# Patient Record
Sex: Female | Born: 1980 | Race: White | Hispanic: No | Marital: Married | Smoking: Never smoker
Health system: Southern US, Community
[De-identification: ages and names within clinical notes are randomized; demographics above are authoritative.]

## PROBLEM LIST (undated history)

## (undated) DIAGNOSIS — K7689 Other specified diseases of liver: Secondary | ICD-10-CM

## (undated) DIAGNOSIS — K5989 Other specified functional intestinal disorders: Secondary | ICD-10-CM

## (undated) DIAGNOSIS — K219 Gastro-esophageal reflux disease without esophagitis: Secondary | ICD-10-CM

## (undated) DIAGNOSIS — Z87440 Personal history of urinary (tract) infections: Secondary | ICD-10-CM

## (undated) DIAGNOSIS — F419 Anxiety disorder, unspecified: Secondary | ICD-10-CM

## (undated) HISTORY — DX: Personal history of urinary (tract) infections: Z87.440

## (undated) HISTORY — DX: Anxiety disorder, unspecified: F41.9

## (undated) HISTORY — DX: Gastro-esophageal reflux disease without esophagitis: K21.9

## (undated) HISTORY — PX: OTHER SURGICAL HISTORY: SHX169

---

## 2008-01-01 ENCOUNTER — Other Ambulatory Visit: Admission: RE | Admit: 2008-01-01 | Discharge: 2008-01-01 | Payer: Self-pay | Admitting: Obstetrics and Gynecology

## 2008-06-06 ENCOUNTER — Ambulatory Visit: Payer: Self-pay | Admitting: Family Medicine

## 2008-06-07 ENCOUNTER — Encounter: Payer: Self-pay | Admitting: Family Medicine

## 2008-06-10 ENCOUNTER — Encounter (INDEPENDENT_AMBULATORY_CARE_PROVIDER_SITE_OTHER): Payer: Self-pay | Admitting: *Deleted

## 2009-04-27 ENCOUNTER — Inpatient Hospital Stay (HOSPITAL_COMMUNITY): Admission: AD | Admit: 2009-04-27 | Discharge: 2009-04-30 | Payer: Self-pay | Admitting: Obstetrics and Gynecology

## 2009-04-27 ENCOUNTER — Encounter (INDEPENDENT_AMBULATORY_CARE_PROVIDER_SITE_OTHER): Payer: Self-pay | Admitting: Obstetrics and Gynecology

## 2009-08-25 ENCOUNTER — Other Ambulatory Visit: Admission: RE | Admit: 2009-08-25 | Discharge: 2009-08-25 | Payer: Self-pay | Admitting: Obstetrics and Gynecology

## 2010-11-20 LAB — CBC
HCT: 29.9 % — ABNORMAL LOW (ref 36.0–46.0)
HCT: 37.4 % (ref 36.0–46.0)
Hemoglobin: 10.2 g/dL — ABNORMAL LOW (ref 12.0–15.0)
MCHC: 33.9 g/dL (ref 30.0–36.0)
MCV: 89.9 fL (ref 78.0–100.0)
RBC: 4.17 MIL/uL (ref 3.87–5.11)
RDW: 13.5 % (ref 11.5–15.5)
WBC: 10.9 10*3/uL — ABNORMAL HIGH (ref 4.0–10.5)

## 2011-06-16 ENCOUNTER — Other Ambulatory Visit: Payer: Self-pay | Admitting: Obstetrics and Gynecology

## 2011-08-17 NOTE — L&D Delivery Note (Signed)
Patient was C/C/+2 and pushed for 70 minutes with epidural.    Pt was exhausted from prolonged labor and VE applied after consent at +3 for 4 ctxes (including outlet) with 3 popoffs. NSVD  female infant, Apgars 8,9, weight 8#2.   The patient had one midline episiotomy and one L sucal  lacerations repaired with 2-0 vicryl R.. Fundus was firm. EBL was expected. Placenta was delivered intact. Vagina was clear.  Baby was vigorous to bedside.  Jehad [name] A

## 2011-09-16 LAB — OB RESULTS CONSOLE GC/CHLAMYDIA: Chlamydia: NEGATIVE

## 2011-10-14 LAB — OB RESULTS CONSOLE RUBELLA ANTIBODY, IGM: Rubella: IMMUNE

## 2011-10-14 LAB — OB RESULTS CONSOLE HIV ANTIBODY (ROUTINE TESTING)
HIV: NONREACTIVE
HIV: NONREACTIVE

## 2011-10-14 LAB — OB RESULTS CONSOLE HEPATITIS B SURFACE ANTIGEN: Hepatitis B Surface Ag: NEGATIVE

## 2011-10-14 LAB — OB RESULTS CONSOLE ABO/RH: RH Type: POSITIVE

## 2011-12-09 ENCOUNTER — Other Ambulatory Visit (HOSPITAL_COMMUNITY): Payer: Self-pay | Admitting: Obstetrics and Gynecology

## 2011-12-09 DIAGNOSIS — Z3689 Encounter for other specified antenatal screening: Secondary | ICD-10-CM

## 2011-12-10 ENCOUNTER — Ambulatory Visit (HOSPITAL_COMMUNITY)
Admission: RE | Admit: 2011-12-10 | Discharge: 2011-12-10 | Disposition: A | Discharge status: Home / Self Care | Payer: 59 | Source: Ambulatory Visit | Attending: Obstetrics and Gynecology | Admitting: Obstetrics and Gynecology

## 2011-12-10 DIAGNOSIS — O358XX0 Maternal care for other (suspected) fetal abnormality and damage, not applicable or unspecified: Secondary | ICD-10-CM | POA: Insufficient documentation

## 2011-12-10 DIAGNOSIS — Z363 Encounter for antenatal screening for malformations: Secondary | ICD-10-CM | POA: Insufficient documentation

## 2011-12-10 DIAGNOSIS — Z3689 Encounter for other specified antenatal screening: Secondary | ICD-10-CM

## 2011-12-10 DIAGNOSIS — Z1389 Encounter for screening for other disorder: Secondary | ICD-10-CM | POA: Insufficient documentation

## 2012-04-15 ENCOUNTER — Encounter (HOSPITAL_COMMUNITY): Payer: Self-pay

## 2012-04-15 ENCOUNTER — Inpatient Hospital Stay (HOSPITAL_COMMUNITY): Payer: 59 | Admitting: Anesthesiology

## 2012-04-15 ENCOUNTER — Encounter (HOSPITAL_COMMUNITY): Payer: Self-pay | Admitting: Anesthesiology

## 2012-04-15 ENCOUNTER — Inpatient Hospital Stay (HOSPITAL_COMMUNITY)
Admission: AD | Admit: 2012-04-15 | Discharge: 2012-04-18 | DRG: 774 | Disposition: A | Discharge status: Home / Self Care | Payer: 59 | Source: Ambulatory Visit | Attending: Obstetrics and Gynecology | Admitting: Obstetrics and Gynecology

## 2012-04-15 DIAGNOSIS — D649 Anemia, unspecified: Secondary | ICD-10-CM | POA: Diagnosis not present

## 2012-04-15 DIAGNOSIS — Z3490 Encounter for supervision of normal pregnancy, unspecified, unspecified trimester: Secondary | ICD-10-CM

## 2012-04-15 DIAGNOSIS — O9903 Anemia complicating the puerperium: Secondary | ICD-10-CM | POA: Diagnosis not present

## 2012-04-15 DIAGNOSIS — O34219 Maternal care for unspecified type scar from previous cesarean delivery: Secondary | ICD-10-CM | POA: Diagnosis present

## 2012-04-15 LAB — CBC
MCH: 28.9 pg (ref 26.0–34.0)
MCHC: 34 g/dL (ref 30.0–36.0)
MCV: 85 fL (ref 78.0–100.0)
Platelets: 219 10*3/uL (ref 150–400)
RBC: 4.26 MIL/uL (ref 3.87–5.11)
RDW: 13.4 % (ref 11.5–15.5)

## 2012-04-15 LAB — TYPE AND SCREEN
ABO/RH(D): O POS
Antibody Screen: NEGATIVE

## 2012-04-15 LAB — POCT FERN TEST

## 2012-04-15 MED ORDER — OXYTOCIN 40 UNITS IN LACTATED RINGERS INFUSION - SIMPLE MED
62.5000 mL/h | Freq: Once | INTRAVENOUS | Status: DC
  Filled 2012-04-15: qty 1000

## 2012-04-15 MED ORDER — BUTORPHANOL TARTRATE 1 MG/ML IJ SOLN: 1.0000 mg | INTRAMUSCULAR | Status: DC | PRN

## 2012-04-15 MED ORDER — LACTATED RINGERS IV SOLN
INTRAVENOUS | Status: DC
  Administered 2012-04-15 (×3): via INTRAVENOUS

## 2012-04-15 MED ORDER — PHENYLEPHRINE 40 MCG/ML (10ML) SYRINGE FOR IV PUSH (FOR BLOOD PRESSURE SUPPORT)
80.0000 ug | PREFILLED_SYRINGE | INTRAVENOUS | Status: DC | PRN
  Filled 2012-04-15: qty 5

## 2012-04-15 MED ORDER — SODIUM BICARBONATE 8.4 % IV SOLN
INTRAVENOUS | Status: DC | PRN
  Administered 2012-04-15: 4 mL via EPIDURAL

## 2012-04-15 MED ORDER — EPHEDRINE 5 MG/ML INJ: 10.0000 mg | INTRAVENOUS | Status: DC | PRN

## 2012-04-15 MED ORDER — FENTANYL 2.5 MCG/ML BUPIVACAINE 1/10 % EPIDURAL INFUSION (WH - ANES)
14.0000 mL/h | INTRAMUSCULAR | Status: DC
  Administered 2012-04-15 – 2012-04-16 (×3): 14 mL/h via EPIDURAL
  Filled 2012-04-15 (×4): qty 60

## 2012-04-15 MED ORDER — FENTANYL 2.5 MCG/ML BUPIVACAINE 1/10 % EPIDURAL INFUSION (WH - ANES)
INTRAMUSCULAR | Status: DC | PRN
  Administered 2012-04-15: 14 mL/h via EPIDURAL

## 2012-04-15 MED ORDER — CITRIC ACID-SODIUM CITRATE 334-500 MG/5ML PO SOLN
30.0000 mL | ORAL | Status: DC | PRN
  Administered 2012-04-15: 30 mL via ORAL
  Filled 2012-04-15: qty 15

## 2012-04-15 MED ORDER — OXYTOCIN 40 UNITS IN LACTATED RINGERS INFUSION - SIMPLE MED
1.0000 m[IU]/min | INTRAVENOUS | Status: DC
  Administered 2012-04-15: 1 m[IU]/min via INTRAVENOUS

## 2012-04-15 MED ORDER — PHENYLEPHRINE 40 MCG/ML (10ML) SYRINGE FOR IV PUSH (FOR BLOOD PRESSURE SUPPORT): 80.0000 ug | PREFILLED_SYRINGE | INTRAVENOUS | Status: DC | PRN

## 2012-04-15 MED ORDER — OXYCODONE-ACETAMINOPHEN 5-325 MG PO TABS: 1.0000 | ORAL_TABLET | ORAL | Status: DC | PRN

## 2012-04-15 MED ORDER — ACETAMINOPHEN 325 MG PO TABS: 650.0000 mg | ORAL_TABLET | ORAL | Status: DC | PRN

## 2012-04-15 MED ORDER — TERBUTALINE SULFATE 1 MG/ML IJ SOLN: 0.2500 mg | Freq: Once | INTRAMUSCULAR | Status: AC | PRN

## 2012-04-15 MED ORDER — EPHEDRINE 5 MG/ML INJ
10.0000 mg | INTRAVENOUS | Status: DC | PRN
  Filled 2012-04-15: qty 4

## 2012-04-15 MED ORDER — FLEET ENEMA 7-19 GM/118ML RE ENEM: 1.0000 | ENEMA | RECTAL | Status: DC | PRN

## 2012-04-15 MED ORDER — OXYTOCIN BOLUS FROM INFUSION
250.0000 mL | Freq: Once | INTRAVENOUS | Status: DC
  Filled 2012-04-15: qty 500

## 2012-04-15 MED ORDER — IBUPROFEN 600 MG PO TABS: 600.0000 mg | ORAL_TABLET | Freq: Four times a day (QID) | ORAL | Status: DC | PRN

## 2012-04-15 MED ORDER — LIDOCAINE HCL (PF) 1 % IJ SOLN
30.0000 mL | INTRAMUSCULAR | Status: DC | PRN
  Filled 2012-04-15: qty 30

## 2012-04-15 MED ORDER — ONDANSETRON HCL 4 MG/2ML IJ SOLN: 4.0000 mg | Freq: Four times a day (QID) | INTRAMUSCULAR | Status: DC | PRN

## 2012-04-15 MED ORDER — DIPHENHYDRAMINE HCL 50 MG/ML IJ SOLN: 12.5000 mg | INTRAMUSCULAR | Status: DC | PRN

## 2012-04-15 MED ORDER — LACTATED RINGERS IV SOLN
500.0000 mL | INTRAVENOUS | Status: DC | PRN
  Administered 2012-04-16: 500 mL via INTRAVENOUS

## 2012-04-15 MED ORDER — LACTATED RINGERS IV SOLN
500.0000 mL | Freq: Once | INTRAVENOUS | Status: AC
  Administered 2012-04-15: 1000 mL via INTRAVENOUS

## 2012-04-15 NOTE — Progress Notes (Signed)
FHTs 120s, gstv, NST R.  SVE 7/C/-2.

## 2012-04-15 NOTE — Anesthesia Preprocedure Evaluation (Signed)
Anesthesia Evaluation  Patient identified by MRN, date of birth, ID band Patient awake    Reviewed: Allergy & Precautions, H&P , Patient's Chart, lab work & pertinent test results  Airway Mallampati: II TM Distance: >3 FB Neck ROM: full    Dental  (+) Teeth Intact   Pulmonary  breath sounds clear to auscultation        Cardiovascular Rhythm:regular Rate:Normal     Neuro/Psych    GI/Hepatic   Endo/Other    Renal/GU      Musculoskeletal   Abdominal   Peds  Hematology   Anesthesia Other Findings  TLAC     Reproductive/Obstetrics (+) Pregnancy                           Anesthesia Physical Anesthesia Plan  ASA: II  Anesthesia Plan: Epidural   Post-op Pain Management:    Induction:   Airway Management Planned:   Additional Equipment:   Intra-op Plan:   Post-operative Plan:   Informed Consent: I have reviewed the patients History and Physical, chart, labs and discussed the procedure including the risks, benefits and alternatives for the proposed anesthesia with the patient or authorized representative who has indicated his/her understanding and acceptance.   Dental Advisory Given  Plan Discussed with:   Anesthesia Plan Comments: (Labs checked- platelets confirmed with RN in room. Fetal heart tracing, per RN, reported to be stable enough for sitting procedure. Discussed epidural, and patient consents to the procedure:  included risk of possible headache,backache, failed block, allergic reaction, and nerve injury. This patient was asked if she had any questions or concerns before the procedure started. )        Anesthesia Quick Evaluation

## 2012-04-15 NOTE — Progress Notes (Signed)
RN in room during variable. Pt changing positions from right lateral to Semi-Fowlers. Fetal heart rate back to baseline after RN adjusted Cardio.

## 2012-04-15 NOTE — H&P (Signed)
31 y.o. [redacted]w[redacted]d  G2P1 comes in c/o SROM at 0100.  Otherwise has good fetal movement and no bleeding.  Desires TOL.  History reviewed. No pertinent past medical history.  Past Surgical History  Procedure Date  . Cesarean section     OB History    Grav Para Term Preterm Abortions TAB SAB Ect Mult Living   2 1        1      # Outc Date GA Lbr Len/2nd Wgt Sex Del Anes PTL Lv   1 CUR            2 PAR      LVCS         History   Social History  . Marital Status: Married    Spouse Name: N/A    Number of Children: N/A  . Years of Education: N/A   Occupational History  . Not on file.   Social History Main Topics  . Smoking status: Never Smoker   . Smokeless tobacco: Not on file  . Alcohol Use: No  . Drug Use: No  . Sexually Active: Yes   Other Topics Concern  . Not on file   Social History Narrative  . No narrative on file   Erythromycin; Tetracycline; Other; and Sulfonamide derivatives   Prenatal Course:  Uncomplicated.  Filed Vitals:   04/15/12 0738  BP: 109/68  Pulse: 84  Temp: 98.1 F (36.7 C)  Resp: 18     Lungs/Cor:  NAD Abdomen:  soft, gravid Ex:  no cords, erythema SVE:  1.5/thick/high FHTs:  120, good STV, NST R Toco:  q7-8   A/P   TOL at term with SROM; add pitocin for augmentation.  All risks d/w pt and she desires to proceed.  GBS neg.  Trudy Kory A

## 2012-04-15 NOTE — MAU Note (Signed)
Positive fern slide.  

## 2012-04-15 NOTE — MAU Note (Signed)
Patient states is having uncontrollable urination

## 2012-04-15 NOTE — Anesthesia Procedure Notes (Signed)
Epidural Patient location during procedure: OB  Preanesthetic Checklist Completed: patient identified, site marked, surgical consent, pre-op evaluation, timeout performed, IV checked, risks and benefits discussed and monitors and equipment checked  Epidural Patient position: sitting Prep: site prepped and draped and DuraPrep Patient monitoring: continuous pulse ox and blood pressure Approach: midline Injection technique: LOR air  Needle:  Needle type: Tuohy  Needle gauge: 17 G Needle length: 9 cm and 9 Needle insertion depth: 6 cm Catheter type: closed end flexible Catheter size: 19 Gauge Catheter at skin depth: 11 cm Test dose: negative  Assessment Events: blood not aspirated, injection not painful, no injection resistance, negative IV test and no paresthesia  Additional Notes Dosing of Epidural:  1st dose, through needle ............................................Marland Kitchen epi 1:200K + Xylocaine 40 mg  2nd dose, through catheter, after waiting 3 minutes...Marland KitchenMarland Kitchenepi 1:200K + Xylocaine 40 mg  3rd dose, through catheter after waiting 3 minutes .............................Marcaine   4mg    ( mg Marcaine are expressed as equivilent  cc's medication removed from the 0.1%Bupiv / fentanyl syringe from L&D pump)  ( 2% Xylo charted as a single dose in Epic Meds for ease of charting; actual dosing was fractionated as above, for saftey's sake)  As each dose occurred, patient was free of IV sx; and patient exhibited no evidence of SA injection.  Patient is more comfortable after epidural dosed. Please see RN's note for documentation of vital signs,and FHR which are stable.  Patient reminded not to try to ambulate with numb legs, and that an RN must be present the 1st time she attempts to get up.

## 2012-04-15 NOTE — Progress Notes (Signed)
3/C/-2; FHTs reassuring.

## 2012-04-15 NOTE — Plan of Care (Signed)
Problem: Consults Goal: Birthing Suites Patient Information Press F2 to bring up selections list Outcome: Completed/Met Date Met:  04/15/12  Pt 37-[redacted] weeks EGA  Comments:  39.0 wks

## 2012-04-16 ENCOUNTER — Encounter (HOSPITAL_COMMUNITY): Payer: Self-pay | Admitting: *Deleted

## 2012-04-16 MED ORDER — ACETAMINOPHEN 500 MG PO TABS
1000.0000 mg | ORAL_TABLET | Freq: Once | ORAL | Status: AC
  Administered 2012-04-16: 1000 mg via ORAL

## 2012-04-16 MED ORDER — SODIUM CHLORIDE 0.9 % IV SOLN
2.0000 g | Freq: Once | INTRAVENOUS | Status: AC
  Administered 2012-04-16: 2 g via INTRAVENOUS
  Filled 2012-04-16: qty 2000

## 2012-04-16 MED ORDER — METHYLERGONOVINE MALEATE 0.2 MG/ML IJ SOLN: 0.2000 mg | INTRAMUSCULAR | Status: DC | PRN

## 2012-04-16 MED ORDER — SODIUM CHLORIDE 0.9 % IV SOLN: 250.0000 mL | INTRAVENOUS | Status: DC | PRN

## 2012-04-16 MED ORDER — OXYCODONE-ACETAMINOPHEN 5-325 MG PO TABS
1.0000 | ORAL_TABLET | ORAL | Status: DC | PRN
  Administered 2012-04-17 (×3): 1 via ORAL
  Filled 2012-04-16 (×3): qty 1

## 2012-04-16 MED ORDER — WITCH HAZEL-GLYCERIN EX PADS: 1.0000 "application " | MEDICATED_PAD | CUTANEOUS | Status: DC | PRN

## 2012-04-16 MED ORDER — ZOLPIDEM TARTRATE 5 MG PO TABS: 5.0000 mg | ORAL_TABLET | Freq: Every evening | ORAL | Status: DC | PRN

## 2012-04-16 MED ORDER — SODIUM CHLORIDE 0.9 % IJ SOLN
3.0000 mL | Freq: Two times a day (BID) | INTRAMUSCULAR | Status: DC
Start: 2012-04-16 — End: 2012-04-18

## 2012-04-16 MED ORDER — SENNOSIDES-DOCUSATE SODIUM 8.6-50 MG PO TABS
2.0000 | ORAL_TABLET | Freq: Every day | ORAL | Status: DC
  Administered 2012-04-16 – 2012-04-17 (×2): 2 via ORAL

## 2012-04-16 MED ORDER — SIMETHICONE 80 MG PO CHEW: 80.0000 mg | CHEWABLE_TABLET | ORAL | Status: DC | PRN

## 2012-04-16 MED ORDER — MEASLES, MUMPS & RUBELLA VAC ~~LOC~~ INJ
0.5000 mL | INJECTION | Freq: Once | SUBCUTANEOUS | Status: DC
  Filled 2012-04-16: qty 0.5

## 2012-04-16 MED ORDER — FERROUS SULFATE 325 (65 FE) MG PO TABS
325.0000 mg | ORAL_TABLET | Freq: Two times a day (BID) | ORAL | Status: DC
  Administered 2012-04-16 – 2012-04-18 (×5): 325 mg via ORAL
  Filled 2012-04-16 (×5): qty 1

## 2012-04-16 MED ORDER — IBUPROFEN 800 MG PO TABS
800.0000 mg | ORAL_TABLET | Freq: Three times a day (TID) | ORAL | Status: DC
  Administered 2012-04-16 – 2012-04-18 (×7): 800 mg via ORAL
  Filled 2012-04-16 (×9): qty 1

## 2012-04-16 MED ORDER — ONDANSETRON HCL 4 MG/2ML IJ SOLN: 4.0000 mg | INTRAMUSCULAR | Status: DC | PRN

## 2012-04-16 MED ORDER — DIBUCAINE 1 % RE OINT: 1.0000 "application " | TOPICAL_OINTMENT | RECTAL | Status: DC | PRN

## 2012-04-16 MED ORDER — DIPHENHYDRAMINE HCL 25 MG PO CAPS: 25.0000 mg | ORAL_CAPSULE | Freq: Four times a day (QID) | ORAL | Status: DC | PRN

## 2012-04-16 MED ORDER — PRENATAL MULTIVITAMIN CH
1.0000 | ORAL_TABLET | Freq: Every day | ORAL | Status: DC
  Administered 2012-04-16 – 2012-04-18 (×3): 1 via ORAL
  Filled 2012-04-16 (×3): qty 1

## 2012-04-16 MED ORDER — LANOLIN HYDROUS EX OINT: TOPICAL_OINTMENT | CUTANEOUS | Status: DC | PRN

## 2012-04-16 MED ORDER — METHYLERGONOVINE MALEATE 0.2 MG PO TABS: 0.2000 mg | ORAL_TABLET | ORAL | Status: DC | PRN

## 2012-04-16 MED ORDER — MAGNESIUM HYDROXIDE 400 MG/5ML PO SUSP: 30.0000 mL | ORAL | Status: DC | PRN

## 2012-04-16 MED ORDER — SODIUM CHLORIDE 0.9 % IJ SOLN: 3.0000 mL | INTRAMUSCULAR | Status: DC | PRN

## 2012-04-16 MED ORDER — BENZOCAINE-MENTHOL 20-0.5 % EX AERO
1.0000 "application " | INHALATION_SPRAY | CUTANEOUS | Status: DC | PRN
  Administered 2012-04-16: 1 via TOPICAL
  Filled 2012-04-16 (×2): qty 56

## 2012-04-16 MED ORDER — ONDANSETRON HCL 4 MG PO TABS: 4.0000 mg | ORAL_TABLET | ORAL | Status: DC | PRN

## 2012-04-16 MED ORDER — TETANUS-DIPHTH-ACELL PERTUSSIS 5-2.5-18.5 LF-MCG/0.5 IM SUSP: 0.5000 mL | Freq: Once | INTRAMUSCULAR | Status: DC

## 2012-04-16 NOTE — Progress Notes (Signed)
Reported to Dr. Henderson Cloud UC pattern, FHR and station unchanged. MD aware. Orders to decrease Pitocin to half 7mu and then start increasing again per orders.

## 2012-04-16 NOTE — Progress Notes (Signed)
Orders to increase Pitocin at this time to 11mu

## 2012-04-16 NOTE — Anesthesia Postprocedure Evaluation (Signed)
Anesthesia Post Note  Patient: Stacy [name]  Procedure(s) Performed: * No procedures listed *  Anesthesia type: Epidural  Patient location: Mother/Baby  Post pain: Pain level controlled  Post assessment: Post-op Vital signs reviewed  Last Vitals:  Filed Vitals:   04/16/12 0745  BP: 98/65  Pulse: 109  Temp: 36.8 C  Resp: 20    Post vital signs: Reviewed  Level of consciousness: awake  Complications: No apparent anesthesia complications

## 2012-04-16 NOTE — Progress Notes (Signed)
Pt had fever of 102 in labor.  Received Amp and tylenol.  Will observe for fever pp.

## 2012-04-16 NOTE — Progress Notes (Signed)
Called Dr. Henderson Cloud with update. MD notified of FHR baseline change to 185's with moderate variability and accelerations. No decels. Temp 100.0 orally, 100.2 ax.  SVE with no change in station. Interventions used by RN.  MD aware. Orders received for Tylenol 1gm now and Ampicillin 2gm IV. Position pt to high fowlers and recheck in 30 min. Orders read back and verified.

## 2012-04-16 NOTE — Progress Notes (Signed)
Assisted patient to the restroom tolerated well, upon standing to return to bed, patient stated she did not feel good, had patient sit again on toilet and called for assistance, patient safely returned to bed.

## 2012-04-16 NOTE — Progress Notes (Signed)
Called Dr. Henderson Cloud- She is in the building- coming to evaluate pt.

## 2012-04-16 NOTE — Progress Notes (Signed)
Out of the room- Rn pushing with pt

## 2012-04-17 LAB — CBC
HCT: 24.7 % — ABNORMAL LOW (ref 36.0–46.0)
Hemoglobin: 8.3 g/dL — ABNORMAL LOW (ref 12.0–15.0)
MCH: 28.7 pg (ref 26.0–34.0)
MCHC: 33.6 g/dL (ref 30.0–36.0)
RDW: 13.8 % (ref 11.5–15.5)

## 2012-04-17 NOTE — Progress Notes (Signed)
Patient is eating, ambulating, voiding.  Pain control is good.  Pt reports bleeding still somewhat heavy but improved, changed a pad every 1.5 hrs.  She denies dizziness today, one episode with first time out of bed yesterday.    Filed Vitals:   04/16/12 0912 04/16/12 1113 04/16/12 2000 04/17/12 0538  BP: 95/62 94/63 104/70 91/59  Pulse: 83 90 92 92  Temp:  98.2 F (36.8 C) 98 F (36.7 C) 98.1 F (36.7 C)  TempSrc:  Oral  Oral  Resp: 20 18 18 18   Height:      Weight:      SpO2:        Fundus firm No CT  Lab Results  Component Value Date   WBC 15.3* 04/17/2012   HGB 8.3* 04/17/2012   HCT 24.7* 04/17/2012   MCV 85.5 04/17/2012   PLT 201 04/17/2012    --/--/O POS, O POS (08/31 0310)/RI  A/P Post partum day 1. Anemia - FeSO4 bid, monitor bleeding  Routine care.  Expect d/c 9/3.    Philip Aspen

## 2012-04-18 NOTE — Discharge Instructions (Signed)
Vaginal Delivery Care After  Change your pad on each trip to the bathroom.   Wipe gently with toilet paper during your hospital stay. Always wipe from front to back. A spray bottle with warm tap water could also be used or a towelette if available.   Place your soiled pad and toilet paper in a bathroom wastebasket with a plastic bag liner.   During your hospital stay, save any clots. If you pass a clot while on the toilet, do not flush it. Also, if your vaginal flow seems excessive to you, notify nursing personnel.   The first time you get out of bed after delivery, wait for assistance from a nurse. Do not get up alone at any time if you feel weak or dizzy.   Bend and extend your ankles forcefully so that you feel the calves of your legs get hard. Do this 6 times every hour when you are in bed and awake.   Do not sit with one foot under you, dangle your legs over the edge of the bed, or maintain a position that hinders the circulation in your legs.   Many women experience after pains for 2 to 3 days after delivery. These after pains are mild uterine contractions. Ask the nurse for a pain medication if you need something for this. Sometimes breastfeeding stimulates after pains; if you find this to be true, ask for the medication  -  hour before the next feeding.   For you and your infant's protection, do not go beyond the door(s) of the obstetric unit. Do not carry your baby in your arms in the hallway. When taking your baby to and from your room, put your baby in the bassinet and push the bassinet.   Mothers may have their babies in their room as much as they desire.  Document Released: 07/30/2000 Document Revised: 07/22/2011 Document Reviewed: 06/30/2007 Exit[employer] Patient Information 2012 [employer]. 

## 2012-04-18 NOTE — Discharge Summary (Signed)
Obstetric Discharge Summary Reason for Admission: rupture of membranes Prenatal Procedures: ultrasound Intrapartum Procedures: vacuum, episiotomy midline and repair of left sulcus tear Postpartum Procedures: none Complications-Operative and Postpartum: vaginal laceration(left sulcus tear) Hemoglobin  Date Value Range Status  04/17/2012 8.3* 12.0 - 15.0 g/dL Final     HCT  Date Value Range Status  04/17/2012 24.7* 36.0 - 46.0 % Final    Physical Exam:  General: alert Lochia: appropriate Uterine Fundus: firm  Discharge Diagnoses: Term Pregnancy-delivered  Discharge Information: Date: 04/18/2012 Activity: pelvic rest Diet: routine Medications: PNV and Ibuprofen Condition: stable Instructions: refer to practice specific booklet Discharge to: home Follow-up Information    Follow up with HORVATH,MICHELLE A, MD. Schedule an appointment as soon as possible for a visit in 4 weeks.   Contact information:   [address] [place] [address] [phone]          Newborn Data: Live born female  Birth Weight: 8 lb 2.9 oz (3710 g) APGAR: 8, 9  Home with mother.  Loyce Klasen D 04/18/2012, 9:16 AM

## 2013-06-26 ENCOUNTER — Other Ambulatory Visit: Payer: Self-pay

## 2013-07-27 ENCOUNTER — Inpatient Hospital Stay (HOSPITAL_BASED_OUTPATIENT_CLINIC_OR_DEPARTMENT_OTHER)
Admission: EM | Admit: 2013-07-27 | Discharge: 2013-07-28 | DRG: 392 | Disposition: A | Discharge status: Home / Self Care | Payer: BC Managed Care – PPO | Attending: Internal Medicine | Admitting: Internal Medicine

## 2013-07-27 ENCOUNTER — Inpatient Hospital Stay (HOSPITAL_COMMUNITY)
Admission: RE | Admit: 2013-07-27 | Payer: Self-pay | Source: Other Acute Inpatient Hospital | Admitting: Internal Medicine

## 2013-07-27 ENCOUNTER — Emergency Department (HOSPITAL_BASED_OUTPATIENT_CLINIC_OR_DEPARTMENT_OTHER): Payer: BC Managed Care – PPO

## 2013-07-27 ENCOUNTER — Encounter (HOSPITAL_BASED_OUTPATIENT_CLINIC_OR_DEPARTMENT_OTHER): Payer: Self-pay | Admitting: Emergency Medicine

## 2013-07-27 DIAGNOSIS — K5900 Constipation, unspecified: Secondary | ICD-10-CM | POA: Diagnosis present

## 2013-07-27 DIAGNOSIS — I951 Orthostatic hypotension: Secondary | ICD-10-CM | POA: Diagnosis present

## 2013-07-27 DIAGNOSIS — Z23 Encounter for immunization: Secondary | ICD-10-CM

## 2013-07-27 DIAGNOSIS — Z833 Family history of diabetes mellitus: Secondary | ICD-10-CM

## 2013-07-27 DIAGNOSIS — D134 Benign neoplasm of liver: Secondary | ICD-10-CM

## 2013-07-27 DIAGNOSIS — Z8249 Family history of ischemic heart disease and other diseases of the circulatory system: Secondary | ICD-10-CM

## 2013-07-27 DIAGNOSIS — R112 Nausea with vomiting, unspecified: Secondary | ICD-10-CM

## 2013-07-27 DIAGNOSIS — E860 Dehydration: Secondary | ICD-10-CM

## 2013-07-27 DIAGNOSIS — R739 Hyperglycemia, unspecified: Secondary | ICD-10-CM | POA: Diagnosis present

## 2013-07-27 DIAGNOSIS — R7309 Other abnormal glucose: Secondary | ICD-10-CM | POA: Diagnosis present

## 2013-07-27 DIAGNOSIS — E861 Hypovolemia: Secondary | ICD-10-CM | POA: Diagnosis present

## 2013-07-27 DIAGNOSIS — I959 Hypotension, unspecified: Secondary | ICD-10-CM

## 2013-07-27 DIAGNOSIS — E876 Hypokalemia: Secondary | ICD-10-CM | POA: Diagnosis present

## 2013-07-27 DIAGNOSIS — R1115 Cyclical vomiting syndrome unrelated to migraine: Principal | ICD-10-CM | POA: Diagnosis present

## 2013-07-27 DIAGNOSIS — J029 Acute pharyngitis, unspecified: Secondary | ICD-10-CM

## 2013-07-27 LAB — COMPREHENSIVE METABOLIC PANEL
Alkaline Phosphatase: 85 U/L (ref 39–117)
BUN: 16 mg/dL (ref 6–23)
Creatinine, Ser: 0.6 mg/dL (ref 0.50–1.10)
GFR calc Af Amer: >90 mL/min (ref >90)
Glucose, Bld: 144 mg/dL — ABNORMAL HIGH (ref 70–99)
Potassium: 4.1 mEq/L (ref 3.5–5.1)
Total Bilirubin: 1.3 mg/dL — ABNORMAL HIGH (ref 0.3–1.2)
Total Protein: 8.4 g/dL — ABNORMAL HIGH (ref 6.0–8.3)

## 2013-07-27 LAB — URINALYSIS, ROUTINE W REFLEX MICROSCOPIC
Glucose, UA: NEGATIVE mg/dL
Hgb urine dipstick: NEGATIVE
Ketones, ur: >80 mg/dL — AB
Protein, ur: 100 mg/dL — AB
Urobilinogen, UA: 1 mg/dL (ref 0.0–1.0)

## 2013-07-27 LAB — CBC WITH DIFFERENTIAL/PLATELET
Eosinophils Absolute: 0 10*3/uL (ref 0.0–0.7)
HCT: 42.3 % (ref 36.0–46.0)
Hemoglobin: 14.4 g/dL (ref 12.0–15.0)
Lymphs Abs: 0.7 10*3/uL (ref 0.7–4.0)
MCH: 28.7 pg (ref 26.0–34.0)
MCV: 84.3 fL (ref 78.0–100.0)
Monocytes Absolute: 0.6 10*3/uL (ref 0.1–1.0)
Monocytes Relative: 5 % (ref 3–12)
Neutrophils Relative %: 89 % — ABNORMAL HIGH (ref 43–77)
RBC: 5.02 MIL/uL (ref 3.87–5.11)

## 2013-07-27 LAB — URINE MICROSCOPIC-ADD ON

## 2013-07-27 LAB — PREGNANCY, URINE: Preg Test, Ur: NEGATIVE

## 2013-07-27 LAB — LIPASE, BLOOD: Lipase: 28 U/L (ref 11–59)

## 2013-07-27 MED ORDER — IOHEXOL 300 MG/ML  SOLN
100.0000 mL | Freq: Once | INTRAMUSCULAR | Status: AC | PRN
  Administered 2013-07-27: 100 mL via INTRAVENOUS

## 2013-07-27 MED ORDER — SODIUM CHLORIDE 0.9 % IJ SOLN
3.0000 mL | Freq: Two times a day (BID) | INTRAMUSCULAR | Status: DC
  Administered 2013-07-27: 22:00:00 3 mL via INTRAVENOUS

## 2013-07-27 MED ORDER — INSULIN ASPART 100 UNIT/ML ~~LOC~~ SOLN: 0.0000 [IU] | SUBCUTANEOUS | Status: DC

## 2013-07-27 MED ORDER — ZOLPIDEM TARTRATE 5 MG PO TABS: 5.0000 mg | ORAL_TABLET | Freq: Every evening | ORAL | Status: DC | PRN

## 2013-07-27 MED ORDER — ONDANSETRON HCL 4 MG/2ML IJ SOLN: 4.0000 mg | Freq: Four times a day (QID) | INTRAMUSCULAR | Status: DC | PRN

## 2013-07-27 MED ORDER — ALUM & MAG HYDROXIDE-SIMETH 200-200-20 MG/5ML PO SUSP: 30.0000 mL | Freq: Four times a day (QID) | ORAL | Status: DC | PRN

## 2013-07-27 MED ORDER — SODIUM CHLORIDE 0.9 % IV SOLN
INTRAVENOUS | Status: DC
  Administered 2013-07-27: 20:00:00 via INTRAVENOUS

## 2013-07-27 MED ORDER — ACETAMINOPHEN 650 MG RE SUPP: 650.0000 mg | Freq: Four times a day (QID) | RECTAL | Status: DC | PRN

## 2013-07-27 MED ORDER — DEXTROSE 5 % IV BOLUS
1000.0000 mL | Freq: Once | INTRAVENOUS | Status: AC
  Administered 2013-07-27: 1000 mL via INTRAVENOUS

## 2013-07-27 MED ORDER — ENOXAPARIN SODIUM 40 MG/0.4ML ~~LOC~~ SOLN
40.0000 mg | SUBCUTANEOUS | Status: DC
  Administered 2013-07-27: 22:00:00 40 mg via SUBCUTANEOUS
  Filled 2013-07-27 (×2): qty 0.4

## 2013-07-27 MED ORDER — OMEPRAZOLE 20 MG PO CPDR: 20.0000 mg | DELAYED_RELEASE_CAPSULE | Freq: Every day | ORAL | Status: DC

## 2013-07-27 MED ORDER — SODIUM CHLORIDE 0.9 % IV SOLN
INTRAVENOUS | Status: DC
  Administered 2013-07-27 – 2013-07-28 (×2): via INTRAVENOUS

## 2013-07-27 MED ORDER — ONDANSETRON HCL 4 MG/2ML IJ SOLN: 4.0000 mg | Freq: Three times a day (TID) | INTRAMUSCULAR | Status: DC | PRN

## 2013-07-27 MED ORDER — SODIUM CHLORIDE 0.9 % IV BOLUS (SEPSIS)
1000.0000 mL | Freq: Once | INTRAVENOUS | Status: AC
  Administered 2013-07-27: 1000 mL via INTRAVENOUS

## 2013-07-27 MED ORDER — OXYCODONE HCL 5 MG PO TABS: 5.0000 mg | ORAL_TABLET | ORAL | Status: DC | PRN

## 2013-07-27 MED ORDER — ONDANSETRON HCL 4 MG/2ML IJ SOLN
4.0000 mg | Freq: Once | INTRAMUSCULAR | Status: AC
  Administered 2013-07-27: 4 mg via INTRAVENOUS
  Filled 2013-07-27: qty 2

## 2013-07-27 MED ORDER — FENTANYL CITRATE 0.05 MG/ML IJ SOLN: 25.0000 ug | INTRAMUSCULAR | Status: DC | PRN

## 2013-07-27 MED ORDER — ACETAMINOPHEN 325 MG PO TABS: 650.0000 mg | ORAL_TABLET | Freq: Four times a day (QID) | ORAL | Status: DC | PRN

## 2013-07-27 MED ORDER — ONDANSETRON HCL 4 MG PO TABS: 4.0000 mg | ORAL_TABLET | Freq: Four times a day (QID) | ORAL | Status: DC | PRN

## 2013-07-27 MED ORDER — ONDANSETRON HCL 4 MG PO TABS: 4.0000 mg | ORAL_TABLET | Freq: Three times a day (TID) | ORAL | Status: DC | PRN

## 2013-07-27 MED ORDER — IOHEXOL 300 MG/ML  SOLN
50.0000 mL | Freq: Once | INTRAMUSCULAR | Status: AC | PRN
  Administered 2013-07-27: 50 mL via ORAL

## 2013-07-27 MED ORDER — NORGESTIM-ETH ESTRAD TRIPHASIC 0.18/0.215/0.25 MG-35 MCG PO TABS
1.0000 | ORAL_TABLET | Freq: Every day | ORAL | Status: DC
  Administered 2013-07-28: 1 via ORAL

## 2013-07-27 NOTE — ED Provider Notes (Signed)
CSN: [id number]     Arrival date & time 07/27/13  4098 History   First MD Initiated Contact with Patient 07/27/13 [phone]     Chief Complaint  Patient presents with  . abdominal pain and vomiting xray showed " excessive stool"    (Consider location/radiation/quality/duration/timing/severity/associated sxs/prior Treatment) HPI Comments: Stacy [name] is a 32 year old previously healthy woman presenting with a 2 week history of abdominal pain.   She says the pain began right after Thanksgiving and describes it as 9/10, sharp and intermittent.  The pain is epigastric and associated symptoms include N/V and constipation.  She has been vomiting every few days and her BMs occur every other day.  She initially went to urgent care and xray revealed stool in the colon.  She reports being prescribed a clear liquid diet and anti-emetic.  The symptoms persisted so she returned to urgent care 3 days ago.  At that time she was given Align.  Her symptoms have worsened since midnight last night.  She reports vomiting every 30-45 minutes since last night.  The po Zofran did not help so she stopped taking it.  She also began to experience chills last night but was afebrile on home thermometer.  She denies headache, weakness or sick contacts.  She ate lunch at a chain restaurant yesterday prior to her symptoms intensifying.    She has eaten at this restaurant before without problem.   History reviewed. No pertinent past medical history. Past Surgical History  Procedure Laterality Date  . Cesarean section     History reviewed. No pertinent family history. History  Substance Use Topics  . Smoking status: Never Smoker   . Smokeless tobacco: Not on file  . Alcohol Use: No   OB History   Grav Para Term Preterm Abortions TAB SAB Ect Mult Living   2 2 1       2      Review of Systems  Constitutional: Positive for chills. Negative for fever.  Respiratory: Negative for shortness of breath.   Cardiovascular: Negative  for chest pain.  Gastrointestinal: Positive for nausea, vomiting, abdominal pain and constipation. Negative for diarrhea and blood in stool.  Genitourinary: Negative for dysuria.  Neurological: Negative for weakness.    Allergies  Erythromycin; Tetracycline; Other; and Sulfonamide derivatives  Home Medications   Current Outpatient Rx  Name  Route  Sig  Dispense  Refill  . omeprazole (PRILOSEC) 20 MG capsule   Oral   Take 1 capsule (20 mg total) by mouth daily.   30 capsule   1   . ondansetron (ZOFRAN) 4 MG tablet   Oral   Take 1 tablet (4 mg total) by mouth every 8 (eight) hours as needed for nausea or vomiting.   21 tablet   0   . OVER THE COUNTER MEDICATION   Oral   Take 1 tablet by mouth daily. Patient takes Vitamin B for nausea         . Prenatal Vit-Fe Fumarate-FA (PRENATAL MULTIVITAMIN) TABS   Oral   Take 1 tablet by mouth daily.          BP 93/58  Pulse 123  Temp(Src) 97.7 F (36.5 C) (Oral)  Resp 16  Ht 5\' 3"  (1.6 m)  Wt 112 lb (50.803 kg)  BMI 19.84 kg/m2  SpO2 95%  LMP 07/08/2013 Physical Exam  Constitutional: She is oriented to person, place, and time. She appears well-developed and well-nourished. No distress.  HENT:  Head: Normocephalic and atraumatic.  Mouth/Throat: Oropharynx is clear and moist. No oropharyngeal exudate.  Eyes: EOM are normal. Pupils are equal, round, and reactive to light.  Neck: Neck supple.  Cardiovascular: Regular rhythm and normal heart sounds.   tachycardic  Pulmonary/Chest: Effort normal and breath sounds normal. No respiratory distress. She has no wheezes. She has no rales.  Abdominal: Soft. Bowel sounds are normal. She exhibits no distension. There is no rebound and no guarding.  Mildly TTP in epigastric area   Musculoskeletal: Normal range of motion. She exhibits no edema and no tenderness.  Neurological: She is alert and oriented to person, place, and time. No cranial nerve deficit.  Skin: Skin is warm. She is  not diaphoretic.  Psychiatric: She has a normal mood and affect. Her behavior is normal.    ED Course  Procedures (including critical care time) Labs Review Labs Reviewed  URINALYSIS, ROUTINE W REFLEX MICROSCOPIC - Abnormal; Notable for the following:    Color, Urine ORANGE (*)    APPearance CLOUDY (*)    Specific Gravity, Urine >1.046 (*)    Bilirubin Urine MODERATE (*)    Ketones, ur >80 (*)    Protein, ur 100 (*)    Leukocytes, UA SMALL (*)    All other components within normal limits  URINE MICROSCOPIC-ADD ON - Abnormal; Notable for the following:    Squamous Epithelial / LPF FEW (*)    Bacteria, UA MANY (*)    Casts GRANULAR CAST (*)    All other components within normal limits  CBC WITH DIFFERENTIAL - Abnormal; Notable for the following:    WBC 12.0 (*)    Neutrophils Relative % 89 (*)    Neutro Abs 10.7 (*)    Lymphocytes Relative 6 (*)    All other components within normal limits  COMPREHENSIVE METABOLIC PANEL - Abnormal; Notable for the following:    Glucose, Bld 144 (*)    Total Protein 8.4 (*)    Total Bilirubin 1.3 (*)    All other components within normal limits  URINE CULTURE  PREGNANCY, URINE  LIPASE, BLOOD   Imaging Review US Abdomen Complete  07/27/2013   CLINICAL DATA:  Mid abdominal pain with nausea and vomiting.  EXAM: ULTRASOUND ABDOMEN COMPLETE  COMPARISON:  None.  FINDINGS: Gallbladder:  No gallstones or wall thickening visualized. No sonographic Murphy sign noted.  Common bile duct:  There is slight prominence of the intrahepatic bile ducts. Common bile duct is 4.5 mm in diameter.  Liver:  There is a 5.7 x 6.2 x 5.2 cm isoechoic mass at the inferior tip of the left lobe of the liver. This could represent a hepatic adenoma or focal nodular hyperplasia. There is increased echogenicity of the vessel walls as well as of the bile duct walls. This is nonspecific but can be seen with viral illnesses such as hepatitis.  IVC:  The visualized portion is normal.   Pancreas:  Normal.  Spleen:  Normal.  5.5 cm in length.  Right Kidney:  Length: 10.9 cm. Echogenicity within normal limits. No mass or hydronephrosis visualized.  Left Kidney:  Length: 10.0 cm. Echogenicity within normal limits. No mass or hydronephrosis visualized.  Abdominal aorta:  Normal.  1.8 cm maximum diameter.  Other findings:  None.  IMPRESSION: 1. 6.2 cm mass at the inferior aspect of the left lobe of the liver. In a female of this age the most likely etiology is hepatic adenoma or focal nodular hyperplasia. 2. Echogenic vessel walls and bile duct walls in the  liver, nonspecific but this can be seen with viral illnesses such as hepatitis. There is slight intrahepatic ductal prominence. Is the patient's bilirubin elevated?   Electronically Signed   By: Geanie Cooley M.D.   On: 07/27/2013 09:57   Ct Abdomen Pelvis W Contrast  07/27/2013   CLINICAL DATA:  Three day history of vomiting.  EXAM: CT ABDOMEN AND PELVIS WITH CONTRAST  TECHNIQUE: Multidetector CT imaging of the abdomen and pelvis was performed using the standard protocol following bolus administration of intravenous contrast.  CONTRAST:  100 cc OMNIPAQUE IOHEXOL 300 MG/ML SOLN, the patient could not tolerate oral contrast.  COMPARISON:  Abdominal ultrasound of today's date.  FINDINGS: The stomach is partially distended with oral contrast. Contrast extends into the upper small bowel. The distal small bowel loops appear mildly distended with non contrasted fluid. The colon contains stool and fluid down to the level of the rectum. The cecum is moderately distended with fluid and measures as much as 8 cm in greatest dimension There is no evidence of mural thickening. No free extraluminal gas or fluid collections are demonstrated.  There is a large homogeneously enhancing hyperdense mass in the left hepatic lobe inferiorly which was described on the earlier ultrasound. It measures 5.8 cm AP x 7.2 cm transversely x 6.1 cm in superior to inferior  dimension. No other hepatic masses are demonstrated. There is no intrahepatic ductal dilation. The gallbladder is adequately distended with no evidence of stones. The spleen is not enlarged. There are no adrenal masses. The kidneys enhance well. The caliber of the abdominal aorta is normal. No periaortic or pericaval or intraperitoneal lymph nodes are demonstrated.  Within the pelvis the partially distended urinary bladder is grossly normal. The uterus and adnexal structures exhibit no acute abnormalities. There may be cysts associated with the right ovary. There is no umbilical nor inguinal hernia.  The lung bases exhibit no acute abnormality. The lumbar vertebral bodies are preserved in height. The bony pelvis exhibits no acute abnormality.  IMPRESSION: 1. There is mild distention of much of the colon with fluid and some stool and gas. The pattern suggests a diarrheal type process. Portions of the distal small bowel appear mildly dilated with fluid as well. There is no evidence of obstruction currently. There is no evidence of acute appendicitis. 2. There is a known enhancing mass in the left hepatic lobe which likely reflects an hepatic adenoma. No other hepatic masses are demonstrated. No acute abnormality of the gallbladder or pancreas or spleen is demonstrated. 3. There is no intra-abdominal or pelvic lymphadenopathy. There is no evidence of an abscess nor free fluid nor free air.   Electronically Signed   By: David  [place]   On: 07/27/2013 12:36    EKG Interpretation   None       MDM  32 year old previously healthy woman presenting with a 2 week history of epigastric pain and N/V.  Symptoms worse since last night and she cannot keep solids or liquids down.  Differential includes pancreatitis, biliary colic, cholecystitis, PUD, SBO.  Will provide fluids and symptom control in the ED.  Will obtain upreg, UA, CMP, CBC, abdominal US.  Abdominal US reveals mass on liver consistent with hepatic  adenoma vs. FNH; no gallstones on [place].  Will obtain CT abd/pelvis.  CT shows hepatic mass in the left hepatic lobe, likely hepatic adenoma.  No evidence of appendicitis or obstruction.  Gallbladder, spleen and pancreas without acute abnormality.  Mild distention of colon with  fluid and some stool and gas (suggestive of diarrheal process).  Patient's N/V has an unclear etiology.  GI was contacted regarding the hepatic adenoma.  They suggest no change to patient's OCP regimen at this time and she should follow-up with hepatologist.  Once she is stable for discharge will send out with Zofran and omeprazole.  The patient would like to try clear diet and advance as tolerated at home.  She agrees to return to the ED if her vomiting recurs.  She agrees to follow-up with [facility] regarding her N/V.  She agrees to follow-up with hepatologist regarding her hepatic mass.  She continues to be hypotensive (90s/60s) and tachycardic (120s) despite 1L NSS and 1L D5 bolus in the ED.  Starting 3rd L (NSS) and patient has agreed to admission.  Consult to internal medicine for admission to [facility].  Evelena Peat, DO 07/27/13 1708  Evelena Peat, DO 07/27/13 1610

## 2013-07-27 NOTE — ED Notes (Signed)
Called 5 west at [facility] to give report unit clerk states the charge nurse will be taking this patient and he will call me back for report.

## 2013-07-27 NOTE — ED Notes (Signed)
Physician notified that the patient continues to be tachycardic and mildly hypotensive.  Dr Anitra Lauth advised resident to order D5NS bolus and replace IV.  Primary nurse Efraim Kaufmann, RN)  informed also.

## 2013-07-27 NOTE — Progress Notes (Signed)
PT arrived via [facility] from [facility]. Pt in no apparent distress. Pt with bp- 91/50, r-18-ox-97 on RA,  And t-99.6 orally. RN paged admissions and is awaiting further orders. RN will continue to monitor pt.

## 2013-07-27 NOTE — ED Notes (Signed)
Pt in ultrasound will start iv and give meds upon her return to room

## 2013-07-27 NOTE — ED Notes (Signed)
Pt complaining of nausea vomited after second cup of contrast

## 2013-07-27 NOTE — ED Notes (Signed)
Ambulatory to rest room with husbands help . Pt still tachycardic and feels weak.

## 2013-07-27 NOTE — H&P (Signed)
[facility] History and Physical  Stacy [name] ZOX:[id number] DOB: 23-Feb-1981 DOA: 07/27/2013  Referring physician:  EDP PCP: Loreen Freud, DO  Specialists:   Chief Complaint:   Increased Nausea and Vomiting  HPI: Stacy [name] is a 32 y.o. female previously healthy until Thanksgiving Day when she began to have nausea and vomiting spells.   She reports that the episodes would resolved but would return so she went to the [facility] in the interim twice for her symptoms.  She reports that last night her symptoms returned and would not stop and lasted  Into today and she was taken to the [facility] ED for evalauation.   She reports that she was not able to hold down any foods or liquids during this time.  She reports that she had lunch yesterday at [facility] and had not problems, and that evening she ate some Activia Peach Yogurt and shortly thereafter began to have severe intractable nausea and vomiting and later lower ABD  Pain and Sore Throat from vomiting and wretching.  She denies any changes in her bowels and reports that her last BM was yesterday, and she regularly goes twice a week.  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   She also reports having an unintentional weight loss over the past year.     In the ED at [facility], she was evaluated and had a CT scan preformed which was negative for Colitis and obstriction, and revealed a Left hepatic Inferior lobe mass that measures 5.8 x 7.2 x 6.1 cm and is enhancing on imaging.   It is noted that this process was seen on ultrasound previously  In 11/2011, and appears to be a hepatic adenoma.      Review of Systems: The patient denies anorexia, fever, chills, headaches, vision loss, diplopia, dizziness, decreased hearing, rhinitis, hoarseness, chest pain, syncope, dyspnea on exertion, peripheral edema, balance deficits, cough,  hemoptysis, diarrhea, constipation, hematemesis, melena, hematochezia, severe indigestion/heartburn, dysuria, hematuria, incontinence, suspicious skin lesions, transient blindness, difficulty walking, depression, abnormal bleeding, enlarged lymph nodes, angioedema, and breast masses.    Past Medical History  Diagnosis Date  . No diagnosis     Past Surgical History  Procedure Laterality Date  . Cesarean section       Prior to Admission medications   Medication Sig Start Date End Date Taking? Authorizing Provider  ondansetron (ZOFRAN) 4 MG tablet Take 4 mg by mouth every 8 (eight) hours as needed for nausea or vomiting.   Yes Historical Provider, MD  TRI-PREVIFEM 0.18/0.215/0.25 MG-35 MCG tablet Take 1 tablet by mouth daily. 06/26/13  Yes Historical Provider, MD  omeprazole (PRILOSEC) 20 MG capsule Take 1 capsule (20 mg total) by mouth daily. 07/27/13   Evelena Peat, DO  ondansetron (ZOFRAN) 4 MG tablet Take 1 tablet (4 mg total) by mouth every 8 (eight) hours as needed for nausea or vomiting. 07/27/13   Evelena Peat, DO     Allergies  Allergen Reactions  . Erythromycin Other (See Comments)    unknown  . Tetracycline Other (See Comments)    Unknown   . Other Rash    raspberry   . Sulfonamide Derivatives Rash     Social History:  reports that she has never smoked. She does not have any smokeless tobacco history on file. She reports that she does not drink alcohol or use illicit drugs.  Family History  Problem Relation Age of Onset  . CAD Father   . Hypertension Father   . Diabetes Father      Physical Exam:  GEN:  Pleasant Well nourished and Well developed 32 y.o. Caucasian female  examined  and in no acute distress; cooperative with exam Filed Vitals:   07/27/13 1707 07/27/13 1808 07/27/13 1930 07/27/13 2050  BP: 94/54 107/63 92/50 91/50   Pulse: 120 119 120 113  Temp:    99.6 F (37.6 C)  TempSrc:    Oral  Resp: 18 16 18 18   Height:    5\' 3"  (1.6 m)   Weight:    51.7 kg (113 lb 15.7 oz)  SpO2: 99% 100% 96% 97%   Blood pressure 91/50, pulse 113, temperature 99.6 F (37.6 C), temperature source Oral, resp. rate 18, height 5\' 3"  (1.6 m), weight 51.7 kg (113 lb 15.7 oz), last menstrual period 07/08/2013, SpO2 97.00%. PSYCH: She is alert and oriented x4; does not appear anxious does not appear depressed; affect is normal HEENT: Normocephalic and Atraumatic, Mucous membranes pink; PERRLA; EOM intact; Fundi:  Benign;  No scleral icterus, Nares: Patent, Oropharynx: Clear, Fair Dentition, Neck:  FROM, no cervical lymphadenopathy nor thyromegaly or carotid bruit; no JVD; Breasts:: Not examined CHEST WALL: No tenderness CHEST: Normal respiration, clear to auscultation bilaterally HEART: Regular rate and rhythm; no murmurs rubs or gallops BACK: No kyphosis or scoliosis; no CVA tenderness ABDOMEN: Positive Bowel Sounds, soft non-tender; no masses, no organomegaly. Rectal Exam: Not done EXTREMITIES: No cyanosis, clubbing or edema; no ulcerations. Genitalia: not examined PULSES: 2+ and symmetric SKIN: Normal hydration no rash or ulceration CNS: Cranial nerves 2-12 grossly intact no focal neurologic deficit    Labs on Admission:  Basic Metabolic Panel:  Recent Labs Lab 07/27/13 0945  NA 137  K 4.1  CL 96  CO2 27  GLUCOSE 144*  BUN 16  CREATININE 0.60  CALCIUM 10.0   Liver Function Tests:  Recent Labs Lab 07/27/13 0945  AST 26  ALT 34  ALKPHOS 85  BILITOT 1.3*  PROT 8.4*  ALBUMIN 4.1    Recent Labs Lab 07/27/13 0945  LIPASE 28   No results found for this basename: AMMONIA,  in the last 168 hours CBC:  Recent Labs Lab 07/27/13 0945  WBC 12.0*  NEUTROABS 10.7*  HGB 14.4  HCT 42.3  MCV 84.3  PLT 363   Cardiac Enzymes: No results found for this basename: CKTOTAL, CKMB, CKMBINDEX, TROPONINI,  in the last 168 hours  BNP (last 3 results) No results found for this basename: PROBNP,  in the last 8760 hours CBG: No  results found for this basename: GLUCAP,  in the last 168 hours  Radiological Exams on Admission: US Abdomen Complete  07/27/2013   CLINICAL DATA:  Mid abdominal pain with nausea and vomiting.  EXAM: ULTRASOUND ABDOMEN COMPLETE  COMPARISON:  None.  FINDINGS: Gallbladder:  No gallstones or wall thickening visualized. No sonographic Murphy sign noted.  Common bile duct:  There is slight prominence of the intrahepatic bile ducts. Common bile duct is 4.5 mm in diameter.  Liver:  There is a 5.7 x 6.2 x 5.2 cm isoechoic mass at the inferior tip of the left lobe of the liver. This could represent a hepatic adenoma or focal nodular hyperplasia. There is increased echogenicity of the vessel walls as well as of the bile duct walls. This is nonspecific but can be seen with viral illnesses such as hepatitis.  IVC:  The visualized portion is normal.  Pancreas:  Normal.  Spleen:  Normal.  5.5 cm in length.  Right Kidney:  Length: 10.9 cm. Echogenicity within normal limits. No mass or hydronephrosis visualized.  Left Kidney:  Length: 10.0 cm. Echogenicity within normal limits. No mass or hydronephrosis visualized.  Abdominal aorta:  Normal.  1.8 cm maximum diameter.  Other findings:  None.  IMPRESSION: 1. 6.2 cm mass at the inferior aspect of the left lobe of the liver. In a female of this age the most likely etiology is hepatic adenoma or focal nodular hyperplasia. 2. Echogenic vessel walls and bile duct walls in the liver, nonspecific but this can be seen with viral illnesses such as hepatitis. There is slight intrahepatic ductal prominence. Is the patient's bilirubin elevated?   Electronically Signed   By: Geanie Cooley M.D.   On: 07/27/2013 09:57   Ct Abdomen Pelvis W Contrast  07/27/2013   CLINICAL DATA:  Three day history of vomiting.  EXAM: CT ABDOMEN AND PELVIS WITH CONTRAST  TECHNIQUE: Multidetector CT imaging of the abdomen and pelvis was performed using the standard protocol following bolus administration of  intravenous contrast.  CONTRAST:  100 cc OMNIPAQUE IOHEXOL 300 MG/ML SOLN, the patient could not tolerate oral contrast.  COMPARISON:  Abdominal ultrasound of today's date.  FINDINGS: The stomach is partially distended with oral contrast. Contrast extends into the upper small bowel. The distal small bowel loops appear mildly distended with non contrasted fluid. The colon contains stool and fluid down to the level of the rectum. The cecum is moderately distended with fluid and measures as much as 8 cm in greatest dimension There is no evidence of mural thickening. No free extraluminal gas or fluid collections are demonstrated.  There is a large homogeneously enhancing hyperdense mass in the left hepatic lobe inferiorly which was described on the earlier ultrasound. It measures 5.8 cm AP x 7.2 cm transversely x 6.1 cm in superior to inferior dimension. No other hepatic masses are demonstrated. There is no intrahepatic ductal dilation. The gallbladder is adequately distended with no evidence of stones. The spleen is not enlarged. There are no adrenal masses. The kidneys enhance well. The caliber of the abdominal aorta is normal. No periaortic or pericaval or intraperitoneal lymph nodes are demonstrated.  Within the pelvis the partially distended urinary bladder is grossly normal. The uterus and adnexal structures exhibit no acute abnormalities. There may be cysts associated with the right ovary. There is no umbilical nor inguinal hernia.  The lung bases exhibit no acute abnormality. The lumbar vertebral bodies are preserved in height. The bony pelvis exhibits no acute abnormality.  IMPRESSION: 1. There is mild distention of much of the colon with fluid and some stool and gas. The pattern suggests a diarrheal type process. Portions of the distal small bowel appear mildly dilated with fluid as well. There is no evidence of obstruction currently. There is no evidence of acute appendicitis. 2. There is a known enhancing  mass in the left hepatic lobe which likely reflects an hepatic adenoma. No other hepatic masses are demonstrated. No acute abnormality of the gallbladder or pancreas or spleen is demonstrated. 3. There is no intra-abdominal or pelvic lymphadenopathy. There is no evidence of an abscess nor free fluid nor free air.   Electronically Signed   By: David  [place]   On: 07/27/2013 12:36      Assessment/Plan Principal Problem:   Intractable nausea and vomiting Active Problems:   Dehydration   Hypotension,  unspecified   Hyperglycemia   Hepatic adenoma   Lower ABD Pain    1.   Intractable N+V- Episodic in nature: sounds as if it may be due to a Food Allergy,  Check IgE level and Recommend Outpatient Allergy testing and to keep a food diary.      Supportive Rx, Anti-Emetics PRN, and clear liquid Diet and Advance as Tolerated.     2.  ABD Pain-  Due to #1   Pain Control PRNn with IV Fentanyl due to hypotension.     3.  Dehydration- caused by #1,  Rehydrate and monitor BPs and BUN/Cr and electrolytes.    4.  Hypotension-  Same Rx as #3.    5.  Hyperglycemia-  May be Pre-diabetic,  Check HbA1C and monitor Glucose levels and SSI coverage PRN.     6.  Hepatic Adenoma- Seen on [place] Studies since 11/2011.  Check Hepatitis Studies, and consider outpatient versus Inpatient further evaluation for Tissue Diagnosis if needed.    7.  DVT prophylaxis with Lovenox.      Code Status:   FULL CODE Family Communication:   Family at bedside Disposition Plan:    Observation Status     Time spent:  60 minutes  Ron Parker [facility] Pager [phone]  If 7PM-7AM, please contact night-coverage [email or web address] Password [facility] 07/27/2013, 9:38 PM

## 2013-07-27 NOTE — ED Notes (Signed)
Issues with abdominal pain and having vomiting every couple of days. She has been seen x 2 by [facility] emergent care states had blood work checked for h pylori that was negative and abdominal x ray that revealed "excess stool" reports that she had a small bowel movement last night and the day before the pain is mid abdomen the vomiting does occur after eating per pt and spouse

## 2013-07-27 NOTE — ED Notes (Signed)
Crushed ice given to patient per dr Radford Pax

## 2013-07-27 NOTE — Progress Notes (Signed)
PENDING ACCEPTANCE TRANFER NOTE:  Call received from:   [place] resident at a [facility]  REASON FOR REQUESTING TRANSFER:    Nausea/vomiting abdominal pain  HPI:   32 year old female presented with nausea/vomiting and abdominal pain, she was seen recently 2 times and [facility] for same complaints. She reports that her x-ray revealed "excess stool". She is complaining about constipation. CT scan did not show colitis or obstruction. ED physician requested admission because of nausea/vomiting patient not tolerating by mouth intake.   PLAN:  According to telephone report, this patient was accepted for transfer to [facility],   Under [facility] team:  10, have requested an order be written to call Flow Manager at [phone] upon patient arrival to the floor for final physician assignment who will do the admission and give admitting orders.  SIGNED: Clint Lipps, MD [facility]  07/27/2013, 5:35 PM

## 2013-07-28 DIAGNOSIS — D134 Benign neoplasm of liver: Secondary | ICD-10-CM

## 2013-07-28 DIAGNOSIS — R112 Nausea with vomiting, unspecified: Secondary | ICD-10-CM

## 2013-07-28 DIAGNOSIS — E860 Dehydration: Secondary | ICD-10-CM

## 2013-07-28 LAB — URINE CULTURE: Colony Count: 15000

## 2013-07-28 LAB — COMPREHENSIVE METABOLIC PANEL
AST: 13 U/L (ref 0–37)
Alkaline Phosphatase: 58 U/L (ref 39–117)
BUN: 5 mg/dL — ABNORMAL LOW (ref 6–23)
CO2: 24 mEq/L (ref 19–32)
Chloride: 103 mEq/L (ref 96–112)
Creatinine, Ser: 0.47 mg/dL — ABNORMAL LOW (ref 0.50–1.10)
GFR calc Af Amer: >90 mL/min (ref >90)
GFR calc non Af Amer: >90 mL/min (ref >90)
Glucose, Bld: 92 mg/dL (ref 70–99)
Potassium: 3.9 mEq/L (ref 3.5–5.1)
Sodium: 135 mEq/L (ref 135–145)
Total Bilirubin: 0.6 mg/dL (ref 0.3–1.2)

## 2013-07-28 LAB — BASIC METABOLIC PANEL
CO2: 23 mEq/L (ref 19–32)
Chloride: 107 mEq/L (ref 96–112)
GFR calc Af Amer: >90 mL/min (ref >90)
GFR calc non Af Amer: >90 mL/min (ref >90)
Glucose, Bld: 94 mg/dL (ref 70–99)
Potassium: 3.2 mEq/L — ABNORMAL LOW (ref 3.5–5.1)
Sodium: 137 mEq/L (ref 135–145)

## 2013-07-28 LAB — HEPATITIS PANEL, ACUTE
HCV Ab: NEGATIVE
Hep B C IgM: NONREACTIVE
Hepatitis B Surface Ag: NEGATIVE

## 2013-07-28 LAB — CBC
Hemoglobin: 11 g/dL — ABNORMAL LOW (ref 12.0–15.0)
MCV: 85.9 fL (ref 78.0–100.0)
RBC: 3.82 MIL/uL — ABNORMAL LOW (ref 3.87–5.11)
RDW: 12.7 % (ref 11.5–15.5)
WBC: 9 10*3/uL (ref 4.0–10.5)

## 2013-07-28 LAB — HEMOGLOBIN A1C
Hgb A1c MFr Bld: 5.1 % (ref <5.7)
Mean Plasma Glucose: 100 mg/dL (ref <117)

## 2013-07-28 LAB — GLUCOSE, CAPILLARY: Glucose-Capillary: 88 mg/dL (ref 70–99)

## 2013-07-28 LAB — IGE: IgE (Immunoglobulin E), Serum: 81.1 IU/mL (ref 0.0–180.0)

## 2013-07-28 MED ORDER — POTASSIUM CHLORIDE CRYS ER 20 MEQ PO TBCR
40.0000 meq | EXTENDED_RELEASE_TABLET | Freq: Once | ORAL | Status: AC
  Administered 2013-07-28: 40 meq via ORAL
  Filled 2013-07-28: qty 2

## 2013-07-28 MED ORDER — INSULIN ASPART 100 UNIT/ML ~~LOC~~ SOLN: 0.0000 [IU] | SUBCUTANEOUS | Status: DC

## 2013-07-28 NOTE — Progress Notes (Signed)
Utilization Review completed.  

## 2013-07-28 NOTE — Progress Notes (Signed)
[facility] PROGRESS NOTE  Stacy NOVIELLO ZOX:[id number] DOB: Oct 15, 1980 DOA: 07/27/2013 PCP: Loreen Freud, DO  Assessment/Plan: 1. Nausea and vomiting: improved this morning has been able to tolerate clear liquid diet for breakfast. Will advance diet.  Etiology of 2-3 weeks of nausea is unclear. 2. hepatic adenoma on CT: I suspect that this is a hemangioma. Will obtain a dedicated MRI of the liver to evaluate. 3. Hypokalemia: due to vomiting. Will replete. 4. Dehydration: on IVF overnight. Now that she is tolerating PO will stop IVF.  Code Status: full Family Communication: spoke with patient and husband at bedside Disposition Plan: remain in hospital until tolerating   Consultants: none  Procedures:  Imaging as below  Antibiotics:  none  HPI/Subjective: 32 yof with no significant pmh presented to the ED on 12/12 with nausea and vomiting for 2-3 weeks. CT shows some distention, no edema or obstruction.  There is a left hepatic lower lobe mass present which has been stable since 11/2011.  Today she is feeling better. Has tolerated liquid breakfast.  No further nausea.  Objective: Filed Vitals:   07/28/13 1200  BP: 106/73  Pulse: 105  Temp: 98.6 F (37 C)  Resp: 18    Intake/Output Summary (Last 24 hours) at 07/28/13 1325 Last data filed at 07/28/13 0900  Gross per 24 hour  Intake 3184.58 ml  Output      0 ml  Net 3184.58 ml   Filed Weights   07/27/13 0818 07/27/13 2050  Weight: 50.803 kg (112 lb) 51.7 kg (113 lb 15.7 oz)    Exam:   General:  NAD  Cardiovascular: rrr no mrg  Respiratory: CTAB  Abdomen: BS+, soft, non tender, distended  Musculoskeletal: no edema  Data Reviewed: Basic Metabolic Panel:  Recent Labs Lab 07/27/13 0945 07/28/13 0430  NA 137 137  K 4.1 3.2*  CL 96 107  CO2 27 23  GLUCOSE 144* 94  BUN 16 7  CREATININE 0.60 0.55  CALCIUM 10.0 7.5*   Liver Function Tests:  Recent Labs Lab 07/27/13 0945  AST 26  ALT  34  ALKPHOS 85  BILITOT 1.3*  PROT 8.4*  ALBUMIN 4.1    Recent Labs Lab 07/27/13 0945  LIPASE 28   No results found for this basename: AMMONIA,  in the last 168 hours CBC:  Recent Labs Lab 07/27/13 0945 07/28/13 0430  WBC 12.0* 9.0  NEUTROABS 10.7*  --   HGB 14.4 11.0*  HCT 42.3 32.8*  MCV 84.3 85.9  PLT 363 282   Cardiac Enzymes: No results found for this basename: CKTOTAL, CKMB, CKMBINDEX, TROPONINI,  in the last 168 hours BNP (last 3 results) No results found for this basename: PROBNP,  in the last 8760 hours CBG:  Recent Labs Lab 07/27/13 2223 07/28/13 0253 07/28/13 0638  GLUCAP 79 88 82    No results found for this or any previous visit (from the past 240 hour(s)).   Studies: US Abdomen Complete  07/27/2013   CLINICAL DATA:  Mid abdominal pain with nausea and vomiting.  EXAM: ULTRASOUND ABDOMEN COMPLETE  COMPARISON:  None.  FINDINGS: Gallbladder:  No gallstones or wall thickening visualized. No sonographic Murphy sign noted.  Common bile duct:  There is slight prominence of the intrahepatic bile ducts. Common bile duct is 4.5 mm in diameter.  Liver:  There is a 5.7 x 6.2 x 5.2 cm isoechoic mass at the inferior tip of the left lobe of the liver. This could represent a hepatic adenoma  or focal nodular hyperplasia. There is increased echogenicity of the vessel walls as well as of the bile duct walls. This is nonspecific but can be seen with viral illnesses such as hepatitis.  IVC:  The visualized portion is normal.  Pancreas:  Normal.  Spleen:  Normal.  5.5 cm in length.  Right Kidney:  Length: 10.9 cm. Echogenicity within normal limits. No mass or hydronephrosis visualized.  Left Kidney:  Length: 10.0 cm. Echogenicity within normal limits. No mass or hydronephrosis visualized.  Abdominal aorta:  Normal.  1.8 cm maximum diameter.  Other findings:  None.  IMPRESSION: 1. 6.2 cm mass at the inferior aspect of the left lobe of the liver. In a female of this age the most  likely etiology is hepatic adenoma or focal nodular hyperplasia. 2. Echogenic vessel walls and bile duct walls in the liver, nonspecific but this can be seen with viral illnesses such as hepatitis. There is slight intrahepatic ductal prominence. Is the patient's bilirubin elevated?   Electronically Signed   By: Geanie Cooley M.D.   On: 07/27/2013 09:57   Ct Abdomen Pelvis W Contrast  07/27/2013   CLINICAL DATA:  Three day history of vomiting.  EXAM: CT ABDOMEN AND PELVIS WITH CONTRAST  TECHNIQUE: Multidetector CT imaging of the abdomen and pelvis was performed using the standard protocol following bolus administration of intravenous contrast.  CONTRAST:  100 cc OMNIPAQUE IOHEXOL 300 MG/ML SOLN, the patient could not tolerate oral contrast.  COMPARISON:  Abdominal ultrasound of today's date.  FINDINGS: The stomach is partially distended with oral contrast. Contrast extends into the upper small bowel. The distal small bowel loops appear mildly distended with non contrasted fluid. The colon contains stool and fluid down to the level of the rectum. The cecum is moderately distended with fluid and measures as much as 8 cm in greatest dimension There is no evidence of mural thickening. No free extraluminal gas or fluid collections are demonstrated.  There is a large homogeneously enhancing hyperdense mass in the left hepatic lobe inferiorly which was described on the earlier ultrasound. It measures 5.8 cm AP x 7.2 cm transversely x 6.1 cm in superior to inferior dimension. No other hepatic masses are demonstrated. There is no intrahepatic ductal dilation. The gallbladder is adequately distended with no evidence of stones. The spleen is not enlarged. There are no adrenal masses. The kidneys enhance well. The caliber of the abdominal aorta is normal. No periaortic or pericaval or intraperitoneal lymph nodes are demonstrated.  Within the pelvis the partially distended urinary bladder is grossly normal. The uterus and  adnexal structures exhibit no acute abnormalities. There may be cysts associated with the right ovary. There is no umbilical nor inguinal hernia.  The lung bases exhibit no acute abnormality. The lumbar vertebral bodies are preserved in height. The bony pelvis exhibits no acute abnormality.  IMPRESSION: 1. There is mild distention of much of the colon with fluid and some stool and gas. The pattern suggests a diarrheal type process. Portions of the distal small bowel appear mildly dilated with fluid as well. There is no evidence of obstruction currently. There is no evidence of acute appendicitis. 2. There is a known enhancing mass in the left hepatic lobe which likely reflects an hepatic adenoma. No other hepatic masses are demonstrated. No acute abnormality of the gallbladder or pancreas or spleen is demonstrated. 3. There is no intra-abdominal or pelvic lymphadenopathy. There is no evidence of an abscess nor free fluid nor free air.  Electronically Signed   By: David  [place]   On: 07/27/2013 12:36    Scheduled Meds: . enoxaparin (LOVENOX) injection  40 mg Subcutaneous Q24H  . insulin aspart  0-9 Units Subcutaneous Q4H  . Norgestimate-Ethinyl Estradiol Triphasic  1 tablet Oral Daily  . sodium chloride  3 mL Intravenous Q12H   Continuous Infusions: . sodium chloride Stopped (07/27/13 2224)  . sodium chloride 125 mL/hr at 07/28/13 8119    Principal Problem:   Intractable nausea and vomiting Active Problems:   Dehydration   Hypotension, unspecified   Hyperglycemia   Hepatic adenoma    Time spent: 35 min   [facility]  [facility] Pager [phone] If 7PM-7AM, please contact night-coverage at [email or web address], password [facility] 07/28/2013, 1:25 PM  LOS: 1 day

## 2013-07-28 NOTE — Discharge Summary (Signed)
Physician Discharge Summary  Stacy [name] ZOX:[id number] DOB: [birth year] DOA: 07/27/2013  PCP: Loreen Freud, DO  Admit date: 07/27/2013 Discharge date: 07/28/2013  Time spent:  35 minutes  Recommendations for Outpatient Follow-up:  1. Follow up with PCP as soon as possible for further evaluation of liver mass. 2. Advance diet slowly  Discharge Diagnoses:  Principal Problem:   Intractable nausea and vomiting Active Problems:   Dehydration   Hypotension, unspecified   Hyperglycemia   Hepatic adenoma   Discharge Condition: good  Diet recommendation: bland  Filed Weights   07/27/13 0818 07/27/13 2050  Weight: 50.803 kg (112 lb) 51.7 kg (113 lb 15.7 oz)    History of present illness:  Stacy [name] is a 32 y.o. female previously healthy until Thanksgiving Day when she began to have nausea and vomiting spells. She reports that the episodes would resolved but would return so she went to the [facility] in the interim twice for her symptoms. She reports that last night her symptoms returned and would not stop and lasted Into today and she was taken to the [facility] ED for evalauation. She reports that she was not able to hold down any foods or liquids during this time. She reports that she had lunch yesterday at [facility] and had not problems, and that evening she ate some Activia Peach Yogurt and shortly thereafter began to have severe intractable nausea and vomiting and later lower ABD Pain and Sore Throat from vomiting and wretching. She denies any changes in her bowels and reports that her last BM was yesterday, and she regularly goes twice a week. 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 She also reports having an unintentional weight loss over the past year.  In the ED at [facility], she was evaluated and had a CT scan preformed which was negative for Colitis and  obstriction, and revealed a Left hepatic Inferior lobe mass that measures 5.8 x 7.2 x 6.1 cm and is enhancing on imaging. It is noted that this process was seen on ultrasound previously In 11/2011, and appears to be a hepatic adenoma.   Hospital Course:  1. intractable nausea and vomiting: after receiving zofran and rehydration nausea and vomiting stopped. She tolerated two meals without complication.  She is discharged with a prescription for zofran. 2. Hepatic mass: there was a focal nodule in the left lobe of the liver seen on [place] and CT which is concerning for hepatic adenoma.  A dedicated liver MRI was ordered, but the patient requested to proceed with this work up as an outpatient and declined to have the study.  She understands that this could be a malignancy and it is very important that she follow up on this result within the next few weeks. 3. Hypokalemia: due to vomiting. repleted and normal at time of discharged 4. Dispo: on the day of discharge the patient insisted upon leaving.  As she was tolerating food and labs were normal there was no urgent need to keep her inpatient. The liver studies could be done outpatient.  She understands that she must follow up in this important issue. Her husband also understands this.  The have chosen to be discharged and precede with the work up outpatient.  Procedures:  none  Consultations:  none  Discharge Exam: Filed Vitals:   07/28/13 1819  BP: 97/66  Pulse: 109  Temp: 98.6 F (37 C)  Resp: 18    General: NAD  Cardiovascular: rrr no mrg  Respiratory: CTAB  Abdomen: BS+, soft, non tender, distended  Musculoskeletal: no edema   Discharge Instructions  Discharge Orders   Future Orders Complete By Expires   Call MD for:  persistant nausea and vomiting  As directed    Call MD for:  temperature >100.4  As directed    Diet - low sodium heart healthy  As directed    Discharge instructions  As directed    Comments:     Please find a  primary care provider and make an appointment this month. Go slow with diet- stick to bland foods.   Increase activity slowly  As directed        Medication List         omeprazole 20 MG capsule  Commonly known as:  PRILOSEC  Take 1 capsule (20 mg total) by mouth daily.     ondansetron 4 MG tablet  Commonly known as:  ZOFRAN  Take 1 tablet (4 mg total) by mouth every 8 (eight) hours as needed for nausea or vomiting.     ondansetron 4 MG tablet  Commonly known as:  ZOFRAN  Take 4 mg by mouth every 8 (eight) hours as needed for nausea or vomiting.     TRI-PREVIFEM 0.18/0.215/0.25 MG-35 MCG tablet  Generic drug:  Norgestimate-Ethinyl Estradiol Triphasic  Take 1 tablet by mouth daily.       Allergies  Allergen Reactions  . Erythromycin Other (See Comments)    unknown  . Tetracycline Other (See Comments)    Unknown   . Other Rash    raspberry   . Sulfonamide Derivatives Rash       Follow-up Information   Follow up with [facility]. (If symptoms worsen)    Specialty:  Emergency Medicine   Contact information:   [address] [id number] [place] [address] [phone]       The results of significant diagnostics from this hospitalization (including imaging, microbiology, ancillary and laboratory) are listed below for reference.    Significant Diagnostic Studies: US Abdomen Complete  07/27/2013   CLINICAL DATA:  Mid abdominal pain with nausea and vomiting.  EXAM: ULTRASOUND ABDOMEN COMPLETE  COMPARISON:  None.  FINDINGS: Gallbladder:  No gallstones or wall thickening visualized. No sonographic Murphy sign noted.  Common bile duct:  There is slight prominence of the intrahepatic bile ducts. Common bile duct is 4.5 mm in diameter.  Liver:  There is a 5.7 x 6.2 x 5.2 cm isoechoic mass at the inferior tip of the left lobe of the liver. This could represent a hepatic adenoma or focal nodular hyperplasia. There is increased  echogenicity of the vessel walls as well as of the bile duct walls. This is nonspecific but can be seen with viral illnesses such as hepatitis.  IVC:  The visualized portion is normal.  Pancreas:  Normal.  Spleen:  Normal.  5.5 cm in length.  Right Kidney:  Length: 10.9 cm. Echogenicity within normal limits. No mass or hydronephrosis visualized.  Left Kidney:  Length: 10.0 cm. Echogenicity within normal limits. No mass or hydronephrosis visualized.  Abdominal aorta:  Normal.  1.8 cm maximum diameter.  Other findings:  None.  IMPRESSION: 1. 6.2 cm mass at the inferior aspect of the left lobe of the liver. In a female of this age the most likely etiology is hepatic adenoma or focal nodular hyperplasia. 2. Echogenic vessel walls and  bile duct walls in the liver, nonspecific but this can be seen with viral illnesses such as hepatitis. There is slight intrahepatic ductal prominence. Is the patient's bilirubin elevated?   Electronically Signed   By: Geanie Cooley M.D.   On: 07/27/2013 09:57   Ct Abdomen Pelvis W Contrast  07/27/2013   CLINICAL DATA:  Three day history of vomiting.  EXAM: CT ABDOMEN AND PELVIS WITH CONTRAST  TECHNIQUE: Multidetector CT imaging of the abdomen and pelvis was performed using the standard protocol following bolus administration of intravenous contrast.  CONTRAST:  100 cc OMNIPAQUE IOHEXOL 300 MG/ML SOLN, the patient could not tolerate oral contrast.  COMPARISON:  Abdominal ultrasound of today's date.  FINDINGS: The stomach is partially distended with oral contrast. Contrast extends into the upper small bowel. The distal small bowel loops appear mildly distended with non contrasted fluid. The colon contains stool and fluid down to the level of the rectum. The cecum is moderately distended with fluid and measures as much as 8 cm in greatest dimension There is no evidence of mural thickening. No free extraluminal gas or fluid collections are demonstrated.  There is a large homogeneously  enhancing hyperdense mass in the left hepatic lobe inferiorly which was described on the earlier ultrasound. It measures 5.8 cm AP x 7.2 cm transversely x 6.1 cm in superior to inferior dimension. No other hepatic masses are demonstrated. There is no intrahepatic ductal dilation. The gallbladder is adequately distended with no evidence of stones. The spleen is not enlarged. There are no adrenal masses. The kidneys enhance well. The caliber of the abdominal aorta is normal. No periaortic or pericaval or intraperitoneal lymph nodes are demonstrated.  Within the pelvis the partially distended urinary bladder is grossly normal. The uterus and adnexal structures exhibit no acute abnormalities. There may be cysts associated with the right ovary. There is no umbilical nor inguinal hernia.  The lung bases exhibit no acute abnormality. The lumbar vertebral bodies are preserved in height. The bony pelvis exhibits no acute abnormality.  IMPRESSION: 1. There is mild distention of much of the colon with fluid and some stool and gas. The pattern suggests a diarrheal type process. Portions of the distal small bowel appear mildly dilated with fluid as well. There is no evidence of obstruction currently. There is no evidence of acute appendicitis. 2. There is a known enhancing mass in the left hepatic lobe which likely reflects an hepatic adenoma. No other hepatic masses are demonstrated. No acute abnormality of the gallbladder or pancreas or spleen is demonstrated. 3. There is no intra-abdominal or pelvic lymphadenopathy. There is no evidence of an abscess nor free fluid nor free air.   Electronically Signed   By: David  [place]   On: 07/27/2013 12:36    Microbiology: Recent Results (from the past 240 hour(s))  URINE CULTURE     Status: None   Collection Time    07/27/13  8:28 AM      Result Value Range Status   Specimen Description URINE, CLEAN CATCH   Final   Special Requests NONE   Final   Culture  Setup Time     Final    Value: 07/27/2013 17:55     Performed at [facility] Count     Final   Value: 15,000 COLONIES/ML     Performed at [facility]   Culture     Final   Value: Multiple bacterial morphotypes present, none predominant. Suggest appropriate recollection  if clinically indicated.     Performed at [facility]   Report Status 07/28/2013 FINAL   Final     Labs: Basic Metabolic Panel:  Recent Labs Lab 07/27/13 0945 07/28/13 0430 07/28/13 1755  NA 137 137 135  K 4.1 3.2* 3.9  CL 96 107 103  CO2 27 23 24   GLUCOSE 144* 94 92  BUN 16 7 5*  CREATININE 0.60 0.55 0.47*  CALCIUM 10.0 7.5* 7.9*   Liver Function Tests:  Recent Labs Lab 07/27/13 0945 07/28/13 1755  AST 26 13  ALT 34 16  ALKPHOS 85 58  BILITOT 1.3* 0.6  PROT 8.4* 5.7*  ALBUMIN 4.1 2.5*    Recent Labs Lab 07/27/13 0945  LIPASE 28   No results found for this basename: AMMONIA,  in the last 168 hours CBC:  Recent Labs Lab 07/27/13 0945 07/28/13 0430  WBC 12.0* 9.0  NEUTROABS 10.7*  --   HGB 14.4 11.0*  HCT 42.3 32.8*  MCV 84.3 85.9  PLT 363 282   Cardiac Enzymes: No results found for this basename: CKTOTAL, CKMB, CKMBINDEX, TROPONINI,  in the last 168 hours BNP: BNP (last 3 results) No results found for this basename: PROBNP,  in the last 8760 hours CBG:  Recent Labs Lab 07/27/13 2223 07/28/13 0253 07/28/13 0638  GLUCAP 79 88 82       Signed:  Irie Dowson  [facility] 07/28/2013, 7:16 PM

## 2013-07-28 NOTE — Progress Notes (Signed)
Stacy [name] discharged Home per MD order.  Discharge instructions reviewed and discussed with the patient, all questions and concerns answered. Copy of instructions given to patient.    Medication List         omeprazole 20 MG capsule  Commonly known as:  PRILOSEC  Take 1 capsule (20 mg total) by mouth daily.     ondansetron 4 MG tablet  Commonly known as:  ZOFRAN  Take 1 tablet (4 mg total) by mouth every 8 (eight) hours as needed for nausea or vomiting.     ondansetron 4 MG tablet  Commonly known as:  ZOFRAN  Take 4 mg by mouth every 8 (eight) hours as needed for nausea or vomiting.     TRI-PREVIFEM 0.18/0.215/0.25 MG-35 MCG tablet  Generic drug:  Norgestimate-Ethinyl Estradiol Triphasic  Take 1 tablet by mouth daily.        Patients skin is clean, dry and intact, no evidence of skin break down. IV site discontinued and catheter remains intact. Site without signs and symptoms of complications. Dressing and pressure applied.  Patient escorted to car by NT in a wheelchair,  no distress noted upon discharge.  Julien Nordmann [facility] 07/28/2013 8:28 PM

## 2013-07-30 LAB — GLUCOSE, CAPILLARY: Glucose-Capillary: 86 mg/dL (ref 70–99)

## 2013-08-02 NOTE — ED Provider Notes (Signed)
I saw and evaluated the patient, reviewed the resident's note and I agree with the findings and plan.   .Face to face Exam:  General:  Awake HEENT:  Atraumatic Resp:  Normal effort Abd:  Nondistended Neuro:No focal weakness    Nelia Shi, MD 08/02/13 1042

## 2013-08-07 ENCOUNTER — Other Ambulatory Visit: Payer: Self-pay | Admitting: Family Medicine

## 2013-08-07 DIAGNOSIS — R160 Hepatomegaly, not elsewhere classified: Secondary | ICD-10-CM

## 2013-08-18 ENCOUNTER — Ambulatory Visit
Admission: RE | Admit: 2013-08-18 | Discharge: 2013-08-18 | Disposition: A | Discharge status: Home / Self Care | Payer: BC Managed Care – PPO | Source: Ambulatory Visit | Attending: Family Medicine | Admitting: Family Medicine

## 2013-08-18 DIAGNOSIS — R160 Hepatomegaly, not elsewhere classified: Secondary | ICD-10-CM

## 2013-08-18 MED ORDER — GADOXETATE DISODIUM 0.25 MMOL/ML IV SOLN
5.0000 mL | Freq: Once | INTRAVENOUS | Status: AC | PRN
  Administered 2013-08-18: 5 mL via INTRAVENOUS

## 2013-09-13 ENCOUNTER — Encounter (HOSPITAL_COMMUNITY): Payer: Self-pay | Admitting: Emergency Medicine

## 2013-09-13 ENCOUNTER — Emergency Department (HOSPITAL_COMMUNITY)
Admission: EM | Admit: 2013-09-13 | Discharge: 2013-09-13 | Disposition: A | Discharge status: Home / Self Care | Payer: BC Managed Care – PPO | Attending: Emergency Medicine | Admitting: Emergency Medicine

## 2013-09-13 DIAGNOSIS — Z882 Allergy status to sulfonamides status: Secondary | ICD-10-CM | POA: Insufficient documentation

## 2013-09-13 DIAGNOSIS — Z79899 Other long term (current) drug therapy: Secondary | ICD-10-CM | POA: Insufficient documentation

## 2013-09-13 DIAGNOSIS — K7689 Other specified diseases of liver: Secondary | ICD-10-CM | POA: Insufficient documentation

## 2013-09-13 DIAGNOSIS — R69 Illness, unspecified: Secondary | ICD-10-CM | POA: Insufficient documentation

## 2013-09-13 DIAGNOSIS — D72829 Elevated white blood cell count, unspecified: Secondary | ICD-10-CM | POA: Insufficient documentation

## 2013-09-13 DIAGNOSIS — R109 Unspecified abdominal pain: Secondary | ICD-10-CM | POA: Insufficient documentation

## 2013-09-13 DIAGNOSIS — R110 Nausea: Secondary | ICD-10-CM

## 2013-09-13 DIAGNOSIS — Z881 Allergy status to other antibiotic agents status: Secondary | ICD-10-CM | POA: Insufficient documentation

## 2013-09-13 HISTORY — DX: Other specified diseases of liver: K76.89

## 2013-09-13 LAB — CBC WITH DIFFERENTIAL/PLATELET
BASOS PCT: 0 % (ref 0–1)
Basophils Absolute: 0 10*3/uL (ref 0.0–0.1)
EOS ABS: 0.1 10*3/uL (ref 0.0–0.7)
EOS PCT: 0 % (ref 0–5)
HCT: 41 % (ref 36.0–46.0)
Hemoglobin: 14.5 g/dL (ref 12.0–15.0)
LYMPHS ABS: 2.1 10*3/uL (ref 0.7–4.0)
Lymphocytes Relative: 13 % (ref 12–46)
MCH: 30.1 pg (ref 26.0–34.0)
MCHC: 35.4 g/dL (ref 30.0–36.0)
MCV: 85.1 fL (ref 78.0–100.0)
Monocytes Absolute: 1.4 10*3/uL — ABNORMAL HIGH (ref 0.1–1.0)
Monocytes Relative: 9 % (ref 3–12)
NEUTROS PCT: 78 % — AB (ref 43–77)
Neutro Abs: 12.6 10*3/uL — ABNORMAL HIGH (ref 1.7–7.7)
PLATELETS: 313 10*3/uL (ref 150–400)
RBC: 4.82 MIL/uL (ref 3.87–5.11)
RDW: 12.5 % (ref 11.5–15.5)
WBC: 16.3 10*3/uL — ABNORMAL HIGH (ref 4.0–10.5)

## 2013-09-13 LAB — COMPREHENSIVE METABOLIC PANEL
ALBUMIN: 4 g/dL (ref 3.5–5.2)
ALT: 12 U/L (ref 0–35)
AST: 13 U/L (ref 0–37)
Alkaline Phosphatase: 74 U/L (ref 39–117)
BUN: 12 mg/dL (ref 6–23)
CALCIUM: 9.9 mg/dL (ref 8.4–10.5)
CO2: 23 mEq/L (ref 19–32)
Chloride: 100 mEq/L (ref 96–112)
Creatinine, Ser: 0.57 mg/dL (ref 0.50–1.10)
GFR calc non Af Amer: >90 mL/min (ref >90)
GLUCOSE: 112 mg/dL — AB (ref 70–99)
POTASSIUM: 3.9 meq/L (ref 3.7–5.3)
Sodium: 138 mEq/L (ref 137–147)
TOTAL PROTEIN: 7.5 g/dL (ref 6.0–8.3)
Total Bilirubin: 1.2 mg/dL (ref 0.3–1.2)

## 2013-09-13 LAB — URINALYSIS, ROUTINE W REFLEX MICROSCOPIC
Glucose, UA: NEGATIVE mg/dL
Ketones, ur: >80 mg/dL — AB
Leukocytes, UA: NEGATIVE
NITRITE: NEGATIVE
PH: 6 (ref 5.0–8.0)
Protein, ur: NEGATIVE mg/dL
SPECIFIC GRAVITY, URINE: 1.023 (ref 1.005–1.030)
Urobilinogen, UA: 0.2 mg/dL (ref 0.0–1.0)

## 2013-09-13 LAB — URINE MICROSCOPIC-ADD ON

## 2013-09-13 LAB — PREGNANCY, URINE: Preg Test, Ur: NEGATIVE

## 2013-09-13 LAB — LIPASE, BLOOD: Lipase: 51 U/L (ref 11–59)

## 2013-09-13 LAB — POCT PREGNANCY, URINE: Preg Test, Ur: NEGATIVE

## 2013-09-13 MED ORDER — SODIUM CHLORIDE 0.9 % IV BOLUS (SEPSIS)
1000.0000 mL | Freq: Once | INTRAVENOUS | Status: AC
  Administered 2013-09-13: 1000 mL via INTRAVENOUS

## 2013-09-13 MED ORDER — KETOROLAC TROMETHAMINE 30 MG/ML IJ SOLN
30.0000 mg | Freq: Once | INTRAMUSCULAR | Status: AC
  Administered 2013-09-13: 30 mg via INTRAVENOUS
  Filled 2013-09-13: qty 1

## 2013-09-13 MED ORDER — ONDANSETRON 4 MG PO TBDP: 4.0000 mg | ORAL_TABLET | Freq: Three times a day (TID) | ORAL | Status: DC | PRN

## 2013-09-13 MED ORDER — DICYCLOMINE HCL 20 MG PO TABS: 20.0000 mg | ORAL_TABLET | Freq: Two times a day (BID) | ORAL | Status: DC

## 2013-09-13 MED ORDER — PROMETHAZINE HCL 25 MG PO TABS: 25.0000 mg | ORAL_TABLET | Freq: Four times a day (QID) | ORAL | Status: DC | PRN

## 2013-09-13 MED ORDER — ONDANSETRON HCL 4 MG/2ML IJ SOLN
4.0000 mg | Freq: Once | INTRAMUSCULAR | Status: AC
  Administered 2013-09-13: 4 mg via INTRAVENOUS
  Filled 2013-09-13: qty 2

## 2013-09-13 NOTE — Discharge Instructions (Signed)
Please call your doctor for a followup appointment within 24-48 hours. When you talk to your doctor please let them know that you were seen in the emergency department and have them acquire all of your records so that they can discuss the findings with you and formulate a treatment plan to fully care for your new and ongoing problems. ° °

## 2013-09-13 NOTE — ED Notes (Signed)
Per EMS - pt c/o n/v that began approx 2245-2300 yesterday evening, pt admits lower abd pain - pt took 4mg  ODT zofran prior to EMS arrival. Per fire dept pt w/ SBP 140 and upon sitting BP 100/60 - EMS administered approx 260ml NS.

## 2013-09-13 NOTE — ED Provider Notes (Signed)
CSN: [id number]     Arrival date & time 09/13/13  0129 History   First MD Initiated Contact with Patient 09/13/13 0231     Chief Complaint  Patient presents with  . Nausea  . Emesis   (Consider location/radiation/quality/duration/timing/severity/associated sxs/prior Treatment) HPI Comments: 33 year old female with no significant past medical history until approximately one month ago when she presented to the hospital with a complaint of abdominal pain nausea and vomiting. Her workup at that time included a CT scan and an ultrasound which revealed an isolated liver lesion which has further been characterized by MRI and with whom she is following up with the [place] [facility] [place] at [facility] gastroenterology department. The patient states that over the last month she has had intermittent abdominal cramping associated with nausea and vomiting. This is not happening everyday but does come for 2 or 3 days and then goes away for 2 or 3 days. She has no diarrhea, persistent nausea which her from eating or drinking and no fevers or chills. At this time she denies abdominal pain and has mild nausea. Nothing seems to make this better or worse, she denies marijuana use, has had one prior abdominal surgery with a cesarean section  Patient is a 33 y.o. female presenting with vomiting. The history is provided by the patient, the spouse and medical records.  Emesis   Past Medical History  Diagnosis Date  . No diagnosis   . Benign liver cyst    Past Surgical History  Procedure Laterality Date  . Cesarean section     Family History  Problem Relation Age of Onset  . CAD Father   . Hypertension Father   . Diabetes Father    History  Substance Use Topics  . Smoking status: Never Smoker   . Smokeless tobacco: Not on file  . Alcohol Use: No   OB History   Grav Para Term Preterm Abortions TAB SAB Ect Mult Living   2 2 1       2      Review of Systems  Gastrointestinal: Positive for  vomiting.  All other systems reviewed and are negative.    Allergies  Erythromycin; Tetracycline; Other; and Sulfonamide derivatives  Home Medications   Current Outpatient Rx  Name  Route  Sig  Dispense  Refill  . omeprazole (PRILOSEC) 20 MG capsule   Oral   Take 1 capsule (20 mg total) by mouth daily.   30 capsule   1   . ondansetron (ZOFRAN) 4 MG tablet   Oral   Take 1 tablet (4 mg total) by mouth every 8 (eight) hours as needed for nausea or vomiting.   21 tablet   0   . dicyclomine (BENTYL) 20 MG tablet   Oral   Take 1 tablet (20 mg total) by mouth 2 (two) times daily.   20 tablet   0   . ondansetron (ZOFRAN ODT) 4 MG disintegrating tablet   Oral   Take 1 tablet (4 mg total) by mouth every 8 (eight) hours as needed for nausea.   10 tablet   0   . promethazine (PHENERGAN) 25 MG tablet   Oral   Take 1 tablet (25 mg total) by mouth every 6 (six) hours as needed for nausea or vomiting.   12 tablet   0    BP 106/63  Pulse 86  Temp(Src) 98.9 F (37.2 C) (Oral)  Resp 16  Ht 5\' 3"  (1.6 m)  Wt 106 lb (48.081  kg)  BMI 18.78 kg/m2  SpO2 100%  LMP 09/01/2013 Physical Exam  Nursing note and vitals reviewed. Constitutional: She appears well-developed and well-nourished. No distress.  HENT:  Head: Normocephalic and atraumatic.  Mouth/Throat: Oropharynx is clear and moist. No oropharyngeal exudate.  Eyes: Conjunctivae and EOM are normal. Pupils are equal, round, and reactive to light. Right eye exhibits no discharge. Left eye exhibits no discharge. No scleral icterus.  Neck: Normal range of motion. Neck supple. No JVD present. No thyromegaly present.  Cardiovascular: Normal rate, regular rhythm, normal heart sounds and intact distal pulses.  Exam reveals no gallop and no friction rub.   No murmur heard. Pulmonary/Chest: Effort normal and breath sounds normal. No respiratory distress. She has no wheezes. She has no rales.  Abdominal: Soft. Bowel sounds are normal.  She exhibits no distension and no mass. There is no tenderness.  Musculoskeletal: Normal range of motion. She exhibits no edema and no tenderness.  Lymphadenopathy:    She has no cervical adenopathy.  Neurological: She is alert. Coordination normal.  Skin: Skin is warm and dry. No rash noted. No erythema.  Psychiatric: She has a normal mood and affect. Her behavior is normal.    ED Course  Procedures (including critical care time) Labs Review Labs Reviewed  CBC WITH DIFFERENTIAL - Abnormal; Notable for the following:    WBC 16.3 (*)    Neutrophils Relative % 78 (*)    Neutro Abs 12.6 (*)    Monocytes Absolute 1.4 (*)    All other components within normal limits  COMPREHENSIVE METABOLIC PANEL - Abnormal; Notable for the following:    Glucose, Bld 112 (*)    All other components within normal limits  URINALYSIS, ROUTINE W REFLEX MICROSCOPIC - Abnormal; Notable for the following:    Color, Urine AMBER (*)    APPearance HAZY (*)    Hgb urine dipstick SMALL (*)    Bilirubin Urine SMALL (*)    Ketones, ur >80 (*)    All other components within normal limits  URINE MICROSCOPIC-ADD ON - Abnormal; Notable for the following:    Squamous Epithelial / LPF FEW (*)    Bacteria, UA FEW (*)    All other components within normal limits  LIPASE, BLOOD  PREGNANCY, URINE   Imaging Review No results found.  EKG Interpretation   None       MDM   1. Nausea   2. Abdominal cramping   3. Leukocytosis    On my exam the patient does not have any abdominal tenderness or guarding. She declines pain medication at this time. She has a urinalysis consistent with dehydration with 80+ ketones, specific gravity is high, no signs of infection in the urine. She also has a leukocytosis which is unexplained at this time however she has had similar findings in the past on her blood work. Her liver function and renal function appears normal, her abdominal exam is completely nontender. Pregnancy  negative  Pt informed of leukocytosis - she appears stable - non tender abd   Meds given in ED:  Medications  sodium chloride 0.9 % bolus 1,000 mL (0 mLs Intravenous Stopped 09/13/13 0533)  sodium chloride 0.9 % bolus 1,000 mL (0 mLs Intravenous Stopped 09/13/13 0533)  ondansetron (ZOFRAN) injection 4 mg (4 mg Intravenous Given 09/13/13 0400)  ketorolac (TORADOL) 30 MG/ML injection 30 mg (30 mg Intravenous Given 09/13/13 0413)    New Prescriptions   DICYCLOMINE (BENTYL) 20 MG TABLET    Take 1  tablet (20 mg total) by mouth 2 (two) times daily.   ONDANSETRON (ZOFRAN ODT) 4 MG DISINTEGRATING TABLET    Take 1 tablet (4 mg total) by mouth every 8 (eight) hours as needed for nausea.   PROMETHAZINE (PHENERGAN) 25 MG TABLET    Take 1 tablet (25 mg total) by mouth every 6 (six) hours as needed for nausea or vomiting.      Johnna Acosta, MD 09/13/13 [phone]

## 2013-10-18 ENCOUNTER — Inpatient Hospital Stay (HOSPITAL_COMMUNITY): Admission: RE | Admit: 2013-10-18 | Payer: BC Managed Care – PPO | Source: Ambulatory Visit

## 2013-10-26 ENCOUNTER — Ambulatory Visit (HOSPITAL_COMMUNITY)
Admission: RE | Admit: 2013-10-26 | Payer: BC Managed Care – PPO | Source: Ambulatory Visit | Admitting: Obstetrics and Gynecology

## 2013-10-26 ENCOUNTER — Encounter (HOSPITAL_COMMUNITY): Admission: RE | Payer: Self-pay | Source: Ambulatory Visit

## 2013-10-26 SURGERY — ESSURE TUBAL STERILIZATION
Anesthesia: Choice | Laterality: Bilateral

## 2013-12-05 ENCOUNTER — Other Ambulatory Visit (HOSPITAL_COMMUNITY): Payer: Self-pay | Admitting: Obstetrics and Gynecology

## 2013-12-05 DIAGNOSIS — Z308 Encounter for other contraceptive management: Secondary | ICD-10-CM

## 2014-02-07 ENCOUNTER — Other Ambulatory Visit: Payer: Self-pay | Admitting: Gastroenterology

## 2014-02-07 ENCOUNTER — Other Ambulatory Visit (HOSPITAL_COMMUNITY): Payer: Self-pay | Admitting: Gastroenterology

## 2014-02-07 ENCOUNTER — Ambulatory Visit (HOSPITAL_COMMUNITY): Payer: BC Managed Care – PPO

## 2014-02-07 DIAGNOSIS — K7689 Other specified diseases of liver: Secondary | ICD-10-CM

## 2014-02-08 ENCOUNTER — Ambulatory Visit (HOSPITAL_COMMUNITY)
Admission: RE | Admit: 2014-02-08 | Discharge: 2014-02-08 | Disposition: A | Discharge status: Home / Self Care | Payer: BC Managed Care – PPO | Source: Ambulatory Visit | Attending: Gastroenterology | Admitting: Gastroenterology

## 2014-02-08 DIAGNOSIS — R634 Abnormal weight loss: Secondary | ICD-10-CM | POA: Insufficient documentation

## 2014-02-08 DIAGNOSIS — K7689 Other specified diseases of liver: Secondary | ICD-10-CM

## 2014-02-08 DIAGNOSIS — R109 Unspecified abdominal pain: Secondary | ICD-10-CM | POA: Insufficient documentation

## 2014-02-08 DIAGNOSIS — N854 Malposition of uterus: Secondary | ICD-10-CM | POA: Insufficient documentation

## 2014-02-08 MED ORDER — IOHEXOL 300 MG/ML  SOLN
80.0000 mL | Freq: Once | INTRAMUSCULAR | Status: AC | PRN
  Administered 2014-02-08: 80 mL via INTRAVENOUS

## 2014-02-14 ENCOUNTER — Other Ambulatory Visit: Payer: Self-pay | Admitting: Gastroenterology

## 2014-02-14 LAB — HM COLONOSCOPY

## 2014-03-01 ENCOUNTER — Other Ambulatory Visit: Payer: Self-pay | Admitting: Gastroenterology

## 2014-03-01 ENCOUNTER — Ambulatory Visit (HOSPITAL_COMMUNITY)
Admission: RE | Admit: 2014-03-01 | Discharge: 2014-03-01 | Disposition: A | Discharge status: Home / Self Care | Payer: BC Managed Care – PPO | Source: Ambulatory Visit | Attending: Obstetrics and Gynecology | Admitting: Obstetrics and Gynecology

## 2014-03-01 DIAGNOSIS — R634 Abnormal weight loss: Secondary | ICD-10-CM

## 2014-03-01 DIAGNOSIS — R1084 Generalized abdominal pain: Secondary | ICD-10-CM

## 2014-03-01 DIAGNOSIS — Z308 Encounter for other contraceptive management: Secondary | ICD-10-CM

## 2014-03-01 DIAGNOSIS — Z3049 Encounter for surveillance of other contraceptives: Secondary | ICD-10-CM | POA: Insufficient documentation

## 2014-03-01 MED ORDER — IOHEXOL 300 MG/ML  SOLN
20.0000 mL | Freq: Once | INTRAMUSCULAR | Status: AC | PRN
  Administered 2014-03-01: 8 mL

## 2014-04-02 ENCOUNTER — Encounter: Payer: Self-pay | Admitting: *Deleted

## 2014-04-02 ENCOUNTER — Encounter: Payer: BC Managed Care – PPO | Attending: Family Medicine | Admitting: *Deleted

## 2014-04-02 VITALS — Ht 63.0 in | Wt 103.0 lb

## 2014-04-02 DIAGNOSIS — R634 Abnormal weight loss: Secondary | ICD-10-CM | POA: Insufficient documentation

## 2014-04-02 DIAGNOSIS — Z713 Dietary counseling and surveillance: Secondary | ICD-10-CM | POA: Insufficient documentation

## 2014-04-02 NOTE — Progress Notes (Signed)
Medical Nutrition Therapy:  Appt start time: 1600 end time:  1700.  Assessment:  Primary concerns today: Stacy [name] is here with her husband for nutrition counseling pertaining to abnormal weight loss.  After her most recent pregnancy 3 years ago, she kept losing weight unintentionally.  Normal weight is 120 lb, lost down to 96 pounds.  She currently weighs 101, (2 weeks ago she was 103 and she unintentionally lost 2 pounds).  She wants to gain weight.  She has issues with reflux and she was vomiting.  She is now on medication, but if she forgets to take it, she gets sick.  If she eats anything acidic she will throw up: Tomatoes, lemon, denies issues with greasy foods.  If she overeats she is uncomfortable.  She is using My Fitness Pal to track her calories and she eats 2000 calories.  Fiorella lives at home with her husband and 2 kids.  She does the cooking and grocery shopping.  She uses a variety of cooking methods, but she doest' fry that often.  She bakes, grills, broils, and cooks on the stove top.  Goes out to lunch more often, but eats dinner at home.  Husband is Arabic and follows a more Mediterranean style meal plan, whereas Arlett is from [place]  Preferred Learning Style:   No preference indicated   Learning Readiness:   Change in progress   MEDICATIONS: see list   DIETARY INTAKE:  Usual eating pattern includes 3 meals and 0-3 snacks per day.  Everyday foods include loves fruit and vegetables, but is afraid to eat healthy.  Avoided foods include tomatoes, lemon.    24-hr recall:  B ( AM): in the summer: bacon, pita bread or sliced bread with eggs; cereal (fruity pebbles)  Snk ( AM): maybe Ensure or peanut butter crackers or chips. Sometimes fruit  L ( PM): japanese: meat and rice Snk ( PM): not usually- maybe hummus D ( PM): rice and meat with vegetable with plain yogurt and salad Snk ( PM): maybe [employer] or chips or Ensure Beverages: 2 Ensure, water, 1 soda/day  Usual  physical activity: not outside of ADLs- runs around after 2 kids  Estimated energy needs: 2600 calories 325 g carbohydrates 130 g protein 87 g fat    Nutritional Diagnosis:  [facility]-3.2 Unintentional weight loss As related to inadequate calorie intake.  As evidenced by 20 pound weight loss.    Intervention:  Nutrition counseling provided.  Recommended 2500-3000 calories/day to promote weight gain of 1/2 pound+/week  Goals: Aim for 3 meals and 3 snacks Each meal to contain starch, protein, dairy, fruit or vegetable Try chocolate whole milk 3 times a day as your dairy Try non-water beverages like milk or juice Use condiments as calorie boosters  Protein: size of palm of hand Starch: size of fist Dairy: 8 oz glass milk, 1 oz cheese, 4 oz yogurt Fruits and veggies are free!  B:  Pita, bacon, yogurt with fruit; eggs with bread, milk, and banana; cereal with fruit in it or glass of juice (drink milk from cereal), slice of bacon; packet of oatmeal, glass of chocolate milk, peanut butter with apple S: apples and peanut butter or crackers and peanut butter; Ensure, greek yogurt L: burrito with guac; grilled chicken taco salad; japanese; salad with crackers or croutons; chicken tenders on bread with cheese with salad S: apples and peanut butter or crackers and peanut butter; Ensure, greek yogurt D: spinach and rice with meat; potatoes with beef and vegetables  S: cheezits, cheese, and tea  Teaching Method Utilized:  Auditory Hands on   Barriers to learning/adherence to lifestyle change: premature satiety  Demonstrated degree of understanding via:  Teach Back   Monitoring/Evaluation:  Dietary intake, exercise, and body weight in 1 month(s).

## 2014-04-02 NOTE — Patient Instructions (Addendum)
Aim for 3 meals and 3 snacks Each meal to contain starch, protein, dairy, fruit or vegetable Try chocolate whole milk 3 times a day as your dairy Try non-water beverages like milk or juice Use condiments as calorie boosters  Protein: size of palm of hand Starch: size of fist Dairy: 8 oz glass milk, 1 oz cheese, 4 oz yogurt Fruits and veggies are free!  B:  Pita, bacon, yogurt with fruit; eggs with bread, milk, and banana; cereal with fruit in it or glass of juice (drink milk from cereal), slice of bacon; packet of oatmeal, glass of chocolate milk, peanut butter with apple S: apples and peanut butter or crackers and peanut butter; Ensure, greek yogurt L: burrito with guac; grilled chicken taco salad; japanese; salad with crackers or croutons; chicken tenders on bread with cheese with salad S: apples and peanut butter or crackers and peanut butter; Ensure, greek yogurt D: spinach and rice with meat; potatoes with beef and vegetables S: cheezits, cheese, and tea

## 2014-04-05 ENCOUNTER — Ambulatory Visit
Admission: RE | Admit: 2014-04-05 | Discharge: 2014-04-05 | Disposition: A | Discharge status: Home / Self Care | Payer: BC Managed Care – PPO | Source: Ambulatory Visit | Attending: Gastroenterology | Admitting: Gastroenterology

## 2014-04-05 DIAGNOSIS — R634 Abnormal weight loss: Secondary | ICD-10-CM

## 2014-04-05 DIAGNOSIS — R1084 Generalized abdominal pain: Secondary | ICD-10-CM

## 2014-05-07 ENCOUNTER — Ambulatory Visit: Payer: BC Managed Care – PPO | Admitting: *Deleted

## 2014-06-17 ENCOUNTER — Encounter: Payer: Self-pay | Admitting: *Deleted

## 2014-06-24 ENCOUNTER — Encounter: Payer: BC Managed Care – PPO | Attending: Family Medicine | Admitting: *Deleted

## 2014-06-24 VITALS — Wt 105.0 lb

## 2014-06-24 DIAGNOSIS — Z713 Dietary counseling and surveillance: Secondary | ICD-10-CM | POA: Insufficient documentation

## 2014-06-24 DIAGNOSIS — R634 Abnormal weight loss: Secondary | ICD-10-CM | POA: Diagnosis present

## 2014-06-24 NOTE — Progress Notes (Signed)
Medical Nutrition Therapy:  Appt start time: 1500 end time:  3734.  Assessment:  Primary concerns today: Lucerito is here for follow up nutrition counseling pertaining to abnormal weight loss and difficulty gaining weight back. She has attempted to consume 2500 calories/day, but got sick.  She's been doing 2000-2200 without issue.  If she eats too much, she will throw up.  She is using My Fitness Pal to track her calories and she finds this helpful to make sure she gets enough each day.  She states she is trying to do her best   Hpi: Norene is here with her husband for nutrition counseling pertaining to abnormal weight loss.  After her most recent pregnancy 3 years ago, she kept losing weight unintentionally.  Normal weight is 120 lb, lost down to 96 pounds.  She currently weighs 101, (2 weeks ago she was 103 and she unintentionally lost 2 pounds).  She wants to gain weight.  She has issues with reflux and she was vomiting.  She is now on medication, but if she forgets to take it, she gets sick.  If she eats anything acidic she will throw up: Tomatoes, lemon, denies issues with greasy foods.  If she overeats she is uncomfortable.  She is using My Fitness Pal to track her calories and she eats 2000 calories.  Athalene lives at home with her husband and 2 kids.  She does the cooking and grocery shopping.  She uses a variety of cooking methods, but she doest' fry that often.  She bakes, grills, broils, and cooks on the stove top.  Goes out to lunch more often, but eats dinner at home.  Husband is Arabic and follows a more Mediterranean style meal plan, whereas Janelle is from [place]  Preferred Learning Style:   No preference indicated   Learning Readiness:   Change in progress   MEDICATIONS: see list   DIETARY INTAKE: Usual eating pattern includes 3 meals and 2-3 snacks per day.  Everyday foods include loves fruit and vegetables, but is afraid to eat healthy.  Avoided foods include tomatoes, lemon.     24-hr recall:  B: 2 package of instant oatmeal  L: grilled chicken taco salad with chips and salsa and cheese dip D: pasta with chicken and alfredo sauce S: Ensure, yogurt, cheese, cheezits or chips (2 snacks, sometimes 3) Beverages: water, tea, juice, coffee sometimes.   Usual physical activity: not outside of ADLs- runs around after 2 kids  Estimated energy needs: 2600 calories 325 g carbohydrates 130 g protein 87 g fat    Nutritional Diagnosis:  [place]-3.2 Unintentional weight loss As related to inadequate calorie intake.  As evidenced by 20 pound weight loss.    Intervention:  Nutrition counseling provided.  Recommended 2200/day and advancing 200-300 calories each month for ultimate goal of 2500-3000 to reach weight goal of 120  Goals: Aim for 3 meals and 3 snacks Each meal to contain starch, protein, dairy, fruit or vegetable Try chocolate whole milk 3 times a day as your dairy Try non-water beverages like milk or juice Use condiments as calorie boosters  Protein: size of palm of hand Starch: size of fist Dairy: 8 oz glass milk, 1 oz cheese, 4 oz yogurt Fruits and veggies are free!  B:  Pita, bacon, yogurt with fruit; eggs with bread, milk, and banana; cereal with fruit in it or glass of juice (drink milk from cereal), slice of bacon; packet of oatmeal, glass of chocolate milk, peanut butter with apple  S: apples and peanut butter or crackers and peanut butter; Ensure, greek yogurt L: burrito with guac; grilled chicken taco salad; japanese; salad with crackers or croutons; chicken tenders on bread with cheese with salad S: apples and peanut butter or crackers and peanut butter; Ensure, greek yogurt D: spinach and rice with meat; potatoes with beef and vegetables S: cheezits, cheese, and tea  Teaching Method Utilized:  Auditory    Barriers to learning/adherence to lifestyle change: premature satiety/GI distress  Demonstrated degree of understanding via:  Teach Back    Monitoring/Evaluation:  Dietary intake, exercise, and body weight in 1 month(s).

## 2014-08-06 ENCOUNTER — Ambulatory Visit: Payer: BC Managed Care – PPO | Admitting: *Deleted

## 2014-08-27 ENCOUNTER — Encounter: Payer: BLUE CROSS/BLUE SHIELD | Attending: Family Medicine | Admitting: *Deleted

## 2014-08-27 VITALS — Wt 101.0 lb

## 2014-08-27 DIAGNOSIS — Z713 Dietary counseling and surveillance: Secondary | ICD-10-CM | POA: Diagnosis not present

## 2014-08-27 DIAGNOSIS — R634 Abnormal weight loss: Secondary | ICD-10-CM | POA: Insufficient documentation

## 2014-08-27 DIAGNOSIS — R636 Underweight: Secondary | ICD-10-CM

## 2014-08-27 NOTE — Progress Notes (Signed)
  Medical Nutrition Therapy:  Appt start time: 1500 end time:  6834.  Assessment:  Primary concerns today: Stacy [name] is here for follow up nutrition counseling pertaining to abnormal weight loss and difficulty gaining weight back. She saw her PCP in December and that went well and she didn't need to come back until April.  However, according to my scale, she's lost 4 pounds.  She hasn't really implemented nutrition recommendations and is eating about the same  She has attempted to consume 2300 calories/day.  She's been doing 2000-2200 without issue. Her GI symptoms have improved a lot. She is using My Fitness Pal to track her calories and she finds this helpful to make sure she gets enough each day.  She states she is trying to do her best  DIETARY INTAKE: Usual eating pattern includes 3 meals and 0-1snacks per day. Everyday foods include all kinds.  Avoided foods include tomatoes, lemon.    Usual physical activity: not outside of ADLs- runs around after 2 kids Dietary recall: Ensure B: Sausage and biscuit and eggs L: chicken tenders, shrimp spring roll, chicken wings S: peanut butter cracker D: cheese fries, bloomin onion, talapia, mashed potatoes Beverages: water, sprite   Preferred Learning Style:   No preference indicated   Learning Readiness:   Change in progress   MEDICATIONS: see list    Estimated energy needs: 2600 calories 325 g carbohydrates 130 g protein 87 g fat    Nutritional Diagnosis:  [place]-3.2 Unintentional weight loss As related to inadequate calorie intake.  As evidenced by 20 pound weight loss.    Intervention:  Nutrition counseling provided.  Recommended 2800/day and advancing 200-300 calories each month for ultimate goal of 2500-3000 to reach weight goal of 120.  She will not get 2800 kcal/day I'm sure, but a dramatic recommendation might stir her to get more than she has been getting the past several months.  Suggested increasing calories through beverages  as to not promote overfullness.  Recommended juice, lemonade, milk with all her meals to give her an additional ~500kcal.day  Previous Goals: Aim for 3 meals and 3 snacks Each meal to contain starch, protein, dairy, fruit or vegetable Try chocolate whole milk 3 times a day as your dairy Try non-water beverages like milk or juice Use condiments as calorie boosters  Protein: size of palm of hand Starch: size of fist Dairy: 8 oz glass milk, 1 oz cheese, 4 oz yogurt Fruits and veggies are free!  B:  Pita, bacon, yogurt with fruit; eggs with bread, milk, and banana; cereal with fruit in it or glass of juice (drink milk from cereal), slice of bacon; packet of oatmeal, glass of chocolate milk, peanut butter with apple S: apples and peanut butter or crackers and peanut butter; Ensure, greek yogurt L: burrito with guac; grilled chicken taco salad; japanese; salad with crackers or croutons; chicken tenders on bread with cheese with salad S: apples and peanut butter or crackers and peanut butter; Ensure, greek yogurt D: spinach and rice with meat; potatoes with beef and vegetables S: cheezits, cheese, and tea  Teaching Method Utilized:  Auditory    Barriers to learning/adherence to lifestyle change: premature satiety/GI distress  Demonstrated degree of understanding via:  Teach Back   Monitoring/Evaluation:  Dietary intake, exercise, and body weight in 1 month(s).

## 2014-10-01 ENCOUNTER — Encounter: Payer: BLUE CROSS/BLUE SHIELD | Attending: Family Medicine | Admitting: *Deleted

## 2014-10-01 VITALS — Wt 102.8 lb

## 2014-10-01 DIAGNOSIS — Z713 Dietary counseling and surveillance: Secondary | ICD-10-CM | POA: Insufficient documentation

## 2014-10-01 DIAGNOSIS — R634 Abnormal weight loss: Secondary | ICD-10-CM | POA: Insufficient documentation

## 2014-10-01 NOTE — Patient Instructions (Signed)
Aim for 3 meals and 3 snacks each day Drink 2 Ensure each day Try to get milk, juice, lemonade each day Add those calorie boosters like oil in your cooking Guacamole/avocado, peanut butter/nuts are good snacks Add cheese to foods too

## 2014-10-01 NOTE — Progress Notes (Signed)
  Medical Nutrition Therapy:  Appt start time: 1500 end time:  7096.  Assessment:  Primary concerns today: Stacy [name] is here for follow up nutrition counseling pertaining to abnormal weight loss and difficulty gaining weight back.  Things have been really busy with planning  A wedding and her kids having the stomach flu.  She is eating much more than she has in the past, but she is really frustrated that she's not gaining weight.  She is meeting my recommendations now in terms of calories (~2800-3000), but she hasn't gained weight.     DIETARY INTAKE: Usual eating pattern includes 3 meals and 2-3 snacks per day. Everyday foods include all kinds.  Avoided foods include tomatoes, lemon.    Usual physical activity: not outside of ADLs- runs around after 2 kids Dietary recall: B: banana, humus with pita.  Ensure L: steak burrito with cheese, veggies, rice, guac, chips and salsa S: milkshake   Preferred Learning Style:   No preference indicated   Learning Readiness:   Change in progress   MEDICATIONS: see list    Estimated energy needs: 2600 calories 325 g carbohydrates 130 g protein 87 g fat    Nutritional Diagnosis:  [place]-3.2 Unintentional weight loss As related to inadequate calorie intake.  As evidenced by 20 pound weight loss.    Intervention:  Nutrition counseling provided. Stacy [name] may have a hyperactive metabolism.  She also might have some underlying medical concern like thyroid disease or Celiac Disease or diabetes that is causing this weight struggle.  She says she's been tested for everything and her labs came back normal.  Her mom was 95 pounds for much of her life and started to gain weight in her late 3s early 78s.  Dad is also very slim as are Stacy [name]'s kids.  She might be genetically slim.  As long as she is healthy and functioning well, her weight doesn't matter to me.  I think she will always need a hypercaloric diet because if she eats any less than about 2800 calories  she looses weight.     Previous Goals: Aim for 3 meals and 3 snacks Each meal to contain starch, protein, dairy, fruit or vegetable Drink 2 Ensure each day Try non-water beverages like milk or juice Use condiments as calorie boosters   Sample meal plan B:  Pita, bacon, yogurt with fruit; eggs with bread, milk, and banana; cereal with fruit in it or glass of juice (drink milk from cereal), slice of bacon; packet of oatmeal, glass of chocolate milk, peanut butter with apple S: apples and peanut butter or crackers and peanut butter; Ensure, greek yogurt L: burrito with guac; grilled chicken taco salad; japanese; salad with crackers or croutons; chicken tenders on bread with cheese with salad S: apples and peanut butter or crackers and peanut butter; Ensure, greek yogurt D: spinach and rice with meat; potatoes with beef and vegetables S: cheezits, cheese, and tea  Teaching Method Utilized:  Auditory    Barriers to learning/adherence to lifestyle change: none  Demonstrated degree of understanding via:  Teach Back   Monitoring/Evaluation:  Dietary intake, exercise, and body weight prn after doctor's appointment in April.

## 2015-06-04 ENCOUNTER — Other Ambulatory Visit: Payer: Self-pay | Admitting: Gastroenterology

## 2015-06-04 DIAGNOSIS — K769 Liver disease, unspecified: Secondary | ICD-10-CM

## 2015-06-26 ENCOUNTER — Ambulatory Visit
Admission: RE | Admit: 2015-06-26 | Discharge: 2015-06-26 | Disposition: A | Discharge status: Home / Self Care | Payer: 59 | Source: Ambulatory Visit | Attending: Gastroenterology | Admitting: Gastroenterology

## 2015-06-26 ENCOUNTER — Other Ambulatory Visit: Payer: Self-pay | Admitting: Gastroenterology

## 2015-06-26 DIAGNOSIS — K7689 Other specified diseases of liver: Secondary | ICD-10-CM

## 2015-06-26 MED ORDER — IOPAMIDOL (ISOVUE-300) INJECTION 61%
97.0000 mL | Freq: Once | INTRAVENOUS | Status: AC | PRN
  Administered 2015-06-26: 97 mL via INTRAVENOUS

## 2015-10-06 ENCOUNTER — Ambulatory Visit: Payer: 59 | Admitting: Family Medicine

## 2015-10-14 ENCOUNTER — Ambulatory Visit (INDEPENDENT_AMBULATORY_CARE_PROVIDER_SITE_OTHER): Payer: 59 | Admitting: Family Medicine

## 2015-10-14 ENCOUNTER — Encounter: Payer: Self-pay | Admitting: Family Medicine

## 2015-10-14 VITALS — Ht 63.0 in | Wt 88.3 lb

## 2015-10-14 DIAGNOSIS — R634 Abnormal weight loss: Secondary | ICD-10-CM | POA: Diagnosis not present

## 2015-10-14 NOTE — Patient Instructions (Addendum)
-   Bring your medications list with dosages to follow-up appt.   - At your next opportunity, ask your PCP or gyn to test your vitamin D.   Goals: 1. Eat at least 3 REAL meals and 1-2 snacks per day.  Aim for no more than 5 hours between eating.  Eat breakfast within one hour of getting up.   A REAL meal includes at least protein, starch, and veg's and/or fruit.    OR:  Would you serve this to a guest in your home, and call it a meal?  Breakfast:  Look for cereals with at least 5 g of fiber per serving AND make sure you have at least 1 full cup of milk or yogurt.    What will make or break success in this goal is advance planning:  Planning for TOMORROW needs to take place TODAY:    For example, think about tomorrow's lunch:  Where will I be around lunch time?; When can I break for lunch?; What will I have? 2. Use liberally:  Olive oil, nuts, seeds, nut butters, dried fruit, fruit juices, cheese sticks, any other high-calorie foods you have been advised to use before.    Make sure you keep these foods on hand, especially snack foods that are portable.    Make your snack along with the boys' lunches each weekday.   3. Continue food record daily, including What you eat; What amount; What time.    Review every couple of days:  Given the same circumstances, what might I do differently?

## 2015-10-14 NOTE — Progress Notes (Signed)
Medical Nutrition Therapy:  Appt start time: [id number] end time:  1630. PCP   [name], MD Counselor  Lilyan Punt, [facility], [place]  Assessment:   Stacy [name] was referred by counselor Lilyan Punt, whom she has recently started seeing again for help in dealing with stress and anxiety.  Stacy [name] has experienced a significant weight loss since 2013, only a small portion of which she has managed to regain.  She did see RD Ozzie Hoyle in 2015 (see chart), and gained up to 105, but could not get above that weight.  Although her counselor is concerned about disordered eating, this was not raised at previous MNT appts, at which Stacy [name] insisted she was trying to regain the weight she'd lost.   Stacy [name]'s weight loss started with an intentional effort to lose; went from 145 lb to 112 lb in 18 mo; after reaching 112 lb, wt loss was unintentional, and she is now only 88 lb, and wants to regain.   She has been working on anxiety and responses to stress with Lilyan Punt, which she said has been very helpful.  Anxiety may play a role in her not eating enough, i.e., seems to often stress about what needs to be done, and many things take priority over feeding herself adequately.  It is difficult to say definitively if Stacy [name]'s weight loss includes an eating disorder component, but this may become clearer as she works on the recommendations provided today.    Barriers to learning/adherence to lifestyle change:  Anxiety; time constraints.  Primary concerns today: unintentional weight loss and evaluation for disordered eating.   Learning Readiness:  Ready  Usual eating pattern includes 3 meals and 1-2 snacks per day. Frequent foods and beverages include 12 oz soda, 6 oz Kool-Aid, [place] sausage burrito/cereal w/ 2% milk, hummus, pita, plain yogurt, pinenuts, bacon, veg's 3-4 X wk, fruit 1-2 X wk.  Avoided foods include raspberry (rash); okra (disliked), and tomato sauce, spicy foods, salsa, and lemon (reflux).   Usual  physical activity includes "running after kids."  Dr. Zadie Rhine has advised against exercise until she can gain some weight.  Usual weekday schedule includes:  Up at 6 AM; at 7:40 AM leaves to drive 6-YO to school in [place]; then to [place] to drop off 3-YO at daycare by 9 AM; then goes home, to husband's office, or runs errands before picking up 3-YO at noon; then lunch with husband and son or go home; pick up 6-YO by 3:30 PM; drive to activities 2-3 X wk; make dinner; eat by ~6 PM.  Stacy [name] gets to be usually between 8:30 and 10:30 PM.   24-hr recall: (Up at 6:15 AM; water ~7 AM) B (7:30 AM)-   1 c Fruity Pebbles, 1/2 c 2% milk Snk (9:30)-  1 fast food sausage&chs bisc, 1/2 reg ff's, 4 oz Sprite Snk (10:15)-   12 oz St[employer]bucks sweet tea, Kind Bar L (12:30 PM)-  3 Chick-fil[employer]A chx strip, 8 oz Sprite Snk ( PM)-  --- D (6 PM)-  1 chsbgr, salad, 1 T bacon bits, 1/2 T chs, 1 T olives, 1 1/2 T ranch drsng, water Snk ( PM)-  --- Typical day? No.  Doesn't usually have extra breakfast, and usually has a more normal lunch, likely takeout or restaurant food.    Progress Towards Goal(s):  In progress.   Nutritional Diagnosis:  [place]-3.1 Underweight As related to poor appetite.  As evidenced by BMI <16.    Intervention:  Nutrition education.  Handouts given during visit  include:  AVS  Demonstrated degree of understanding via:  Teach Back   Monitoring/Evaluation:  Dietary intake, exercise, and body weight in 6 week(s).  No appt available sooner.

## 2015-11-25 ENCOUNTER — Ambulatory Visit: Payer: 59 | Admitting: Family Medicine

## 2015-12-09 ENCOUNTER — Ambulatory Visit (INDEPENDENT_AMBULATORY_CARE_PROVIDER_SITE_OTHER): Payer: 59 | Admitting: Family Medicine

## 2015-12-09 ENCOUNTER — Encounter: Payer: Self-pay | Admitting: Family Medicine

## 2015-12-09 VITALS — Ht 63.0 in | Wt 88.2 lb

## 2015-12-09 DIAGNOSIS — R634 Abnormal weight loss: Secondary | ICD-10-CM | POA: Diagnosis not present

## 2015-12-09 NOTE — Patient Instructions (Signed)
Recommendations - Make a conscious effort to add some high-quality fat to each meal.    - Avocado, olive oil, nuts & seeds, fatty fish (not fried).    - For example, add 2-3 tbsp nuts/seeds to cereal.    - When you have hummus, go for at least 3 tbsp.   - Continue to make sure you have snacks on hand throughout the day, i.e., protein bars, mixed nuts/trail mix, fruit, yogurt.   - IMPORTANT:  Continue getting (and planning ahead for) three REAL meals a day, and at least 2 snacks.    - REVIEW food records: Did I eat enough yesterday?  Given the same circumstances, what could I have done differently to get more calories?

## 2015-12-09 NOTE — Progress Notes (Signed)
Medical Nutrition Therapy:  Appt start time: 1000 end time:  1100. PCP   [name], MD Counselor  Lilyan Punt, [facility], [place]  Assessment:   Stacy [name] is feeling much better; no nausea, sleeping through the night, and she is dealing with her stresses well.  Anxiety has diminished considerably, as she is better able to use the tools she has learned from therapist Lilyan Punt.  She said she is eating breakfast at home consistently (not grabbing something on way out the door), as well as other meals, although she still occasionally stops for a Chick-Fil-A breakfast.  She is also usually bringing snacks with her as needed during the day.  She was surprised to see her weight had not gone up, but she has not added a lot of fats to her diet.  We talked today of her need to compensate for the energy deficit she has experienced during previous months of restriction.    Physical activity is still limited to "running after the kids."   Consistent times of hunger are in the afternoons.  This is usually when she is driving her kids around.  Kcal intake is still inadequate to meet both daily energy needs and energy demands of weight gain.    24-hr recall suggests intake of 1500 kcal:  (Up at 6 AM) B (7:15 AM)-  2 large pc [place] sausage, 1/2 large pitabread, 1 T hummus, 4 str'berr's, 1 c cranberry jc  450 Snk (9 AM)-  Water, 1/3 c blueberries            20 L (12:30 PM)-  Zoe's Kitch:  1 steak rollup, baked chips, water       450 Snk ( PM)-  --- D (5:30 PM)-  Mexican:  1/2 shrimp quesadilla w/ onions, let, tom, guac, rice, 4 chips, 1 tsp beef-chs-bean dip 400 Snk (9:30)-  10 [employer], water          180 Typical day? Yes.    Progress Towards Goal(s):  In progress.   Nutritional Diagnosis:  Slight progress on [place]-3.1 Underweight As related to poor appetite.  As evidenced by increased food frequency despite BMI <16.    Intervention:  Nutrition education.  Handouts given during visit  include:  AVS  Demonstrated degree of understanding via:  Teach Back   Monitoring/Evaluation:  Dietary intake, exercise, and body weight weight check in 5 weeks.  No appt available sooner.  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

## 2016-01-15 ENCOUNTER — Encounter (HOSPITAL_BASED_OUTPATIENT_CLINIC_OR_DEPARTMENT_OTHER): Payer: Self-pay | Admitting: *Deleted

## 2016-01-15 ENCOUNTER — Emergency Department (HOSPITAL_BASED_OUTPATIENT_CLINIC_OR_DEPARTMENT_OTHER)
Admission: EM | Admit: 2016-01-15 | Discharge: 2016-01-15 | Disposition: A | Discharge status: Home / Self Care | Payer: 59 | Attending: Emergency Medicine | Admitting: Emergency Medicine

## 2016-01-15 ENCOUNTER — Emergency Department (HOSPITAL_BASED_OUTPATIENT_CLINIC_OR_DEPARTMENT_OTHER): Payer: 59

## 2016-01-15 DIAGNOSIS — R1031 Right lower quadrant pain: Secondary | ICD-10-CM | POA: Diagnosis present

## 2016-01-15 DIAGNOSIS — R109 Unspecified abdominal pain: Secondary | ICD-10-CM

## 2016-01-15 DIAGNOSIS — K5900 Constipation, unspecified: Secondary | ICD-10-CM | POA: Diagnosis not present

## 2016-01-15 LAB — URINE MICROSCOPIC-ADD ON

## 2016-01-15 LAB — CBC WITH DIFFERENTIAL/PLATELET
Basophils Absolute: 0 K/uL (ref 0.0–0.1)
Basophils Relative: 0 %
Eosinophils Absolute: 0.1 K/uL (ref 0.0–0.7)
Eosinophils Relative: 1 %
HCT: 37.2 % (ref 36.0–46.0)
Hemoglobin: 12.6 g/dL (ref 12.0–15.0)
Lymphocytes Relative: 24 %
Lymphs Abs: 2.5 K/uL (ref 0.7–4.0)
MCH: 29.6 pg (ref 26.0–34.0)
MCHC: 33.9 g/dL (ref 30.0–36.0)
MCV: 87.3 fL (ref 78.0–100.0)
Monocytes Absolute: 1 K/uL (ref 0.1–1.0)
Monocytes Relative: 9 %
Neutro Abs: 6.9 K/uL (ref 1.7–7.7)
Neutrophils Relative %: 66 %
Platelets: 325 K/uL (ref 150–400)
RBC: 4.26 MIL/uL (ref 3.87–5.11)
RDW: 12.3 % (ref 11.5–15.5)
WBC: 10.5 K/uL (ref 4.0–10.5)

## 2016-01-15 LAB — URINALYSIS, ROUTINE W REFLEX MICROSCOPIC
Glucose, UA: NEGATIVE mg/dL
Ketones, ur: 40 mg/dL — AB
Leukocytes, UA: NEGATIVE
Nitrite: NEGATIVE
Protein, ur: NEGATIVE mg/dL
Specific Gravity, Urine: 1.041 — ABNORMAL HIGH (ref 1.005–1.030)
pH: 5.5 (ref 5.0–8.0)

## 2016-01-15 LAB — BASIC METABOLIC PANEL
Anion gap: 8 (ref 5–15)
BUN: 16 mg/dL (ref 6–20)
CALCIUM: 8.9 mg/dL (ref 8.9–10.3)
CO2: 27 mmol/L (ref 22–32)
Chloride: 99 mmol/L — ABNORMAL LOW (ref 101–111)
Creatinine, Ser: 0.63 mg/dL (ref 0.44–1.00)
GFR calc Af Amer: >60 mL/min (ref >60)
GLUCOSE: 102 mg/dL — AB (ref 65–99)
Potassium: 3.7 mmol/L (ref 3.5–5.1)
SODIUM: 134 mmol/L — AB (ref 135–145)

## 2016-01-15 LAB — HEPATIC FUNCTION PANEL
ALT: 13 U/L — ABNORMAL LOW (ref 14–54)
AST: 14 U/L — ABNORMAL LOW (ref 15–41)
Albumin: 3.6 g/dL (ref 3.5–5.0)
Alkaline Phosphatase: 72 U/L (ref 38–126)
Bilirubin, Direct: <0.1 mg/dL — ABNORMAL LOW (ref 0.1–0.5)
Total Bilirubin: 1.1 mg/dL (ref 0.3–1.2)
Total Protein: 6.9 g/dL (ref 6.5–8.1)

## 2016-01-15 LAB — PREGNANCY, URINE: Preg Test, Ur: NEGATIVE

## 2016-01-15 LAB — LIPASE, BLOOD: Lipase: 18 U/L (ref 11–51)

## 2016-01-15 MED ORDER — LACTULOSE 10 GM/15ML PO SOLN
20.0000 g | Freq: Once | ORAL | Status: AC
  Administered 2016-01-15: 20 g via ORAL

## 2016-01-15 MED ORDER — ONDANSETRON HCL 4 MG/2ML IJ SOLN
INTRAMUSCULAR | Status: AC
  Filled 2016-01-15: qty 2

## 2016-01-15 MED ORDER — ONDANSETRON HCL 4 MG/2ML IJ SOLN
4.0000 mg | Freq: Once | INTRAMUSCULAR | Status: AC
  Administered 2016-01-15: 4 mg via INTRAVENOUS

## 2016-01-15 MED ORDER — HYDROCODONE-ACETAMINOPHEN 5-325 MG PO TABS: 1.0000 | ORAL_TABLET | Freq: Once | ORAL | Status: DC

## 2016-01-15 MED ORDER — LACTULOSE 10 GM/15ML PO SOLN
ORAL | Status: AC
  Filled 2016-01-15: qty 30

## 2016-01-15 MED ORDER — POLYETHYLENE GLYCOL 3350 17 G PO PACK
17.0000 g | PACK | Freq: Every day | ORAL | Status: DC
  Filled 2016-01-15: qty 1

## 2016-01-15 MED ORDER — IOPAMIDOL (ISOVUE-300) INJECTION 61%
86.0000 mL | Freq: Once | INTRAVENOUS | Status: AC | PRN
  Administered 2016-01-15: 86 mL via INTRAVENOUS

## 2016-01-15 MED ORDER — ALPRAZOLAM 0.5 MG PO TABS
0.2500 mg | ORAL_TABLET | Freq: Once | ORAL | Status: AC
  Administered 2016-01-15: 0.25 mg via ORAL
  Filled 2016-01-15: qty 1

## 2016-01-15 MED ORDER — SODIUM CHLORIDE 0.9 % IV BOLUS (SEPSIS)
1000.0000 mL | Freq: Once | INTRAVENOUS | Status: AC
Start: 2016-01-15 — End: 2016-01-15
  Administered 2016-01-15: 1000 mL via INTRAVENOUS

## 2016-01-15 NOTE — ED Notes (Signed)
Husband to nsg desk stating pt vomiting after drinking contrast. PA made aware, orders received and initiated.

## 2016-01-15 NOTE — ED Notes (Signed)
Right lower quadrant pain. She was sent from her MD's office to r/o appendicitis.

## 2016-01-15 NOTE — Discharge Instructions (Signed)
Call [facility] in the morning to schedule your follow-up appointment. Apply heat for pain relief. Take lactulose as distracted by your primary care provider.  Please seek immediate care if you develop any of the following symptoms: The pain does not go away.  You have a fever.  You keep throwing up (vomiting).  You pass bloody or black tarry stools.  There is bright red blood in the stool.  The constipation stays for more than 4 days.  There is rectal pain.  You do not seem to be getting better.  You have any questions or concerns.

## 2016-01-15 NOTE — ED Provider Notes (Signed)
CSN: [id number]     Arrival date & time 01/15/16  1611 History   First MD Initiated Contact with Patient 01/15/16 1629     Chief Complaint  Patient presents with  . Abdominal Pain    (Consider location/radiation/quality/duration/timing/severity/associated sxs/prior Treatment) Patient is a 35 y.o. female presenting with abdominal pain. The history is provided by the patient and medical records. No language interpreter was used.  Abdominal Pain Associated symptoms: constipation   Associated symptoms: no chills, no cough, no diarrhea, no dysuria, no fever, no nausea, no shortness of breath and no vomiting     ELEYNA LISI is a 35 y.o. female  with a PMH of eating disorder currently in remission, benign liver cyst who was sent to the Emergency Department by her primary care provider for right lower quadrant abdominal pain. Patient states she had a generalized, vague abdominal pain the last 3 days which she assumed was 2/2 constipation - no good BM in 6 days. Today pain worsened and localized more to the right so she saw her primary doctor who recommend a CT to rule out appendicitis. Denies fever. No nausea, vomiting, dysuria, chest pain, shortness of breath. No medications taken for symptoms prior to arrival. No alleviating or aggravating factors were negative.  Past Medical History  Diagnosis Date  . No diagnosis   . Benign liver cyst   . GERD (gastroesophageal reflux disease)    Past Surgical History  Procedure Laterality Date  . Cesarean section     Family History  Problem Relation Age of Onset  . CAD Father   . Hypertension Father   . Diabetes Father    Social History  Substance Use Topics  . Smoking status: Never Smoker   . Smokeless tobacco: None  . Alcohol Use: No   OB History    Gravida Para Term Preterm AB TAB SAB Ectopic Multiple Living   2 2 1       2      Review of Systems  Constitutional: Negative for fever and chills.  HENT: Negative for congestion.   Eyes:  Negative for visual disturbance.  Respiratory: Negative for cough and shortness of breath.   Cardiovascular: Negative.   Gastrointestinal: Positive for abdominal pain and constipation. Negative for nausea, vomiting and diarrhea.  Genitourinary: Negative for dysuria.  Musculoskeletal: Negative for back pain and neck pain.  Skin: Negative for rash.  Neurological: Negative for headaches.      Allergies  Erythromycin; Tetracycline; Other; and Sulfonamide derivatives  Home Medications   Prior to Admission medications   Medication Sig Start Date End Date Taking? Authorizing Provider  ALPRAZolam (XANAX PO) Take 0.25 mg by mouth. Reported on 12/09/2015    Historical Provider, MD  calcium carbonate 200 MG capsule Take 250 mg by mouth 2 (two) times daily with a meal.    Historical Provider, MD  cholecalciferol (VITAMIN D) 1000 units tablet Take 2,000 Units by mouth daily.     Historical Provider, MD  omeprazole (PRILOSEC) 20 MG capsule Take 1 capsule (20 mg total) by mouth daily. Patient not taking: Reported on 12/09/2015 07/27/13   Francesca Oman, DO  ondansetron (ZOFRAN ODT) 4 MG disintegrating tablet Take 1 tablet (4 mg total) by mouth every 8 (eight) hours as needed for nausea. Patient not taking: Reported on 12/09/2015 09/13/13   Noemi Chapel, MD   BP 103/71 mmHg  Pulse 105  Temp(Src) 98.1 F (36.7 C) (Oral)  Resp 20  Ht 5\' 3"  (1.6 m)  Wt  39.179 kg  BMI 15.30 kg/m2  SpO2 98%  LMP 01/02/2016 Physical Exam  Constitutional: She is oriented to person, place, and time. She appears well-developed and well-nourished.  Alert and in no acute distress  HENT:  Head: Normocephalic and atraumatic.  Cardiovascular: Normal rate, regular rhythm, normal heart sounds and intact distal pulses.  Exam reveals no gallop and no friction rub.   No murmur heard. Pulmonary/Chest: Effort normal and breath sounds normal. No respiratory distress. She has no wheezes. She has no rales. She exhibits no tenderness.   Abdominal: Soft. Bowel sounds are normal. She exhibits distension. There is tenderness (RLQ).  Negative psoas, negative obturator.   Musculoskeletal: She exhibits no edema.  Neurological: She is alert and oriented to person, place, and time.  Skin: Skin is warm and dry.  Nursing note and vitals reviewed.   ED Course  Procedures (including critical care time) Labs Review Labs Reviewed  BASIC METABOLIC PANEL - Abnormal; Notable for the following:    Sodium 134 (*)    Chloride 99 (*)    Glucose, Bld 102 (*)    All other components within normal limits  URINALYSIS, ROUTINE W REFLEX MICROSCOPIC (NOT AT [facility]) - Abnormal; Notable for the following:    Color, Urine AMBER (*)    APPearance CLOUDY (*)    Specific Gravity, Urine 1.041 (*)    Hgb urine dipstick SMALL (*)    Bilirubin Urine SMALL (*)    Ketones, ur 40 (*)    All other components within normal limits  URINE MICROSCOPIC-ADD ON - Abnormal; Notable for the following:    Squamous Epithelial / LPF 6-30 (*)    Bacteria, UA MANY (*)    All other components within normal limits  HEPATIC FUNCTION PANEL - Abnormal; Notable for the following:    AST 14 (*)    ALT 13 (*)    Bilirubin, Direct <0.1 (*)    All other components within normal limits  CBC WITH DIFFERENTIAL/PLATELET  LIPASE, BLOOD  PREGNANCY, URINE    Imaging Review Ct Abdomen Pelvis W Contrast  01/15/2016  CLINICAL DATA:  Acute onset of right lower quadrant and mid abdominal pain and constipation. Initial encounter. EXAM: CT ABDOMEN AND PELVIS WITH CONTRAST TECHNIQUE: Multidetector CT imaging of the abdomen and pelvis was performed using the standard protocol following bolus administration of intravenous contrast. CONTRAST:  22mL ISOVUE-300 IOPAMIDOL (ISOVUE-300) INJECTION 61% COMPARISON:  CT of the abdomen performed 06/26/2015 FINDINGS: Minimal left basilar atelectasis is noted. There is dilatation of the common bile duct to 9 mm, with prominence of the intrahepatic  biliary ducts, increased from the prior study. This raises concern for distal obstruction. The gallbladder is unremarkable in appearance. A 5.3 cm lesion at the left hepatic lobe is grossly stable, with central scar, compatible with focal nodular hyperplasia. A 2.3 x 1.8 cm lesion at the caudate lobe is also relatively stable, reflecting a hemangioma. The spleen is unremarkable in appearance. The pancreas is within normal limits. The adrenal glands are unremarkable. The kidneys are unremarkable in appearance. There is no evidence of hydronephrosis. No renal or ureteral stones are seen. No perinephric stranding is appreciated. No significant free fluid is seen. There is fecalization of the distal ileum, likely reflecting small bowel dysmotility. Vague surrounding soft tissue inflammation is noted, possibly reflecting a mild infectious or inflammatory process. The small bowel is otherwise grossly unremarkable. The stomach is within normal limits. No acute vascular abnormalities are seen. The appendix is normal in caliber and  contains air, without evidence of appendicitis. The colon is grossly unremarkable in appearance. The bladder is mildly distended and grossly unremarkable. The uterus is grossly unremarkable in appearance. Bilateral Essure coils are noted. The ovaries are relatively symmetric. No suspicious adnexal masses are seen. No inguinal lymphadenopathy is seen. No acute osseous abnormalities are identified. IMPRESSION: 1. Fecalization of the distal ileum reflects small bowel dysmotility. Vague surrounding soft tissue inflammation may reflect a mild infectious or inflammatory process. 2. Dilatation of the common bile duct to 9 mm, with prominence of the intrahepatic biliary ducts, increased from the prior study. This raises concern for distal obstruction. Gallbladder unremarkable in appearance. MRCP or ERCP could be considered for further evaluation. 3. Focal nodular hyperplasia and apparent hemangioma at the  liver are stable in appearance. Electronically Signed   By: Garald Balding M.D.   On: 01/15/2016 18:52   I have personally reviewed and evaluated these images and lab results as part of my medical decision-making.   EKG Interpretation None      MDM   Final diagnoses:  Right sided abdominal pain   MINETTE VASSEUR was sent to ED from [place] PCP for RLQ abdominal pain to rule out appendicitis. On exam, patient is afebrile. Distended abdomen and TTP of RLQ.   Labs: CBC and lipase wdl, BMP reassuring. Upreg negative. UA with signs of dehydration - 1L fluids given.   Imaging: CT abd/pelvis with no appy, but does show dilation of the common bile duct and fecalization of distal ileum -  Dr. Cristina Gong, [facility], was consulted in regards to patient/imaging who recommends outpatient follow up and symptomatic therapy. One small dose of alprazolam here to aid in pain the recommend heat for visceral pain relief and treat constipation.   Patient already has rx from PCP for lactulose that was given today - will recommend this for constipation treatment.   Patient discussed with Dr. Stark Jock who agrees with treatment plan.   [facility], PA-C 01/15/16 2131  Veryl Speak, MD 01/15/16 2325

## 2016-01-15 NOTE — ED Notes (Signed)
Pt transported to CT at this time via stretcher

## 2016-01-15 NOTE — ED Notes (Signed)
Pt reports last normal BM x 1 week ago. Pt states she had a small amount of hard stool yesterday. Pt has notable abd distention which pt states is slightly better today. Pt reports taking a laxative yesterday without any results.

## 2016-01-20 ENCOUNTER — Encounter: Payer: Self-pay | Admitting: Family Medicine

## 2016-01-20 ENCOUNTER — Ambulatory Visit (INDEPENDENT_AMBULATORY_CARE_PROVIDER_SITE_OTHER): Payer: 59 | Admitting: Family Medicine

## 2016-01-20 VITALS — Ht 63.0 in | Wt 87.4 lb

## 2016-01-20 DIAGNOSIS — R634 Abnormal weight loss: Secondary | ICD-10-CM

## 2016-01-20 NOTE — Patient Instructions (Addendum)
Recommendations: - Be sure to get at least 56 oz of fluids a day, getting at least 4 cups (32 oz) by noon!  - After you have done this for the next 4 weeks, feel free to cut back to a minimum of 48 oz per day.    - Start your day with at least 8 oz of water.    - Get in the habit of always having a water bottle with you.   - Non-water drinks:  Gatorade, cranberry juice (mixed with seltzer?), or any other juice - Include fruits and veg's daily:  - Veg's and/or fruit with each meal.  - Aim for getting some form of beans at least 3 X wk.   - Continue to: - Add plenty of high-quality fats, including olive oil, hummus, avocado, nuts, seeds, nut butters.    - Also:  Cheese, fatty fish, other dairy products, eggs.   - Plan ahead for meals and snacks, including what foods to keep on hand.   - Keep food records, and review previous days to ask yourself if you are meeting your goals.    - Your weight today is probably not as low as you'd feared, but as you know, there is room for gaining!  A good initial goal weight is 100 lb, and then let's evaluate from there.

## 2016-01-20 NOTE — Progress Notes (Signed)
Medical Nutrition Therapy:  Appt start time: 1000 end time:  1100. PCP   [name], MD Counselor  Lilyan Punt, [facility], [place]  Assessment:   Stacy [name] was in the ER June 1, with acute abd pain.  Diagnosis was constipation and dehydration. Stacy [name] had been to [employer] prior to this episode of constipation, when she had been drinking less water and eating fewer vegetables (fiber).  She thinks she had probably gained at least a pound of so prior to this, but yesterday was the first day she ate normally since then.    Stacy [name] estimates that usual fluid intake is ~32 oz/day.  She has been keeping a daily food record, and said that reviewing most days has helped remind her of getting snacks as appropriate.    24-hr recall suggests intake of ~1600 kcal:  (Up at 6:30 AM) B (7:30 AM)-  2 T hummus, 6 small turk sausage, 1 t olive oil, 1/2 large pita, water 360  Snk (9 AM)-  1 strawberry streudel        200 L (12 PM)-  6 oz salmon, 3/4 c grn beans, 3/4 c stuffing, water     Snk (4 PM)-  Nutty Buddy icecrm, water       200 D (6 PM)-  1 c grn beans, 1 c rice, 1/2 c stew meat, tossed salad, olive oil, water 550+ Snk (7 PM)-  1 small pc cake; 1/2 c grapes, water, 4 oz strawberry Go-gurt  270+ Typical day? Yes.  typical of intake prior to ER visit on June 1.    Progress Towards Goal(s):  In progress.   Nutritional Diagnosis:  No progress on [place]-3.1 Underweight As related to poor appetite.  As evidenced by varying degrees of success in increasing intake.    Intervention:  Nutrition education.  Handouts given during visit include:  AVS  Demonstrated degree of understanding via:  Teach Back   Monitoring/Evaluation:  Dietary intake, exercise, and body weight in 5 week(s).

## 2016-02-24 ENCOUNTER — Encounter: Payer: Self-pay | Admitting: Family Medicine

## 2016-02-24 ENCOUNTER — Ambulatory Visit (INDEPENDENT_AMBULATORY_CARE_PROVIDER_SITE_OTHER): Payer: 59 | Admitting: Family Medicine

## 2016-02-24 VITALS — Ht 63.0 in | Wt 89.2 lb

## 2016-02-24 DIAGNOSIS — R634 Abnormal weight loss: Secondary | ICD-10-CM

## 2016-02-24 NOTE — Patient Instructions (Signed)
-   Continue to schedule eating times so you have time to get hungry between meals, starting with a good breakfast.    - Add to your current breakfast as you are able, e.g., hummus and/or yogurt.   - For at least one lunch or dinner each day, include a beverage that provides calories, soda, juice, chocolate milk (try Lactaid milk or Fairlife). - Continue to review your food record at least every couple of days, and check to see if you are meeting your food goals of 3 meals, 2 snacks, and adequate food and fluids:  How can you improve on this intake for the next day?

## 2016-02-24 NOTE — Progress Notes (Signed)
Medical Nutrition Therapy:  Appt start time: 1000 end time:  1100. PCP   [name], MD Counselor  Lilyan Punt, [facility], [place]  Assessment:  Primary concerns today: Unintentional Weight Loss (R63.4).  Stacy [name] is feeling great.  She is no longer seeing therapist Lilyan Punt, feeling she's made great progress in managing anxiety.  Currently eating 3 meals/day and 2 snacks/day.  She is regularly including nuts, Kind Bars, and olive oil.  She estimates she is getting 36 oz water before noon, and more in the afternoon.  Sometimes has difficulty eating enough if not enough hours between eating.    Shyvonne identifies her biggest challenge as getting all her F & V in each day along with adequate overall food and water.    Still not doing any regular physical activity other than ADLs.    24-hr recall:  (Up at 8:30 AM; water at 8:45) B (9 AM)-  1 c coffee, 2 T creamer, 2 tsp sugar, 4 slc turk bacon, 1/2 large pita bread, 1 T olive oil Snk (10 AM)-  1 frt bar L (130 PM)-  [place] food: 1 c chx w/ broc&carrots, 1 c fried rice, water Snk (3 PM)-  1/2 c ice crm D (5:30 PM)-  1 c rice w/ ground beef & yogurt, chx leg w/ skin, salad, 1 tsp olive oil, water Snk ( PM)-  1 c watermelon Typical day? Yes.    Progress Towards Goal(s):  In progress.   Nutritional Diagnosis:  No progress on [place]-3.1 Underweight As related to poor appetite.  As evidenced by varying degrees of success in increasing intake.    Intervention:  Nutrition education.  Handouts given during visit include:  AVS  Demonstrated degree of understanding via:  Teach Back   Monitoring/Evaluation:  Dietary intake, exercise, and body weight in 5 week(s).

## 2016-04-01 ENCOUNTER — Ambulatory Visit (INDEPENDENT_AMBULATORY_CARE_PROVIDER_SITE_OTHER): Payer: 59 | Admitting: Family Medicine

## 2016-04-01 ENCOUNTER — Encounter: Payer: Self-pay | Admitting: Family Medicine

## 2016-04-01 DIAGNOSIS — R634 Abnormal weight loss: Secondary | ICD-10-CM | POA: Diagnosis not present

## 2016-04-01 NOTE — Progress Notes (Signed)
Medical Nutrition Therapy:  Appt start time: 1330 end time:  1430. PCP   [name], MD Counselor  Lilyan Punt, [facility], [place]  Assessment:  Primary concerns today: Unintentional Weight Loss (R63.4).  Stacy [name] continues to do well.  She feels happy, and feels her emotional well-being has helped her stay physically well.  She is no longer seeing Lilyan Punt, who told her that she can call for an appt any time, but does not need to continue treatment at this time.    24-hr recall:  (Up at 7:15 AM) B (7:30 AM)-  1 c coffee, 2 t sug, 1 T milk, 5 slc turk bacon, 1/2 c blueberries, olive oil on 1/2 large pita, water Snk (9 AM)-  6 pb crackers, 20 oz Gatorade L (12:30 PM)-  1 c pasta alfredo, 1/2 chx, tossed salad, 2 T drsng, 1 c choc 2% milk Snk (2 PM)-  1 apple, 3 T pnut butter, water D (7 PM)-  [place] Sushi: 2 lobster rolls, ~1 c fish salad, & 5-6 shrimp, 1/2 c rice, 1 glass wine, water Snk ( PM)-  --- Typical day? Yes.   Except that she usually has an evening snack; often popcorn or fruit.   Progress Towards Goal(s):  In progress.   Nutritional Diagnosis:  No progress on [place]-3.1 Underweight As related to poor appetite.  As evidenced by varying degrees of success in increasing intake.    Intervention:  Nutrition education.  Handouts given during visit include:  AVS  Demonstrated degree of understanding via:  Teach Back   Monitoring/Evaluation:  Weight check in 7 week(s).

## 2016-04-01 NOTE — Patient Instructions (Addendum)
Keep up the great work!  Let's plan a weight check on October 3 at 10 AM. If weight continues to go up - however slowly - we'll just plan on monthly weight checks.  If weight is not up, or is down, we'll schedule a full appt.    As your schedule gets busy with the start of school, remember that planning ahead is crucial.    Red flags:  - Feeling stressed.    - Not sleeping well.    - Feeling too busy for eating  - Thoughts that it might be good to talk with Pamala Hurry again  - Feeling exhausted/too tired  - Irritability (Consider what's going on with your sleep and with eating!)

## 2016-05-18 ENCOUNTER — Encounter: Payer: Self-pay | Admitting: Family Medicine

## 2016-05-18 NOTE — Progress Notes (Signed)
Stacy [name] was here for a weight check today.  She was accompanied by her husband Stacy [name] today.  Stacy [name] was very surprised that her weight was down 4 lb since mid-Aug b/c she feels she has been consistently eating a lot.    I suggested that she start recording intake on Stacy [name], and she agreed to do so.  I will send her an invitation to link to my account.    24-hr recall:  (Up at 6 AM) B (7 AM)-  1-2 oz Stacy [name] sausage, 2 oz provolone, tom, blk olives, water Snk (9:30)-  6 pb crackers, water L (1 PM)-  4 chx nuggets, 1 c rice, <1/2 c ground beef, 6 oz plain yogurt, water Snk (2:30)-  1 c ice crm D (5:30 PM)-  3 slc bread, 3 tbsp grnd beef, 9 tbsp tahini, few pine nuts, water Snk (7 PM)-  1 handful carrots Typical day? No. Breakfast usually includes bread or Crm of Wht.

## 2016-05-18 NOTE — Patient Instructions (Signed)
-   Accept the email invitation to [email or web address] and start logging food intake there.    - Keep on hand some mixed nuts and seeds (along with dried fruit), and use liberally for snacks (or mixed in yogurt or cereal).    - Weight check Nov 7th at 11 AM.

## 2016-06-07 ENCOUNTER — Encounter: Payer: Self-pay | Admitting: Family Medicine

## 2016-06-07 NOTE — Progress Notes (Signed)
Stacy [name] has been working hard on keeping her food intake up.  Daily intake recorded in [email or web address] suggests that she is doing a pretty good job in getting adequate kcal, which is also reflected in today's weight of 91.6 lb, up >3 lb since 3 wks ago.  I reminded her today to aim for consistency, i.e., there has been a fairly wide range of meal sizes sometimes.    She will come back for a wt check in 4 wks.

## 2016-06-08 LAB — BASIC METABOLIC PANEL
BUN: 11 (ref 4–21)
CREATININE: 0.4 — AB (ref 0.5–1.1)
GLUCOSE: 104
POTASSIUM: 3.6 (ref 3.4–5.3)
Sodium: 140 (ref 137–147)

## 2016-06-08 LAB — LIPID PANEL
Cholesterol: 130 (ref 0–200)
HDL: 38 (ref 35–70)
LDL Cholesterol: 75
Triglycerides: 86 (ref 40–160)

## 2016-06-08 LAB — HEPATIC FUNCTION PANEL
ALK PHOS: 58 (ref 25–125)
ALT: 9 (ref 7–35)
AST: 7 — AB (ref 13–35)
Bilirubin, Total: 0.6

## 2016-07-05 ENCOUNTER — Encounter: Payer: Self-pay | Admitting: Family Medicine

## 2016-07-05 NOTE — Progress Notes (Signed)
Stacy [name] was in for a weight check again today, which revealed her weight is exactly the same as a month ago: 91.6 lb (BMI 16.23).  She has continued to record intake on [email or web address] which suggests an estimated intake of 1800-2300 kcal/day.    I told [name] she needs at least 2400 kcal/day to gain weight, but I was not encouraging her to count kcal.  Below are the recommendations I gave her today: Dietary Recommendations, 07-05-16 1. Minimal portion sizes: Rice:  At least 1 full cup Hummus:   cup Cereal: 2 cups Nuts: 2-4 tablespoons One-dish meals: 2 cups 2. Pay attn. to which foods are best tolerated or poorly tolerated; get more of the former, fewer of the latter.  For example, non-carbonated beverages will be better.   3. Space your meals and snacks out as evenly as you can.   4. Over the holidays, stick to the normal eating and sleeping routine as much as possible.   5. Even when you feel full, make the choice to have one more serving!

## 2016-07-28 ENCOUNTER — Other Ambulatory Visit: Payer: Self-pay | Admitting: Gastroenterology

## 2016-07-28 DIAGNOSIS — K769 Liver disease, unspecified: Secondary | ICD-10-CM

## 2016-08-19 ENCOUNTER — Ambulatory Visit
Admission: RE | Admit: 2016-08-19 | Discharge: 2016-08-19 | Disposition: A | Discharge status: Home / Self Care | Payer: 59 | Source: Ambulatory Visit | Attending: Gastroenterology | Admitting: Gastroenterology

## 2016-08-19 DIAGNOSIS — K769 Liver disease, unspecified: Secondary | ICD-10-CM

## 2016-08-19 DIAGNOSIS — K7689 Other specified diseases of liver: Secondary | ICD-10-CM | POA: Diagnosis not present

## 2016-08-20 ENCOUNTER — Other Ambulatory Visit: Payer: Self-pay | Admitting: Gastroenterology

## 2016-08-20 DIAGNOSIS — K769 Liver disease, unspecified: Secondary | ICD-10-CM

## 2016-08-31 ENCOUNTER — Ambulatory Visit: Payer: 59 | Admitting: Family Medicine

## 2016-09-03 ENCOUNTER — Ambulatory Visit
Admission: RE | Admit: 2016-09-03 | Discharge: 2016-09-03 | Disposition: A | Discharge status: Home / Self Care | Payer: 59 | Source: Ambulatory Visit | Attending: Gastroenterology | Admitting: Gastroenterology

## 2016-09-03 DIAGNOSIS — K769 Liver disease, unspecified: Secondary | ICD-10-CM

## 2016-09-03 MED ORDER — GADOXETATE DISODIUM 0.25 MMOL/ML IV SOLN
5.0000 mL | Freq: Once | INTRAVENOUS | Status: AC | PRN
  Administered 2016-09-03: 5 mL via INTRAVENOUS

## 2016-09-07 ENCOUNTER — Ambulatory Visit (INDEPENDENT_AMBULATORY_CARE_PROVIDER_SITE_OTHER): Payer: 59 | Admitting: Family Medicine

## 2016-09-07 ENCOUNTER — Encounter: Payer: Self-pay | Admitting: Family Medicine

## 2016-09-07 DIAGNOSIS — R634 Abnormal weight loss: Secondary | ICD-10-CM | POA: Diagnosis not present

## 2016-09-07 NOTE — Patient Instructions (Addendum)
-   Supplements:    - Increase calcium to 500 mg per day.    - When you see Dr. Radene Ou next, ask to get a vitamin D test.  GET THE ACTUAL NUMBER, NOT JUST "NORMAL RANGE."    - Continue to space out meals and snacks as you have been doing (great job!), and:  - When you feel satisfied at the end of a meal, have one more portion of something.    - A good (minimum) goal weight for you is 105 lb.   - Continue to record intake on [email or web address]  - Check today's weight on your home scale, and record this.    - Check weight 1-2 X month, and send message thru [email or web address]  (Next message by Feb 13)  - Goal is 2-lb increase per month (or at least per 6 weeks).    Weights, 2015-2018:  March 2015  105 Aug 2015 103 Nov 2015  105 Feb 2016 102 Feb 2017   88 Jun 2017   86 Jul 2017   89 Aug 2017   92 Oct 2017   88 Oct 2017   91 Nov 2017   91 Jan 2018   93.4  - Child nutrition: See [email or web address] for some excellent resources.  I especially recommend her book, "How to get your child to eat, but not too much." - Sinus problems: Consider using a Neti pot.

## 2016-09-07 NOTE — Progress Notes (Signed)
Medical Nutrition Therapy:  Appt start time: 1000 end time:  1100. PCP   [name], MD Counselor  Lilyan Punt, [facility], [place] (not currently seeing)  Assessment:  Primary concerns today: Unintentional Weight Loss (R63.4).  Luci continues to do well.  She has usually been eating beyond fullness, and food records as show on [email or web address] indicate that she is making good food choices, and is eating 3 full meals and 3 snacks on most days.  She feels comfortable with her food choices as well as how her life is going recently.    Paisyn identified her biggest food challenges as being making sure she gets enough at each meal, e.g., getting more than ~2 cups of food per meal.  She wants to continue recording intake on [email or web address] b/c she said it helps her in terms of accountability.    We discussed an ultimate weight goal today of at least 105 lb.  Classie is open to the goal weight of 110-115 lb as well.    24-hr recall:  (Up at 7 AM) B (7:45 AM)-  1-oz [place] sausage burrito, 1/2 c mozzarella, olives, pepper, water Snk (10 AM)-  2 cc cookies L (1:15 PM)-  8" [place] sub w/ chs, oil, Klondike bar, 12 oz Sprite, water Snk (2:30)-  1/2 c potato chips, water D (5:20 PM)-  2 c peas with rice & stew meat, tom sauce Snk (7 PM)-  6 crackers, water Typical day? Yes.    Progress Towards Goal(s):  In progress.   Nutritional Diagnosis:  Good progress noted on [place]-3.1 Underweight As related to poor appetite.  As evidenced by increased food intake and weight.    Intervention:  Nutrition education.  Handouts given during visit include:  AVS  Demonstrated degree of understanding via:  Teach Back   Monitoring/Evaluation:  Weight check prn.  Jameisha will check weight at home 1-2 X month, and let me know numbers at least monthly.

## 2016-09-21 DIAGNOSIS — Z01419 Encounter for gynecological examination (general) (routine) without abnormal findings: Secondary | ICD-10-CM | POA: Diagnosis not present

## 2016-10-05 DIAGNOSIS — N83209 Unspecified ovarian cyst, unspecified side: Secondary | ICD-10-CM | POA: Insufficient documentation

## 2016-10-05 DIAGNOSIS — N920 Excessive and frequent menstruation with regular cycle: Secondary | ICD-10-CM | POA: Diagnosis not present

## 2016-10-24 ENCOUNTER — Emergency Department (HOSPITAL_COMMUNITY)
Admission: EM | Admit: 2016-10-24 | Discharge: 2016-10-24 | Disposition: A | Discharge status: Home / Self Care | Payer: 59 | Attending: Emergency Medicine | Admitting: Emergency Medicine

## 2016-10-24 DIAGNOSIS — R197 Diarrhea, unspecified: Secondary | ICD-10-CM | POA: Diagnosis not present

## 2016-10-24 DIAGNOSIS — R112 Nausea with vomiting, unspecified: Secondary | ICD-10-CM | POA: Diagnosis not present

## 2016-10-24 LAB — CBC WITH DIFFERENTIAL/PLATELET
BASOS PCT: 0 %
Basophils Absolute: 0 10*3/uL (ref 0.0–0.1)
EOS ABS: 0 10*3/uL (ref 0.0–0.7)
Eosinophils Relative: 0 %
HEMATOCRIT: 40.7 % (ref 36.0–46.0)
HEMOGLOBIN: 13.6 g/dL (ref 12.0–15.0)
LYMPHS ABS: 1.4 10*3/uL (ref 0.7–4.0)
Lymphocytes Relative: 11 %
MCH: 28.5 pg (ref 26.0–34.0)
MCHC: 33.4 g/dL (ref 30.0–36.0)
MCV: 85.3 fL (ref 78.0–100.0)
MONOS PCT: 10 %
Monocytes Absolute: 1.3 10*3/uL — ABNORMAL HIGH (ref 0.1–1.0)
NEUTROS ABS: 10.1 10*3/uL — AB (ref 1.7–7.7)
NEUTROS PCT: 79 %
Platelets: 406 10*3/uL — ABNORMAL HIGH (ref 150–400)
RBC: 4.77 MIL/uL (ref 3.87–5.11)
RDW: 12.7 % (ref 11.5–15.5)
WBC: 12.8 10*3/uL — ABNORMAL HIGH (ref 4.0–10.5)

## 2016-10-24 LAB — COMPREHENSIVE METABOLIC PANEL
ALT: 10 U/L — ABNORMAL LOW (ref 14–54)
ANION GAP: 14 (ref 5–15)
AST: 14 U/L — ABNORMAL LOW (ref 15–41)
Albumin: 4.5 g/dL (ref 3.5–5.0)
Alkaline Phosphatase: 69 U/L (ref 38–126)
BUN: 28 mg/dL — ABNORMAL HIGH (ref 6–20)
CALCIUM: 9.8 mg/dL (ref 8.9–10.3)
CHLORIDE: 93 mmol/L — AB (ref 101–111)
CO2: 27 mmol/L (ref 22–32)
CREATININE: 1.49 mg/dL — AB (ref 0.44–1.00)
GFR, EST AFRICAN AMERICAN: 51 mL/min — AB (ref >60)
GFR, EST NON AFRICAN AMERICAN: 44 mL/min — AB (ref >60)
Glucose, Bld: 125 mg/dL — ABNORMAL HIGH (ref 65–99)
Potassium: 3.9 mmol/L (ref 3.5–5.1)
SODIUM: 134 mmol/L — AB (ref 135–145)
Total Bilirubin: 1.4 mg/dL — ABNORMAL HIGH (ref 0.3–1.2)
Total Protein: 8.2 g/dL — ABNORMAL HIGH (ref 6.5–8.1)

## 2016-10-24 LAB — URINALYSIS, MICROSCOPIC (REFLEX)

## 2016-10-24 LAB — URINALYSIS, ROUTINE W REFLEX MICROSCOPIC
Glucose, UA: NEGATIVE mg/dL
Ketones, ur: 15 mg/dL — AB
NITRITE: NEGATIVE
PROTEIN: 100 mg/dL — AB
SPECIFIC GRAVITY, URINE: 1.025 (ref 1.005–1.030)
pH: 6 (ref 5.0–8.0)

## 2016-10-24 LAB — MAGNESIUM: MAGNESIUM: 2.7 mg/dL — AB (ref 1.7–2.4)

## 2016-10-24 LAB — POC URINE PREG, ED: PREG TEST UR: NEGATIVE

## 2016-10-24 LAB — LIPASE, BLOOD: LIPASE: 19 U/L (ref 11–51)

## 2016-10-24 MED ORDER — DEXTROSE-NACL 5-0.45 % IV SOLN
INTRAVENOUS | Status: DC
  Administered 2016-10-24: 12:00:00 via INTRAVENOUS

## 2016-10-24 MED ORDER — ONDANSETRON HCL 4 MG/2ML IJ SOLN
4.0000 mg | Freq: Once | INTRAMUSCULAR | Status: AC
  Administered 2016-10-24: 4 mg via INTRAVENOUS
  Filled 2016-10-24: qty 2

## 2016-10-24 MED ORDER — SODIUM CHLORIDE 0.9 % IV BOLUS (SEPSIS)
1000.0000 mL | Freq: Once | INTRAVENOUS | Status: AC
  Administered 2016-10-24: 1000 mL via INTRAVENOUS

## 2016-10-24 NOTE — ED Provider Notes (Signed)
[facility] DEPT Provider Note   CSN: [id number] Arrival date & time: 10/24/16  0109     History   Chief Complaint No chief complaint on file.   HPI Stacy [name] is a 36 y.o. female.  The history is provided by the patient and medical records. No language interpreter was used.   Stacy [name] is a 36 y.o. female  who presents to the Emergency Department complaining of nausea associated with fatigue and multiple episodes of emesis since last night. She tried her home Zofran and Motrin with little improvement. Patient states that every time she has something to eat or drink, symptoms worsen. No abdominal pain, diarrhea, constipation, fevers, chest pain, shortness of breath, dysuria, urinary urgency/frequency, vaginal discharge or other complaints. No known sick contacts. No recent travel or new foods.  Past Medical History:  Diagnosis Date  . Benign liver cyst   . GERD (gastroesophageal reflux disease)   . No diagnosis     Patient Active Problem List   Diagnosis Date Noted  . Dehydration 07/27/2013  . Intractable nausea and vomiting 07/27/2013  . Hypotension, unspecified 07/27/2013  . Hyperglycemia 07/27/2013  . Hepatic adenoma 07/27/2013  . ACUTE PHARYNGITIS 06/06/2008    Past Surgical History:  Procedure Laterality Date  . CESAREAN SECTION      OB History    Gravida Para Term Preterm AB Living   2 2 1     2    SAB TAB Ectopic Multiple Live Births           1       Home Medications    Prior to Admission medications   Medication Sig Start Date End Date Taking? Authorizing Provider  ALPRAZolam (XANAX PO) Take 0.25 mg by mouth. Reported on 12/09/2015    Historical Provider, MD  calcium carbonate 200 MG capsule Take 250 mg by mouth 2 (two) times daily with a meal.    Historical Provider, MD  cholecalciferol (VITAMIN D) 1000 units tablet Take 2,000 Units by mouth daily.     Historical Provider, MD  omeprazole (PRILOSEC) 20 MG capsule Take 1 capsule (20 mg  total) by mouth daily. 07/27/13   Francesca Oman, DO  ondansetron (ZOFRAN ODT) 4 MG disintegrating tablet Take 1 tablet (4 mg total) by mouth every 8 (eight) hours as needed for nausea. Patient not taking: Reported on 09/07/2016 09/13/13   Noemi Chapel, MD    Family History Family History  Problem Relation Age of Onset  . CAD Father   . Hypertension Father   . Diabetes Father     Social History Social History  Substance Use Topics  . Smoking status: Never Smoker  . Smokeless tobacco: Never Used  . Alcohol use No     Allergies   Erythromycin; Tetracycline; Other; and Sulfonamide derivatives   Review of Systems Review of Systems  Constitutional: Negative for chills and fever.  HENT: Negative for congestion.   Eyes: Negative for visual disturbance.  Respiratory: Negative for cough and shortness of breath.   Cardiovascular: Negative for chest pain.  Gastrointestinal: Positive for nausea and vomiting. Negative for abdominal pain, blood in stool, constipation and diarrhea.  Genitourinary: Negative for dysuria, frequency, urgency and vaginal discharge.  Musculoskeletal: Negative for back pain and neck pain.  Skin: Negative for rash.  Neurological: Negative for headaches.     Physical Exam Updated Vital Signs BP 90/60   Pulse 92   Temp 98 F (36.7 C) (Oral)   SpO2 99%  Physical Exam  Constitutional: She is oriented to person, place, and time. She appears well-developed and well-nourished. No distress.  HENT:  Head: Normocephalic and atraumatic.  Cardiovascular: Normal rate, regular rhythm and normal heart sounds.   No murmur heard. Pulmonary/Chest: Effort normal and breath sounds normal. No respiratory distress.  Abdominal: Soft. Bowel sounds are normal. She exhibits no distension. There is no tenderness.  No abdominal or CVA tenderness.   Musculoskeletal: Normal range of motion.  Neurological: She is alert and oriented to person, place, and time.  Skin: Skin is warm  and dry.  Nursing note and vitals reviewed.    ED Treatments / Results  Labs (all labs ordered are listed, but only abnormal results are displayed) Labs Reviewed  COMPREHENSIVE METABOLIC PANEL - Abnormal; Notable for the following:       Result Value   Sodium 134 (*)    Chloride 93 (*)    Glucose, Bld 125 (*)    BUN 28 (*)    Creatinine, Ser 1.49 (*)    Total Protein 8.2 (*)    AST 14 (*)    ALT 10 (*)    Total Bilirubin 1.4 (*)    GFR calc non Af Amer 44 (*)    GFR calc Af Amer 51 (*)    All other components within normal limits  CBC WITH DIFFERENTIAL/PLATELET - Abnormal; Notable for the following:    WBC 12.8 (*)    Platelets 406 (*)    Neutro Abs 10.1 (*)    Monocytes Absolute 1.3 (*)    All other components within normal limits  URINALYSIS, ROUTINE W REFLEX MICROSCOPIC - Abnormal; Notable for the following:    APPearance CLOUDY (*)    Hgb urine dipstick MODERATE (*)    Bilirubin Urine SMALL (*)    Ketones, ur 15 (*)    Protein, ur 100 (*)    Leukocytes, UA SMALL (*)    All other components within normal limits  MAGNESIUM - Abnormal; Notable for the following:    Magnesium 2.7 (*)    All other components within normal limits  URINALYSIS, MICROSCOPIC (REFLEX) - Abnormal; Notable for the following:    Bacteria, UA MANY (*)    Squamous Epithelial / LPF 6-30 (*)    All other components within normal limits  LIPASE, BLOOD  POC URINE PREG, ED    EKG  EKG Interpretation None       Radiology No results found.  Procedures Procedures (including critical care time)  Medications Ordered in ED Medications  dextrose 5 %-0.45 % sodium chloride infusion ( Intravenous New Bag/Given 10/24/16 1210)  sodium chloride 0.9 % bolus 1,000 mL (0 mLs Intravenous Stopped 10/24/16 1210)  ondansetron (ZOFRAN) injection 4 mg (4 mg Intravenous Given 10/24/16 1040)     Initial Impression / Assessment and Plan / ED Course  I have reviewed the triage vital signs and the nursing  notes.  Pertinent labs & imaging results that were available during my care of the patient were reviewed by me and considered in my medical decision making (see chart for details).    Stacy [name] is a 36 y.o. female who presents to ED for nausea and vomiting since last night. On exam, patient is afebrile with benign abdominal exam. Hypotensive upon arrival, 94/71. Per chart review, patient with hx of hypotension and BP typically runs in this range.   Vitals Conversion 01/15/2016 01/15/2016 01/15/2016  BP 103/71 101/71 107/78   Vitals Conversion 09/13/2013 09/13/2013 07/28/2013  07/28/2013 07/28/2013  BP 98/61 96/62 97/66  100/68 106/73   Vitals Conversion 07/28/2013 07/28/2013  BP 95/65 97/63    Will obtain abdominal labs + mag, give fluid bolus and nausea meds.   Labs reviewed with signs of dehydration including acute kidney injury with creatinine of 1.49, urine with small bili, 15 ketones, 100 protein. Urine does not appear infected. Patient reevaluated and feels much improved after Zofran.  Case discussed with attending, Dr. Ralene Bathe, will continue to hydrate, treat symptoms and ensure patient to tolerate PO. Will continue to observe in ED.   Patient tolerating PO while in ED with no episodes of emesis. She did have large amount of non-bloody diarrhea. Patient re-evaluated and states she feels better after BM's. Dizziness resolved. Benign abdominal exam. No complaints at this time. Feels comfortable with discharge to home. She has enough zofran at home. Understands to call GI in the morning to schedule a follow up appointment. Reasons to return to ER stressed. All questions answered.    Final Clinical Impressions(s) / ED Diagnoses   Final diagnoses:  None    New Prescriptions New Prescriptions   No medications on file     [facility], PA-C 10/24/16 1551    Quintella Reichert, MD 10/30/16 [phone]

## 2016-10-24 NOTE — Discharge Instructions (Signed)
Please call your GI physician tomorrow morning to schedule a follow up appointment.  Zofran as needed for nausea.  Return to ER if you are unable to keep fluids down, blood in the stool, fevers, new or worsening symptoms develop or you have any additional concerns.

## 2016-10-24 NOTE — ED Notes (Signed)
Pt attempted to ambulate to the bathroom and got very dizzy, assisted back to bed and then onto bedside commode, edp notified

## 2016-10-24 NOTE — ED Notes (Signed)
Pt stable, ambulatory, states understanding of discharge instructions 

## 2016-10-24 NOTE — ED Notes (Signed)
Helped with bedside and collected urine

## 2017-02-11 ENCOUNTER — Encounter: Payer: Self-pay | Admitting: Family Medicine

## 2017-02-11 ENCOUNTER — Ambulatory Visit (INDEPENDENT_AMBULATORY_CARE_PROVIDER_SITE_OTHER): Payer: 59 | Admitting: Family Medicine

## 2017-02-11 DIAGNOSIS — R634 Abnormal weight loss: Secondary | ICD-10-CM

## 2017-02-11 NOTE — Patient Instructions (Addendum)
-   Follow food plan as provided today:  13  STARCH     3  VEGETABLES 10  PROTEIN         2  FRUIT   1  MILK/YOGURT 12  FAT   Try using a nutritional drink again.  Experiment with different brands, and even with different types within a brand, such as high-protein or high-calorie.   - Be sure to eat meals early enough so you can get a good amount of food at each meal following.   - Pay attention to your appetite at a subsequent meal:  You want to avoid not only foods that cause GI distress, but also those that keep you full for a long time.  For example, while fat is good in terms of adding calories, it can also make it harder to eat at the next meal b/c it is so slow to digest.   - Email Jeannie if you have Qs re. the Exchange system.    (For your brother:  [email or web address]; [email or web address].)

## 2017-02-11 NOTE — Progress Notes (Signed)
Medical Nutrition Therapy:  Appt start time: 1000 end time:  1100. PCP   [name], MD  Assessment:  Primary concerns today: Unintentional Weight Loss (R63.4).  Stacy [name] has continued to record intake on [email or web address]  She has been weighing herself about once a month at home, but has not sent this information to me.  Weight fluctuates a pound or so, but is down overall about 3 lb since January despite her efforts to eat more.  She agreed that a specific food plan will help her to meet her weight gain goal.    Stacy [name] has used Ensure in the past, but discontinued b/c it caused too much bloating.  Stacy [name] diet is relatively high in fat.  We discussed today the possibility that some high-fat foods she has been eating may actually be contributing to reflux and GI discomfort as well as keeping her full longer.  I asked Stacy [name] to pay attention to this, and to use the food record provided today to document symptoms as well as fullness.    Analysis of 1 week of Stacy [name]'s intake, 6/17-23: Kcal range: 1775 - 2460;  NO Milk, few Fruit, few Veg Starch: 9-15 Protein: 8-12 Fat:      10-13             Grams Kcal    % of kcal CHO 143 572 32% Pro 102 408 23% Fat   92 828 45%   Recommended food plan:  Exchange CHO PRO FAT KCAL Starch 13 195 39 --- 1040 Protein 10 --- 63 27   550 Milk   1   12   8  ---     90 Fruit   2   30 --- ---   120 Veg   3   15   6  ---     75 Fat 12 --- --- 60   540 Totals  252 116 87 2415 Kcal             1008 464 783     (2255) %'s  45% 21% 35%  24-hr recall:  (Up at 7 AM; water with med's.) B (8 AM)-  2 large [place] sausage, 1/2 cup grapes, 1/2 pita bread, 5 Tol[place] oil, water Snk (9 AM)-  Payday bar, 1 c vanilla coffee (110 kcal)  L (11:30 PM)-  2 c rice, veg's, & chicken, water  Snk (3:30)-  1/2 c Cheezits, water Snk (4 PM)-  Pb&J sandwich, water D (5:30 PM)-  1 slc pizza, 1 c str'berries & grapes, 1/2 c sugar snap peas, water (birthday party) Snk ( PM)-  Got sick after  pizza; did vomit, but took zofran after vomiting.  Typical day? No.  More typical is lunch from [place] or leftovers from previous dinner.  Could not eat after spicy pizza.    Progress Towards Goal(s):  In progress.   Nutritional Diagnosis:  Good progress noted on [place]-3.1 Underweight As related to poor appetite.  As evidenced by increased food intake and weight.    Intervention:  Nutrition education.  Handouts given during visit include:  AVS  Food plan  Food record for AN  Demonstrated degree of understanding via:  Teach Back   Monitoring/Evaluation:  Dietary intake and body weight in 6 week(s).

## 2017-03-22 ENCOUNTER — Ambulatory Visit: Payer: 59 | Admitting: Family Medicine

## 2017-03-29 ENCOUNTER — Ambulatory Visit (INDEPENDENT_AMBULATORY_CARE_PROVIDER_SITE_OTHER): Payer: 59 | Admitting: Family Medicine

## 2017-03-29 ENCOUNTER — Encounter: Payer: Self-pay | Admitting: Family Medicine

## 2017-03-29 DIAGNOSIS — R634 Abnormal weight loss: Secondary | ICD-10-CM

## 2017-03-29 NOTE — Progress Notes (Signed)
Medical Nutrition Therapy:  Appt start time: 1000 end time:  1100. PCP   [name], MD  Assessment:  Primary concerns today: Unintentional Weight Loss (R63.4).  Stacy [name] has continued to record intake on [email or web address]  She clearly does not have a good understanding of how to categorize foods using the exchange system, which we reviewed at length today.  She used forms provided at last appt to record intake and to analyze exchanges only for a couple of days, but I emphasized today that using this form will be the best way for her to learn the system, and to stay on track with the intake she needs.    The main barrier to weight gain for Stacy [name] is her poor appetite.    Estimate of yesterday's intake being 500  kcal over target suggests either patient is overestimating portion sizes OR activity energy expenditure and RMR is much higher than estimated.  (Previous analysis of 1 week of records indicated intake range of 1775-2460 kcal.)    Recommended food plan:  Exchange CHO PRO FAT KCAL Starch 13 195 39 --- 1040 Protein 10 --- 63 27   550 Milk   1   12   8  ---     90 Fruit   2   30 --- ---   120 Veg   3   15   6  ---     75 Fat 12 --- --- 60   540 Totals  252 116 87 2415 Kcal             1008 464 783     (2255) %'s  45% 21% 35%  24-hr recall suggests intake of 2700 kcal:  B (9 AM)-  [place] sausage burrito with cheese & tomato & black olives, 2 cup coffee, 2 T creamer, 2 T sugar  Snk (10 AM)-  2 Little Debbie strawberry yogurt mini muffins  L (12:30 PM)-  [place] 1/2 whisky ranch chicken wrap, 1 c fries, 1 c chx tortilla soup, non-alc str'berry daiquiri  Snk (3:15)-  1/2 c mixed nuts D (6 PM)-  2 1/2 c angel hair pasta, 3 oz ground beef, sauce, water Snk (7:30)-  1 large ice crm (sugar) cone  Typical day? Yes.    Analysis of yesterday's exchanges:   Actual  Target   Over/Under Starch  19  13  + 6 + 480 Protein  12  10  + 2 + 110 Milk    0.5    1  -  1 -    45 Fruit    1    2  -  1 -     60 Veg    1    3  -  3 -    75 Fat  14  12  + 2 +   90        500  kcal over target  Progress Towards Goal(s):  In progress.   Nutritional Diagnosis:  No progress noted on [place]-3.1 Underweight As related to poor appetite.  As evidenced by weight loss of ~1 pound.    Intervention:  Nutrition education.  Handouts given during visit include:  AVS  Food plan  Food record for AN  Demonstrated degree of understanding via:  Teach Back   Monitoring/Evaluation:  Dietary intake and body weight in 9 weeks (no appts available sooner).

## 2017-03-29 NOTE — Patient Instructions (Addendum)
-   Record food intake and categorize into exchanges on form provided.   - [employer] message weekly reporting how you did meeting target goals for exchanges.    - Also send inquiries about how to categorize if you're not sure:  Send food and amount eaten, along with any other info that might be useful, e.g., brand or restaurant food is from.  (If you have the label, you can just send serving size, # of grams of carb, fat, and protein.)  - It is fine for you to exceed your target exchange numbers in any category, but the goal is to get AT LEAST up to that target number for each.    - Reminder: To gain weight, you will need to eat beyond your comfort level.  Eat to satisfaction, then have a bit more. Also make sure you have snacks on hand that are easily accessible.    - Eating what you need will get easier for you as your body adapts to consuming more food.

## 2017-05-10 ENCOUNTER — Telehealth: Payer: Self-pay | Admitting: Family Medicine

## 2017-05-10 NOTE — Telephone Encounter (Signed)
°  Relation to JQ:DUKR Call back number:[phone]   Reason for call:  Stacy [name] [phone] patient of yours referring he's wife to establish care, as per patient PCP confirmed, please advise

## 2017-05-10 NOTE — Telephone Encounter (Signed)
Patient scheduled with PCP for 06/27/2017

## 2017-05-10 NOTE — Telephone Encounter (Signed)
Paz approved, okay to schedule at her convenience.

## 2017-05-18 DIAGNOSIS — H00024 Hordeolum internum left upper eyelid: Secondary | ICD-10-CM | POA: Diagnosis not present

## 2017-06-07 ENCOUNTER — Ambulatory Visit (INDEPENDENT_AMBULATORY_CARE_PROVIDER_SITE_OTHER): Payer: 59 | Admitting: Family Medicine

## 2017-06-07 ENCOUNTER — Encounter: Payer: Self-pay | Admitting: Family Medicine

## 2017-06-07 DIAGNOSIS — R634 Abnormal weight loss: Secondary | ICD-10-CM | POA: Diagnosis not present

## 2017-06-07 DIAGNOSIS — Z23 Encounter for immunization: Secondary | ICD-10-CM | POA: Diagnosis not present

## 2017-06-07 NOTE — Progress Notes (Signed)
Medical Nutrition Therapy:  Appt start time: 1000 end time:  1100. PCP   [name], MD  Assessment:  Primary concerns today: Unintentional Weight Loss (R63.4).  Morgaine has continued to record intake on [email or web address] although she has not sent weekly totals or averages of exchanges consumed.  She has also not been using the forms provided on which she can categorize exchanges, thereby gaining a better understanding of how to do this accurately.  Consequently, Charrise's intake does not always match food plan recommended, as she is often overestimating her intake of exchanges.  I emphasized that tallying daily totals of exchanges is the only way she will know if she is on target.  In light of the patient's resistance to this, I instead recommended we simplify the food plan to focus only on those meaningful sources of kcal, protein, starch, and fat exchanges; and to break her food plan into exchanges assigned to meals (see Pt Instrxns).    Selicia has had no nausea or GI distress in recent months; she is more aware and cautious about what foods tend to bother her.    Recommended food plan:  Exchange CHO PRO FAT KCAL Starch 13 195 39 --- 1040 Protein 10 --- 63 27   550 Milk   1   12   8  ---     90 Fruit   2   30 --- ---   120 Veg   3   15   6  ---     75 Fat 12 --- --- 60   540 Totals  252 116 87 2415 Kcal             1008 464 783     (2255) %'s  45% 21% 35%  24-hr recall suggests intake was at least 600 kcal short of recommended energy level:  (Up at 6 AM) B (7:30 AM)-  1 sausage link, 5 T hummus, 1 small cucumber, 1/2 pita, water Snk (11 AM)-  3 cc cookies, hot tea, 1 T sugar L (2 PM)-  1 chsburger on bun, 1/2 c chips, water Snk (3 PM)-  1 slc choc cream cake, water D ( PM)-  2 c rice w/ yogurt sauce, 2 c chx noodle soup, water Snk (9 PM)-  1 c chs puffs, water Typical day? Yes.    Analysis of yesterday's exchanges:   Actual   Target    Over/Under Starch  [id number] 13  --- Protein  [id number]  10  -3 -165 Milk  1      1  --- Fruit        2  -2 -120 Veg  1      3  -2 -  50 Fat  11111   12  -7 -315         650 kcal short  Progress Towards Goal(s):  In progress.   Nutritional Diagnosis:  No progress noted on [place]-3.1 Underweight As related to poor appetite.  As evidenced by no meaningful weight gain.    Intervention:  Nutrition education.  Handouts given during visit include:  AVS with revised food plan, focusing mainly on Protein, Starch, and Fat exchanges.    Demonstrated degree of understanding via:  Teach Back   Monitoring/Evaluation:  Dietary intake and body weight in November.

## 2017-06-07 NOTE — Patient Instructions (Addendum)
13  STARCH                         3  VEGETABLES       10  PROTEIN          2  FRUIT          1  MILK/YOGURT      12  FAT          Food plan by meals: Focus primarily on STARCH, FAT, & PROTEIN.    Starch  Pro Fat B 2  I 2 Snk 2   2 L 2  4 2  Snk 2   2 D 3  5 2  Snk  2   2  Track your intake of each of the above exchanges, making up later in the day for any exchanges you don't get early on.    Continue using RR.com to track intake and to report progress, as well as to ask questions.

## 2017-06-27 ENCOUNTER — Telehealth: Payer: Self-pay

## 2017-06-27 ENCOUNTER — Encounter: Payer: Self-pay | Admitting: Internal Medicine

## 2017-06-27 ENCOUNTER — Ambulatory Visit: Payer: 59 | Admitting: Internal Medicine

## 2017-06-27 ENCOUNTER — Other Ambulatory Visit (INDEPENDENT_AMBULATORY_CARE_PROVIDER_SITE_OTHER): Payer: 59

## 2017-06-27 VITALS — BP 98/62 | HR 106 | Temp 98.0°F | Resp 14 | Ht 63.0 in | Wt 92.1 lb

## 2017-06-27 DIAGNOSIS — D649 Anemia, unspecified: Secondary | ICD-10-CM

## 2017-06-27 DIAGNOSIS — Z114 Encounter for screening for human immunodeficiency virus [HIV]: Secondary | ICD-10-CM | POA: Diagnosis not present

## 2017-06-27 DIAGNOSIS — R636 Underweight: Secondary | ICD-10-CM | POA: Diagnosis not present

## 2017-06-27 DIAGNOSIS — R110 Nausea: Secondary | ICD-10-CM

## 2017-06-27 LAB — COMPREHENSIVE METABOLIC PANEL
ALK PHOS: 54 U/L (ref 39–117)
ALT: 8 U/L (ref 0–35)
AST: 10 U/L (ref 0–37)
Albumin: 3.6 g/dL (ref 3.5–5.2)
BILIRUBIN TOTAL: 0.5 mg/dL (ref 0.2–1.2)
BUN: 16 mg/dL (ref 6–23)
CALCIUM: 9 mg/dL (ref 8.4–10.5)
CO2: 28 mEq/L (ref 19–32)
Chloride: 105 mEq/L (ref 96–112)
Creatinine, Ser: 0.49 mg/dL (ref 0.40–1.20)
GFR: 150.79 mL/min (ref >60.00)
GLUCOSE: 95 mg/dL (ref 70–99)
POTASSIUM: 4.6 meq/L (ref 3.5–5.1)
Sodium: 139 mEq/L (ref 135–145)
TOTAL PROTEIN: 6.2 g/dL (ref 6.0–8.3)

## 2017-06-27 LAB — CBC WITH DIFFERENTIAL/PLATELET
BASOS ABS: 0 10*3/uL (ref 0.0–0.1)
Basophils Relative: 0.4 % (ref 0.0–3.0)
EOS PCT: 0.4 % (ref 0.0–5.0)
Eosinophils Absolute: 0 10*3/uL (ref 0.0–0.7)
HEMATOCRIT: 32.4 % — AB (ref 36.0–46.0)
HEMOGLOBIN: 10.5 g/dL — AB (ref 12.0–15.0)
LYMPHS ABS: 2.1 10*3/uL (ref 0.7–4.0)
LYMPHS PCT: 28 % (ref 12.0–46.0)
MCHC: 32.6 g/dL (ref 30.0–36.0)
MCV: 84.3 fl (ref 78.0–100.0)
MONOS PCT: 8.2 % (ref 3.0–12.0)
Monocytes Absolute: 0.6 10*3/uL (ref 0.1–1.0)
NEUTROS PCT: 63 % (ref 43.0–77.0)
Neutro Abs: 4.7 10*3/uL (ref 1.4–7.7)
Platelets: 402 10*3/uL — ABNORMAL HIGH (ref 150.0–400.0)
RBC: 3.84 Mil/uL — AB (ref 3.87–5.11)
RDW: 14.4 % (ref 11.5–15.5)
WBC: 7.5 10*3/uL (ref 4.0–10.5)

## 2017-06-27 LAB — T3, FREE: T3 FREE: 3.2 pg/mL (ref 2.3–4.2)

## 2017-06-27 LAB — FOLATE: FOLATE: 15.5 ng/mL (ref >5.9)

## 2017-06-27 LAB — TSH: TSH: 0.64 u[IU]/mL (ref 0.35–4.50)

## 2017-06-27 LAB — T4, FREE: FREE T4: 0.86 ng/dL (ref 0.60–1.60)

## 2017-06-27 LAB — VITAMIN B12: Vitamin B-12: 184 pg/mL — ABNORMAL LOW (ref 211–911)

## 2017-06-27 NOTE — Patient Instructions (Signed)
GO TO THE LAB : Get the blood work     GO TO THE FRONT DESK Schedule your next appointment for a follow-up in 2 months  Will get the records from your previous doctors

## 2017-06-27 NOTE — Progress Notes (Signed)
Pre visit review using our clinic review tool, if applicable. No additional management support is needed unless otherwise documented below in the visit note. 

## 2017-06-27 NOTE — Telephone Encounter (Signed)
ROI completed and faxed to Dr. Zadie Rhine at [facility]- [phone]. Form sent for scanning. Awaiting records.

## 2017-06-27 NOTE — Progress Notes (Signed)
Subjective:    Patient ID: Stacy [name], female    DOB: [birth year], 36 y.o.   MRN: [id number]  DOS:  06/27/2017 Type of visit - description : new pt  Interval history: New patient to me, previous PCP Dr. Zadie Rhine at [facility]. Her main concern is her weight. When she finished high school she weighed 120 pounds.  After her second pregnancy in 2013 her weight was 140 pounds. Then, she decided to eat healthy, exercise some more in order to lose weight. Also she was anxious, saw a counselor temporarily. . Since 2013 she started to lose weight steadily, the smallest she has been is 85 pounds. When she is able to gain weight, and  reaches 93 pounds then she started to lose weight again despite trying to eat abundantly. She has seen her PCP and GI doctors, EGD has been done, all labs have been according to the patient essentially normal. Denies any history of a eating disorder such as self induce vomiting.  Also, she still have some nausea, sometimes postprandial but sometimes without eating any food.  No vomiting.  On PPIs and Zofran.  Currently, anxiety is not an issue, last visit with her therapist was about a year ago.  Review of Systems Denies fever chills, no night sweats. She was born in [place], no foreign treatments. No diarrhea, blood in the stools or abdominal pain No depression No headaches After her second baby, she was able to briefly breast-feed. She has a E sure, currently appears are slightly irregular but not absent.   Past Medical History:  Diagnosis Date  . Anxiety   . Benign liver cyst   . GERD (gastroesophageal reflux disease)   . History of UTI     Past Surgical History:  Procedure Laterality Date  . CESAREAN SECTION     twice 2010, 2013  . Esure  ~2015    Social History   Socioeconomic History  . Marital status: Married    Spouse name: Not on file  . Number of children: Not on file  . Years of education: Not on file  . Highest education  level: Not on file  Social Needs  . Financial resource strain: Not on file  . Food insecurity - worry: Not on file  . Food insecurity - inability: Not on file  . Transportation needs - medical: Not on file  . Transportation needs - non-medical: Not on file  Occupational History  . 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 Smoking status: Never Smoker  . Smokeless tobacco: Never Used  Substance and Sexual Activity  . Alcohol use: No    Comment: socially   . Drug use: No  . Sexual activity: Yes    Birth control/protection: None  Other Topics Concern  . Not on file  Social History Narrative   Household: pt, husband , 2 children   2 boys: 2010, 2013   P2G2   Original  from [place], no foreign trips     Family History  Problem Relation Age of Onset  . CAD Father   . Hypertension Father   . Diabetes Father   . Diabetes Brother   . Colon cancer Neg Hx   . Breast cancer Neg Hx      Allergies as of 06/27/2017      Reactions   Tetracyclines & Related Swelling   Swelling in spine; required spinal tap.   Erythromycin Other (See Comments)   unknown  Raspberry Rash   Sulfonamide Derivatives Rash      Medication List        Accurate as of 06/27/17 11:59 PM. Always use your most recent med list.          calcium carbonate 200 MG capsule Take 250 mg by mouth 2 (two) times daily with a meal.   cholecalciferol 1000 units tablet Commonly known as:  VITAMIN D Take 2,000 Units by mouth daily.   omeprazole 20 MG capsule Commonly known as:  PRILOSEC Take 1 capsule (20 mg total) by mouth daily.   ondansetron 4 MG disintegrating tablet Commonly known as:  ZOFRAN ODT Take 1 tablet (4 mg total) by mouth every 8 (eight) hours as needed for nausea.          Objective:   Physical Exam BP 98/62 (BP Location: Left Arm, Patient Position: Sitting, Cuff Size: Small)   Pulse (!) 106   Temp 98 F (36.7 C) (Oral)   Resp 14   Ht 5\' 3"  (1.6 m)   Wt  92 lb 2 oz (41.8 kg)   LMP 05/29/2017 (Exact Date)   SpO2 99%   BMI 16.32 kg/m  General:   Well developed, underweight appearing NAD.  Neck: No  thyromegaly  HEENT:  Normocephalic . Face symmetric, atraumatic Lungs:  CTA B Normal respiratory effort, no intercostal retractions, no accessory muscle use. Heart: RRR,  no murmur.  No pretibial edema bilaterally  Abdomen:  Not distended, soft, non-tender. No rebound or rigidity.   Skin: Exposed areas without rash. Not pale. Not jaundice Neurologic:  alert & oriented X3.  Speech normal, gait appropriate for age and unassisted Strength symmetric and appropriate for age.  Psych: Cognition and judgment appear intact.  Cooperative with normal attention span and concentration.  Behavior appropriate. No anxious or depressed appearing.     Assessment & Plan:   Assessment GERD Benign liver cyst H/o anxiety Gyn: Dr Rogue Bussing @ [facility]: Underweight: Etiology unclear, reportedly all routine labs have been normal.  Her baseline weight was around 120, unable to go beyond 93 pounds now.  All this happened after she delivered her second baby (I wonder about a pituitary d/o). Emotionally seems to be doing okay.  Available labs @ the epic system reviewed. She is already consulting with our nutritionist, next appointment tomorrow Will get the following: CMP, CBC, TSH, T3 T4 prolactin.  Also prealbumin, vitamin B, D, folic acid, HIV Get records from previous PCP and GI (Dr Michail Sermon), consider further eval. Nausea: Get GI records. RTC 2 months

## 2017-06-27 NOTE — Telephone Encounter (Signed)
ROI completed for Dr. Michail Sermon at [facility] and faxed to [phone]. ROI sent for scanning. Awaiting records.

## 2017-06-28 ENCOUNTER — Encounter: Payer: Self-pay | Admitting: Family Medicine

## 2017-06-28 ENCOUNTER — Ambulatory Visit (INDEPENDENT_AMBULATORY_CARE_PROVIDER_SITE_OTHER): Payer: 59 | Admitting: Family Medicine

## 2017-06-28 DIAGNOSIS — Z09 Encounter for follow-up examination after completed treatment for conditions other than malignant neoplasm: Secondary | ICD-10-CM | POA: Insufficient documentation

## 2017-06-28 DIAGNOSIS — R634 Abnormal weight loss: Secondary | ICD-10-CM | POA: Diagnosis not present

## 2017-06-28 LAB — IRON: IRON: 48 ug/dL (ref 42–145)

## 2017-06-28 LAB — FERRITIN: Ferritin: 5.9 ng/mL — ABNORMAL LOW (ref 10.0–291.0)

## 2017-06-28 NOTE — Assessment & Plan Note (Signed)
Underweight: Etiology unclear, reportedly all routine labs have been normal.  Her baseline weight was around 120, unable to go beyond 93 pounds now.  All this happened after she delivered her second baby (I wonder about a pituitary d/o). Emotionally seems to be doing okay.  Available labs @ the epic system reviewed. She is already consulting with our nutritionist, next appointment tomorrow Will get the following: CMP, CBC, TSH, T3 T4 prolactin.  Also prealbumin, vitamin B, D, folic acid, HIV Get records from previous PCP and GI (Dr Michail Sermon), consider further eval. Nausea: Get GI records. RTC 2 months

## 2017-06-28 NOTE — Patient Instructions (Addendum)
-   Your iron levels are low (hemoglobin, hematocrit, and ferritin).  Dr. Larose Kells may want to investigate further, but for now, I recommend you start supplementing with iron: 325 mg per day.    - For best absorption, take it with at least 200 mg of vitamin C, and take on an empty stomach (1 hour before or 2 hours after a meal). - Vitamin B12 also looks low.  Dr. Larose Kells may want to talk with you about this; you can supplement this as well.    - You are under-eating some of the food categories.  Pay close attention to the daily totals, and do what you can to make up the next day for any short-falls.    - Continue to include your exchanges on the RR.com app when you track intake.    - Target afternoon snack to add foods if you have come up short the day before.

## 2017-06-28 NOTE — Progress Notes (Signed)
Medical Nutrition Therapy:  Appt start time: 1000 end time:  1100. PCP   Kathlene November, MD  Assessment:  Primary concerns today: Unintentional Weight Loss (R63.4).  Stacy [name] has recently switched her PCP to the physician her husband sees, Dr. Kathlene November, whom she saw about her difficulty gaining weight.  Labwork revealed a low Hgb (10.5), Hct (32.4), ferritin (5.9), and B12  (184).    Her weight was 1.6 lb higher since 3 weeks ago, even though she was sick a couple of weeks ago (flu).  Stacy [name] has continued tracking intake and estimating exchanges using the HIPAA-compliant app [email or web address].  We again reviewed how she is counting exchanges (see below).  Although she said she is tallying daily amounts, it is not clear how she makes up for "missed" exchanges, as has been recommended.  I analyzed records from last Saturday also, which suggested Stacy [name] was nearly 500 kcal short of goal.    Recommended food plan:  Exchange CHO PRO FAT KCAL Starch 13 195 39 --- 1040 Protein 10 --- 63 27   550 Milk   1   12   8  ---     90 Fruit   2   30 --- ---   120 Veg   3   15   6  ---     75 Fat 12 --- --- 60   540 Totals  252 116 87 2415 Kcal             [phone]     (2255) %'s  45% 21% 35%  24-hr Recall (with corrections from Stacy [name]'s estimated exchanges): B (7:30 AM)-  2 large sausage- 2 protein, 1 fat Kaiser bread- 2 starch  Cheese- , 1 protein, 1 fat 1 slice tomato-  Snk (6:22)- 1/2 cup mixed nuts- , 2 protein,  8 fat   Lunch (11:30)- [employer]fil[employer] 2 chicken tenders- 2 protein, 2 fat  1 small chicken soup w/ crackers- 2 starch, 1 protein  1/2 mac & cheese- 1 starch, 1 fat 12 oz root beer- 2 starch Snk (4 PM)-  Chocolate candy- 1 fat  D (6 PM)-  2 1/2 cup chili w/ cheese-      c meat = 4 protein, 2 fat; 1 c beans = 2 protein, 1 starch; 1 c sauce&veg = 2 veg  Analysis of yesterday's exchanges:                         Actual                          Target              Over/Under Starch              [id number]                      13                     -5      - 400 Protein            [id number]              12                    +2      +110 Milk                 ---  1                     -1  -  90 Fruit                 ---                                    2                     -2       -120 Veg                  11                                    3                     -1       -  25 Fat                   [id number]        12                    +3      +135                 - 390      Progress Towards Goal(s):  In progress.   Nutritional Diagnosis:  Slight progress noted on Stacy [name]-3.1 Underweight As related to poor appetite.  As evidenced by weight gain of 1.6 lb in three weeks.    Intervention:  Nutrition education.  Handouts given during visit include:  AVS    Demonstrated degree of understanding via:  Teach Back   Monitoring/Evaluation:  Dietary intake and body weight in in 4 week(s).

## 2017-06-29 ENCOUNTER — Telehealth: Payer: Self-pay | Admitting: Internal Medicine

## 2017-06-29 NOTE — Telephone Encounter (Signed)
Advised patient: Labs show B12 and iron deficiency; she has mild anemia. Will wait to see the records from GI and previous PCP to see if further evaluation is needed. For now recommend the following B12 shots weekly x4 then monthly Start FeSO4 325 mg twice a day, 60, 6 refills. Follow-up in 2 months as recommended

## 2017-06-30 LAB — PROLACTIN: Prolactin: 14 ng/mL

## 2017-06-30 LAB — PREALBUMIN: PREALBUMIN: 22 mg/dL (ref 17–34)

## 2017-06-30 LAB — HIV ANTIBODY (ROUTINE TESTING W REFLEX): HIV 1&2 Ab, 4th Generation: NONREACTIVE

## 2017-06-30 LAB — VITAMIN D 1,25 DIHYDROXY
VITAMIN D 1, 25 (OH) TOTAL: 28 pg/mL (ref 18–72)
VITAMIN D3 1, 25 (OH): 28 pg/mL
Vitamin D2 1, 25 (OH)2: <8 pg/mL

## 2017-06-30 MED ORDER — FERROUS SULFATE 325 (65 FE) MG PO TABS: 325.0000 mg | ORAL_TABLET | Freq: Two times a day (BID) | ORAL | 6 refills | Status: DC

## 2017-06-30 NOTE — Telephone Encounter (Signed)
Spoke w/ Pt, she received voice message regarding lab results. She did she here nutritionist on 06/28/2017 (notes in chart)- they recommended she start OTC vitamin B12 supplements which she picked up yesterday. Wants to know if she could just take OTC supplements instead of shots. Please advise.

## 2017-06-30 NOTE — Telephone Encounter (Signed)
LMOM (mobile) informing Pt of lab results and recommendations. Instructed her to call office to schedule nurse visit for B12 injections once weekly for 4 weeks then once monthly. Also instructed her to start ferrous sulfate 1 tablet twice daily ([facility]).

## 2017-06-30 NOTE — Telephone Encounter (Signed)
Records received. Placed in MD red folder for review.  

## 2017-06-30 NOTE — Telephone Encounter (Signed)
Pt called in to get lab results.   Please advise.    CB: [phone]

## 2017-07-01 NOTE — Telephone Encounter (Signed)
PCP records reviewed -2015: Normal colonoscopy, EGD with reactive gastropathy. -April 2016: Weight 100 pounds. A nutritionist prescribed 2000 cal daily. -October 2016: CT abdomen -January 2017 PCP note:Weight 91 pounds.  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  Therapist rec to see a psychiatrist. -October 2017: Weight 90 pounds -Previously with no anemia GI records pending

## 2017-07-01 NOTE — Telephone Encounter (Signed)
Please advise patient that I do recommend injections.

## 2017-07-04 NOTE — Telephone Encounter (Signed)
Records received- forwarded to PCP.  

## 2017-07-04 NOTE — Telephone Encounter (Signed)
Spoke w/ Pt, informed of recommendations. Nurse visit scheduled for 07/14/2017 for first B12 injection.

## 2017-07-05 ENCOUNTER — Encounter: Payer: Self-pay | Admitting: Internal Medicine

## 2017-07-05 ENCOUNTER — Ambulatory Visit: Payer: 59 | Admitting: Family Medicine

## 2017-07-05 NOTE — Telephone Encounter (Signed)
GI records: EGD 02/14/2014: Duodenum biopsy negative.  No Giardia, no sprue changes.  Stomach: Congested and erythematous on the prepyloric region, BX: no H. pylori, + reactive gastropathy. Colonoscopy 02/14/2014: one polyp,Bx, Granulation tissue polyp 03/2014: Barium enema normal  Will discuss results on RTC, so far we found that she has low iron and B12.  Consider GI reevaluation, malabsorption?

## 2017-07-06 ENCOUNTER — Telehealth: Payer: Self-pay | Admitting: *Deleted

## 2017-07-06 NOTE — Telephone Encounter (Signed)
Received Medical records; forwarded to provider's MA, Kaylyn for review/SLS 11/21

## 2017-07-11 ENCOUNTER — Telehealth: Payer: Self-pay

## 2017-07-11 MED ORDER — NOVAFERRUM 50 50 MG PO CAPS: 1.0000 | ORAL_CAPSULE | Freq: Two times a day (BID) | ORAL | 1 refills | Status: DC

## 2017-07-11 NOTE — Telephone Encounter (Signed)
We can try different iron formulation. Change to NovaFerrum 50, it is over-the-counter medication, but is okay to send a prescription 1 tablet twice a day 60 and 1 refill.

## 2017-07-11 NOTE — Telephone Encounter (Signed)
Spoke w/ Pt, informed of recommendations. Novaferrum 50mg  sent to [facility].

## 2017-07-11 NOTE — Telephone Encounter (Signed)
Copied from [facility] [id number]. Topic: General - Other >> Jul 11, 2017  7:22 AM Lennox Solders wrote: Reason for CRM: pt is taking rx iron pills and the medication is making her bloated. Pt unable to eat this morning. Pt would like dr Larose Kells nurse to return her call

## 2017-07-11 NOTE — Telephone Encounter (Signed)
Ferrous sulfate causing bloating, please advise.

## 2017-07-12 NOTE — Telephone Encounter (Addendum)
Received call from Pt, novaferrum has been d/c. Please advise.

## 2017-07-13 MED ORDER — POLYSACCHARIDE IRON COMPLEX 150 MG PO CAPS: 150.0000 mg | ORAL_CAPSULE | Freq: Two times a day (BID) | ORAL | 1 refills | Status: DC

## 2017-07-13 NOTE — Telephone Encounter (Signed)
Iron polysaccharides (Niferex) 150mg  bid [facility] in [place].

## 2017-07-13 NOTE — Telephone Encounter (Signed)
The type of iron I recommend  to take is  polysaccharide-iron complex, many brands and names available, see below. rec toi talk w/ her pharmacist, many of the products are OTCs.: EZFE 200 [OTC];  Ferrex 150 [OTC];  Ferric x-150 [OTC];  iFerex 150 [OTC] [DSC];  Myferon 150 [OTC];  NovaFerrum 125 [OTC];  NovaFerrum 50 [OTC];  NovaFerrum Pediatric Drops [OTC];  Nu-Iron [OTC];  PIC 200 [OTC] [DSC];  Poly-Iron 150 [OTC]

## 2017-07-13 NOTE — Addendum Note (Signed)
Addended byDamita Dunnings D on: 07/13/2017 02:15 PM   Modules accepted: Orders

## 2017-07-14 ENCOUNTER — Ambulatory Visit (INDEPENDENT_AMBULATORY_CARE_PROVIDER_SITE_OTHER): Payer: 59

## 2017-07-14 DIAGNOSIS — E538 Deficiency of other specified B group vitamins: Secondary | ICD-10-CM

## 2017-07-14 MED ORDER — CYANOCOBALAMIN 1000 MCG/ML IJ SOLN
1000.0000 ug | Freq: Once | INTRAMUSCULAR | Status: AC
  Administered 2017-07-14: 1000 ug via INTRAMUSCULAR

## 2017-07-14 NOTE — Progress Notes (Addendum)
Pre visit review using our clinic tool,if applicable. No additional management support is needed unless otherwise documented below in the visit note.   Patient in for B12 injection per order from Dr. French Ana. Patient has B12 defiency.  Given 1000 mg IM left deltoid. Patient tolerated well.Stacy [name] Advised patient, she needs B12 shots weekly for the next 3 weeks, then monthly. Kathlene November, MD

## 2017-07-18 NOTE — Telephone Encounter (Signed)
Records from previous PCP reviewed.  First visit was 08/06/2013, weight at the time was 110 pounds. Liver mass seen on CTs was felt to be benign focal nodule hyperplasia; was follow-up by GI. She was diagnosed with anorexia, they were considering at some point the hospital admission, prealbumin at some point was 16 (low).

## 2017-07-21 ENCOUNTER — Ambulatory Visit (INDEPENDENT_AMBULATORY_CARE_PROVIDER_SITE_OTHER): Payer: 59

## 2017-07-21 DIAGNOSIS — E538 Deficiency of other specified B group vitamins: Secondary | ICD-10-CM | POA: Diagnosis not present

## 2017-07-21 MED ORDER — CYANOCOBALAMIN 1000 MCG/ML IJ SOLN
1000.0000 ug | Freq: Once | INTRAMUSCULAR | Status: AC
  Administered 2017-07-21: 1000 ug via INTRAMUSCULAR

## 2017-07-21 NOTE — Progress Notes (Signed)
Pre visit review using our clinic tool,if applicable. No additional management support is needed unless otherwise documented below in the visit note.   Patient in for B12 injection per order from Dr. French Ana due to patient having B12 deficiency.  Given 1000 mcg IM left deltoid. Patient tolerated well. No complaints voiced this visit.  Return appointment given for next 2 visits.

## 2017-07-26 ENCOUNTER — Ambulatory Visit: Payer: 59 | Admitting: Family Medicine

## 2017-07-28 ENCOUNTER — Ambulatory Visit (INDEPENDENT_AMBULATORY_CARE_PROVIDER_SITE_OTHER): Payer: 59

## 2017-07-28 DIAGNOSIS — E538 Deficiency of other specified B group vitamins: Secondary | ICD-10-CM

## 2017-07-28 MED ORDER — CYANOCOBALAMIN 1000 MCG/ML IJ SOLN
1000.0000 ug | Freq: Once | INTRAMUSCULAR | Status: AC
  Administered 2017-07-28: 1000 ug via INTRAMUSCULAR

## 2017-07-28 NOTE — Progress Notes (Addendum)
Pre visit review using our clinic tool,if applicable. No additional management support is needed unless otherwise documented below in the visit note.   Patient in for B12 injection per order from Dr. Larose Kells due to patient having B12 deficiency.  Given 1000 mg IM L deltoid. Patient tolerated well.  Patient has next injection scheduled.  Kathlene November, MD

## 2017-08-04 ENCOUNTER — Ambulatory Visit (INDEPENDENT_AMBULATORY_CARE_PROVIDER_SITE_OTHER): Payer: 59

## 2017-08-04 DIAGNOSIS — E538 Deficiency of other specified B group vitamins: Secondary | ICD-10-CM | POA: Diagnosis not present

## 2017-08-04 MED ORDER — CYANOCOBALAMIN 1000 MCG/ML IJ SOLN
1000.0000 ug | Freq: Once | INTRAMUSCULAR | Status: AC
  Administered 2017-08-04: 1000 ug via INTRAMUSCULAR

## 2017-08-04 NOTE — Progress Notes (Addendum)
Pre visit review using our clinic tool,if applicable. No additional management support is needed unless otherwise documented below in the visit note.   Patient in for B12 injection per order from Dr. French Ana due to patient having B12 deficiency. Patient in for 4th of 4 weekly injections.  Given 1000 mcg IM Right deltoid. Patient tolerated well. No complaints voiced.  Return appointment scheduled for 1 month.  Kathlene November, MD

## 2017-09-01 ENCOUNTER — Other Ambulatory Visit: Payer: Self-pay | Admitting: Internal Medicine

## 2017-09-02 ENCOUNTER — Ambulatory Visit (INDEPENDENT_AMBULATORY_CARE_PROVIDER_SITE_OTHER): Payer: 59

## 2017-09-02 DIAGNOSIS — E538 Deficiency of other specified B group vitamins: Secondary | ICD-10-CM

## 2017-09-02 MED ORDER — CYANOCOBALAMIN 1000 MCG/ML IJ SOLN
1000.0000 ug | Freq: Once | INTRAMUSCULAR | Status: AC
  Administered 2017-09-02: 1000 ug via INTRAMUSCULAR

## 2017-09-02 NOTE — Progress Notes (Signed)
Pre visit review using our clinic tool,if applicable. No additional management support is needed unless otherwise documented below in the visit note.   Patient in for B12 injection per order from Dr. Kathlene November, Patiet has B12 deficiency.   Patient states the injections have given her more energy.   1000 mcg given IM left deltoid. Patient tolerated well.  Return appointment given for 1 month.

## 2017-09-06 ENCOUNTER — Ambulatory Visit (INDEPENDENT_AMBULATORY_CARE_PROVIDER_SITE_OTHER): Payer: 59 | Admitting: Family Medicine

## 2017-09-06 ENCOUNTER — Encounter: Payer: Self-pay | Admitting: Family Medicine

## 2017-09-06 DIAGNOSIS — R634 Abnormal weight loss: Secondary | ICD-10-CM

## 2017-09-06 NOTE — Patient Instructions (Signed)
Honor your hunger:  ANY time you feel hungry, get a meal or snack.  Take advantage of hunger.    Aim for getting:   - At least 3 carb portions at breakfast; at least 4 portions at lunch & dinner; and 1-2 carbs for every snack.    - At least 1 meaningful protein food per meal = >3 oz meat equivalent at breakfast, and >3 oz meat equivalent for lunch and >6 oz for dinner.   - One milk source (for calcium) each day.   - Sweets and add-ons (condiments) as desired.    Food  Bkfst Lunch Dinner Carb foods  3  4  4  Pro foods  3  3  6  Milk food 1 anytime during the day.     Track intake (actuall and all foods OR just carbs, protein foods, and milk foods) on http://www.richardson.info/.    Remember that you are operating at an energy deficit, so to gain weight right now will require eating MORE calories than you are probably comfortable with.  This will get easier as you are able to re-nourish yourself (and restore weight).

## 2017-09-06 NOTE — Progress Notes (Signed)
Medical Nutrition Therapy:  Appt start time: 1000 end time:  1100. PCP   Kathlene November, MD  Assessment:  Primary concerns today: Unintentional Weight Loss (R63.4).  Stacy [name] was surprised at her 1-lb weight loss b/c she thought she'd been eating well.  Also feels a lot better since getting B12 injections the past several weeks.  Will be getting blood tests this week, hoping to see what hgb, hct, and ferritin levels are.  Hbg was <4 and ferritin <6 in November.   I suspect that Stacy [name] may have lost some weight when she was struggling with abdominal discomfort while taking  ferrous sulphate bid.  This has been alleviated now that she is taking a polysaccharide Fe complex.  She does not weigh herself, so doesn't know recent weight history.    Recommended food plan:  Exchange CHO PRO FAT KCAL Starch 13 195 39 --- 1040 Protein 10 --- 63 27   550 Milk   1   12   8  ---     90 Fruit   2   30 --- ---   120 Veg   3   15   6  ---     75 Fat 12 --- --- 60   540 Totals  252 116 87 2415 Kcal             1008 464 783     (2255) %'s  45% 21% 35%  24-hr recall suggests intake of 2100-2200 kcal:  (Up at 7:30 AM; took Fe supplement right away) B (8:30 AM)-  5 tbsp hummus, 1/2 c blueberries, 2 links [place] sausage, 1/2 pita, water    Snk (10 AM)-  1 cannoli, 1 c tea L (12:30)-  6 oz salmon, 1 med baked potato, 1 tbsp butter, 1 tbsp ranch, 1 oz chs, 1/2 c mixed veg's, water Snk (3 PM)-  2 cc cookies, 1 c veggie-chips, 12 oz Coke D (7 PM)-  3 oz ham, 3 oz lamb, 6 grape leaves, 1 Hawaiian roll, water Snk ( PM)-  --- Typical day? Yes.    Analysis of yesterday's exchanges:                         Actual                          Target              Over/Under Starch           IIIII IIIII III                         13                     Protein          IIIII IIIII III                 12                    +1 +55 Milk                 ---                                   1                    -  1 - 90 Fruit               I                                       2                    - 1 - 60 Veg               II                                       3               - 1        - 25 Fat                IIIII IIIII I              12                - 1      - 45                -220     Progress Towards Goal(s):  In progress.   Nutritional Diagnosis:  No progress noted on Stacy [name] Underweight As related to poor appetite.  As evidenced by weight loss of 1 lb in past 8 weeks.    Intervention:  Nutrition education.  Handouts given during visit include:  AVS    Demonstrated degree of understanding via:  Teach Back   Monitoring/Evaluation:  Dietary intake and body weight in in 4 week(s).

## 2017-09-08 ENCOUNTER — Ambulatory Visit: Payer: 59 | Admitting: Internal Medicine

## 2017-09-08 ENCOUNTER — Encounter: Payer: Self-pay | Admitting: Internal Medicine

## 2017-09-08 VITALS — BP 98/70 | HR 102 | Temp 97.9°F | Resp 14 | Ht 63.0 in | Wt 89.4 lb

## 2017-09-08 DIAGNOSIS — R634 Abnormal weight loss: Secondary | ICD-10-CM

## 2017-09-08 DIAGNOSIS — D649 Anemia, unspecified: Secondary | ICD-10-CM | POA: Diagnosis not present

## 2017-09-08 DIAGNOSIS — Z09 Encounter for follow-up examination after completed treatment for conditions other than malignant neoplasm: Secondary | ICD-10-CM | POA: Diagnosis not present

## 2017-09-08 LAB — CBC WITH DIFFERENTIAL/PLATELET
Basophils Absolute: 0 10*3/uL (ref 0.0–0.1)
Basophils Relative: 0.3 % (ref 0.0–3.0)
EOS PCT: 0.6 % (ref 0.0–5.0)
Eosinophils Absolute: 0 10*3/uL (ref 0.0–0.7)
HCT: 34.4 % — ABNORMAL LOW (ref 36.0–46.0)
Hemoglobin: 11.6 g/dL — ABNORMAL LOW (ref 12.0–15.0)
LYMPHS ABS: 2 10*3/uL (ref 0.7–4.0)
Lymphocytes Relative: 33.7 % (ref 12.0–46.0)
MCHC: 33.7 g/dL (ref 30.0–36.0)
MCV: 86.9 fl (ref 78.0–100.0)
MONO ABS: 0.5 10*3/uL (ref 0.1–1.0)
Monocytes Relative: 8.1 % (ref 3.0–12.0)
NEUTROS ABS: 3.5 10*3/uL (ref 1.4–7.7)
Neutrophils Relative %: 57.3 % (ref 43.0–77.0)
PLATELETS: 359 10*3/uL (ref 150.0–400.0)
RBC: 3.96 Mil/uL (ref 3.87–5.11)
RDW: 14.7 % (ref 11.5–15.5)
WBC: 6 10*3/uL (ref 4.0–10.5)

## 2017-09-08 LAB — IRON: IRON: 56 ug/dL (ref 42–145)

## 2017-09-08 LAB — FERRITIN: Ferritin: 25.9 ng/mL (ref 10.0–291.0)

## 2017-09-08 NOTE — Patient Instructions (Signed)
GO TO THE LAB : Get the blood work     GO TO THE FRONT DESK Schedule your next appointment for a  Check up in 3 months  

## 2017-09-08 NOTE — Progress Notes (Signed)
Pre visit review using our clinic review tool, if applicable. No additional management support is needed unless otherwise documented below in the visit note. 

## 2017-09-08 NOTE — Progress Notes (Addendum)
Subjective:    Patient ID: Stacy [name], female    DOB: 11/19/1980, 37 y.o.   MRN: [id number]  DOS:  09/08/2017 Type of visit - description : Follow-up Interval history: Since the last office visit she is doing okay. Diagnosed with iron deficiency and B12 deficiency, on supplements. Feels like she has more energy. As far as her weight, she has only lost 1 pound. She sees a nutritionist Reports she is eating " all she can".  Wt Readings from Last 3 Encounters:  09/08/17 89 lb 6 oz (40.5 kg)  09/06/17 90 lb 9.6 oz (41.1 kg)  06/28/17 91 lb 9.6 oz (41.5 kg)     Review of Systems Denies depression, anxiety. Denies suicidal ideas Denies self inducing vomiting or laxative use. No abdominal pain, occasionally has nausea vomiting but does not frequent or severe. Periods  are normal except in December when  she had her periods 3 times.   Past Medical History:  Diagnosis Date  . Anxiety   . Benign liver cyst   . GERD (gastroesophageal reflux disease)   . History of UTI     Past Surgical History:  Procedure Laterality Date  . CESAREAN SECTION     twice 2010, 2013  . Esure  ~2015    Social History   Socioeconomic History  . Marital status: Married    Spouse name: Not on file  . Number of children: Not on file  . Years of education: Not on file  . Highest education level: Not on file  Social Needs  . Financial resource strain: Not on file  . Food insecurity - worry: Not on file  . Food insecurity - inability: Not on file  . Transportation needs - medical: Not on file  . Transportation needs - non-medical: Not on file  Occupational History  . 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 Smoking status: Never Smoker  . Smokeless tobacco: Never Used  Substance and Sexual Activity  . Alcohol use: No    Comment: socially   . Drug use: No  . Sexual activity: Yes    Birth control/protection: None  Other Topics Concern  . Not on file  Social  History Narrative   Household: pt, husband , 2 children   2 boys: 2010, 2013   P2G2   Original  from [place], no foreign trips      Allergies as of 09/08/2017      Reactions   Tetracyclines & Related Swelling   Swelling in spine; required spinal tap.   Erythromycin Other (See Comments)   unknown   Raspberry Rash   Sulfonamide Derivatives Rash      Medication List        Accurate as of 09/08/17 11:03 AM. Always use your most recent med list.          calcium carbonate 200 MG capsule Take 250 mg by mouth 2 (two) times daily with a meal.   cholecalciferol 1000 units tablet Commonly known as:  VITAMIN D Take 2,000 Units by mouth daily.   FERREX 150 150 MG capsule Generic drug:  iron polysaccharides TAKE 1 CAPSULE BY MOUTH TWICE A DAY   omeprazole 20 MG capsule Commonly known as:  PRILOSEC Take 1 capsule (20 mg total) by mouth daily.   ondansetron 4 MG disintegrating tablet Commonly known as:  ZOFRAN ODT Take 1 tablet (4 mg total) by mouth every 8 (eight) hours as needed for nausea.  Objective:   Physical Exam BP 98/70 (BP Location: Left Arm, Patient Position: Sitting, Cuff Size: Small)   Pulse (!) 102   Temp 97.9 F (36.6 C) (Oral)   Resp 14   Ht 5\' 3"  (1.6 m)   Wt 89 lb 6 oz (40.5 kg)   LMP 08/22/2017 (Exact Date)   SpO2 97%   BMI 15.83 kg/m  General:   Well developed, underweight appearing. NAD.  HEENT:  Normocephalic . Face symmetric, atraumatic Lungs:  CTA B Normal respiratory effort, no intercostal retractions, no accessory muscle use. Heart: RRR,  no murmur.  No pretibial edema bilaterally  Skin: Not pale. Not jaundice Neurologic:  alert & oriented X3.  Speech normal, gait appropriate for age and unassisted Psych--  Cognition and judgment appear intact.  Cooperative with normal attention span and concentration.  Behavior appropriate. No anxious or depressed appearing.      Assessment & Plan:    Assessment Underweight  Per  pt:Wt when she finished high school : 120 pounds.  After her second pregnancy in 2013 her weight was 140 pounds. Since 2013 she started to lose weight steadily, the smallest she has been is 85 pounds. 08-2015: 91 pounds.  05-2016 90 pounds GERD Benign liver cyst H/o anxiety Gyn: Dr Rogue Bussing @ [facility]  GI:  -EGD 02/14/2014: Duodenum biopsy negative.  No Giardia, no sprue changes.  Stomach: Congested and erythematous on the prepyloric region, BX: no H. pylori, + reactive gastropathy. -Colonoscopy 02/14/2014: one polyp,Bx, Granulation tissue polyp -03/2014: Barium enema normal -Abnormal liver imaging: CT 01/2016, ultrasound 08-2016 >> finally got a MRI 08-2016 see report: 1. No biliary ductal dilatation. Common bile duct diameter 5 mm. No cholelithiasis. No choledocholithiasis. No biliary strictures.  2. 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 3. Caudate lobe hemangioma is mildly increased in size since 08/18/2013 MRI. 4. Atypical liver lesion in the posterior right liver lobe is stable in size since 08/18/2013 MRI, most consistent with a benign lesion such as a sclerosed hemangioma. 5. Otherwise unremarkable MRI abdomen. S/p ESURE 2015  Plan: Underweight: Stable, lost 1 pound only.  No anxiety or depression at this point. TSH was normal, prolactin normal.  Will call her nutritionist to discuss. Addendum(09/14/17) I spoke with Stacy [name] RD, she has worked with Stacy [name] for a while. Like me, she suspects a eating disorder but has not discussed that openly w/ pt yet; she is suspect her calorie reporting is not necessarily accurate and that anxiety likely plays a role on her weight loss.  We agreed to stay in contact, consider refer her to a eating disorder specialist.  Anemia, iron deficiency:  had colonoscopy and EGD  2015. Currently with no GI sxs.  Records from 2017: No anemia. We started iron supplements which she is tolerating very  well.  I also recommend to take a multivitamin.  Recheck a CBC, iron, ferritin. B12 deficiency: On supplements RTC 3 months

## 2017-09-09 NOTE — Assessment & Plan Note (Addendum)
  Underweight: Stable, lost 1 pound only.  No anxiety or depression at this point. TSH was normal, prolactin normal.  Will call her nutritionist to discuss. Addendum(09/14/17) I spoke with Stacy [name] RD, she has worked with Stacy [name] for a while. Like me, she suspects a eating disorder but has not discussed that openly w/ pt yet; she is suspect her calorie reporting is not necessarily accurate and that anxiety likely plays a role on her weight loss.  We agreed to stay in contact, consider refer her to a eating disorder specialist.  Anemia, iron deficiency:  had colonoscopy and EGD  2015. Currently with no GI sxs.  Records from 2017: No anemia. We started iron supplements which she is tolerating very well.  I also recommend to take a multivitamin.  Recheck a CBC, iron, ferritin. B12 deficiency: On supplements RTC 3 months

## 2017-09-30 ENCOUNTER — Ambulatory Visit (INDEPENDENT_AMBULATORY_CARE_PROVIDER_SITE_OTHER): Payer: 59

## 2017-09-30 DIAGNOSIS — E538 Deficiency of other specified B group vitamins: Secondary | ICD-10-CM | POA: Diagnosis not present

## 2017-09-30 MED ORDER — CYANOCOBALAMIN 1000 MCG/ML IJ SOLN
1000.0000 ug | Freq: Once | INTRAMUSCULAR | Status: AC
  Administered 2017-09-30: 1000 ug via INTRAMUSCULAR

## 2017-09-30 NOTE — Progress Notes (Signed)
Pt came in today for B12 injection. Pt received injection in Left Arm at 1020. Pt scheduled follow up for 10/28/17. Injection given without complication and even stated that she's been feeling better since receiving injections.

## 2017-10-04 ENCOUNTER — Ambulatory Visit: Payer: 59 | Admitting: Family Medicine

## 2017-10-07 DIAGNOSIS — Z124 Encounter for screening for malignant neoplasm of cervix: Secondary | ICD-10-CM | POA: Diagnosis not present

## 2017-10-07 DIAGNOSIS — Z01419 Encounter for gynecological examination (general) (routine) without abnormal findings: Secondary | ICD-10-CM | POA: Diagnosis not present

## 2017-10-07 LAB — HM PAP SMEAR

## 2017-10-11 ENCOUNTER — Encounter: Payer: Self-pay | Admitting: Internal Medicine

## 2017-10-11 ENCOUNTER — Ambulatory Visit (INDEPENDENT_AMBULATORY_CARE_PROVIDER_SITE_OTHER): Payer: 59 | Admitting: Family Medicine

## 2017-10-11 ENCOUNTER — Encounter: Payer: Self-pay | Admitting: Family Medicine

## 2017-10-11 DIAGNOSIS — R634 Abnormal weight loss: Secondary | ICD-10-CM

## 2017-10-11 NOTE — Patient Instructions (Addendum)
-   Ginger (fresh or ground) may help iron absorption.  Try to incorporate daily to your diet.  (Add to coffee, cereal, cooked foods, yogurt). You could add this to a chocolate (or other flavored) milk drink that you take with you each day in place of water.  Aim for 16-20 oz.  (If you use soy milk, be sure to shake vigorously before each use.)  This may help meet your calcium needs, and is a little boost to energy intake (and if you consistently add ginger, this may also help your iron absorption.)  - I will arrange for the RR.com app to include your target exchanges for protein foods, starch foods, and at least one daily milk.  These new targets will be PER MEAL rather than daily.    Your Exchange Goals:  Food                Bkfst    Lunch  Dinner Snacks Carb foods       3          4          4   3  Pro foods*       2          3          6   1  Milk food          1 anytime during the day.    *Any 8 oz more than 1 cup you get from a milk drink can count as a protein.

## 2017-10-11 NOTE — Progress Notes (Signed)
Medical Nutrition Therapy:  Appt start time: 1000 end time:  1100. PCP   Kathlene November, MD  Assessment:  Primary concerns today: Unintentional Weight Loss (R63.4).  Stacy [name] will be getting an [place] Monday b/c she has been menstrual cycles about every other per month.  This is concerning b/c of her already marginal Hgb and Hct (11.6 and 34.4 1/24).    She has been recording intake on http://www.richardson.info/, although quantities are sometimes missing, and she has not always estimated exchanges, nor does Stacy [name] demonstrate a good knowledge of how to determine exchange equivalents despite having reviewed this a number of times in MNT appts.    I suggested we increase carb and protein exchanges slightly at today's visit, and also to (again) focus only on those two food types, in addn to getting at least one significant Ca source daily.  Toward this end, and hoping it makes it easier for Stacy [name] to track, I will work on setting up Exchange targets in the RecoveryRecord.com app for her.    24-hr recall suggests intake of ~2100 kcal (although I suspect some portion sizes are overestimated):  (Up at  AM) B (8 AM)-  2 cornbread       2 st  Snk (10 AM)-  4 oz Activia yogurt      1/2 milk L (11:30 PM)-  1 c chx teryaki (3 oz chx), 2 1/2 c rice, 1 spring roll   3 pro, 3 st Snk (5 PM)-  1 c Chex Mix       1 st D (8 PM)-  2 c Japanese (3 oz) filet & (3 oz) shrimp, 3 c rice   6 pro, 3 st Snk ( PM)-  --- Typical day? No.  Didn't feel well in the morning.    Recommended food plan:  Exchange CHO PRO FAT KCAL Starch 13 195 39 --- 1040 Protein 10 --- 63 27   550 Milk   1   12   8  ---     90 Fruit   2   30 --- ---   120 Veg   3   15   6  ---     75 Fat 12 --- --- 60   540 Totals  252 116 87 2415 Kcal             [phone]     (2255) %'s  45% 21% 35%  24-hr recall suggests intake of 2100-2200 kcal:  (Up at 7:30 AM; took Fe supplement right away) B (8:30 AM)-  5 tbsp hummus, 1/2 c blueberries, 2 links [place] sausage, 1/2 pita,  water    Snk (10 AM)-  1 cannoli, 1 c tea L (12:30)-  6 oz salmon, 1 med baked potato, 1 tbsp butter, 1 tbsp ranch, 1 oz chs, 1/2 c mixed veg's, water Snk (3 PM)-  2 cc cookies, 1 c veggie-chips, 12 oz Coke D (7 PM)-  3 oz ham, 3 oz lamb, 6 grape leaves, 1 Hawaiian roll, water Snk ( PM)-  --- Typical day? Yes.                Progress Towards Goal(s):  In progress.   Nutritional Diagnosis:  No meaningful progress noted on [place]-3.1 Underweight As related to poor appetite.  As evidenced by stable weight since appt in January.    Intervention:  Nutrition education.  Handouts given during visit include:  AVS    Demonstrated degree of understanding via:  Teach Back  Monitoring/Evaluation:  Dietary intake and body weight in in 5 week(s).

## 2017-10-17 DIAGNOSIS — N926 Irregular menstruation, unspecified: Secondary | ICD-10-CM | POA: Diagnosis not present

## 2017-10-17 DIAGNOSIS — Z3202 Encounter for pregnancy test, result negative: Secondary | ICD-10-CM | POA: Diagnosis not present

## 2017-10-26 ENCOUNTER — Other Ambulatory Visit: Payer: Self-pay | Admitting: Internal Medicine

## 2017-10-28 ENCOUNTER — Ambulatory Visit (INDEPENDENT_AMBULATORY_CARE_PROVIDER_SITE_OTHER): Payer: 59

## 2017-10-28 DIAGNOSIS — E538 Deficiency of other specified B group vitamins: Secondary | ICD-10-CM | POA: Diagnosis not present

## 2017-10-28 MED ORDER — CYANOCOBALAMIN 1000 MCG/ML IJ SOLN: 1000.0000 ug | Freq: Once | INTRAMUSCULAR | Status: DC

## 2017-10-28 NOTE — Progress Notes (Addendum)
Pt here today for B12 injection #1. 14mL injected into L deltoid. Pt tolerated injection well. Next injection 1 month  Nurse visit scheduled for 11/29/2017.   Patient states that since she has been getting the injections she can tell a difference in her energy level. She states the injections are doing well for her.   Stacy November, MD

## 2017-11-01 DIAGNOSIS — J3489 Other specified disorders of nose and nasal sinuses: Secondary | ICD-10-CM | POA: Diagnosis not present

## 2017-11-01 DIAGNOSIS — J329 Chronic sinusitis, unspecified: Secondary | ICD-10-CM | POA: Diagnosis not present

## 2017-11-01 DIAGNOSIS — R05 Cough: Secondary | ICD-10-CM | POA: Diagnosis not present

## 2017-11-14 ENCOUNTER — Other Ambulatory Visit: Payer: Self-pay | Admitting: Internal Medicine

## 2017-11-15 ENCOUNTER — Ambulatory Visit (INDEPENDENT_AMBULATORY_CARE_PROVIDER_SITE_OTHER): Payer: 59 | Admitting: Family Medicine

## 2017-11-15 ENCOUNTER — Encounter: Payer: Self-pay | Admitting: Family Medicine

## 2017-11-15 DIAGNOSIS — R634 Abnormal weight loss: Secondary | ICD-10-CM

## 2017-11-15 NOTE — Progress Notes (Signed)
Medical Nutrition Therapy:  Appt start time: 1000 end time:  1100. PCP   Kathlene November, MD  Assessment:  Primary concerns today: Unintentional Weight Loss (R63.4).  Stacy [name] has been recording daily intake, including exchanges of protein and starch foods, in http://www.richardson.info/.  She has sometimes been drinking flavored milk drinks, which is at least helpful in meeting her Ca needs.  She has tried 20 oz at a time, which she's usually found to be too much to tolerate well, but she especially likes strawberry-flavored milk.    We reviewed how to count carb exchanges again today.  It is difficult to know how often Jennefer is accurately estimating her serving sizes/exchanges.  One example of overestimating intake was her categorization of a serving of onion rings as 4 carb exchanges.  A more accurate assessment would be 2.  I encouraged her to underestimate if not sure, or to contact me with questions.    Recommended food plan:  Exchange CHO PRO FAT KCAL Starch 14 210 42 --- 1120 Protein 12 --- 77 33   660 Milk   1   12   8  ---     90 Fruit   2   30 --- ---   120 Veg   3   15   6  ---     75 Fat 12 --- --- 60   540 Totals  267    133 93 2915 Kcal             1068  532      837       (2437) %'s  44%     22%    34%  24-hr recall not done b/c Trevia had thrown up Monday ~1 AM, and didn't feel well again till late-afternoon.          Progress Towards Goal(s):  In progress.   Nutritional Diagnosis:  Possible progress - ? - on [place]-3.1 Underweight As related to limited appetite.  As evidenced by weight gain of 0.8 lb, at least indicative of not losing ground in the past month.    Intervention:  Nutrition education.  Handouts given during visit include:  AVS    Demonstrated degree of understanding via:  Teach Back   Monitoring/Evaluation:  Dietary intake and body weight in in 5 week(s).

## 2017-11-15 NOTE — Patient Instructions (Addendum)
-   When you use up the calcium supplement you currently have, switch to calcium citrate.  This type of calcium does not need to be taken with food for best absorption.  (Take separately from your iron supplement.)  - Since 20 oz of milk has felt overwhelming for you, let's try just 12 oz of milk.  If you feel the dairy milk is not working well for you, try soy milk.    Your Exchange Goals remain the same:   FoodBkfstLunchDinner    Snacks Carb foods344              3 Pro foods* 2 36              1 Milk food1 anytime during the day.   - Aiming for your exchange goals:   1. Be deliberate.  This means you may need to eat more than you are in the mood for at times.    2. As you have been doing, assess your totals at the end of the day.  Make plans for the next day if you have come up short:  How will you be sure you'll meet your exchanges tomorrow?   - Something that may help with this is to design some "go-to" lunches and breakfasts that meet those exchanges.    3. Estimating your exchanges:  If you're not sure how to accurately categorize, under- vs. over-estimate OR email Jeannie with exact food and serving size.

## 2017-11-29 ENCOUNTER — Ambulatory Visit (INDEPENDENT_AMBULATORY_CARE_PROVIDER_SITE_OTHER): Payer: 59

## 2017-11-29 VITALS — Wt 90.5 lb

## 2017-11-29 DIAGNOSIS — E538 Deficiency of other specified B group vitamins: Secondary | ICD-10-CM | POA: Diagnosis not present

## 2017-11-29 DIAGNOSIS — R634 Abnormal weight loss: Secondary | ICD-10-CM

## 2017-11-29 MED ORDER — CYANOCOBALAMIN 1000 MCG/ML IJ SOLN
1000.0000 ug | Freq: Once | INTRAMUSCULAR | Status: AC
  Administered 2017-11-29: 1000 ug via INTRAMUSCULAR

## 2017-11-29 NOTE — Progress Notes (Addendum)
Pre visit review using our clinic review tool, if applicable. No additional management support is needed unless otherwise documented below in the visit note.  Pt here today for B12 injection. 43mL injected into R deltoid. Pt tolerated injection well. Pt weighed today- 90.8lb w/ clothes and shoes.  Next B12 in 4 weeks. Nurse visit scheduled 12/29/2017.   Kathlene November, MD

## 2017-12-13 ENCOUNTER — Ambulatory Visit: Payer: 59 | Admitting: Internal Medicine

## 2017-12-20 ENCOUNTER — Ambulatory Visit: Payer: 59 | Admitting: Family Medicine

## 2017-12-29 ENCOUNTER — Ambulatory Visit (INDEPENDENT_AMBULATORY_CARE_PROVIDER_SITE_OTHER): Payer: 59

## 2017-12-29 DIAGNOSIS — E538 Deficiency of other specified B group vitamins: Secondary | ICD-10-CM

## 2017-12-29 MED ORDER — CYANOCOBALAMIN 1000 MCG/ML IJ SOLN
1000.0000 ug | Freq: Once | INTRAMUSCULAR | Status: AC
Start: 2017-12-29 — End: 2017-12-29
  Administered 2017-12-29: 1000 ug via INTRAMUSCULAR

## 2017-12-29 NOTE — Progress Notes (Addendum)
Pre visit review using our clinic review tool, if applicable. No additional management support is needed unless otherwise documented below in the visit note.  Pt here today for B12 injection. 75mL injected into L deltoid. Pt tolerated injection well.   Next B12 in 4 weeks. Nurse visit scheduled 01/26/2018.  Agree with administration of B12 for diagnosis  b12 deficiency.  Mackie Pai, PA-C

## 2018-01-17 ENCOUNTER — Ambulatory Visit (INDEPENDENT_AMBULATORY_CARE_PROVIDER_SITE_OTHER): Payer: 59 | Admitting: Family Medicine

## 2018-01-17 ENCOUNTER — Encounter: Payer: Self-pay | Admitting: Family Medicine

## 2018-01-17 DIAGNOSIS — R634 Abnormal weight loss: Secondary | ICD-10-CM | POA: Diagnosis not present

## 2018-01-17 NOTE — Patient Instructions (Addendum)
-   The next time you feel queasy (not waiting till you actually feel nauseous), take the Zofran.    - When you know your cycle is coming, avoid highly spiced foods as well as high-fat foods.    - With your weight being so low, you can't afford to have these times of severely reduced food intake.   - Recommendation:  Try Fairlife milk for your strawberry milk.  Start with no more than one cup at a time, and experiment to see if you can tolerate 12 oz.  This would boost your intake of both protein and calcium.   - You have been doing pretty well on protein foods and your one dairy/day, but coming up short on carb foods for many meals.   - Suggestion:  Look for carbs that are not also loaded with fat.  You may do better with bread vs. croissant, for example b/c of problems with reflux and nausea.  Sausage or many processed meats as well as fried foods or heavy sauces like alfredo are probably harder for you to digest.    - Aim for bigger portions of carb foods, for example a full cup of potatoes rather than 1/2 cup.   Your Exchange Goals remain the same:   FoodBkfstLunchDinnerSnacks Carb foods3 4 43 Pro foods* 2 3 61 Milk food1 anytime during the day.    *Any 8 oz more than 1 cup you get from a milk drink can count as a protein.    Record your intake and daily totals of carb, protein, and milk on [email or web address].info/.

## 2018-01-17 NOTE — Progress Notes (Signed)
Medical Nutrition Therapy:  Appt start time: 1000 end time:  1100. PCP   Kathlene November, MD  Assessment:  Primary concerns today: Unintentional Weight Loss (R63.4).  Stacy [name] has had some times in the past couple months when she hasn't been able to eat.  Usually when she has her menstrual cycle, she is queasy for a couple days before and during.  Last month she had pretty severe N&V for about three days.  She took Zofran only once during those three days.  She doesn't like to take it if not absolutely needed b/c it makes her sleepy.    Laysha has stopped drinking the strawberry-flavored milk she had been having pretty regularly.  She said it has caused her some GI discomfort.    When asked what her carb and protein exchange goals are for each meal, Shantanique was unable to list the numbers, suggesting that although she is tracking in [email or web address].richardson.info/, she is not actually aiming for the amount of carbs and protein foods recommended.  Food records indicate she is coming up short on carbs at many meals.  I still suspect she overestimates portion sizes as well.  Teliah insists that she is eating as much as she can tolerate on most days.    Recommended food plan:  Exchange CHO PRO FAT KCAL Starch 14 210 42 --- 1120 Protein 12 --- 77 33   660 Milk   1   12   8  ---     90 Fruit   2   30 --- ---   120 Veg   3   15   6  ---     75 Fat 12 --- --- 60   540 Totals  267    133 93 2915 Kcal             1068  532      837       (2437) %'s  44%     22%    34%  24-hr recall:  (Up at  AM) B (9 AM)-  1 large croissant, 3 [place] sausage, 1 slc provolone Snk (10 AM)-  8 oz Activia yogurt L (1 PM)-  4 chx tenders, 1/2 c grn bns, 1/2 c mashed pot's, gravy, 1/2 c stewed apples, 1/2 large corn bread, water Snk (4:30)-  2 cc cookies D (7:30 PM)-  6 oz chx, 1/2 c mac&chs, 1/2 c bkd beans, 1 corn on cob, water Snk (8 PM)-  1/2 c chips Typical day? Yes.  (for a good day.)  Progress Towards Goal(s):  In progress.   Nutritional  Diagnosis:  No discernible progress on [place]-3.1 Underweight As related to limited appetite.  As evidenced by weight gain of 0.8 lb, at least indicative of not losing ground in the past month.    Intervention:  Nutrition education.  Handouts given during visit include:  AVS    Demonstrated degree of understanding via:  Teach Back   Monitoring/Evaluation:  Dietary intake and body weight in in 4 week(s).

## 2018-01-20 ENCOUNTER — Ambulatory Visit: Payer: 59 | Admitting: Internal Medicine

## 2018-01-20 ENCOUNTER — Encounter: Payer: Self-pay | Admitting: Internal Medicine

## 2018-01-20 VITALS — BP 106/70 | HR 87 | Temp 97.7°F | Resp 16 | Ht 63.0 in | Wt 91.0 lb

## 2018-01-20 DIAGNOSIS — E559 Vitamin D deficiency, unspecified: Secondary | ICD-10-CM

## 2018-01-20 DIAGNOSIS — D509 Iron deficiency anemia, unspecified: Secondary | ICD-10-CM | POA: Diagnosis not present

## 2018-01-20 DIAGNOSIS — R636 Underweight: Secondary | ICD-10-CM | POA: Diagnosis not present

## 2018-01-20 DIAGNOSIS — E539 Vitamin B deficiency, unspecified: Secondary | ICD-10-CM

## 2018-01-20 NOTE — Progress Notes (Signed)
Subjective:    Patient ID: Stacy [name], female    DOB: 05-24-1981, 37 y.o.   MRN: [id number]  DOS:  01/20/2018 Type of visit - description : f/u Interval history: Since the last office visit, she is doing well. Still has occasional nausea, usually related to her menstrual cycles. Emotionally feels great. She sees her nutritionist regularly, working on healthy but an abundant diet.  Wt Readings from Last 3 Encounters:  01/20/18 91 lb (41.3 kg)  01/17/18 91 lb 9.6 oz (41.5 kg)  11/29/17 90 lb 8 oz (41.1 kg)     Review of Systems Denies vomiting, diarrhea, depression.  Past Medical History:  Diagnosis Date  . Anxiety   . Benign liver cyst   . GERD (gastroesophageal reflux disease)   . History of UTI     Past Surgical History:  Procedure Laterality Date  . CESAREAN SECTION     twice 2010, 2013  . Esure  ~2015    Social History   Socioeconomic History  . Marital status: Married    Spouse name: Not on file  . Number of children: Not on file  . Years of education: Not on file  . Highest education level: Not on file  Occupational History  . Occupation: stay home ; associate degree paralega   Social Needs  . Financial resource strain: Not on file  . Food insecurity:    Worry: Not on file    Inability: Not on file  . Transportation needs:    Medical: Not on file    Non-medical: Not on file  Tobacco Use  . Smoking status: Never Smoker  . Smokeless tobacco: Never Used  Substance and Sexual Activity  . Alcohol use: No    Comment: socially   . Drug use: No  . Sexual activity: Yes    Birth control/protection: None  Lifestyle  . Physical activity:    Days per week: Not on file    Minutes per session: Not on file  . Stress: Not on file  Relationships  . Social connections:    Talks on phone: Not on file    Gets together: Not on file    Attends religious service: Not on file    Active member of club or organization: Not on file    Attends meetings of clubs  or organizations: Not on file    Relationship status: Not on file  . Intimate partner violence:    Fear of current or ex partner: Not on file    Emotionally abused: Not on file    Physically abused: Not on file    Forced sexual activity: Not on file  Other Topics Concern  . Not on file  Social History Narrative   Household: pt, husband , 2 children   2 boys: 2010, 2013   P2G2   Original  from [place], no foreign trips      Allergies as of 01/20/2018      Reactions   Tetracyclines & Related Swelling   Swelling in spine; required spinal tap.   Erythromycin Other (See Comments)   unknown   Raspberry Rash   Sulfonamide Derivatives Rash      Medication List        Accurate as of 01/20/18 11:59 PM. Always use your most recent med list.          calcium carbonate 200 MG capsule Take 250 mg by mouth 2 (two) times daily with a meal.   cholecalciferol 1000 units tablet  Commonly known as:  VITAMIN D Take 2,000 Units by mouth daily.   iron polysaccharides 150 MG capsule Commonly known as:  FERREX 150 Take 1 capsule (150 mg total) by mouth 2 (two) times daily.   omeprazole 20 MG capsule Commonly known as:  PRILOSEC Take 1 capsule (20 mg total) by mouth 2 (two) times daily before a meal.   ondansetron 4 MG disintegrating tablet Commonly known as:  ZOFRAN ODT Take 1 tablet (4 mg total) by mouth every 8 (eight) hours as needed for nausea.          Objective:   Physical Exam BP 106/70 (BP Location: Right Arm, Patient Position: Sitting, Cuff Size: Small)   Pulse 87   Temp 97.7 F (36.5 C) (Oral)   Resp 16   Ht 5\' 3"  (1.6 m)   Wt 91 lb (41.3 kg)   LMP 01/01/2018   SpO2 99%   BMI 16.12 kg/m  General:   Well developed, underweight appearing but in no distress, she seems to be in very good spirits today.Marland Kitchen  HEENT:  Normocephalic . Face symmetric, atraumatic Lungs:  CTA B Normal respiratory effort, no intercostal retractions, no accessory muscle use. Heart: RRR,  no  murmur.  No pretibial edema bilaterally  MSK: Hands without lesions or evidence of self-induced vomiting Skin: Not pale. Not jaundice Neurologic:  alert & oriented X3.  Speech normal, gait appropriate for age and unassisted Psych--  Cognition and judgment appear intact.  Cooperative with normal attention span and concentration.  Behavior appropriate. No anxious or depressed appearing.      Assessment & Plan:    Assessment Underweight  Per pt:Wt when she finished high school : 120 pounds.  After her second pregnancy in 2013 her weight was 140 pounds. Since 2013 she started to lose weight steadily, the smallest she has been is 85 pounds. 08-2015: 91 pounds.  05-2016 90 pounds GERD Benign liver cyst H/o anxiety Gyn: Dr Rogue Bussing @ [facility]  GI:  -EGD 02/14/2014: Duodenum biopsy negative.  No Giardia, no sprue changes.  Stomach: Congested and erythematous on the prepyloric region, BX: no H. pylori, + reactive gastropathy. -Colonoscopy 02/14/2014: one polyp,Bx, Granulation tissue polyp -03/2014: Barium enema normal -Abnormal liver imaging: CT 01/2016, ultrasound 08-2016 >> finally got a MRI 08-2016 see report: 1. No biliary ductal dilatation. Common bile duct diameter 5 mm. No cholelithiasis. No choledocholithiasis. No biliary strictures.  2. 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 3. Caudate lobe hemangioma is mildly increased in size since 08/18/2013 MRI. 4. Atypical liver lesion in the posterior right liver lobe is stable in size since 08/18/2013 MRI, most consistent with a benign lesion such as a sclerosed hemangioma. 5. Otherwise unremarkable MRI abdomen. S/p ESURE 2015  Plan: Underweight: See OV 08/2017 regards my conversation w/ Jenne Campus RD;  Pt is trying to eat healthy but abundantly.  Her weight is holding.  No apparent anxiety or depression. Anemia, iron deficiency: Last labs were reassuring.  Continue with iron  supplements B12, vitamin D deficiency: Good compliance with supplementation according to the patient.  Recheck labs on RTC Preventive care: See Dr. Rogue Bussing for female care, Pap smear 10/07/2017 RTC 4 months

## 2018-01-20 NOTE — Patient Instructions (Signed)
  GO TO THE FRONT DESK Schedule your next appointment for a  Check up in 4 months   

## 2018-01-22 DIAGNOSIS — E539 Vitamin B deficiency, unspecified: Secondary | ICD-10-CM | POA: Insufficient documentation

## 2018-01-22 DIAGNOSIS — D509 Iron deficiency anemia, unspecified: Secondary | ICD-10-CM | POA: Insufficient documentation

## 2018-01-22 DIAGNOSIS — E559 Vitamin D deficiency, unspecified: Secondary | ICD-10-CM | POA: Insufficient documentation

## 2018-01-22 NOTE — Assessment & Plan Note (Signed)
Underweight: See OV 08/2017 regards my conversation w/ Jenne Campus RD;  Pt is trying to eat healthy but abundantly.  Her weight is holding.  No apparent anxiety or depression. Anemia, iron deficiency: Last labs were reassuring.  Continue with iron supplements B12, vitamin D deficiency: Good compliance with supplementation according to the patient.  Recheck labs on RTC Preventive care: See Dr. Rogue Bussing for female care, Pap smear 10/07/2017 RTC 4 months

## 2018-01-26 ENCOUNTER — Ambulatory Visit (INDEPENDENT_AMBULATORY_CARE_PROVIDER_SITE_OTHER): Payer: 59

## 2018-01-26 DIAGNOSIS — E539 Vitamin B deficiency, unspecified: Secondary | ICD-10-CM | POA: Diagnosis not present

## 2018-01-26 MED ORDER — CYANOCOBALAMIN 1000 MCG/ML IJ SOLN
1000.0000 ug | Freq: Once | INTRAMUSCULAR | Status: AC
  Administered 2018-01-26: 1000 ug via INTRAMUSCULAR

## 2018-01-26 NOTE — Progress Notes (Signed)
Pre visit review using our clinic tool,if applicable. No additional management support is needed unless otherwise documented below in the visit note.   Pt here for monthly B12 injection per order from Dr. Kathlene November.  B12 1026mcg given IM left deltoid and pt tolerated injection well.  No complaints voiced this visit.   Next B12 injection scheduled for February 28 2018 patient aware.

## 2018-02-07 ENCOUNTER — Telehealth: Payer: Self-pay | Admitting: Internal Medicine

## 2018-02-07 MED ORDER — ONDANSETRON 4 MG PO TBDP: 4.0000 mg | ORAL_TABLET | Freq: Three times a day (TID) | ORAL | 0 refills | Status: DC | PRN

## 2018-02-07 NOTE — Telephone Encounter (Signed)
Please advise 

## 2018-02-07 NOTE — Telephone Encounter (Signed)
Ok #10 tablets, no RF

## 2018-02-07 NOTE — Telephone Encounter (Signed)
Copied from [place] [id number]. Topic: General - Other >> Feb 07, 2018  7:55 AM Lennox Solders wrote: Reason for CRM:pt is calling and would like refill on ondansetron for nausea. This med was prescribed by her old  md. [facility]

## 2018-02-07 NOTE — Telephone Encounter (Signed)
Rx sent 

## 2018-02-14 ENCOUNTER — Ambulatory Visit: Payer: 59 | Admitting: Family Medicine

## 2018-02-18 ENCOUNTER — Other Ambulatory Visit: Payer: Self-pay | Admitting: Internal Medicine

## 2018-02-28 ENCOUNTER — Ambulatory Visit (INDEPENDENT_AMBULATORY_CARE_PROVIDER_SITE_OTHER): Payer: 59

## 2018-02-28 DIAGNOSIS — E539 Vitamin B deficiency, unspecified: Secondary | ICD-10-CM | POA: Diagnosis not present

## 2018-02-28 MED ORDER — CYANOCOBALAMIN 1000 MCG/ML IJ SOLN
1000.0000 ug | Freq: Once | INTRAMUSCULAR | Status: AC
  Administered 2018-02-28: 1000 ug via INTRAMUSCULAR

## 2018-02-28 NOTE — Progress Notes (Addendum)
Pre visit review using our clinic tool,if applicable. No additional management support is needed unless otherwise documented below in the visit note.  Pt here for monthly B12 injection per order from Dr. Kathlene November.  B12 1075mcg given IM Left deltoid, and pt tolerated injection well.  Next B12 injection scheduled for  04/11/18 patient aware.  Kathlene November, MD

## 2018-03-02 ENCOUNTER — Ambulatory Visit (INDEPENDENT_AMBULATORY_CARE_PROVIDER_SITE_OTHER): Payer: 59 | Admitting: Family Medicine

## 2018-03-02 ENCOUNTER — Encounter: Payer: Self-pay | Admitting: Family Medicine

## 2018-03-02 DIAGNOSIS — R634 Abnormal weight loss: Secondary | ICD-10-CM | POA: Diagnosis not present

## 2018-03-02 NOTE — Progress Notes (Signed)
Medical Nutrition Therapy:  Appt start time: 1000 end time:  1100. PCP   Kathlene November, MD  Assessment:  Primary concerns today: Unintentional Weight Loss (R63.4).  Stacy [name] has been more successful in heading off nausea by using Zofran at the first sign, avoiding the problem of not being able to eat.  This has happened only 1-2 X month, so has not been a contributor to reduced appetite.  She was surprised to see a weight loss (3 lb) b/c she thought she'd been eating well.  She did say she's been very busy this summer, and is possibly more physically active while her children are out of school.  She has not been able to keep up with food records, so it's difficult to accurately assess her usual intake.   Stacy [name] has been having strawberry milk ~2 X wk, using the Fairlife milk, which she has tolerated well.  She also has been drinking 1 c juice daily and soda 1-2 X wk.  Today she was drinking coconut water (only 60 kcal/8 oz), which she drinks 1-2 X wk.   Again, Stacy [name] could not identify what constitutes a carb food or a protein, nor could she state the target number of carb's or proteins per meal, which suggests she is not taking dietary recommendations seriously enough to effectively apply them.  For example, she assumed yesterday's breakfast was 4 carb's when actually 2 Hawaiian rolls = 2 carb's.  She became tearful as she insisted she has been trying to increase her intake and doesn't understand how she has lost weight.  I endorsed her assertion that this is very hard, and emphasized the importance of her focusing on meeting food needs as previously recommended, committing to memory the number of carb's and proteins per meal as well as how to determine what one carb or protein portion is.      24-hr recall:  (Up at 8 AM) B (8:15 AM)-  4 turk sausage links, 2 slc bread, 1 c cranberry juice Snk (10:30)-  1/2 large blueber muffin, 1 c strawberry (Fairlife) milk L (1 PM)-  1 8-in Ital sub, 1 c fries, 2 tbsp  ranch drsng, water Snk (4 PM)-  1 spinach-onion pie, water D (7 PM)-  3 oz salmon, 3 oz chx, 1 1/2 c rice w/ molokhia (spinach-like), 2 glasses wine Snk (8 PM)-  1 slc cake Typical day? Yes.    Recommended food plan:  Exchange CHO PRO FAT KCAL Starch 14 210 42 --- 1120 Protein 12 --- 77 33   660 Milk   1   12   8  ---     90 Fruit   2   30 --- ---   120 Veg   3   15   6  ---     75 Fat 12 --- --- 60   540 Totals  267    133 93 2915 Kcal             1068  532      837       (2437) %'s  44%     22%    34%  Progress Towards Goal(s):  In progress.   Nutritional Diagnosis:  Regression on [place]-3.1 Underweight As related to limited appetite.  As evidenced by weight loss of 3 lb since early June.    Intervention:  Nutrition education.  Handouts given during visit include:  AVS    Demonstrated degree of understanding via:  Teach Back   Monitoring/Evaluation:  Dietary intake and body weight in in 4 week(s).

## 2018-03-02 NOTE — Patient Instructions (Addendum)
-   Carb foods: Try to get mostly those that are NOT high-fat (e.g., fried).  When you get reflux or nausea, that makes it harder to eat.    - Get back to recording in [email or web address]:  Putting in ALL that you eat is not necessary in light of your very busy schedule.  Instead, enter both carb and protein foods and amounts, along with your assessment of how many carbs and protein they count as.    - EVERY CARB PORTION = 15 grams of carb.   - EVERY PROTEIN PORTION  = 7 grams of protein.    Your Exchange Goalsremain the same:  FoodBkfstLunchDinnerSnacks Carb foods3 4 43 Pro foods* 2 3 61 Milk food1 anytime during the day.   One last entry:  If you see you have some carb and protein exchanges missing at the end of the day, type those numbers into a message.    (The more detail you provide in your foods (brands), the better I can tell how accurate your exchange categorizing is.)  You will need to eat past discomfort sometimes (maybe many times).  Eating greater quantities will get easier, the more consistently you push your limits.  When you finish eating, ask yourself, "Can I take a couple more bites?"    Benefits of weight gain: - Look and feel healthy, strong, empowered, confident - Feel better about myself

## 2018-04-03 ENCOUNTER — Telehealth: Payer: Self-pay

## 2018-04-03 NOTE — Telephone Encounter (Signed)
Follow up call made to patient. She called [facility] over the weekend regarding severe diarrhea with vomiting and no fever. States she is going out of town tomorrow and that she feels much better now.

## 2018-04-11 ENCOUNTER — Ambulatory Visit: Payer: 59

## 2018-04-11 DIAGNOSIS — R112 Nausea with vomiting, unspecified: Secondary | ICD-10-CM | POA: Diagnosis not present

## 2018-04-11 DIAGNOSIS — R64 Cachexia: Secondary | ICD-10-CM | POA: Diagnosis not present

## 2018-04-11 DIAGNOSIS — R634 Abnormal weight loss: Secondary | ICD-10-CM | POA: Diagnosis not present

## 2018-04-11 DIAGNOSIS — E538 Deficiency of other specified B group vitamins: Secondary | ICD-10-CM | POA: Diagnosis not present

## 2018-04-11 DIAGNOSIS — R8290 Unspecified abnormal findings in urine: Secondary | ICD-10-CM | POA: Diagnosis not present

## 2018-04-11 DIAGNOSIS — R102 Pelvic and perineal pain: Secondary | ICD-10-CM | POA: Diagnosis not present

## 2018-04-13 ENCOUNTER — Ambulatory Visit: Payer: 59 | Admitting: Family Medicine

## 2018-04-14 DIAGNOSIS — R102 Pelvic and perineal pain: Secondary | ICD-10-CM | POA: Diagnosis not present

## 2018-04-14 DIAGNOSIS — R112 Nausea with vomiting, unspecified: Secondary | ICD-10-CM | POA: Diagnosis not present

## 2018-04-20 DIAGNOSIS — Z681 Body mass index (BMI) 19 or less, adult: Secondary | ICD-10-CM | POA: Diagnosis not present

## 2018-04-20 DIAGNOSIS — R1909 Other intra-abdominal and pelvic swelling, mass and lump: Secondary | ICD-10-CM | POA: Diagnosis not present

## 2018-04-25 DIAGNOSIS — R112 Nausea with vomiting, unspecified: Secondary | ICD-10-CM | POA: Diagnosis not present

## 2018-04-26 DIAGNOSIS — R1084 Generalized abdominal pain: Secondary | ICD-10-CM | POA: Diagnosis not present

## 2018-04-26 DIAGNOSIS — R197 Diarrhea, unspecified: Secondary | ICD-10-CM | POA: Diagnosis not present

## 2018-04-26 DIAGNOSIS — R112 Nausea with vomiting, unspecified: Secondary | ICD-10-CM | POA: Diagnosis not present

## 2018-04-27 DIAGNOSIS — D260 Other benign neoplasm of cervix uteri: Secondary | ICD-10-CM | POA: Diagnosis not present

## 2018-04-27 DIAGNOSIS — N879 Dysplasia of cervix uteri, unspecified: Secondary | ICD-10-CM | POA: Diagnosis not present

## 2018-04-27 DIAGNOSIS — N72 Inflammatory disease of cervix uteri: Secondary | ICD-10-CM | POA: Diagnosis not present

## 2018-04-27 DIAGNOSIS — Z01818 Encounter for other preprocedural examination: Secondary | ICD-10-CM | POA: Diagnosis not present

## 2018-04-28 DIAGNOSIS — K7689 Other specified diseases of liver: Secondary | ICD-10-CM | POA: Diagnosis not present

## 2018-04-28 DIAGNOSIS — R112 Nausea with vomiting, unspecified: Secondary | ICD-10-CM | POA: Diagnosis not present

## 2018-04-28 DIAGNOSIS — R1084 Generalized abdominal pain: Secondary | ICD-10-CM | POA: Diagnosis not present

## 2018-04-28 DIAGNOSIS — R197 Diarrhea, unspecified: Secondary | ICD-10-CM | POA: Diagnosis not present

## 2018-05-01 DIAGNOSIS — R948 Abnormal results of function studies of other organs and systems: Secondary | ICD-10-CM | POA: Diagnosis not present

## 2018-05-01 DIAGNOSIS — R0609 Other forms of dyspnea: Secondary | ICD-10-CM | POA: Diagnosis not present

## 2018-05-01 DIAGNOSIS — E559 Vitamin D deficiency, unspecified: Secondary | ICD-10-CM | POA: Diagnosis not present

## 2018-05-03 DIAGNOSIS — R197 Diarrhea, unspecified: Secondary | ICD-10-CM | POA: Diagnosis not present

## 2018-05-03 DIAGNOSIS — R112 Nausea with vomiting, unspecified: Secondary | ICD-10-CM | POA: Diagnosis not present

## 2018-05-03 DIAGNOSIS — R194 Change in bowel habit: Secondary | ICD-10-CM | POA: Diagnosis not present

## 2018-05-03 DIAGNOSIS — R509 Fever, unspecified: Secondary | ICD-10-CM | POA: Diagnosis not present

## 2018-05-03 DIAGNOSIS — D123 Benign neoplasm of transverse colon: Secondary | ICD-10-CM | POA: Diagnosis not present

## 2018-05-03 DIAGNOSIS — K2900 Acute gastritis without bleeding: Secondary | ICD-10-CM | POA: Diagnosis not present

## 2018-05-03 DIAGNOSIS — K635 Polyp of colon: Secondary | ICD-10-CM | POA: Diagnosis not present

## 2018-05-03 DIAGNOSIS — R8290 Unspecified abnormal findings in urine: Secondary | ICD-10-CM | POA: Diagnosis not present

## 2018-05-03 DIAGNOSIS — K2970 Gastritis, unspecified, without bleeding: Secondary | ICD-10-CM | POA: Diagnosis not present

## 2018-05-10 DIAGNOSIS — E538 Deficiency of other specified B group vitamins: Secondary | ICD-10-CM | POA: Diagnosis not present

## 2018-05-10 DIAGNOSIS — R10816 Epigastric abdominal tenderness: Secondary | ICD-10-CM | POA: Diagnosis not present

## 2018-05-16 DIAGNOSIS — Z4659 Encounter for fitting and adjustment of other gastrointestinal appliance and device: Secondary | ICD-10-CM | POA: Diagnosis not present

## 2018-05-16 DIAGNOSIS — R935 Abnormal findings on diagnostic imaging of other abdominal regions, including retroperitoneum: Secondary | ICD-10-CM | POA: Diagnosis not present

## 2018-05-16 DIAGNOSIS — R634 Abnormal weight loss: Secondary | ICD-10-CM | POA: Diagnosis not present

## 2018-05-16 DIAGNOSIS — K5669 Other partial intestinal obstruction: Secondary | ICD-10-CM | POA: Diagnosis not present

## 2018-05-16 DIAGNOSIS — K56609 Unspecified intestinal obstruction, unspecified as to partial versus complete obstruction: Secondary | ICD-10-CM | POA: Diagnosis not present

## 2018-05-16 DIAGNOSIS — R112 Nausea with vomiting, unspecified: Secondary | ICD-10-CM | POA: Diagnosis not present

## 2018-05-16 DIAGNOSIS — E43 Unspecified severe protein-calorie malnutrition: Secondary | ICD-10-CM | POA: Diagnosis not present

## 2018-05-16 DIAGNOSIS — D509 Iron deficiency anemia, unspecified: Secondary | ICD-10-CM | POA: Diagnosis not present

## 2018-05-16 DIAGNOSIS — K50012 Crohn's disease of small intestine with intestinal obstruction: Secondary | ICD-10-CM | POA: Diagnosis not present

## 2018-05-16 DIAGNOSIS — Z681 Body mass index (BMI) 19 or less, adult: Secondary | ICD-10-CM | POA: Diagnosis not present

## 2018-05-16 DIAGNOSIS — K56600 Partial intestinal obstruction, unspecified as to cause: Secondary | ICD-10-CM | POA: Insufficient documentation

## 2018-05-16 DIAGNOSIS — R0989 Other specified symptoms and signs involving the circulatory and respiratory systems: Secondary | ICD-10-CM | POA: Diagnosis not present

## 2018-05-16 DIAGNOSIS — R109 Unspecified abdominal pain: Secondary | ICD-10-CM | POA: Diagnosis not present

## 2018-05-16 DIAGNOSIS — R1011 Right upper quadrant pain: Secondary | ICD-10-CM | POA: Diagnosis not present

## 2018-05-16 DIAGNOSIS — R1084 Generalized abdominal pain: Secondary | ICD-10-CM | POA: Diagnosis not present

## 2018-05-23 ENCOUNTER — Ambulatory Visit: Payer: 59 | Admitting: Internal Medicine

## 2018-05-24 DIAGNOSIS — K5901 Slow transit constipation: Secondary | ICD-10-CM | POA: Diagnosis not present

## 2018-05-24 DIAGNOSIS — K219 Gastro-esophageal reflux disease without esophagitis: Secondary | ICD-10-CM | POA: Diagnosis not present

## 2018-05-24 DIAGNOSIS — K598 Other specified functional intestinal disorders: Secondary | ICD-10-CM | POA: Diagnosis not present

## 2018-05-31 DIAGNOSIS — K598 Other specified functional intestinal disorders: Secondary | ICD-10-CM | POA: Diagnosis not present

## 2018-06-08 DIAGNOSIS — K529 Noninfective gastroenteritis and colitis, unspecified: Secondary | ICD-10-CM | POA: Diagnosis not present

## 2018-06-10 ENCOUNTER — Other Ambulatory Visit: Payer: Self-pay | Admitting: Internal Medicine

## 2018-06-12 DIAGNOSIS — M810 Age-related osteoporosis without current pathological fracture: Secondary | ICD-10-CM | POA: Diagnosis not present

## 2018-06-12 DIAGNOSIS — E538 Deficiency of other specified B group vitamins: Secondary | ICD-10-CM | POA: Diagnosis not present

## 2018-06-12 DIAGNOSIS — R112 Nausea with vomiting, unspecified: Secondary | ICD-10-CM | POA: Diagnosis not present

## 2018-06-12 DIAGNOSIS — N911 Secondary amenorrhea: Secondary | ICD-10-CM | POA: Diagnosis not present

## 2018-06-12 DIAGNOSIS — R300 Dysuria: Secondary | ICD-10-CM | POA: Diagnosis not present

## 2018-06-13 DIAGNOSIS — Z1889 Other specified retained foreign body fragments: Secondary | ICD-10-CM | POA: Diagnosis not present

## 2018-06-13 DIAGNOSIS — K598 Other specified functional intestinal disorders: Secondary | ICD-10-CM | POA: Diagnosis not present

## 2018-06-13 DIAGNOSIS — K5901 Slow transit constipation: Secondary | ICD-10-CM | POA: Diagnosis not present

## 2018-06-13 DIAGNOSIS — K219 Gastro-esophageal reflux disease without esophagitis: Secondary | ICD-10-CM | POA: Diagnosis not present

## 2018-06-15 DIAGNOSIS — K598 Other specified functional intestinal disorders: Secondary | ICD-10-CM | POA: Diagnosis not present

## 2018-06-22 DIAGNOSIS — Z681 Body mass index (BMI) 19 or less, adult: Secondary | ICD-10-CM | POA: Diagnosis not present

## 2018-06-22 DIAGNOSIS — N911 Secondary amenorrhea: Secondary | ICD-10-CM | POA: Diagnosis not present

## 2018-06-25 DIAGNOSIS — I959 Hypotension, unspecified: Secondary | ICD-10-CM | POA: Diagnosis not present

## 2018-06-25 DIAGNOSIS — R55 Syncope and collapse: Secondary | ICD-10-CM | POA: Diagnosis not present

## 2018-06-25 DIAGNOSIS — R1084 Generalized abdominal pain: Secondary | ICD-10-CM | POA: Diagnosis not present

## 2018-06-25 DIAGNOSIS — E860 Dehydration: Secondary | ICD-10-CM | POA: Diagnosis not present

## 2018-06-25 DIAGNOSIS — R4020 Unspecified coma: Secondary | ICD-10-CM | POA: Diagnosis not present

## 2018-06-25 DIAGNOSIS — R1110 Vomiting, unspecified: Secondary | ICD-10-CM | POA: Diagnosis not present

## 2018-06-25 DIAGNOSIS — K5900 Constipation, unspecified: Secondary | ICD-10-CM | POA: Diagnosis not present

## 2018-06-25 DIAGNOSIS — K3189 Other diseases of stomach and duodenum: Secondary | ICD-10-CM | POA: Diagnosis not present

## 2018-07-10 DIAGNOSIS — E538 Deficiency of other specified B group vitamins: Secondary | ICD-10-CM | POA: Diagnosis not present

## 2018-07-28 DIAGNOSIS — K598 Other specified functional intestinal disorders: Secondary | ICD-10-CM | POA: Diagnosis not present

## 2018-07-28 DIAGNOSIS — R635 Abnormal weight gain: Secondary | ICD-10-CM | POA: Diagnosis not present

## 2018-07-28 DIAGNOSIS — R194 Change in bowel habit: Secondary | ICD-10-CM | POA: Diagnosis not present

## 2018-07-28 LAB — HEPATIC FUNCTION PANEL
ALT: 5 — AB (ref 7–35)
AST: 7 — AB (ref 13–35)
Alkaline Phosphatase: 69 (ref 25–125)
BILIRUBIN, TOTAL: 0.5

## 2018-07-28 LAB — BASIC METABOLIC PANEL
BUN: 18 (ref 4–21)
Creatinine: 0.5 (ref 0.5–1.1)
Glucose: 84
Potassium: 4.1 (ref 3.4–5.3)
Sodium: 138 (ref 137–147)

## 2018-07-28 LAB — CBC AND DIFFERENTIAL
HEMATOCRIT: 39 (ref 36–46)
Hemoglobin: 13.1 (ref 12.0–16.0)
Neutrophils Absolute: 4
PLATELETS: 317 (ref 150–399)
WBC: 7.6

## 2018-07-28 LAB — TSH: TSH: 0.83 (ref 0.41–5.90)

## 2018-08-07 ENCOUNTER — Other Ambulatory Visit: Payer: Self-pay

## 2018-08-07 ENCOUNTER — Ambulatory Visit (INDEPENDENT_AMBULATORY_CARE_PROVIDER_SITE_OTHER): Payer: 59 | Admitting: Physician Assistant

## 2018-08-07 ENCOUNTER — Encounter: Payer: Self-pay | Admitting: Physician Assistant

## 2018-08-07 VITALS — BP 110/72 | HR 98 | Temp 97.8°F | Resp 18 | Ht 63.0 in | Wt 92.0 lb

## 2018-08-07 DIAGNOSIS — J0100 Acute maxillary sinusitis, unspecified: Secondary | ICD-10-CM | POA: Diagnosis not present

## 2018-08-07 LAB — POCT INFLUENZA A/B
INFLUENZA B, POC: NEGATIVE
Influenza A, POC: NEGATIVE

## 2018-08-07 MED ORDER — AMOXICILLIN 875 MG PO TABS: 875.0000 mg | ORAL_TABLET | Freq: Two times a day (BID) | ORAL | 0 refills | Status: DC

## 2018-08-07 MED ORDER — ONDANSETRON HCL 4 MG PO TABS: 4.0000 mg | ORAL_TABLET | Freq: Three times a day (TID) | ORAL | 0 refills | Status: DC | PRN

## 2018-08-07 NOTE — Patient Instructions (Signed)
We are sorry that you are not feeling well.  Here is how we plan to help!  Based on what you have shared with me it looks like you have sinusitis.  Sinusitis is inflammation and infection in the sinus cavities of the head.  Based on your presentation I believe you most likely have Acute Viral Sinusitis.This is an infection most likely caused by a virus. There is not specific treatment for viral sinusitis other than to help you with the symptoms until the infection runs its course.  You may use an oral decongestant such as Mucinex D or if you have glaucoma or high blood pressure use plain Mucinex. Saline nasal spray help and can safely be used as often as needed for congestion.  I have prescribed zofran to use as directed for nausea from all of the drainage! Some authorities believe that zinc sprays or the use of Echinacea may shorten the course of your symptoms. Giving your history of bacterial sinus infections, I have sent in an antibiotic for you to start only if symptoms are not leveling off/easing up in the next 48-72 hours.   Sinus infections are not as easily transmitted as other respiratory infection, however we still recommend that you avoid close contact with loved ones, especially the very young and elderly.  Remember to wash your hands thoroughly throughout the day as this is the number one way to prevent the spread of infection!  Home Care:  Only take medications as instructed by your medical team.  Do not take these medications with alcohol.  A steam or ultrasonic humidifier can help congestion.  You can place a towel over your head and breathe in the steam from hot water coming from a faucet.  Avoid close contacts especially the very young and the elderly.  Cover your mouth when you cough or sneeze.  Always remember to wash your hands.  Get Help Right Away If:  You develop worsening fever or sinus pain.  You develop a severe head ache or visual changes.  Your symptoms  persist after you have completed your treatment plan.  Make sure you  Understand these instructions.  Will watch your condition.  Will get help right away if you are not doing well or get worse.  Your e-visit answers were reviewed by a board certified advanced clinical practitioner to complete your personal care plan.  Depending on the condition, your plan could have included both over the counter or prescription medications.  If there is a problem please reply  once you have received a response from your provider.  Your safety is important to [place].  If you have drug allergies check your prescription carefully.    You can use MyChart to ask questions about today's visit, request a non-urgent call back, or ask for a work or school excuse for 24 hours related to this e-Visit. If it has been greater than 24 hours you will need to follow up with your provider, or enter a new e-Visit to address those concerns.  You will get an e-mail in the next two days asking about your experience.  I hope that your e-visit has been valuable and will speed your recovery. Thank you for using e-visits.

## 2018-08-07 NOTE — Progress Notes (Signed)
Patient presents to clinic today c/o 2 days of nasal congestion with significant PND causing an episode of dry heaves and non-bloody emesis. Denies change to bowel or bladder habits. Notes sinus pressure and headache. Denies sore throat, chest congestion or chest pain. Noting mild, dry cough. Children have been sick recently. Notes substantial history of sinus infection that always ends up worsening and needing antibiotic. Denies fever but notes chills.  Past Medical History:  Diagnosis Date  . Anxiety   . Benign liver cyst   . GERD (gastroesophageal reflux disease)   . History of UTI     Current Outpatient Medications on File Prior to Visit  Medication Sig Dispense Refill  . calcium carbonate 200 MG capsule Take 250 mg by mouth 2 (two) times daily with a meal.    . cholecalciferol (VITAMIN D) 1000 units tablet Take 2,000 Units by mouth daily.     . iron polysaccharides (FERREX 150) 150 MG capsule Take 1 capsule (150 mg total) by mouth 2 (two) times daily. 60 capsule 5  . omeprazole (PRILOSEC) 20 MG capsule Take 1 capsule (20 mg total) by mouth 2 (two) times daily before a meal. 180 capsule 3  . ondansetron (ZOFRAN ODT) 4 MG disintegrating tablet Take 1 tablet (4 mg total) by mouth every 8 (eight) hours as needed for nausea or vomiting. 10 tablet 0   Current Facility-Administered Medications on File Prior to Visit  Medication Dose Route Frequency Provider Last Rate Last Dose  . cyanocobalamin ((VITAMIN B-12)) injection 1,000 mcg  1,000 mcg Intramuscular Once Colon Branch, MD        Allergies  Allergen Reactions  . Tetracyclines & Related Swelling    Swelling in spine; required spinal tap.   . Erythromycin Other (See Comments)    unknown  . Raspberry Rash  . Sulfonamide Derivatives Rash    Family History  Problem Relation Age of Onset  . CAD Father   . Hypertension Father   . Diabetes Father   . Diabetes Brother   . Colon cancer Neg Hx   . Breast cancer Neg Hx     Social  History   Socioeconomic History  . Marital status: Married    Spouse name: Not on file  . Number of children: Not on file  . Years of education: Not on file  . Highest education level: Not on file  Occupational History  . Occupation: stay home ; associate degree paralega   Social Needs  . Financial resource strain: Not on file  . Food insecurity:    Worry: Not on file    Inability: Not on file  . Transportation needs:    Medical: Not on file    Non-medical: Not on file  Tobacco Use  . Smoking status: Never Smoker  . Smokeless tobacco: Never Used  Substance and Sexual Activity  . Alcohol use: No    Comment: socially   . Drug use: No  . Sexual activity: Yes    Birth control/protection: None  Lifestyle  . Physical activity:    Days per week: Not on file    Minutes per session: Not on file  . Stress: Not on file  Relationships  . Social connections:    Talks on phone: Not on file    Gets together: Not on file    Attends religious service: Not on file    Active member of club or organization: Not on file    Attends meetings of clubs or organizations:  Not on file    Relationship status: Not on file  Other Topics Concern  . Not on file  Social History Narrative   Household: pt, husband , 2 children   2 boys: 2010, 2013   P2G2   Original  from [place], no foreign trips   Review of Systems - See HPI.  All other ROS are negative.  BP 110/72   Pulse 98   Temp 97.8 F (36.6 C) (Oral)   Resp 18   Ht 5\' 3"  (1.6 m)   Wt 92 lb (41.7 kg)   SpO2 99%   BMI 16.30 kg/m   Physical Exam Constitutional:      Appearance: Normal appearance.  HENT:     Head: Normocephalic and atraumatic.     Right Ear: Tympanic membrane normal.     Left Ear: Tympanic membrane normal.     Nose: Congestion present.     Mouth/Throat:     Mouth: Mucous membranes are moist.  Eyes:     Conjunctiva/sclera: Conjunctivae normal.  Neck:     Musculoskeletal: Neck supple.  Cardiovascular:      Rate and Rhythm: Normal rate and regular rhythm.     Pulses: Normal pulses.     Heart sounds: Normal heart sounds.  Pulmonary:     Effort: Pulmonary effort is normal.     Breath sounds: Normal breath sounds.  Neurological:     Mental Status: She is alert.  Psychiatric:        Mood and Affect: Mood normal.    Assessment/Plan: 1. Acute non-recurrent maxillary sinusitis Flu swab obtained due to abrupt onset of symptoms. Negative. Afebrile. Vitals stable. Start Zofran for nausea. Supportive measures and OTC medications reviewed with patient and husband. Patient adamant that she always ends up with bacterial sinusitis requiring abx. Rx written for amox to take if symptoms not easing up over the next 48-72 hours. .   - ondansetron (ZOFRAN) 4 MG tablet; Take 1 tablet (4 mg total) by mouth every 8 (eight) hours as needed for nausea or vomiting.  Dispense: 20 tablet; Refill: 0 - amoxicillin (AMOXIL) 875 MG tablet; Take 1 tablet (875 mg total) by mouth 2 (two) times daily.  Dispense: 20 tablet; Refill: 0 - POCT Influenza A/B   Leeanne Rio, PA-C

## 2018-09-05 ENCOUNTER — Ambulatory Visit (INDEPENDENT_AMBULATORY_CARE_PROVIDER_SITE_OTHER): Payer: 59 | Admitting: Internal Medicine

## 2018-09-05 ENCOUNTER — Encounter: Payer: Self-pay | Admitting: Internal Medicine

## 2018-09-05 VITALS — BP 116/76 | HR 82 | Temp 98.0°F | Resp 16 | Ht 63.0 in | Wt 93.0 lb

## 2018-09-05 DIAGNOSIS — K598 Other specified functional intestinal disorders: Secondary | ICD-10-CM | POA: Diagnosis not present

## 2018-09-05 DIAGNOSIS — E539 Vitamin B deficiency, unspecified: Secondary | ICD-10-CM

## 2018-09-05 DIAGNOSIS — R636 Underweight: Secondary | ICD-10-CM

## 2018-09-05 DIAGNOSIS — K5989 Other specified functional intestinal disorders: Secondary | ICD-10-CM | POA: Insufficient documentation

## 2018-09-05 MED ORDER — CYANOCOBALAMIN 1000 MCG/ML IJ SOLN
1000.0000 ug | Freq: Once | INTRAMUSCULAR | Status: AC
  Administered 2018-09-05: 1000 ug via INTRAMUSCULAR

## 2018-09-05 NOTE — Assessment & Plan Note (Signed)
Chronic intestinal pseudo- obstruction: Patient got very sick last year, was admitted to hospital in [place], weight was as low as 77 pounds.  After extensive evaluation was diagnosed with chronic intestinal  pseudo obstruction which account for her nausea and weight loss. She was treated also for a intestinal bacterial overgrowth. Current treatment is MiraLAX, Zofran which she uses very rarely. She is eating a great variety of foods, gaining weight per her own scales. I review labs from [facility], they will be scanned.  Last CMP and CBC December 2018 were fine. Underweight: See above, improving Vitamin B12, vitamin D deficiency: Continue B12 shots monthly until next visit, consider switch to oral supplements then. RTC 6 months

## 2018-09-05 NOTE — Progress Notes (Signed)
Subjective:    Patient ID: Stacy [name], female    DOB: 04/16/1981, 38 y.o.   MRN: [id number]  DOS:  09/05/2018 Type of visit - description: rov, here with her husband Last year she got very sick, was admitted to a hospital in [place], she had an extensive evaluation, her weight went down to 77 pounds. At the end she was diagnosed with chronic intestinal pseudo-obstruction. Since then she is doing much better.  Wt Readings from Last 3 Encounters:  09/05/18 93 lb (42.2 kg)  08/07/18 92 lb (41.7 kg)  03/02/18 88 lb 6.4 oz (40.1 kg)     Review of Systems Emotionally doing great Nausea has significantly decreased.  No vomiting. She is gaining weight consistently for the last few months  Past Medical History:  Diagnosis Date  . Anxiety   . Benign liver cyst   . GERD (gastroesophageal reflux disease)   . History of UTI     Past Surgical History:  Procedure Laterality Date  . CESAREAN SECTION     twice 2010, 2013  . Esure  ~2015    Social History   Socioeconomic History  . Marital status: Married    Spouse name: Not on file  . Number of children: Not on file  . Years of education: Not on file  . Highest education level: Not on file  Occupational History  . Occupation: stay home ; associate degree paralega   Social Needs  . Financial resource strain: Not on file  . Food insecurity:    Worry: Not on file    Inability: Not on file  . Transportation needs:    Medical: Not on file    Non-medical: Not on file  Tobacco Use  . Smoking status: Never Smoker  . Smokeless tobacco: Never Used  Substance and Sexual Activity  . Alcohol use: No    Comment: socially   . Drug use: No  . Sexual activity: Yes    Birth control/protection: None  Lifestyle  . Physical activity:    Days per week: Not on file    Minutes per session: Not on file  . Stress: Not on file  Relationships  . Social connections:    Talks on phone: Not on file    Gets together: Not on file   Attends religious service: Not on file    Active member of club or organization: Not on file    Attends meetings of clubs or organizations: Not on file    Relationship status: Not on file  . Intimate partner violence:    Fear of current or ex partner: Not on file    Emotionally abused: Not on file    Physically abused: Not on file    Forced sexual activity: Not on file  Other Topics Concern  . Not on file  Social History Narrative   Household: pt, husband , 2 children   2 boys: 2010, 2013   P2G2   Original  from [place], no foreign trips      Allergies as of 09/05/2018      Reactions   Tetracyclines & Related Swelling   Swelling in spine; required spinal tap.   Erythromycin Other (See Comments)   unknown   Raspberry Rash   Sulfonamide Derivatives Rash      Medication List       Accurate as of September 05, 2018  9:37 AM. Always use your most recent med list.        amoxicillin 875  MG tablet Commonly known as:  AMOXIL Take 1 tablet (875 mg total) by mouth 2 (two) times daily.   calcium carbonate 200 MG capsule Take 250 mg by mouth 2 (two) times daily with a meal.   cholecalciferol 1000 units tablet Commonly known as:  VITAMIN D Take 2,000 Units by mouth daily.   iron polysaccharides 150 MG capsule Commonly known as:  FERREX 150 Take 1 capsule (150 mg total) by mouth 2 (two) times daily.   omeprazole 20 MG capsule Commonly known as:  PRILOSEC Take 1 capsule (20 mg total) by mouth 2 (two) times daily before a meal.   ondansetron 4 MG disintegrating tablet Commonly known as:  ZOFRAN ODT Take 1 tablet (4 mg total) by mouth every 8 (eight) hours as needed for nausea or vomiting.   ondansetron 4 MG tablet Commonly known as:  ZOFRAN Take 1 tablet (4 mg total) by mouth every 8 (eight) hours as needed for nausea or vomiting.   polyethylene glycol packet Commonly known as:  MIRALAX / GLYCOLAX Take 17 g by mouth 2 (two) times daily.           Objective:    Physical Exam BP 116/76 (BP Location: Left Arm, Patient Position: Sitting, Cuff Size: Small)   Pulse 82   Temp 98 F (36.7 C) (Oral)   Resp 16   Ht 5\' 3"  (1.6 m)   Wt 93 lb (42.2 kg)   LMP 01/14/2018 (Approximate)   SpO2 97%   BMI 16.47 kg/m  General:   Well developed, NAD, BMI noted.  HEENT:  Normocephalic . Face symmetric, atraumatic Lungs:  CTA B Normal respiratory effort, no intercostal retractions, no accessory muscle use. Heart: RRR,  no murmur.  no pretibial edema bilaterally  Abdomen:  Not distended, soft, non-tender. No rebound or rigidity.   Skin: Not pale. Not jaundice Neurologic:  alert & oriented X3.  Speech normal, gait appropriate for age and unassisted Psych--  Cognition and judgment appear intact.  Cooperative with normal attention span and concentration.  Behavior appropriate. No anxious or depressed appearing.     Assessment      Assessment Underweight  Per pt:Wt when she finished high school : 120 pounds.  After her second pregnancy in 2013 her weight was 140 pounds. Since 2013 she started to lose weight steadily, the smallest she has been is 85 pounds. 08-2015: 91 pounds.  05-2016 90 pounds Chronic intestinal pseudo-obstruction DX 05-2018 in [place].  Felt to be the cause of weight loss. GERD Benign liver cyst  H/o anxiety Gyn: Dr Rogue Bussing @ [facility]  GI:  -EGD 02/14/2014: Duodenum biopsy negative.  No Giardia, no sprue changes.  Stomach: Congested and erythematous on the prepyloric region, BX: no H. pylori, + reactive gastropathy. -Colonoscopy 02/14/2014: one polyp,Bx, Granulation tissue polyp -03/2014: Barium enema normal -Abnormal liver imaging: CT 01/2016, ultrasound 08-2016 >> finally got a MRI 08-2016 see report: 1. No biliary ductal dilatation. Common bile duct diameter 5 mm. No cholelithiasis. No choledocholithiasis. No biliary strictures.  2. Focal nodular hyperplasia (FNH) in the inferior left liver lobe is decreased in size since  08/18/2013 MRI. 3. Caudate lobe hemangioma is mildly increased in size since 08/18/2013 MRI. 4. Atypical liver lesion in the posterior right liver lobe is stable in size since 08/18/2013 MRI, most consistent with a benign lesion such as a sclerosed hemangioma. 5. Otherwise unremarkable MRI abdomen. S/p ESURE 2015  Plan: Chronic intestinal pseudo- obstruction: Patient got very sick last year, was admitted  to hospital in [place], weight was as low as 77 pounds.  After extensive evaluation was diagnosed with chronic intestinal  pseudo obstruction which account for her nausea and weight loss. She was treated also for a intestinal bacterial overgrowth. Current treatment is MiraLAX, Zofran which she uses very rarely. She is eating a great variety of foods, gaining weight per her own scales. I review labs from [place], they will be scanned.  Last CMP and CBC December 2018 were fine. Underweight: See above, improving Vitamin B12, vitamin D deficiency: Continue B12 shots monthly until next visit, consider switch to oral supplements then. RTC 6 months

## 2018-09-05 NOTE — Patient Instructions (Signed)
  GO TO THE FRONT DESK Schedule your next appointment      

## 2018-09-05 NOTE — Progress Notes (Signed)
Pre visit review using our clinic review tool, if applicable. No additional management support is needed unless otherwise documented below in the visit note. 

## 2018-09-08 DIAGNOSIS — Z0100 Encounter for examination of eyes and vision without abnormal findings: Secondary | ICD-10-CM | POA: Diagnosis not present

## 2018-09-15 DIAGNOSIS — K5900 Constipation, unspecified: Secondary | ICD-10-CM | POA: Diagnosis not present

## 2018-09-26 ENCOUNTER — Other Ambulatory Visit: Payer: Self-pay

## 2018-09-26 DIAGNOSIS — R350 Frequency of micturition: Secondary | ICD-10-CM | POA: Diagnosis not present

## 2018-09-26 MED ORDER — OMEPRAZOLE 20 MG PO CPDR: 20.0000 mg | DELAYED_RELEASE_CAPSULE | Freq: Two times a day (BID) | ORAL | 3 refills | Status: DC

## 2018-09-27 ENCOUNTER — Other Ambulatory Visit: Payer: Self-pay

## 2018-09-27 MED ORDER — VITAMIN D (CHOLECALCIFEROL) 25 MCG (1000 UT) PO TABS: 2000.0000 [IU] | ORAL_TABLET | Freq: Every day | ORAL | 3 refills | Status: DC

## 2018-10-06 ENCOUNTER — Ambulatory Visit: Payer: 59

## 2018-10-10 ENCOUNTER — Ambulatory Visit (INDEPENDENT_AMBULATORY_CARE_PROVIDER_SITE_OTHER): Payer: 59 | Admitting: *Deleted

## 2018-10-10 DIAGNOSIS — E539 Vitamin B deficiency, unspecified: Secondary | ICD-10-CM

## 2018-10-10 MED ORDER — CYANOCOBALAMIN 1000 MCG/ML IJ SOLN
1000.0000 ug | Freq: Once | INTRAMUSCULAR | Status: AC
  Administered 2018-10-10: 1000 ug via INTRAMUSCULAR

## 2018-10-10 NOTE — Progress Notes (Signed)
Pt here for monthly B12 injection per order from Dr. Kathlene November.  Last injection was 09/05/18.  B12 1067mcg given IM Left deltoid, and pt tolerated injection well.   Next B12 injection scheduled for 11/03/18 patient aware.

## 2018-10-20 DIAGNOSIS — Z01419 Encounter for gynecological examination (general) (routine) without abnormal findings: Secondary | ICD-10-CM | POA: Diagnosis not present

## 2018-11-03 ENCOUNTER — Ambulatory Visit: Payer: 59

## 2018-11-18 ENCOUNTER — Other Ambulatory Visit: Payer: Self-pay | Admitting: Internal Medicine

## 2018-12-08 ENCOUNTER — Ambulatory Visit: Payer: 59

## 2018-12-18 ENCOUNTER — Telehealth: Payer: Self-pay | Admitting: Internal Medicine

## 2018-12-18 MED ORDER — OMEPRAZOLE 40 MG PO CPDR: 40.0000 mg | DELAYED_RELEASE_CAPSULE | Freq: Two times a day (BID) | ORAL | 1 refills | Status: DC

## 2018-12-18 NOTE — Telephone Encounter (Signed)
Rx sent 

## 2018-12-18 NOTE — Telephone Encounter (Signed)
Spoke with the patient, symptoms are well controlled with current medication thus okay to change to omeprazole 40 mg 1 tablet twice daily, send a 63-month supply.

## 2018-12-18 NOTE — Telephone Encounter (Signed)
Please advise 

## 2018-12-18 NOTE — Telephone Encounter (Signed)
Copied from [place] [phone]. Topic: Quick Communication - Rx Refill/Question >> Dec 18, 2018  9:16 AM Burchel, Abbi R wrote: Medication: omeprazole (PRILOSEC) 20 MG capsule  Pt states she is currently taking 2 capsules in the AM and 2 in the PM, she would like to know if she can get these in 40mg  capsules.Please advise.   Preferred Pharmacy: [id number] - [place], [place] - [address]  [phone] (Phone) [phone] (Fax)   Pt: [phone]

## 2019-01-12 ENCOUNTER — Other Ambulatory Visit: Payer: Self-pay

## 2019-01-12 ENCOUNTER — Ambulatory Visit (INDEPENDENT_AMBULATORY_CARE_PROVIDER_SITE_OTHER): Payer: 59

## 2019-01-12 DIAGNOSIS — E538 Deficiency of other specified B group vitamins: Secondary | ICD-10-CM

## 2019-01-12 MED ORDER — CYANOCOBALAMIN 1000 MCG/ML IJ SOLN
1000.0000 ug | Freq: Once | INTRAMUSCULAR | Status: AC
  Administered 2019-01-12: 09:00:00 1000 ug via INTRAMUSCULAR

## 2019-01-12 NOTE — Progress Notes (Addendum)
Pt here for monthly B12 injection per  Dr Larose Kells.   B12 1084mcg given IM in left deltoid, and pt tolerated injection well.  Next B12 injection scheduled for 02/09/2019.  Kathlene November, MD

## 2019-01-15 LAB — BASIC METABOLIC PANEL
BUN: 15 (ref 4–21)
Creatinine: 0.5 (ref 0.5–1.1)
Glucose: 91
Potassium: 4.1 (ref 3.4–5.3)
Sodium: 137 (ref 137–147)

## 2019-01-15 LAB — VITAMIN B12: Vitamin B-12: 505

## 2019-01-26 ENCOUNTER — Encounter (HOSPITAL_COMMUNITY): Payer: Self-pay | Admitting: Emergency Medicine

## 2019-01-26 ENCOUNTER — Emergency Department (HOSPITAL_COMMUNITY)
Admission: EM | Admit: 2019-01-26 | Discharge: 2019-01-27 | Disposition: A | Discharge status: Home / Self Care | Payer: 59 | Attending: Emergency Medicine | Admitting: Emergency Medicine

## 2019-01-26 ENCOUNTER — Encounter: Payer: Self-pay | Admitting: Internal Medicine

## 2019-01-26 DIAGNOSIS — R112 Nausea with vomiting, unspecified: Secondary | ICD-10-CM | POA: Diagnosis not present

## 2019-01-26 DIAGNOSIS — R1084 Generalized abdominal pain: Secondary | ICD-10-CM | POA: Insufficient documentation

## 2019-01-26 DIAGNOSIS — Z79899 Other long term (current) drug therapy: Secondary | ICD-10-CM | POA: Diagnosis not present

## 2019-01-26 HISTORY — DX: Other specified functional intestinal disorders: K59.89

## 2019-01-26 LAB — CBC
HCT: 36 % (ref 36.0–46.0)
Hemoglobin: 12 g/dL (ref 12.0–15.0)
MCH: 29.8 pg (ref 26.0–34.0)
MCHC: 33.3 g/dL (ref 30.0–36.0)
MCV: 89.3 fL (ref 80.0–100.0)
Platelets: 290 10*3/uL (ref 150–400)
RBC: 4.03 MIL/uL (ref 3.87–5.11)
RDW: 12.2 % (ref 11.5–15.5)
WBC: 10.7 10*3/uL — ABNORMAL HIGH (ref 4.0–10.5)
nRBC: 0 % (ref 0.0–0.2)

## 2019-01-26 LAB — I-STAT BETA HCG BLOOD, ED (MC, WL, AP ONLY): I-stat hCG, quantitative: <5 m[IU]/mL (ref <5)

## 2019-01-26 MED ORDER — SODIUM CHLORIDE 0.9% FLUSH: 3.0000 mL | Freq: Once | INTRAVENOUS | Status: DC

## 2019-01-26 MED ORDER — ONDANSETRON HCL 4 MG/2ML IJ SOLN
4.0000 mg | Freq: Once | INTRAMUSCULAR | Status: AC
  Administered 2019-01-27: 4 mg via INTRAVENOUS
  Filled 2019-01-26: qty 2

## 2019-01-26 MED ORDER — KETOROLAC TROMETHAMINE 30 MG/ML IJ SOLN
30.0000 mg | Freq: Once | INTRAMUSCULAR | Status: AC
  Administered 2019-01-27: 30 mg via INTRAVENOUS
  Filled 2019-01-26: qty 1

## 2019-01-26 NOTE — ED Notes (Signed)
Dr Stark Jock @ bedside

## 2019-01-26 NOTE — ED Provider Notes (Signed)
[place] EMERGENCY DEPARTMENT Provider Note   CSN: [id number] Arrival date & time: 01/26/19  2301     History   Chief Complaint Chief Complaint  Patient presents with  . Abdominal Pain    HPI Stacy [name] is a 38 y.o. female.     Patient is a 38 year old female with past medical history of what she describes as chronic intestinal pseudoobstruction.  She sees Dr. Michail Sermon in the GI clinic and has another specialist she is waiting to see at [facility].  Patient has episodic abdominal pain and vomiting spells.  She began earlier today with abdominal cramping, nausea, then vomiting.  She describes worsening cramping throughout her abdomen and has been unable to keep anything down.  She does report having bowel movements, most recently within the hour.  She denies any urinary complaints.  She denies any fevers, chills, or ill contacts.  The history is provided by the patient.  Abdominal Pain Pain location:  Generalized Pain quality: cramping   Pain radiates to:  Does not radiate Pain severity:  Severe Onset quality:  Sudden Timing:  Constant Progression:  Worsening Chronicity:  Recurrent Relieved by:  Nothing Worsened by:  Movement and palpation Associated symptoms: no constipation, no fever, no hematemesis and no hematochezia     Past Medical History:  Diagnosis Date  . Anxiety   . Benign liver cyst   . Chronic intestinal pseudo-obstruction   . GERD (gastroesophageal reflux disease)   . History of UTI     Patient Active Problem List   Diagnosis Date Noted  . 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 Iron deficiency anemia 01/22/2018  . Vitamin B deficiency 01/22/2018  . Vitamin D deficiency 01/22/2018  . PCP NOTES >>>>>>>>>>>>>>>>>>>>>>>>>>>>> 06/28/2017  . Underweight 06/27/2017  . Cyst of ovary 10/05/2016  . Hypotension, unspecified 07/27/2013  . Hyperglycemia 07/27/2013  . Hepatic adenoma 07/27/2013    Past Surgical History:   Procedure Laterality Date  . CESAREAN SECTION     twice 2010, 2013  . Esure  ~2015     OB History    Gravida  2   Para  2   Term  1   Preterm      AB      Living  2     SAB      TAB      Ectopic      Multiple      Live Births  1            Home Medications    Prior to Admission medications   Medication Sig Start Date End Date Taking? Authorizing Provider  calcium carbonate 200 MG capsule Take 250 mg by mouth 2 (two) times daily with a meal.    [provider]  iron polysaccharides (FERREX 150) 150 MG capsule Take 1 capsule (150 mg total) by mouth 2 (two) times daily. 11/20/18   Colon Branch, MD  omeprazole (PRILOSEC) 40 MG capsule Take 1 capsule (40 mg total) by mouth 2 (two) times daily before a meal. 12/18/18   Colon Branch, MD  ondansetron (ZOFRAN ODT) 4 MG disintegrating tablet Take 1 tablet (4 mg total) by mouth every 8 (eight) hours as needed for nausea or vomiting. Patient not taking: Reported on 09/05/2018 02/07/18   Colon Branch, MD  polyethylene glycol [facility] Floria Raveling) packet Take 17 g by mouth 2 (two) times daily.    [provider]  Vitamin D, Cholecalciferol, 25 MCG (1000  UT) TABS Take 2,000 Units by mouth daily. 09/27/18   Colon Branch, MD    Family History Family History  Problem Relation Age of Onset  . CAD Father   . Hypertension Father   . Diabetes Father   . Diabetes Brother   . Colon cancer Neg Hx   . Breast cancer Neg Hx     Social History Social History   Tobacco Use  . Smoking status: Never Smoker  . Smokeless tobacco: Never Used  Substance Use Topics  . Alcohol use: No    Comment: socially   . Drug use: No     Allergies   Tetracyclines & related, Erythromycin, Raspberry, and Sulfonamide derivatives   Review of Systems Review of Systems  Constitutional: Negative for fever.  Gastrointestinal: Positive for abdominal pain. Negative for constipation, hematemesis and hematochezia.  All other systems  reviewed and are negative.    Physical Exam Updated Vital Signs BP 101/71   Pulse (!) 102   Temp 98.1 F (36.7 C) (Oral)   Resp 16   Ht 5\' 3"  (1.6 m)   Wt 43.1 kg   SpO2 99%   BMI 16.83 kg/m   Physical Exam Vitals signs and nursing note reviewed.  Constitutional:      General: She is not in acute distress.    Appearance: She is well-developed. She is not diaphoretic.  HENT:     Head: Normocephalic and atraumatic.  Neck:     Musculoskeletal: Normal range of motion and neck supple.  Cardiovascular:     Rate and Rhythm: Normal rate and regular rhythm.     Heart sounds: No murmur. No friction rub. No gallop.   Pulmonary:     Effort: Pulmonary effort is normal. No respiratory distress.     Breath sounds: Normal breath sounds. No wheezing.  Abdominal:     General: Bowel sounds are normal. There is no distension.     Palpations: Abdomen is soft.     Tenderness: There is generalized abdominal tenderness. There is no right CVA tenderness, left CVA tenderness, guarding or rebound.  Musculoskeletal: Normal range of motion.  Skin:    General: Skin is warm and dry.  Neurological:     Mental Status: She is alert and oriented to person, place, and time.      ED Treatments / Results  Labs (all labs ordered are listed, but only abnormal results are displayed) Labs Reviewed  CBC - Abnormal; Notable for the following components:      Result Value   WBC 10.7 (*)    All other components within normal limits  LIPASE, BLOOD  COMPREHENSIVE METABOLIC PANEL  URINALYSIS, ROUTINE W REFLEX MICROSCOPIC  I-STAT BETA HCG BLOOD, ED ([facility], [facility], [facility] ONLY)    EKG    Radiology No results found.  Procedures Procedures (including critical care time)  Medications Ordered in ED Medications  sodium chloride flush (NS) 0.9 % injection 3 mL (has no administration in time range)  ondansetron (ZOFRAN) injection 4 mg (has no administration in time range)  ketorolac (TORADOL) 30 MG/ML injection  30 mg (has no administration in time range)     Initial Impression / Assessment and Plan / ED Course  I have reviewed the triage vital signs and the nursing notes.  Pertinent labs & imaging results that were available during my care of the patient were reviewed by me and considered in my medical decision making (see chart for details).  Patient feeling markedly improved after receiving  IV fluids, Zofran, and Toradol.  Her laboratory studies are all reassuring.  Patient with history of similar episodes and has a follow-up appointment with GI at [place] this coming Tuesday.  I feel as though patient is appropriate for discharge to keep that upcoming appointment.  To return as needed for any problems.  Final Clinical Impressions(s) / ED Diagnoses   Final diagnoses:  None    ED Discharge Orders    None       Veryl Speak, MD 01/27/19 0206

## 2019-01-26 NOTE — ED Notes (Signed)
Pt actively vomiting in waiting room. Taken back to triage

## 2019-01-26 NOTE — ED Triage Notes (Signed)
BIB EMS from home, reports abd pain since this AM with N/V. This is ongoing issue, has been dx with chronic intestinal pseudo-obstruction. Pt hypotensive en route despite 638ml NS. Pt also hypotensive upon arrival. Pt appears to be in discomfort.

## 2019-01-27 LAB — URINALYSIS, ROUTINE W REFLEX MICROSCOPIC
Bilirubin Urine: NEGATIVE
Glucose, UA: NEGATIVE mg/dL
Ketones, ur: 20 mg/dL — AB
Nitrite: NEGATIVE
Protein, ur: 30 mg/dL — AB
Specific Gravity, Urine: 1.029 (ref 1.005–1.030)
pH: 6 (ref 5.0–8.0)

## 2019-01-27 LAB — COMPREHENSIVE METABOLIC PANEL
ALT: 11 U/L (ref 0–44)
AST: 14 U/L — ABNORMAL LOW (ref 15–41)
Albumin: 3.7 g/dL (ref 3.5–5.0)
Alkaline Phosphatase: 50 U/L (ref 38–126)
Anion gap: 9 (ref 5–15)
BUN: 19 mg/dL (ref 6–20)
CO2: 23 mmol/L (ref 22–32)
Calcium: 9.3 mg/dL (ref 8.9–10.3)
Chloride: 104 mmol/L (ref 98–111)
Creatinine, Ser: 0.59 mg/dL (ref 0.44–1.00)
GFR calc Af Amer: >60 mL/min (ref >60)
GFR calc non Af Amer: >60 mL/min (ref >60)
Glucose, Bld: 113 mg/dL — ABNORMAL HIGH (ref 70–99)
Potassium: 3.8 mmol/L (ref 3.5–5.1)
Sodium: 136 mmol/L (ref 135–145)
Total Bilirubin: 1.3 mg/dL — ABNORMAL HIGH (ref 0.3–1.2)
Total Protein: 6.4 g/dL — ABNORMAL LOW (ref 6.5–8.1)

## 2019-01-27 LAB — LIPASE, BLOOD: Lipase: 28 U/L (ref 11–51)

## 2019-01-27 NOTE — ED Notes (Signed)
Pt reports no nausea and pain after med admin.

## 2019-01-27 NOTE — Discharge Instructions (Addendum)
Continue Zofran as needed for nausea.  Follow-up with your gastroenterologist at [facility] on Tuesday as scheduled, and return to the ER if you develop worsening pain, high fever, bloody stools, or other new and concerning symptoms in the meantime.

## 2019-01-27 NOTE — ED Notes (Signed)
Pt ambulated to bathroom with steady gait. 

## 2019-02-09 ENCOUNTER — Other Ambulatory Visit: Payer: Self-pay

## 2019-02-09 ENCOUNTER — Ambulatory Visit (INDEPENDENT_AMBULATORY_CARE_PROVIDER_SITE_OTHER): Payer: 59

## 2019-02-09 DIAGNOSIS — E538 Deficiency of other specified B group vitamins: Secondary | ICD-10-CM | POA: Diagnosis not present

## 2019-02-09 MED ORDER — CYANOCOBALAMIN 1000 MCG/ML IJ SOLN
1000.0000 ug | Freq: Once | INTRAMUSCULAR | Status: AC
  Administered 2019-02-09: 1000 ug via INTRAMUSCULAR

## 2019-02-09 NOTE — Progress Notes (Addendum)
Pt here for monthly B12 injection per Dr. Larose Kells  B12 1053mcg given IM in left deltoid, and pt tolerated injection well.  Next B12 injection scheduled for 03/16/2019 at appointment with Dr. Larose Kells.  Kathlene November, MD

## 2019-02-20 ENCOUNTER — Telehealth: Payer: Self-pay | Admitting: Internal Medicine

## 2019-02-20 MED ORDER — ONDANSETRON 4 MG PO TBDP: 4.0000 mg | ORAL_TABLET | Freq: Three times a day (TID) | ORAL | 0 refills | Status: DC | PRN

## 2019-02-20 NOTE — Telephone Encounter (Signed)
Takes zofran sporadically, RF sent

## 2019-02-20 NOTE — Telephone Encounter (Signed)
Refill request for Zofran, okay to refill?

## 2019-03-15 ENCOUNTER — Other Ambulatory Visit: Payer: Self-pay

## 2019-03-16 ENCOUNTER — Ambulatory Visit: Payer: 59 | Admitting: Internal Medicine

## 2019-03-16 ENCOUNTER — Encounter: Payer: Self-pay | Admitting: Internal Medicine

## 2019-03-16 VITALS — BP 87/63 | HR 92 | Temp 98.0°F | Resp 16 | Ht 63.0 in | Wt 93.5 lb

## 2019-03-16 DIAGNOSIS — E539 Vitamin B deficiency, unspecified: Secondary | ICD-10-CM | POA: Diagnosis not present

## 2019-03-16 DIAGNOSIS — R636 Underweight: Secondary | ICD-10-CM

## 2019-03-16 DIAGNOSIS — E559 Vitamin D deficiency, unspecified: Secondary | ICD-10-CM

## 2019-03-16 DIAGNOSIS — K598 Other specified functional intestinal disorders: Secondary | ICD-10-CM

## 2019-03-16 DIAGNOSIS — K5989 Other specified functional intestinal disorders: Secondary | ICD-10-CM

## 2019-03-16 MED ORDER — CYANOCOBALAMIN 1000 MCG/ML IJ SOLN
1000.0000 ug | Freq: Once | INTRAMUSCULAR | Status: AC
  Administered 2019-03-16: 10:00:00 1000 ug via INTRAMUSCULAR

## 2019-03-16 NOTE — Progress Notes (Signed)
Pre visit review using our clinic review tool, if applicable. No additional management support is needed unless otherwise documented below in the visit note. 

## 2019-03-16 NOTE — Patient Instructions (Signed)
   GO TO THE FRONT DESK Come back for your B12 shots monthly  Schedule your next appointment   for a physical exam in 8 months

## 2019-03-16 NOTE — Progress Notes (Signed)
Subjective:    Patient ID: Stacy [name], female    DOB: Jul 22, 1981, 38 y.o.   MRN: [id number]  DOS:  03/16/2019 Type of visit - description: Routine follow-up Today he reports that she is feeling very well. Was seen at the emergency room in June with dehydration After that saw her GI doctor at [facility] MRI was done Since then he is doing great   MRI abd @ [facility] 03/02/2019 1. Benign hemangioma in the caudate lobe.  2. Less well-defined posterior right hepatic lobe lesion with subtle peripheral enhancement does not have the appearance of a typical hemangioma, but could be seen in setting of sclerosing hemangioma. Comparison with the prior outside imaging would be helpful.  3. Delayed enhancement of a 2.5 x 1.5 cm lesion in the lateral left hepatic lobe lateral with nonspecific MRI features, as detailed above. This could represent an atypical hemangioma or a left hepatic lobe adenoma. Correlation with available prior imaging would be of benefit for additional characterization and to assess stability.  4. Prominence of the common duct up to 8 mm in diameter without evidence of obstructing lesion.     Wt Readings from Last 3 Encounters:  03/16/19 93 lb 8 oz (42.4 kg)  01/26/19 95 lb (43.1 kg)  09/05/18 93 lb (42.2 kg)  Review of Systems Feeling well, no other concerns Past Medical History:  Diagnosis Date  . Anxiety   . Benign liver cyst   . Chronic intestinal pseudo-obstruction   . GERD (gastroesophageal reflux disease)   . History of UTI     Past Surgical History:  Procedure Laterality Date  . CESAREAN SECTION     twice 2010, 2013  . Esure  ~2015    Social History   Socioeconomic History  . Marital status: Married    Spouse name: Not on file  . Number of children: Not on file  . Years of education: Not on file  . Highest education level: Not on file  Occupational History  . Occupation: stay home ; associate degree paralega   Social Needs  . Financial  resource strain: Not on file  . Food insecurity    Worry: Not on file    Inability: Not on file  . Transportation needs    Medical: Not on file    Non-medical: Not on file  Tobacco Use  . Smoking status: Never Smoker  . Smokeless tobacco: Never Used  Substance and Sexual Activity  . Alcohol use: No    Comment: socially   . Drug use: No  . Sexual activity: Yes    Birth control/protection: None  Lifestyle  . Physical activity    Days per week: Not on file    Minutes per session: Not on file  . Stress: Not on file  Relationships  . Social Herbalist on phone: Not on file    Gets together: Not on file    Attends religious service: Not on file    Active member of club or organization: Not on file    Attends meetings of clubs or organizations: Not on file    Relationship status: Not on file  . Intimate partner violence    Fear of current or ex partner: Not on file    Emotionally abused: Not on file    Physically abused: Not on file    Forced sexual activity: Not on file  Other Topics Concern  . Not on file  Social History Narrative   Household:  pt, husband , 2 children   2 boys: 2010, 2013   P2G2   Original  from [place], no foreign trips      Allergies as of 03/16/2019      Reactions   Tetracyclines & Related Swelling   Swelling in spine; required spinal tap.   Erythromycin Other (See Comments)   unknown   Raspberry Rash   Sulfonamide Derivatives Rash      Medication List       Accurate as of March 16, 2019  9:19 AM. If you have any questions, ask your nurse or doctor.        calcium carbonate 200 MG capsule Take 250 mg by mouth 2 (two) times daily with a meal.   iron polysaccharides 150 MG capsule Commonly known as: Ferrex 150 Take 1 capsule (150 mg total) by mouth 2 (two) times daily.   Motegrity 2 MG Tabs Generic drug: Prucalopride Succinate Take 2 mg by mouth daily.   omeprazole 40 MG capsule Commonly known as: PRILOSEC Take 1 capsule  (40 mg total) by mouth 2 (two) times daily before a meal.   ondansetron 4 MG disintegrating tablet Commonly known as: Zofran ODT Take 1 tablet (4 mg total) by mouth every 8 (eight) hours as needed for nausea or vomiting.   polyethylene glycol 17 g packet Commonly known as: MIRALAX / GLYCOLAX Take 17 g by mouth 2 (two) times daily.   Vitamin D (Cholecalciferol) 25 MCG (1000 UT) Tabs Take 2,000 Units by mouth daily.           Objective:   Physical Exam BP (!) 87/63 (BP Location: Left Arm, Patient Position: Sitting, Cuff Size: Small)   Pulse 92   Temp 98 F (36.7 C) (Oral)   Resp 16   Ht 5\' 3"  (1.6 m)   Wt 93 lb 8 oz (42.4 kg)   SpO2 100%   BMI 16.56 kg/m  General:   Well developed, NAD, BMI noted.  HEENT:  Normocephalic . Face symmetric, atraumatic Lungs:  CTA B Normal respiratory effort, no intercostal retractions, no accessory muscle use. Heart: RRR,  no murmur.  no pretibial edema bilaterally  Abdomen:  Not distended, soft, non-tender. No rebound or rigidity.   Skin: Not pale. Not jaundice Neurologic:  alert & oriented X3.  Speech normal, gait appropriate for age and unassisted Psych--  Cognition and judgment appear intact.  Cooperative with normal attention span and concentration.  Behavior appropriate. No anxious or depressed appearing.     Assessment     Assessment Underweight  Per pt:Wt when she finished high school : 120 pounds.  After her second pregnancy in 2013 her weight was 140 pounds. Since 2013 she started to lose weight steadily, the smallest she has been is 85 pounds. 08-2015: 91 pounds.  05-2016 90 pounds Chronic intestinal pseudo-obstruction DX 05-2018 in [place].  Felt to be the cause of weight loss. GERD Benign liver cyst  H/o anxiety Gyn: Dr Rogue Bussing @ [facility]  GI:  -EGD 02/14/2014: Duodenum biopsy negative.  No Giardia, no sprue changes.  Stomach: Congested and erythematous on the prepyloric region, BX: no H. pylori, + reactive  gastropathy. -Colonoscopy 02/14/2014: one polyp,Bx, Granulation tissue polyp -03/2014: Barium enema normal -Abnormal liver imaging: CT 01/2016, ultrasound 08-2016 >>   MRI 08-2016 see report; MRI 7/17 /2020 -Follow-up at [facility] as of 02-2019   S/p ESURE 2015  PLAN Underweight: Stable Chronic interstitial pseudo-obstruction: Current GI service following her is [facility], reports today that she  is doing great, eating well, hardly ever needs Zofran.  Apparently the combination of MiraLAX and Motegrity are working well for her. Abnormal MRI abdomen: Follow-up at [facility] B12 deficiency, vitamin D deficiency: On supplements, monthly shots of B12 to continue Preventive care: Strongly encourage flu shot this season RTC 8 months CPX

## 2019-03-18 NOTE — Assessment & Plan Note (Signed)
  Underweight: Stable Chronic interstitial pseudo-obstruction: Current GI service following her is [facility], reports today that she is doing great, eating well, hardly ever needs Zofran.  Apparently the combination of MiraLAX and Motegrity are working well for her. Abnormal MRI abdomen: Follow-up at [facility] B12 deficiency, vitamin D deficiency: On supplements, monthly shots of B12 to continue Preventive care: Strongly encourage flu shot this season RTC 8 months CPX

## 2019-03-25 ENCOUNTER — Other Ambulatory Visit: Payer: Self-pay | Admitting: Internal Medicine

## 2019-04-07 ENCOUNTER — Observation Stay (HOSPITAL_COMMUNITY)
Admission: EM | Admit: 2019-04-07 | Discharge: 2019-04-08 | Disposition: A | Discharge status: Home / Self Care | Payer: 59 | Attending: Family Medicine | Admitting: Family Medicine

## 2019-04-07 ENCOUNTER — Other Ambulatory Visit: Payer: Self-pay

## 2019-04-07 DIAGNOSIS — N179 Acute kidney failure, unspecified: Secondary | ICD-10-CM | POA: Diagnosis not present

## 2019-04-07 DIAGNOSIS — K598 Other specified functional intestinal disorders: Secondary | ICD-10-CM | POA: Insufficient documentation

## 2019-04-07 DIAGNOSIS — D500 Iron deficiency anemia secondary to blood loss (chronic): Secondary | ICD-10-CM | POA: Insufficient documentation

## 2019-04-07 DIAGNOSIS — K219 Gastro-esophageal reflux disease without esophagitis: Secondary | ICD-10-CM | POA: Diagnosis not present

## 2019-04-07 DIAGNOSIS — I959 Hypotension, unspecified: Secondary | ICD-10-CM | POA: Diagnosis present

## 2019-04-07 DIAGNOSIS — E861 Hypovolemia: Secondary | ICD-10-CM | POA: Diagnosis present

## 2019-04-07 DIAGNOSIS — K5989 Other specified functional intestinal disorders: Secondary | ICD-10-CM | POA: Diagnosis present

## 2019-04-07 DIAGNOSIS — I951 Orthostatic hypotension: Secondary | ICD-10-CM | POA: Diagnosis not present

## 2019-04-07 DIAGNOSIS — R55 Syncope and collapse: Secondary | ICD-10-CM | POA: Diagnosis not present

## 2019-04-07 DIAGNOSIS — D649 Anemia, unspecified: Secondary | ICD-10-CM

## 2019-04-07 DIAGNOSIS — Z20828 Contact with and (suspected) exposure to other viral communicable diseases: Secondary | ICD-10-CM | POA: Insufficient documentation

## 2019-04-07 DIAGNOSIS — N390 Urinary tract infection, site not specified: Secondary | ICD-10-CM | POA: Diagnosis not present

## 2019-04-07 LAB — URINALYSIS, ROUTINE W REFLEX MICROSCOPIC
Bilirubin Urine: NEGATIVE
Glucose, UA: NEGATIVE mg/dL
Ketones, ur: NEGATIVE mg/dL
Nitrite: POSITIVE — AB
Protein, ur: 30 mg/dL — AB
Specific Gravity, Urine: 1.02 (ref 1.005–1.030)
pH: 5 (ref 5.0–8.0)

## 2019-04-07 LAB — COMPREHENSIVE METABOLIC PANEL
ALT: 9 U/L (ref 0–44)
AST: 15 U/L (ref 15–41)
Albumin: 3.6 g/dL (ref 3.5–5.0)
Alkaline Phosphatase: 44 U/L (ref 38–126)
Anion gap: 10 (ref 5–15)
BUN: 24 mg/dL — ABNORMAL HIGH (ref 6–20)
CO2: 21 mmol/L — ABNORMAL LOW (ref 22–32)
Calcium: 8.6 mg/dL — ABNORMAL LOW (ref 8.9–10.3)
Chloride: 106 mmol/L (ref 98–111)
Creatinine, Ser: 1.13 mg/dL — ABNORMAL HIGH (ref 0.44–1.00)
GFR calc Af Amer: >60 mL/min (ref >60)
GFR calc non Af Amer: >60 mL/min (ref >60)
Glucose, Bld: 95 mg/dL (ref 70–99)
Potassium: 5 mmol/L (ref 3.5–5.1)
Sodium: 137 mmol/L (ref 135–145)
Total Bilirubin: 1.7 mg/dL — ABNORMAL HIGH (ref 0.3–1.2)
Total Protein: 6.1 g/dL — ABNORMAL LOW (ref 6.5–8.1)

## 2019-04-07 LAB — CBC WITH DIFFERENTIAL/PLATELET
Abs Immature Granulocytes: 0.04 10*3/uL (ref 0.00–0.07)
Basophils Absolute: 0 10*3/uL (ref 0.0–0.1)
Basophils Relative: 0 %
Eosinophils Absolute: 0 10*3/uL (ref 0.0–0.5)
Eosinophils Relative: 0 %
HCT: 34.4 % — ABNORMAL LOW (ref 36.0–46.0)
Hemoglobin: 11.5 g/dL — ABNORMAL LOW (ref 12.0–15.0)
Immature Granulocytes: 0 %
Lymphocytes Relative: 5 %
Lymphs Abs: 0.5 10*3/uL — ABNORMAL LOW (ref 0.7–4.0)
MCH: 30.2 pg (ref 26.0–34.0)
MCHC: 33.4 g/dL (ref 30.0–36.0)
MCV: 90.3 fL (ref 80.0–100.0)
Monocytes Absolute: 0.8 10*3/uL (ref 0.1–1.0)
Monocytes Relative: 7 %
Neutro Abs: 9.6 10*3/uL — ABNORMAL HIGH (ref 1.7–7.7)
Neutrophils Relative %: 88 %
Platelets: 257 10*3/uL (ref 150–400)
RBC: 3.81 MIL/uL — ABNORMAL LOW (ref 3.87–5.11)
RDW: 11.8 % (ref 11.5–15.5)
WBC: 10.9 10*3/uL — ABNORMAL HIGH (ref 4.0–10.5)
nRBC: 0 % (ref 0.0–0.2)

## 2019-04-07 LAB — LIPASE, BLOOD: Lipase: 26 U/L (ref 11–51)

## 2019-04-07 LAB — I-STAT BETA HCG BLOOD, ED (MC, WL, AP ONLY): I-stat hCG, quantitative: <5 m[IU]/mL (ref <5)

## 2019-04-07 MED ORDER — SODIUM CHLORIDE 0.9 % IV SOLN
INTRAVENOUS | Status: DC
  Administered 2019-04-07: 17:00:00 via INTRAVENOUS

## 2019-04-07 MED ORDER — SODIUM CHLORIDE 0.9 % IV SOLN
1.0000 g | INTRAVENOUS | Status: DC
  Filled 2019-04-07: qty 10

## 2019-04-07 MED ORDER — POLYSACCHARIDE IRON COMPLEX 150 MG PO CAPS
150.0000 mg | ORAL_CAPSULE | Freq: Two times a day (BID) | ORAL | Status: DC
  Administered 2019-04-07 – 2019-04-08 (×2): 150 mg via ORAL
  Filled 2019-04-07 (×2): qty 1

## 2019-04-07 MED ORDER — ENOXAPARIN SODIUM 30 MG/0.3ML ~~LOC~~ SOLN
30.0000 mg | SUBCUTANEOUS | Status: DC
  Administered 2019-04-07: 19:00:00 30 mg via SUBCUTANEOUS
  Filled 2019-04-07: qty 0.3

## 2019-04-07 MED ORDER — SODIUM CHLORIDE 0.9% FLUSH: 3.0000 mL | Freq: Two times a day (BID) | INTRAVENOUS | Status: DC

## 2019-04-07 MED ORDER — SODIUM CHLORIDE 0.9 % IV SOLN
1.0000 g | Freq: Once | INTRAVENOUS | Status: AC
  Administered 2019-04-07: 16:00:00 1 g via INTRAVENOUS
  Filled 2019-04-07: qty 10

## 2019-04-07 MED ORDER — TRAZODONE HCL 50 MG PO TABS
50.0000 mg | ORAL_TABLET | Freq: Once | ORAL | Status: AC
  Administered 2019-04-07: 23:00:00 50 mg via ORAL
  Filled 2019-04-07: qty 1

## 2019-04-07 MED ORDER — SODIUM CHLORIDE 0.9 % IV BOLUS
1000.0000 mL | Freq: Once | INTRAVENOUS | Status: AC
  Administered 2019-04-07: 12:00:00 1000 mL via INTRAVENOUS

## 2019-04-07 MED ORDER — LOPERAMIDE HCL 2 MG PO CAPS
4.0000 mg | ORAL_CAPSULE | Freq: Once | ORAL | Status: AC
  Administered 2019-04-07: 4 mg via ORAL
  Filled 2019-04-07: qty 2

## 2019-04-07 MED ORDER — SODIUM CHLORIDE 0.9 % IV BOLUS
1000.0000 mL | Freq: Once | INTRAVENOUS | Status: AC
  Administered 2019-04-07: 13:00:00 1000 mL via INTRAVENOUS

## 2019-04-07 MED ORDER — PANTOPRAZOLE SODIUM 40 MG PO TBEC
40.0000 mg | DELAYED_RELEASE_TABLET | Freq: Two times a day (BID) | ORAL | Status: DC
  Administered 2019-04-07 – 2019-04-08 (×2): 40 mg via ORAL
  Filled 2019-04-07 (×2): qty 1

## 2019-04-07 MED ORDER — POLYETHYLENE GLYCOL 3350 17 G PO PACK: 17.0000 g | PACK | Freq: Two times a day (BID) | ORAL | Status: DC

## 2019-04-07 MED ORDER — FENTANYL CITRATE (PF) 100 MCG/2ML IJ SOLN
50.0000 ug | Freq: Once | INTRAMUSCULAR | Status: AC
  Administered 2019-04-07: 13:00:00 50 ug via INTRAVENOUS
  Filled 2019-04-07: qty 2

## 2019-04-07 MED ORDER — FAMOTIDINE IN NACL 20-0.9 MG/50ML-% IV SOLN
20.0000 mg | Freq: Once | INTRAVENOUS | Status: AC
  Administered 2019-04-07: 13:00:00 20 mg via INTRAVENOUS
  Filled 2019-04-07: qty 50

## 2019-04-07 MED ORDER — ACETAMINOPHEN 325 MG PO TABS
650.0000 mg | ORAL_TABLET | Freq: Four times a day (QID) | ORAL | Status: DC | PRN
  Administered 2019-04-07: 19:00:00 650 mg via ORAL
  Filled 2019-04-07: qty 2

## 2019-04-07 MED ORDER — POLYETHYLENE GLYCOL 3350 17 G PO PACK
17.0000 g | PACK | Freq: Every day | ORAL | Status: DC
  Administered 2019-04-08: 17 g via ORAL
  Filled 2019-04-07: qty 1

## 2019-04-07 MED ORDER — SODIUM CHLORIDE 0.9 % IV BOLUS
1000.0000 mL | Freq: Once | INTRAVENOUS | Status: AC
  Administered 2019-04-07: 1000 mL via INTRAVENOUS

## 2019-04-07 MED ORDER — PRUCALOPRIDE SUCCINATE 2 MG PO TABS: 2.0000 mg | ORAL_TABLET | Freq: Every day | ORAL | Status: DC

## 2019-04-07 NOTE — ED Triage Notes (Signed)
Pt presents with EMS from home for syncope this AM. Began vomiting last night and has not been able to intake fluids since. H/o pseudo-obstruction, frequent episodes of n/v and poor appetite.  Self administered 4mg  oral zofran (vomitted this up immediately), EMS administered 4mg  zofran at arrival and 1000cc NS. Normal SBP for this pt 90 per pt.

## 2019-04-07 NOTE — H&P (Addendum)
History and Physical    Stacy [name] [id number] DOB: [birth year] DOA: 04/07/2019  Referring MD/NP/PA: Eliezer Mccoy, PA-C PCP: Colon Branch, MD  Patient coming from: Home Via EMS  Chief Complaint: Syncope  I have personally briefly reviewed patient's old medical records in [place]   HPI: Stacy [name] is a 38 y.o. female with medical history significant of chronic intestinal pseudoobstruction, chronic nausea/vomiting, anxiety, and GERD; who presents after having a syncopal episode at home.  Patient reports that she had been started on Motegrity about a month ago in addition to MiraLAX twice daily by her GI doctor at [facility].  However, since being on both patient reports having multiple bowel movements per day.  She had developed epigastric abdominal pain and then started having nausea and vomiting.  She tried taking Pepcid and Zofran at home without relief of symptoms.  Patient reports that this is happened several times in the past during IV fluids.  Denies having any fever, dysuria, blood in stool, or chest pain.  However, this morning when she had gone to use the restroom the next thing that she recalls was waking up on the bed with her husband standing over her.  Initially he was going to drive her to the emergency department, but she felt like she was going to pass out again and called EMS.  Patient reports that her baseline systolic blood pressure is around 90.  She also has a follow-up appointment with her gastroenterologist by telemedicine next week to discuss her medications. En route with EMS patient was given 1000 mL of normal saline IV fluids and 4 mg of Zofran IV.    ED Course: On admission into the emergency department patient was noted to be afebrile, pulse 93-1 04, blood pressure as low 80/46 improved with IV fluids to 109/68, and all other vital signs maintained.  Labs revealed WBC 10.9, hemoglobin 11.5, BUN 24, creatinine 1.13 (baseline 0.5).  Urinalysis positive for  moderate leukocytes, positive nitrites, 21- 50 squamous epithelial cells, 21- 50  WBCs.,  And many bacteria.  She was ordered 2 L of normal saline IV fluids, Pepcid, fentanyl 50 mcg, and Rocephin IV.  TRH called to admit  Review of Systems  Constitutional: Positive for malaise/fatigue. Negative for chills and fever.  HENT: Negative for nosebleeds and sinus pain.   Respiratory: Negative for shortness of breath.   Cardiovascular: Negative for chest pain and leg swelling.  Gastrointestinal: Positive for diarrhea, nausea and vomiting.  Genitourinary: Positive for frequency. Negative for dysuria.  Musculoskeletal: Negative for back pain and myalgias.  Skin: Negative for itching.  Neurological: Positive for loss of consciousness and weakness.  Psychiatric/Behavioral: Negative for memory loss and substance abuse.    Past Medical History:  Diagnosis Date  . Anxiety   . Benign liver cyst   . Chronic intestinal pseudo-obstruction   . GERD (gastroesophageal reflux disease)   . History of UTI     Past Surgical History:  Procedure Laterality Date  . CESAREAN SECTION     twice 2010, 2013  . Esure  ~2015     reports that she has never smoked. She has never used smokeless tobacco. She reports that she does not drink alcohol or use drugs.  Allergies  Allergen Reactions  . Tetracyclines & Related Swelling    Swelling in spine; required spinal tap.   . Erythromycin Other (See Comments)    unknown  . Raspberry Rash  . Sulfonamide Derivatives Rash    Family  History  Problem Relation Age of Onset  . CAD Father   . Hypertension Father   . Diabetes Father   . Diabetes Brother   . Colon cancer Neg Hx   . Breast cancer Neg Hx     Prior to Admission medications   Medication Sig Start Date End Date Taking? Authorizing Provider  calcium carbonate 200 MG capsule Take 250 mg by mouth 2 (two) times daily with a meal.   Yes [provider]  iron polysaccharides (FERREX 150) 150 MG  capsule Take 1 capsule (150 mg total) by mouth 2 (two) times daily. 03/26/19  Yes Paz, Alda Berthold, MD  omeprazole (PRILOSEC) 40 MG capsule Take 1 capsule (40 mg total) by mouth 2 (two) times daily before a meal. 03/26/19  Yes Paz, Alda Berthold, MD  ondansetron (ZOFRAN ODT) 4 MG disintegrating tablet Take 1 tablet (4 mg total) by mouth every 8 (eight) hours as needed for nausea or vomiting. 02/20/19  Yes Paz, Alda Berthold, MD  polyethylene glycol [facility] / Floria Raveling) packet Take 17 g by mouth 2 (two) times daily.   Yes [provider]  Prucalopride Succinate (MOTEGRITY) 2 MG TABS Take 2 mg by mouth daily. 02/20/19  Yes [provider]  Vitamin D, Cholecalciferol, 25 MCG (1000 UT) TABS Take 2,000 Units by mouth daily. 09/27/18  Yes Colon Branch, MD    Physical Exam:  Constitutional: Underweight female who appears to be in NAD, calm, comfortable Vitals:   04/07/19 1300 04/07/19 1315 04/07/19 1345 04/07/19 1400  BP: 93/63 96/63 96/62  (!) 90/59  Pulse: 97 (!) 102 93 93  Resp: 13 13 12 15   Temp:      TempSrc:      SpO2: 98% 97% 99% 98%  Weight:      Height:       Eyes: PERRL, lids and conjunctivae normal ENMT: Mucous membranes are dry posterior pharynx clear of any exudate or lesions.  Neck: normal, supple, no masses, no thyromegaly Respiratory: clear to auscultation bilaterally, no wheezing, no crackles. Normal respiratory effort. No accessory muscle use.  Cardiovascular: Regular rate and rhythm, no murmurs / rubs / gallops. No extremity edema. 2+ pedal pulses. No carotid bruits.  Abdomen: Mild midline tenderness, no masses palpated. No hepatosplenomegaly. Bowel sounds positive.  Musculoskeletal: no clubbing / cyanosis. No joint deformity upper and lower extremities. Good ROM, no contractures. Normal muscle tone.  Skin: no rashes, lesions, ulcers. No induration Neurologic: CN 2-12 grossly intact. Sensation intact, DTR normal. Strength 5/5 in all 4.  Psychiatric: Normal judgment and insight. Alert  and oriented x 3. Normal mood.     Labs on Admission: I have personally reviewed following labs and imaging studies  CBC: Recent Labs  Lab 04/07/19 1210  WBC 10.9*  NEUTROABS 9.6*  HGB 11.5*  HCT 34.4*  MCV 90.3  PLT 99991111   Basic Metabolic Panel: Recent Labs  Lab 04/07/19 1210  NA 137  K 5.0  CL 106  CO2 21*  GLUCOSE 95  BUN 24*  CREATININE 1.13*  CALCIUM 8.6*   GFR: Estimated Creatinine Clearance: 45.5 mL/min (A) (by C-G formula based on SCr of 1.13 mg/dL (H)). Liver Function Tests: Recent Labs  Lab 04/07/19 1210  AST 15  ALT 9  ALKPHOS 44  BILITOT 1.7*  PROT 6.1*  ALBUMIN 3.6   Recent Labs  Lab 04/07/19 1210  LIPASE 26   No results for input(s): AMMONIA in the last 168 hours. Coagulation Profile: No results for input(s):  INR, PROTIME in the last 168 hours. Cardiac Enzymes: No results for input(s): CKTOTAL, CKMB, CKMBINDEX, TROPONINI in the last 168 hours. BNP (last 3 results) No results for input(s): PROBNP in the last 8760 hours. HbA1C: No results for input(s): HGBA1C in the last 72 hours. CBG: No results for input(s): GLUCAP in the last 168 hours. Lipid Profile: No results for input(s): CHOL, HDL, LDLCALC, TRIG, CHOLHDL, LDLDIRECT in the last 72 hours. Thyroid Function Tests: No results for input(s): TSH, T4TOTAL, FREET4, T3FREE, THYROIDAB in the last 72 hours. Anemia Panel: No results for input(s): VITAMINB12, FOLATE, FERRITIN, TIBC, IRON, RETICCTPCT in the last 72 hours. Urine analysis:    Component Value Date/Time   COLORURINE AMBER (A) 04/07/2019 1411   APPEARANCEUR CLOUDY (A) 04/07/2019 1411   LABSPEC 1.020 04/07/2019 1411   PHURINE 5.0 04/07/2019 1411   GLUCOSEU NEGATIVE 04/07/2019 1411   HGBUR MODERATE (A) 04/07/2019 1411   BILIRUBINUR NEGATIVE 04/07/2019 1411   KETONESUR NEGATIVE 04/07/2019 1411   PROTEINUR 30 (A) 04/07/2019 1411   UROBILINOGEN 0.2 09/13/2013 0322   NITRITE POSITIVE (A) 04/07/2019 1411   LEUKOCYTESUR MODERATE  (A) 04/07/2019 1411   Sepsis Labs: No results found for this or any previous visit (from the past 240 hour(s)).   Radiological Exams on Admission: No results found.  EKG: Independently reviewed.  Sinus tachycardia 100 bpm  Assessment/Plan Syncope: Acute.  Patient noted to have syncopal episode while at home.  Patient appears to be dehydrated orthostatic hypotension as a cause of syncope. -Admit to medical telemetry -Check orthostatic vital signs -Follow-up telemetry overnight  Acute kidney injury secondary to dehydration: Acute.  Patient baseline creatinine previously noted around 0.5,  But patient presents with creatinine elevated up to 1.13 with BUN 24 suggesting prerenal cause of symptoms.  Patient received a total of 3 L during time with EMS and in the emergency department. -Normal saline IV fluids at 75 mL/h -Recheck kidney function in a.m.  Possible urinary tract infection: Patient denies having any dysuria symptoms, but reports urinary frequency.  Urinalysis possibly contaminated.  Patient was started on Rocephin IV. -Follow-up urine culture -Continue Rocephin for now  Leukocytosis: Acute.  WBC elevated at 10.9.  Could be reactive to syncopal episode versus underlying infection. -Recheck CBC in a.m.  Transient hypotension: Resolved.  Blood pressures initially as low as 80/46, but improved with IV fluids. -Continue to monitor  Nausea and vomiting: Acute on chronic. -Antiemetics as needed -Diet as tolerated  Hyperbilirubinemia: Acute.  Total bilirubin mildly elevated at 1.7. -Check fractionated bilirubin in a.m.  Normocytic anemia: Hemoglobin mildly low at 11.5 from baseline. -Continue ferrous sulfate -Recheck CBC in a.m.  Chronic intestinal pseudoobstruction: Patient has been started on on [place] by her GI specialist at [facility] in addition to MiraLAX twice daily. -Continue Motegrity and trial of decreased dose of MiraLAX of once daily -Continue outpatient follow-up  with gastroenterology at [place] substitution of Protonix  DVT prophylaxis: lovenox Code Status: Full Family Communication: No family present at bedside Disposition Plan: Possible discharge home in a.m. Consults called: None Admission status:  observation  Norval Morton MD [facility] Pager [phone]   If 7PM-7AM, please contact night-coverage [email or web address] Password [facility]  04/07/2019, 3:25 PM

## 2019-04-07 NOTE — ED Provider Notes (Signed)
[place] EMERGENCY DEPARTMENT Provider Note   CSN: [id number] Arrival date & time: 04/07/19  1204     History   Chief Complaint Chief Complaint  Patient presents with  . Loss of Consciousness    HPI Stacy [name] is a 38 y.o. female with history of GERD, chronic intestinal pseudoobstruction who presents with nausea, vomiting, upper abdominal pain, and syncope secondary to dehydration and hypotension.  Patient states this happens a lot.  She has been dealing with this diagnosis for the past year and is seeing [facility].  Patient has been taking her home medications, however she is not able to keep them down.  She has not been able to keep any food or fluids down.  She states the symptoms are typical for her, and she requires IV fluids a lot.  Patient denies any fever, chest pain, shortness of breath, urinary symptoms, back pain, diarrhea, bloody stools.     HPI  Past Medical History:  Diagnosis Date  . Anxiety   . Benign liver cyst   . Chronic intestinal pseudo-obstruction   . GERD (gastroesophageal reflux disease)   . History of UTI     Patient Active Problem List   Diagnosis Date Noted  . 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 Iron deficiency anemia 01/22/2018  . Vitamin B deficiency 01/22/2018  . Vitamin D deficiency 01/22/2018  . PCP NOTES >>>>>>>>>>>>>>>>>>>>>>>>>>>>> 06/28/2017  . Underweight 06/27/2017  . Cyst of ovary 10/05/2016  . Hypotension, unspecified 07/27/2013  . Hyperglycemia 07/27/2013  . Hepatic adenoma 07/27/2013    Past Surgical History:  Procedure Laterality Date  . CESAREAN SECTION     twice 2010, 2013  . Esure  ~2015     OB History    Gravida  2   Para  2   Term  1   Preterm      AB      Living  2     SAB      TAB      Ectopic      Multiple      Live Births  1            Home Medications    Prior to Admission medications   Medication Sig Start Date End Date Taking? Authorizing  Provider  calcium carbonate 200 MG capsule Take 250 mg by mouth 2 (two) times daily with a meal.   Yes [provider]  iron polysaccharides (FERREX 150) 150 MG capsule Take 1 capsule (150 mg total) by mouth 2 (two) times daily. 03/26/19  Yes Paz, Alda Berthold, MD  omeprazole (PRILOSEC) 40 MG capsule Take 1 capsule (40 mg total) by mouth 2 (two) times daily before a meal. 03/26/19  Yes Paz, Alda Berthold, MD  ondansetron (ZOFRAN ODT) 4 MG disintegrating tablet Take 1 tablet (4 mg total) by mouth every 8 (eight) hours as needed for nausea or vomiting. 02/20/19  Yes Paz, Alda Berthold, MD  polyethylene glycol [facility] / Floria Raveling) packet Take 17 g by mouth 2 (two) times daily.   Yes [provider]  Prucalopride Succinate (MOTEGRITY) 2 MG TABS Take 2 mg by mouth daily. 02/20/19  Yes [provider]  Vitamin D, Cholecalciferol, 25 MCG (1000 UT) TABS Take 2,000 Units by mouth daily. 09/27/18  Yes Colon Branch, MD    Family History Family History  Problem Relation Age of Onset  . CAD Father   . Hypertension Father   . Diabetes Father   .  Diabetes Brother   . Colon cancer Neg Hx   . Breast cancer Neg Hx     Social History Social History   Tobacco Use  . Smoking status: Never Smoker  . Smokeless tobacco: Never Used  Substance Use Topics  . Alcohol use: No    Comment: socially   . Drug use: No     Allergies   Tetracyclines & related, Erythromycin, Raspberry, and Sulfonamide derivatives   Review of Systems Review of Systems  Constitutional: Negative for chills and fever.  HENT: Negative for facial swelling and sore throat.   Respiratory: Negative for shortness of breath.   Cardiovascular: Negative for chest pain.  Gastrointestinal: Positive for abdominal pain, nausea and vomiting. Negative for diarrhea.  Genitourinary: Negative for dysuria.  Musculoskeletal: Negative for back pain.  Skin: Negative for rash and wound.  Neurological: Positive for syncope and light-headedness.  Negative for headaches.  Psychiatric/Behavioral: The patient is not nervous/anxious.      Physical Exam Updated Vital Signs BP 109/68   Pulse (!) 101   Temp 98.6 F (37 C) (Oral)   Resp 19   Ht 5\' 3"  (1.6 m)   Wt 43.1 kg   LMP 03/24/2019 (Exact Date)   SpO2 99%   BMI 16.83 kg/m   Physical Exam Vitals signs and nursing note reviewed.  Constitutional:      General: She is not in acute distress.    Appearance: She is well-developed. She is not diaphoretic.     Comments: Thin  HENT:     Head: Normocephalic and atraumatic.     Mouth/Throat:     Pharynx: No oropharyngeal exudate.  Eyes:     General: No scleral icterus.       Right eye: No discharge.        Left eye: No discharge.     Conjunctiva/sclera: Conjunctivae normal.     Pupils: Pupils are equal, round, and reactive to light.  Neck:     Musculoskeletal: Normal range of motion and neck supple.     Thyroid: No thyromegaly.  Cardiovascular:     Rate and Rhythm: Regular rhythm. Tachycardia present.     Heart sounds: Normal heart sounds. No murmur. No friction rub. No gallop.   Pulmonary:     Effort: Pulmonary effort is normal. No respiratory distress.     Breath sounds: Normal breath sounds. No stridor. No wheezing or rales.  Abdominal:     General: Bowel sounds are normal. There is no distension.     Palpations: Abdomen is soft.     Tenderness: There is no abdominal tenderness. There is no guarding or rebound.  Lymphadenopathy:     Cervical: No cervical adenopathy.  Skin:    General: Skin is warm and dry.     Coloration: Skin is not pale.     Findings: No rash.  Neurological:     Mental Status: She is alert.     Coordination: Coordination normal.      ED Treatments / Results  Labs (all labs ordered are listed, but only abnormal results are displayed) Labs Reviewed  COMPREHENSIVE METABOLIC PANEL - Abnormal; Notable for the following components:      Result Value   CO2 21 (*)    BUN 24 (*)     Creatinine, Ser 1.13 (*)    Calcium 8.6 (*)    Total Protein 6.1 (*)    Total Bilirubin 1.7 (*)    All other components within normal limits  CBC WITH  DIFFERENTIAL/PLATELET - Abnormal; Notable for the following components:   WBC 10.9 (*)    RBC 3.81 (*)    Hemoglobin 11.5 (*)    HCT 34.4 (*)    Neutro Abs 9.6 (*)    Lymphs Abs 0.5 (*)    All other components within normal limits  URINALYSIS, ROUTINE W REFLEX MICROSCOPIC - Abnormal; Notable for the following components:   Color, Urine AMBER (*)    APPearance CLOUDY (*)    Hgb urine dipstick MODERATE (*)    Protein, ur 30 (*)    Nitrite POSITIVE (*)    Leukocytes,Ua MODERATE (*)    Bacteria, UA MANY (*)    All other components within normal limits  URINE CULTURE  LIPASE, BLOOD  I-STAT BETA HCG BLOOD, ED ([facility], [facility], [facility] ONLY)    EKG EKG Interpretation  Date/Time:  Saturday April 07 2019 12:25:29 EDT Ventricular Rate:  100 PR Interval:    QRS Duration: 63 QT Interval:  313 QTC Calculation: 404 R Axis:   47 Text Interpretation:  Sinus tachycardia Low voltage, precordial leads Nonspecific T wave abnormality No previous tracing Confirmed by Lajean Saver [phone]) on 04/07/2019 12:27:45 PM   Radiology No results found.  Procedures Procedures (including critical care time)  Medications Ordered in ED Medications  cefTRIAXone (ROCEPHIN) 1 g in sodium chloride 0.9 % 100 mL IVPB (has no administration in time range)  sodium chloride 0.9 % bolus 1,000 mL (0 mLs Intravenous Stopped 04/07/19 1411)  sodium chloride 0.9 % bolus 1,000 mL (0 mLs Intravenous Stopped 04/07/19 1410)  famotidine (PEPCID) IVPB 20 mg premix (0 mg Intravenous Stopped 04/07/19 1404)  fentaNYL (SUBLIMAZE) injection 50 mcg (50 mcg Intravenous Given 04/07/19 1256)     Initial Impression / Assessment and Plan / ED Course  I have reviewed the triage vital signs and the nursing notes.  Pertinent labs & imaging results that were available during my care of the patient  were reviewed by me and considered in my medical decision making (see chart for details).        Patient presenting with AKI and UTI.  Patient has had nausea and vomiting and abdominal pain that is consistent with her typical of chronic abdominal pseudo-obstruction.  However she has a significant elevation in creatinine from her baseline of 0.59 to 1.13.  She had a syncopal episode at home.  She is tolerating ice chips now, however considering patient's difficulty tolerating PO at home, I feel patient would benefit from an observation admission for IV antibiotics and stabilization of emesis and AKI.  UA shows moderate hematuria, positive nitrites, moderate leukocytes, 21-50 WBCs, many bacteria.  Urine culture sent. I discussed patient case with Dr. Tamala Julian with [facility] who accepts patient for admission.  I appreciate his assistance with the patient.  Patient also evaluated by my attending, Dr. Ashok Cordia, who guided the patient's management and agrees with plan.  Final Clinical Impressions(s) / ED Diagnoses   Final diagnoses:  AKI (acute kidney injury) ([place])  Syncope, unspecified syncope type  Lower urinary tract infectious disease    ED Discharge Orders    None       Frederica Kuster, PA-C 04/07/19 1540    Lajean Saver, MD 04/07/19 1554

## 2019-04-07 NOTE — ED Notes (Signed)
Regular dinner tray ordered 

## 2019-04-07 NOTE — ED Notes (Signed)
ED TO INPATIENT HANDOFF REPORT  ED Nurse Name and Phone #: [phone]  S Name/Age/Gender Stacy [name] 38 y.o. female Room/Bed: 012C/012C  Code Status   Code Status: Full Code  Home/SNF/Other Home Patient oriented to: self, place, time and situation Is this baseline? Yes   Triage Complete: Triage complete  Chief Complaint Syncope  Triage Note Pt presents with EMS from home for syncope this AM. Began vomiting last night and has not been able to intake fluids since. H/o pseudo-obstruction, frequent episodes of n/v and poor appetite.  Self administered 4mg  oral zofran (vomitted this up immediately), EMS administered 4mg  zofran at arrival and 1000cc NS. Normal SBP for this pt 90 per pt.   Allergies Allergies  Allergen Reactions  . Tetracyclines & Related Swelling    Swelling in spine; required spinal tap.   . Erythromycin Other (See Comments)    unknown  . Raspberry Rash  . Sulfonamide Derivatives Rash    Level of Care/Admitting Diagnosis ED Disposition    ED Disposition Condition [facility]: [facility] [place] [100100]  Level of Care: Telemetry Medical [104]  I expect the patient will be discharged within 24 hours: No (not a candidate for 5C-Observation unit)  Covid Evaluation: Asymptomatic Screening Protocol (No Symptoms)  Diagnosis: Syncope [id number]  Admitting Physician: Norval Morton [id number]  Attending Physician: Norval Morton [id number]  PT Class (Do Not Modify): Observation [104]  PT Acc Code (Do Not Modify): Observation [10022]       B Medical/Surgery History Past Medical History:  Diagnosis Date  . Anxiety   . Benign liver cyst   . Chronic intestinal pseudo-obstruction   . GERD (gastroesophageal reflux disease)   . History of UTI    Past Surgical History:  Procedure Laterality Date  . CESAREAN SECTION     twice 2010, 2013  . Esure  ~2015     A IV Location/Drains/Wounds Patient Lines/Drains/Airways  Status   Active Line/Drains/Airways    Name:   Placement date:   Placement time:   Site:   Days:   Peripheral IV 04/07/19 Left Antecubital   04/07/19    1208    Antecubital   less than 1          Intake/Output Last 24 hours  Intake/Output Summary (Last 24 hours) at 04/07/2019 1702 Last data filed at 04/07/2019 1643 Gross per 24 hour  Intake 3100 ml  Output 100 ml  Net 3000 ml    Labs/Imaging Results for orders placed or performed during the hospital encounter of 04/07/19 (from the past 48 hour(s))  Comprehensive metabolic panel     Status: Abnormal   Collection Time: 04/07/19 12:10 PM  Result Value Ref Range   Sodium 137 135 - 145 mmol/L   Potassium 5.0 3.5 - 5.1 mmol/L   Chloride 106 98 - 111 mmol/L   CO2 21 (L) 22 - 32 mmol/L   Glucose, Bld 95 70 - 99 mg/dL   BUN 24 (H) 6 - 20 mg/dL   Creatinine, Ser 1.13 (H) 0.44 - 1.00 mg/dL   Calcium 8.6 (L) 8.9 - 10.3 mg/dL   Total Protein 6.1 (L) 6.5 - 8.1 g/dL   Albumin 3.6 3.5 - 5.0 g/dL   AST 15 15 - 41 U/L   ALT 9 0 - 44 U/L   Alkaline Phosphatase 44 38 - 126 U/L   Total Bilirubin 1.7 (H) 0.3 - 1.2 mg/dL   GFR calc non Af Amer >  60 >60 mL/min   GFR calc Af Amer >60 >60 mL/min   Anion gap 10 5 - 15    Comment: Performed at [facility] [address], [place], [place] [address]  Lipase, blood     Status: None   Collection Time: 04/07/19 12:10 PM  Result Value Ref Range   Lipase 26 11 - 51 U/L    Comment: Performed at [facility] [address], [place], [place] [address]  CBC with Differential     Status: Abnormal   Collection Time: 04/07/19 12:10 PM  Result Value Ref Range   WBC 10.9 (H) 4.0 - 10.5 K/uL   RBC 3.81 (L) 3.87 - 5.11 MIL/uL   Hemoglobin 11.5 (L) 12.0 - 15.0 g/dL   HCT 34.4 (L) 36.0 - 46.0 %   MCV 90.3 80.0 - 100.0 fL   MCH 30.2 26.0 - 34.0 pg   MCHC 33.4 30.0 - 36.0 g/dL   RDW 11.8 11.5 - 15.5 %   Platelets 257 150 - 400 K/uL   nRBC 0.0 0.0 - 0.2 %   Neutrophils Relative % 88 %   Neutro  Abs 9.6 (H) 1.7 - 7.7 K/uL   Lymphocytes Relative 5 %   Lymphs Abs 0.5 (L) 0.7 - 4.0 K/uL   Monocytes Relative 7 %   Monocytes Absolute 0.8 0.1 - 1.0 K/uL   Eosinophils Relative 0 %   Eosinophils Absolute 0.0 0.0 - 0.5 K/uL   Basophils Relative 0 %   Basophils Absolute 0.0 0.0 - 0.1 K/uL   WBC Morphology See Note     Comment: Increased Bands. >20% Bands   Immature Granulocytes 0 %   Abs Immature Granulocytes 0.04 0.00 - 0.07 K/uL    Comment: Performed at [facility], [place] [address], [place], [place] [address]  I-Stat beta hCG blood, ED     Status: None   Collection Time: 04/07/19 12:20 PM  Result Value Ref Range   I-stat hCG, quantitative <5.0 <5 mIU/mL   Comment 3            Comment:   GEST. AGE      CONC.  (mIU/mL)   <=1 WEEK        5 - 50     2 WEEKS       50 - 500     3 WEEKS       100 - 10,000     4 WEEKS     1,000 - 30,000        FEMALE AND NON-PREGNANT FEMALE:     LESS THAN 5 mIU/mL   Urinalysis, Routine w reflex microscopic     Status: Abnormal   Collection Time: 04/07/19  2:11 PM  Result Value Ref Range   Color, Urine AMBER (A) YELLOW    Comment: BIOCHEMICALS MAY BE AFFECTED BY COLOR   APPearance CLOUDY (A) CLEAR   Specific Gravity, Urine 1.020 1.005 - 1.030   pH 5.0 5.0 - 8.0   Glucose, UA NEGATIVE NEGATIVE mg/dL   Hgb urine dipstick MODERATE (A) NEGATIVE   Bilirubin Urine NEGATIVE NEGATIVE   Ketones, ur NEGATIVE NEGATIVE mg/dL   Protein, ur 30 (A) NEGATIVE mg/dL   Nitrite POSITIVE (A) NEGATIVE   Leukocytes,Ua MODERATE (A) NEGATIVE   RBC / HPF 6-10 0 - 5 RBC/hpf   WBC, UA 21-50 0 - 5 WBC/hpf   Bacteria, UA MANY (A) NONE SEEN   Squamous Epithelial / LPF 21-50 0 -  5   Mucus PRESENT    Hyaline Casts, UA PRESENT     Comment: Performed at [facility], [place] [address] [place], [place] [address]   No results found.  Pending Labs Unresulted Labs (From admission, onward)    Start     Ordered   04/08/19 0500  CBC  Tomorrow morning,   R     04/07/19  1551   04/08/19 [id number]  Basic metabolic panel  Tomorrow morning,   R     04/07/19 1551   04/08/19 0500  Magnesium  Tomorrow morning,   R     04/07/19 1551   04/07/19 1626  Novel Coronavirus, NAA (hospital order; send-out to ref lab)  (Symptomatic/High Risk/Tier 1)  Once,   STAT    Question Answer Comment  Is this test for diagnosis or screening Diagnosis of ill patient   Symptomatic for COVID-19 as defined by CDC Yes   Date of Symptom Onset 04/08/2019   Hospitalized for COVID-19 Yes   Admitted to ICU for COVID-19 No   Previously tested for COVID-19 No   Resident in a congregate (group) care setting Unknown   Employed in healthcare setting Unknown   Pregnant Unknown      04/07/19 1625   04/07/19 1447  Urine culture  ONCE - STAT,   STAT     04/07/19 1446          Vitals/Pain Today's Vitals   04/07/19 1430 04/07/19 1617 04/07/19 1630 04/07/19 1636  BP: 109/68 97/66 105/69   Pulse: (!) 101 (!) 104 (!) 110   Resp: 19 15 20    Temp:      TempSrc:      SpO2: 99% 99% 98%   Weight:      Height:      PainSc:    0-No pain    Isolation Precautions No active isolations  Medications Medications  pantoprazole (PROTONIX) EC tablet 40 mg (has no administration in time range)  iron polysaccharides (NIFEREX) capsule 150 mg (has no administration in time range)  enoxaparin (LOVENOX) injection 30 mg (has no administration in time range)  sodium chloride flush (NS) 0.9 % injection 3 mL (has no administration in time range)  0.9 %  sodium chloride infusion ( Intravenous New Bag/Given 04/07/19 1644)  Prucalopride Succinate TABS 2 mg (has no administration in time range)  polyethylene glycol (MIRALAX / GLYCOLAX) packet 17 g (has no administration in time range)  cefTRIAXone (ROCEPHIN) 1 g in sodium chloride 0.9 % 100 mL IVPB (has no administration in time range)  sodium chloride 0.9 % bolus 1,000 mL (0 mLs Intravenous Stopped 04/07/19 1411)  sodium chloride 0.9 % bolus 1,000 mL (0 mLs  Intravenous Stopped 04/07/19 1410)  famotidine (PEPCID) IVPB 20 mg premix (0 mg Intravenous Stopped 04/07/19 1404)  fentaNYL (SUBLIMAZE) injection 50 mcg (50 mcg Intravenous Given 04/07/19 1256)  cefTRIAXone (ROCEPHIN) 1 g in sodium chloride 0.9 % 100 mL IVPB (0 g Intravenous Stopped 04/07/19 1643)    Mobility walks High fall risk   Focused Assessments    R Recommendations: See Admitting Provider Note  Report given to:   Additional Notes:

## 2019-04-08 ENCOUNTER — Other Ambulatory Visit: Payer: Self-pay

## 2019-04-08 ENCOUNTER — Encounter (HOSPITAL_COMMUNITY): Payer: Self-pay

## 2019-04-08 DIAGNOSIS — R55 Syncope and collapse: Secondary | ICD-10-CM | POA: Diagnosis not present

## 2019-04-08 DIAGNOSIS — I951 Orthostatic hypotension: Secondary | ICD-10-CM | POA: Diagnosis present

## 2019-04-08 LAB — CBC
HCT: 26 % — ABNORMAL LOW (ref 36.0–46.0)
Hemoglobin: 8.6 g/dL — ABNORMAL LOW (ref 12.0–15.0)
MCH: 30.3 pg (ref 26.0–34.0)
MCHC: 33.1 g/dL (ref 30.0–36.0)
MCV: 91.5 fL (ref 80.0–100.0)
Platelets: 204 10*3/uL (ref 150–400)
RBC: 2.84 MIL/uL — ABNORMAL LOW (ref 3.87–5.11)
RDW: 12.2 % (ref 11.5–15.5)
WBC: 6.7 10*3/uL (ref 4.0–10.5)
nRBC: 0 % (ref 0.0–0.2)

## 2019-04-08 LAB — BASIC METABOLIC PANEL
Anion gap: 6 (ref 5–15)
BUN: 20 mg/dL (ref 6–20)
CO2: 19 mmol/L — ABNORMAL LOW (ref 22–32)
Calcium: 7.6 mg/dL — ABNORMAL LOW (ref 8.9–10.3)
Chloride: 111 mmol/L (ref 98–111)
Creatinine, Ser: 0.68 mg/dL (ref 0.44–1.00)
GFR calc Af Amer: >60 mL/min (ref >60)
GFR calc non Af Amer: >60 mL/min (ref >60)
Glucose, Bld: 86 mg/dL (ref 70–99)
Potassium: 3.7 mmol/L (ref 3.5–5.1)
Sodium: 136 mmol/L (ref 135–145)

## 2019-04-08 LAB — BILIRUBIN, FRACTIONATED(TOT/DIR/INDIR)
Bilirubin, Direct: 0.2 mg/dL (ref 0.0–0.2)
Indirect Bilirubin: 0.5 mg/dL (ref 0.3–0.9)
Total Bilirubin: 0.7 mg/dL (ref 0.3–1.2)

## 2019-04-08 LAB — NOVEL CORONAVIRUS, NAA (HOSP ORDER, SEND-OUT TO REF LAB; TAT 18-24 HRS): SARS-CoV-2, NAA: NOT DETECTED

## 2019-04-08 LAB — MAGNESIUM: Magnesium: 1.7 mg/dL (ref 1.7–2.4)

## 2019-04-08 NOTE — Plan of Care (Signed)
Hypotension r/t severe diarrhea and dehydration Pt. Given 1L bolus and encouraged to increase oral intake. Pt is drinking more but unable to eat as it exacerbates diarrhea

## 2019-04-08 NOTE — Discharge Summary (Signed)
Physician Discharge Summary  Stacy [name] [id number] DOB: [birth year] DOA: 04/07/2019  PCP: Colon Branch, MD  Admit date: 04/07/2019 Discharge date: 04/08/2019  Admitted From: Home Disposition: Home  Recommendations for Outpatient Follow-up:  1. Follow up with PCP in 1-2 weeks 2. Please obtain BMP/CBC in one week 3. Please follow up on the following pending results:  Home Health: None Equipment/Devices: None  Discharge Condition: Stable CODE STATUS: Full code Diet recommendation: Previous home diet  Subjective: Patient seen and examined.  She has no complaints.  She states that she feels much better.  Diarrhea improved.  No more dizziness.  HPI: Stacy [name] is a 38 y.o. female with medical history significant of chronic intestinal pseudoobstruction, chronic nausea/vomiting, anxiety, and GERD; who presents after having a syncopal episode at home.  Patient reports that she had been started on Motegrity about a month ago in addition to MiraLAX twice daily by her GI doctor at [facility].  However, since being on both patient reports having multiple bowel movements per day.  She had developed epigastric abdominal pain and then started having nausea and vomiting.  She tried taking Pepcid and Zofran at home without relief of symptoms.  Patient reports that this is happened several times in the past during IV fluids.  Denies having any fever, dysuria, blood in stool, or chest pain.  However, this morning when she had gone to use the restroom the next thing that she recalls was waking up on the bed with her husband standing over her.  Initially he was going to drive her to the emergency department, but she felt like she was going to pass out again and called EMS.  Patient reports that her baseline systolic blood pressure is around 90.  She also has a follow-up appointment with her gastroenterologist by telemedicine next week to discuss her medications. En route with EMS patient was given 1000 mL of  normal saline IV fluids and 4 mg of Zofran IV.    ED Course: On admission into the emergency department patient was noted to be afebrile, pulse 93-1 04, blood pressure as low 80/46 improved with IV fluids to 109/68, and all other vital signs maintained.  Labs revealed WBC 10.9, hemoglobin 11.5, BUN 24, creatinine 1.13 (baseline 0.5).  Urinalysis positive for moderate leukocytes, positive nitrites, 21- 50 squamous epithelial cells, 21- 50  WBCs.,  And many bacteria.  She was ordered 2 L of normal saline IV fluids, Pepcid, fentanyl 50 mcg, and Rocephin IV.  TRH called to   Brief/Interim Summary: Patient was admitted for orthostatic hypotension which was likely the cause of syncope as well as AKI which was prerenal secondary to dehydration.  She was provided with IV fluids.  Patient felt better.  AKI resolved.  Did not have any dizziness.  No more hypotension.  Blood pressure at baseline.  Patient wants to go home and she is stable so she will be discharged.  Discharge Diagnoses:  Principal Problem:   Syncope Active Problems:   Orthostatic hypotension   Chronic intestinal pseudo-obstruction   GERD (gastroesophageal reflux disease)   Acute lower UTI   Normocytic anemia   AKI (acute kidney injury) ([place])   Syncope due to orthostatic hypotension    Discharge Instructions  Discharge Instructions    Discharge patient   Complete by: As directed    Discharge disposition: 01-Home or Self Care   Discharge patient date: 04/08/2019     Allergies as of 04/08/2019      Reactions  Tetracyclines & Related Swelling   Swelling in spine; required spinal tap.   Erythromycin Other (See Comments)   unknown   Raspberry Rash   Sulfonamide Derivatives Rash      Medication List    TAKE these medications   calcium carbonate 200 MG capsule Take 250 mg by mouth 2 (two) times daily with a meal.   iron polysaccharides 150 MG capsule Commonly known as: Ferrex 150 Take 1 capsule (150 mg total) by mouth 2  (two) times daily.   Motegrity 2 MG Tabs Generic drug: Prucalopride Succinate Take 2 mg by mouth daily.   omeprazole 40 MG capsule Commonly known as: PRILOSEC Take 1 capsule (40 mg total) by mouth 2 (two) times daily before a meal.   ondansetron 4 MG disintegrating tablet Commonly known as: Zofran ODT Take 1 tablet (4 mg total) by mouth every 8 (eight) hours as needed for nausea or vomiting.   polyethylene glycol 17 g packet Commonly known as: MIRALAX / GLYCOLAX Take 17 g by mouth 2 (two) times daily.   Vitamin D (Cholecalciferol) 25 MCG (1000 UT) Tabs Take 2,000 Units by mouth daily.      Follow-up [place], MD Follow up in 1 week(s).   Specialty: Internal Medicine Contact information: [address] [place] [address] [phone]          Allergies  Allergen Reactions  . Tetracyclines & Related Swelling    Swelling in spine; required spinal tap.   . Erythromycin Other (See Comments)    unknown  . Raspberry Rash  . Sulfonamide Derivatives Rash    Consultations: None   Procedures/Studies:  No results found.   Discharge Exam: Vitals:   04/07/19 2358 04/08/19 0437  BP: (!) 84/53 92/61  Pulse: 90 90  Resp: 18 16  Temp: 98.9 F (37.2 C) 97.8 F (36.6 C)  SpO2: 99% 98%   Vitals:   04/07/19 2000 04/07/19 2153 04/07/19 2358 04/08/19 0437  BP:  (!) 79/58 (!) 84/53 92/61  Pulse:  (!) 107 90 90  Resp:   18 16  Temp: 98.8 F (37.1 C)  98.9 F (37.2 C) 97.8 F (36.6 C)  TempSrc: Oral  Oral Oral  SpO2: 100% 98% 99% 98%  Weight:    42.9 kg  Height:        General: Pt is alert, awake, not in acute distress Cardiovascular: RRR, S1/S2 +, no rubs, no gallops Respiratory: CTA bilaterally, no wheezing, no rhonchi Abdominal: Soft, NT, ND, bowel sounds + Extremities: no edema, no cyanosis    The results of significant diagnostics from this hospitalization (including imaging, microbiology, ancillary and laboratory)  are listed below for reference.     Microbiology: No results found for this or any previous visit (from the past 240 hour(s)).   Labs: BNP (last 3 results) No results for input(s): BNP in the last 8760 hours. Basic Metabolic Panel: Recent Labs  Lab 04/07/19 1210 04/08/19 0433  NA 137 136  K 5.0 3.7  CL 106 111  CO2 21* 19*  GLUCOSE 95 86  BUN 24* 20  CREATININE 1.13* 0.68  CALCIUM 8.6* 7.6*  MG  --  1.7   Liver Function Tests: Recent Labs  Lab 04/07/19 1210 04/08/19 0433  AST 15  --   ALT 9  --   ALKPHOS 44  --   BILITOT 1.7* 0.7  PROT 6.1*  --   ALBUMIN 3.6  --  Recent Labs  Lab 04/07/19 1210  LIPASE 26   No results for input(s): AMMONIA in the last 168 hours. CBC: Recent Labs  Lab 04/07/19 1210 04/08/19 0433  WBC 10.9* 6.7  NEUTROABS 9.6*  --   HGB 11.5* 8.6*  HCT 34.4* 26.0*  MCV 90.3 91.5  PLT 257 204   Cardiac Enzymes: No results for input(s): CKTOTAL, CKMB, CKMBINDEX, TROPONINI in the last 168 hours. BNP: Invalid input(s): POCBNP CBG: No results for input(s): GLUCAP in the last 168 hours. D-Dimer No results for input(s): DDIMER in the last 72 hours. Hgb A1c No results for input(s): HGBA1C in the last 72 hours. Lipid Profile No results for input(s): CHOL, HDL, LDLCALC, TRIG, CHOLHDL, LDLDIRECT in the last 72 hours. Thyroid function studies No results for input(s): TSH, T4TOTAL, T3FREE, THYROIDAB in the last 72 hours.  Invalid input(s): FREET3 Anemia work up No results for input(s): VITAMINB12, FOLATE, FERRITIN, TIBC, IRON, RETICCTPCT in the last 72 hours. Urinalysis    Component Value Date/Time   COLORURINE AMBER (A) 04/07/2019 1411   APPEARANCEUR CLOUDY (A) 04/07/2019 1411   LABSPEC 1.020 04/07/2019 1411   PHURINE 5.0 04/07/2019 1411   GLUCOSEU NEGATIVE 04/07/2019 1411   HGBUR MODERATE (A) 04/07/2019 1411   BILIRUBINUR NEGATIVE 04/07/2019 1411   KETONESUR NEGATIVE 04/07/2019 1411   PROTEINUR 30 (A) 04/07/2019 1411    UROBILINOGEN 0.2 09/13/2013 0322   NITRITE POSITIVE (A) 04/07/2019 1411   LEUKOCYTESUR MODERATE (A) 04/07/2019 1411   Sepsis Labs Invalid input(s): PROCALCITONIN,  WBC,  LACTICIDVEN Microbiology No results found for this or any previous visit (from the past 240 hour(s)).   Time coordinating discharge: < 30 minutes  SIGNED:   Darliss Cheney, MD  [facility] 04/08/2019, 10:07 AM Pager [phone]  If 7PM-7AM, please contact night-coverage [email or web address] Password [email or web address]

## 2019-04-08 NOTE — Discharge Instructions (Signed)
Syncope °Syncope is when you pass out (faint) for a short time. It is caused by a sudden decrease in blood flow to the brain. Signs that you may be about to pass out include: °· Feeling dizzy or light-headed. °· Feeling sick to your stomach (nauseous). °· Seeing all white or all black. °· Having cold, clammy skin. °If you pass out, get help right away. Call your local emergency services (911 in the [place] Do not drive yourself to the hospital. °Follow these instructions at home: °Watch for any changes in your symptoms. Take these actions to stay safe and help with your symptoms: °Lifestyle °· Do not drive, use machinery, or play sports until your doctor says it is okay. °· Do not drink alcohol. °· Do not use any products that contain nicotine or tobacco, such as cigarettes and e-cigarettes. If you need help quitting, ask your doctor. °· Drink enough fluid to keep your pee (urine) pale yellow. °General instructions °· Take over-the-counter and prescription medicines only as told by your doctor. °· If you are taking blood pressure or heart medicine, sit up and stand up slowly. Spend a few minutes getting ready to sit and then stand. This can help you feel less dizzy. °· Have someone stay with you until you feel stable. °· If you start to feel like you might pass out, lie down right away and raise (elevate) your feet above the level of your heart. Breathe deeply and steadily. Wait until all of the symptoms are gone. °· Keep all follow-up visits as told by your doctor. This is important. °Get help right away if: °· You have a very bad headache. °· You pass out once or more than once. °· You have pain in your chest, belly, or back. °· You have a very fast or uneven heartbeat (palpitations). °· It hurts to breathe. °· You are bleeding from your mouth or your bottom (rectum). °· You have black or tarry poop (stool). °· You have jerky movements that you cannot control (seizure). °· You are confused. °· You have trouble  walking. °· You are very weak. °· You have vision problems. °These symptoms may be an emergency. Do not wait to see if the symptoms will go away. Get medical help right away. Call your local emergency services (911 in the [place] Do not drive yourself to the hospital. °Summary °· Syncope is when you pass out (faint) for a short time. It is caused by a sudden decrease in blood flow to the brain. °· Signs that you may be about to faint include feeling dizzy, light-headed, or sick to your stomach, seeing all white or all black, or having cold, clammy skin. °· If you start to feel like you might pass out, lie down right away and raise (elevate) your feet above the level of your heart. Breathe deeply and steadily. Wait until all of the symptoms are gone. °This information is not intended to replace advice given to you by your health care provider. Make sure you discuss any questions you have with your health care provider. °Document Released: 01/19/2008 Document Revised: 09/14/2017 Document Reviewed: 09/14/2017 °Elsevier Patient Education © 2020 [employer] ° °

## 2019-04-08 NOTE — Progress Notes (Signed)
Reviewed all d/c instructions with patient and copy given, verbalized understanding.  Patient awaiting family for d/c via [facility].

## 2019-04-09 ENCOUNTER — Ambulatory Visit: Payer: 59 | Admitting: Internal Medicine

## 2019-04-09 ENCOUNTER — Other Ambulatory Visit: Payer: Self-pay

## 2019-04-09 ENCOUNTER — Encounter: Payer: Self-pay | Admitting: Internal Medicine

## 2019-04-09 VITALS — BP 103/66 | HR 104 | Temp 98.3°F | Resp 18 | Ht 63.0 in | Wt 96.2 lb

## 2019-04-09 DIAGNOSIS — E539 Vitamin B deficiency, unspecified: Secondary | ICD-10-CM | POA: Diagnosis not present

## 2019-04-09 DIAGNOSIS — K598 Other specified functional intestinal disorders: Secondary | ICD-10-CM

## 2019-04-09 DIAGNOSIS — E860 Dehydration: Secondary | ICD-10-CM

## 2019-04-09 DIAGNOSIS — D509 Iron deficiency anemia, unspecified: Secondary | ICD-10-CM | POA: Diagnosis not present

## 2019-04-09 DIAGNOSIS — K5989 Other specified functional intestinal disorders: Secondary | ICD-10-CM

## 2019-04-09 MED ORDER — CYANOCOBALAMIN 1000 MCG/ML IJ SOLN
1000.0000 ug | Freq: Once | INTRAMUSCULAR | Status: AC
  Administered 2019-04-09: 15:00:00 1000 ug via INTRAMUSCULAR

## 2019-04-09 NOTE — Patient Instructions (Addendum)
GO TO THE LAB : Get the blood work     See you in April

## 2019-04-09 NOTE — Progress Notes (Signed)
Pre visit review using our clinic review tool, if applicable. No additional management support is needed unless otherwise documented below in the visit note. 

## 2019-04-09 NOTE — Progress Notes (Signed)
Subjective:    Patient ID: Stacy [name], female    DOB: 11-Mar-1981, 38 y.o.   MRN: [id number]  DOS:  04/09/2019 Type of visit - description: Hospital follow-up Admitted to hospital and discharge 04/08/2019.  She was admitted due to syncopal episode in the context of frequent bowel movements since she started Motegrity and MiraLAX. She also had abdominal pain, nausea and vomiting. At the ER, she was afebrile, BP was in the low side but improved with IV fluids.  Also she was orthostatic. Hemoglobin 11.5, creatinine 1.1 (baseline 0.5) Hemoglobin initially 11.5, subsequently 8.6 after IV fluids Urinalysis consistent with a UTI, she received Rocephin IV.  She improved and eventually was discharged home   Review of Systems She went home yesterday, since then she is feeling better, energy is coming back. Today she was able to eat cereal  Anda sandwich, she is drinking plenty of fluids.  Since she left the hospital and was given Imodium, she has no BMs so far  No  blood in the stools.  Her periods are heavy often times, frequency varies from once a month to twice a month.  A urine culture came back positive. Prior to admission, she had no LUTS & remains asx.  Past Medical History:  Diagnosis Date  . Anxiety   . Benign liver cyst   . Chronic intestinal pseudo-obstruction   . GERD (gastroesophageal reflux disease)   . History of UTI     Past Surgical History:  Procedure Laterality Date  . CESAREAN SECTION     twice 2010, 2013  . Esure  ~2015    Social History   Socioeconomic History  . Marital status: Married    Spouse name: Not on file  . Number of children: 2  . Years of education: Not on file  . Highest education level: Not on file  Occupational History  . Occupation: stay home ; associate degree paralega   Social Needs  . Financial resource strain: Not hard at all  . Food insecurity    Worry: Patient refused    Inability: Patient refused  . Transportation  needs    Medical: Patient refused    Non-medical: Patient refused  Tobacco Use  . Smoking status: Never Smoker  . Smokeless tobacco: Never Used  Substance and Sexual Activity  . Alcohol use: No    Comment: socially   . Drug use: No  . Sexual activity: Yes    Birth control/protection: None  Lifestyle  . Physical activity    Days per week: Patient refused    Minutes per session: Patient refused  . Stress: Only a little  Relationships  . Social Herbalist on phone: Not on file    Gets together: Not on file    Attends religious service: Not on file    Active member of club or organization: Not on file    Attends meetings of clubs or organizations: Not on file    Relationship status: Not on file  . Intimate partner violence    Fear of current or ex partner: Patient refused    Emotionally abused: Patient refused    Physically abused: Patient refused    Forced sexual activity: Patient refused  Other Topics Concern  . Not on file  Social History Narrative   Household: pt, husband , 2 children   2 boys: 2010, 2013   P2G2   Original  from [place], no foreign trips      Allergies as  of 04/09/2019      Reactions   Tetracyclines & Related Swelling   Swelling in spine; required spinal tap.   Erythromycin Other (See Comments)   unknown   Raspberry Rash   Sulfonamide Derivatives Rash      Medication List       Accurate as of April 09, 2019  2:11 PM. If you have any questions, ask your nurse or doctor.        calcium carbonate 200 MG capsule Take 250 mg by mouth 2 (two) times daily with a meal.   iron polysaccharides 150 MG capsule Commonly known as: Ferrex 150 Take 1 capsule (150 mg total) by mouth 2 (two) times daily.   Motegrity 2 MG Tabs Generic drug: Prucalopride Succinate Take 2 mg by mouth daily.   omeprazole 40 MG capsule Commonly known as: PRILOSEC Take 1 capsule (40 mg total) by mouth 2 (two) times daily before a meal.   ondansetron 4 MG  disintegrating tablet Commonly known as: Zofran ODT Take 1 tablet (4 mg total) by mouth every 8 (eight) hours as needed for nausea or vomiting.   polyethylene glycol 17 g packet Commonly known as: MIRALAX / GLYCOLAX Take 17 g by mouth 2 (two) times daily.   Vitamin D (Cholecalciferol) 25 MCG (1000 UT) Tabs Take 2,000 Units by mouth daily.           Objective:   Physical Exam BP 103/66 (BP Location: Left Arm, Patient Position: Sitting, Cuff Size: Small)   Pulse (!) 104   Temp 98.3 F (36.8 C) (Temporal)   Resp 18   Ht 5\' 3"  (1.6 m)   Wt 96 lb 4 oz (43.7 kg)   LMP 03/24/2019 (Exact Date)   SpO2 100%   BMI 17.05 kg/m  General:   Well developed, NAD, BMI noted.  HEENT:  Normocephalic . Face symmetric, atraumatic Lungs:  CTA B Normal respiratory effort, no intercostal retractions, no accessory muscle use. Heart: RRR,  no murmur.  no pretibial edema bilaterally  Abdomen:  Not distended, soft, non-tender. No rebound or rigidity.   Skin: Not pale. Not jaundice Neurologic:  alert & oriented X3.  Speech normal, gait appropriate for age and unassisted Psych--  Cognition and judgment appear intact.  Cooperative with normal attention span and concentration.  Behavior appropriate. No anxious or depressed appearing.     Assessment    ASSESSMENT  Underweight  Per pt:Wt when she finished high school : 120 pounds.  After her second pregnancy in 2013 her weight was 140 pounds. Since 2013 she started to lose weight steadily, the smallest she has been is 85 pounds. 08-2015: 91 pounds.  05-2016 90 pounds  GERD Benign liver cyst  H/o anxiety Gyn: Dr Rogue Bussing @ [facility]  GI:  -EGD 02/14/2014: Duodenum biopsy negative.  No Giardia, no sprue changes.  Stomach: Congested and erythematous on the prepyloric region, BX: no H. pylori, + reactive gastropathy. -Colonoscopy 02/14/2014: one polyp,Bx, Granulation tissue polyp -03/2014: Barium enema normal -Abnormal liver imaging: CT  01/2016, ultrasound 08-2016 >>   MRI 08-2016 see report; MRI 7/17 /2020 --Chronic intestinal pseudo-obstruction DX 05-2018 in [place].  Felt to be the cause of weight loss. -Follow-up at [facility] as of 02-2019 S/p ESURE 2015  PLAN Dehydration: In the context of frequent BMs felt to be due to taking Motegrity and MiraLAX together, resolved. Chronic interstitial pseudo-obstruction: She already talk with GI this morning, and they agreed to stop MiraLAX and continue Motegrity.  Check a BMP.  Anemia: Upon arrival to the hospital, she was dehydrated,Hemoglobin was 11.5, after IV fluids it dropped to 8.6.  There might be some dilusional component of her anemia but also she has heavy periods sometimes twice a month.  She is currently taking iron. Had a colonoscopy last year. Plan: Check a CBC, iron, ferritin UTI: Urine culture taken at the hospital showing E. coli, she has no symptoms.  She received IV Rocephin at the hospital.  No further treatment. Addendum: After the visit she sent a message stating that she has symptoms consequently she was prescribed Macrobid. B12 deficiency: Shot today RTC as a schedule in few months.

## 2019-04-10 ENCOUNTER — Other Ambulatory Visit: Payer: Self-pay | Admitting: Internal Medicine

## 2019-04-10 LAB — CBC WITH DIFFERENTIAL/PLATELET
Basophils Absolute: 0 10*3/uL (ref 0.0–0.1)
Basophils Relative: 0.7 % (ref 0.0–3.0)
Eosinophils Absolute: 0 10*3/uL (ref 0.0–0.7)
Eosinophils Relative: 0.3 % (ref 0.0–5.0)
HCT: 29.9 % — ABNORMAL LOW (ref 36.0–46.0)
Hemoglobin: 10.3 g/dL — ABNORMAL LOW (ref 12.0–15.0)
Lymphocytes Relative: 24.3 % (ref 12.0–46.0)
Lymphs Abs: 1.2 10*3/uL (ref 0.7–4.0)
MCHC: 34.4 g/dL (ref 30.0–36.0)
MCV: 88.6 fl (ref 78.0–100.0)
Monocytes Absolute: 0.4 10*3/uL (ref 0.1–1.0)
Monocytes Relative: 8.1 % (ref 3.0–12.0)
Neutro Abs: 3.3 10*3/uL (ref 1.4–7.7)
Neutrophils Relative %: 66.6 % (ref 43.0–77.0)
Platelets: 270 10*3/uL (ref 150.0–400.0)
RBC: 3.38 Mil/uL — ABNORMAL LOW (ref 3.87–5.11)
RDW: 12.4 % (ref 11.5–15.5)
WBC: 4.9 10*3/uL (ref 4.0–10.5)

## 2019-04-10 LAB — BASIC METABOLIC PANEL
BUN: 9 mg/dL (ref 6–23)
CO2: 26 mEq/L (ref 19–32)
Calcium: 8.4 mg/dL (ref 8.4–10.5)
Chloride: 108 mEq/L (ref 96–112)
Creatinine, Ser: 0.51 mg/dL (ref 0.40–1.20)
GFR: 134.19 mL/min (ref >60.00)
Glucose, Bld: 120 mg/dL — ABNORMAL HIGH (ref 70–99)
Potassium: 3.7 mEq/L (ref 3.5–5.1)
Sodium: 141 mEq/L (ref 135–145)

## 2019-04-10 LAB — URINE CULTURE: Culture: >=100000 — AB

## 2019-04-10 LAB — IRON: Iron: 46 ug/dL (ref 42–145)

## 2019-04-10 LAB — FERRITIN: Ferritin: 103.9 ng/mL (ref 10.0–291.0)

## 2019-04-10 MED ORDER — NITROFURANTOIN MONOHYD MACRO 100 MG PO CAPS: 100.0000 mg | ORAL_CAPSULE | Freq: Two times a day (BID) | ORAL | 0 refills | Status: DC

## 2019-04-11 NOTE — Assessment & Plan Note (Signed)
Dehydration: In the context of frequent BMs felt to be due to taking Motegrity and MiraLAX together, resolved. Chronic interstitial pseudo-obstruction: She already talk with GI this morning, and they agreed to stop MiraLAX and continue Motegrity.  Check a BMP. Anemia: Upon arrival to the hospital, she was dehydrated,Hemoglobin was 11.5, after IV fluids it dropped to 8.6.  There might be some dilusional component of her anemia but also she has heavy periods sometimes twice a month.  She is currently taking iron. Had a colonoscopy last year. Plan: Check a CBC, iron, ferritin UTI: Urine culture taken at the hospital showing E. coli, she has no symptoms.  She received IV Rocephin at the hospital.  No further treatment. Addendum: After the visit she sent a message stating that she has symptoms consequently she was prescribed Macrobid. B12 deficiency: Shot today RTC as a schedule in few months.

## 2019-04-12 ENCOUNTER — Encounter: Payer: Self-pay | Admitting: Internal Medicine

## 2019-04-12 ENCOUNTER — Telehealth: Payer: Self-pay | Admitting: Internal Medicine

## 2019-04-12 NOTE — Telephone Encounter (Signed)
Please advise 

## 2019-04-12 NOTE — Telephone Encounter (Signed)
Results faxed.

## 2019-04-12 NOTE — Telephone Encounter (Signed)
Please fax results

## 2019-04-12 NOTE — Telephone Encounter (Signed)
Stacy [name] is calling from [facility] to request the patients lab results from Monday 04/09/2019 [phone]

## 2019-04-13 ENCOUNTER — Ambulatory Visit: Payer: 59

## 2019-04-22 ENCOUNTER — Other Ambulatory Visit: Payer: Self-pay

## 2019-04-22 ENCOUNTER — Emergency Department (HOSPITAL_BASED_OUTPATIENT_CLINIC_OR_DEPARTMENT_OTHER): Payer: 59

## 2019-04-22 ENCOUNTER — Encounter (HOSPITAL_BASED_OUTPATIENT_CLINIC_OR_DEPARTMENT_OTHER): Payer: Self-pay | Admitting: *Deleted

## 2019-04-22 ENCOUNTER — Emergency Department (HOSPITAL_BASED_OUTPATIENT_CLINIC_OR_DEPARTMENT_OTHER)
Admission: EM | Admit: 2019-04-22 | Discharge: 2019-04-22 | Disposition: A | Discharge status: Home / Self Care | Payer: 59 | Source: Home / Self Care | Attending: Emergency Medicine | Admitting: Emergency Medicine

## 2019-04-22 DIAGNOSIS — K5900 Constipation, unspecified: Secondary | ICD-10-CM

## 2019-04-22 DIAGNOSIS — K598 Other specified functional intestinal disorders: Secondary | ICD-10-CM | POA: Diagnosis not present

## 2019-04-22 DIAGNOSIS — R109 Unspecified abdominal pain: Secondary | ICD-10-CM

## 2019-04-22 LAB — CBC
HCT: 33.7 % — ABNORMAL LOW (ref 36.0–46.0)
Hemoglobin: 11 g/dL — ABNORMAL LOW (ref 12.0–15.0)
MCH: 29.6 pg (ref 26.0–34.0)
MCHC: 32.6 g/dL (ref 30.0–36.0)
MCV: 90.6 fL (ref 80.0–100.0)
Platelets: 311 10*3/uL (ref 150–400)
RBC: 3.72 MIL/uL — ABNORMAL LOW (ref 3.87–5.11)
RDW: 12.1 % (ref 11.5–15.5)
WBC: 6.3 10*3/uL (ref 4.0–10.5)
nRBC: 0 % (ref 0.0–0.2)

## 2019-04-22 LAB — LIPASE, BLOOD: Lipase: 31 U/L (ref 11–51)

## 2019-04-22 LAB — COMPREHENSIVE METABOLIC PANEL
ALT: 7 U/L (ref 0–44)
AST: 9 U/L — ABNORMAL LOW (ref 15–41)
Albumin: 3.7 g/dL (ref 3.5–5.0)
Alkaline Phosphatase: 45 U/L (ref 38–126)
Anion gap: 8 (ref 5–15)
BUN: 17 mg/dL (ref 6–20)
CO2: 26 mmol/L (ref 22–32)
Calcium: 8.9 mg/dL (ref 8.9–10.3)
Chloride: 102 mmol/L (ref 98–111)
Creatinine, Ser: 0.56 mg/dL (ref 0.44–1.00)
GFR calc Af Amer: >60 mL/min (ref >60)
GFR calc non Af Amer: >60 mL/min (ref >60)
Glucose, Bld: 122 mg/dL — ABNORMAL HIGH (ref 70–99)
Potassium: 4.2 mmol/L (ref 3.5–5.1)
Sodium: 136 mmol/L (ref 135–145)
Total Bilirubin: 0.6 mg/dL (ref 0.3–1.2)
Total Protein: 6.3 g/dL — ABNORMAL LOW (ref 6.5–8.1)

## 2019-04-22 MED ORDER — FENTANYL CITRATE (PF) 100 MCG/2ML IJ SOLN
25.0000 ug | Freq: Once | INTRAMUSCULAR | Status: AC
  Administered 2019-04-22: 25 ug via INTRAVENOUS
  Filled 2019-04-22: qty 2

## 2019-04-22 MED ORDER — SODIUM CHLORIDE 0.9 % IV BOLUS
1000.0000 mL | Freq: Once | INTRAVENOUS | Status: AC
  Administered 2019-04-22: 1000 mL via INTRAVENOUS

## 2019-04-22 MED ORDER — DICYCLOMINE HCL 20 MG PO TABS: 20.0000 mg | ORAL_TABLET | Freq: Two times a day (BID) | ORAL | 0 refills | Status: DC

## 2019-04-22 NOTE — ED Triage Notes (Addendum)
Hx of psuedo obstruction. States she has changed her GI medications this week and is now having decreased BM and crampy abd pain. She is followed by [facility]

## 2019-04-22 NOTE — Discharge Instructions (Addendum)
You were evaluated in the Emergency Department and after careful evaluation, we did not find any emergent condition requiring admission or further testing in the hospital.  Your exam/testing today was overall reassuring.  There is evidence of some constipation on your x-ray today.  Please discuss your MiraLAX dosing with your GI doctor.  For discomfort at home, you can try the Bentyl medication provided.  Please return to the Emergency Department if you experience any worsening of your condition.  We encourage you to follow up with a primary care provider.  Thank you for allowing [place] to be a part of your care.

## 2019-04-22 NOTE — ED Notes (Signed)
Pt sts [id number] systolic BP is normal for her. She is asymptomatic at this time, sitting up in bed, in NAD.

## 2019-04-22 NOTE — ED Provider Notes (Signed)
[facility] Emergency Department Provider Note MRN:  [id number]  Arrival date & time: 04/22/19     Chief Complaint   Abdominal Pain   History of Present Illness   Stacy [name] is a 38 y.o. year-old female with a history of chronic intestinal pseudoobstruction presenting to the ED with chief complaint of abdominal pain.  Worsening abdominal pain for the past 3 to 4 days.  History of chronic abdominal pain.  Had recent admission for pain, nausea vomiting causing acute kidney injury.  Having infrequent bowel movements.  Denies fever, no chest pain or shortness of breath, pain in the abdomen is located in the lower quadrants bilaterally.  Denies vaginal bleeding or discharge.  Review of Systems  A complete 10 system review of systems was obtained and all systems are negative except as noted in the HPI and PMH.   Patient's Health History    Past Medical History:  Diagnosis Date  . Anxiety   . Benign liver cyst   . Chronic intestinal pseudo-obstruction   . GERD (gastroesophageal reflux disease)   . History of UTI     Past Surgical History:  Procedure Laterality Date  . CESAREAN SECTION     twice 2010, 2013  . Esure  ~2015    Family History  Problem Relation Age of Onset  . CAD Father   . Hypertension Father   . Diabetes Father   . Diabetes Brother   . Colon cancer Neg Hx   . Breast cancer Neg Hx     Social History   Socioeconomic History  . Marital status: Married    Spouse name: Not on file  . Number of children: 2  . Years of education: Not on file  . Highest education level: Not on file  Occupational History  . Occupation: stay home ; associate degree paralega   Social Needs  . Financial resource strain: Not hard at all  . Food insecurity    Worry: Patient refused    Inability: Patient refused  . Transportation needs    Medical: Patient refused    Non-medical: Patient refused  Tobacco Use  . Smoking status: Never Smoker  .  Smokeless tobacco: Never Used  Substance and Sexual Activity  . Alcohol use: No    Comment: socially   . Drug use: No  . Sexual activity: Yes    Birth control/protection: None  Lifestyle  . Physical activity    Days per week: Patient refused    Minutes per session: Patient refused  . Stress: Only a little  Relationships  . Social Herbalist on phone: Not on file    Gets together: Not on file    Attends religious service: Not on file    Active member of club or organization: Not on file    Attends meetings of clubs or organizations: Not on file    Relationship status: Not on file  . Intimate partner violence    Fear of current or ex partner: Patient refused    Emotionally abused: Patient refused    Physically abused: Patient refused    Forced sexual activity: Patient refused  Other Topics Concern  . Not on file  Social History Narrative   Household: pt, husband , 2 children   2 boys: 2010, 2013   P2G2   Original  from [place], no foreign trips     Physical Exam  Vital Signs and Nursing Notes reviewed Vitals:   04/22/19 1820  04/22/19 1900  BP: 103/69 100/75  Pulse: 91 85  Resp: 16 15  Temp:    SpO2: 98% 98%    CONSTITUTIONAL: Well-appearing, NAD NEURO:  Alert and oriented x 3, no focal deficits EYES:  eyes equal and reactive ENT/NECK:  no LAD, no JVD CARDIO: Tachycardic rate, well-perfused, normal S1 and S2 PULM:  CTAB no wheezing or rhonchi GI/GU:  normal bowel sounds, non-distended, non-tender MSK/SPINE:  No gross deformities, no edema SKIN:  no rash, atraumatic PSYCH:  Appropriate speech and behavior  Diagnostic and Interventional Summary    EKG Interpretation  Date/Time:  Sunday April 22 2019 18:17:46 EDT Ventricular Rate:  89 PR Interval:    QRS Duration: 73 QT Interval:  325 QTC Calculation: 396 R Axis:   33 Text Interpretation:  Sinus rhythm Probable anteroseptal infarct, old Confirmed by ,  ([id number]) on 04/22/2019 7:10:41 PM       Labs Reviewed  CBC - Abnormal; Notable for the following components:      Result Value   RBC 3.72 (*)    Hemoglobin 11.0 (*)    HCT 33.7 (*)    All other components within normal limits  COMPREHENSIVE METABOLIC PANEL - Abnormal; Notable for the following components:   Glucose, Bld 122 (*)    Total Protein 6.3 (*)    AST 9 (*)    All other components within normal limits  LIPASE, BLOOD    DG ABD ACUTE 2+V W 1V CHEST  Final Result      Medications  sodium chloride 0.9 % bolus 1,000 mL (1,000 mLs Intravenous New Bag/Given 04/22/19 1809)  fentaNYL (SUBLIMAZE) injection 25 mcg (25 mcg Intravenous Given 04/22/19 1809)     Procedures Critical Care  ED Course and Medical Decision Making  I have reviewed the triage vital signs and the nursing notes.  Pertinent labs & imaging results that were available during my care of the patient were reviewed by me and considered in my medical decision making (see below for details).  Acute on chronic abdominal pain and constipation in this 38 year old female followed by GI.  She initially called GI but they would be unable to see her until Tuesday due to the holiday weekend.  She arrived tachycardic and with a systolic blood pressure in the 80s, however well-appearing and well perfused.  Vital signs have improved to normal, she is only 43 kg and her baseline blood pressures are typically lower.  Her laboratory assessment is reassuring, no recurrence of AKI.  Will attempt further symptomatic control at home.  Patient is feeling much better on repeat assessment, vital signs are normal.  Labs are reassuring.  Evidence of worsened constipation on plain film.  Repeat abdominal exam reassuring as well.  She is appropriate for discharge with GI follow-up, MiraLAX at home, Bentyl for discomfort.   M. , MD [facility] [email or web address]  Final Clinical Impressions(s) / ED Diagnoses      ICD-10-CM   1. Constipation, unspecified constipation type  K59.00   2. Abdominal pain  R10.9 DG ABD ACUTE 2+V W 1V CHEST    DG ABD ACUTE 2+V W 1V CHEST    ED Discharge Orders         Ordered    dicyclomine (BENTYL) 20 MG tablet  2 times daily     09 /06/20 1930          Discharge Instructions Discussed with and Provided to Patient:   Discharge Instructions  You were evaluated in the Emergency Department and after careful evaluation, we did not find any emergent condition requiring admission or further testing in the hospital.  Your exam/testing today was overall reassuring.  There is evidence of some constipation on your x-ray today.  Please discuss your MiraLAX dosing with your GI doctor.  For discomfort at home, you can try the Bentyl medication provided.  Please return to the Emergency Department if you experience any worsening of your condition.  We encourage you to follow up with a primary care provider.  Thank you for allowing [place] to be a part of your care.        Maudie Flakes, MD 04/22/19 [phone]

## 2019-04-24 ENCOUNTER — Inpatient Hospital Stay (HOSPITAL_BASED_OUTPATIENT_CLINIC_OR_DEPARTMENT_OTHER)
Admission: EM | Admit: 2019-04-24 | Discharge: 2019-04-26 | DRG: 391 | Disposition: A | Discharge status: Home / Self Care | Payer: 59 | Attending: Internal Medicine | Admitting: Internal Medicine

## 2019-04-24 ENCOUNTER — Encounter (HOSPITAL_BASED_OUTPATIENT_CLINIC_OR_DEPARTMENT_OTHER): Payer: Self-pay

## 2019-04-24 ENCOUNTER — Encounter (HOSPITAL_COMMUNITY): Payer: Self-pay | Admitting: Emergency Medicine

## 2019-04-24 ENCOUNTER — Emergency Department (HOSPITAL_COMMUNITY)
Admission: EM | Admit: 2019-04-24 | Discharge: 2019-04-24 | Discharge status: Against Medical Advice | Payer: 59 | Source: Home / Self Care

## 2019-04-24 ENCOUNTER — Other Ambulatory Visit: Payer: Self-pay

## 2019-04-24 DIAGNOSIS — R112 Nausea with vomiting, unspecified: Secondary | ICD-10-CM | POA: Diagnosis present

## 2019-04-24 DIAGNOSIS — R1030 Lower abdominal pain, unspecified: Secondary | ICD-10-CM

## 2019-04-24 DIAGNOSIS — Z20828 Contact with and (suspected) exposure to other viral communicable diseases: Secondary | ICD-10-CM | POA: Diagnosis present

## 2019-04-24 DIAGNOSIS — K5989 Other specified functional intestinal disorders: Secondary | ICD-10-CM | POA: Diagnosis present

## 2019-04-24 DIAGNOSIS — D649 Anemia, unspecified: Secondary | ICD-10-CM | POA: Diagnosis present

## 2019-04-24 DIAGNOSIS — F419 Anxiety disorder, unspecified: Secondary | ICD-10-CM | POA: Diagnosis present

## 2019-04-24 DIAGNOSIS — K7689 Other specified diseases of liver: Secondary | ICD-10-CM | POA: Diagnosis present

## 2019-04-24 DIAGNOSIS — Z91018 Allergy to other foods: Secondary | ICD-10-CM

## 2019-04-24 DIAGNOSIS — Z8249 Family history of ischemic heart disease and other diseases of the circulatory system: Secondary | ICD-10-CM

## 2019-04-24 DIAGNOSIS — Z5321 Procedure and treatment not carried out due to patient leaving prior to being seen by health care provider: Secondary | ICD-10-CM | POA: Insufficient documentation

## 2019-04-24 DIAGNOSIS — E878 Other disorders of electrolyte and fluid balance, not elsewhere classified: Secondary | ICD-10-CM | POA: Diagnosis present

## 2019-04-24 DIAGNOSIS — Z882 Allergy status to sulfonamides status: Secondary | ICD-10-CM

## 2019-04-24 DIAGNOSIS — E871 Hypo-osmolality and hyponatremia: Secondary | ICD-10-CM | POA: Diagnosis present

## 2019-04-24 DIAGNOSIS — Z681 Body mass index (BMI) 19 or less, adult: Secondary | ICD-10-CM

## 2019-04-24 DIAGNOSIS — K598 Other specified functional intestinal disorders: Principal | ICD-10-CM | POA: Diagnosis present

## 2019-04-24 DIAGNOSIS — Z79899 Other long term (current) drug therapy: Secondary | ICD-10-CM

## 2019-04-24 DIAGNOSIS — R1084 Generalized abdominal pain: Secondary | ICD-10-CM | POA: Diagnosis present

## 2019-04-24 DIAGNOSIS — E861 Hypovolemia: Secondary | ICD-10-CM | POA: Diagnosis present

## 2019-04-24 DIAGNOSIS — Z881 Allergy status to other antibiotic agents status: Secondary | ICD-10-CM

## 2019-04-24 DIAGNOSIS — E876 Hypokalemia: Secondary | ICD-10-CM | POA: Diagnosis present

## 2019-04-24 DIAGNOSIS — R109 Unspecified abdominal pain: Secondary | ICD-10-CM | POA: Diagnosis present

## 2019-04-24 DIAGNOSIS — E43 Unspecified severe protein-calorie malnutrition: Secondary | ICD-10-CM | POA: Diagnosis present

## 2019-04-24 DIAGNOSIS — Z87440 Personal history of urinary (tract) infections: Secondary | ICD-10-CM

## 2019-04-24 DIAGNOSIS — Z833 Family history of diabetes mellitus: Secondary | ICD-10-CM

## 2019-04-24 DIAGNOSIS — K5901 Slow transit constipation: Secondary | ICD-10-CM | POA: Diagnosis present

## 2019-04-24 DIAGNOSIS — K219 Gastro-esophageal reflux disease without esophagitis: Secondary | ICD-10-CM | POA: Diagnosis present

## 2019-04-24 LAB — CBC
HCT: 36.1 % (ref 36.0–46.0)
Hemoglobin: 12.3 g/dL (ref 12.0–15.0)
MCH: 30.1 pg (ref 26.0–34.0)
MCHC: 34.1 g/dL (ref 30.0–36.0)
MCV: 88.3 fL (ref 80.0–100.0)
Platelets: 332 10*3/uL (ref 150–400)
RBC: 4.09 MIL/uL (ref 3.87–5.11)
RDW: 11.9 % (ref 11.5–15.5)
WBC: 5.6 10*3/uL (ref 4.0–10.5)
nRBC: 0 % (ref 0.0–0.2)

## 2019-04-24 LAB — COMPREHENSIVE METABOLIC PANEL
ALT: 9 U/L (ref 0–44)
AST: 12 U/L — ABNORMAL LOW (ref 15–41)
Albumin: 4.1 g/dL (ref 3.5–5.0)
Alkaline Phosphatase: 43 U/L (ref 38–126)
Anion gap: 14 (ref 5–15)
BUN: 12 mg/dL (ref 6–20)
CO2: 22 mmol/L (ref 22–32)
Calcium: 9.1 mg/dL (ref 8.9–10.3)
Chloride: 96 mmol/L — ABNORMAL LOW (ref 98–111)
Creatinine, Ser: 0.54 mg/dL (ref 0.44–1.00)
GFR calc Af Amer: >60 mL/min (ref >60)
GFR calc non Af Amer: >60 mL/min (ref >60)
Glucose, Bld: 95 mg/dL (ref 70–99)
Potassium: 3.4 mmol/L — ABNORMAL LOW (ref 3.5–5.1)
Sodium: 132 mmol/L — ABNORMAL LOW (ref 135–145)
Total Bilirubin: 1 mg/dL (ref 0.3–1.2)
Total Protein: 7 g/dL (ref 6.5–8.1)

## 2019-04-24 LAB — LIPASE, BLOOD: Lipase: 33 U/L (ref 11–51)

## 2019-04-24 LAB — I-STAT BETA HCG BLOOD, ED (MC, WL, AP ONLY): I-stat hCG, quantitative: <5 m[IU]/mL (ref <5)

## 2019-04-24 MED ORDER — SODIUM CHLORIDE 0.9% FLUSH: 3.0000 mL | Freq: Once | INTRAVENOUS | Status: DC

## 2019-04-24 NOTE — ED Notes (Signed)
Pt's husband came to get pt, stated they are going to [place]

## 2019-04-24 NOTE — ED Triage Notes (Signed)
Pt c/o abdominal pain that started today. States hx of psuedo-obstruction. Pt was [facility] and left due to wait time. Did have blood work drawn.

## 2019-04-24 NOTE — ED Triage Notes (Signed)
Pt c/o "severe abdominal pain" x 2 weeks. States her doctors at [facility] prescribed "2 large bottles of gatorade and miralax" for constipation. Pt reports she became bloated and vomited.

## 2019-04-25 ENCOUNTER — Encounter (HOSPITAL_BASED_OUTPATIENT_CLINIC_OR_DEPARTMENT_OTHER): Payer: Self-pay | Admitting: Family Medicine

## 2019-04-25 ENCOUNTER — Emergency Department (HOSPITAL_BASED_OUTPATIENT_CLINIC_OR_DEPARTMENT_OTHER): Payer: 59

## 2019-04-25 DIAGNOSIS — R109 Unspecified abdominal pain: Secondary | ICD-10-CM | POA: Diagnosis not present

## 2019-04-25 DIAGNOSIS — Z8249 Family history of ischemic heart disease and other diseases of the circulatory system: Secondary | ICD-10-CM | POA: Diagnosis not present

## 2019-04-25 DIAGNOSIS — K5989 Other specified functional intestinal disorders: Secondary | ICD-10-CM | POA: Diagnosis present

## 2019-04-25 DIAGNOSIS — E876 Hypokalemia: Secondary | ICD-10-CM | POA: Diagnosis present

## 2019-04-25 DIAGNOSIS — K7689 Other specified diseases of liver: Secondary | ICD-10-CM | POA: Diagnosis present

## 2019-04-25 DIAGNOSIS — E871 Hypo-osmolality and hyponatremia: Secondary | ICD-10-CM | POA: Diagnosis present

## 2019-04-25 DIAGNOSIS — K598 Other specified functional intestinal disorders: Principal | ICD-10-CM

## 2019-04-25 DIAGNOSIS — Z881 Allergy status to other antibiotic agents status: Secondary | ICD-10-CM | POA: Diagnosis not present

## 2019-04-25 DIAGNOSIS — R1084 Generalized abdominal pain: Secondary | ICD-10-CM | POA: Diagnosis present

## 2019-04-25 DIAGNOSIS — R112 Nausea with vomiting, unspecified: Secondary | ICD-10-CM | POA: Diagnosis present

## 2019-04-25 DIAGNOSIS — Z87440 Personal history of urinary (tract) infections: Secondary | ICD-10-CM | POA: Diagnosis not present

## 2019-04-25 DIAGNOSIS — R1110 Vomiting, unspecified: Secondary | ICD-10-CM

## 2019-04-25 DIAGNOSIS — E861 Hypovolemia: Secondary | ICD-10-CM | POA: Diagnosis present

## 2019-04-25 DIAGNOSIS — K219 Gastro-esophageal reflux disease without esophagitis: Secondary | ICD-10-CM | POA: Diagnosis present

## 2019-04-25 DIAGNOSIS — Z79899 Other long term (current) drug therapy: Secondary | ICD-10-CM | POA: Diagnosis not present

## 2019-04-25 DIAGNOSIS — Z833 Family history of diabetes mellitus: Secondary | ICD-10-CM | POA: Diagnosis not present

## 2019-04-25 DIAGNOSIS — F419 Anxiety disorder, unspecified: Secondary | ICD-10-CM | POA: Diagnosis not present

## 2019-04-25 DIAGNOSIS — Z681 Body mass index (BMI) 19 or less, adult: Secondary | ICD-10-CM | POA: Diagnosis not present

## 2019-04-25 DIAGNOSIS — E43 Unspecified severe protein-calorie malnutrition: Secondary | ICD-10-CM | POA: Diagnosis present

## 2019-04-25 DIAGNOSIS — Z91018 Allergy to other foods: Secondary | ICD-10-CM | POA: Diagnosis not present

## 2019-04-25 DIAGNOSIS — Z882 Allergy status to sulfonamides status: Secondary | ICD-10-CM | POA: Diagnosis not present

## 2019-04-25 DIAGNOSIS — D649 Anemia, unspecified: Secondary | ICD-10-CM | POA: Diagnosis present

## 2019-04-25 DIAGNOSIS — K5901 Slow transit constipation: Secondary | ICD-10-CM | POA: Diagnosis present

## 2019-04-25 DIAGNOSIS — Z20828 Contact with and (suspected) exposure to other viral communicable diseases: Secondary | ICD-10-CM | POA: Diagnosis present

## 2019-04-25 DIAGNOSIS — E878 Other disorders of electrolyte and fluid balance, not elsewhere classified: Secondary | ICD-10-CM | POA: Diagnosis present

## 2019-04-25 LAB — URINALYSIS, ROUTINE W REFLEX MICROSCOPIC
Bilirubin Urine: NEGATIVE
Glucose, UA: NEGATIVE mg/dL
Ketones, ur: 15 mg/dL — AB
Nitrite: NEGATIVE
Protein, ur: 30 mg/dL — AB
Specific Gravity, Urine: 1.02 (ref 1.005–1.030)
pH: 7.5 (ref 5.0–8.0)

## 2019-04-25 LAB — CBC
HCT: 31.7 % — ABNORMAL LOW (ref 36.0–46.0)
Hemoglobin: 10.5 g/dL — ABNORMAL LOW (ref 12.0–15.0)
MCH: 30.2 pg (ref 26.0–34.0)
MCHC: 33.1 g/dL (ref 30.0–36.0)
MCV: 91.1 fL (ref 80.0–100.0)
Platelets: 249 10*3/uL (ref 150–400)
RBC: 3.48 MIL/uL — ABNORMAL LOW (ref 3.87–5.11)
RDW: 12.1 % (ref 11.5–15.5)
WBC: 6.3 10*3/uL (ref 4.0–10.5)
nRBC: 0 % (ref 0.0–0.2)

## 2019-04-25 LAB — COMPREHENSIVE METABOLIC PANEL
ALT: 7 U/L (ref 0–44)
AST: 8 U/L — ABNORMAL LOW (ref 15–41)
Albumin: 3.6 g/dL (ref 3.5–5.0)
Alkaline Phosphatase: 44 U/L (ref 38–126)
Anion gap: 6 (ref 5–15)
BUN: 19 mg/dL (ref 6–20)
CO2: 25 mmol/L (ref 22–32)
Calcium: 8.3 mg/dL — ABNORMAL LOW (ref 8.9–10.3)
Chloride: 105 mmol/L (ref 98–111)
Creatinine, Ser: 0.73 mg/dL (ref 0.44–1.00)
GFR calc Af Amer: >60 mL/min (ref >60)
GFR calc non Af Amer: >60 mL/min (ref >60)
Glucose, Bld: 117 mg/dL — ABNORMAL HIGH (ref 70–99)
Potassium: 3.6 mmol/L (ref 3.5–5.1)
Sodium: 136 mmol/L (ref 135–145)
Total Bilirubin: 1.3 mg/dL — ABNORMAL HIGH (ref 0.3–1.2)
Total Protein: 6.3 g/dL — ABNORMAL LOW (ref 6.5–8.1)

## 2019-04-25 LAB — URINALYSIS, MICROSCOPIC (REFLEX)

## 2019-04-25 LAB — GLUCOSE, CAPILLARY: Glucose-Capillary: 96 mg/dL (ref 70–99)

## 2019-04-25 LAB — SARS CORONAVIRUS 2 (TAT 6-24 HRS): SARS Coronavirus 2: NEGATIVE

## 2019-04-25 LAB — MAGNESIUM: Magnesium: 2 mg/dL (ref 1.7–2.4)

## 2019-04-25 LAB — HIV ANTIBODY (ROUTINE TESTING W REFLEX): HIV Screen 4th Generation wRfx: NONREACTIVE

## 2019-04-25 MED ORDER — SORBITOL 70 % SOLN
960.0000 mL | TOPICAL_OIL | Freq: Once | ORAL | Status: AC
  Administered 2019-04-25: 960 mL via RECTAL
  Filled 2019-04-25: qty 473

## 2019-04-25 MED ORDER — ONDANSETRON HCL 4 MG PO TABS: 4.0000 mg | ORAL_TABLET | Freq: Four times a day (QID) | ORAL | Status: DC | PRN

## 2019-04-25 MED ORDER — FENTANYL CITRATE (PF) 100 MCG/2ML IJ SOLN
50.0000 ug | Freq: Once | INTRAMUSCULAR | Status: AC
  Administered 2019-04-25: 50 ug via INTRAVENOUS
  Filled 2019-04-25: qty 2

## 2019-04-25 MED ORDER — PROMETHAZINE HCL 25 MG/ML IJ SOLN
12.5000 mg | Freq: Four times a day (QID) | INTRAMUSCULAR | Status: DC | PRN
  Administered 2019-04-25: 22:00:00 12.5 mg via INTRAVENOUS
  Filled 2019-04-25: qty 1

## 2019-04-25 MED ORDER — ENOXAPARIN SODIUM 40 MG/0.4ML ~~LOC~~ SOLN: 40.0000 mg | SUBCUTANEOUS | Status: DC

## 2019-04-25 MED ORDER — ENOXAPARIN SODIUM 40 MG/0.4ML ~~LOC~~ SOLN
40.0000 mg | SUBCUTANEOUS | Status: DC
  Administered 2019-04-25: 10:00:00 40 mg via SUBCUTANEOUS

## 2019-04-25 MED ORDER — HALOPERIDOL LACTATE 5 MG/ML IJ SOLN
2.0000 mg | Freq: Once | INTRAMUSCULAR | Status: AC
  Administered 2019-04-25: 01:00:00 2 mg via INTRAVENOUS
  Filled 2019-04-25: qty 1

## 2019-04-25 MED ORDER — KCL IN DEXTROSE-NACL 40-5-0.9 MEQ/L-%-% IV SOLN
INTRAVENOUS | Status: DC
  Administered 2019-04-25: 11:00:00 via INTRAVENOUS
  Filled 2019-04-25 (×5): qty 1000

## 2019-04-25 MED ORDER — SODIUM CHLORIDE 0.9 % IV BOLUS
1000.0000 mL | Freq: Once | INTRAVENOUS | Status: AC
  Administered 2019-04-25: 1000 mL via INTRAVENOUS

## 2019-04-25 MED ORDER — FENTANYL CITRATE (PF) 100 MCG/2ML IJ SOLN
INTRAMUSCULAR | Status: AC
  Administered 2019-04-25: 03:00:00 50 ug via INTRAVENOUS
  Filled 2019-04-25: qty 2

## 2019-04-25 MED ORDER — FENTANYL CITRATE (PF) 100 MCG/2ML IJ SOLN
50.0000 ug | Freq: Once | INTRAMUSCULAR | Status: AC
  Administered 2019-04-25: 03:00:00 50 ug via INTRAVENOUS

## 2019-04-25 MED ORDER — ACETAMINOPHEN 650 MG RE SUPP: 650.0000 mg | Freq: Four times a day (QID) | RECTAL | Status: DC | PRN

## 2019-04-25 MED ORDER — PANTOPRAZOLE SODIUM 40 MG IV SOLR
40.0000 mg | INTRAVENOUS | Status: DC
  Administered 2019-04-25 – 2019-04-26 (×2): 40 mg via INTRAVENOUS
  Filled 2019-04-25 (×2): qty 40

## 2019-04-25 MED ORDER — ONDANSETRON HCL 4 MG/2ML IJ SOLN
4.0000 mg | Freq: Four times a day (QID) | INTRAMUSCULAR | Status: DC | PRN
  Administered 2019-04-25: 05:00:00 4 mg via INTRAVENOUS
  Filled 2019-04-25: qty 2

## 2019-04-25 MED ORDER — PANTOPRAZOLE SODIUM 40 MG IV SOLR: 40.0000 mg | INTRAVENOUS | Status: DC

## 2019-04-25 MED ORDER — ACETAMINOPHEN 325 MG PO TABS: 650.0000 mg | ORAL_TABLET | Freq: Four times a day (QID) | ORAL | Status: DC | PRN

## 2019-04-25 MED ORDER — ENOXAPARIN SODIUM 300 MG/3ML IJ SOLN
20.0000 mg | INTRAMUSCULAR | Status: DC
  Administered 2019-04-26: 10:00:00 20 mg via SUBCUTANEOUS
  Filled 2019-04-25: qty 0.2

## 2019-04-25 MED ORDER — IOHEXOL 300 MG/ML  SOLN
100.0000 mL | Freq: Once | INTRAMUSCULAR | Status: AC
  Administered 2019-04-25: 50 mL via INTRAVENOUS

## 2019-04-25 MED ORDER — FENTANYL CITRATE (PF) 100 MCG/2ML IJ SOLN
25.0000 ug | INTRAMUSCULAR | Status: DC | PRN
  Administered 2019-04-25 – 2019-04-26 (×7): 50 ug via INTRAVENOUS
  Filled 2019-04-25 (×7): qty 2

## 2019-04-25 MED ORDER — POTASSIUM CHLORIDE IN NACL 20-0.9 MEQ/L-% IV SOLN
INTRAVENOUS | Status: DC
  Administered 2019-04-25: 05:00:00 via INTRAVENOUS
  Filled 2019-04-25: qty 1000

## 2019-04-25 MED ORDER — ONDANSETRON HCL 4 MG/2ML IJ SOLN
4.0000 mg | Freq: Once | INTRAMUSCULAR | Status: AC
  Administered 2019-04-25: 4 mg via INTRAVENOUS
  Filled 2019-04-25: qty 2

## 2019-04-25 NOTE — ED Notes (Signed)
Pt vomited 200 mL into emesis bag after drinking contrast dye. Spoke with CT, agreed to continue on with scan and stop oral dye.

## 2019-04-25 NOTE — ED Provider Notes (Signed)
[facility] EMERGENCY DEPARTMENT Provider Note   CSN: [id number] Arrival date & time: 04/24/19  2330     History   Chief Complaint Chief Complaint  Patient presents with   Abdominal Pain    HPI REAVER RAYMER is a 38 y.o. female.     HPI  This is a 38 year old female with a history of chronic intestinal pseudoobstruction, liver cyst, reflux, anxiety who presents with abdominal pain.  Reports 4-day history of ongoing lower abdominal pain.  She states that it is sharp and nonradiating.  She rates her pain at "20 out of 10."  She has taken Bentyl and ibuprofen with minimal relief.  She reports constipation without a good bowel movement in over 2 weeks.  They discussed MiraLAX bowel prep with her GI doctor at [facility].  However, she has not been able to tolerate this and has vomited it up.  She denies any fevers.  She denies any urinary symptoms or back pain.  I have reviewed the patient's chart.  She was seen 2 days ago with largely reassuring work-up.  She did have a plain film at that time.  I have also reviewed her work-up at [facility].  Last imaging study was a CT scan of the abdomen in early July which showed evidence of slow transit.  Past Medical History:  Diagnosis Date   Anxiety    Benign liver cyst    Chronic intestinal pseudo-obstruction    GERD (gastroesophageal reflux disease)    History of UTI     Patient Active Problem List   Diagnosis Date Noted   Syncope due to orthostatic hypotension 04/08/2019   Syncope 04/07/2019   GERD (gastroesophageal reflux disease) 04/07/2019   Acute lower UTI 04/07/2019   Normocytic anemia 04/07/2019   AKI (acute kidney injury) ([place]) 04/07/2019   Chronic intestinal pseudo-obstruction 09/05/2018   Iron deficiency anemia 01/22/2018   Vitamin B deficiency 01/22/2018   Vitamin D deficiency 01/22/2018   PCP NOTES >>>>>>>>>>>>>>>>>>>>>>>>>>>>> 06/28/2017   Underweight 06/27/2017   Cyst of ovary  10/05/2016   Orthostatic hypotension 07/27/2013   Hyperglycemia 07/27/2013   Hepatic adenoma 07/27/2013    Past Surgical History:  Procedure Laterality Date   CESAREAN SECTION     twice 2010, 2013   Esure  ~2015     OB History    Gravida  2   Para  2   Term  1   Preterm      AB      Living  2     SAB      TAB      Ectopic      Multiple      Live Births  1            Home Medications    Prior to Admission medications   Medication Sig Start Date End Date Taking? Authorizing Provider  calcium carbonate 200 MG capsule Take 250 mg by mouth 2 (two) times daily with a meal.    [provider]  dicyclomine (BENTYL) 20 MG tablet Take 1 tablet (20 mg total) by mouth 2 (two) times daily. 04/22/19   Maudie Flakes, MD  iron polysaccharides (FERREX 150) 150 MG capsule Take 1 capsule (150 mg total) by mouth 2 (two) times daily. 03/26/19   Colon Branch, MD  nitrofurantoin, macrocrystal-monohydrate, (MACROBID) 100 MG capsule Take 1 capsule (100 mg total) by mouth 2 (two) times daily. 04/10/19   Colon Branch, MD  omeprazole [facility])  40 MG capsule Take 1 capsule (40 mg total) by mouth 2 (two) times daily before a meal. 03/26/19   Colon Branch, MD  ondansetron (ZOFRAN ODT) 4 MG disintegrating tablet Take 1 tablet (4 mg total) by mouth every 8 (eight) hours as needed for nausea or vomiting. 02/20/19   Colon Branch, MD  Prucalopride Succinate (MOTEGRITY) 2 MG TABS Take 2 mg by mouth daily. 02/20/19   [provider]  Vitamin D, Cholecalciferol, 25 MCG (1000 UT) TABS Take 2,000 Units by mouth daily. 09/27/18   Colon Branch, MD    Family History Family History  Problem Relation Age of Onset   CAD Father    Hypertension Father    Diabetes Father    Diabetes Brother    Colon cancer Neg Hx    Breast cancer Neg Hx     Social History Social History   Tobacco Use   Smoking status: Never Smoker   Smokeless tobacco: Never Used  Substance Use Topics    Alcohol use: No    Comment: socially    Drug use: No     Allergies   Tetracyclines & related, Erythromycin, Raspberry, and Sulfonamide derivatives   Review of Systems Review of Systems  Constitutional: Negative for fever.  Respiratory: Negative for shortness of breath.   Cardiovascular: Negative for chest pain.  Gastrointestinal: Positive for abdominal pain, constipation, nausea and vomiting.  Genitourinary: Negative for dysuria.  Musculoskeletal: Negative for back pain.  Neurological: Negative for dizziness.  All other systems reviewed and are negative.    Physical Exam Updated Vital Signs BP 110/70    Pulse (!) 101    Temp 97.9 F (36.6 C) (Oral)    Resp 16    Ht 1.6 m (5\' 3" )    Wt 43.5 kg    SpO2 100%    BMI 17.01 kg/m   Physical Exam Vitals signs and nursing note reviewed.  Constitutional:      Appearance: She is well-developed.     Comments: Thin, frail, chronically ill-appearing  HENT:     Head: Normocephalic and atraumatic.     Mouth/Throat:     Comments: Mucous membranes dry Neck:     Musculoskeletal: Neck supple.  Cardiovascular:     Rate and Rhythm: Regular rhythm. Tachycardia present.     Heart sounds: Normal heart sounds.  Pulmonary:     Effort: Pulmonary effort is normal. No respiratory distress.     Breath sounds: No wheezing.  Abdominal:     General: Bowel sounds are normal.     Palpations: Abdomen is soft.     Tenderness: There is generalized abdominal tenderness and tenderness in the suprapubic area. There is no guarding or rebound.  Skin:    General: Skin is warm and dry.  Neurological:     Mental Status: She is alert and oriented to person, place, and time.  Psychiatric:        Mood and Affect: Mood is anxious.      ED Treatments / Results  Labs (all labs ordered are listed, but only abnormal results are displayed) Labs Reviewed  URINALYSIS, ROUTINE W REFLEX MICROSCOPIC - Abnormal; Notable for the following components:      Result  Value   APPearance CLOUDY (*)    Hgb urine dipstick SMALL (*)    Ketones, ur 15 (*)    Protein, ur 30 (*)    Leukocytes,Ua MODERATE (*)    All other components within normal limits  URINALYSIS,  MICROSCOPIC (REFLEX) - Abnormal; Notable for the following components:   Bacteria, UA MANY (*)    All other components within normal limits  SARS CORONAVIRUS 2 (TAT 6-24 HRS)    EKG None  Radiology Ct Abdomen Pelvis W Contrast  Result Date: 04/25/2019 CLINICAL DATA:  Severe abdominal pain EXAM: CT ABDOMEN AND PELVIS WITH CONTRAST TECHNIQUE: Multidetector CT imaging of the abdomen and pelvis was performed using the standard protocol following bolus administration of intravenous contrast. CONTRAST:  67mL OMNIPAQUE IOHEXOL 300 MG/ML  SOLN COMPARISON:  01/15/2016 FINDINGS: Lower chest: Lung bases are clear. No effusions. Heart is normal size. Hepatobiliary: Irregular low-density lesion in the posterior right hepatic lobe is stable since prior study. 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 There is biliary ductal dilatation. Common bile duct measures 8 mm. Intrahepatic ductal dilatation also noted. Gallbladder is grossly unremarkable. Pancreas: No focal abnormality or ductal dilatation. Spleen: No focal abnormality.  Normal size. Adrenals/Urinary Tract: No adrenal abnormality. No focal renal abnormality. No stones or hydronephrosis. Urinary bladder is unremarkable. Stomach/Bowel: The small bowel is diffusely distended with fluid. Large bowel is distended with large amount of stool and fluid. No visible obstructing process other than large amounts of stool in the rectosigmoid colon. Stomach is decompressed, grossly unremarkable. Vascular/Lymphatic: No evidence of aneurysm or adenopathy. Reproductive: Uterus and adnexa unremarkable. No mass. Fallopian tube coils  noted, stable. Other: Small amount of free fluid in the pelvis.  No free air. Musculoskeletal: No acute bony abnormality. IMPRESSION: Nonspecific bowel gas pattern. Diffusely distended fluid-filled small bowel loops. Colon is also significantly distended with fluid and large amount of stool, particularly in the rectosigmoid colon. Findings are non-specific. No obstructive process noted other than large amount of stool in the rectosigmoid colon. Intrahepatic and extrahepatic biliary ductal dilatation is similar to prior CT. This was not noted on follow-up MRI imaging. Recommend correlation with LFTs. Small amount of free fluid in the pelvis. Electronically Signed   By: Rolm Baptise M.D.   On: 04/25/2019 01:30    Procedures Procedures (including critical care time)  Medications Ordered in ED Medications  sodium chloride 0.9 % bolus 1,000 mL (1,000 mLs Intravenous New Bag/Given 04/25/19 0022)  fentaNYL (SUBLIMAZE) injection 50 mcg (50 mcg Intravenous Given 04/25/19 0020)  ondansetron (ZOFRAN) injection 4 mg (4 mg Intravenous Given 04/25/19 0020)  iohexol (OMNIPAQUE) 300 MG/ML solution 100 mL (50 mLs Intravenous Contrast Given 04/25/19 0103)  haloperidol lactate (HALDOL) injection 2 mg (2 mg Intravenous Given 04/25/19 0129)     Initial Impression / Assessment and Plan / ED Course  I have reviewed the triage vital signs and the nursing notes.  Pertinent labs & imaging results that were available during my care of the patient were reviewed by me and considered in my medical decision making (see chart for details).        Patient with history of intestinal pseudoobstruction presents with nausea, vomiting, abdominal pain, constipation.  She has not been able to tolerate a bowel prep at home.  Has not had a good bowel movement in over 2 weeks.  She is slightly tachycardic.  Chronically ill-appearing and very thin.  She has some tenderness on exam.  No signs of peritonitis.  I have reviewed her chart.  I  reviewed her lab work.  Mild hyponatremia and hypochloremia likely related to vomiting.  Patient was given pain and nausea medicine.  She had an x-ray 2 days ago that just showed constipation.  Will obtain a CT scan.  CT scan shows significant rectosigmoid stool burden and fluid-filled small bowel.  I have reviewed the images myself.  Suspect that this is consistent with her prior pseudoobstruction.  She continues to have ongoing pain and vomiting in the emergency room.  She would likely benefit from NG tube and a bowel prep as well as fluids.  We discussed admission to the hospital with possible GI consultation for this.  Patient is agreeable to plan.  She has previously seen Dr. Michail Sermon.  Final Clinical Impressions(s) / ED Diagnoses   Final diagnoses:  Intestinal pseudoobstruction  Lower abdominal pain  Slow transit constipation    ED Discharge Orders    None       Merryl Hacker, MD 04/25/19 [phone]

## 2019-04-25 NOTE — Progress Notes (Signed)
Initial Nutrition Assessment  DOCUMENTATION CODES:   Underweight, Severe malnutrition in context of chronic illness  INTERVENTION:  - diet advancement as medically feasible. - will monitor for possible need for nutrition support dependent on medical course.   Monitor magnesium, potassium, and phosphorus daily for at least 3 days once patient started on oral diet or nutrition support, MD to replete as needed, as pt is at risk for refeeding syndrome given severe malnutrition, very limited intakes for >1 week.    NUTRITION DIAGNOSIS:   Severe Malnutrition related to chronic illness as evidenced by severe fat depletion, severe muscle depletion.  GOAL:   Patient will meet greater than or equal to 90% of their needs  MONITOR:   Diet advancement, Labs, Weight trends, I & O's  REASON FOR ASSESSMENT:   Malnutrition Screening Tool  ASSESSMENT:   38 y.o. female with medical history significant for GERD, anxiety, and chronic intestinal pseudo-obstruction. She presented to the ED on 9/8 for evaluation of progressive abdominal pain, distention, and nausea without vomiting. She reported having difficulty moving bowels x2 weeks; prescribed bowel prep outpatient but it caused N/V. Mild hypokalemia and mild hyponatremia in the ED. CT abdomen/pelvis reveals diffusely distended and fluid-filled small bowel loops, significantly distended colon with fluid and large amount of stool, and no other obstructive process identified. She was treated with fentanyl, zofran, haldol, and IV fluids in the ED, continued to have ongoing pain and nausea, and hospitalists were consulted for admission.  Patient has been NPO since admission. NGT to LIS with ~150 ml output at this time. Attempted visit x2 but only able to talk with patient and husband, who is at bedside, briefly, as patient needed to use BSC multiple times.   She confirms that PTA it had been ~2 weeks since she had had a BM. She reports that since  presenting to the ED yesterday she has had multiple BMs with sense of urgency when needing to go. She states that abdominal pain and distention began 1-1.5 weeks ago and that she has had progressive difficulty with taking in foods and drinks d/t this. Unable to determine the last time and thing that patient consumed PTA and unable to determine what she was taking in over the past 1-2 weeks.   Per chart review, patient was following with outpatient RD from 04/02/14-03/02/18. No notes since that time, but unsure if patient was following with any RD outside of [facility] as ED RN note indicates that patient is followed by [facility].  Per chart review, current weight is 96 lb and it appears that weight has been stable x15 months.   Per notes: - abdominal pain and N/V--chronic intestinal pseudo-obstruction; large amount of stool seen in rectosigmoid colon on CT abdomen/pelvis - plan for conservative management with bowel rest, NGT decompression, IV fluids - hyponatremia and hypokalemia--resolved with IV fluids   Labs reviewed; Ca: 8.3 mg/dl. Medications reviewed; SMOG enema x1 today, 4 mg IV zofran x1 today. IVF; D5-NS-40 mEq IV KCl @ 125 ml/hr (510 kcal).     NUTRITION - FOCUSED PHYSICAL EXAM:    Most Recent Value  Orbital Region  Severe depletion  Upper Arm Region  Severe depletion  Thoracic and Lumbar Region  Severe depletion  Buccal Region  Moderate depletion  Temple Region  Moderate depletion  Clavicle Bone Region  Severe depletion  Clavicle and Acromion Bone Region  Severe depletion  Scapular Bone Region  Unable to assess  Dorsal Hand  Moderate depletion  Patellar Region  Severe depletion  Anterior Thigh Region  Moderate depletion  Posterior Calf Region  Moderate depletion  Edema (RD Assessment)  None  Hair  Reviewed  Eyes  Reviewed  Mouth  Reviewed  Skin  Reviewed  Nails  Reviewed       Diet Order:   Diet Order            Diet NPO time specified  Diet effective now               EDUCATION NEEDS:   Not appropriate for education at this time  Skin:  Skin Assessment: Reviewed RN Assessment  Last BM:  9/9  Height:   Ht Readings from Last 1 Encounters:  04/24/19 5\' 3"  (1.6 m)    Weight:   Wt Readings from Last 1 Encounters:  04/24/19 43.5 kg    Ideal Body Weight:  52.3 kg  BMI:  Body mass index is 17.01 kg/m.  Estimated Nutritional Needs:   Kcal:  1655-1830 kcal  Protein:  80-95 grams  Fluid:  >/= 2 L/day     Jarome Matin, MS, RD, LDN, [facility] Inpatient Clinical Dietitian Pager # [phone] After hours/weekend pager # [phone]

## 2019-04-25 NOTE — H&P (Signed)
History and Physical    RAAVI STARLIPER [id number] DOB: [birth year] DOA: 04/24/2019  PCP: Colon Branch, MD   Patient coming from: Home   Chief Complaint: Abdominal pain, abdominal distension, N/V   HPI: Stacy [name] is a 38 y.o. female with medical history significant for GERD, anxiety, and chronic intestinal pseudoobstruction, now presenting to the emergency department for evaluation of progressive abdominal pain, distention, and nausea with vomiting.  She reports having difficulty moving her bowels for the past 2 weeks, was prescribed a bowel prep, but has been unable to tolerate this due to nausea and vomiting.  She denies cough, shortness of breath, fevers, chills, chest pain, or leg swelling.  [facility] ED Course: Upon arrival to the ED, patient is found to be afebrile, slightly tachycardic, and with stable blood pressure.  Chemistry panel is notable for mild hypokalemia and hyponatremia.  CBC is unremarkable.  Urinalysis with 30 protein, 15 ketones, and small hemoglobin.  CT of the abdomen and pelvis reveals diffusely distended and fluid-filled small bowel loops, significantly distended colon with fluid and large amount of stool, and no other obstructive process identified.  Patient was treated with fentanyl, Zofran, Haldol, and IV fluids in the ED, continued to have ongoing pain and nausea, and hospitalists were consulted for admission.  Review of Systems:  All other systems reviewed and apart from HPI, are negative.  Past Medical History:  Diagnosis Date  . Anxiety   . Benign liver cyst   . Chronic intestinal pseudo-obstruction   . GERD (gastroesophageal reflux disease)   . History of UTI     Past Surgical History:  Procedure Laterality Date  . CESAREAN SECTION     twice 2010, 2013  . Esure  ~2015     reports that she has never smoked. She has never used smokeless tobacco. She reports that she does not drink alcohol or use drugs.  Allergies  Allergen  Reactions  . Tetracyclines & Related Swelling    Swelling in spine; required spinal tap.   . Erythromycin Other (See Comments)    unknown  . Raspberry Rash  . Sulfonamide Derivatives Rash    Family History  Problem Relation Age of Onset  . CAD Father   . Hypertension Father   . Diabetes Father   . Diabetes Brother   . Colon cancer Neg Hx   . Breast cancer Neg Hx      Prior to Admission medications   Medication Sig Start Date End Date Taking? Authorizing Provider  calcium carbonate 200 MG capsule Take 250 mg by mouth 2 (two) times daily with a meal.    [provider]  dicyclomine (BENTYL) 20 MG tablet Take 1 tablet (20 mg total) by mouth 2 (two) times daily. 04/22/19   Maudie Flakes, MD  iron polysaccharides (FERREX 150) 150 MG capsule Take 1 capsule (150 mg total) by mouth 2 (two) times daily. 03/26/19   Colon Branch, MD  nitrofurantoin, macrocrystal-monohydrate, (MACROBID) 100 MG capsule Take 1 capsule (100 mg total) by mouth 2 (two) times daily. 04/10/19   Colon Branch, MD  omeprazole (PRILOSEC) 40 MG capsule Take 1 capsule (40 mg total) by mouth 2 (two) times daily before a meal. 03/26/19   Colon Branch, MD  ondansetron (ZOFRAN ODT) 4 MG disintegrating tablet Take 1 tablet (4 mg total) by mouth every 8 (eight) hours as needed for nausea or vomiting. 02/20/19   Colon Branch, MD  Prucalopride Succinate (  MOTEGRITY) 2 MG TABS Take 2 mg by mouth daily. 02/20/19   [provider]  Vitamin D, Cholecalciferol, 25 MCG (1000 UT) TABS Take 2,000 Units by mouth daily. 09/27/18   Colon Branch, MD    Physical Exam: Vitals:   04/24/19 2344 04/24/19 2346 04/25/19 0129 04/25/19 0237  BP: 101/65  110/70 118/84  Pulse: (!) 113  (!) 101 (!) 108  Resp: 20  16 18   Temp: 97.9 F (36.6 C)   98.4 F (36.9 C)  TempSrc: Oral   Oral  SpO2: 100%  100% 100%  Weight:  43.5 kg    Height:  5\' 3"  (1.6 m)      Constitutional: NAD, calm  Eyes: PERTLA, lids and conjunctivae normal ENMT: Mucous  membranes are moist. Posterior pharynx clear of any exudate or lesions.   Neck: normal, supple, no masses, no thyromegaly Respiratory: clear to auscultation bilaterally, no wheezing, no crackles. No accessory muscle use.  Cardiovascular: S1 & S2 heard, regular rate and rhythm. No extremity edema.   Abdomen: mild distension. Generalized tenderness, no rebound pain or guarding. Active but rare bowel sound.  Musculoskeletal: no clubbing / cyanosis. No joint deformity upper and lower extremities.    Skin: no significant rashes, lesions, ulcers. Warm, dry, well-perfused. Neurologic: No facial asymmetry. Sensation intact. Moving all extremities.  Psychiatric: Alert and oriented x 3. Calm, cooperative.    Labs on Admission: I have personally reviewed following labs and imaging studies  CBC: Recent Labs  Lab 04/22/19 1756 04/24/19 2240  WBC 6.3 5.6  HGB 11.0* 12.3  HCT 33.7* 36.1  MCV 90.6 88.3  PLT 311 [id number]   Basic Metabolic Panel: Recent Labs  Lab 04/22/19 1756 04/24/19 2240  NA 136 132*  K 4.2 3.4*  CL 102 96*  CO2 26 22  GLUCOSE 122* 95  BUN 17 12  CREATININE 0.56 0.54  CALCIUM 8.9 9.1   GFR: Estimated Creatinine Clearance: 64.8 mL/min (by C-G formula based on SCr of 0.54 mg/dL). Liver Function Tests: Recent Labs  Lab 04/22/19 1756 04/24/19 2240  AST 9* 12*  ALT 7 9  ALKPHOS 45 43  BILITOT 0.6 1.0  PROT 6.3* 7.0  ALBUMIN 3.7 4.1   Recent Labs  Lab 04/22/19 1756 04/24/19 2240  LIPASE 31 33   No results for input(s): AMMONIA in the last 168 hours. Coagulation Profile: No results for input(s): INR, PROTIME in the last 168 hours. Cardiac Enzymes: No results for input(s): CKTOTAL, CKMB, CKMBINDEX, TROPONINI in the last 168 hours. BNP (last 3 results) No results for input(s): PROBNP in the last 8760 hours. HbA1C: No results for input(s): HGBA1C in the last 72 hours. CBG: No results for input(s): GLUCAP in the last 168 hours. Lipid Profile: No results for  input(s): CHOL, HDL, LDLCALC, TRIG, CHOLHDL, LDLDIRECT in the last 72 hours. Thyroid Function Tests: No results for input(s): TSH, T4TOTAL, FREET4, T3FREE, THYROIDAB in the last 72 hours. Anemia Panel: No results for input(s): VITAMINB12, FOLATE, FERRITIN, TIBC, IRON, RETICCTPCT in the last 72 hours. Urine analysis:    Component Value Date/Time   COLORURINE YELLOW 04/24/2019 2349   APPEARANCEUR CLOUDY (A) 04/24/2019 2349   LABSPEC 1.020 04/24/2019 2349   PHURINE 7.5 04/24/2019 2349   GLUCOSEU NEGATIVE 04/24/2019 2349   HGBUR SMALL (A) 04/24/2019 2349   BILIRUBINUR NEGATIVE 04/24/2019 2349   KETONESUR 15 (A) 04/24/2019 2349   PROTEINUR 30 (A) 04/24/2019 2349   UROBILINOGEN 0.2 09/13/2013 0322   NITRITE NEGATIVE 04/24/2019 2349  LEUKOCYTESUR MODERATE (A) 04/24/2019 2349   Sepsis Labs: @LABRCNTIP (procalcitonin:4,lacticidven:4) )No results found for this or any previous visit (from the past 240 hour(s)).   Radiological Exams on Admission: Ct Abdomen Pelvis W Contrast  Result Date: 04/25/2019 CLINICAL DATA:  Severe abdominal pain EXAM: CT ABDOMEN AND PELVIS WITH CONTRAST TECHNIQUE: Multidetector CT imaging of the abdomen and pelvis was performed using the standard protocol following bolus administration of intravenous contrast. CONTRAST:  66mL OMNIPAQUE IOHEXOL 300 MG/ML  SOLN COMPARISON:  01/15/2016 FINDINGS: Lower chest: Lung bases are clear. No effusions. Heart is normal size. Hepatobiliary: Irregular low-density lesion in the posterior right hepatic lobe is stable since prior study. 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 There is biliary ductal dilatation. Common bile duct measures 8 mm. Intrahepatic ductal dilatation also noted. Gallbladder is grossly unremarkable. Pancreas: No focal abnormality or ductal dilatation. Spleen: No focal  abnormality.  Normal size. Adrenals/Urinary Tract: No adrenal abnormality. No focal renal abnormality. No stones or hydronephrosis. Urinary bladder is unremarkable. Stomach/Bowel: The small bowel is diffusely distended with fluid. Large bowel is distended with large amount of stool and fluid. No visible obstructing process other than large amounts of stool in the rectosigmoid colon. Stomach is decompressed, grossly unremarkable. Vascular/Lymphatic: No evidence of aneurysm or adenopathy. Reproductive: Uterus and adnexa unremarkable. No mass. Fallopian tube coils noted, stable. Other: Small amount of free fluid in the pelvis.  No free air. Musculoskeletal: No acute bony abnormality. IMPRESSION: Nonspecific bowel gas pattern. Diffusely distended fluid-filled small bowel loops. Colon is also significantly distended with fluid and large amount of stool, particularly in the rectosigmoid colon. Findings are non-specific. No obstructive process noted other than large amount of stool in the rectosigmoid colon. Intrahepatic and extrahepatic biliary ductal dilatation is similar to prior CT. This was not noted on follow-up MRI imaging. Recommend correlation with LFTs. Small amount of free fluid in the pelvis. Electronically Signed   By: Rolm Baptise M.D.   On: 04/25/2019 01:30    EKG: Not performed.   Assessment/Plan   1. Abdominal pain with nausea & vomiting; chronic intestinal pseudo-obstruction  - Patient reports hx of chronic intestinal pseudo-obstruction and presents with progressive abdominal pain, distension, and N/V  - She was prescribed a bowel prep for this but was unable to complete at home d/t N/V  - CT abd/pelvis features diffusely distended small bowel and colon with fluid-filled loops of small bowel and large amount of stool in rectosigmoid colon without any other obstructive process  - She was treated in ED with antiemetics, analgesia, and IVF  - Plan to continue conservative management with bowel  rest, NGT decompression, IVF, monitor electrolytes, serial exams    2. Hyponatremia  - Serum sodium is 132 in setting of hypovolemia  - She was given 1 liter NS in ED and is continued on NS infusion  - Repeat chem panel in am   3. Hypokalemia  - Serum potassium slightly low in setting of N/V  - KCl added to IVF, repeat chem panel in am    PPE: Mask, face shield  DVT prophylaxis: Lovenox  Code Status: Full  Family Communication: Discussed with patient  Consults called: None  Admission status: Observation     Vianne Bulls, MD [facility] Pager [phone]  If 7PM-7AM, please contact night-coverage [email or web address] Password [email or web address]  04/25/2019, 3:42 AM

## 2019-04-25 NOTE — ED Notes (Signed)
Patient transported to CT 

## 2019-04-25 NOTE — Progress Notes (Signed)
I have seen and assessed patient and agree with Dr. Criss Rosales assessment and plan.  Patient is a 38 year old female medical history of gastroesophageal reflux disease, anxiety, chronic intestinal pseudoobstruction presented to the ED for progressive abdominal pain, distention, nausea and vomiting.  Patient with difficulty having bowel movements over the past 2 weeks.  Patient prescribed bowel prep but unable to tolerate due to nausea and vomiting.  Patient seen in the ED CT abdomen and pelvis done showed diffusely distended and fluid-filled small bowel loops, significantly distended colon with fluid and large amount of stool, no other obstructive process identified.  Patient with NG tube in place.  Patient on IV Protonix.  Will order a SMOG enema.  Clamp NG tube and allow sips of clears from floor stock.  If patient develops nausea and emesis and clamp NG tube in place back to low intermittent wall suction.  Will likely need bowel regimen on discharge.  Supportive care.    No charge.

## 2019-04-26 ENCOUNTER — Ambulatory Visit: Payer: 59

## 2019-04-26 LAB — COMPREHENSIVE METABOLIC PANEL
ALT: 7 U/L (ref 0–44)
AST: 8 U/L — ABNORMAL LOW (ref 15–41)
Albumin: 3.2 g/dL — ABNORMAL LOW (ref 3.5–5.0)
Alkaline Phosphatase: 45 U/L (ref 38–126)
Anion gap: 7 (ref 5–15)
BUN: 5 mg/dL — ABNORMAL LOW (ref 6–20)
CO2: 23 mmol/L (ref 22–32)
Calcium: 8.1 mg/dL — ABNORMAL LOW (ref 8.9–10.3)
Chloride: 107 mmol/L (ref 98–111)
Creatinine, Ser: 0.47 mg/dL (ref 0.44–1.00)
GFR calc Af Amer: >60 mL/min (ref >60)
GFR calc non Af Amer: >60 mL/min (ref >60)
Glucose, Bld: 102 mg/dL — ABNORMAL HIGH (ref 70–99)
Potassium: 4.1 mmol/L (ref 3.5–5.1)
Sodium: 137 mmol/L (ref 135–145)
Total Bilirubin: 0.7 mg/dL (ref 0.3–1.2)
Total Protein: 5.7 g/dL — ABNORMAL LOW (ref 6.5–8.1)

## 2019-04-26 LAB — CBC
HCT: 31 % — ABNORMAL LOW (ref 36.0–46.0)
Hemoglobin: 9.9 g/dL — ABNORMAL LOW (ref 12.0–15.0)
MCH: 29.6 pg (ref 26.0–34.0)
MCHC: 31.9 g/dL (ref 30.0–36.0)
MCV: 92.5 fL (ref 80.0–100.0)
Platelets: 238 10*3/uL (ref 150–400)
RBC: 3.35 MIL/uL — ABNORMAL LOW (ref 3.87–5.11)
RDW: 12.3 % (ref 11.5–15.5)
WBC: 4.5 10*3/uL (ref 4.0–10.5)
nRBC: 0 % (ref 0.0–0.2)

## 2019-04-26 LAB — GLUCOSE, CAPILLARY: Glucose-Capillary: 96 mg/dL (ref 70–99)

## 2019-04-26 NOTE — Progress Notes (Signed)
Patient ID: Stacy [name], female   DOB: 1981/07/22, 38 y.o.   MRN: [id number]                                                                PROGRESS NOTE                                                                                                                                                                                                             Patient Demographics:    Stacy [name], is a 38 y.o. female, DOB - 03/12/1981, [id number]  Admit date - 04/24/2019   Admitting Physician Vianne Bulls, MD  Outpatient Primary MD for the patient is Colon Branch, MD  LOS - 1  Outpatient Specialists:     Chief Complaint  Patient presents with  . Abdominal Pain       Brief Narrative 38 y.o. female with medical history significant for GERD, anxiety, and chronic intestinal pseudoobstruction, now presenting to the emergency department for evaluation of progressive abdominal pain, distention, and nausea with vomiting.  She reports having difficulty moving her bowels for the past 2 weeks, was prescribed a bowel prep, but has been unable to tolerate this due to nausea and vomiting.  She denies cough, shortness of breath, fevers, chills, chest pain, or leg swelling.  [facility] ED Course: Upon arrival to the ED, patient is found to be afebrile, slightly tachycardic, and with stable blood pressure.  Chemistry panel is notable for mild hypokalemia and hyponatremia.  CBC is unremarkable.  Urinalysis with 30 protein, 15 ketones, and small hemoglobin.  CT of the abdomen and pelvis reveals diffusely distended and fluid-filled small bowel loops, significantly distended colon with fluid and large amount of stool, and no other obstructive process identified.  Patient was treated with fentanyl, Zofran, Haldol, and IV fluids in the ED, continued to have ongoing pain and nausea, and hospitalists were consulted for admission.   Subjective:    Stacy [name] today has been afebrile, feeling  well.  Able to tolerate liquids.  Pt would like to advance diet.  Pt state she had a normal bm this am.  Pt denies n/v, abd pain, diarrhea, brbpr.    No headache, No chest pain, , No new weakness tingling or numbness, No Cough -  SOB.    Assessment  & Plan :    Principal Problem:   Acute intestinal pseudo-obstruction Active Problems:   Abdominal pain with vomiting   Hyponatremia   Hypokalemia   Protein-calorie malnutrition, severe   1. Abdominal pain with nausea & vomiting; chronic intestinal pseudo-obstruction  Cont - Patient reports hx of chronic intestinal pseudo-obstruction and presents with progressive abdominal pain, distension, and N/V  - She was prescribed a bowel prep for this but was unable to complete at home d/t N/V  - CT abd/pelvis features diffusely distended small bowel and colon with fluid-filled loops of small bowel and large amount of stool in rectosigmoid colon without any other obstructive process  - She was treated in ED with antiemetics, analgesia, and IVF  Cont Fentanyl  Cont Zofran, Phenergan Cont Protonix  2. Hyponatremia  - Serum sodium is 132 in setting of hypovolemia  - She was given 1 liter NS in ED and is continued on NS infusion  improved  3. Hypokalemia  - Serum potassium slightly low in setting of N/V  - KCl added to IVF, repeat chem panel in am  improved  Anemia Stable   Code Status :  FULL CODE  Family Communication  :  No family at bedside  Disposition Plan  :    Home tomorrow  Barriers For Discharge :   Consults  :   none  Procedures  :  CT scan abd/ pelvis 9/9  DVT Prophylaxis  :  Lovenox - SCDs   Lab Results  Component Value Date   PLT 249 04/25/2019    Antibiotics  :   none  Anti-infectives (From admission, onward)   None        Objective:   Vitals:   04/25/19 0237 04/25/19 0643 04/25/19 1402 04/25/19 2045  BP: 118/84 113/83 108/72 115/80  Pulse: (!) 108 (!) 103 95 (!) 106  Resp: 18 20 16 16   Temp: 98.4  F (36.9 C) 98.3 F (36.8 C) 97.9 F (36.6 C) 98.5 F (36.9 C)  TempSrc: Oral Oral Oral Oral  SpO2: 100% 97% 96% 98%  Weight:      Height:        Wt Readings from Last 3 Encounters:  04/24/19 43.5 kg  04/22/19 43.5 kg  04/09/19 43.7 kg     Intake/Output Summary (Last 24 hours) at 04/26/2019 0518 Last data filed at 04/25/2019 1413 Gross per 24 hour  Intake 396.49 ml  Output -  Net 396.49 ml     Physical Exam  Awake Alert, Oriented X 3, No new F.N deficits, Normal affect [place].AT,PERRAL Supple Neck,No JVD, No cervical lymphadenopathy appriciated.  Symmetrical Chest wall movement, Good air movement bilaterally, CTAB RRR,No Gallops,Rubs or new Murmurs, No Parasternal Heave +ve B.Sounds, Abd Soft, No tenderness, No organomegaly appriciated, No rebound - guarding or rigidity. No Cyanosis, Clubbing or edema, No new Rash or bruise   NGT in place   Data Review:    CBC Recent Labs  Lab 04/22/19 1756 04/24/19 2240 04/25/19 0540  WBC 6.3 5.6 6.3  HGB 11.0* 12.3 10.5*  HCT 33.7* 36.1 31.7*  PLT 311 332 249  MCV 90.6 88.3 91.1  MCH 29.6 30.1 30.2  MCHC 32.6 34.1 33.1  RDW 12.1 11.9 12.1    Chemistries  Recent Labs  Lab 04/22/19 1756 04/24/19 2240 04/25/19 0540  NA 136 132* 136  K 4.2 3.4* 3.6  CL 102 96* 105  CO2 26 22 25   GLUCOSE 122* 95 117*  BUN 17 12 19   CREATININE 0.56 0.54 0.73  CALCIUM 8.9 9.1 8.3*  MG  --   --  2.0  AST 9* 12* 8*  ALT 7 9 7   ALKPHOS 45 43 44  BILITOT 0.6 1.0 1.3*   ------------------------------------------------------------------------------------------------------------------ No results for input(s): CHOL, HDL, LDLCALC, TRIG, CHOLHDL, LDLDIRECT in the last 72 hours.  Lab Results  Component Value Date   HGBA1C 5.1 07/28/2013   ------------------------------------------------------------------------------------------------------------------ No results for input(s): TSH, T4TOTAL, T3FREE, THYROIDAB in the last 72 hours.   Invalid input(s): FREET3 ------------------------------------------------------------------------------------------------------------------ No results for input(s): VITAMINB12, FOLATE, FERRITIN, TIBC, IRON, RETICCTPCT in the last 72 hours.  Coagulation profile No results for input(s): INR, PROTIME in the last 168 hours.  No results for input(s): DDIMER in the last 72 hours.  Cardiac Enzymes No results for input(s): CKMB, TROPONINI, MYOGLOBIN in the last 168 hours.  Invalid input(s): CK ------------------------------------------------------------------------------------------------------------------ No results found for: BNP  Inpatient Medications  Scheduled Meds: . enoxaparin (LOVENOX) injection  20 mg Subcutaneous Q24H  . pantoprazole (PROTONIX) IV  40 mg Intravenous Q24H   Continuous Infusions: . dextrose 5 % and 0.9 % NaCl with KCl 40 mEq/L 125 mL/hr at 04/25/19 1413   PRN Meds:.acetaminophen **OR** acetaminophen, fentaNYL (SUBLIMAZE) injection, ondansetron **OR** ondansetron (ZOFRAN) IV, promethazine  Micro Results Recent Results (from the past 240 hour(s))  SARS CORONAVIRUS 2 (TAT 6-24 HRS) Nasopharyngeal Nasopharyngeal Swab     Status: None   Collection Time: 04/25/19  1:45 AM   Specimen: Nasopharyngeal Swab  Result Value Ref Range Status   SARS Coronavirus 2 NEGATIVE NEGATIVE Final    Comment: (NOTE) SARS-CoV-2 target nucleic acids are NOT DETECTED. The SARS-CoV-2 RNA is generally detectable in upper and lower respiratory specimens during the acute phase of infection. Negative results do not preclude SARS-CoV-2 infection, do not rule out co-infections with other pathogens, and should not be used as the sole basis for treatment or other patient management decisions. Negative results must be combined with clinical observations, patient history, and epidemiological information. The expected result is Negative. Fact Sheet for Patients:  SugarRoll.be Fact Sheet for Healthcare Providers: https://www.woods-mathews.com/ This test is not yet approved or cleared by the [place] FDA and  has been authorized for detection and/or diagnosis of SARS-CoV-2 by FDA under an Emergency Use Authorization (EUA). This EUA will remain  in effect (meaning this test can be used) for the duration of the COVID-19 declaration under Section 56 4(b)(1) of the Act, 21 U.S.C. section 360bbb-3(b)(1), unless the authorization is terminated or revoked sooner. Performed at [facility], [place] [address], [place], [place] [address]     Radiology Reports Ct Abdomen Pelvis W Contrast  Result Date: 04/25/2019 CLINICAL DATA:  Severe abdominal pain EXAM: CT ABDOMEN AND PELVIS WITH CONTRAST TECHNIQUE: Multidetector CT imaging of the abdomen and pelvis was performed using the standard protocol following bolus administration of intravenous contrast. CONTRAST:  20mL OMNIPAQUE IOHEXOL 300 MG/ML  SOLN COMPARISON:  01/15/2016 FINDINGS: Lower chest: Lung bases are clear. No effusions. Heart is normal size. Hepatobiliary: Irregular low-density lesion in the posterior right hepatic lobe is stable since prior study. 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 There is biliary ductal dilatation. Common bile duct measures 8 mm. Intrahepatic ductal dilatation also noted. Gallbladder is grossly unremarkable. Pancreas: No focal abnormality or ductal dilatation. Spleen: No  focal abnormality.  Normal size. Adrenals/Urinary Tract: No adrenal abnormality. No focal renal abnormality. No stones or hydronephrosis. Urinary bladder is unremarkable. Stomach/Bowel: The small bowel is diffusely distended with fluid. Large bowel is distended with large amount of stool and fluid. No visible obstructing  process other than large amounts of stool in the rectosigmoid colon. Stomach is decompressed, grossly unremarkable. Vascular/Lymphatic: No evidence of aneurysm or adenopathy. Reproductive: Uterus and adnexa unremarkable. No mass. Fallopian tube coils noted, stable. Other: Small amount of free fluid in the pelvis.  No free air. Musculoskeletal: No acute bony abnormality. IMPRESSION: Nonspecific bowel gas pattern. Diffusely distended fluid-filled small bowel loops. Colon is also significantly distended with fluid and large amount of stool, particularly in the rectosigmoid colon. Findings are non-specific. No obstructive process noted other than large amount of stool in the rectosigmoid colon. Intrahepatic and extrahepatic biliary ductal dilatation is similar to prior CT. This was not noted on follow-up MRI imaging. Recommend correlation with LFTs. Small amount of free fluid in the pelvis. Electronically Signed   By: Rolm Baptise M.D.   On: 04/25/2019 01:30   Dg Abd Acute 2+v W 1v Chest  Result Date: 04/22/2019 CLINICAL DATA:  Decreased bowel movements and abdominal cramping over past couple days, history of chronic intestinal pseudo-obstruction, GERD EXAM: DG ABDOMEN ACUTE W/ 1V CHEST COMPARISON:  02/06/2014 FINDINGS: Normal heart size, mediastinal contours, and pulmonary vascularity. Lungs clear. No pleural effusion or pneumothorax. Increased stool throughout colon. No bowel dilatation, bowel wall thickening or free air. Essure tubal occlusion devices noted in pelvis. No urinary tract calcification. Question mild osseous demineralization. IMPRESSION: Increased stool throughout colon. No evidence of bowel obstruction or perforation. Electronically Signed   By: Lavonia Dana M.D.   On: 04/22/2019 18:57    Time Spent in minutes  30   Jani Gravel M.D on 04/26/2019 at 5:18 AM  Between 7am to 7pm - Pager - [phone]  After 7pm go to [email or web address] - password [email or web address]  [facility] -  Office  [phone]

## 2019-04-26 NOTE — Discharge Summary (Signed)
Patient ID: Stacy [name] MRN: [id number] DOB/AGE: [birth year] 38 y.o.  Admit date: 04/24/2019 Discharge date: 04/26/2019  Primary Care Physician:  Colon Branch, MD  Discharge Diagnoses:   Present on Admission: . Abdominal pain with vomiting . Acute intestinal pseudo-obstruction . Hyponatremia . Hypokalemia . 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 and presents with progressive abdominal pain, distension, and N/V -She was prescribed a bowel prep for this but was unable to complete at home d/t N/V -CT abd/pelvis features diffusely distended small bowel and colon with fluid-filled loops of small bowel and large amount of stool in rectosigmoid colon without any other obstructive process -She was treated in ED with antiemetics, analgesia, and IVF Pt is back to her baseline and tolerating regular diet, will be discharged home  Please follow up with your pcp  2.Hyponatremia Serum sodium is 132 in setting of hypovolemia She was given 1 liter NS in ED and is continued on NS infusion Improved pcp to please check cmp in 2 weeks  3.Hypokalemia -Serum potassium slightly low in setting of N/V -KCl added to IVF, repeat chem panel in am  Improved pcp to please check cmp in 2 weeks  Anemia Stable pcp to please check cbc in 2 weeks   Discharge Medications: Allergies as of 04/26/2019      Reactions   Tetracyclines & Related Swelling   Swelling in spine; required spinal tap.   Erythromycin Other (See Comments)   unknown   Raspberry Rash   Sulfonamide Derivatives Rash      Medication List    TAKE these medications   calcium carbonate 200 MG capsule Take 250 mg by mouth 2 (two) times daily with a meal.   dicyclomine 20 MG tablet Commonly known as: BENTYL Take 1 tablet (20 mg total) by mouth 2  (two) times daily.   iron polysaccharides 150 MG capsule Commonly known as: Ferrex 150 Take 1 capsule (150 mg total) by mouth 2 (two) times daily.   Motegrity 2 MG Tabs Generic drug: Prucalopride Succinate Take 2 mg by mouth daily.   omeprazole 40 MG capsule Commonly known as: PRILOSEC Take 1 capsule (40 mg total) by mouth 2 (two) times daily before a meal.   ondansetron 4 MG disintegrating tablet Commonly known as: Zofran ODT Take 1 tablet (4 mg total) by mouth every 8 (eight) hours as needed for nausea or vomiting.   polyethylene glycol 17 g packet Commonly known as: MIRALAX / GLYCOLAX Take 17 g by mouth daily as needed for mild constipation.   Vitamin D (Cholecalciferol) 25 MCG (1000 UT) Tabs Take 2,000 Units by mouth daily.        Brief H and P: 38 y.o.femalewith medical history significant forGERD, anxiety, and chronic intestinal pseudoobstruction, now presenting to the emergency department for evaluation of progressive abdominal pain, distention, and nausea with vomiting. She reports having difficulty moving her bowels for the past 2 weeks, was prescribed a bowel prep, but has been unable to tolerate this due to nausea and vomiting. She denies cough, shortness of breath, fevers, chills, chest pain, or leg swelling.  [facility]Upon arrival to the ED, patient is found to be afebrile, slightly tachycardic, and with stable blood pressure. Chemistry panel is notable for mild hypokalemia and hyponatremia. CBC is unremarkable. Urinalysis with 30 protein, 15 ketones, and small hemoglobin. CT of the abdomen and  pelvis reveals diffusely distended and fluid-filled small bowel loops, significantly distended colon with fluid and large amount of stool, and no other obstructive process identified. Patient was treated with fentanyl, Zofran, Haldol, and IV fluids in the ED, continued to have ongoing pain and nausea, and hospitalists were consulted for  admission.  Hospital Course:  Pt was admitted for abdominal pain and n/v.  Pt states that she has improved dramatically from NGT placement and this was removed this morning at patient request.  Pt received fentanyl and zofran/ phenergan in the ED, as welll as protonix 40mg  iv qday.   Pt has been tolerating clear liquids over nite and full diet this morning.   Pt's hyponatremia and hypokalemia resolved with repletion and are wnl this morning.  Anemia is stable.  Pt desires to be discharged home today.     Principal Problem:   Acute intestinal pseudo-obstruction Active Problems:   Abdominal pain with vomiting   Hyponatremia   Hypokalemia   Day of Discharge BP 107/75 (BP Location: Left Arm)   Pulse 99   Temp 98.4 F (36.9 C) (Oral)   Resp 18   Ht 5\' 3"  (1.6 m)   Wt 43.5 kg   SpO2 97%   BMI 17.01 kg/m   Physical Exam: General: Alert and awake oriented x3 not in any acute distress. HEENT: anicteric sclera, pupils reactive to light and accommodation CVS: S1-S2 clear no murmur rubs or gallops Chest: clear to auscultation bilaterally, no wheezing rales or rhonchi Abdomen: soft nontender, nondistended, normal bowel sounds, no organomegaly Extremities: no cyanosis, clubbing or edema noted bilaterally Neuro: Cranial nerves II-XII intact, no focal neurological deficits   The results of significant diagnostics from this hospitalization (including imaging, microbiology, ancillary and laboratory) are listed below for reference.    LAB RESULTS: Basic Metabolic Panel: Recent Labs  Lab 04/25/19 0540 04/26/19 0514  NA 136 137  K 3.6 4.1  CL 105 107  CO2 25 23  GLUCOSE 117* 102*  BUN 19 5*  CREATININE 0.73 0.47  CALCIUM 8.3* 8.1*  MG 2.0  --    Liver Function Tests: Recent Labs  Lab 04/25/19 0540 04/26/19 0514  AST 8* 8*  ALT 7 7  ALKPHOS 44 45  BILITOT 1.3* 0.7  PROT 6.3* 5.7*  ALBUMIN 3.6 3.2*   Recent Labs  Lab 04/22/19 1756 04/24/19 2240  LIPASE 31 33   No  results for input(s): AMMONIA in the last 168 hours. CBC: Recent Labs  Lab 04/25/19 0540 04/26/19 0514  WBC 6.3 4.5  HGB 10.5* 9.9*  HCT 31.7* 31.0*  MCV 91.1 92.5  PLT 249 238   Cardiac Enzymes: No results for input(s): CKTOTAL, CKMB, CKMBINDEX, TROPONINI in the last 168 hours. BNP: Invalid input(s): POCBNP CBG: Recent Labs  Lab 04/25/19 0731 04/26/19 0735  GLUCAP 96 96    Significant Diagnostic Studies:  Ct Abdomen Pelvis W Contrast  Result Date: 04/25/2019 CLINICAL DATA:  Severe abdominal pain EXAM: CT ABDOMEN AND PELVIS WITH CONTRAST TECHNIQUE: Multidetector CT imaging of the abdomen and pelvis was performed using the standard protocol following bolus administration of intravenous contrast. CONTRAST:  62mL OMNIPAQUE IOHEXOL 300 MG/ML  SOLN COMPARISON:  01/15/2016 FINDINGS: Lower chest: Lung bases are clear. No effusions. Heart is normal size. Hepatobiliary: Irregular low-density lesion in the posterior right hepatic lobe is stable since prior study. 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 There is a caudate lesion again noted measuring up to 2.3 cm, stable shown on  prior MRI to be most compatible with hemangioma. There is biliary ductal dilatation. Common bile duct measures 8 mm. Intrahepatic ductal dilatation also noted. Gallbladder is grossly unremarkable. Pancreas: No focal abnormality or ductal dilatation. Spleen: No focal abnormality.  Normal size. Adrenals/Urinary Tract: No adrenal abnormality. No focal renal abnormality. No stones or hydronephrosis. Urinary bladder is unremarkable. Stomach/Bowel: The small bowel is diffusely distended with fluid. Large bowel is distended with large amount of stool and fluid. No visible obstructing process other than large amounts of stool in the rectosigmoid colon. Stomach is decompressed, grossly unremarkable. Vascular/Lymphatic: No evidence of aneurysm or adenopathy. Reproductive: Uterus and adnexa unremarkable. No  mass. Fallopian tube coils noted, stable. Other: Small amount of free fluid in the pelvis.  No free air. Musculoskeletal: No acute bony abnormality. IMPRESSION: Nonspecific bowel gas pattern. Diffusely distended fluid-filled small bowel loops. Colon is also significantly distended with fluid and large amount of stool, particularly in the rectosigmoid colon. Findings are non-specific. No obstructive process noted other than large amount of stool in the rectosigmoid colon. Intrahepatic and extrahepatic biliary ductal dilatation is similar to prior CT. This was not noted on follow-up MRI imaging. Recommend correlation with LFTs. Small amount of free fluid in the pelvis. Electronically Signed   By: Rolm Baptise M.D.   On: 04/25/2019 01:30     Disposition and Follow-up: please follow up with PCP in 2 weeks.    DISPOSITION:   home  DIET: regular  ACTIVITY: as tolerated  TESTS THAT NEED FOLLOW-UP Pt needs cbc, cmp in 2 weeks to ensure that sodium , potassium and hgb are wnl.   [place], MD Follow up in 2 week(s).   Specialty: Internal Medicine Contact information: [address] [place] [address] [phone]           Time spent on Discharge: 45 minutes  Signed:   Jani Gravel M.D. [facility] 04/26/2019, 2:24 PM Pager: [phone]    If 7PM-7AM, please contact night-coverage [email or web address] Password [email or web address]

## 2019-04-27 ENCOUNTER — Telehealth: Payer: Self-pay | Admitting: *Deleted

## 2019-04-27 NOTE — Telephone Encounter (Signed)
Transition Care Management Follow-up Telephone Call   Date discharged?  04/26/19   How have you been since you were released from the hospital? "Doing much better"   Do you understand why you were in the hospital? yes   Do you understand the discharge instructions? yes   Where were you discharged to? Home.   Items Reviewed:  Medications reviewed: pt states no changes  Allergies reviewed: pt states no changes  Dietary changes reviewed: no  Referrals reviewed: no   Functional Questionnaire:   Activities of Daily Living (ADLs):   She states they are independent in the following: ambulation, bathing and hygiene, feeding, continence, grooming, toileting and dressing States they require assistance with the following: na   Any transportation issues/concerns?: no   Any patient concerns? no   Confirmed importance and date/time of follow-up visits scheduled yes  Provider Appointment booked with PCP 05/04/19  Confirmed with patient if condition begins to worsen call PCP or go to the ER.  Patient was given the office number and encouraged to call back with question or concerns.  : yes

## 2019-05-04 ENCOUNTER — Ambulatory Visit: Payer: 59

## 2019-05-04 ENCOUNTER — Ambulatory Visit: Payer: 59 | Admitting: Internal Medicine

## 2019-05-04 ENCOUNTER — Encounter: Payer: Self-pay | Admitting: Internal Medicine

## 2019-05-04 ENCOUNTER — Other Ambulatory Visit: Payer: Self-pay

## 2019-05-04 VITALS — BP 86/70 | HR 98 | Temp 97.3°F | Resp 16 | Ht 63.0 in | Wt 90.5 lb

## 2019-05-04 DIAGNOSIS — E539 Vitamin B deficiency, unspecified: Secondary | ICD-10-CM | POA: Diagnosis not present

## 2019-05-04 DIAGNOSIS — K5900 Constipation, unspecified: Secondary | ICD-10-CM

## 2019-05-04 DIAGNOSIS — K598 Other specified functional intestinal disorders: Secondary | ICD-10-CM | POA: Diagnosis not present

## 2019-05-04 DIAGNOSIS — Z23 Encounter for immunization: Secondary | ICD-10-CM

## 2019-05-04 DIAGNOSIS — K5989 Other specified functional intestinal disorders: Secondary | ICD-10-CM

## 2019-05-04 MED ORDER — CYANOCOBALAMIN 1000 MCG/ML IJ SOLN
1000.0000 ug | Freq: Once | INTRAMUSCULAR | Status: AC
  Administered 2019-05-04: 16:00:00 1000 ug via INTRAMUSCULAR

## 2019-05-04 NOTE — Progress Notes (Signed)
Pre visit review using our clinic review tool, if applicable. No additional management support is needed unless otherwise documented below in the visit note. 

## 2019-05-04 NOTE — Progress Notes (Signed)
Subjective:    Patient ID: Stacy [name], female    DOB: [birth year], 38 y.o.   MRN: [id number]  DOS:  05/04/2019 Type of visit - description: TCM 14 Patient was admitted to the hospital and discharge 04/26/2019.  She presented with abdominal pain, distention, nausea and vomiting. Had constipation. She was intolerant to treatment with bowel prep prior to be admitted.  CT abdomen and pelvis:  Nonspecific bowel gas pattern. Diffusely distended fluid-filled small bowel loops. Colon is also significantly distended with fluid and large amount of stool, particularly in the rectosigmoid colon. Findings are non-specific. No obstructive process noted other than large amount of stool in the rectosigmoid colon.  Intrahepatic and extrahepatic biliary ductal dilatation is similar to prior CT. This was not noted on follow-up MRI imaging. Recommend correlation with LFTs.  Small amount of free fluid in the pelvis.  Reportedly got better after NG tube and enema.  Hyponatremia, needs a CMP Hypokalemia needs follow-up Anemia felt to be stable  BP Readings from Last 3 Encounters:  05/04/19 (!) 86/70  04/26/19 107/75  04/24/19 100/69    Wt Readings from Last 3 Encounters:  05/04/19 90 lb 8 oz (41.1 kg)  04/24/19 96 lb (43.5 kg)  04/22/19 96 lb (43.5 kg)    Review of Systems Since she left the hospital she is doing better. No fever chills No nausea, vomiting, blood in the stools. She is tolerating fluids & solid foods well, eating 3 times a day and having snacks. She felt MiraLAX once a day was not enough and  increased to twice a day and is doing better.  Past Medical History:  Diagnosis Date  . Anxiety   . Benign liver cyst   . Chronic intestinal pseudo-obstruction   . GERD (gastroesophageal reflux disease)   . History of UTI     Past Surgical History:  Procedure Laterality Date  . CESAREAN SECTION     twice 2010, 2013  . Esure  ~2015    Social History   Socioeconomic  History  . Marital status: Married    Spouse name: Not on file  . Number of children: 2  . Years of education: Not on file  . Highest education level: Not on file  Occupational History  . Occupation: stay home ; associate degree paralega   Social Needs  . Financial resource strain: Not hard at all  . Food insecurity    Worry: Patient refused    Inability: Patient refused  . Transportation needs    Medical: Patient refused    Non-medical: Patient refused  Tobacco Use  . Smoking status: Never Smoker  . Smokeless tobacco: Never Used  Substance and Sexual Activity  . Alcohol use: No    Comment: socially   . Drug use: No  . Sexual activity: Yes    Birth control/protection: None  Lifestyle  . Physical activity    Days per week: Patient refused    Minutes per session: Patient refused  . Stress: Only a little  Relationships  . Social Herbalist on phone: Not on file    Gets together: Not on file    Attends religious service: Not on file    Active member of club or organization: Not on file    Attends meetings of clubs or organizations: Not on file    Relationship status: Not on file  . Intimate partner violence    Fear of current or ex partner: Patient refused  Emotionally abused: Patient refused    Physically abused: Patient refused    Forced sexual activity: Patient refused  Other Topics Concern  . Not on file  Social History Narrative   Household: pt, husband , 2 children   2 boys: 2010, 2013   P2G2   Original  from [place], no foreign trips      Allergies as of 05/04/2019      Reactions   Tetracyclines & Related Swelling   Swelling in spine; required spinal tap.   Erythromycin Other (See Comments)   unknown   Raspberry Rash   Sulfonamide Derivatives Rash      Medication List       Accurate as of May 04, 2019  3:47 PM. If you have any questions, ask your nurse or doctor.        calcium carbonate 200 MG capsule Take 250 mg by mouth 2  (two) times daily with a meal.   dicyclomine 20 MG tablet Commonly known as: BENTYL Take 1 tablet (20 mg total) by mouth 2 (two) times daily.   iron polysaccharides 150 MG capsule Commonly known as: Ferrex 150 Take 1 capsule (150 mg total) by mouth 2 (two) times daily.   Motegrity 2 MG Tabs Generic drug: Prucalopride Succinate Take 2 mg by mouth daily.   omeprazole 40 MG capsule Commonly known as: PRILOSEC Take 1 capsule (40 mg total) by mouth 2 (two) times daily before a meal.   ondansetron 4 MG disintegrating tablet Commonly known as: Zofran ODT Take 1 tablet (4 mg total) by mouth every 8 (eight) hours as needed for nausea or vomiting.   polyethylene glycol 17 g packet Commonly known as: MIRALAX / GLYCOLAX Take 17 g by mouth daily as needed for mild constipation.   Vitamin D (Cholecalciferol) 25 MCG (1000 UT) Tabs Take 2,000 Units by mouth daily.           Objective:   Physical Exam BP (!) 86/70 (BP Location: Left Arm, Patient Position: Sitting, Cuff Size: Small)   Pulse 98   Temp (!) 97.3 F (36.3 C) (Temporal)   Resp 16   Ht 5\' 3"  (1.6 m)   Wt 90 lb 8 oz (41.1 kg)   SpO2 99%   BMI 16.03 kg/m  General:   Well developed, NAD, BMI noted.  HEENT:  Normocephalic . Face symmetric, atraumatic Lungs:  CTA B Normal respiratory effort, no intercostal retractions, no accessory muscle use. Heart: RRR,  no murmur.  no pretibial edema bilaterally  Abdomen:  Not distended, soft, non-tender. No rebound or rigidity.   Skin: Not pale. Not jaundice Neurologic:  alert & oriented X3.  Speech normal, gait appropriate for age and unassisted Psych--  Cognition and judgment appear intact.  Cooperative with normal attention span and concentration.  Behavior appropriate. No anxious or depressed appearing.     Assessment     ASSESSMENT  Underweight  Per pt:Wt when she finished high school : 120 pounds.  After her second pregnancy in 2013 her weight was 140 pounds.  Since 2013 she started to lose weight steadily, the smallest she has been is 85 pounds. 08-2015: 91 pounds.  05-2016 90 pounds GERD Benign liver cyst  H/o anxiety Gyn: Dr Rogue Bussing @ [facility]  GI:  -EGD 02/14/2014: Duodenum biopsy negative.  No Giardia, no sprue changes.  Stomach: Congested and erythematous on the prepyloric region, BX: no H. pylori, + reactive gastropathy. -Colonoscopy 02/14/2014: one polyp,Bx, Granulation tissue polyp -03/2014: Barium enema normal -Abnormal  liver imaging: CT 01/2016, ultrasound 08-2016 >>   MRI 08-2016 see report; MRI 7/17 /2020 --Chronic intestinal pseudo-obstruction DX 05-2018 in [place].  Felt to be the cause of weight loss. -Follow-up at [facility] as of 02-2019 S/p ESURE 2015  PLAN Acute constipation: Much improved, having regular bowel movements.  She needs a CMP and CBC, today's Friday, our lab is closed, we agreed she will have blood work on Tuesday next week at [facility].  If no blood work is done, she will let me know. Chronic intestinal pseudo- obstruction: Status post recent admission due to acute constipation, now better, to see her GI doctors at [facility] next week.  Currently on Motegrity and MiraLAX twice a day. B12 deficiency: Shot today BP low, feels well today, encouraged fluids Preventive care: Flu shot today RTC April for a physical exam

## 2019-05-04 NOTE — Patient Instructions (Addendum)
   GO TO THE FRONT DESK  Schedule your monthly B12 shots with [place]  You need blood work next week (CMP and CBC) if that is not done at [facility] next week please let me know

## 2019-05-05 NOTE — Assessment & Plan Note (Signed)
Acute constipation: Much improved, having regular bowel movements.  She needs a CMP and CBC, today's Friday, our lab is closed, we agreed she will have blood work on Tuesday next week at [facility].  If no blood work is done, she will let me know. Chronic intestinal pseudo- obstruction: Status post recent admission due to acute constipation, now better, to see her GI doctors at [facility] next week.  Currently on Motegrity and MiraLAX twice a day. B12 deficiency: Shot today BP low, feels well today, encouraged fluids Preventive care: Flu shot today RTC April for a physical exam

## 2019-05-15 ENCOUNTER — Ambulatory Visit: Payer: 59 | Admitting: Internal Medicine

## 2019-06-02 ENCOUNTER — Encounter: Payer: Self-pay | Admitting: Internal Medicine

## 2019-06-04 NOTE — Telephone Encounter (Signed)
Called patient.. Declined appt because she  just wanted medicine

## 2019-06-05 ENCOUNTER — Ambulatory Visit: Payer: 59

## 2019-06-12 ENCOUNTER — Ambulatory Visit (INDEPENDENT_AMBULATORY_CARE_PROVIDER_SITE_OTHER): Payer: 59 | Admitting: *Deleted

## 2019-06-12 ENCOUNTER — Other Ambulatory Visit: Payer: Self-pay

## 2019-06-12 DIAGNOSIS — E539 Vitamin B deficiency, unspecified: Secondary | ICD-10-CM | POA: Diagnosis not present

## 2019-06-12 MED ORDER — CYANOCOBALAMIN 1000 MCG/ML IJ SOLN
1000.0000 ug | Freq: Once | INTRAMUSCULAR | Status: AC
  Administered 2019-06-12: 1000 ug via INTRAMUSCULAR

## 2019-06-12 MED ORDER — CYANOCOBALAMIN 1000 MCG/ML IJ SOLN: 1000.0000 ug | Freq: Once | INTRAMUSCULAR | 0 refills | Status: DC

## 2019-06-12 NOTE — Progress Notes (Signed)
Patient here for monthly b12 injection per Dr. Larose Kells.  Injection given in left deltoid and patient tolerated well.

## 2019-06-13 ENCOUNTER — Telehealth: Payer: Self-pay | Admitting: Family Medicine

## 2019-06-13 NOTE — Telephone Encounter (Signed)
Ok to schedule pt?

## 2019-06-13 NOTE — Telephone Encounter (Signed)
Ok to establish 

## 2019-06-13 NOTE — Telephone Encounter (Signed)
Okay per [place].

## 2019-06-13 NOTE — Telephone Encounter (Signed)
Pt called in asking if you would take her on as a new pt. Your see her brother in law and sister in law Stacy [name]). Pt's pcp is Dr. Larose Kells. Please advise

## 2019-06-13 NOTE — Telephone Encounter (Signed)
FYi

## 2019-06-13 NOTE — Telephone Encounter (Signed)
Ok for TOC. 

## 2019-06-13 NOTE — Telephone Encounter (Signed)
Pt has been scheduled.  °

## 2019-06-13 NOTE — Telephone Encounter (Signed)
Ok to transfer. 

## 2019-06-22 ENCOUNTER — Other Ambulatory Visit: Payer: Self-pay | Admitting: Internal Medicine

## 2019-07-17 ENCOUNTER — Ambulatory Visit (INDEPENDENT_AMBULATORY_CARE_PROVIDER_SITE_OTHER): Payer: 59 | Admitting: Family Medicine

## 2019-07-17 ENCOUNTER — Ambulatory Visit: Payer: 59

## 2019-07-17 ENCOUNTER — Other Ambulatory Visit: Payer: Self-pay

## 2019-07-17 ENCOUNTER — Encounter: Payer: Self-pay | Admitting: Family Medicine

## 2019-07-17 VITALS — BP 99/78 | HR 111 | Temp 97.9°F | Resp 16 | Ht 63.0 in | Wt 94.1 lb

## 2019-07-17 DIAGNOSIS — R636 Underweight: Secondary | ICD-10-CM

## 2019-07-17 DIAGNOSIS — K5989 Other specified functional intestinal disorders: Secondary | ICD-10-CM | POA: Diagnosis not present

## 2019-07-17 DIAGNOSIS — E539 Vitamin B deficiency, unspecified: Secondary | ICD-10-CM | POA: Diagnosis not present

## 2019-07-17 DIAGNOSIS — D509 Iron deficiency anemia, unspecified: Secondary | ICD-10-CM | POA: Diagnosis not present

## 2019-07-17 MED ORDER — CYANOCOBALAMIN 1000 MCG/ML IJ SOLN
1000.0000 ug | Freq: Once | INTRAMUSCULAR | Status: AC
  Administered 2019-07-17: 1000 ug via INTRAMUSCULAR

## 2019-07-17 MED ORDER — TRIAMCINOLONE ACETONIDE 0.1 % EX OINT: 1.0000 "application " | TOPICAL_OINTMENT | Freq: Every day | CUTANEOUS | 1 refills | Status: DC

## 2019-07-17 NOTE — Patient Instructions (Signed)
Schedule your complete physical in 6 months Use the Triamcinolone nightly for the dry skin and apply lotion throughout the day Call with any questions or concerns Stay Safe!  Stay Healthy! Welcome!  We're glad to have you!

## 2019-07-17 NOTE — Progress Notes (Signed)
   Subjective:    Patient ID: Stacy [name], female    DOB: 1980/11/29, 38 y.o.   MRN: [id number]  HPI New to establish.  Previous MD- Paz  B12 deficiency- last level was done in June.  Last shot done 10/27.  Anemia- currently on iron supplementation twice daily and B12 shots monthly.  Pt reports energy level is good, denies fatigue.  No changes to skin/hair/nails.  Intestinal pseudo-obstruction- pt has very narrow digestive tract which causes nausea, vomiting, dehydration.  Follows at [facility].  sxs have been well controlled for last 3 months.  Dry skin- hands bilaterally, no improvement w/ Aveeno.  Review of Systems For ROS see HPI     Objective:   Physical Exam Vitals signs reviewed.  Constitutional:      General: She is not in acute distress.    Appearance: She is well-developed.     Comments: Very thin  HENT:     Head: Normocephalic and atraumatic.  Eyes:     Conjunctiva/sclera: Conjunctivae normal.     Pupils: Pupils are equal, round, and reactive to light.  Neck:     Musculoskeletal: Normal range of motion and neck supple.     Thyroid: No thyromegaly.  Cardiovascular:     Rate and Rhythm: Normal rate and regular rhythm.     Heart sounds: Normal heart sounds. No murmur.  Pulmonary:     Effort: Pulmonary effort is normal. No respiratory distress.     Breath sounds: Normal breath sounds.  Abdominal:     General: There is no distension.     Palpations: Abdomen is soft.     Tenderness: There is no abdominal tenderness.  Lymphadenopathy:     Cervical: No cervical adenopathy.  Skin:    General: Skin is warm and dry.     Comments: Peeling of hands bilaterally due to dry skin  Neurological:     Mental Status: She is alert and oriented to person, place, and time.  Psychiatric:        Behavior: Behavior normal.           Assessment & Plan:  Dry skin- hands bilaterally.  No relief w/ lotion.  Will start topical Triamcinolone.  Pt expressed understanding and is in  agreement w/ plan.

## 2019-07-18 ENCOUNTER — Encounter: Payer: Self-pay | Admitting: Family Medicine

## 2019-07-18 NOTE — Assessment & Plan Note (Signed)
Pt has hx of this.  Denies current fatigue.  Reviewed recent labs from [facility].  No changes at this time

## 2019-07-18 NOTE — Assessment & Plan Note (Signed)
Pt has hx of this and is due for injxn at this time.  Given today.

## 2019-07-18 NOTE — Assessment & Plan Note (Signed)
New to provider, ongoing for pt.  Following at [facility].  Has been doing well for the last 3 months but prior to that had 2 hospitalizations due to intractable N/V.

## 2019-07-18 NOTE — Assessment & Plan Note (Signed)
Ongoing issue for pt due to chronic intestinal pseudo-obstruction.  Due to the small caliber of her GI tract, she has difficulty eating and often struggles w/ nausea and vomiting.  Encouraged nutritional supplements in the form of protein shakes.  Will follow.

## 2019-08-20 ENCOUNTER — Other Ambulatory Visit: Payer: Self-pay

## 2019-08-20 ENCOUNTER — Encounter: Payer: Self-pay | Admitting: Family Medicine

## 2019-08-20 ENCOUNTER — Ambulatory Visit (INDEPENDENT_AMBULATORY_CARE_PROVIDER_SITE_OTHER): Payer: 59

## 2019-08-20 DIAGNOSIS — E538 Deficiency of other specified B group vitamins: Secondary | ICD-10-CM | POA: Diagnosis not present

## 2019-08-20 DIAGNOSIS — E539 Vitamin B deficiency, unspecified: Secondary | ICD-10-CM

## 2019-08-20 MED ORDER — CYANOCOBALAMIN 1000 MCG/ML IJ SOLN
1000.0000 ug | Freq: Once | INTRAMUSCULAR | Status: AC
  Administered 2019-08-20: 1000 ug via INTRAMUSCULAR

## 2019-08-20 NOTE — Progress Notes (Addendum)
Stacy [name] 39 y.o. female presents to office today for monthly B12 per [name], MD. Administered CYANOCOBALAMIN 1,000 mcg IM left arm. Patient tolerated well. Is aware to return next month for her next injection. Will recheck B12 levels at her CPE in March.   The above order is mine.  [name], MD

## 2019-09-15 ENCOUNTER — Other Ambulatory Visit: Payer: Self-pay | Admitting: Internal Medicine

## 2019-09-20 ENCOUNTER — Other Ambulatory Visit: Payer: Self-pay

## 2019-09-20 ENCOUNTER — Ambulatory Visit (INDEPENDENT_AMBULATORY_CARE_PROVIDER_SITE_OTHER): Payer: 59

## 2019-09-20 DIAGNOSIS — E538 Deficiency of other specified B group vitamins: Secondary | ICD-10-CM | POA: Diagnosis not present

## 2019-09-20 MED ORDER — CYANOCOBALAMIN 1000 MCG/ML IJ SOLN
1000.0000 ug | Freq: Once | INTRAMUSCULAR | Status: AC
  Administered 2019-09-20: 1000 ug via INTRAMUSCULAR

## 2019-09-20 NOTE — Addendum Note (Signed)
Addended by: [name] on: 09/20/2019 10:48 AM   Modules accepted: Level of Service

## 2019-09-20 NOTE — Progress Notes (Addendum)
Stacy [name], 39 year old female, presents to the office today for monthly B12 injection per PCP. Patient received Cyanocobalamin 1000 mcg/mL in the left deltoid. Patient tolerated well and was informed to make an appointment for her next B12 injection.  Above order is mine.  [name], MD

## 2019-10-26 ENCOUNTER — Ambulatory Visit (INDEPENDENT_AMBULATORY_CARE_PROVIDER_SITE_OTHER): Payer: 59 | Admitting: Family Medicine

## 2019-10-26 ENCOUNTER — Other Ambulatory Visit: Payer: Self-pay

## 2019-10-26 ENCOUNTER — Encounter: Payer: Self-pay | Admitting: Family Medicine

## 2019-10-26 DIAGNOSIS — R109 Unspecified abdominal pain: Secondary | ICD-10-CM

## 2019-10-26 DIAGNOSIS — K5989 Other specified functional intestinal disorders: Secondary | ICD-10-CM

## 2019-10-26 DIAGNOSIS — R1110 Vomiting, unspecified: Secondary | ICD-10-CM

## 2019-10-26 MED ORDER — ONDANSETRON 4 MG PO TBDP: 4.0000 mg | ORAL_TABLET | Freq: Three times a day (TID) | ORAL | 0 refills | Status: DC | PRN

## 2019-10-26 NOTE — Progress Notes (Signed)
Virtual Visit via Video   I connected with patient on 10/26/19 at 10:00 AM EST by a video enabled telemedicine application and verified that I am speaking with the correct person using two identifiers.  Location patient: Home Location provider: [facility], Office Persons participating in the virtual visit: Patient, Provider, [place] (Jess B)  I discussed the limitations of evaluation and management by telemedicine and the availability of in person appointments. The patient expressed understanding and agreed to proceed.  Subjective:   HPI:   Vomiting- pt reports she 'ate something that didn't agree w/ me' a few days ago and 'I threw up'.  Threw up yesterday AM prior to shot.  Pt reports she had no sxs other than vomiting prior to vaccine.  Got COVID vaccine yesterday.  Since then, has had abdominal pain, vomiting x3.  Pt reports vomiting 'has released the pressure in my stomach' and she is feeling somewhat better.  Pt is trying to hydrate but vomited 'it all up'.  Has had Pedialyte, Gatorade.  Pt had husband report this feels similar to previous pseudo-obstructions.  Had BM last night.  On Motegrity and Miralax.  ROS:   See pertinent positives and negatives per HPI.  Patient Active Problem List   Diagnosis Date Noted  . Abdominal pain with vomiting 04/25/2019  . Syncope due to orthostatic hypotension 04/08/2019  . GERD (gastroesophageal reflux disease) 04/07/2019  . Normocytic anemia 04/07/2019  . 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 Iron deficiency anemia 01/22/2018  . Vitamin B deficiency 01/22/2018  . Vitamin D deficiency 01/22/2018  . PCP NOTES >>>>>>>>>>>>>>>>>>>>>>>>>>>>> 06/28/2017  . Underweight 06/27/2017  . Cyst of ovary 10/05/2016  . Orthostatic hypotension 07/27/2013  . Hepatic adenoma 07/27/2013    Social History   Tobacco Use  . Smoking status: Never Smoker  . Smokeless tobacco: Never Used  Substance Use Topics  . Alcohol use: No   Comment: socially     Current Outpatient Medications:  .  calcium carbonate 200 MG capsule, Take 250 mg by mouth 2 (two) times daily with a meal., Disp: , Rfl:  .  cholecalciferol (VITAMIN D) 25 MCG (1000 UT) tablet, Take 2 tablets (2,000 Units total) by mouth daily., Disp: 180 tablet, Rfl: 3 .  iron polysaccharides (FERREX 150) 150 MG capsule, Take 1 capsule (150 mg total) by mouth 2 (two) times daily., Disp: 180 capsule, Rfl: 3 .  omeprazole (PRILOSEC) 20 MG capsule, TAKE 1 CAPSULE (20 MG TOTAL) BY MOUTH 2 (TWO) TIMES DAILY BEFORE A MEAL., Disp: 180 capsule, Rfl: 2 .  omeprazole (PRILOSEC) 40 MG capsule, Take 1 capsule (40 mg total) by mouth 2 (two) times daily before a meal., Disp: 180 capsule, Rfl: 3 .  ondansetron (ZOFRAN ODT) 4 MG disintegrating tablet, Take 1 tablet (4 mg total) by mouth every 8 (eight) hours as needed for nausea or vomiting., Disp: 20 tablet, Rfl: 0 .  polyethylene glycol (MIRALAX / GLYCOLAX) 17 g packet, Take 17 g by mouth 2 (two) times daily., Disp: , Rfl:  .  Prucalopride Succinate (MOTEGRITY) 2 MG TABS, Take 2 mg by mouth daily., Disp: , Rfl:  .  triamcinolone ointment (KENALOG) 0.1 %, Apply 1 application topically at bedtime., Disp: 90 g, Rfl: 1  Allergies  Allergen Reactions  . Tetracyclines & Related Swelling    Swelling in spine; required spinal tap.   . Erythromycin Other (See Comments)    unknown  . Raspberry Rash  . Sulfonamide Derivatives Rash    Objective:  There were no vitals taken for this visit.  AAOx3, NAD, lying in bed NCAT, EOMI No obvious CN deficits Coloring WNL Pt is able to speak clearly, coherently without shortness of breath or increased work of breathing.  Thought process is linear.  Mood is appropriate.   Assessment and Plan:   Abdominal pain and vomiting- pt reports this is similar to her previous pseudo-obstruction episodes.  She has vomited a total of 4 times in 24 hrs but 3 just this AM.  She reports she is drinking  Pedialyte and Gatorade and trying to stay on top of her fluids.  Will send in Zofran ODT to improve nausea and hopefully slow vomiting.  Reviewed signs of dehydration and when she would need to go to ER.  Pt has been through this many times and reports understanding.  Pt to keep me updated   [name], MD 10/26/2019

## 2019-10-26 NOTE — Progress Notes (Signed)
I have discussed the procedure for the virtual visit with the patient who has given consent to proceed with assessment and treatment.   Pt was vomiting before COVID shot, very sensitive stomach, Abd pain, and has been sick since.   Davis Gourd, CMA

## 2019-11-08 ENCOUNTER — Encounter: Payer: Self-pay | Admitting: Family Medicine

## 2019-11-08 ENCOUNTER — Other Ambulatory Visit: Payer: Self-pay

## 2019-11-08 ENCOUNTER — Ambulatory Visit (INDEPENDENT_AMBULATORY_CARE_PROVIDER_SITE_OTHER): Payer: 59 | Admitting: Family Medicine

## 2019-11-08 ENCOUNTER — Other Ambulatory Visit: Payer: Self-pay | Admitting: Family Medicine

## 2019-11-08 VITALS — BP 122/80 | HR 79 | Temp 97.8°F | Resp 16 | Ht 63.0 in | Wt 94.4 lb

## 2019-11-08 DIAGNOSIS — K2900 Acute gastritis without bleeding: Secondary | ICD-10-CM | POA: Diagnosis not present

## 2019-11-08 DIAGNOSIS — K219 Gastro-esophageal reflux disease without esophagitis: Secondary | ICD-10-CM | POA: Diagnosis not present

## 2019-11-08 DIAGNOSIS — E539 Vitamin B deficiency, unspecified: Secondary | ICD-10-CM | POA: Diagnosis not present

## 2019-11-08 MED ORDER — SUCRALFATE 1 G PO TABS: 1.0000 g | ORAL_TABLET | Freq: Three times a day (TID) | ORAL | 1 refills | Status: DC

## 2019-11-08 MED ORDER — CYANOCOBALAMIN 1000 MCG/ML IJ SOLN
1000.0000 ug | Freq: Once | INTRAMUSCULAR | Status: AC
  Administered 2019-11-08: 1000 ug via INTRAMUSCULAR

## 2019-11-08 NOTE — Assessment & Plan Note (Signed)
Deteriorated.  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  Instructed her to stop NSAIDs.  Increase Omeprazole to 40mg  daily and start Carafate to help w/ symptomatic relief.  After discussing this and leaving the room to type instructions and send prescriptions, pt started vomiting in room.

## 2019-11-08 NOTE — Patient Instructions (Addendum)
Please reschedule your physical at your convenience START the Carafate prior to meals and before bed INCREASE the Omeprazole to 40mg  daily CONTINUE Zofran as needed for nausea Make sure you are drinking plenty of fluids- if you are not able to drink, you need to go to the ER for fluids You need to make your GI doctor aware of what's going on Call with any questions or concerns Hang in there!

## 2019-11-08 NOTE — Assessment & Plan Note (Signed)
Injection given today.  Pt tolerated w/o difficulty.

## 2019-11-08 NOTE — Telephone Encounter (Signed)
Ok for refill? 

## 2019-11-08 NOTE — Progress Notes (Signed)
   Subjective:    Patient ID: Stacy [name], female    DOB: [birth year], 39 y.o.   MRN: [id number]  HPI Epigastric pain- pt reports 'inflammation right here' pointing to epigastric area.  sxs started ~1 week ago.  Increased bloating.  Last night stomach was taut.  Took Zofran last night w/ some relief.  + chills, no documented fever.  No hematemesis or melana.  Has been taking Motrin 3x/day for last 2 weeks.  'it hurts.  It feels like it's on fire'.  Has not called GI.  Pt reports she is able to eat and drink.  Currently on Omeprazole 20mg .  Having regular BMs w/ Miralax and Motegrity.    B12 deficiency- due for injxn today   Review of Systems For ROS see HPI   This visit occurred during the SARS-CoV-2 public health emergency.  Safety protocols were in place, including screening questions prior to the visit, additional usage of staff PPE, and extensive cleaning of exam room while observing appropriate contact time as indicated for disinfecting solutions.       Objective:   Physical Exam Vitals reviewed.  Constitutional:      Comments: thin  HENT:     Head: Normocephalic and atraumatic.  Abdominal:     General: Bowel sounds are normal. There is distension (mild distension).     Palpations: Abdomen is soft.     Tenderness: There is abdominal tenderness in the epigastric area. There is no guarding or rebound.  Skin:    General: Skin is warm and dry.     Capillary Refill: Capillary refill takes less than 2 seconds.  Neurological:     General: No focal deficit present.     Mental Status: She is alert.  Psychiatric:        Mood and Affect: Mood is anxious.           Assessment & Plan:  Gastritis- new.  Likely NSAID induced as she reports taking multiple times each day for the last 2 weeks.  Told her she needed to stop.  Increase PPI to 40mg  daily.  Add Carafate.  Reviewed signs of dehydration that should prompt ER visit and instructed pt to call GI and make them aware.  Pt  expressed understanding and is in agreement w/ plan.

## 2019-11-12 ENCOUNTER — Telehealth: Payer: Self-pay | Admitting: Physician Assistant

## 2019-11-12 ENCOUNTER — Telehealth (INDEPENDENT_AMBULATORY_CARE_PROVIDER_SITE_OTHER): Payer: 59 | Admitting: Physician Assistant

## 2019-11-12 ENCOUNTER — Encounter: Payer: Self-pay | Admitting: Physician Assistant

## 2019-11-12 ENCOUNTER — Other Ambulatory Visit: Payer: Self-pay

## 2019-11-12 DIAGNOSIS — K2990 Gastroduodenitis, unspecified, without bleeding: Secondary | ICD-10-CM | POA: Diagnosis not present

## 2019-11-12 MED ORDER — FAMOTIDINE 20 MG PO TABS: 20.0000 mg | ORAL_TABLET | Freq: Every day | ORAL | 1 refills | Status: DC

## 2019-11-12 MED ORDER — OMEPRAZOLE 40 MG PO CPDR: 40.0000 mg | DELAYED_RELEASE_CAPSULE | Freq: Every day | ORAL | 3 refills | Status: DC

## 2019-11-12 NOTE — Progress Notes (Signed)
I have discussed the procedure for the virtual visit with the patient who has given consent to proceed with assessment and treatment.   Shivaay Stormont S Johnhenry Tippin, CMA     

## 2019-11-12 NOTE — Progress Notes (Signed)
Virtual Visit via Video   I connected with patient on 11/12/19 at 11:00 AM EDT by a video enabled telemedicine application and verified that I am speaking with the correct person using two identifiers.  Location patient: Home Location provider: [facility], Office Persons participating in the virtual visit: Patient, Provider, [place] (Patina Moore)  I discussed the limitations of evaluation and management by telemedicine and the availability of in person appointments. The patient expressed understanding and agreed to proceed.  Subjective:   HPI:   Patient presents via [place] today complaining of continued left upper quadrant discomfort.  Was seen last Thursday by PCP and diagnosed with GERD/gastritis.  Was instructed to increase her omeprazole back to 40 mg daily.  Was also started on Carafate with meals and instructed to contact her gastroenterologist.  Patient endorses following PCPs directions.  Is taking the Carafate 4 times daily as directed.  Has noted she feels fine when taking the medicine, after which she typically waits an hour before eating.  A little while after eating she notes a significant increase in her abdominal pain.  Notes some nausea without vomiting.  Denies melena, tenesmus or hematochezia.  No use of NSAIDs.  Has reached out to her gastroenterologist but has not received a call back yet.  Denies any new or worsening symptoms.  ROS:   See pertinent positives and negatives per HPI.  Patient Active Problem List   Diagnosis Date Noted  . Abdominal pain with vomiting 04/25/2019  . Syncope due to orthostatic hypotension 04/08/2019  . GERD (gastroesophageal reflux disease) 04/07/2019  . Normocytic anemia 04/07/2019  . 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 Iron deficiency anemia 01/22/2018  . Vitamin B deficiency 01/22/2018  . Vitamin D deficiency 01/22/2018  . PCP NOTES >>>>>>>>>>>>>>>>>>>>>>>>>>>>> 06/28/2017  . Underweight 06/27/2017  .  Cyst of ovary 10/05/2016  . Orthostatic hypotension 07/27/2013  . Hepatic adenoma 07/27/2013    Social History   Tobacco Use  . Smoking status: Never Smoker  . Smokeless tobacco: Never Used  Substance Use Topics  . Alcohol use: No    Comment: socially     Current Outpatient Medications:  .  calcium carbonate 200 MG capsule, Take 250 mg by mouth 2 (two) times daily with a meal., Disp: , Rfl:  .  cholecalciferol (VITAMIN D) 25 MCG (1000 UT) tablet, Take 2 tablets (2,000 Units total) by mouth daily., Disp: 180 tablet, Rfl: 3 .  iron polysaccharides (FERREX 150) 150 MG capsule, Take 1 capsule (150 mg total) by mouth 2 (two) times daily., Disp: 180 capsule, Rfl: 3 .  omeprazole (PRILOSEC) 40 MG capsule, Take 1 capsule (40 mg total) by mouth 2 (two) times daily before a meal., Disp: 180 capsule, Rfl: 3 .  ondansetron (ZOFRAN ODT) 4 MG disintegrating tablet, Take 1 tablet (4 mg total) by mouth every 8 (eight) hours as needed for nausea or vomiting., Disp: 30 tablet, Rfl: 0 .  polyethylene glycol (MIRALAX / GLYCOLAX) 17 g packet, Take 17 g by mouth 2 (two) times daily., Disp: , Rfl:  .  Prucalopride Succinate (MOTEGRITY) 2 MG TABS, Take 2 mg by mouth daily., Disp: , Rfl:  .  sucralfate (CARAFATE) 1 g tablet, Take 1 tablet (1 g total) by mouth 4 (four) times daily -  with meals and at bedtime., Disp: 90 tablet, Rfl: 1 .  triamcinolone ointment (KENALOG) 0.1 %, APPLY 1 APPLICATION TOPICALLY AT BEDTIME., Disp: 80 g, Rfl: 1  Allergies  Allergen Reactions  . Tetracyclines &  Related Swelling    Swelling in spine; required spinal tap.   . Erythromycin Other (See Comments)    unknown  . Raspberry Rash  . Sulfonamide Derivatives Rash    Objective:   There were no vitals taken for this visit.  Patient is well-developed, well-nourished in no acute distress.  Resting comfortably at home.  Head is normocephalic, atraumatic.  No labored breathing.  Speech is clear and coherent with logical  content.  Patient is alert and oriented at baseline.   Assessment and Plan:   1. Gastritis and duodenitis With concern of ulceration.  No blood noted in stool per patient.  She is to contact her gastroenterologist again to try to schedule follow-up appointment.  We will continue her Prilosec at 40 mg daily.  We will add on famotidine 20 mg nightly.  Continue Carafate as directed.  In regards to her diet, she is eating things that would trigger increase acid production which is most likely aggravating the ulcer and causing her pain.  Discussed proper diet and hydration.  Strict ER precautions reviewed.  He is to keep me updated on how she is doing and when she is scheduled for an appointment with gastroenterology.    Leeanne Rio, [place] 11/12/2019

## 2019-11-12 NOTE — Telephone Encounter (Signed)
Pt called in asking for a list of foods that she can and can not eat. She said this was discussed at the [facility] today.

## 2019-11-12 NOTE — Telephone Encounter (Signed)
Stacy [name] will send a list of foods to eat and not eat to my chart

## 2019-11-15 ENCOUNTER — Encounter: Payer: 59 | Admitting: Internal Medicine

## 2019-11-29 ENCOUNTER — Telehealth: Payer: Self-pay | Admitting: Family Medicine

## 2019-11-29 NOTE — Telephone Encounter (Signed)
Called and spoke with pt. She advised that she feels the Carafate is not really working. Her dizziness does go away with Gatorade and water intake. Has a heavy feeling in her abdomen. She was advised that if she felt like the Carafate was not working she should refrain form taking it tonight, keep her water intake up and call and let me know if the dizziness came back tomorrow morning. She is also going to call her GI doctor to see if they have any recommendations for a different medication or get an appointment for the "heavy" feeling she is having.   Pt was made aware that if dizziness worsened or other symptoms appeared she should be evaluated.

## 2019-11-29 NOTE — Telephone Encounter (Signed)
Pt called in asking if the Sucralfate could be causing her dizziness. She states that she has been having episodes of dizziness since she started taking this medication. She said it does get better after drinking water. Please advise

## 2019-11-29 NOTE — Telephone Encounter (Signed)
This can be a rare side effect (not typical) but can also be seen with low blood pressure if it is improving w/ increased water/hydration.

## 2019-11-29 NOTE — Telephone Encounter (Signed)
Please advise 

## 2019-11-30 NOTE — Telephone Encounter (Signed)
FYI

## 2019-11-30 NOTE — Telephone Encounter (Signed)
Pt called in stating that she is feeling better and the dizziness has stopped. He was able to make an appt with her GI provider.

## 2019-12-04 ENCOUNTER — Other Ambulatory Visit: Payer: Self-pay | Admitting: Physician Assistant

## 2019-12-06 ENCOUNTER — Ambulatory Visit (INDEPENDENT_AMBULATORY_CARE_PROVIDER_SITE_OTHER): Payer: 59

## 2019-12-06 ENCOUNTER — Other Ambulatory Visit: Payer: Self-pay

## 2019-12-06 DIAGNOSIS — E538 Deficiency of other specified B group vitamins: Secondary | ICD-10-CM | POA: Diagnosis not present

## 2019-12-06 MED ORDER — CYANOCOBALAMIN 1000 MCG/ML IJ SOLN
1000.0000 ug | Freq: Once | INTRAMUSCULAR | Status: AC
  Administered 2019-12-06: 1000 ug via INTRAMUSCULAR

## 2019-12-06 NOTE — Progress Notes (Signed)
Stacy [name], 39 year old female, presents to the office today for monthly B12 injection per PCP. Patient received Cyanocobalamin 1000 mcg/mL in the left deltoid. Patient tolerated well and was informed to make an appointment for her next B12 injection.

## 2019-12-14 ENCOUNTER — Other Ambulatory Visit: Payer: Self-pay | Admitting: Physician Assistant

## 2019-12-28 ENCOUNTER — Other Ambulatory Visit: Payer: Self-pay | Admitting: Family Medicine

## 2019-12-31 ENCOUNTER — Other Ambulatory Visit: Payer: Self-pay | Admitting: Internal Medicine

## 2020-01-01 ENCOUNTER — Other Ambulatory Visit: Payer: Self-pay

## 2020-01-01 ENCOUNTER — Encounter: Payer: Self-pay | Admitting: Family Medicine

## 2020-01-01 ENCOUNTER — Ambulatory Visit (INDEPENDENT_AMBULATORY_CARE_PROVIDER_SITE_OTHER): Payer: 59 | Admitting: Family Medicine

## 2020-01-01 ENCOUNTER — Encounter: Payer: 59 | Admitting: Family Medicine

## 2020-01-01 VITALS — BP 119/78 | HR 79 | Temp 97.9°F | Resp 16 | Ht 63.0 in | Wt 87.1 lb

## 2020-01-01 DIAGNOSIS — R636 Underweight: Secondary | ICD-10-CM

## 2020-01-01 DIAGNOSIS — E539 Vitamin B deficiency, unspecified: Secondary | ICD-10-CM

## 2020-01-01 DIAGNOSIS — Z0000 Encounter for general adult medical examination without abnormal findings: Secondary | ICD-10-CM | POA: Insufficient documentation

## 2020-01-01 DIAGNOSIS — E559 Vitamin D deficiency, unspecified: Secondary | ICD-10-CM

## 2020-01-01 LAB — HEPATIC FUNCTION PANEL
ALT: 7 U/L (ref 0–35)
AST: 9 U/L (ref 0–37)
Albumin: 3 g/dL — ABNORMAL LOW (ref 3.5–5.2)
Alkaline Phosphatase: 74 U/L (ref 39–117)
Bilirubin, Direct: 0.1 mg/dL (ref 0.0–0.3)
Total Bilirubin: 0.4 mg/dL (ref 0.2–1.2)
Total Protein: 5.5 g/dL — ABNORMAL LOW (ref 6.0–8.3)

## 2020-01-01 LAB — CBC WITH DIFFERENTIAL/PLATELET
Basophils Absolute: 0 10*3/uL (ref 0.0–0.1)
Basophils Relative: 0.7 % (ref 0.0–3.0)
Eosinophils Absolute: 0 10*3/uL (ref 0.0–0.7)
Eosinophils Relative: 0.4 % (ref 0.0–5.0)
HCT: 31 % — ABNORMAL LOW (ref 36.0–46.0)
Hemoglobin: 10.4 g/dL — ABNORMAL LOW (ref 12.0–15.0)
Lymphocytes Relative: 26.9 % (ref 12.0–46.0)
Lymphs Abs: 1.9 10*3/uL (ref 0.7–4.0)
MCHC: 33.7 g/dL (ref 30.0–36.0)
MCV: 89.6 fl (ref 78.0–100.0)
Monocytes Absolute: 0.7 10*3/uL (ref 0.1–1.0)
Monocytes Relative: 10.3 % (ref 3.0–12.0)
Neutro Abs: 4.4 10*3/uL (ref 1.4–7.7)
Neutrophils Relative %: 61.7 % (ref 43.0–77.0)
Platelets: 434 10*3/uL — ABNORMAL HIGH (ref 150.0–400.0)
RBC: 3.46 Mil/uL — ABNORMAL LOW (ref 3.87–5.11)
RDW: 15.2 % (ref 11.5–15.5)
WBC: 7.1 10*3/uL (ref 4.0–10.5)

## 2020-01-01 LAB — LIPID PANEL
Cholesterol: 123 mg/dL (ref 0–200)
HDL: 50.4 mg/dL (ref >39.00)
LDL Cholesterol: 61 mg/dL (ref 0–99)
NonHDL: 72.68
Total CHOL/HDL Ratio: 2
Triglycerides: 60 mg/dL (ref 0.0–149.0)
VLDL: 12 mg/dL (ref 0.0–40.0)

## 2020-01-01 LAB — BASIC METABOLIC PANEL
BUN: 18 mg/dL (ref 6–23)
CO2: 31 mEq/L (ref 19–32)
Calcium: 8.3 mg/dL — ABNORMAL LOW (ref 8.4–10.5)
Chloride: 101 mEq/L (ref 96–112)
Creatinine, Ser: 0.49 mg/dL (ref 0.40–1.20)
GFR: 140.01 mL/min (ref >60.00)
Glucose, Bld: 75 mg/dL (ref 70–99)
Potassium: 4.3 mEq/L (ref 3.5–5.1)
Sodium: 136 mEq/L (ref 135–145)

## 2020-01-01 MED ORDER — CYANOCOBALAMIN 1000 MCG/ML IJ SOLN
1000.0000 ug | Freq: Once | INTRAMUSCULAR | Status: AC
  Administered 2020-01-01: 1000 ug via INTRAMUSCULAR

## 2020-01-01 NOTE — Addendum Note (Signed)
Addended by: Fritz Pickerel on: 01/01/2020 10:48 AM   Modules accepted: Orders

## 2020-01-01 NOTE — Assessment & Plan Note (Signed)
Check labs and replete prn. 

## 2020-01-01 NOTE — Assessment & Plan Note (Signed)
Deteriorated.  Pt is down another 7 lbs and BMI is 15.43  At this point, I wonder if TPN or hospitalization for re-feeding is necessary.  She has appt w/ GI next week and I encouraged her to ask about TPN as a possibility as I have not had to initiate this for someone.  Depending on the outcome of her GI appt may need to refer to an academic center for ongoing treatment.  Pt expressed understanding and is in agreement w/ plan.

## 2020-01-01 NOTE — Assessment & Plan Note (Signed)
Pt is cachectic but otherwise PE WNL.  I have concerns that her severe protein calorie malnutrition is going to have medical consequences in the near future.  Encouraged her to discuss TPN or other alternatives w/ GI.  UTD on pap, immunizations.  Check labs.  Anticipatory guidance provided.

## 2020-01-01 NOTE — Assessment & Plan Note (Signed)
Due for B12 shot.  Will check levels as well.

## 2020-01-01 NOTE — Progress Notes (Signed)
   Subjective:    Patient ID: Stacy [name], female    DOB: March 07, 1981, 39 y.o.   MRN: [id number]  HPI CPE- UTD on pap, immunizations.  Pt has lost another 7 lbs and is down to 87lbs.  Pt has appt next week w/ GI- at times is not able to eat b/c of the bloating and subsequent vomiting.  Otherwise reports eating breakfast, lunch, and dinner daily   Review of Systems Patient reports no vision/ hearing changes, adenopathy,fever, persistant/recurrent hoarseness , swallowing issues, chest pain, palpitations, edema, persistant/recurrent cough, hemoptysis, dyspnea (rest/exertional/paroxysmal nocturnal), gastrointestinal bleeding (melena, rectal bleeding), significant heartburn, bowel changes, GU symptoms (dysuria, hematuria, incontinence), Gyn symptoms (abnormal  bleeding, pain),  syncope, focal weakness, memory loss, numbness & tingling, skin/hair/nail changes, abnormal bruising or bleeding, anxiety, or depression.     Objective:   Physical Exam General Appearance:    Alert, cooperative, no distress, appears older than stated age, cachectic  Head:    Normocephalic, without obvious abnormality, atraumatic  Eyes:    PERRL, conjunctiva/corneas clear, EOM's intact, fundi    benign, both eyes  Ears:    Normal TM's and external ear canals, both ears  Nose:   Deferred due to COVID  Throat:   Neck:   Supple, symmetrical, trachea midline, no adenopathy;    Thyroid: no enlargement/tenderness/nodules  Back:     Symmetric, no curvature, ROM normal, no CVA tenderness  Lungs:     Clear to auscultation bilaterally, respirations unlabored  Chest Wall:    No tenderness or deformity   Heart:    Regular rate and rhythm, S1 and S2 normal, no murmur, rub   or gallop  Breast Exam:    Deferred to GYN  Abdomen:     Soft, non-tender, bowel sounds active all four quadrants,    no masses, no organomegaly  Genitalia:    Deferred to GYN  Rectal:    Extremities:   Extremities normal, atraumatic, no cyanosis or edema    Pulses:   2+ and symmetric all extremities  Skin:   Skin color, texture, turgor normal, no rashes or lesions  Lymph nodes:   Cervical, supraclavicular, and axillary nodes normal  Neurologic:   CNII-XII intact, normal strength, sensation and reflexes    throughout          Assessment & Plan:

## 2020-01-01 NOTE — Patient Instructions (Addendum)
Follow up in 2-3 months to recheck weight gain progress We'll notify you of your lab results and make any changes if needed Please ask your GI doctor about the possibility of TPN (IV nutrition) or other forms of alternative nutrition Call with any questions or concerns Hang in there!

## 2020-01-02 LAB — VITAMIN D 25 HYDROXY (VIT D DEFICIENCY, FRACTURES): VITD: 33.38 ng/mL (ref 30.00–100.00)

## 2020-01-02 LAB — VITAMIN B12: Vitamin B-12: 267 pg/mL (ref 211–911)

## 2020-01-02 LAB — TSH: TSH: 1.08 u[IU]/mL (ref 0.35–4.50)

## 2020-01-18 ENCOUNTER — Telehealth (INDEPENDENT_AMBULATORY_CARE_PROVIDER_SITE_OTHER): Payer: 59 | Admitting: Family Medicine

## 2020-01-18 ENCOUNTER — Encounter: Payer: Self-pay | Admitting: Family Medicine

## 2020-01-18 ENCOUNTER — Other Ambulatory Visit: Payer: Self-pay

## 2020-01-18 DIAGNOSIS — J069 Acute upper respiratory infection, unspecified: Secondary | ICD-10-CM

## 2020-01-18 MED ORDER — GUAIFENESIN-CODEINE 100-10 MG/5ML PO SYRP: 5.0000 mL | ORAL_SOLUTION | Freq: Three times a day (TID) | ORAL | 0 refills | Status: DC | PRN

## 2020-01-18 MED ORDER — AMOXICILLIN 875 MG PO TABS
875.0000 mg | ORAL_TABLET | Freq: Two times a day (BID) | ORAL | 0 refills | Status: DC
Start: 2020-01-18 — End: 2020-04-03

## 2020-01-18 NOTE — Progress Notes (Signed)
Virtual Visit via Video   I connected with patient on 01/18/20 at  9:30 AM EDT by a video enabled telemedicine application and verified that I am speaking with the correct person using two identifiers.  Location patient: Home Location provider: Acupuncturist, Office Persons participating in the virtual visit: Patient, Provider, [place] (Jess B)  I discussed the limitations of evaluation and management by telemedicine and the availability of in person appointments. The patient expressed understanding and agreed to proceed.  Subjective:   HPI:   URI- 'I have sinuses.  My head hurts, my nose is on fire, and I'm coughing up yellow phlegm'.  Son has PNA and is on abx.  No fever but chills this AM.  + frontal sinus pain.  No tooth pain.  No ear pain but 'popping' yesterday.  No SOB.  Sxs started 3 days ago and are worsening.  ROS:   See pertinent positives and negatives per HPI.  Patient Active Problem List   Diagnosis Date Noted  . Physical exam 01/01/2020  . Abdominal pain with vomiting 04/25/2019  . GERD (gastroesophageal reflux disease) 04/07/2019  . Normocytic anemia 04/07/2019  . 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 Iron deficiency anemia 01/22/2018  . Vitamin B deficiency 01/22/2018  . Vitamin D deficiency 01/22/2018  . PCP NOTES >>>>>>>>>>>>>>>>>>>>>>>>>>>>> 06/28/2017  . Underweight 06/27/2017  . Cyst of ovary 10/05/2016  . Orthostatic hypotension 07/27/2013  . Hepatic adenoma 07/27/2013    Social History   Tobacco Use  . Smoking status: Never Smoker  . Smokeless tobacco: Never Used  Substance Use Topics  . Alcohol use: No    Comment: socially     Current Outpatient Medications:  .  calcium carbonate 200 MG capsule, Take 250 mg by mouth 2 (two) times daily with a meal., Disp: , Rfl:  .  cholecalciferol (VITAMIN D3) 25 MCG (1000 UNIT) tablet, TAKE 2 TABLETS (2,000 UNITS TOTAL) BY MOUTH DAILY., Disp: 200 tablet, Rfl: 3 .  famotidine (PEPCID) 20  MG tablet, TAKE 1 TABLET BY MOUTH EVERYDAY AT BEDTIME, Disp: 30 tablet, Rfl: 1 .  iron polysaccharides (FERREX 150) 150 MG capsule, Take 1 capsule (150 mg total) by mouth 2 (two) times daily., Disp: 180 capsule, Rfl: 3 .  montelukast (SINGULAIR) 10 MG tablet, Take 10 mg by mouth daily., Disp: , Rfl:  .  omeprazole (PRILOSEC) 40 MG capsule, TAKE 1 CAPSULE BY MOUTH EVERY DAY, Disp: 90 capsule, Rfl: 1 .  ondansetron (ZOFRAN ODT) 4 MG disintegrating tablet, Take 1 tablet (4 mg total) by mouth every 8 (eight) hours as needed for nausea or vomiting., Disp: 30 tablet, Rfl: 0 .  polyethylene glycol (MIRALAX / GLYCOLAX) 17 g packet, Take 17 g by mouth 2 (two) times daily., Disp: , Rfl:  .  Prucalopride Succinate (MOTEGRITY) 2 MG TABS, Take 2 mg by mouth daily., Disp: , Rfl:  .  sucralfate (CARAFATE) 1 g tablet, Take 1 tablet (1 g total) by mouth 4 (four) times daily -  with meals and at bedtime., Disp: 90 tablet, Rfl: 1 .  triamcinolone ointment (KENALOG) 0.1 %, APPLY 1 APPLICATION TOPICALLY AT BEDTIME., Disp: 80 g, Rfl: 1  Allergies  Allergen Reactions  . Tetracyclines & Related Swelling    Swelling in spine; required spinal tap.   . Erythromycin Other (See Comments)    unknown  . Raspberry Rash  . Sulfonamide Derivatives Rash    Objective:   There were no vitals taken for this visit.  AAOx3, NAD NCAT,  EOMI No obvious CN deficits Coloring WNL Pt is able to speak clearly, coherently without shortness of breath or increased work of breathing.  + wet, hacking cough Thought process is linear.  Mood is appropriate.   Assessment and Plan:   URI- new.  Given that her son has PNA and her sxs have been worsening over the last few days- developing chills this AM- will start abx to cover possible bacterial infxn.  Cough meds prn.  Reviewed supportive care and red flags that should prompt return.  Pt expressed understanding and is in agreement w/ plan.    Stacy Asa, MD 01/18/2020

## 2020-01-18 NOTE — Progress Notes (Signed)
I have discussed the procedure for the virtual visit with the patient who has given consent to proceed with assessment and treatment.   Pt unable to obtain vitals.   Ercell Razon L Luisantonio Adinolfi, CMA     

## 2020-01-22 ENCOUNTER — Other Ambulatory Visit: Payer: Self-pay | Admitting: Family Medicine

## 2020-02-01 ENCOUNTER — Other Ambulatory Visit: Payer: Self-pay

## 2020-02-01 ENCOUNTER — Ambulatory Visit (INDEPENDENT_AMBULATORY_CARE_PROVIDER_SITE_OTHER): Payer: 59

## 2020-02-01 DIAGNOSIS — E559 Vitamin D deficiency, unspecified: Secondary | ICD-10-CM

## 2020-02-01 MED ORDER — CYANOCOBALAMIN 1000 MCG/ML IJ SOLN
1000.0000 ug | Freq: Once | INTRAMUSCULAR | Status: AC
  Administered 2020-02-01: 1000 ug via INTRAMUSCULAR

## 2020-02-01 NOTE — Addendum Note (Signed)
Addended by: [name] on: 02/01/2020 04:42 PM   Modules accepted: Level of Service

## 2020-02-01 NOTE — Progress Notes (Addendum)
Stacy [name], 39 year old female, presents to the office today for monthly B12 injection per PCP. Patient received Cyanocobalamin 1000 mcg/mL in the left deltoid. Patient tolerated well and was informed to make an appointment for her next B12 injection.  Fritz Pickerel, LPN  The above order is mine.  [name], MD

## 2020-02-17 ENCOUNTER — Other Ambulatory Visit: Payer: Self-pay | Admitting: Family Medicine

## 2020-02-21 ENCOUNTER — Telehealth: Payer: Self-pay | Admitting: Family Medicine

## 2020-02-21 NOTE — Telephone Encounter (Signed)
Patient called back and I read Stacy [name]'s note from today to her.  She said she really appreciates the help

## 2020-02-21 NOTE — Telephone Encounter (Signed)
Reviewing in PCP absence. There are a lot of options in the area -- we do have counseling through [facility]. She can call [phone] to schedule an appointment and they will match her up to the appropriate provider based on needs. There is also [facility] that does counseling/behavioral therapy for eating disorders at [phone]. Hope this helps!

## 2020-02-21 NOTE — Telephone Encounter (Signed)
Left a vm message for the patient to call the office back. 

## 2020-02-21 NOTE — Telephone Encounter (Signed)
Would like to know who you would recommend for a therapist for an eating disorder?  Please advise

## 2020-02-29 ENCOUNTER — Other Ambulatory Visit: Payer: Self-pay

## 2020-02-29 ENCOUNTER — Ambulatory Visit (INDEPENDENT_AMBULATORY_CARE_PROVIDER_SITE_OTHER): Payer: 59

## 2020-02-29 DIAGNOSIS — E538 Deficiency of other specified B group vitamins: Secondary | ICD-10-CM

## 2020-02-29 MED ORDER — CYANOCOBALAMIN 1000 MCG/ML IJ SOLN
1000.0000 ug | Freq: Once | INTRAMUSCULAR | Status: AC
  Administered 2020-02-29: 1000 ug via INTRAMUSCULAR

## 2020-02-29 NOTE — Addendum Note (Signed)
Addended by: [name] on: 02/29/2020 09:42 AM   Modules accepted: Level of Service

## 2020-02-29 NOTE — Progress Notes (Addendum)
Stacy [name], 39 year old female, presents to the office today for monthly B12 injection per PCP. Patient received Cyanocobalamin 1000 mcg/mL in the left deltoid. Patient tolerated well and was informed to make an appointment for her next B12 injection. Fritz Pickerel, LPN  The above order is mine.  [name], MD

## 2020-03-18 ENCOUNTER — Other Ambulatory Visit: Payer: Self-pay | Admitting: Family Medicine

## 2020-04-03 ENCOUNTER — Other Ambulatory Visit: Payer: Self-pay

## 2020-04-03 ENCOUNTER — Encounter: Payer: Self-pay | Admitting: Family Medicine

## 2020-04-03 ENCOUNTER — Ambulatory Visit: Payer: 59 | Admitting: Family Medicine

## 2020-04-03 VITALS — BP 90/58 | HR 104 | Temp 98.0°F | Ht 63.0 in | Wt 88.0 lb

## 2020-04-03 DIAGNOSIS — R109 Unspecified abdominal pain: Secondary | ICD-10-CM

## 2020-04-03 DIAGNOSIS — R1110 Vomiting, unspecified: Secondary | ICD-10-CM

## 2020-04-03 DIAGNOSIS — E538 Deficiency of other specified B group vitamins: Secondary | ICD-10-CM

## 2020-04-03 DIAGNOSIS — E539 Vitamin B deficiency, unspecified: Secondary | ICD-10-CM | POA: Diagnosis not present

## 2020-04-03 DIAGNOSIS — R636 Underweight: Secondary | ICD-10-CM | POA: Diagnosis not present

## 2020-04-03 MED ORDER — CYANOCOBALAMIN 1000 MCG/ML IJ SOLN
1000.0000 ug | Freq: Once | INTRAMUSCULAR | Status: AC
  Administered 2020-04-03: 1000 ug via INTRAMUSCULAR

## 2020-04-03 NOTE — Progress Notes (Signed)
° °  Subjective:    Patient ID: Stacy [name], female    DOB: 1981-01-09, 39 y.o.   MRN: [id number]  HPI B12 deficiency- due for B12 injxn today  Underweight- pt has gained 2 lbs since last visit.  Pt has added a plant based protein shake.  Eating 3x/day, 'as much as I can'.  Had scrambled eggs w/ mixed vegetables, pita bread, [place] sausage this AM.  Abd pain w/ vomiting- pt reports overall doing better.  Taking Align which she feels is helping.  Has had some episodes of vomiting depending on what she eats.  Bloating is less.  COVID vaccines- pt has had both doses.   Review of Systems For ROS see HPI   This visit occurred during the SARS-CoV-2 public health emergency.  Safety protocols were in place, including screening questions prior to the visit, additional usage of staff PPE, and extensive cleaning of exam room while observing appropriate contact time as indicated for disinfecting solutions.       Objective:   Physical Exam Vitals reviewed.  Constitutional:      General: She is not in acute distress.    Appearance: She is well-developed.     Comments: Cachectic  HENT:     Head: Normocephalic and atraumatic.  Eyes:     Conjunctiva/sclera: Conjunctivae normal.     Pupils: Pupils are equal, round, and reactive to light.  Neck:     Thyroid: No thyromegaly.  Cardiovascular:     Rate and Rhythm: Normal rate and regular rhythm.     Heart sounds: Normal heart sounds. No murmur heard.   Pulmonary:     Effort: Pulmonary effort is normal. No respiratory distress.     Breath sounds: Normal breath sounds.  Abdominal:     General: Bowel sounds are normal. There is no distension.     Palpations: Abdomen is soft.     Tenderness: There is no abdominal tenderness.  Musculoskeletal:     Cervical back: Normal range of motion and neck supple.  Lymphadenopathy:     Cervical: No cervical adenopathy.  Skin:    General: Skin is warm and dry.  Neurological:     Mental Status: She is  alert and oriented to person, place, and time.  Psychiatric:        Behavior: Behavior normal.           Assessment & Plan:

## 2020-04-03 NOTE — Assessment & Plan Note (Signed)
Pt has gained 2 lbs since last visit and is making a concerted effort to eat at least 3x/day and has added protein shakes.  This is definitely a step in the right direction and applauded her efforts.  Will follow.

## 2020-04-03 NOTE — Assessment & Plan Note (Signed)
Improving.  Continues to have some bloating and distension.  Will add Beano prior to meals to see if this improves sxs.

## 2020-04-03 NOTE — Patient Instructions (Addendum)
Follow up as needed or schedule your complete physical for after May 18th Keep up the good work on regular eating!  You're doing great! I'm so glad that you've added the protein shakes/bars Try adding Beano before each meal to decrease bloating and distension Call with any questions or concerns Stay Safe!  Stay Healthy!!!

## 2020-04-09 ENCOUNTER — Other Ambulatory Visit: Payer: Self-pay | Admitting: Family Medicine

## 2020-04-11 ENCOUNTER — Telehealth: Payer: 59 | Admitting: Physician Assistant

## 2020-04-11 ENCOUNTER — Other Ambulatory Visit: Payer: Self-pay | Admitting: Internal Medicine

## 2020-04-22 ENCOUNTER — Encounter: Payer: Self-pay | Admitting: Family Medicine

## 2020-04-22 ENCOUNTER — Telehealth (INDEPENDENT_AMBULATORY_CARE_PROVIDER_SITE_OTHER): Payer: 59 | Admitting: Family Medicine

## 2020-04-22 ENCOUNTER — Other Ambulatory Visit: Payer: Self-pay

## 2020-04-22 VITALS — Temp 98.2°F | Wt 88.0 lb

## 2020-04-22 DIAGNOSIS — R509 Fever, unspecified: Secondary | ICD-10-CM | POA: Diagnosis not present

## 2020-04-22 DIAGNOSIS — R112 Nausea with vomiting, unspecified: Secondary | ICD-10-CM

## 2020-04-22 NOTE — Progress Notes (Signed)
I have discussed the procedure for the virtual visit with the patient who has given consent to proceed with assessment and treatment.   Garvey Westcott L Athen Riel, CMA     

## 2020-04-22 NOTE — Progress Notes (Signed)
Virtual Visit via Video   I connected with patient on 04/22/20 at 11:30 AM EDT by a video enabled telemedicine application and verified that I am speaking with the correct person using two identifiers.  Location patient: Home Location provider: [facility], Office Persons participating in the virtual visit: Patient, Provider, [place] (Jess B)  I discussed the limitations of evaluation and management by telemedicine and the availability of in person appointments. The patient expressed understanding and agreed to proceed.  Subjective:   HPI:   N/V- pt reports she had UTI last week, tested negative for COVID last week.  Had chills yesterday.  Proceeded to go to a pool party.  Came home, threw up.  Had temp to 100.9.  Drank Pedialyte and again vomited.  Took Tylenol this AM and temp is now normal but continues to have chills.  Denies body aches.  No HA.  + fatigue.  Denies current diarrhea but had last week.  No known COVID contacts but son w/ nasal congestion and 'sniffles'.  Home test last night negative.  Currently able to tolerate fluids.  Eating toast and crackers.  Reports she is 'starving'.  ROS:   See pertinent positives and negatives per HPI.  Patient Active Problem List   Diagnosis Date Noted  . Physical exam 01/01/2020  . Abdominal pain with vomiting 04/25/2019  . GERD (gastroesophageal reflux disease) 04/07/2019  . Normocytic anemia 04/07/2019  . 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 Iron deficiency anemia 01/22/2018  . Vitamin B deficiency 01/22/2018  . Vitamin D deficiency 01/22/2018  . PCP NOTES >>>>>>>>>>>>>>>>>>>>>>>>>>>>> 06/28/2017  . Underweight 06/27/2017  . Cyst of ovary 10/05/2016  . Orthostatic hypotension 07/27/2013  . Hepatic adenoma 07/27/2013    Social History   Tobacco Use  . Smoking status: Never Smoker  . Smokeless tobacco: Never Used  Substance Use Topics  . Alcohol use: No    Comment: socially     Current Outpatient  Medications:  .  calcium carbonate 200 MG capsule, Take 250 mg by mouth 2 (two) times daily with a meal., Disp: , Rfl:  .  cholecalciferol (VITAMIN D3) 25 MCG (1000 UNIT) tablet, TAKE 2 TABLETS (2,000 UNITS TOTAL) BY MOUTH DAILY., Disp: 200 tablet, Rfl: 3 .  famotidine (PEPCID) 20 MG tablet, TAKE 1 TABLET BY MOUTH EVERYDAY AT BEDTIME, Disp: 30 tablet, Rfl: 6 .  guaiFENesin-codeine (ROBITUSSIN AC) 100-10 MG/5ML syrup, Take 5 mLs by mouth 3 (three) times daily as needed for cough., Disp: 120 mL, Rfl: 0 .  montelukast (SINGULAIR) 10 MG tablet, Take 10 mg by mouth daily., Disp: , Rfl:  .  omeprazole (PRILOSEC) 40 MG capsule, TAKE 1 CAPSULE BY MOUTH EVERY DAY, Disp: 90 capsule, Rfl: 1 .  ondansetron (ZOFRAN ODT) 4 MG disintegrating tablet, Take 1 tablet (4 mg total) by mouth every 8 (eight) hours as needed for nausea or vomiting., Disp: 30 tablet, Rfl: 0 .  POLY-IRON 150 150 MG capsule, TAKE 1 CAPSULE BY MOUTH TWICE A DAY, Disp: 60 capsule, Rfl: 11 .  polyethylene glycol (MIRALAX / GLYCOLAX) 17 g packet, Take 17 g by mouth 2 (two) times daily., Disp: , Rfl:  .  Prucalopride Succinate (MOTEGRITY) 2 MG TABS, Take 2 mg by mouth daily., Disp: , Rfl:  .  sucralfate (CARAFATE) 1 g tablet, Take 1 tablet (1 g total) by mouth 4 (four) times daily -  with meals and at bedtime., Disp: 90 tablet, Rfl: 1 .  triamcinolone ointment (KENALOG) 0.1 %, APPLY 1 APPLICATION  TOPICALLY AT BEDTIME., Disp: 80 g, Rfl: 1  Allergies  Allergen Reactions  . Tetracyclines & Related Swelling    Swelling in spine; required spinal tap.   . Erythromycin Other (See Comments)    unknown  . Raspberry Rash  . Sulfonamide Derivatives Rash    Objective:   Temp 98.2 F (36.8 C) (Tympanic)   Wt 88 lb (39.9 kg)   BMI 15.59 kg/m  AAOx3, NAD Very thin Pt is able to speak clearly, coherently without shortness of breath or increased work of breathing. No cough heard Thought process is linear.  Mood is appropriate.   Assessment  and Plan:   Fever/N/V- pt reports she doesn't typically get a fever w/ her pseudo-obstruction episodes.  She reports her UTI sxs cleared after she finished her abx last week.  Then yesterday developed chills, fever, and vomiting.  She had a negative COVID home test but discussed that these have had issues w/ reliability.  Encouraged her to drink plenty of fluids, use the Zofran as needed.  Take the f/u home test 36 hrs after the first one and monitor the rest of the family for sxs.  Reviewed supportive care and red flags that should prompt return.  Pt expressed understanding and is in agreement w/ plan.    [name], MD 04/22/2020

## 2020-04-23 ENCOUNTER — Telehealth: Payer: Self-pay | Admitting: Family Medicine

## 2020-04-23 ENCOUNTER — Telehealth: Payer: 59 | Admitting: Family Medicine

## 2020-04-23 NOTE — Telephone Encounter (Signed)
Pt called in stating that she took another home covid test and it was neg.  She is still having a low gd fever 99.7, 100.   She wanted to know if Dr. Birdie Riddle could send her in an antibiotic, to help take care of the fever? Pt uses [facility] in [place].  Pt can be reached at the cell #

## 2020-04-23 NOTE — Telephone Encounter (Signed)
Antibiotics do not treat fever.  They treat bacterial infections.  Most low grade fevers are viral and do not respond to antibiotics.  Also, if sxs continue, she needs to schedule an actual PCR (send out test) rather than use a home test (which are less accurate)

## 2020-04-23 NOTE — Telephone Encounter (Signed)
FYI

## 2020-04-23 NOTE — Telephone Encounter (Signed)
Read patient Dr. Virgil Benedict note - She voiced understanding and hung up on me

## 2020-04-23 NOTE — Telephone Encounter (Signed)
Please advise 

## 2020-04-26 ENCOUNTER — Emergency Department (HOSPITAL_BASED_OUTPATIENT_CLINIC_OR_DEPARTMENT_OTHER): Payer: 59

## 2020-04-26 ENCOUNTER — Encounter (HOSPITAL_BASED_OUTPATIENT_CLINIC_OR_DEPARTMENT_OTHER): Payer: Self-pay

## 2020-04-26 ENCOUNTER — Emergency Department (HOSPITAL_BASED_OUTPATIENT_CLINIC_OR_DEPARTMENT_OTHER)
Admission: EM | Admit: 2020-04-26 | Discharge: 2020-04-26 | Disposition: A | Discharge status: Home / Self Care | Payer: 59 | Source: Home / Self Care | Attending: Emergency Medicine | Admitting: Emergency Medicine

## 2020-04-26 ENCOUNTER — Other Ambulatory Visit: Payer: Self-pay

## 2020-04-26 DIAGNOSIS — R112 Nausea with vomiting, unspecified: Secondary | ICD-10-CM

## 2020-04-26 DIAGNOSIS — R627 Adult failure to thrive: Secondary | ICD-10-CM | POA: Diagnosis not present

## 2020-04-26 DIAGNOSIS — R109 Unspecified abdominal pain: Secondary | ICD-10-CM | POA: Diagnosis not present

## 2020-04-26 DIAGNOSIS — R000 Tachycardia, unspecified: Secondary | ICD-10-CM | POA: Insufficient documentation

## 2020-04-26 DIAGNOSIS — Z20822 Contact with and (suspected) exposure to covid-19: Secondary | ICD-10-CM | POA: Insufficient documentation

## 2020-04-26 DIAGNOSIS — Z79899 Other long term (current) drug therapy: Secondary | ICD-10-CM | POA: Insufficient documentation

## 2020-04-26 LAB — URINALYSIS, ROUTINE W REFLEX MICROSCOPIC
Glucose, UA: NEGATIVE mg/dL
Ketones, ur: 15 mg/dL — AB
Leukocytes,Ua: NEGATIVE
Nitrite: NEGATIVE
Protein, ur: NEGATIVE mg/dL
Specific Gravity, Urine: 1.025 (ref 1.005–1.030)
pH: 6 (ref 5.0–8.0)

## 2020-04-26 LAB — COMPREHENSIVE METABOLIC PANEL
ALT: 15 U/L (ref 0–44)
AST: 13 U/L — ABNORMAL LOW (ref 15–41)
Albumin: 2.1 g/dL — ABNORMAL LOW (ref 3.5–5.0)
Alkaline Phosphatase: 100 U/L (ref 38–126)
Anion gap: 10 (ref 5–15)
BUN: 21 mg/dL — ABNORMAL HIGH (ref 6–20)
CO2: 21 mmol/L — ABNORMAL LOW (ref 22–32)
Calcium: 7.9 mg/dL — ABNORMAL LOW (ref 8.9–10.3)
Chloride: 102 mmol/L (ref 98–111)
Creatinine, Ser: 0.48 mg/dL (ref 0.44–1.00)
GFR calc Af Amer: >60 mL/min (ref >60)
GFR calc non Af Amer: >60 mL/min (ref >60)
Glucose, Bld: 92 mg/dL (ref 70–99)
Potassium: 3.8 mmol/L (ref 3.5–5.1)
Sodium: 133 mmol/L — ABNORMAL LOW (ref 135–145)
Total Bilirubin: 1 mg/dL (ref 0.3–1.2)
Total Protein: 5.6 g/dL — ABNORMAL LOW (ref 6.5–8.1)

## 2020-04-26 LAB — CBC
HCT: 30.5 % — ABNORMAL LOW (ref 36.0–46.0)
Hemoglobin: 10.1 g/dL — ABNORMAL LOW (ref 12.0–15.0)
MCH: 28.1 pg (ref 26.0–34.0)
MCHC: 33.1 g/dL (ref 30.0–36.0)
MCV: 84.7 fL (ref 80.0–100.0)
Platelets: 478 10*3/uL — ABNORMAL HIGH (ref 150–400)
RBC: 3.6 MIL/uL — ABNORMAL LOW (ref 3.87–5.11)
RDW: 12.8 % (ref 11.5–15.5)
WBC: 9.4 10*3/uL (ref 4.0–10.5)
nRBC: 0 % (ref 0.0–0.2)

## 2020-04-26 LAB — URINALYSIS, MICROSCOPIC (REFLEX)

## 2020-04-26 LAB — PREGNANCY, URINE: Preg Test, Ur: NEGATIVE

## 2020-04-26 LAB — LIPASE, BLOOD: Lipase: 17 U/L (ref 11–51)

## 2020-04-26 LAB — SARS CORONAVIRUS 2 BY RT PCR (HOSPITAL ORDER, PERFORMED IN ~~LOC~~ HOSPITAL LAB): SARS Coronavirus 2: NEGATIVE

## 2020-04-26 MED ORDER — ONDANSETRON HCL 4 MG PO TABS: 4.0000 mg | ORAL_TABLET | Freq: Three times a day (TID) | ORAL | 0 refills | Status: DC | PRN

## 2020-04-26 MED ORDER — LORAZEPAM 2 MG/ML IJ SOLN
0.5000 mg | Freq: Once | INTRAMUSCULAR | Status: AC
  Administered 2020-04-26: 0.5 mg via INTRAVENOUS
  Filled 2020-04-26: qty 1

## 2020-04-26 MED ORDER — ONDANSETRON HCL 4 MG/2ML IJ SOLN
4.0000 mg | Freq: Once | INTRAMUSCULAR | Status: AC | PRN
  Administered 2020-04-26: 4 mg via INTRAVENOUS
  Filled 2020-04-26: qty 2

## 2020-04-26 MED ORDER — IOHEXOL 300 MG/ML  SOLN
75.0000 mL | Freq: Once | INTRAMUSCULAR | Status: AC | PRN
  Administered 2020-04-26: 75 mL via INTRAVENOUS

## 2020-04-26 MED ORDER — LACTATED RINGERS IV BOLUS
1000.0000 mL | Freq: Once | INTRAVENOUS | Status: AC
  Administered 2020-04-26: 1000 mL via INTRAVENOUS

## 2020-04-26 NOTE — ED Provider Notes (Signed)
[facility] EMERGENCY DEPARTMENT Provider Note   CSN: [id number] Arrival date & time: 04/26/20  1629     History Chief Complaint  Patient presents with  . Abdominal Pain    Stacy [name] is a 39 y.o. female.  HPI   Patient is a 39 year old female with a history of intestinal pseudoobstruction who presents to the emergency department due to intractable nausea and vomiting.  Husband states that due to patient's medical history she has experienced these symptoms intermittently for quite some time.  Last weekend she ate a hamburger and afterwards became nauseated and started vomiting.  Patient has not been able to tolerate any p.o. intake for the past week.  Her temperature was slightly elevated between 99 and 99.5 Fahrenheit, so her PCP recommended that she be evaluated in urgent care.  She was tested for COVID-19 3 days ago and was negative.  She was given IV fluids at urgent care and then discharged back home.  Patient states that she has been taking Zofran with minimal relief.  When arriving at the emergency department today, she had an episode of near syncope and lightheadedness.  No syncope.  No falls.  No chest pain, shortness of breath, abdominal pain, constipation.  Patient reports some intermittent diarrhea no hematochezia.  Patient took Pepto-Bismol and this is resolved.     Past Medical History:  Diagnosis Date  . Anxiety   . Benign liver cyst   . Chronic intestinal pseudo-obstruction   . GERD (gastroesophageal reflux disease)   . History of UTI     Patient Active Problem List   Diagnosis Date Noted  . Physical exam 01/01/2020  . Abdominal pain with vomiting 04/25/2019  . GERD (gastroesophageal reflux disease) 04/07/2019  . Normocytic anemia 04/07/2019  . 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 Iron deficiency anemia 01/22/2018  . Vitamin B deficiency 01/22/2018  . Vitamin D deficiency 01/22/2018  . PCP NOTES >>>>>>>>>>>>>>>>>>>>>>>>>>>>>  06/28/2017  . Underweight 06/27/2017  . Cyst of ovary 10/05/2016  . Orthostatic hypotension 07/27/2013  . Hepatic adenoma 07/27/2013    Past Surgical History:  Procedure Laterality Date  . CESAREAN SECTION     twice 2010, 2013  . Esure  ~2015     OB History    Gravida  2   Para  2   Term  1   Preterm      AB      Living  2     SAB      TAB      Ectopic      Multiple      Live Births  1           Family History  Problem Relation Age of Onset  . CAD Father   . Hypertension Father   . Diabetes Father   . Diabetes Brother   . Colon cancer Neg Hx   . Breast cancer Neg Hx     Social History   Tobacco Use  . Smoking status: Never Smoker  . Smokeless tobacco: Never Used  Vaping Use  . Vaping Use: Never used  Substance Use Topics  . Alcohol use: No    Comment: socially   . Drug use: No    Home Medications Prior to Admission medications   Medication Sig Start Date End Date Taking? Authorizing Provider  calcium carbonate 200 MG capsule Take 250 mg by mouth 2 (two) times daily with a meal.    [provider]  cholecalciferol (VITAMIN  D3) 25 MCG (1000 UNIT) tablet TAKE 2 TABLETS (2,000 UNITS TOTAL) BY MOUTH DAILY. 12/31/19   [name], MD  famotidine (PEPCID) 20 MG tablet TAKE 1 TABLET BY MOUTH EVERYDAY AT BEDTIME 04/09/20   [name], MD  guaiFENesin-codeine [facility]) 100-10 MG/5ML syrup Take 5 mLs by mouth 3 (three) times daily as needed for cough. 01/18/20   [name], MD  montelukast (SINGULAIR) 10 MG tablet Take 10 mg by mouth daily. 01/08/20   [provider]  omeprazole (PRILOSEC) 40 MG capsule TAKE 1 CAPSULE BY MOUTH EVERY DAY 12/17/19   [name], MD  ondansetron (ZOFRAN ODT) 4 MG disintegrating tablet Take 1 tablet (4 mg total) by mouth every 8 (eight) hours as needed for nausea or vomiting. 10/26/19   [name], MD  POLY-IRON 150 150 MG capsule TAKE 1 CAPSULE BY MOUTH TWICE A  DAY 04/11/20   [name], MD  polyethylene glycol (MIRALAX / GLYCOLAX) 17 g packet Take 17 g by mouth 2 (two) times daily.    [provider]  Prucalopride Succinate (MOTEGRITY) 2 MG TABS Take 2 mg by mouth daily. 02/20/19   [provider]  sucralfate (CARAFATE) 1 g tablet Take 1 tablet (1 g total) by mouth 4 (four) times daily -  with meals and at bedtime. 11/08/19   [name], MD  triamcinolone ointment (KENALOG) 0.1 % APPLY 1 APPLICATION TOPICALLY AT BEDTIME. 11/08/19 11/07/20  [name], MD    Allergies    Tetracyclines & related, Erythromycin, Raspberry, and Sulfonamide derivatives  Review of Systems   Review of Systems  All other systems reviewed and are negative. Ten systems reviewed and are negative for acute change, except as noted in the HPI.   Physical Exam Updated Vital Signs BP (!) 92/53 (BP Location: Right Arm)   Pulse (!) 130   Temp 98.6 F (37 C) (Oral)   Resp 20   Wt 39.9 kg   SpO2 96%   BMI 15.59 kg/m   Physical Exam Vitals and nursing note reviewed.  Constitutional:      General: She is in acute distress.     Appearance: Normal appearance. She is not ill-appearing, toxic-appearing or diaphoretic.     Comments: Cachectic adult female.  Lying supine.  She appears nervous/tremulous on my exam.  She speaks clearly and coherently.  HENT:     Head: Normocephalic and atraumatic.     Right Ear: External ear normal.     Left Ear: External ear normal.     Nose: Nose normal.     Mouth/Throat:     Mouth: Mucous membranes are moist.     Pharynx: Oropharynx is clear. No oropharyngeal exudate or posterior oropharyngeal erythema.  Eyes:     Extraocular Movements: Extraocular movements intact.  Cardiovascular:     Rate and Rhythm: Regular rhythm. Tachycardia present.     Pulses: Normal pulses.     Heart sounds: Normal heart sounds. No murmur heard.  No friction rub. No gallop.   Pulmonary:     Effort: Pulmonary effort is  normal. No respiratory distress.     Breath sounds: Normal breath sounds. No stridor. No wheezing, rhonchi or rales.  Abdominal:     General: Abdomen is flat. There is no distension.     Palpations: Abdomen is soft.     Tenderness: There is no abdominal tenderness. There is no guarding or rebound. Negative signs include Murphy's sign and McBurney's sign.  Comments: Extremely cachectic female.  Abdomen is soft.  Nontender throughout.  Musculoskeletal:        General: Normal range of motion.     Cervical back: Normal range of motion and neck supple. No tenderness.  Skin:    General: Skin is warm and dry.  Neurological:     General: No focal deficit present.     Mental Status: She is alert and oriented to person, place, and time.  Psychiatric:        Mood and Affect: Mood normal.        Behavior: Behavior normal.    ED Results / Procedures / Treatments   Labs (all labs ordered are listed, but only abnormal results are displayed) Labs Reviewed  COMPREHENSIVE METABOLIC PANEL - Abnormal; Notable for the following components:      Result Value   Sodium 133 (*)    CO2 21 (*)    BUN 21 (*)    Calcium 7.9 (*)    Total Protein 5.6 (*)    Albumin 2.1 (*)    AST 13 (*)    All other components within normal limits  CBC - Abnormal; Notable for the following components:   RBC 3.60 (*)    Hemoglobin 10.1 (*)    HCT 30.5 (*)    Platelets 478 (*)    All other components within normal limits  URINALYSIS, ROUTINE W REFLEX MICROSCOPIC - Abnormal; Notable for the following components:   Color, Urine BROWN (*)    APPearance HAZY (*)    Hgb urine dipstick LARGE (*)    Bilirubin Urine SMALL (*)    Ketones, ur 15 (*)    All other components within normal limits  URINALYSIS, MICROSCOPIC (REFLEX) - Abnormal; Notable for the following components:   Bacteria, UA FEW (*)    All other components within normal limits  SARS CORONAVIRUS 2 BY RT PCR (HOSPITAL ORDER, [place]  [facility])  LIPASE, BLOOD  PREGNANCY, URINE   EKG EKG Interpretation  Date/Time:  Saturday April 26 2020 17:59:25 EDT Ventricular Rate:  116 PR Interval:    QRS Duration: 54 QT Interval:  347 QTC Calculation: 482 R Axis:   48 Text Interpretation: Sinus tachycardia Borderline T abnormalities, diffuse leads Since last tracing rate faster Confirmed by Wandra Arthurs [phone]) on 04/26/2020 8:20:42 PM   Radiology CT ABDOMEN PELVIS W CONTRAST  Result Date: 04/26/2020 CLINICAL DATA:  Abdominal pain.  Bowel obstruction suspected EXAM: CT ABDOMEN AND PELVIS WITH CONTRAST TECHNIQUE: Multidetector CT imaging of the abdomen and pelvis was performed using the standard protocol following bolus administration of intravenous contrast. CONTRAST:  44m OMNIPAQUE IOHEXOL 300 MG/ML  SOLN COMPARISON:  04/25/2019 FINDINGS: Lower chest: There are trace bilateral pleural effusions. The heart size is unremarkable. There is atelectasis at the left lung base. Hepatobiliary: Again noted is intrahepatic and extrahepatic biliary ductal dilatation which is similar in appearance to prior study. Multiple liver masses are noted, specifically within the right hepatic lobe and the caudate lobe. The gallbladder is contracted and therefore poorly evaluated.There is no biliary ductal dilation. Pancreas: Normal contours without ductal dilatation. No peripancreatic fluid collection. Spleen: Unremarkable. Adrenals/Urinary Tract: --Adrenal glands: Unremarkable. --Right kidney/ureter: No hydronephrosis or radiopaque kidney stones. --Left kidney/ureter: No hydronephrosis or radiopaque kidney stones. --Urinary bladder: Unremarkable. Stomach/Bowel: --Stomach/Duodenum: There is an air-fluid level in the stomach. --Small bowel: The there is diffuse mild dilatation of virtually all of the small bowel loops. Many of the small bowel loops  contain air-fluid levels there is relative decompression of the terminal ileum. --Colon: There is mild diffuse wall  thickening of the colon with apparent loss of haustral. --Appendix: Normal. Vascular/Lymphatic: Normal course and caliber of the major abdominal vessels. --No retroperitoneal lymphadenopathy. --there are mildly enlarged mesenteric lymph nodes, especially in the right lower quadrant. --No pelvic or inguinal lymphadenopathy. Reproductive: Bilateral Essure devices are noted. Other: No ascites or free air. The abdominal wall is normal. Musculoskeletal. No acute displaced fractures. IMPRESSION: 1. Diffuse mild dilatation of virtually all of the small bowel with associated air-fluid levels. This appearance is very similar to the patient's CT dated 04/25/2019. There is no clear transition point. Additionally, there is mild diffuse wall thickening of the colon. Findings may be secondary to infectious or inflammatory enterocolitis. 2. Essentially unchanged intrahepatic biliary ductal dilatation. Correlation with laboratory studies is recommended. 3. Trace bilateral pleural effusions. Electronically Signed   By: Constance Holster M.D.   On: 04/26/2020 20:49   DG Abdomen Acute W/Chest  Result Date: 04/26/2020 CLINICAL DATA:  One-week history of nausea, diarrhea and anorexia. Negative COVID-19 tests recently. EXAM: ACUTE ABDOMEN SERIES (ABDOMEN TWO VIEWS AND CHEST ONE VIEW) COMPARISON:  Acute abdomen series 04/22/2019 and CT abdomen and pelvis 04/25/2019. FINDINGS: Gaseous distention of numerous loops of small bowel throughout the abdomen and pelvis demonstrating air-fluid levels on the ERECT image. Gas and liquid stool in normal caliber colon. No evidence of free intraperitoneal air on the [employer]. Essure devices in the fallopian tubes in the pelvis. No visible opaque urinary tract calculi. Regional skeleton unremarkable. Cardiomediastinal silhouette unremarkable and unchanged. Lungs clear. Bronchovascular markings normal. Pulmonary vascularity normal. No visible pleural effusions. No pneumothorax. IMPRESSION: 1.  Partial small bowel obstruction is favored over small bowel ileus. 2. No free intraperitoneal air. 3. No acute cardiopulmonary disease. Electronically Signed   By: Evangeline Dakin M.D.   On: 04/26/2020 20:11    Procedures Procedures (including critical care time)  Medications Ordered in ED Medications  ondansetron (ZOFRAN) injection 4 mg (4 mg Intravenous Given 04/26/20 1701)  LORazepam (ATIVAN) injection 0.5 mg (0.5 mg Intravenous Given 04/26/20 1752)  lactated ringers bolus 1,000 mL (0 mLs Intravenous Stopped 04/26/20 2111)  iohexol (OMNIPAQUE) 300 MG/ML solution 75 mL (75 mLs Intravenous Contrast Given 04/26/20 2034)   ED Course  I have reviewed the triage vital signs and the nursing notes.  Pertinent labs & imaging results that were available during my care of the patient were reviewed by me and considered in my medical decision making (see chart for details).  Clinical Course as of Apr 26 2245  Sat Apr 26, 2020  1750 Albumin(!): 2.1 [LJ]  1750 Total Protein(!): 5.6 [LJ]  1750 Calcium(!): 7.9 [LJ]  1750 Similar to prior values  Hemoglobin(!): 10.1 [LJ]  1902 Patient reassessed and she is feeling slightly better.  Mild nausea.  Patient states she would like to try eating.  Will give an additional liter of fluids.  Will p.o. challenge.  Will monitor and reassess.   [LJ]  1959 Preg Test, Ur: NEGATIVE [LJ]  1959 Hemoglobin noted consistently on prior UAs  Hgb urine dipstick(!): LARGE [LJ]  1959 Bilirubin Urine(!): SMALL [LJ]  1959 Ketones, ur(!): 15 [LJ]  2020 1. Partial small bowel obstruction is favored over small bowel ileus. 2. No free intraperitoneal air. 3. No acute cardiopulmonary disease.  DG Abdomen Acute W/Chest [LJ]    Clinical Course User Index [LJ] Rayna Sexton, PA-C   MDM Rules/Calculators/A&P  Pt is a 39 y.o. female that presents with a history, physical exam, and ED Clinical Course as noted above.   Patient presents today with  intractable nausea and vomiting started about 1 week ago.  Patient has an extensive GI history and was diagnosed with pseudoobstruction at [facility].  She is followed by Dr. Michail Sermon here in [place].  Patient reports a history of similar symptoms in the past.  She feels that her symptoms this week started after she ate a hamburger over the weekend.  Since then, she has been able to tolerate very little p.o. intake.  On arrival she was tachycardic in the 130s and mildly hypotensive in the low 01U systolic.  Per her husband, her blood pressure is typically in the mid 40E systolic and the mid 59P diastolic.  Patient given 2 L of IV fluids and her heart rate has improved to about 110.  She was given a dose of Zofran and states that she is no longer nauseated.  Patient able to tolerate p.o. intake at this time.  LFTs, alk phos, are reassuring.  No leukocytosis.  No elevation in lipase.  Hemoglobin consistent with prior values.  Decreased total protein and albumin.   I initially obtained a abdominal series with findings as noted above and followed this up with a CT scan.  Please refer to the findings above.  Discussed this with both my attending physician as well as patient and her husband who is at bedside.  Feel that patient is safe for discharge at this time but also that she needs follow-up with her gastroenterologist as soon as possible.   Both the patient and her husband were appreciative.  They are planning on calling Dr. Michail Sermon first thing on Monday.  Patient states she is about out of Zofran, so I will send a new prescription to her pharmacy.  Their questions were answered and they were amicable at the time of discharge.   An After Visit Summary was printed and given to the patient.  Patient discharged to home/self care.  Condition at discharge: Stable  Note: Portions of this report may have been transcribed using voice recognition software. Every effort was made to ensure accuracy; however,  inadvertent computerized transcription errors may be present.   Final Clinical Impression(s) / ED Diagnoses Final diagnoses:  Non-intractable vomiting with nausea, unspecified vomiting type   Rx / DC Orders ED Discharge Orders         Ordered    ondansetron (ZOFRAN) 4 MG tablet  Every 8 hours PRN        04/26/20 2213           Rayna Sexton, PA-C 04/26/20 2247    Drenda Freeze, MD 04/26/20 [phone]

## 2020-04-26 NOTE — ED Notes (Signed)
ED Provider at bedside. 

## 2020-04-26 NOTE — Discharge Instructions (Signed)
Please call Dr. Michail Sermon first thing on Monday morning.  I have sent a new prescription for Zofran to your pharmacy.  Please return to the ER if you develop new or worsening symptoms once again.  It was a pleasure to meet you both.

## 2020-04-26 NOTE — ED Notes (Signed)
Patient transported to CT 

## 2020-04-26 NOTE — ED Notes (Signed)
Pt aware urine specimen ordered. Pt reports inability to provide specimen at this time. Specimen collection device provided to patient. 

## 2020-04-26 NOTE — ED Triage Notes (Signed)
Abd pain, N/V/D x 1 week. Pt seen at [facility] and was given IVF. Pt had negative COVID test.  Symptoms have continued. Pt reports near-syncope today.

## 2020-04-27 ENCOUNTER — Other Ambulatory Visit: Payer: Self-pay

## 2020-04-27 ENCOUNTER — Inpatient Hospital Stay (HOSPITAL_BASED_OUTPATIENT_CLINIC_OR_DEPARTMENT_OTHER)
Admission: EM | Admit: 2020-04-27 | Discharge: 2020-05-01 | DRG: 641 | Disposition: A | Discharge status: Home / Self Care | Payer: 59 | Attending: Internal Medicine | Admitting: Internal Medicine

## 2020-04-27 ENCOUNTER — Encounter (HOSPITAL_BASED_OUTPATIENT_CLINIC_OR_DEPARTMENT_OTHER): Payer: Self-pay | Admitting: Emergency Medicine

## 2020-04-27 DIAGNOSIS — K56600 Partial intestinal obstruction, unspecified as to cause: Secondary | ICD-10-CM | POA: Diagnosis present

## 2020-04-27 DIAGNOSIS — K219 Gastro-esophageal reflux disease without esophagitis: Secondary | ICD-10-CM | POA: Diagnosis present

## 2020-04-27 DIAGNOSIS — E8809 Other disorders of plasma-protein metabolism, not elsewhere classified: Secondary | ICD-10-CM | POA: Diagnosis present

## 2020-04-27 DIAGNOSIS — Z881 Allergy status to other antibiotic agents status: Secondary | ICD-10-CM

## 2020-04-27 DIAGNOSIS — F419 Anxiety disorder, unspecified: Secondary | ICD-10-CM | POA: Diagnosis present

## 2020-04-27 DIAGNOSIS — K567 Ileus, unspecified: Secondary | ICD-10-CM | POA: Diagnosis present

## 2020-04-27 DIAGNOSIS — Z91018 Allergy to other foods: Secondary | ICD-10-CM

## 2020-04-27 DIAGNOSIS — Z681 Body mass index (BMI) 19 or less, adult: Secondary | ICD-10-CM

## 2020-04-27 DIAGNOSIS — I959 Hypotension, unspecified: Secondary | ICD-10-CM | POA: Diagnosis present

## 2020-04-27 DIAGNOSIS — K529 Noninfective gastroenteritis and colitis, unspecified: Secondary | ICD-10-CM | POA: Diagnosis present

## 2020-04-27 DIAGNOSIS — Z20822 Contact with and (suspected) exposure to covid-19: Secondary | ICD-10-CM | POA: Diagnosis present

## 2020-04-27 DIAGNOSIS — D649 Anemia, unspecified: Secondary | ICD-10-CM | POA: Diagnosis present

## 2020-04-27 DIAGNOSIS — R112 Nausea with vomiting, unspecified: Secondary | ICD-10-CM

## 2020-04-27 DIAGNOSIS — N390 Urinary tract infection, site not specified: Secondary | ICD-10-CM | POA: Diagnosis present

## 2020-04-27 DIAGNOSIS — Z833 Family history of diabetes mellitus: Secondary | ICD-10-CM

## 2020-04-27 DIAGNOSIS — R627 Adult failure to thrive: Principal | ICD-10-CM | POA: Diagnosis present

## 2020-04-27 DIAGNOSIS — K5989 Other specified functional intestinal disorders: Secondary | ICD-10-CM | POA: Diagnosis present

## 2020-04-27 DIAGNOSIS — R636 Underweight: Secondary | ICD-10-CM | POA: Diagnosis present

## 2020-04-27 DIAGNOSIS — E871 Hypo-osmolality and hyponatremia: Secondary | ICD-10-CM | POA: Diagnosis present

## 2020-04-27 DIAGNOSIS — Z87440 Personal history of urinary (tract) infections: Secondary | ICD-10-CM

## 2020-04-27 DIAGNOSIS — Z8249 Family history of ischemic heart disease and other diseases of the circulatory system: Secondary | ICD-10-CM

## 2020-04-27 DIAGNOSIS — Z888 Allergy status to other drugs, medicaments and biological substances status: Secondary | ICD-10-CM

## 2020-04-27 DIAGNOSIS — R55 Syncope and collapse: Secondary | ICD-10-CM | POA: Diagnosis present

## 2020-04-27 DIAGNOSIS — E860 Dehydration: Secondary | ICD-10-CM | POA: Diagnosis present

## 2020-04-27 DIAGNOSIS — E876 Hypokalemia: Secondary | ICD-10-CM | POA: Diagnosis present

## 2020-04-27 DIAGNOSIS — Z79899 Other long term (current) drug therapy: Secondary | ICD-10-CM

## 2020-04-27 DIAGNOSIS — Z882 Allergy status to sulfonamides status: Secondary | ICD-10-CM

## 2020-04-27 LAB — COMPREHENSIVE METABOLIC PANEL
ALT: 12 U/L (ref 0–44)
AST: 10 U/L — ABNORMAL LOW (ref 15–41)
Albumin: 1.6 g/dL — ABNORMAL LOW (ref 3.5–5.0)
Alkaline Phosphatase: 77 U/L (ref 38–126)
Anion gap: 9 (ref 5–15)
BUN: 19 mg/dL (ref 6–20)
CO2: 22 mmol/L (ref 22–32)
Calcium: 7.3 mg/dL — ABNORMAL LOW (ref 8.9–10.3)
Chloride: 101 mmol/L (ref 98–111)
Creatinine, Ser: 0.39 mg/dL — ABNORMAL LOW (ref 0.44–1.00)
GFR calc Af Amer: >60 mL/min (ref >60)
GFR calc non Af Amer: >60 mL/min (ref >60)
Glucose, Bld: 91 mg/dL (ref 70–99)
Potassium: 3.6 mmol/L (ref 3.5–5.1)
Sodium: 132 mmol/L — ABNORMAL LOW (ref 135–145)
Total Bilirubin: 1.6 mg/dL — ABNORMAL HIGH (ref 0.3–1.2)
Total Protein: 4.3 g/dL — ABNORMAL LOW (ref 6.5–8.1)

## 2020-04-27 LAB — CBC WITH DIFFERENTIAL/PLATELET
Abs Immature Granulocytes: 0.16 10*3/uL — ABNORMAL HIGH (ref 0.00–0.07)
Basophils Absolute: 0 10*3/uL (ref 0.0–0.1)
Basophils Relative: 0 %
Eosinophils Absolute: 0.1 10*3/uL (ref 0.0–0.5)
Eosinophils Relative: 1 %
HCT: 24.5 % — ABNORMAL LOW (ref 36.0–46.0)
Hemoglobin: 8.2 g/dL — ABNORMAL LOW (ref 12.0–15.0)
Immature Granulocytes: 2 %
Lymphocytes Relative: 20 %
Lymphs Abs: 1.8 10*3/uL (ref 0.7–4.0)
MCH: 28.3 pg (ref 26.0–34.0)
MCHC: 33.5 g/dL (ref 30.0–36.0)
MCV: 84.5 fL (ref 80.0–100.0)
Monocytes Absolute: 0.9 10*3/uL (ref 0.1–1.0)
Monocytes Relative: 10 %
Neutro Abs: 6 10*3/uL (ref 1.7–7.7)
Neutrophils Relative %: 67 %
Platelets: 375 10*3/uL (ref 150–400)
RBC: 2.9 MIL/uL — ABNORMAL LOW (ref 3.87–5.11)
RDW: 12.7 % (ref 11.5–15.5)
Smear Review: NORMAL
WBC: 9 10*3/uL (ref 4.0–10.5)
nRBC: 0 % (ref 0.0–0.2)

## 2020-04-27 LAB — SARS CORONAVIRUS 2 BY RT PCR (HOSPITAL ORDER, PERFORMED IN ~~LOC~~ HOSPITAL LAB): SARS Coronavirus 2: NEGATIVE

## 2020-04-27 MED ORDER — PROMETHAZINE HCL 25 MG/ML IJ SOLN
12.5000 mg | Freq: Once | INTRAMUSCULAR | Status: AC
  Administered 2020-04-27: 12.5 mg via INTRAVENOUS
  Filled 2020-04-27: qty 1

## 2020-04-27 MED ORDER — PROMETHAZINE HCL 25 MG/ML IJ SOLN: 12.5000 mg | Freq: Three times a day (TID) | INTRAMUSCULAR | Status: DC | PRN

## 2020-04-27 MED ORDER — LACTATED RINGERS IV BOLUS
1000.0000 mL | Freq: Once | INTRAVENOUS | Status: AC
  Administered 2020-04-27: 1000 mL via INTRAVENOUS

## 2020-04-27 MED ORDER — SODIUM CHLORIDE 0.9 % IV SOLN: Freq: Once | INTRAVENOUS | Status: AC

## 2020-04-27 MED ORDER — ONDANSETRON HCL 4 MG/2ML IJ SOLN
4.0000 mg | Freq: Three times a day (TID) | INTRAMUSCULAR | Status: DC | PRN
  Administered 2020-04-28 (×3): 4 mg via INTRAVENOUS
  Filled 2020-04-27 (×3): qty 2

## 2020-04-27 MED ORDER — SODIUM CHLORIDE 0.9 % IV BOLUS
1000.0000 mL | Freq: Once | INTRAVENOUS | Status: AC
  Administered 2020-04-27: 1000 mL via INTRAVENOUS

## 2020-04-27 NOTE — ED Provider Notes (Signed)
[facility] Provider Note   CSN: [id number] Arrival date & time: 04/27/20  1908     History Chief Complaint  Patient presents with  . Emesis    Stacy [name] is a 39 y.o. female.  HPI   Patient is a 39 year old female with a history of intestinal pseudoobstruction who presents to the emergency department due to intractable nausea and vomiting.  Patient was seen in the emergency department yesterday for the same symptoms.  Patient states that upon waking this morning she had a small amount of broth and later had a small amount of tortilla as well as chicken.  Later in the day she began experiencing recurrent nausea and vomiting.  No hematemesis.  Patient states she is feeling extremely fatigued and lightheaded.  No chest pain or shortness of breath.  No abdominal pain.  No diarrhea.     Past Medical History:  Diagnosis Date  . Anxiety   . Benign liver cyst   . Chronic intestinal pseudo-obstruction   . GERD (gastroesophageal reflux disease)   . History of UTI     Patient Active Problem List   Diagnosis Date Noted  . Physical exam 01/01/2020  . Abdominal pain with vomiting 04/25/2019  . GERD (gastroesophageal reflux disease) 04/07/2019  . Normocytic anemia 04/07/2019  . 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 Iron deficiency anemia 01/22/2018  . Vitamin B deficiency 01/22/2018  . Vitamin D deficiency 01/22/2018  . PCP NOTES >>>>>>>>>>>>>>>>>>>>>>>>>>>>> 06/28/2017  . Underweight 06/27/2017  . Cyst of ovary 10/05/2016  . Orthostatic hypotension 07/27/2013  . Hepatic adenoma 07/27/2013    Past Surgical History:  Procedure Laterality Date  . CESAREAN SECTION     twice 2010, 2013  . Esure  ~2015     OB History    Gravida  2   Para  2   Term  1   Preterm      AB      Living  2     SAB      TAB      Ectopic      Multiple      Live Births  1           Family History  Problem Relation Age of Onset    . CAD Father   . Hypertension Father   . Diabetes Father   . Diabetes Brother   . Colon cancer Neg Hx   . Breast cancer Neg Hx     Social History   Tobacco Use  . Smoking status: Never Smoker  . Smokeless tobacco: Never Used  Vaping Use  . Vaping Use: Never used  Substance Use Topics  . Alcohol use: No    Comment: socially   . Drug use: No    Home Medications Prior to Admission medications   Medication Sig Start Date End Date Taking? Authorizing Provider  calcium carbonate 200 MG capsule Take 250 mg by mouth 2 (two) times daily with a meal.    [provider]  cholecalciferol (VITAMIN D3) 25 MCG (1000 UNIT) tablet TAKE 2 TABLETS (2,000 UNITS TOTAL) BY MOUTH DAILY. 12/31/19   [name], MD  famotidine (PEPCID) 20 MG tablet TAKE 1 TABLET BY MOUTH EVERYDAY AT BEDTIME 04/09/20   [name], MD  guaiFENesin-codeine [facility]) 100-10 MG/5ML syrup Take 5 mLs by mouth 3 (three) times daily as needed for cough. 01/18/20   [name], MD  montelukast (SINGULAIR) 10 MG tablet Take  10 mg by mouth daily. 01/08/20   [provider]  omeprazole (PRILOSEC) 40 MG capsule TAKE 1 CAPSULE BY MOUTH EVERY DAY 12/17/19   [name], MD  ondansetron (ZOFRAN ODT) 4 MG disintegrating tablet Take 1 tablet (4 mg total) by mouth every 8 (eight) hours as needed for nausea or vomiting. 10/26/19   [name], MD  ondansetron (ZOFRAN) 4 MG tablet Take 1 tablet (4 mg total) by mouth every 8 (eight) hours as needed for nausea or vomiting. 04/26/20   Rayna Sexton, PA-C  POLY-IRON 150 150 MG capsule TAKE 1 CAPSULE BY MOUTH TWICE A DAY 04/11/20   [name], MD  polyethylene glycol (MIRALAX / GLYCOLAX) 17 g packet Take 17 g by mouth 2 (two) times daily.    [provider]  Prucalopride Succinate (MOTEGRITY) 2 MG TABS Take 2 mg by mouth daily. 02/20/19   [provider]  sucralfate (CARAFATE) 1 g tablet Take 1 tablet (1 g total) by  mouth 4 (four) times daily -  with meals and at bedtime. 11/08/19   [name], MD  triamcinolone ointment (KENALOG) 0.1 % APPLY 1 APPLICATION TOPICALLY AT BEDTIME. 11/08/19 11/07/20  [name], MD    Allergies    Tetracyclines & related, Erythromycin, Raspberry, and Sulfonamide derivatives  Review of Systems   Review of Systems  All other systems reviewed and are negative. Ten systems reviewed and are negative for acute change, except as noted in the HPI.   Physical Exam Updated Vital Signs BP (!) 96/58 (BP Location: Right Arm)   Pulse (!) 130   Temp 99.4 F (37.4 C) (Oral)   Resp 18   Ht 5\' 3"  (1.6 m)   Wt 38.6 kg   SpO2 98%   BMI 15.06 kg/m   Physical Exam Vitals and nursing note reviewed.  Constitutional:      General: She is in acute distress.     Appearance: Normal appearance. She is ill-appearing. She is not toxic-appearing or diaphoretic.     Comments: Well-developed but extremely cachectic female.  Patient speaks in clear and coherent sentences.  HENT:     Head: Normocephalic and atraumatic.     Right Ear: External ear normal.     Left Ear: External ear normal.     Nose: Nose normal.     Mouth/Throat:     Mouth: Mucous membranes are moist.     Pharynx: Oropharynx is clear. No oropharyngeal exudate or posterior oropharyngeal erythema.  Eyes:     General: No scleral icterus.       Right eye: No discharge.        Left eye: No discharge.     Extraocular Movements: Extraocular movements intact.     Conjunctiva/sclera: Conjunctivae normal.  Cardiovascular:     Rate and Rhythm: Regular rhythm. Tachycardia present.     Pulses: Normal pulses.     Heart sounds: Normal heart sounds. No murmur heard.  No friction rub. No gallop.   Pulmonary:     Effort: Pulmonary effort is normal. No respiratory distress.     Breath sounds: Normal breath sounds. No stridor. No wheezing, rhonchi or rales.  Abdominal:     General: Abdomen is flat. Bowel sounds are  normal.     Palpations: Abdomen is soft.     Tenderness: There is no abdominal tenderness.  Musculoskeletal:        General: Normal range of motion.     Cervical back: Normal range  of motion and neck supple. No tenderness.  Skin:    General: Skin is warm and dry.  Neurological:     General: No focal deficit present.     Mental Status: She is alert and oriented to person, place, and time.  Psychiatric:        Mood and Affect: Mood normal.        Behavior: Behavior normal.    ED Results / Procedures / Treatments   Labs (all labs ordered are listed, but only abnormal results are displayed) Labs Reviewed  COMPREHENSIVE METABOLIC PANEL - Abnormal; Notable for the following components:      Result Value   Sodium 132 (*)    Creatinine, Ser 0.39 (*)    Calcium 7.3 (*)    Total Protein 4.3 (*)    Albumin 1.6 (*)    AST 10 (*)    Total Bilirubin 1.6 (*)    All other components within normal limits  CBC WITH DIFFERENTIAL/PLATELET - Abnormal; Notable for the following components:   RBC 2.90 (*)    Hemoglobin 8.2 (*)    HCT 24.5 (*)    Abs Immature Granulocytes 0.16 (*)    All other components within normal limits  SARS CORONAVIRUS 2 BY RT PCR (HOSPITAL ORDER, [facility])  URINALYSIS, ROUTINE W REFLEX MICROSCOPIC   EKG None  Radiology CT ABDOMEN PELVIS W CONTRAST  Result Date: 04/26/2020 CLINICAL DATA:  Abdominal pain.  Bowel obstruction suspected EXAM: CT ABDOMEN AND PELVIS WITH CONTRAST TECHNIQUE: Multidetector CT imaging of the abdomen and pelvis was performed using the standard protocol following bolus administration of intravenous contrast. CONTRAST:  51mL OMNIPAQUE IOHEXOL 300 MG/ML  SOLN COMPARISON:  04/25/2019 FINDINGS: Lower chest: There are trace bilateral pleural effusions. The heart size is unremarkable. There is atelectasis at the left lung base. Hepatobiliary: Again noted is intrahepatic and extrahepatic biliary ductal dilatation which is  similar in appearance to prior study. Multiple liver masses are noted, specifically within the right hepatic lobe and the caudate lobe. The gallbladder is contracted and therefore poorly evaluated.There is no biliary ductal dilation. Pancreas: Normal contours without ductal dilatation. No peripancreatic fluid collection. Spleen: Unremarkable. Adrenals/Urinary Tract: --Adrenal glands: Unremarkable. --Right kidney/ureter: No hydronephrosis or radiopaque kidney stones. --Left kidney/ureter: No hydronephrosis or radiopaque kidney stones. --Urinary bladder: Unremarkable. Stomach/Bowel: --Stomach/Duodenum: There is an air-fluid level in the stomach. --Small bowel: The there is diffuse mild dilatation of virtually all of the small bowel loops. Many of the small bowel loops contain air-fluid levels there is relative decompression of the terminal ileum. --Colon: There is mild diffuse wall thickening of the colon with apparent loss of haustral. --Appendix: Normal. Vascular/Lymphatic: Normal course and caliber of the major abdominal vessels. --No retroperitoneal lymphadenopathy. --there are mildly enlarged mesenteric lymph nodes, especially in the right lower quadrant. --No pelvic or inguinal lymphadenopathy. Reproductive: Bilateral Essure devices are noted. Other: No ascites or free air. The abdominal wall is normal. Musculoskeletal. No acute displaced fractures. IMPRESSION: 1. Diffuse mild dilatation of virtually all of the small bowel with associated air-fluid levels. This appearance is very similar to the patient's CT dated 04/25/2019. There is no clear transition point. Additionally, there is mild diffuse wall thickening of the colon. Findings may be secondary to infectious or inflammatory enterocolitis. 2. Essentially unchanged intrahepatic biliary ductal dilatation. Correlation with laboratory studies is recommended. 3. Trace bilateral pleural effusions. Electronically Signed   By: Constance Holster M.D.   On:  04/26/2020 20:49  DG Abdomen Acute W/Chest  Result Date: 04/26/2020 CLINICAL DATA:  One-week history of nausea, diarrhea and anorexia. Negative COVID-19 tests recently. EXAM: ACUTE ABDOMEN SERIES (ABDOMEN TWO VIEWS AND CHEST ONE VIEW) COMPARISON:  Acute abdomen series 04/22/2019 and CT abdomen and pelvis 04/25/2019. FINDINGS: Gaseous distention of numerous loops of small bowel throughout the abdomen and pelvis demonstrating air-fluid levels on the ERECT image. Gas and liquid stool in normal caliber colon. No evidence of free intraperitoneal air on the [employer]. Essure devices in the fallopian tubes in the pelvis. No visible opaque urinary tract calculi. Regional skeleton unremarkable. Cardiomediastinal silhouette unremarkable and unchanged. Lungs clear. Bronchovascular markings normal. Pulmonary vascularity normal. No visible pleural effusions. No pneumothorax. IMPRESSION: 1. Partial small bowel obstruction is favored over small bowel ileus. 2. No free intraperitoneal air. 3. No acute cardiopulmonary disease. Electronically Signed   By: Evangeline Dakin M.D.   On: 04/26/2020 20:11   Procedures Procedures (including critical care time)  Medications Ordered in ED Medications  ondansetron (ZOFRAN) injection 4 mg (has no administration in time range)  0.9 %  sodium chloride infusion (has no administration in time range)  sodium chloride 0.9 % bolus 1,000 mL ( Intravenous Stopped 04/27/20 2219)  promethazine (PHENERGAN) injection 12.5 mg (12.5 mg Intravenous Given 04/27/20 2115)  lactated ringers bolus 1,000 mL (1,000 mLs Intravenous New Bag/Given 04/27/20 2222)   ED Course  I have reviewed the triage vital signs and the nursing notes.  Pertinent labs & imaging results that were available during my care of the patient were reviewed by me and considered in my medical decision making (see chart for details).    MDM Rules/Calculators/A&P                          Pt is a 39 y.o. female that  presents with a history, physical exam, and ED Clinical Course as noted above.   Patient presents today with intractable nausea and vomiting.  Patient has a history of intestinal pseudoobstruction and was seen with similar symptoms yesterday.  Plan to discharge yesterday was for patient to take p.o. Zofran as needed and follow-up with her gastroenterologist, Dr. Michail Sermon, on Monday but patient states her symptoms began once again today and has been unable to tolerate any p.o. intake.  I obtained a CT image yesterday at her visit as well as abdominal x-rays with findings as noted above.  She is once again tachycardic as well as hypotensive.  Patient is extremely cachectic and malnourished.  She is followed by Dr. Michail Sermon with gastroenterology.  Patient given Phenergan as well as 2 L of IVF.  Will place orders for as needed Zofran.  Will start patient on maintenance fluids.  We will discuss with the hospitalist team for likely admission secondary to intractable nausea and vomiting as well as failure to thrive.  COVID-19 test ordered.  Note: Portions of this report may have been transcribed using voice recognition software. Every effort was made to ensure accuracy; however, inadvertent computerized transcription errors may be present.    Final Clinical Impression(s) / ED Diagnoses Final diagnoses:  Intractable nausea and vomiting  Failure to thrive in adult    Rx / DC Orders ED Discharge Orders    None       Rayna Sexton, PA-C 04/27/20 2329    Drenda Freeze, MD 04/28/20 1455

## 2020-04-27 NOTE — ED Triage Notes (Signed)
Seen yesterday for the same.  C/o abdominal pain with n/v/d.  Was given IVF and discharged.  This has been going on for 7 days.  Was negative for covid yesterday.

## 2020-04-28 ENCOUNTER — Encounter (HOSPITAL_COMMUNITY): Payer: Self-pay | Admitting: Internal Medicine

## 2020-04-28 ENCOUNTER — Other Ambulatory Visit: Payer: Self-pay

## 2020-04-28 DIAGNOSIS — Z8249 Family history of ischemic heart disease and other diseases of the circulatory system: Secondary | ICD-10-CM | POA: Diagnosis not present

## 2020-04-28 DIAGNOSIS — N390 Urinary tract infection, site not specified: Secondary | ICD-10-CM | POA: Diagnosis present

## 2020-04-28 DIAGNOSIS — Z881 Allergy status to other antibiotic agents status: Secondary | ICD-10-CM | POA: Diagnosis not present

## 2020-04-28 DIAGNOSIS — Z681 Body mass index (BMI) 19 or less, adult: Secondary | ICD-10-CM | POA: Diagnosis not present

## 2020-04-28 DIAGNOSIS — R627 Adult failure to thrive: Secondary | ICD-10-CM | POA: Diagnosis present

## 2020-04-28 DIAGNOSIS — K56600 Partial intestinal obstruction, unspecified as to cause: Secondary | ICD-10-CM | POA: Diagnosis present

## 2020-04-28 DIAGNOSIS — D649 Anemia, unspecified: Secondary | ICD-10-CM | POA: Diagnosis present

## 2020-04-28 DIAGNOSIS — R112 Nausea with vomiting, unspecified: Secondary | ICD-10-CM

## 2020-04-28 DIAGNOSIS — E8809 Other disorders of plasma-protein metabolism, not elsewhere classified: Secondary | ICD-10-CM | POA: Diagnosis present

## 2020-04-28 DIAGNOSIS — Z79899 Other long term (current) drug therapy: Secondary | ICD-10-CM | POA: Diagnosis not present

## 2020-04-28 DIAGNOSIS — Z20822 Contact with and (suspected) exposure to covid-19: Secondary | ICD-10-CM | POA: Diagnosis present

## 2020-04-28 DIAGNOSIS — Z91018 Allergy to other foods: Secondary | ICD-10-CM | POA: Diagnosis not present

## 2020-04-28 DIAGNOSIS — R109 Unspecified abdominal pain: Secondary | ICD-10-CM | POA: Diagnosis present

## 2020-04-28 DIAGNOSIS — Z87440 Personal history of urinary (tract) infections: Secondary | ICD-10-CM | POA: Diagnosis not present

## 2020-04-28 DIAGNOSIS — K219 Gastro-esophageal reflux disease without esophagitis: Secondary | ICD-10-CM | POA: Diagnosis present

## 2020-04-28 DIAGNOSIS — Z882 Allergy status to sulfonamides status: Secondary | ICD-10-CM | POA: Diagnosis not present

## 2020-04-28 DIAGNOSIS — I959 Hypotension, unspecified: Secondary | ICD-10-CM | POA: Diagnosis present

## 2020-04-28 DIAGNOSIS — E871 Hypo-osmolality and hyponatremia: Secondary | ICD-10-CM | POA: Diagnosis present

## 2020-04-28 DIAGNOSIS — K529 Noninfective gastroenteritis and colitis, unspecified: Secondary | ICD-10-CM | POA: Diagnosis present

## 2020-04-28 DIAGNOSIS — F419 Anxiety disorder, unspecified: Secondary | ICD-10-CM | POA: Diagnosis present

## 2020-04-28 DIAGNOSIS — K5989 Other specified functional intestinal disorders: Secondary | ICD-10-CM | POA: Diagnosis not present

## 2020-04-28 DIAGNOSIS — E860 Dehydration: Secondary | ICD-10-CM | POA: Diagnosis present

## 2020-04-28 DIAGNOSIS — K567 Ileus, unspecified: Secondary | ICD-10-CM | POA: Diagnosis present

## 2020-04-28 DIAGNOSIS — Z833 Family history of diabetes mellitus: Secondary | ICD-10-CM | POA: Diagnosis not present

## 2020-04-28 DIAGNOSIS — Z888 Allergy status to other drugs, medicaments and biological substances status: Secondary | ICD-10-CM | POA: Diagnosis not present

## 2020-04-28 DIAGNOSIS — E876 Hypokalemia: Secondary | ICD-10-CM | POA: Diagnosis present

## 2020-04-28 HISTORY — DX: Nausea with vomiting, unspecified: R11.2

## 2020-04-28 LAB — VITAMIN B12: Vitamin B-12: 2634 pg/mL — ABNORMAL HIGH (ref 180–914)

## 2020-04-28 LAB — URINALYSIS, ROUTINE W REFLEX MICROSCOPIC
Bilirubin Urine: NEGATIVE
Glucose, UA: NEGATIVE mg/dL
Ketones, ur: 15 mg/dL — AB
Nitrite: NEGATIVE
Protein, ur: NEGATIVE mg/dL
Specific Gravity, Urine: 1.02 (ref 1.005–1.030)
pH: 6 (ref 5.0–8.0)

## 2020-04-28 LAB — URINALYSIS, MICROSCOPIC (REFLEX)

## 2020-04-28 LAB — HEPATIC FUNCTION PANEL
ALT: 12 U/L (ref 0–44)
AST: 10 U/L — ABNORMAL LOW (ref 15–41)
Albumin: 1.4 g/dL — ABNORMAL LOW (ref 3.5–5.0)
Alkaline Phosphatase: 69 U/L (ref 38–126)
Bilirubin, Direct: 0.4 mg/dL — ABNORMAL HIGH (ref 0.0–0.2)
Indirect Bilirubin: 0.8 mg/dL (ref 0.3–0.9)
Total Bilirubin: 1.2 mg/dL (ref 0.3–1.2)
Total Protein: 3.9 g/dL — ABNORMAL LOW (ref 6.5–8.1)

## 2020-04-28 LAB — C-REACTIVE PROTEIN: CRP: 11.8 mg/dL — ABNORMAL HIGH (ref <1.0)

## 2020-04-28 LAB — IRON AND TIBC: Iron: 69 ug/dL (ref 28–170)

## 2020-04-28 LAB — PROTIME-INR
INR: 1.4 — ABNORMAL HIGH (ref 0.8–1.2)
Prothrombin Time: 16.1 seconds — ABNORMAL HIGH (ref 11.4–15.2)

## 2020-04-28 LAB — APTT: aPTT: 35 seconds (ref 24–36)

## 2020-04-28 LAB — SEDIMENTATION RATE: Sed Rate: 12 mm/hr (ref 0–22)

## 2020-04-28 LAB — FOLATE: Folate: 15.6 ng/mL (ref >5.9)

## 2020-04-28 MED ORDER — ACETAMINOPHEN 325 MG PO TABS: 650.0000 mg | ORAL_TABLET | Freq: Four times a day (QID) | ORAL | Status: DC | PRN

## 2020-04-28 MED ORDER — LACTATED RINGERS IV BOLUS: 1000.0000 mL | Freq: Once | INTRAVENOUS | Status: DC

## 2020-04-28 MED ORDER — ONDANSETRON HCL 4 MG/2ML IJ SOLN
4.0000 mg | Freq: Four times a day (QID) | INTRAMUSCULAR | Status: DC
  Administered 2020-04-28 – 2020-05-01 (×11): 4 mg via INTRAVENOUS
  Filled 2020-04-28 (×12): qty 2

## 2020-04-28 MED ORDER — BOOST / RESOURCE BREEZE PO LIQD CUSTOM
1.0000 | Freq: Three times a day (TID) | ORAL | Status: DC
  Administered 2020-04-28 – 2020-04-29 (×2): 1 via ORAL

## 2020-04-28 MED ORDER — PANTOPRAZOLE SODIUM 40 MG IV SOLR
40.0000 mg | Freq: Two times a day (BID) | INTRAVENOUS | Status: DC
  Administered 2020-04-28 – 2020-05-01 (×6): 40 mg via INTRAVENOUS
  Filled 2020-04-28 (×7): qty 40

## 2020-04-28 MED ORDER — LACTATED RINGERS IV SOLN
INTRAVENOUS | Status: DC
  Administered 2020-04-28: 125 mL/h via INTRAVENOUS

## 2020-04-28 MED ORDER — SODIUM CHLORIDE 0.9 % IV SOLN: Freq: Once | INTRAVENOUS | Status: AC

## 2020-04-28 MED ORDER — METOCLOPRAMIDE HCL 5 MG/ML IJ SOLN
10.0000 mg | Freq: Four times a day (QID) | INTRAMUSCULAR | Status: DC | PRN
  Administered 2020-04-29 – 2020-05-01 (×3): 10 mg via INTRAVENOUS
  Filled 2020-04-28 (×3): qty 2

## 2020-04-28 MED ORDER — SODIUM CHLORIDE 0.9 % IV SOLN
2.0000 g | INTRAVENOUS | Status: DC
  Administered 2020-04-28 – 2020-04-30 (×3): 2 g via INTRAVENOUS
  Filled 2020-04-28: qty 20
  Filled 2020-04-28 (×2): qty 2
  Filled 2020-04-28: qty 20

## 2020-04-28 MED ORDER — MELATONIN 3 MG PO TABS
6.0000 mg | ORAL_TABLET | Freq: Every evening | ORAL | Status: DC | PRN
  Administered 2020-04-28 – 2020-04-29 (×2): 6 mg via ORAL
  Filled 2020-04-28 (×2): qty 2

## 2020-04-28 MED ORDER — ACETAMINOPHEN 650 MG RE SUPP: 650.0000 mg | Freq: Four times a day (QID) | RECTAL | Status: DC | PRN

## 2020-04-28 MED ORDER — METRONIDAZOLE IN NACL 5-0.79 MG/ML-% IV SOLN
500.0000 mg | Freq: Three times a day (TID) | INTRAVENOUS | Status: DC
  Administered 2020-04-28 – 2020-05-01 (×9): 500 mg via INTRAVENOUS
  Filled 2020-04-28 (×9): qty 100

## 2020-04-28 MED ORDER — KETOROLAC TROMETHAMINE 30 MG/ML IJ SOLN
30.0000 mg | Freq: Four times a day (QID) | INTRAMUSCULAR | Status: DC | PRN
  Administered 2020-04-28: 30 mg via INTRAVENOUS
  Filled 2020-04-28: qty 1

## 2020-04-28 NOTE — ED Notes (Signed)
PO challenged with no further vomiting

## 2020-04-28 NOTE — Plan of Care (Signed)
  Problem: Health Behavior/Discharge Planning: Goal: Ability to manage health-related needs will improve Outcome: Progressing   Problem: Activity: Goal: Risk for activity intolerance will decrease Outcome: Progressing   Problem: Coping: Goal: Level of anxiety will decrease Outcome: Progressing   Problem: Elimination: Goal: Will not experience complications related to urinary retention Outcome: Progressing   

## 2020-04-28 NOTE — ED Notes (Signed)
Patient voiding in diaper - placed on purewick to collect sample

## 2020-04-28 NOTE — ED Notes (Signed)
[facility] at bedside 

## 2020-04-28 NOTE — H&P (Signed)
History and Physical    Stacy [name] FBP:[id number] DOB: Jul 13, 1981 DOA: 04/27/2020  PCP: [name], MD   Patient coming from: Home  I have personally briefly reviewed patient's old medical records in [place]  Chief Complaint: Abdominal pain, nausea and vomiting  HPI: Stacy [name] is a 40 y.o. female with medical history significant of intestinal pseudoobstruction, GERD, UTI recently treated with antibiotics presented with intractable nausea and vomiting.  Patient presented to [facility] ED on 04/26/2020 with worsening abdominal pain, nausea and vomiting.  Patient states that her symptoms have progressively gotten worse for the last 7 days.  She states that he ate a hamburger last week and and afterwards became nauseated and started vomiting.  She also had low-grade temperatures for which she was evaluated in urgent care and was follow-up follow-up recommendations ileus her Covid testing was negative.  She was given IV fluids at urgent care and then discharged.  Patient continued to have intermittent nausea with vomiting along with abdominal pain and her symptoms started getting worse.  He was unable to tolerate any oral intake.  She was taking Zofran with minimal relief.  She was also having intermittent diarrhea without blood or mucus.  She took Pepto-Bismol and her diarrhea improved.  She felt lightheaded.  She denies any chest pain, shortness of air, loss of consciousness, seizures, cough, shortness of breath.  ED Course: He was treated with IV fluids and antiemetics.  Abdominal x-ray showed partial small bowel obstruction versus ileus.  CT of the abdomen pelvis with contrast showed diffuse mild dilation of small bowel with associated air-fluid levels, unchanged since prior CT on 04/24/2010 with diffuse mild thickening of colon secondary to infectious or inflammatory enterocolitis.  Her symptoms did not improve.  She was subsequently transferred to [facility] for hospitalist evaluation.  Review of Systems: As per HPI otherwise all other systems were reviewed and are negative.   Past Medical History:  Diagnosis Date  . Anxiety   . Benign liver cyst   . Chronic intestinal pseudo-obstruction   . GERD (gastroesophageal reflux disease)   . History of UTI     Past Surgical History:  Procedure Laterality Date  . CESAREAN SECTION     twice 2010, 2013  . Esure  ~2015   No history  reports that she has never smoked. She has never used smokeless tobacco. She reports that she does not drink alcohol and does not use drugs.  Allergies  Allergen Reactions  . Tetracyclines & Related Swelling    Swelling in spine; required spinal tap.   . Erythromycin Other (See Comments)    unknown  . Raspberry Rash  . Sulfonamide Derivatives Rash    Family History  Problem Relation Age of Onset  . CAD Father   . Hypertension Father   . Diabetes Father   . Diabetes Brother   . Colon cancer Neg Hx   . Breast cancer Neg Hx     Prior to Admission medications   Medication Sig Start Date End Date Taking? Authorizing Provider  calcium carbonate 200 MG capsule Take 250 mg by mouth 2 (two) times daily with a meal.    [provider]  cholecalciferol (VITAMIN D3) 25 MCG (1000 UNIT) tablet TAKE 2 TABLETS (2,000 UNITS TOTAL) BY MOUTH DAILY. 12/31/19   [name], MD  famotidine (PEPCID) 20 MG tablet TAKE 1 TABLET BY MOUTH EVERYDAY AT BEDTIME 04/09/20   [name], MD  guaiFENesin-codeine (ROBITUSSIN AC) 100-10 MG/5ML syrup Take 5 mLs by mouth 3 (three) times daily as needed for cough. 01/18/20   [name], MD  montelukast (SINGULAIR) 10 MG tablet Take 10 mg by mouth daily. 01/08/20   [provider]  omeprazole (PRILOSEC) 40 MG capsule TAKE 1 CAPSULE BY MOUTH EVERY DAY 12/17/19   [name], MD  ondansetron (ZOFRAN ODT) 4 MG disintegrating tablet Take 1 tablet (4 mg total) by mouth every 8 (eight) hours as  needed for nausea or vomiting. 10/26/19   [name], MD  ondansetron (ZOFRAN) 4 MG tablet Take 1 tablet (4 mg total) by mouth every 8 (eight) hours as needed for nausea or vomiting. 04/26/20   Rayna Sexton, PA-C  POLY-IRON 150 150 MG capsule TAKE 1 CAPSULE BY MOUTH TWICE A DAY 04/11/20   [name], MD  polyethylene glycol (MIRALAX / GLYCOLAX) 17 g packet Take 17 g by mouth 2 (two) times daily.    [provider]  Prucalopride Succinate (MOTEGRITY) 2 MG TABS Take 2 mg by mouth daily. 02/20/19   [provider]  sucralfate (CARAFATE) 1 g tablet Take 1 tablet (1 g total) by mouth 4 (four) times daily -  with meals and at bedtime. 11/08/19   [name], MD  triamcinolone ointment (KENALOG) 0.1 % APPLY 1 APPLICATION TOPICALLY AT BEDTIME. 11/08/19 11/07/20  [name], MD    Physical Exam: Vitals:   04/28/20 1200 04/28/20 1230 04/28/20 1325 04/28/20 1450  BP: 98/64 (!) 94/56 (!) 95/55 103/63  Pulse: (!) 120 (!) 119 (!) 116 (!) 115  Resp: 16 20 (!) 22 18  Temp:   99 F (37.2 C) 99.7 F (37.6 C)  TempSrc:   Oral Oral  SpO2: 99% 98% 98% 99%  Weight:    38.6 kg  Height:    5\' 3"  (1.6 m)    Constitutional: NAD, calm, comfortable.  Very thinly built female. Vitals:   04/28/20 1200 04/28/20 1230 04/28/20 1325 04/28/20 1450  BP: 98/64 (!) 94/56 (!) 95/55 103/63  Pulse: (!) 120 (!) 119 (!) 116 (!) 115  Resp: 16 20 (!) 22 18  Temp:   99 F (37.2 C) 99.7 F (37.6 C)  TempSrc:   Oral Oral  SpO2: 99% 98% 98% 99%  Weight:    38.6 kg  Height:    5\' 3"  (1.6 m)   Eyes: PERRL, lids and conjunctivae normal ENMT: Mucous membranes are dry. Posterior pharynx clear of any exudate or lesions. Neck: normal, supple, no masses, no thyromegaly Respiratory: bilateral decreased breath sounds at bases, no wheezing, no crackles. Normal respiratory effort. No accessory muscle use.  Cardiovascular: S1 S2 positive, rate controlled. No extremity edema. 2+ pedal  pulses.  Abdomen: Mild lower quadrant tenderness present, no masses palpated. No hepatosplenomegaly. Bowel sounds sluggish. Musculoskeletal: no clubbing / cyanosis. No joint deformity upper and lower extremities.  Skin: no rashes, lesions, ulcers. No induration Neurologic: CN 2-12 grossly intact. Moving extremities. No focal neurologic deficits.  Psychiatric: Normal judgment and insight. Alert and oriented x 3. Normal mood.    Labs on Admission: I have personally reviewed following labs and imaging studies  CBC: Recent Labs  Lab 04/26/20 1651 04/27/20 2111  WBC 9.4 9.0  NEUTROABS  --  6.0  HGB 10.1* 8.2*  HCT 30.5* 24.5*  MCV 84.7 84.5  PLT 478* 099   Basic Metabolic Panel: Recent Labs  Lab 04/26/20 1651 04/27/20 2111  NA 133* 132*  K 3.8 3.6  CL 102 101  CO2 21* 22  GLUCOSE 92 91  BUN 21* 19  CREATININE 0.48 0.39*  CALCIUM 7.9* 7.3*   GFR: Estimated Creatinine Clearance: 57 mL/min (A) (by C-G formula based on SCr of 0.39 mg/dL (L)). Liver Function Tests: Recent Labs  Lab 04/26/20 1651 04/27/20 2111 04/28/20 0130  AST 13* 10* 10*  ALT 15 12 12   ALKPHOS 100 77 69  BILITOT 1.0 1.6* 1.2  PROT 5.6* 4.3* 3.9*  ALBUMIN 2.1* 1.6* 1.4*   Recent Labs  Lab 04/26/20 1651  LIPASE 17   No results for input(s): AMMONIA in the last 168 hours. Coagulation Profile: Recent Labs  Lab 04/28/20 0130  INR 1.4*   Cardiac Enzymes: No results for input(s): CKTOTAL, CKMB, CKMBINDEX, TROPONINI in the last 168 hours. BNP (last 3 results) No results for input(s): PROBNP in the last 8760 hours. HbA1C: No results for input(s): HGBA1C in the last 72 hours. CBG: No results for input(s): GLUCAP in the last 168 hours. Lipid Profile: No results for input(s): CHOL, HDL, LDLCALC, TRIG, CHOLHDL, LDLDIRECT in the last 72 hours. Thyroid Function Tests: No results for input(s): TSH, T4TOTAL, FREET4, T3FREE, THYROIDAB in the last 72 hours. Anemia Panel: Recent Labs    04/28/20 0131   VITAMINB12 2,634*  FOLATE 15.6  TIBC NOT CALCULATED  IRON 69   Urine analysis:    Component Value Date/Time   COLORURINE YELLOW 04/28/2020 0500   APPEARANCEUR CLOUDY (A) 04/28/2020 0500   LABSPEC 1.020 04/28/2020 0500   PHURINE 6.0 04/28/2020 0500   GLUCOSEU NEGATIVE 04/28/2020 0500   HGBUR MODERATE (A) 04/28/2020 0500   BILIRUBINUR NEGATIVE 04/28/2020 0500   KETONESUR 15 (A) 04/28/2020 0500   PROTEINUR NEGATIVE 04/28/2020 0500   UROBILINOGEN 0.2 09/13/2013 0322   NITRITE NEGATIVE 04/28/2020 0500   LEUKOCYTESUR TRACE (A) 04/28/2020 0500    Radiological Exams on Admission: CT ABDOMEN PELVIS W CONTRAST  Result Date: 04/26/2020 CLINICAL DATA:  Abdominal pain.  Bowel obstruction suspected EXAM: CT ABDOMEN AND PELVIS WITH CONTRAST TECHNIQUE: Multidetector CT imaging of the abdomen and pelvis was performed using the standard protocol following bolus administration of intravenous contrast. CONTRAST:  59mL OMNIPAQUE IOHEXOL 300 MG/ML  SOLN COMPARISON:  04/25/2019 FINDINGS: Lower chest: There are trace bilateral pleural effusions. The heart size is unremarkable. There is atelectasis at the left lung base. Hepatobiliary: Again noted is intrahepatic and extrahepatic biliary ductal dilatation which is similar in appearance to prior study. Multiple liver masses are noted, specifically within the right hepatic lobe and the caudate lobe. The gallbladder is contracted and therefore poorly evaluated.There is no biliary ductal dilation. Pancreas: Normal contours without ductal dilatation. No peripancreatic fluid collection. Spleen: Unremarkable. Adrenals/Urinary Tract: --Adrenal glands: Unremarkable. --Right kidney/ureter: No hydronephrosis or radiopaque kidney stones. --Left kidney/ureter: No hydronephrosis or radiopaque kidney stones. --Urinary bladder: Unremarkable. Stomach/Bowel: --Stomach/Duodenum: There is an air-fluid level in the stomach. --Small bowel: The there is diffuse mild dilatation of  virtually all of the small bowel loops. Many of the small bowel loops contain air-fluid levels there is relative decompression of the terminal ileum. --Colon: There is mild diffuse wall thickening of the colon with apparent loss of haustral. --Appendix: Normal. Vascular/Lymphatic: Normal course and caliber of the major abdominal vessels. --No retroperitoneal lymphadenopathy. --there are mildly enlarged mesenteric lymph nodes, especially in the right lower quadrant. --No pelvic or inguinal lymphadenopathy. Reproductive: Bilateral Essure devices are noted. Other: No ascites or free air. The abdominal wall is normal. Musculoskeletal. No acute  displaced fractures. IMPRESSION: 1. Diffuse mild dilatation of virtually all of the small bowel with associated air-fluid levels. This appearance is very similar to the patient's CT dated 04/25/2019. There is no clear transition point. Additionally, there is mild diffuse wall thickening of the colon. Findings may be secondary to infectious or inflammatory enterocolitis. 2. Essentially unchanged intrahepatic biliary ductal dilatation. Correlation with laboratory studies is recommended. 3. Trace bilateral pleural effusions. Electronically Signed   By: Constance Holster M.D.   On: 04/26/2020 20:49   DG Abdomen Acute W/Chest  Result Date: 04/26/2020 CLINICAL DATA:  One-week history of nausea, diarrhea and anorexia. Negative COVID-19 tests recently. EXAM: ACUTE ABDOMEN SERIES (ABDOMEN TWO VIEWS AND CHEST ONE VIEW) COMPARISON:  Acute abdomen series 04/22/2019 and CT abdomen and pelvis 04/25/2019. FINDINGS: Gaseous distention of numerous loops of small bowel throughout the abdomen and pelvis demonstrating air-fluid levels on the ERECT image. Gas and liquid stool in normal caliber colon. No evidence of free intraperitoneal air on the [employer]. Essure devices in the fallopian tubes in the pelvis. No visible opaque urinary tract calculi. Regional skeleton unremarkable.  Cardiomediastinal silhouette unremarkable and unchanged. Lungs clear. Bronchovascular markings normal. Pulmonary vascularity normal. No visible pleural effusions. No pneumothorax. IMPRESSION: 1. Partial small bowel obstruction is favored over small bowel ileus. 2. No free intraperitoneal air. 3. No acute cardiopulmonary disease. Electronically Signed   By: Evangeline Dakin M.D.   On: 04/26/2020 20:11      Assessment/Plan  Intractable nausea and vomiting/abdominal pain Dehydration History of chronic intestinal pseudoobstruction -Patient has had intractable pain with nausea and vomiting.  CT of the abdomen and pelvis showed diffuse mild dilation of small bowel with associated air-fluid levels, unchanged since prior CT on 04/24/2010 with diffuse mild thickening of colon secondary to infectious or inflammatory enterocolitis.   -She is persistently nausea/vomiting despite IV fluids and antiemetics treatment at [facility]. -She had a solid bowel movement this morning: Doubt that she has C. difficile colitis. -Stool for GI PCR.  Blood cultures. -Rocephin/Flagyl IV -IV fluids/analgesics. -Zofran around-the-clock for today.  Use Reglan as needed -Notified GI/Dr. Penelope Coop regarding the need for consultation.  Patient follows up with Dr. Michail Sermon normally. -We will hold off on NG tube placement for now.  Probable UTI -UA still suggestive of UTI.  Follow urine cultures.  Continue Rocephin.  Hyponatremia -Continue IV fluids.  Repeat a.m. labs  Chronic normocytic anemia  -Questionable cause.  No signs of bleeding.  Monitor   DVT prophylaxis: SCDs/early ambulation Code Status: Full Family Communication: None at bedside Disposition Plan: Home in 1 to 2 days once clinically improved Consults called: GI/Dr. Penelope Coop Admission status: MedSurg/inpatient  Severity of Illness: The appropriate patient status for this patient is INPATIENT. Inpatient status is judged to be reasonable and necessary in order to provide  the required intensity of service to ensure the patient's safety. The patient's presenting symptoms, physical exam findings, and initial radiographic and laboratory data in the context of their chronic comorbidities is felt to place them at high risk for further clinical deterioration. Furthermore, it is not anticipated that the patient will be medically stable for discharge from the hospital within 2 midnights of admission. The following factors support the patient status of inpatient.   " The patient's presenting symptoms include intractable nausea and vomiting. " The worrisome physical exam findings include mild abdominal tenderness/dehydration " The initial radiographic and laboratory data are worrisome because of dilated small bowel loops with thickening " The chronic co-morbidities include interstitial pseudoobstruction.   *  I certify that at the point of admission it is my clinical judgment that the patient will require inpatient hospital care spanning beyond 2 midnights from the point of admission due to high intensity of service, high risk for further deterioration and high frequency of surveillance required.Aline August MD [facility]  04/28/2020, 3:51 PM

## 2020-04-28 NOTE — Progress Notes (Signed)
Patient admitted from [place]. Admitting physician notified.

## 2020-04-29 LAB — HEMOGLOBIN AND HEMATOCRIT, BLOOD
HCT: 25.8 % — ABNORMAL LOW (ref 36.0–46.0)
Hemoglobin: 8.2 g/dL — ABNORMAL LOW (ref 12.0–15.0)

## 2020-04-29 LAB — GASTROINTESTINAL PANEL BY PCR, STOOL (REPLACES STOOL CULTURE)

## 2020-04-29 LAB — COMPREHENSIVE METABOLIC PANEL
ALT: 12 U/L (ref 0–44)
AST: 9 U/L — ABNORMAL LOW (ref 15–41)
Albumin: 1.5 g/dL — ABNORMAL LOW (ref 3.5–5.0)
Alkaline Phosphatase: 64 U/L (ref 38–126)
Anion gap: 7 (ref 5–15)
BUN: 15 mg/dL (ref 6–20)
CO2: 19 mmol/L — ABNORMAL LOW (ref 22–32)
Calcium: 7.1 mg/dL — ABNORMAL LOW (ref 8.9–10.3)
Chloride: 106 mmol/L (ref 98–111)
Creatinine, Ser: 0.34 mg/dL — ABNORMAL LOW (ref 0.44–1.00)
GFR calc Af Amer: >60 mL/min (ref >60)
GFR calc non Af Amer: >60 mL/min (ref >60)
Glucose, Bld: 115 mg/dL — ABNORMAL HIGH (ref 70–99)
Potassium: 3.2 mmol/L — ABNORMAL LOW (ref 3.5–5.1)
Sodium: 132 mmol/L — ABNORMAL LOW (ref 135–145)
Total Bilirubin: 0.6 mg/dL (ref 0.3–1.2)
Total Protein: 3.8 g/dL — ABNORMAL LOW (ref 6.5–8.1)

## 2020-04-29 LAB — CBC
HCT: 19.8 % — ABNORMAL LOW (ref 36.0–46.0)
Hemoglobin: 6.2 g/dL — CL (ref 12.0–15.0)
MCH: 28.1 pg (ref 26.0–34.0)
MCHC: 31.3 g/dL (ref 30.0–36.0)
MCV: 89.6 fL (ref 80.0–100.0)
Platelets: 322 10*3/uL (ref 150–400)
RBC: 2.21 MIL/uL — ABNORMAL LOW (ref 3.87–5.11)
RDW: 13.3 % (ref 11.5–15.5)
WBC: 6.8 10*3/uL (ref 4.0–10.5)
nRBC: 0 % (ref 0.0–0.2)

## 2020-04-29 LAB — C DIFFICILE QUICK SCREEN W PCR REFLEX
C Diff antigen: NEGATIVE
C Diff interpretation: NOT DETECTED
C Diff toxin: NEGATIVE

## 2020-04-29 LAB — PREPARE RBC (CROSSMATCH)

## 2020-04-29 LAB — MAGNESIUM: Magnesium: 1.3 mg/dL — ABNORMAL LOW (ref 1.7–2.4)

## 2020-04-29 LAB — TISSUE TRANSGLUTAMINASE, IGA: Tissue Transglutaminase Ab, IgA: <2 U/mL (ref 0–3)

## 2020-04-29 LAB — GLIADIN ANTIBODIES, SERUM
Antigliadin Abs, IgA: 8 units (ref 0–19)
Gliadin IgG: 4 units (ref 0–19)

## 2020-04-29 LAB — LIPASE, BLOOD: Lipase: 21 U/L (ref 11–51)

## 2020-04-29 LAB — MRSA PCR SCREENING: MRSA by PCR: NEGATIVE

## 2020-04-29 MED ORDER — MAGNESIUM SULFATE 2 GM/50ML IV SOLN
2.0000 g | Freq: Once | INTRAVENOUS | Status: AC
  Administered 2020-04-29: 2 g via INTRAVENOUS
  Filled 2020-04-29: qty 50

## 2020-04-29 MED ORDER — POTASSIUM CHLORIDE 10 MEQ/100ML IV SOLN
10.0000 meq | INTRAVENOUS | Status: AC
  Administered 2020-04-29 (×2): 10 meq via INTRAVENOUS
  Filled 2020-04-29 (×2): qty 100

## 2020-04-29 MED ORDER — ALBUMIN HUMAN 5 % IV SOLN
12.5000 g | Freq: Once | INTRAVENOUS | Status: AC
  Administered 2020-04-29: 12.5 g via INTRAVENOUS
  Filled 2020-04-29: qty 250

## 2020-04-29 MED ORDER — CHLORHEXIDINE GLUCONATE CLOTH 2 % EX PADS
6.0000 | MEDICATED_PAD | Freq: Every day | CUTANEOUS | Status: DC
  Administered 2020-04-29 – 2020-05-01 (×3): 6 via TOPICAL

## 2020-04-29 MED ORDER — SODIUM CHLORIDE 0.9% IV SOLUTION: Freq: Once | INTRAVENOUS | Status: DC

## 2020-04-29 MED ORDER — SODIUM CHLORIDE 0.9 % IV BOLUS
500.0000 mL | Freq: Once | INTRAVENOUS | Status: AC
  Administered 2020-04-29: 500 mL via INTRAVENOUS

## 2020-04-29 NOTE — Progress Notes (Signed)
Persistent hypotension with MAP <60 despite NS and albumin.  Transferred to stepdown for possible pressors if hypotension continues to worsen.

## 2020-04-29 NOTE — Progress Notes (Signed)
Patient ID: Stacy [name], female   DOB: 09-27-1980, 39 y.o.   MRN: [id number]  PROGRESS NOTE    Stacy [name]  Stacy [name] DOB: 1981/03/29 DOA: 04/27/2020 PCP: [name], MD   Brief Narrative:  39 y.o. female with medical history significant of intestinal pseudoobstruction, GERD, UTI recently treated with antibiotics presented with intractable nausea and vomiting to [facility] on 04/26/2020.  Despite IV fluids, antiemetics, her symptoms did not improve.  Abdominal x-ray showed partial small bowel obstruction versus ileus.  CT of the abdomen pelvis with contrast showed diffuse mild dilation of small bowel with associated air-fluid levels, unchanged since prior CT on 04/24/2010 with diffuse mild thickening of colon secondary to infectious or inflammatory enterocolitis.  She was subsequently transferred to [facility] for hospitalist evaluation.  Assessment & Plan:   Intractable nausea and vomiting/abdominal pain Dehydration History of chronic intestinal pseudoobstruction -Patient has had intractable pain with nausea and vomiting.  CT of the abdomen and pelvis showed diffuse mild dilation of small bowel with associated air-fluid levels, unchanged since prior CT on 04/24/2010 with diffuse mild thickening of colon secondary to infectious or inflammatory enterocolitis.   -She was persistently nausea/vomiting despite IV fluids and antiemetics treatment at [facility]. -Stool for GI PCR.  Blood cultures.  will also check stool for C. difficile because of multiple episodes of diarrhea since admission -Rocephin/Flagyl IV -IV fluids/analgesics. -Zofran around-the-clock for today.  Use Reglan as needed -Awaiting GI evaluation -No need for placement for now.  Hypotension -Patient has been moved to stepdown unit because of hypotension.  Blood pressure in the 70s/80s overnight.  Blood pressure currently in the high 80s.  Her  normal blood pressure is in the 90s.  Continue  maintenance IV fluids.  Probable UTI -UA still suggestive of UTI.  Follow urine cultures.  Continue Rocephin.  Hyponatremia -Continue IV fluids.  Repeat a.m. labs  Hypokalemia -Replace.  Repeat a.m. labs  Hypomagnesemia -Replace.  To be done labs  Hypoalbuminemia -Nutrition consult  Acute on chronic normocytic anemia  -Questionable cause.  No signs of bleeding.  Monitor.  Hemoglobin 6.2 this morning.  1 unit of packed red cells transfusion has been ordered.    DVT prophylaxis: SCDs/early ambulation Code Status: Full Family Communication:  Husband at bedside Disposition Plan: Status is: Inpatient  Remains inpatient appropriate because:Inpatient level of care appropriate due to severity of illness   Dispo: The patient is from: Home              Anticipated d/c is to: Home              Anticipated d/c date is: 2 days              Patient currently is not medically stable to d/c.   Consultants: [facility]  Procedures: None  Antimicrobials: Rocephin and Flagyl from 04/28/2020 onwards   Subjective: Patient seen and examined at bedside.  Still complains of intermittent nausea.  No vomiting since admission.  Abdominal pain is improving.  Had loose bowel movements overnight.  No overnight fever or chest pain reported.  Objective: Vitals:   04/29/20 0630 04/29/20 0645 04/29/20 0700 04/29/20 0800  BP: (!) 92/59  (!) 86/52 (!) 88/54  Pulse: 100 (!) 106 99 98  Resp: (!) 24 17 16 12   Temp:      TempSrc:      SpO2: 99% 98% 96% 98%  Weight:      Height:  Intake/Output Summary (Last 24 hours) at 04/29/2020 0827 Last data filed at 04/29/2020 0400 Gross per 24 hour  Intake 2180.67 ml  Output 350 ml  Net 1830.67 ml   Filed Weights   04/27/20 1930 04/28/20 1450  Weight: 38.6 kg 38.6 kg    Examination:  General exam: Looks chronically ill and older than stated age.  No distress Respiratory system: Bilateral decreased breath sounds at bases Cardiovascular  system: S1 & S2 heard, intermittently tachycardic Gastrointestinal system: Abdomen is nondistended, soft and nontender in the lower quadrant. Normal bowel sounds heard. Extremities: No cyanosis, clubbing, edema  Central nervous system: Alert and oriented. No focal neurological deficits. Moving extremities Skin: No rashes, lesions or ulcers Psychiatry: Looks anxious    Data Reviewed: I have personally reviewed following labs and imaging studies  CBC: Recent Labs  Lab 04/26/20 1651 04/27/20 2111 04/29/20 0315  WBC 9.4 9.0 6.8  NEUTROABS  --  6.0  --   HGB 10.1* 8.2* 6.2*  HCT 30.5* 24.5* 19.8*  MCV 84.7 84.5 89.6  PLT 478* 375 540   Basic Metabolic Panel: Recent Labs  Lab 04/26/20 1651 04/27/20 2111 04/29/20 0315  NA 133* 132* 132*  K 3.8 3.6 3.2*  CL 102 101 106  CO2 21* 22 19*  GLUCOSE 92 91 115*  BUN 21* 19 15  CREATININE 0.48 0.39* 0.34*  CALCIUM 7.9* 7.3* 7.1*  MG  --   --  1.3*   GFR: Estimated Creatinine Clearance: 57 mL/min (A) (by C-G formula based on SCr of 0.34 mg/dL (L)). Liver Function Tests: Recent Labs  Lab 04/26/20 1651 04/27/20 2111 04/28/20 0130 04/29/20 0315  AST 13* 10* 10* 9*  ALT 15 12 12 12   ALKPHOS 100 77 69 64  BILITOT 1.0 1.6* 1.2 0.6  PROT 5.6* 4.3* 3.9* 3.8*  ALBUMIN 2.1* 1.6* 1.4* 1.5*   Recent Labs  Lab 04/26/20 1651 04/29/20 0315  LIPASE 17 21   No results for input(s): AMMONIA in the last 168 hours. Coagulation Profile: Recent Labs  Lab 04/28/20 0130  INR 1.4*   Cardiac Enzymes: No results for input(s): CKTOTAL, CKMB, CKMBINDEX, TROPONINI in the last 168 hours. BNP (last 3 results) No results for input(s): PROBNP in the last 8760 hours. HbA1C: No results for input(s): HGBA1C in the last 72 hours. CBG: No results for input(s): GLUCAP in the last 168 hours. Lipid Profile: No results for input(s): CHOL, HDL, LDLCALC, TRIG, CHOLHDL, LDLDIRECT in the last 72 hours. Thyroid Function Tests: No results for input(s):  TSH, T4TOTAL, FREET4, T3FREE, THYROIDAB in the last 72 hours. Anemia Panel: Recent Labs    04/28/20 0131  VITAMINB12 2,634*  FOLATE 15.6  TIBC NOT CALCULATED  IRON 69   Sepsis Labs: No results for input(s): PROCALCITON, LATICACIDVEN in the last 168 hours.  Recent Results (from the past 240 hour(s))  SARS Coronavirus 2 by RT PCR (hospital order, performed in [facility] hospital lab) Nasopharyngeal Nasopharyngeal Swab     Status: None   Collection Time: 04/26/20  7:41 PM   Specimen: Nasopharyngeal Swab  Result Value Ref Range Status   SARS Coronavirus 2 NEGATIVE NEGATIVE Final    Comment: (NOTE) SARS-CoV-2 target nucleic acids are NOT DETECTED.  The SARS-CoV-2 RNA is generally detectable in upper and lower respiratory specimens during the acute phase of infection. The lowest concentration of SARS-CoV-2 viral copies this assay can detect is 250 copies / mL. A negative result does not preclude SARS-CoV-2 infection and should not be used as  the sole basis for treatment or other patient management decisions.  A negative result may occur with improper specimen collection / handling, submission of specimen other than nasopharyngeal swab, presence of viral mutation(s) within the areas targeted by this assay, and inadequate number of viral copies (<250 copies / mL). A negative result must be combined with clinical observations, patient history, and epidemiological information.  Fact Sheet for Patients:   StrictlyIdeas.no  Fact Sheet for Healthcare Providers: BankingDealers.co.za  This test is not yet approved or  cleared by the [place] FDA and has been authorized for detection and/or diagnosis of SARS-CoV-2 by FDA under an Emergency Use Authorization (EUA).  This EUA will remain in effect (meaning this test can be used) for the duration of the COVID-19 declaration under Section 564(b)(1) of the Act, 21 U.S.C. section  360bbb-3(b)(1), unless the authorization is terminated or revoked sooner.  Performed at [facility], [address] [place], [place] [address]   SARS Coronavirus 2 by RT PCR (hospital order, performed in [facility] hospital lab) Nasopharyngeal Nasopharyngeal Swab     Status: None   Collection Time: 04/27/20  9:11 PM   Specimen: Nasopharyngeal Swab  Result Value Ref Range Status   SARS Coronavirus 2 NEGATIVE NEGATIVE Final    Comment: (NOTE) SARS-CoV-2 target nucleic acids are NOT DETECTED.  The SARS-CoV-2 RNA is generally detectable in upper and lower respiratory specimens during the acute phase of infection. The lowest concentration of SARS-CoV-2 viral copies this assay can detect is 250 copies / mL. A negative result does not preclude SARS-CoV-2 infection and should not be used as the sole basis for treatment or other patient management decisions.  A negative result may occur with improper specimen collection / handling, submission of specimen other than nasopharyngeal swab, presence of viral mutation(s) within the areas targeted by this assay, and inadequate number of viral copies (<250 copies / mL). A negative result must be combined with clinical observations, patient history, and epidemiological information.  Fact Sheet for Patients:   StrictlyIdeas.no  Fact Sheet for Healthcare Providers: BankingDealers.co.za  This test is not yet approved or  cleared by the [place] FDA and has been authorized for detection and/or diagnosis of SARS-CoV-2 by FDA under an Emergency Use Authorization (EUA).  This EUA will remain in effect (meaning this test can be used) for the duration of the COVID-19 declaration under Section 564(b)(1) of the Act, 21 U.S.C. section 360bbb-3(b)(1), unless the authorization is terminated or revoked sooner.  Performed at [facility], [address] [place], [place] [address]     Culture, blood (routine x 2)     Status: None (Preliminary result)   Collection Time: 04/28/20  4:30 PM   Specimen: Left Antecubital; Blood  Result Value Ref Range Status   Specimen Description   Final    LEFT ANTECUBITAL BLOOD Performed at [facility], [place] [address], [place], [place] [address]    Special Requests   Final    BOTTLES DRAWN AEROBIC AND ANAEROBIC Blood Culture results may not be optimal due to an inadequate volume of blood received in culture bottles Performed at [facility] [address], [place], [place] [address]    Culture   Final    NO GROWTH < 12 HOURS Performed at [facility] [address] [place], [place] [address]    Report Status PENDING  Incomplete  Culture, blood (routine x 2)  Status: None (Preliminary result)   Collection Time: 04/28/20  4:30 PM   Specimen: BLOOD LEFT FOREARM  Result Value Ref Range Status   Specimen Description   Final    BLOOD LEFT FOREARM Performed at [facility] [address] [place], [place] [address]    Special Requests   Final    BOTTLES DRAWN AEROBIC AND ANAEROBIC Blood Culture adequate volume Performed at [facility] [address], [place], [place] [address]    Culture   Final    NO GROWTH < 12 HOURS Performed at [facility] [address], [place], [place] [address]    Report Status PENDING  Incomplete  MRSA PCR Screening     Status: None   Collection Time: 04/29/20  3:15 AM   Specimen: Nasal Mucosa; Nasopharyngeal  Result Value Ref Range Status   MRSA by PCR NEGATIVE NEGATIVE Final    Comment:        The GeneXpert MRSA Assay (FDA approved for NASAL specimens only), is one component of a comprehensive MRSA colonization surveillance program. It is not intended to diagnose MRSA infection nor to guide or monitor treatment for MRSA infections. Performed at [facility], [place] [address] [place],   [address]          Radiology Studies: No results found.      Scheduled Meds: . sodium chloride   Intravenous Once  . Chlorhexidine Gluconate Cloth  6 each Topical Daily  . feeding supplement  1 Container Oral TID BM  . ondansetron (ZOFRAN) IV  4 mg Intravenous Q6H  . pantoprazole (PROTONIX) IV  40 mg Intravenous Q12H   Continuous Infusions: . cefTRIAXone (ROCEPHIN)  IV 2 g (04/28/20 1725)  . lactated ringers    . lactated ringers Stopped (04/29/20 0125)  . metronidazole 500 mg (04/29/20 0048)          Aline August, MD [facility] 04/29/2020, 8:27 AM

## 2020-04-29 NOTE — Progress Notes (Signed)
   04/29/20 0101  Provider Notification  Provider Name/Title M.Sharlet Salina NP  Date Provider Notified 04/29/20  Time Provider Notified 0115  Notification Type Page  Notification Reason Other (Comment) (bp continues hypotension, pulse a bit rapid)  Response See new orders  Date of Provider Response 04/29/20  Rapid Response Notification  Name of Rapid Response RN Notified RRN  Date Rapid Response Notified 04/29/20  Time Rapid Response Notified [id number]

## 2020-04-29 NOTE — Progress Notes (Signed)
CRITICAL VALUE ALERT  Critical Value:  Hgb   Date & Time Notied:  04/29/20 0400  Provider Notified: M. Sharlet Salina, [place]  Orders Received/Actions taken: Transfuse 1 unit of blood.

## 2020-04-29 NOTE — Progress Notes (Signed)
Pt with multiple eipisodes of explosive silver liquid stools. NP aware

## 2020-04-29 NOTE — Progress Notes (Signed)
Pt's husband notified of pt's transfer to [id number]

## 2020-04-29 NOTE — Consult Note (Signed)
Subjective:   HPI  The patient is a 39 year old female who we are asked to see in consultation.  She has a history of intestinal pseudoobstruction with recurring symptoms over the last several years.  [place] and has had recurrent symptoms over the years.  She states she has been to [facility] and they told her there was nothing they could really do for this.  She was admitted to this hospital because of ongoing symptoms of nausea and vomiting over the past week or so which she states occurred after eating a hamburger.  She thinks she may have gotten food poisoning.  She had associated low-grade temperature.  She has been treated with IV fluids and antiemetics and today feels better.  She has had some loose stools.  Stools are being checked for pathogens.  An abdominal x-ray showed findings compatible with either small bowel obstruction versus ileus and a CT scan showed diffuse mild dilatation of the bowel with air-fluid levels.  She was transferred to the ICU last night after getting hypotensive.  This is better today.  Her hemoglobin did drop but there have been no signs of bleeding.    Past Medical History:  Diagnosis Date  . Anxiety   . Benign liver cyst   . Chronic intestinal pseudo-obstruction   . GERD (gastroesophageal reflux disease)   . History of UTI    Past Surgical History:  Procedure Laterality Date  . CESAREAN SECTION     twice 2010, 2013  . Esure  ~2015   Social History   Socioeconomic History  . Marital status: Married    Spouse name: Not on file  . Number of children: 2  . Years of education: Not on file  . Highest education level: Not on file  Occupational History  . 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 Smoking status: Never Smoker  . Smokeless tobacco: Never Used  Vaping Use  . Vaping Use: Never used  Substance and Sexual Activity  . Alcohol use: No    Comment: socially   . Drug use: No  .  Sexual activity: Yes    Birth control/protection: None  Other Topics Concern  . Not on file  Social History Narrative   Household: pt, husband , 2 children   2 boys: 2010, 2013   P2G2   Original  from [place], no foreign trips   Social Determinants of Radio broadcast assistant Strain:   . Difficulty of Paying Living Expenses: Not on file  Food Insecurity:   . Worried About Charity fundraiser in the Last Year: Not on file  . Ran Out of Food in the Last Year: Not on file  Transportation Needs:   . Lack of Transportation (Medical): Not on file  . Lack of Transportation (Non-Medical): Not on file  Physical Activity:   . Days of Exercise per Week: Not on file  . Minutes of Exercise per Session: Not on file  Stress:   . Feeling of Stress : Not on file  Social Connections:   . Frequency of Communication with Friends and Family: Not on file  . Frequency of Social Gatherings with Friends and Family: Not on file  . Attends Religious Services: Not on file  . Active Member of Clubs or Organizations: Not on file  . Attends Archivist Meetings: Not on file  . Marital Status: Not on file  Intimate Partner Violence:   .  Fear of Current or Ex-Partner: Not on file  . Emotionally Abused: Not on file  . Physically Abused: Not on file  . Sexually Abused: Not on file   family history includes CAD in her father; Diabetes in her brother and father; Hypertension in her father.  Current Facility-Administered Medications:  .  0.9 %  sodium chloride infusion (Manually program via Guardrails IV Fluids), , Intravenous, Once, Lang Snow, FNP .  acetaminophen (TYLENOL) tablet 650 mg, 650 mg, Oral, Q6H PRN **OR** acetaminophen (TYLENOL) suppository 650 mg, 650 mg, Rectal, Q6H PRN, Alekh, Kshitiz, MD .  cefTRIAXone (ROCEPHIN) 2 g in sodium chloride 0.9 % 100 mL IVPB, 2 g, Intravenous, Q24H, Alekh, Kshitiz, MD, Last Rate: 200 mL/hr at 04/28/20 1725, 2 g at 04/28/20 1725 .  Chlorhexidine  Gluconate Cloth 2 % PADS 6 each, 6 each, Topical, Daily, Aline August, MD, 6 each at 04/29/20 0329 .  feeding supplement (BOOST / RESOURCE BREEZE) liquid 1 Container, 1 Container, Oral, TID BM, Aline August, MD, 1 Container at 04/28/20 2056 .  ketorolac (TORADOL) 30 MG/ML injection 30 mg, 30 mg, Intravenous, Q6H PRN, Starla Link, Kshitiz, MD, 30 mg at 04/28/20 1559 .  lactated ringers bolus 1,000 mL, 1,000 mL, Intravenous, Once, Starla Link, Kshitiz, MD .  lactated ringers infusion, , Intravenous, Continuous, Aline August, MD, Stopped at 04/29/20 0125 .  melatonin tablet 6 mg, 6 mg, Oral, QHS PRN, Lang Snow, FNP, 6 mg at 04/28/20 2202 .  metoCLOPramide (REGLAN) injection 10 mg, 10 mg, Intravenous, Q6H PRN, Alekh, Kshitiz, MD .  metroNIDAZOLE (FLAGYL) IVPB 500 mg, 500 mg, Intravenous, Q8H, Aline August, MD, Stopped at 04/29/20 0945 .  ondansetron (ZOFRAN) injection 4 mg, 4 mg, Intravenous, Q6H, Alekh, Kshitiz, MD, 4 mg at 04/29/20 1146 .  pantoprazole (PROTONIX) injection 40 mg, 40 mg, Intravenous, Q12H, Alekh, Kshitiz, MD, 40 mg at 04/29/20 1023 Allergies  Allergen Reactions  . Tetracyclines & Related Swelling    Swelling in spine; required spinal tap.   . Erythromycin Other (See Comments)    Swelling in spine; required spinal tap  . Raspberry Rash  . Sulfonamide Derivatives Rash    Reaction to cream     Objective:     BP (!) 87/62   Pulse 99   Temp 98 F (36.7 C) (Oral)   Resp 16   Ht 5\' 3"  (1.6 m)   Wt 38.6 kg   SpO2 100%   BMI 15.06 kg/m   Alert and oriented  No acute distress  Heart regular rhythm no murmurs  Lungs clear  Abdomen, not distended, bowel sounds diminished, soft and nontender  Laboratory No components found for: D1    Assessment:     History of chronic intestinal pseudoobstruction  Suspect that recent acute problem is an enteritis which could be either inflammatory or infectious.  Drop in hemoglobin and hematocrit most likely from hydration       Plan:     Continue antibiotics empirically.  Check stools for pathogens.  Follow clinically.

## 2020-04-30 DIAGNOSIS — E876 Hypokalemia: Secondary | ICD-10-CM

## 2020-04-30 LAB — BPAM RBC
Blood Product Expiration Date: 202110082359
ISSUE DATE / TIME: 202109140929
Unit Type and Rh: 5100

## 2020-04-30 LAB — CBC WITH DIFFERENTIAL/PLATELET
Abs Immature Granulocytes: 0.33 10*3/uL — ABNORMAL HIGH (ref 0.00–0.07)
Basophils Absolute: 0 10*3/uL (ref 0.0–0.1)
Basophils Relative: 0 %
Eosinophils Absolute: 0.1 10*3/uL (ref 0.0–0.5)
Eosinophils Relative: 1 %
HCT: 26.5 % — ABNORMAL LOW (ref 36.0–46.0)
Hemoglobin: 8.4 g/dL — ABNORMAL LOW (ref 12.0–15.0)
Immature Granulocytes: 5 %
Lymphocytes Relative: 20 %
Lymphs Abs: 1.3 10*3/uL (ref 0.7–4.0)
MCH: 28.2 pg (ref 26.0–34.0)
MCHC: 31.7 g/dL (ref 30.0–36.0)
MCV: 88.9 fL (ref 80.0–100.0)
Monocytes Absolute: 0.7 10*3/uL (ref 0.1–1.0)
Monocytes Relative: 10 %
Neutro Abs: 4 10*3/uL (ref 1.7–7.7)
Neutrophils Relative %: 64 %
Platelets: 247 10*3/uL (ref 150–400)
RBC: 2.98 MIL/uL — ABNORMAL LOW (ref 3.87–5.11)
RDW: 13.3 % (ref 11.5–15.5)
WBC: 6.4 10*3/uL (ref 4.0–10.5)
nRBC: 0 % (ref 0.0–0.2)

## 2020-04-30 LAB — BASIC METABOLIC PANEL
Anion gap: 10 (ref 5–15)
BUN: 7 mg/dL (ref 6–20)
CO2: 23 mmol/L (ref 22–32)
Calcium: 6.9 mg/dL — ABNORMAL LOW (ref 8.9–10.3)
Chloride: 99 mmol/L (ref 98–111)
Creatinine, Ser: <0.3 mg/dL — ABNORMAL LOW (ref 0.44–1.00)
Glucose, Bld: 93 mg/dL (ref 70–99)
Potassium: 3.4 mmol/L — ABNORMAL LOW (ref 3.5–5.1)
Sodium: 132 mmol/L — ABNORMAL LOW (ref 135–145)

## 2020-04-30 LAB — TYPE AND SCREEN
ABO/RH(D): O POS
Antibody Screen: NEGATIVE
Unit division: 0

## 2020-04-30 LAB — URINE CULTURE

## 2020-04-30 LAB — OCCULT BLOOD X 1 CARD TO LAB, STOOL: Fecal Occult Bld: NEGATIVE

## 2020-04-30 LAB — MAGNESIUM: Magnesium: 1.7 mg/dL (ref 1.7–2.4)

## 2020-04-30 LAB — RETICULIN ANTIBODIES, IGA W TITER: Reticulin Ab, IgA: NEGATIVE titer (ref <2.5)

## 2020-04-30 MED ORDER — POTASSIUM CHLORIDE CRYS ER 20 MEQ PO TBCR
40.0000 meq | EXTENDED_RELEASE_TABLET | Freq: Once | ORAL | Status: AC
  Administered 2020-04-30: 40 meq via ORAL
  Filled 2020-04-30: qty 2

## 2020-04-30 MED ORDER — MAGNESIUM SULFATE 2 GM/50ML IV SOLN
2.0000 g | Freq: Once | INTRAVENOUS | Status: AC
  Administered 2020-04-30: 2 g via INTRAVENOUS
  Filled 2020-04-30: qty 50

## 2020-04-30 NOTE — TOC Initial Note (Signed)
Transition of Care [facility]) - Initial/Assessment Note    Patient Details  Name: Stacy [name] MRN: [id number] Date of Birth: 1980/12/11  [facility] of [facility]) CM/SW Contact:    Leeroy Cha, RN Phone Number: 04/30/2020, 10:00 AM  Clinical Narrative:                 39 y.o.femalewith medical history significant ofintestinal pseudoobstruction, GERD, UTI recently treated with antibiotics presented with intractable nausea and vomiting to [place] ED on 04/26/2020.  Despite IV fluids, antiemetics, her symptoms did not improve. Abdominal x-ray showed partial small bowel obstruction versus ileus. CT of the abdomen pelvis with contrast showed diffuse mild dilation of small bowel with associated air-fluid levels, unchanged since prior CT on 04/24/2010 with diffuse mild thickening of colon secondary to infectious or inflammatory enterocolitis. She was subsequently transferred to [facility] for hospitalist evaluation.  Assessment & Plan:   Intractable nausea and vomiting/abdominal pain Dehydration History of chronic intestinal pseudoobstruction -Patient has had intractable pain with nausea and vomiting. CT of the abdomen and pelvis showed diffuse mild dilation of small bowel with associated air-fluid levels, unchanged since prior CT on 04/24/2010 with diffuse mild thickening of colon secondary to infectious or inflammatory enterocolitis.  -She was persistently nausea/vomiting despite IV fluids and antiemetics treatment at [facility]. -Stool for GI PCR. Blood cultures.  will also check stool for C. difficile because of multiple episodes of diarrhea since admission -Rocephin/Flagyl IV -IV fluids/analgesics. -Zofran around-the-clock for today. Use Reglan as needed -Awaiting GI evaluation -No need for placement for now.  Hypotension -Patient has been moved to stepdown unit because of hypotension.  Blood pressure in the 70s/80s overnight.  Blood pressure currently in the  high 80s.  Her  normal blood pressure is in the 90s.  Continue maintenance IV fluids.  Probable UTI -UA still suggestive of UTI. Follow urine cultures. Continue Rocephin.  Hyponatremia -Continue IV fluids. Repeat a.m. labs  Hypokalemia -Replace.  Repeat a.m. labs  Hypomagnesemia -Replace.  To be done labs  Hypoalbuminemia -Nutrition consult  Acute on chronic normocytic anemia -Questionable cause. No signs of bleeding. Monitor.  Hemoglobin 6.2 this morning.  1 unit of packed red cells transfusion has been ordered. Plan is to retutn to home or transfer to  [facility] Expected Discharge Plan: Acute to Acute Transfer Barriers to Discharge: Continued Medical Work up   Patient Goals and CMS Choice Patient states their goals for this hospitalization and ongoing recovery are:: to go home or to [facility].gov Compare Post Acute Care list provided to:: Patient    Expected Discharge Plan and Services Expected Discharge Plan: Acute to Acute Transfer   Discharge Planning Services: CM Consult   Living arrangements for the past 2 months: Single Family Home                                      Prior Living Arrangements/Services Living arrangements for the past 2 months: Single Family Home Lives with:: Spouse Patient language and need for interpreter reviewed:: Yes Do you feel safe going back to the place where you live?: Yes      Need for Family Participation in Patient Care: Yes (Comment) Care giver support system in place?: Yes (comment)   Criminal Activity/Legal Involvement Pertinent to Current Situation/Hospitalization: No - Comment as needed  Activities of Daily Living Home Assistive Devices/Equipment: Eyeglasses ADL Screening (condition at time  of admission) Patient's cognitive ability adequate to safely complete daily activities?: Yes Is the patient deaf or have difficulty hearing?: No Does the patient have difficulty seeing, even when wearing  glasses/contacts?: No Does the patient have difficulty concentrating, remembering, or making decisions?: No Patient able to express need for assistance with ADLs?: Yes Does the patient have difficulty dressing or bathing?: No Independently performs ADLs?: Yes (appropriate for developmental age) Does the patient have difficulty walking or climbing stairs?: No Weakness of Legs: Both Weakness of Arms/Hands: Both  Permission Sought/Granted                  Emotional Assessment Appearance:: Appears stated age     Orientation: : Oriented to Self, Oriented to Place, Oriented to  Time, Oriented to Situation Alcohol / Substance Use: Not Applicable Psych Involvement: No (comment)  Admission diagnosis:  Failure to thrive in adult [R62.7] Intractable nausea and vomiting [R11.2] Patient Active Problem List   Diagnosis Date Noted  . Intractable nausea and vomiting 04/28/2020  . Failure to thrive in adult 04/27/2020  . Physical exam 01/01/2020  . Abdominal pain with vomiting 04/25/2019  . Hyponatremia 04/25/2019  . GERD (gastroesophageal reflux disease) 04/07/2019  . Acute lower UTI 04/07/2019  . Normocytic anemia 04/07/2019  . 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 Iron deficiency anemia 01/22/2018  . Vitamin B deficiency 01/22/2018  . Vitamin D deficiency 01/22/2018  . PCP NOTES >>>>>>>>>>>>>>>>>>>>>>>>>>>>> 06/28/2017  . Underweight 06/27/2017  . Cyst of ovary 10/05/2016  . Orthostatic hypotension 07/27/2013  . Hepatic adenoma 07/27/2013   PCP:  [name], MD Pharmacy:   [id number] [place], [place] - [address] [place] [address] Phone: [phone] Fax: [phone]     Social Determinants of Health (SDOH) Interventions    Readmission Risk Interventions No flowsheet data found.

## 2020-04-30 NOTE — Progress Notes (Signed)
[facility] Progress Note  Subjective: The patient is doing well today.  She has no complaints of abdominal pain, nausea or vomiting.  Stool studies were unrevealing for a defined infectious process.  Objective: Vital signs in last 24 hours: Temp:  [96.3 F (35.7 C)-98.1 F (36.7 C)] 96.3 F (35.7 C) (09/15 1230) Pulse Rate:  [82-110] 86 (09/15 1200) Resp:  [12-19] 13 (09/15 1200) BP: (84-115)/(53-83) 88/55 (09/15 1200) SpO2:  [95 %-100 %] 95 % (09/15 1200) Weight change:    PE:  No distress  Abdomen soft and nontender  Lab Results: Results for orders placed or performed during the hospital encounter of 04/27/20 (from the past 24 hour(s))  C Difficile Quick Screen w PCR reflex     Status: None   Collection Time: 04/29/20  7:30 PM   Specimen: STOOL  Result Value Ref Range   C Diff antigen NEGATIVE NEGATIVE   C Diff toxin NEGATIVE NEGATIVE   C Diff interpretation No C. difficile detected.   CBC with Differential/Platelet     Status: Abnormal   Collection Time: 04/30/20  2:37 AM  Result Value Ref Range   WBC 6.4 4.0 - 10.5 K/uL   RBC 2.98 (L) 3.87 - 5.11 MIL/uL   Hemoglobin 8.4 (L) 12.0 - 15.0 g/dL   HCT 26.5 (L) 36 - 46 %   MCV 88.9 80.0 - 100.0 fL   MCH 28.2 26.0 - 34.0 pg   MCHC 31.7 30.0 - 36.0 g/dL   RDW 13.3 11.5 - 15.5 %   Platelets 247 150 - 400 K/uL   nRBC 0.0 0.0 - 0.2 %   Neutrophils Relative % 64 %   Neutro Abs 4.0 1.7 - 7.7 K/uL   Lymphocytes Relative 20 %   Lymphs Abs 1.3 0.7 - 4.0 K/uL   Monocytes Relative 10 %   Monocytes Absolute 0.7 0 - 1 K/uL   Eosinophils Relative 1 %   Eosinophils Absolute 0.1 0 - 0 K/uL   Basophils Relative 0 %   Basophils Absolute 0.0 0 - 0 K/uL   WBC Morphology MILD LEFT SHIFT (1-5% METAS, OCC MYELO, OCC BANDS)    Immature Granulocytes 5 %   Abs Immature Granulocytes 0.33 (H) 0.00 - 0.07 K/uL  Basic metabolic panel     Status: Abnormal   Collection Time: 04/30/20  2:37 AM  Result Value Ref Range   Sodium  132 (L) 135 - 145 mmol/L   Potassium 3.4 (L) 3.5 - 5.1 mmol/L   Chloride 99 98 - 111 mmol/L   CO2 23 22 - 32 mmol/L   Glucose, Bld 93 70 - 99 mg/dL   BUN 7 6 - 20 mg/dL   Creatinine, Ser <0.30 (L) 0.44 - 1.00 mg/dL   Calcium 6.9 (L) 8.9 - 10.3 mg/dL   GFR calc non Af Amer NOT CALCULATED >60 mL/min   GFR calc Af Amer NOT CALCULATED >60 mL/min   Anion gap 10 5 - 15  Magnesium     Status: None   Collection Time: 04/30/20  2:37 AM  Result Value Ref Range   Magnesium 1.7 1.7 - 2.4 mg/dL    Studies/Results: No results found.    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  She seems to be quite stable at this time.  Anemia but no sign of overt gastrointestinal bleeding  Plan:   If she remains stable, and is able to tolerate oral intake, I would  continue supportive care and she could probably go home tomorrow on a combination of Cipro and Flagyl empirically for 1 week.  She can follow-up with her primary gastroenterologist Dr. Michail Sermon in the office.    Cassell Clement 04/30/2020, 2:18 PM  Pager: [phone] If no answer or after 5 PM call [phone]

## 2020-04-30 NOTE — Progress Notes (Signed)
C. Diff results negative. Removed precautions.

## 2020-04-30 NOTE — Progress Notes (Signed)
PROGRESS NOTE    Stacy [name]  XLK:[id number] DOB: 1981-03-26 DOA: 04/27/2020 PCP: [name], MD    Chief Complaint  Patient presents with  . Emesis    Brief Narrative:  39 y.o.femalewith medical history significant ofintestinal pseudoobstruction, GERD, UTI recently treated with antibiotics presented with intractable nausea and vomiting to [place] ED on 04/26/2020.  Despite IV fluids, antiemetics, her symptoms did not improve. Abdominal x-ray showed partial small bowel obstruction versus ileus. CT of the abdomen pelvis with contrast showed diffuse mild dilation of small bowel with associated air-fluid levels, unchanged since prior CT on 04/24/2010 with diffuse mild thickening of colon secondary to infectious or inflammatory enterocolitis. She was subsequently transferred to [facility] for hospitalist evaluation.  Subjective:  Feeling better, want to advance diet Blood pressure remain borderline  Assessment & Plan:   Active Problems:   Chronic intestinal pseudo-obstruction   Acute lower UTI   Normocytic anemia   Hyponatremia   Failure to thrive in adult   Intractable nausea and vomiting  Intractable nausea and vomiting/abdominal pain/Dehydration -History of chronic intestinal pseudoobstruction -Patient has had intractable pain with nausea and vomiting. CT of the abdomen and pelvis showed diffuse mild dilation of small bowel with associated air-fluid levels, unchanged since prior CT on 04/24/2010 with diffuse mild thickening of colon secondary to infectious or inflammatory enterocolitis -C. difficile negative, GI PCR panel negative -Case discussed with GI who recommend continue empiric antibiotic  Hyponatremia/hypokalemia/hypomagnesemia -Improving, but remain low, continue to replace -Repeat lab in the morning  Hypotension -Likely from dehydration, also concerning for colitis and possible UTI -Blood culture no growth -She received  hydration, continue IV antibiotics -Check a.m. cortisol level   Acute on chronic normocytic anemia -Hemoglobin globin on presentation 6.2, s/p 1 units PRBC transfusion, hemoglobin 8.4 this morning -Iron study was ordered but result was incomplete for unclear reason, now she is post transfusion will not repeat iron panel -B12 folate unremarkable, - total bilirubin within normal limits, less likely hemolysis -No overt sign of bleeding, occult blood test ordered  Possible UTI -Urine culture with multiple species -She is already on antibiotics  Underweight, hypoalbuminemia Body mass index is 15.06 kg/m. Nutrition consult placed   DVT prophylaxis: SCDs Start: 04/28/20 1543   Code Status: Family Communication: Husband at bedside Disposition:   Status is: Inpatient   Dispo: The patient is from: home              Anticipated d/c is to: home              Anticipated d/c date is: 24-48hrs pending oral intake, bp and hgb, needs GI clearance   Consultants:   [facility]  Procedures:   none  Antimicrobials:   Cipro and Flagyl     Objective: Vitals:   04/30/20 0351 04/30/20 0400 04/30/20 0500 04/30/20 0600  BP:  104/67 103/66 102/68  Pulse:  87 87 82  Resp:  13 15 15   Temp: 97.9 F (36.6 C)     TempSrc: Oral     SpO2:  99% 98% 98%  Weight:      Height:        Intake/Output Summary (Last 24 hours) at 04/30/2020 0712 Last data filed at 04/30/2020 3664 Gross per 24 hour  Intake 3498.27 ml  Output 100 ml  Net 3398.27 ml   Filed Weights   04/27/20 1930 04/28/20 1450  Weight: 38.6 kg 38.6 kg    Examination:  General exam: calm,  NAD Respiratory system: Clear to auscultation. Respiratory effort normal. Cardiovascular system: S1 & S2 heard, RRR. No JVD, no murmur, No pedal edema. Gastrointestinal system: Abdomen is nondistended, soft and nontender.  Normal bowel sounds heard. Central nervous system: Alert and oriented. No focal neurological  deficits. Extremities: Symmetric 5 x 5 power. Skin: No rashes, lesions or ulcers Psychiatry: Judgement and insight appear normal. Mood & affect appropriate.     Data Reviewed: I have personally reviewed following labs and imaging studies  CBC: Recent Labs  Lab 04/26/20 1651 04/27/20 2111 04/29/20 0315 04/29/20 1249 04/30/20 0237  WBC 9.4 9.0 6.8  --  6.4  NEUTROABS  --  6.0  --   --  4.0  HGB 10.1* 8.2* 6.2* 8.2* 8.4*  HCT 30.5* 24.5* 19.8* 25.8* 26.5*  MCV 84.7 84.5 89.6  --  88.9  PLT 478* 375 322  --  034    Basic Metabolic Panel: Recent Labs  Lab 04/26/20 1651 04/27/20 2111 04/29/20 0315 04/30/20 0237  NA 133* 132* 132* 132*  K 3.8 3.6 3.2* 3.4*  CL 102 101 106 99  CO2 21* 22 19* 23  GLUCOSE 92 91 115* 93  BUN 21* 19 15 7   CREATININE 0.48 0.39* 0.34* <0.30*  CALCIUM 7.9* 7.3* 7.1* 6.9*  MG  --   --  1.3* 1.7    GFR: CrCl cannot be calculated (This lab value cannot be used to calculate CrCl because it is not a number: <0.30).  Liver Function Tests: Recent Labs  Lab 04/26/20 1651 04/27/20 2111 04/28/20 0130 04/29/20 0315  AST 13* 10* 10* 9*  ALT 15 12 12 12   ALKPHOS 100 77 69 64  BILITOT 1.0 1.6* 1.2 0.6  PROT 5.6* 4.3* 3.9* 3.8*  ALBUMIN 2.1* 1.6* 1.4* 1.5*    CBG: No results for input(s): GLUCAP in the last 168 hours.   Recent Results (from the past 240 hour(s))  SARS Coronavirus 2 by RT PCR (hospital order, performed in [facility] hospital lab) Nasopharyngeal Nasopharyngeal Swab     Status: None   Collection Time: 04/26/20  7:41 PM   Specimen: Nasopharyngeal Swab  Result Value Ref Range Status   SARS Coronavirus 2 NEGATIVE NEGATIVE Final    Comment: (NOTE) SARS-CoV-2 target nucleic acids are NOT DETECTED.  The SARS-CoV-2 RNA is generally detectable in upper and lower respiratory specimens during the acute phase of infection. The lowest concentration of SARS-CoV-2 viral copies this assay can detect is 250 copies / mL. A negative result  does not preclude SARS-CoV-2 infection and should not be used as the sole basis for treatment or other patient management decisions.  A negative result may occur with improper specimen collection / handling, submission of specimen other than nasopharyngeal swab, presence of viral mutation(s) within the areas targeted by this assay, and inadequate number of viral copies (<250 copies / mL). A negative result must be combined with clinical observations, patient history, and epidemiological information.  Fact Sheet for Patients:   StrictlyIdeas.no  Fact Sheet for Healthcare Providers: BankingDealers.co.za  This test is not yet approved or  cleared by the [place] FDA and has been authorized for detection and/or diagnosis of SARS-CoV-2 by FDA under an Emergency Use Authorization (EUA).  This EUA will remain in effect (meaning this test can be used) for the duration of the COVID-19 declaration under Section 564(b)(1) of the Act, 21 U.S.C. section 360bbb-3(b)(1), unless the authorization is terminated or revoked sooner.  Performed at [facility], [place]  [address] [place], [place] [address]   SARS Coronavirus 2 by RT PCR (hospital order, performed in [facility] hospital lab) Nasopharyngeal Nasopharyngeal Swab     Status: None   Collection Time: 04/27/20  9:11 PM   Specimen: Nasopharyngeal Swab  Result Value Ref Range Status   SARS Coronavirus 2 NEGATIVE NEGATIVE Final    Comment: (NOTE) SARS-CoV-2 target nucleic acids are NOT DETECTED.  The SARS-CoV-2 RNA is generally detectable in upper and lower respiratory specimens during the acute phase of infection. The lowest concentration of SARS-CoV-2 viral copies this assay can detect is 250 copies / mL. A negative result does not preclude SARS-CoV-2 infection and should not be used as the sole basis for treatment or other patient management decisions.  A negative result may occur  with improper specimen collection / handling, submission of specimen other than nasopharyngeal swab, presence of viral mutation(s) within the areas targeted by this assay, and inadequate number of viral copies (<250 copies / mL). A negative result must be combined with clinical observations, patient history, and epidemiological information.  Fact Sheet for Patients:   StrictlyIdeas.no  Fact Sheet for Healthcare Providers: BankingDealers.co.za  This test is not yet approved or  cleared by the [place] FDA and has been authorized for detection and/or diagnosis of SARS-CoV-2 by FDA under an Emergency Use Authorization (EUA).  This EUA will remain in effect (meaning this test can be used) for the duration of the COVID-19 declaration under Section 564(b)(1) of the Act, 21 U.S.C. section 360bbb-3(b)(1), unless the authorization is terminated or revoked sooner.  Performed at [facility], [address] [place], [place] [address]   Urine culture     Status: Abnormal   Collection Time: 04/28/20  3:45 PM   Specimen: Urine, Random  Result Value Ref Range Status   Specimen Description   Final    URINE, RANDOM Performed at [facility], [place] [address] [place], [place] [address]    Special Requests   Final    NONE Performed at [facility], [place] [address], [place], [place] [address]    Culture MULTIPLE SPECIES PRESENT, SUGGEST RECOLLECTION (A)  Final   Report Status 04/30/2020 FINAL  Final  Gastrointestinal Panel by PCR , Stool     Status: None   Collection Time: 04/28/20  3:47 PM   Specimen: Stool  Result Value Ref Range Status   Campylobacter species NOT DETECTED NOT DETECTED Final   Plesimonas shigelloides NOT DETECTED NOT DETECTED Final   Salmonella species NOT DETECTED NOT DETECTED Final   Yersinia enterocolitica NOT DETECTED NOT DETECTED Final   Vibrio species NOT DETECTED NOT  DETECTED Final   Vibrio cholerae NOT DETECTED NOT DETECTED Final   Enteroaggregative E coli (EAEC) NOT DETECTED NOT DETECTED Final   Enteropathogenic E coli (EPEC) NOT DETECTED NOT DETECTED Final   Enterotoxigenic E coli (ETEC) NOT DETECTED NOT DETECTED Final   Shiga like toxin producing E coli (STEC) NOT DETECTED NOT DETECTED Final   Shigella/Enteroinvasive E coli (EIEC) NOT DETECTED NOT DETECTED Final   Cryptosporidium NOT DETECTED NOT DETECTED Final   Cyclospora cayetanensis NOT DETECTED NOT DETECTED Final   Entamoeba histolytica NOT DETECTED NOT DETECTED Final   Giardia lamblia NOT DETECTED NOT DETECTED Final   Adenovirus F40/41 NOT DETECTED NOT DETECTED Final   Astrovirus NOT DETECTED NOT DETECTED Final   Norovirus GI/GII NOT DETECTED NOT DETECTED Final   Rotavirus A NOT DETECTED NOT DETECTED Final  Sapovirus (I, II, IV, and V) NOT DETECTED NOT DETECTED Final    Comment: Performed at [facility], [place] [place], [place] [address]  Culture, blood (routine x 2)     Status: None (Preliminary result)   Collection Time: 04/28/20  4:30 PM   Specimen: BLOOD  Result Value Ref Range Status   Specimen Description   Final    BLOOD LEFT ANTECUBITAL Performed at [facility], [place] [address], [place], [place] [address]    Special Requests   Final    BOTTLES DRAWN AEROBIC AND ANAEROBIC Blood Culture results may not be optimal due to an inadequate volume of blood received in culture bottles Performed at [place] [address], [place], [place] [address]    Culture   Final    NO GROWTH < 24 HOURS Performed at [place] [address], [place], [place] [address]    Report Status PENDING  Incomplete  Culture, blood (routine x 2)     Status: None (Preliminary result)   Collection Time: 04/28/20  4:30 PM   Specimen: BLOOD LEFT FOREARM  Result Value Ref Range Status   Specimen Description   Final    BLOOD LEFT FOREARM Performed at  [place] [address] [place], [place] [address]    Special Requests   Final    BOTTLES DRAWN AEROBIC AND ANAEROBIC Blood Culture adequate volume Performed at [place] [address], [place], [place] [address]    Culture   Final    NO GROWTH < 24 HOURS Performed at [place] [address], [place], [place] [address]    Report Status PENDING  Incomplete  MRSA PCR Screening     Status: None   Collection Time: 04/29/20  3:15 AM   Specimen: Nasal Mucosa; Nasopharyngeal  Result Value Ref Range Status   MRSA by PCR NEGATIVE NEGATIVE Final    Comment:        The GeneXpert MRSA Assay (FDA approved for NASAL specimens only), is one component of a comprehensive MRSA colonization surveillance program. It is not intended to diagnose MRSA infection nor to guide or monitor treatment for MRSA infections. Performed at [facility], [place] [address] [place], [place], [place] [address]   C Difficile Quick Screen w PCR reflex     Status: None   Collection Time: 04/29/20  7:30 PM   Specimen: STOOL  Result Value Ref Range Status   C Diff antigen NEGATIVE NEGATIVE Final   C Diff toxin NEGATIVE NEGATIVE Final   C Diff interpretation No C. difficile detected.  Final    Comment: Performed at [facility], [place] [address], [place], [place] [address]         Radiology Studies: No results found.      Scheduled Meds: . sodium chloride   Intravenous Once  . Chlorhexidine Gluconate Cloth  6 each Topical Daily  . feeding supplement  1 Container Oral TID BM  . ondansetron (ZOFRAN) IV  4 mg Intravenous Q6H  . pantoprazole (PROTONIX) IV  40 mg Intravenous Q12H   Continuous Infusions: . cefTRIAXone (ROCEPHIN)  IV Stopped (04/29/20 1805)  . lactated ringers    . lactated ringers 125 mL/hr at 04/30/20 0628  . magnesium sulfate bolus IVPB    . metronidazole Stopped (04/30/20 0032)     LOS: 2 days    Time spent: 92mins Greater than 50% of this  time was spent in counseling, explanation of diagnosis, planning of further management, and coordination of care.  I have personally reviewed and interpreted on  04/30/2020 daily labs, tele strips, imagings as discussed above under date review session and assessment and plans.  I reviewed all nursing notes, pharmacy notes, consultant notes,  vitals, pertinent old records  I have discussed plan of care as described above with RN , patient and family on 04/30/2020  Voice Recognition /Dragon dictation system was used to create this note, attempts have been made to correct errors. Please contact the author with questions and/or clarifications.   Florencia Reasons, MD PhD FACP [facility]  Available via Epic secure chat 7am-7pm for nonurgent issues Please page for urgent issues To page the attending provider between 7A-7P or the covering provider during after hours 7P-7A, please log into the web site [email or web address] and access using universal [place] password for that web site. If you do not have the password, please call the hospital operator.    04/30/2020, 7:12 AM

## 2020-05-01 LAB — CBC WITH DIFFERENTIAL/PLATELET
Abs Immature Granulocytes: 0.29 10*3/uL — ABNORMAL HIGH (ref 0.00–0.07)
Basophils Absolute: 0 10*3/uL (ref 0.0–0.1)
Basophils Relative: 0 %
Eosinophils Absolute: 0.1 10*3/uL (ref 0.0–0.5)
Eosinophils Relative: 1 %
HCT: 27.7 % — ABNORMAL LOW (ref 36.0–46.0)
Hemoglobin: 9.2 g/dL — ABNORMAL LOW (ref 12.0–15.0)
Immature Granulocytes: 4 %
Lymphocytes Relative: 29 %
Lymphs Abs: 2 10*3/uL (ref 0.7–4.0)
MCH: 29.4 pg (ref 26.0–34.0)
MCHC: 33.2 g/dL (ref 30.0–36.0)
MCV: 88.5 fL (ref 80.0–100.0)
Monocytes Absolute: 0.7 10*3/uL (ref 0.1–1.0)
Monocytes Relative: 10 %
Neutro Abs: 3.7 10*3/uL (ref 1.7–7.7)
Neutrophils Relative %: 56 %
Platelets: 301 10*3/uL (ref 150–400)
RBC: 3.13 MIL/uL — ABNORMAL LOW (ref 3.87–5.11)
RDW: 13.6 % (ref 11.5–15.5)
WBC: 6.8 10*3/uL (ref 4.0–10.5)
nRBC: 0 % (ref 0.0–0.2)

## 2020-05-01 LAB — BASIC METABOLIC PANEL
Anion gap: 5 (ref 5–15)
BUN: 6 mg/dL (ref 6–20)
CO2: 23 mmol/L (ref 22–32)
Calcium: 7.1 mg/dL — ABNORMAL LOW (ref 8.9–10.3)
Chloride: 103 mmol/L (ref 98–111)
Creatinine, Ser: 0.42 mg/dL — ABNORMAL LOW (ref 0.44–1.00)
GFR calc Af Amer: >60 mL/min (ref >60)
GFR calc non Af Amer: >60 mL/min (ref >60)
Glucose, Bld: 129 mg/dL — ABNORMAL HIGH (ref 70–99)
Potassium: 3.9 mmol/L (ref 3.5–5.1)
Sodium: 131 mmol/L — ABNORMAL LOW (ref 135–145)

## 2020-05-01 LAB — LACTIC ACID, PLASMA: Lactic Acid, Venous: 1 mmol/L (ref 0.5–1.9)

## 2020-05-01 LAB — MAGNESIUM: Magnesium: 1.7 mg/dL (ref 1.7–2.4)

## 2020-05-01 LAB — CORTISOL: Cortisol, Plasma: 13.6 ug/dL

## 2020-05-01 MED ORDER — METRONIDAZOLE 500 MG PO TABS: 500.0000 mg | ORAL_TABLET | Freq: Three times a day (TID) | ORAL | 0 refills | Status: AC

## 2020-05-01 MED ORDER — CEPHALEXIN 500 MG PO CAPS: 500.0000 mg | ORAL_CAPSULE | Freq: Two times a day (BID) | ORAL | 0 refills | Status: AC

## 2020-05-01 MED ORDER — MAGNESIUM SULFATE 2 GM/50ML IV SOLN
2.0000 g | Freq: Once | INTRAVENOUS | Status: AC
  Administered 2020-05-01: 2 g via INTRAVENOUS
  Filled 2020-05-01: qty 50

## 2020-05-01 MED ORDER — ONDANSETRON 4 MG PO TBDP: 4.0000 mg | ORAL_TABLET | Freq: Three times a day (TID) | ORAL | Status: DC | PRN

## 2020-05-01 MED ORDER — CEPHALEXIN 500 MG PO CAPS
500.0000 mg | ORAL_CAPSULE | Freq: Two times a day (BID) | ORAL | Status: DC
  Administered 2020-05-01: 500 mg via ORAL
  Filled 2020-05-01: qty 1

## 2020-05-01 MED ORDER — ONDANSETRON 4 MG PO TBDP: 4.0000 mg | ORAL_TABLET | Freq: Three times a day (TID) | ORAL | 0 refills | Status: DC | PRN

## 2020-05-01 MED ORDER — METRONIDAZOLE 500 MG PO TABS
500.0000 mg | ORAL_TABLET | Freq: Three times a day (TID) | ORAL | Status: DC
  Administered 2020-05-01: 500 mg via ORAL
  Filled 2020-05-01: qty 1

## 2020-05-01 NOTE — Discharge Summary (Signed)
Discharge Summary  [name] SAY:[id number] DOB: 05/17/1981  PCP: [name], MD  Admit date: 04/27/2020 Discharge date: 05/01/2020  Time spent: 74mins   Recommendations for Outpatient Follow-up:  1. F/u with PCP within a week  for hospital discharge follow up, repeat cbc/bmp at follow up 2. Follow-up with [facility] Dr. Michail Sermon  Discharge Diagnoses:  Active Hospital Problems   Diagnosis Date Noted  . Intractable nausea and vomiting 04/28/2020  . Failure to thrive in adult 04/27/2020  . Hyponatremia 04/25/2019  . Normocytic anemia 04/07/2019  . Acute lower UTI 04/07/2019  . Chronic intestinal pseudo-obstruction 09/05/2018    Resolved Hospital Problems  No resolved problems to display.    Discharge Condition: stable  Diet recommendation: Regular diet  Filed Weights   04/27/20 1930 04/28/20 1450  Weight: 38.6 kg 38.6 kg    History of present illness: (Per admitting MD Dr. Starla Link) PCP: [name], MD   Patient coming from: Home  I have personally briefly reviewed patient's old medical records in [place]  Chief Complaint: Abdominal pain, nausea and vomiting  HPI: Stacy [name] is a 39 y.o. female with medical history significant of intestinal pseudoobstruction, GERD, UTI recently treated with antibiotics presented with intractable nausea and vomiting.  Patient presented to [place] ED on 04/26/2020 with worsening abdominal pain, nausea and vomiting.  Patient states that her symptoms have progressively gotten worse for the last 7 days.  She states that he ate a hamburger last week and and afterwards became nauseated and started vomiting.  She also had low-grade temperatures for which she was evaluated in urgent care and was follow-up follow-up recommendations ileus her Covid testing was negative.  She was given IV fluids at urgent care and then discharged.  Patient continued to have intermittent nausea with vomiting along with  abdominal pain and her symptoms started getting worse.  He was unable to tolerate any oral intake.  She was taking Zofran with minimal relief.  She was also having intermittent diarrhea without blood or mucus.  She took Pepto-Bismol and her diarrhea improved.  She felt lightheaded.  She denies any chest pain, shortness of air, loss of consciousness, seizures, cough, shortness of breath.  ED Course: He was treated with IV fluids and antiemetics.  Abdominal x-ray showed partial small bowel obstruction versus ileus.  CT of the abdomen pelvis with contrast showed diffuse mild dilation of small bowel with associated air-fluid levels, unchanged since prior CT on 04/24/2010 with diffuse mild thickening of colon secondary to infectious or inflammatory enterocolitis.  Her symptoms did not improve.  She was subsequently transferred to [facility] for hospitalist evaluation.  Hospital Course:  Active Problems:   Chronic intestinal pseudo-obstruction   Acute lower UTI   Normocytic anemia   Hyponatremia   Failure to thrive in adult   Intractable nausea and vomiting   Intractable nausea and vomiting/abdominal pain/Dehydration -History of chronic intestinal pseudoobstruction -Patient has had intractable pain with nausea and vomiting. CT of the abdomen and pelvis showed diffuse mild dilation of small bowel with associated air-fluid levels, unchanged since prior CT on 04/24/2010 with diffuse mild thickening of colon secondary to infectious or inflammatory enterocolitis -C. difficile negative, GI PCR panel negative -She required scheduled around-the-clock  Iv Zofran for several days initially, she is treated with Rocephin and Flagyl IV, her symptom has greatly improved,  Scheduled  Zofran changed to as needed base,  she tolerated diet advancement ,she did not require  much prn zofran today.  -Case discussed with GI Dr Penelope Coop  who cleared patient to discharge home with close GI follow-up and recommend continue  empiric antibiotic for a week.  -She is discharged on Keflex and Flagyl, as needed Zofran , she is going to see Dr. Michail Sermon next week   hyponatremia/hypokalemia/hypomagnesemia -She received hydration, potassium magnesium supplement, has improved -Encourage oral intake , repeat lab at hospital discharge follow-up  Hypotension -Likely from dehydration, also concerning for colitis and possible UTI -Blood culture no growth -She received hydration, continue IV antibiotics -a.m. cortisol level unremarkable -Hypotension resolved, she is normotensive at discharge, she ambulated in the hallway without difficulty prior to discharge.   Acute on chronic normocytic anemia -Hemoglobin globin on presentation 6.2, s/p 1 units PRBC transfusion, hemoglobin 8.4 this morning -Iron study was ordered but result was incomplete for unclear reason, now she is post transfusion will not repeat iron panel -B12 folate unremarkable, retic count was not ordered prior to blood transfusion - total bilirubin within normal limits, less likely hemolysis -No overt sign of bleeding, occult blood test negative -repeat cbc at hospital discharge follow up, recommend check retic count and iron study if not recently done at pcp 's office.  -ms Indelicato is continue to get b12 injection from pcp  Possible UTI -Urine culture with multiple species -She is already on antibiotics  Underweight, hypoalbuminemia Body mass index is 15.06 kg/m. Nutrition supplement, f/u with pcp and GI   DVT prophylaxis: SCDs Start: 04/28/20 1543  Code Status:full Family Communication: Husband at bedside Disposition:    Dispo: The patient is from: home  Anticipated d/c is to: home    Consultants:   [facility]  Procedures:   none  Antimicrobials:   rocephin and Flagyl   Discharge Exam: BP 114/76   Pulse 90   Temp 98 F (36.7 C) (Oral)   Resp 16   Ht 5\' 3"  (1.6 m)   Wt 38.6 kg   SpO2 96%    BMI 15.06 kg/m   General: * Cardiovascular: * Respiratory: *  Discharge Instructions You were cared for by a hospitalist during your hospital stay. If you have any questions about your discharge medications or the care you received while you were in the hospital after you are discharged, you can call the unit and asked to speak with the hospitalist on call if the hospitalist that took care of you is not available. Once you are discharged, your primary care physician will handle any further medical issues. Please note that NO REFILLS for any discharge medications will be authorized once you are discharged, as it is imperative that you return to your primary care physician (or establish a relationship with a primary care physician if you do not have one) for your aftercare needs so that they can reassess your need for medications and monitor your lab values.  Discharge Instructions    Diet general   Complete by: As directed    Increase activity slowly   Complete by: As directed      Allergies as of 05/01/2020      Reactions   Tetracyclines & Related Swelling   Swelling in spine; required spinal tap.   Erythromycin Other (See Comments)   Swelling in spine; required spinal tap   Raspberry Rash   Sulfonamide Derivatives Rash   Reaction to cream      Medication List    STOP taking these medications   guaiFENesin-codeine 100-10 MG/5ML syrup Commonly known as: ROBITUSSIN AC  ondansetron 4 MG tablet Commonly known as: ZOFRAN   sucralfate 1 g tablet Commonly known as: Carafate   triamcinolone ointment 0.1 % Commonly known as: KENALOG     TAKE these medications   acetaminophen 500 MG tablet Commonly known as: TYLENOL Take 500 mg by mouth every 6 (six) hours as needed for headache (pain).   ALIGN PO Take 1 capsule by mouth daily.   BEANO PO Take 1 tablet by mouth 3 (three) times daily with meals.   cephALEXin 500 MG capsule Commonly known as: KEFLEX Take 1 capsule (500  mg total) by mouth every 12 (twelve) hours for 6 days. What changed: when to take this   cholecalciferol 25 MCG (1000 UNIT) tablet Commonly known as: VITAMIN D3 TAKE 2 TABLETS (2,000 UNITS TOTAL) BY MOUTH DAILY. What changed: See the new instructions.   cyanocobalamin 1000 MCG/ML injection Commonly known as: (VITAMIN B-12) Inject 1,000 mcg into the muscle every 30 (thirty) days.   famotidine 20 MG tablet Commonly known as: PEPCID TAKE 1 TABLET BY MOUTH EVERYDAY AT BEDTIME What changed: See the new instructions.   ibuprofen 200 MG tablet Commonly known as: ADVIL Take 400-600 mg by mouth every 6 (six) hours as needed for headache (pain).   metroNIDAZOLE 500 MG tablet Commonly known as: FLAGYL Take 1 tablet (500 mg total) by mouth every 8 (eight) hours for 6 days.   montelukast 10 MG tablet Commonly known as: SINGULAIR Take 10 mg by mouth daily.   Motegrity 2 MG Tabs Generic drug: Prucalopride Succinate Take 2 mg by mouth daily.   omeprazole 40 MG capsule Commonly known as: PRILOSEC TAKE 1 CAPSULE BY MOUTH EVERY DAY What changed: how much to take   ondansetron 4 MG disintegrating tablet Commonly known as: Zofran ODT Take 1 tablet (4 mg total) by mouth every 8 (eight) hours as needed for nausea or vomiting.   Poly-Iron 150 150 MG capsule Generic drug: iron polysaccharides TAKE 1 CAPSULE BY MOUTH TWICE A DAY What changed: how much to take   polyethylene glycol 17 g packet Commonly known as: MIRALAX / GLYCOLAX Take 17 g by mouth 2 (two) times daily.      Allergies  Allergen Reactions  . Tetracyclines & Related Swelling    Swelling in spine; required spinal tap.   . Erythromycin Other (See Comments)    Swelling in spine; required spinal tap  . Raspberry Rash  . Sulfonamide Derivatives Rash    Reaction to cream    Follow-up Information    [name], MD Follow up in 1 week(s).   Specialty: Family Medicine Why: hospital discharge follow up, repeat  cbc/bmp Contact information: [address] [place] [address] [phone]        Wilford Corner, MD Follow up in 1 week(s).   Specialty: Gastroenterology Why: as scheduled  Contact information: [address] [place] [address] [phone]                The results of significant diagnostics from this hospitalization (including imaging, microbiology, ancillary and laboratory) are listed below for reference.    Significant Diagnostic Studies: CT ABDOMEN PELVIS W CONTRAST  Result Date: 04/26/2020 CLINICAL DATA:  Abdominal pain.  Bowel obstruction suspected EXAM: CT ABDOMEN AND PELVIS WITH CONTRAST TECHNIQUE: Multidetector CT imaging of the abdomen and pelvis was performed using the standard protocol following bolus administration of intravenous contrast. CONTRAST:  54mL OMNIPAQUE IOHEXOL 300 MG/ML  SOLN COMPARISON:  04/25/2019  FINDINGS: Lower chest: There are trace bilateral pleural effusions. The heart size is unremarkable. There is atelectasis at the left lung base. Hepatobiliary: Again noted is intrahepatic and extrahepatic biliary ductal dilatation which is similar in appearance to prior study. Multiple liver masses are noted, specifically within the right hepatic lobe and the caudate lobe. The gallbladder is contracted and therefore poorly evaluated.There is no biliary ductal dilation. Pancreas: Normal contours without ductal dilatation. No peripancreatic fluid collection. Spleen: Unremarkable. Adrenals/Urinary Tract: --Adrenal glands: Unremarkable. --Right kidney/ureter: No hydronephrosis or radiopaque kidney stones. --Left kidney/ureter: No hydronephrosis or radiopaque kidney stones. --Urinary bladder: Unremarkable. Stomach/Bowel: --Stomach/Duodenum: There is an air-fluid level in the stomach. --Small bowel: The there is diffuse mild dilatation of virtually all of the small bowel loops. Many of the small bowel loops contain air-fluid levels there is  relative decompression of the terminal ileum. --Colon: There is mild diffuse wall thickening of the colon with apparent loss of haustral. --Appendix: Normal. Vascular/Lymphatic: Normal course and caliber of the major abdominal vessels. --No retroperitoneal lymphadenopathy. --there are mildly enlarged mesenteric lymph nodes, especially in the right lower quadrant. --No pelvic or inguinal lymphadenopathy. Reproductive: Bilateral Essure devices are noted. Other: No ascites or free air. The abdominal wall is normal. Musculoskeletal. No acute displaced fractures. IMPRESSION: 1. Diffuse mild dilatation of virtually all of the small bowel with associated air-fluid levels. This appearance is very similar to the patient's CT dated 04/25/2019. There is no clear transition point. Additionally, there is mild diffuse wall thickening of the colon. Findings may be secondary to infectious or inflammatory enterocolitis. 2. Essentially unchanged intrahepatic biliary ductal dilatation. Correlation with laboratory studies is recommended. 3. Trace bilateral pleural effusions. Electronically Signed   By: Constance Holster M.D.   On: 04/26/2020 20:49   DG Abdomen Acute W/Chest  Result Date: 04/26/2020 CLINICAL DATA:  One-week history of nausea, diarrhea and anorexia. Negative COVID-19 tests recently. EXAM: ACUTE ABDOMEN SERIES (ABDOMEN TWO VIEWS AND CHEST ONE VIEW) COMPARISON:  Acute abdomen series 04/22/2019 and CT abdomen and pelvis 04/25/2019. FINDINGS: Gaseous distention of numerous loops of small bowel throughout the abdomen and pelvis demonstrating air-fluid levels on the ERECT image. Gas and liquid stool in normal caliber colon. No evidence of free intraperitoneal air on the [employer]. Essure devices in the fallopian tubes in the pelvis. No visible opaque urinary tract calculi. Regional skeleton unremarkable. Cardiomediastinal silhouette unremarkable and unchanged. Lungs clear. Bronchovascular markings normal. Pulmonary  vascularity normal. No visible pleural effusions. No pneumothorax. IMPRESSION: 1. Partial small bowel obstruction is favored over small bowel ileus. 2. No free intraperitoneal air. 3. No acute cardiopulmonary disease. Electronically Signed   By: Evangeline Dakin M.D.   On: 04/26/2020 20:11    Microbiology: Recent Results (from the past 240 hour(s))  SARS Coronavirus 2 by RT PCR (hospital order, performed in [facility] hospital lab) Nasopharyngeal Nasopharyngeal Swab     Status: None   Collection Time: 04/26/20  7:41 PM   Specimen: Nasopharyngeal Swab  Result Value Ref Range Status   SARS Coronavirus 2 NEGATIVE NEGATIVE Final    Comment: (NOTE) SARS-CoV-2 target nucleic acids are NOT DETECTED.  The SARS-CoV-2 RNA is generally detectable in upper and lower respiratory specimens during the acute phase of infection. The lowest concentration of SARS-CoV-2 viral copies this assay can detect is 250 copies / mL. A negative result does not preclude SARS-CoV-2 infection and should not be used as the sole basis for treatment or other patient management decisions.  A negative result may  occur with improper specimen collection / handling, submission of specimen other than nasopharyngeal swab, presence of viral mutation(s) within the areas targeted by this assay, and inadequate number of viral copies (<250 copies / mL). A negative result must be combined with clinical observations, patient history, and epidemiological information.  Fact Sheet for Patients:   StrictlyIdeas.no  Fact Sheet for Healthcare Providers: BankingDealers.co.za  This test is not yet approved or  cleared by the [place] FDA and has been authorized for detection and/or diagnosis of SARS-CoV-2 by FDA under an Emergency Use Authorization (EUA).  This EUA will remain in effect (meaning this test can be used) for the duration of the COVID-19 declaration under Section 564(b)(1)  of the Act, 21 U.S.C. section 360bbb-3(b)(1), unless the authorization is terminated or revoked sooner.  Performed at [facility], [address] [place], [place] [address]   SARS Coronavirus 2 by RT PCR (hospital order, performed in [facility] hospital lab) Nasopharyngeal Nasopharyngeal Swab     Status: None   Collection Time: 04/27/20  9:11 PM   Specimen: Nasopharyngeal Swab  Result Value Ref Range Status   SARS Coronavirus 2 NEGATIVE NEGATIVE Final    Comment: (NOTE) SARS-CoV-2 target nucleic acids are NOT DETECTED.  The SARS-CoV-2 RNA is generally detectable in upper and lower respiratory specimens during the acute phase of infection. The lowest concentration of SARS-CoV-2 viral copies this assay can detect is 250 copies / mL. A negative result does not preclude SARS-CoV-2 infection and should not be used as the sole basis for treatment or other patient management decisions.  A negative result may occur with improper specimen collection / handling, submission of specimen other than nasopharyngeal swab, presence of viral mutation(s) within the areas targeted by this assay, and inadequate number of viral copies (<250 copies / mL). A negative result must be combined with clinical observations, patient history, and epidemiological information.  Fact Sheet for Patients:   StrictlyIdeas.no  Fact Sheet for Healthcare Providers: BankingDealers.co.za  This test is not yet approved or  cleared by the [place] FDA and has been authorized for detection and/or diagnosis of SARS-CoV-2 by FDA under an Emergency Use Authorization (EUA).  This EUA will remain in effect (meaning this test can be used) for the duration of the COVID-19 declaration under Section 564(b)(1) of the Act, 21 U.S.C. section 360bbb-3(b)(1), unless the authorization is terminated or revoked sooner.  Performed at [facility], [address], [place], [place] [address]   Urine culture     Status: Abnormal   Collection Time: 04/28/20  3:45 PM   Specimen: Urine, Random  Result Value Ref Range Status   Specimen Description   Final    URINE, RANDOM Performed at [facility] [place], [place] [address], [place], [place] [address]    Special Requests   Final    NONE Performed at [facility], [place] [address] [place], [place] [address]    Culture MULTIPLE SPECIES PRESENT, SUGGEST RECOLLECTION (A)  Final   Report Status 04/30/2020 FINAL  Final  Gastrointestinal Panel by PCR , Stool     Status: None   Collection Time: 04/28/20  3:47 PM   Specimen: Stool  Result Value Ref Range Status   Campylobacter species NOT DETECTED NOT DETECTED Final   Plesimonas shigelloides NOT DETECTED NOT DETECTED Final   Salmonella species NOT DETECTED NOT DETECTED Final   Yersinia enterocolitica NOT DETECTED NOT DETECTED Final   Vibrio species  NOT DETECTED NOT DETECTED Final   Vibrio cholerae NOT DETECTED NOT DETECTED Final   Enteroaggregative E coli (EAEC) NOT DETECTED NOT DETECTED Final   Enteropathogenic E coli (EPEC) NOT DETECTED NOT DETECTED Final   Enterotoxigenic E coli (ETEC) NOT DETECTED NOT DETECTED Final   Shiga like toxin producing E coli (STEC) NOT DETECTED NOT DETECTED Final   Shigella/Enteroinvasive E coli (EIEC) NOT DETECTED NOT DETECTED Final   Cryptosporidium NOT DETECTED NOT DETECTED Final   Cyclospora cayetanensis NOT DETECTED NOT DETECTED Final   Entamoeba histolytica NOT DETECTED NOT DETECTED Final   Giardia lamblia NOT DETECTED NOT DETECTED Final   Adenovirus F40/41 NOT DETECTED NOT DETECTED Final   Astrovirus NOT DETECTED NOT DETECTED Final   Norovirus GI/GII NOT DETECTED NOT DETECTED Final   Rotavirus A NOT DETECTED NOT DETECTED Final   Sapovirus (I, II, IV, and V) NOT DETECTED NOT DETECTED Final    Comment: Performed at [facility], [address] [place], [place] [address]    Culture, blood (routine x 2)     Status: None (Preliminary result)   Collection Time: 04/28/20  4:30 PM   Specimen: BLOOD  Result Value Ref Range Status   Specimen Description   Final    BLOOD LEFT ANTECUBITAL Performed at [facility] [address] [place], [place] [address]    Special Requests   Final    BOTTLES DRAWN AEROBIC AND ANAEROBIC Blood Culture results may not be optimal due to an inadequate volume of blood received in culture bottles Performed at [place] [address], [place], [place] [address]    Culture   Final    NO GROWTH 3 DAYS Performed at [facility], [place] [address], [place], [place] [address]    Report Status PENDING  Incomplete  Culture, blood (routine x 2)     Status: None (Preliminary result)   Collection Time: 04/28/20  4:30 PM   Specimen: BLOOD LEFT FOREARM  Result Value Ref Range Status   Specimen Description   Final    BLOOD LEFT FOREARM Performed at [facility] [address] [place], [place] [address]    Special Requests   Final    BOTTLES DRAWN AEROBIC AND ANAEROBIC Blood Culture adequate volume Performed at [facility] [address], [place], [place] [address]    Culture   Final    NO GROWTH 3 DAYS Performed at [facility], [place] [address] [place], [place] [address]    Report Status PENDING  Incomplete  MRSA PCR Screening     Status: None   Collection Time: 04/29/20  3:15 AM   Specimen: Nasal Mucosa; Nasopharyngeal  Result Value Ref Range Status   MRSA by PCR NEGATIVE NEGATIVE Final    Comment:        The GeneXpert MRSA Assay (FDA approved for NASAL specimens only), is one component of a comprehensive MRSA colonization surveillance program. It is not intended to diagnose MRSA infection nor to guide or monitor treatment for MRSA infections. Performed at [facility], [place] [address], [place],  [address]   C Difficile  Quick Screen w PCR reflex     Status: None   Collection Time: 04/29/20  7:30 PM   Specimen: STOOL  Result Value Ref Range Status   C Diff antigen NEGATIVE NEGATIVE Final   C Diff toxin NEGATIVE NEGATIVE Final   C Diff interpretation No C. difficile detected.  Final    Comment: Performed at [facility], [place] [address] [place], [place] [address]     Labs: Basic Metabolic Panel: Recent Labs  Lab 04/26/20 1651 04/27/20 2111 04/29/20 0315 04/30/20 0237 05/01/20 0601  NA 133* 132* 132* 132* 131*  K 3.8 3.6 3.2* 3.4* 3.9  CL 102 101 106 99 103  CO2 21* 22 19* 23 23  GLUCOSE 92 91 115* 93 129*  BUN 21* 19 15 7 6   CREATININE 0.48 0.39* 0.34* <0.30* 0.42*  CALCIUM 7.9* 7.3* 7.1* 6.9* 7.1*  MG  --   --  1.3* 1.7 1.7   Liver Function Tests: Recent Labs  Lab 04/26/20 1651 04/27/20 2111 04/28/20 0130 04/29/20 0315  AST 13* 10* 10* 9*  ALT 15 12 12 12   ALKPHOS 100 77 69 64  BILITOT 1.0 1.6* 1.2 0.6  PROT 5.6* 4.3* 3.9* 3.8*  ALBUMIN 2.1* 1.6* 1.4* 1.5*   Recent Labs  Lab 04/26/20 1651 04/29/20 0315  LIPASE 17 21   No results for input(s): AMMONIA in the last 168 hours. CBC: Recent Labs  Lab 04/26/20 1651 04/26/20 1651 04/27/20 2111 04/29/20 0315 04/29/20 1249 04/30/20 0237 05/01/20 0601  WBC 9.4  --  9.0 6.8  --  6.4 6.8  NEUTROABS  --   --  6.0  --   --  4.0 3.7  HGB 10.1*   < > 8.2* 6.2* 8.2* 8.4* 9.2*  HCT 30.5*   < > 24.5* 19.8* 25.8* 26.5* 27.7*  MCV 84.7  --  84.5 89.6  --  88.9 88.5  PLT 478*  --  375 322  --  247 301   < > = values in this interval not displayed.   Cardiac Enzymes: No results for input(s): CKTOTAL, CKMB, CKMBINDEX, TROPONINI in the last 168 hours. BNP: BNP (last 3 results) No results for input(s): BNP in the last 8760 hours.  ProBNP (last 3 results) No results for input(s): PROBNP in the last 8760 hours.  CBG: No results for input(s): GLUCAP in the last 168 hours.     Signed:  Florencia Reasons MD, PhD,  FACP  [facility] 05/01/2020, 2:25 PM

## 2020-05-01 NOTE — Plan of Care (Signed)
Pt ready to DC home with husband 

## 2020-05-03 LAB — CULTURE, BLOOD (ROUTINE X 2)
Culture: NO GROWTH
Culture: NO GROWTH
Special Requests: ADEQUATE

## 2020-05-07 ENCOUNTER — Ambulatory Visit: Payer: 59

## 2020-05-08 ENCOUNTER — Encounter: Payer: Self-pay | Admitting: Family Medicine

## 2020-05-08 ENCOUNTER — Telehealth (INDEPENDENT_AMBULATORY_CARE_PROVIDER_SITE_OTHER): Payer: 59 | Admitting: Family Medicine

## 2020-05-08 ENCOUNTER — Other Ambulatory Visit: Payer: Self-pay

## 2020-05-08 VITALS — BP 87/61 | HR 108 | Ht 63.0 in | Wt 94.0 lb

## 2020-05-08 DIAGNOSIS — R112 Nausea with vomiting, unspecified: Secondary | ICD-10-CM | POA: Diagnosis not present

## 2020-05-08 DIAGNOSIS — R627 Adult failure to thrive: Secondary | ICD-10-CM | POA: Diagnosis not present

## 2020-05-08 DIAGNOSIS — D75839 Thrombocytosis, unspecified: Secondary | ICD-10-CM

## 2020-05-08 DIAGNOSIS — D473 Essential (hemorrhagic) thrombocythemia: Secondary | ICD-10-CM | POA: Diagnosis not present

## 2020-05-08 DIAGNOSIS — D649 Anemia, unspecified: Secondary | ICD-10-CM | POA: Diagnosis not present

## 2020-05-08 NOTE — Progress Notes (Signed)
Virtual Visit via Video   I connected with patient on 05/08/20 at 11:00 AM EDT by a video enabled telemedicine application and verified that I am speaking with the correct person using two identifiers.  Location patient: Home Location provider: Acupuncturist, Office Persons participating in the virtual visit: Patient, Provider, [place] (Jess B)  I discussed the limitations of evaluation and management by telemedicine and the availability of in person appointments. The patient expressed understanding and agreed to proceed.  Subjective:   HPI:   Hospital f/u- pt was admitted on 9/12-16 for intractable N/V/abdominal pain/dehydration.  Pt required transfusion b/c hgb dropped to 6.2.  Heme occult was (-) and she is not having menstrual cycles.  She was treated w/ IV fluids, antiemetics.  Xray showed partial small bowel obstruction vs ileus.  CT showed diffuse mild dilatation of small bowel w/ associated air-fluid levels air-fluid levels but this was unchanged on CT from 2011 and diffuse mild thickening of colon.  C Diff negative, GI panel negative.  She was dc'd w/ Keflex and Flagyl and GI f/u scheduled.  Currently no nausea or vomiting.  Is able to eat and drinking fluids.  BP has been running low since d/c.  Typical BPs run 80-90s/60s.  Denies dizziness today.  Yesterday did not feel well.  Saw Dr Michail Sermon yesterday and then plan is to establish care at [facility] rather than [place].  Continues to use the Ondansetron as needed.  Pt reports abdominal pain has resolved.  Having regular, 'normal' BMs.    Reviewed Dr Kathline Magic note from yesterday and in addition to her anemia, her platelets are now 788.  Reviewed past medical, surgical, family and social histories. Reviewed d/c summary, labs, imaging, and Dr Kathline Magic note from yesterday.  ROS:   See pertinent positives and negatives per HPI.  Patient Active Problem List   Diagnosis Date Noted  . Intractable nausea and vomiting 04/28/2020    . Failure to thrive in adult 04/27/2020  . Physical exam 01/01/2020  . Abdominal pain with vomiting 04/25/2019  . Hyponatremia 04/25/2019  . GERD (gastroesophageal reflux disease) 04/07/2019  . Acute lower UTI 04/07/2019  . Normocytic anemia 04/07/2019  . 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 Iron deficiency anemia 01/22/2018  . Vitamin B deficiency 01/22/2018  . Vitamin D deficiency 01/22/2018  . PCP NOTES >>>>>>>>>>>>>>>>>>>>>>>>>>>>> 06/28/2017  . Underweight 06/27/2017  . Cyst of ovary 10/05/2016  . Orthostatic hypotension 07/27/2013  . Hepatic adenoma 07/27/2013    Social History   Tobacco Use  . Smoking status: Never Smoker  . Smokeless tobacco: Never Used  Substance Use Topics  . Alcohol use: No    Comment: socially     Current Outpatient Medications:  .  acetaminophen (TYLENOL) 500 MG tablet, Take 500 mg by mouth every 6 (six) hours as needed for headache (pain)., Disp: , Rfl:  .  Alpha-D-Galactosidase (BEANO PO), Take 1 tablet by mouth 3 (three) times daily with meals., Disp: , Rfl:  .  cholecalciferol (VITAMIN D3) 25 MCG (1000 UNIT) tablet, TAKE 2 TABLETS (2,000 UNITS TOTAL) BY MOUTH DAILY. (Patient taking differently: Take 1,000 Units by mouth 2 (two) times daily. ), Disp: 200 tablet, Rfl: 3 .  cyanocobalamin (,VITAMIN B-12,) 1000 MCG/ML injection, Inject 1,000 mcg into the muscle every 30 (thirty) days., Disp: , Rfl:  .  famotidine (PEPCID) 20 MG tablet, TAKE 1 TABLET BY MOUTH EVERYDAY AT BEDTIME (Patient taking differently: Take 20 mg by mouth at bedtime. ), Disp: 30 tablet, Rfl: 6 .  ibuprofen (ADVIL) 200 MG tablet, Take 400-600 mg by mouth every 6 (six) hours as needed for headache (pain)., Disp: , Rfl:  .  montelukast (SINGULAIR) 10 MG tablet, Take 10 mg by mouth daily., Disp: , Rfl:  .  omeprazole (PRILOSEC) 40 MG capsule, TAKE 1 CAPSULE BY MOUTH EVERY DAY (Patient taking differently: Take 40 mg by mouth daily. ), Disp: 90 capsule, Rfl: 1 .   ondansetron (ZOFRAN ODT) 4 MG disintegrating tablet, Take 1 tablet (4 mg total) by mouth every 8 (eight) hours as needed for nausea or vomiting., Disp: 30 tablet, Rfl: 0 .  POLY-IRON 150 150 MG capsule, TAKE 1 CAPSULE BY MOUTH TWICE A DAY (Patient taking differently: Take 150 mg by mouth 2 (two) times daily. ), Disp: 60 capsule, Rfl: 11 .  polyethylene glycol (MIRALAX / GLYCOLAX) 17 g packet, Take 17 g by mouth 2 (two) times daily., Disp: , Rfl:  .  polyethylene glycol-electrolytes (NULYTELY) 420 g solution, Take 4,000 mLs by mouth as directed., Disp: , Rfl:  .  Probiotic Product (ALIGN PO), Take 1 capsule by mouth daily., Disp: , Rfl:  .  Prucalopride Succinate (MOTEGRITY) 2 MG TABS, Take 2 mg by mouth daily., Disp: , Rfl:   Allergies  Allergen Reactions  . Tetracyclines & Related Swelling    Swelling in spine; required spinal tap.   . Erythromycin Other (See Comments)    Swelling in spine; required spinal tap  . Raspberry Rash  . Sulfonamide Derivatives Rash    Reaction to cream    Objective:   BP (!) 87/61   Pulse (!) 108   Ht 5\' 3"  (1.6 m)   Wt 94 lb (42.6 kg)   BMI 16.65 kg/m  AAOx3, NAD Cachectic NCAT, EOMI No obvious CN deficits Pale Pt is able to speak clearly, coherently without shortness of breath or increased work of breathing.  Thought process is linear.  Mood is appropriate.   Assessment and Plan:   Intractable N/V- currently resolved.  Using Zofran as needed.  Currently able to eat and drink and abdominal pain has resolved.  Has referral to [facility] per Dr Michail Sermon. Will follow along.  Anemia- no obvious reason for Hgb of 6.2.  He required transfusion. Heme occult was negative.  Will refer to hematology for complete evaluation.  Thrombocytosis- new.  Pt's platelets yesterday were 788 but were normal (301) at time of d/c.  Will refer to Hematology for evaluation.  Failure to thrive- ongoing issue for pt due to inability to eat regularly due to ongoing  abdominal issues.  We have discussed protein shakes and even talked about the possibility of TPN or other parenteral nutrition.  Hopefully [facility] workup will provide some answers.  Will follow.   [name], MD 05/08/2020

## 2020-05-08 NOTE — Progress Notes (Signed)
I have discussed the procedure for the virtual visit with the patient who has given consent to proceed with assessment and treatment.   Emery Dupuy L Lulla Linville, CMA     

## 2020-05-09 ENCOUNTER — Telehealth: Payer: Self-pay | Admitting: Hematology and Oncology

## 2020-05-09 NOTE — Telephone Encounter (Signed)
Received a new hem referral from Dr. Birdie Riddle for thrombocytosis and normocytic anemia. Stacy [name] has been cld and scheduled to see Dr. Alvy Bimler on 10/12 at 1pm. Pt aware to arrive 30 minutes early.

## 2020-05-10 ENCOUNTER — Other Ambulatory Visit: Payer: Self-pay | Admitting: Family Medicine

## 2020-05-13 ENCOUNTER — Telehealth: Payer: Self-pay | Admitting: Family Medicine

## 2020-05-13 NOTE — Telephone Encounter (Signed)
Patient called and stated that she is trying to get her Motegrity from her pharmacy - and pharmacy told patient that it would need approval from dr. Birdie Riddle -  Pharmacy - [facility] in [place]

## 2020-05-13 NOTE — Telephone Encounter (Signed)
Please advise, you have never filled this medication.

## 2020-05-14 MED ORDER — MOTEGRITY 2 MG PO TABS
2.0000 mg | ORAL_TABLET | Freq: Every day | ORAL | 1 refills | Status: DC
Start: 2020-05-14 — End: 2020-09-08

## 2020-05-14 NOTE — Telephone Encounter (Signed)
Prescription came back as needing a PA. Per PCP fax was sent to patient GI provider for them to determine the best course of action.

## 2020-05-14 NOTE — Telephone Encounter (Signed)
Ok to refill.  1 tab daily (2mg ), #90, 1 refill

## 2020-05-14 NOTE — Telephone Encounter (Signed)
Medication filled to pharmacy as requested.   

## 2020-05-16 ENCOUNTER — Other Ambulatory Visit: Payer: Self-pay

## 2020-05-16 ENCOUNTER — Inpatient Hospital Stay (HOSPITAL_COMMUNITY)
Admission: EM | Admit: 2020-05-16 | Discharge: 2020-05-28 | DRG: 640 | Disposition: A | Discharge status: Home Health | Payer: 59 | Attending: Internal Medicine | Admitting: Internal Medicine

## 2020-05-16 ENCOUNTER — Observation Stay (HOSPITAL_COMMUNITY): Payer: 59

## 2020-05-16 ENCOUNTER — Encounter (HOSPITAL_COMMUNITY): Payer: Self-pay | Admitting: Emergency Medicine

## 2020-05-16 DIAGNOSIS — K5989 Other specified functional intestinal disorders: Secondary | ICD-10-CM | POA: Diagnosis present

## 2020-05-16 DIAGNOSIS — E860 Dehydration: Secondary | ICD-10-CM | POA: Diagnosis present

## 2020-05-16 DIAGNOSIS — N390 Urinary tract infection, site not specified: Secondary | ICD-10-CM | POA: Diagnosis present

## 2020-05-16 DIAGNOSIS — Z833 Family history of diabetes mellitus: Secondary | ICD-10-CM | POA: Diagnosis not present

## 2020-05-16 DIAGNOSIS — R633 Feeding difficulties, unspecified: Secondary | ICD-10-CM | POA: Diagnosis not present

## 2020-05-16 DIAGNOSIS — R636 Underweight: Secondary | ICD-10-CM | POA: Diagnosis present

## 2020-05-16 DIAGNOSIS — F419 Anxiety disorder, unspecified: Secondary | ICD-10-CM | POA: Diagnosis present

## 2020-05-16 DIAGNOSIS — D500 Iron deficiency anemia secondary to blood loss (chronic): Principal | ICD-10-CM

## 2020-05-16 DIAGNOSIS — K219 Gastro-esophageal reflux disease without esophagitis: Secondary | ICD-10-CM | POA: Diagnosis present

## 2020-05-16 DIAGNOSIS — Z8249 Family history of ischemic heart disease and other diseases of the circulatory system: Secondary | ICD-10-CM | POA: Diagnosis not present

## 2020-05-16 DIAGNOSIS — E43 Unspecified severe protein-calorie malnutrition: Secondary | ICD-10-CM

## 2020-05-16 DIAGNOSIS — R197 Diarrhea, unspecified: Secondary | ICD-10-CM

## 2020-05-16 DIAGNOSIS — R112 Nausea with vomiting, unspecified: Secondary | ICD-10-CM

## 2020-05-16 DIAGNOSIS — Z87440 Personal history of urinary (tract) infections: Secondary | ICD-10-CM | POA: Diagnosis not present

## 2020-05-16 DIAGNOSIS — I959 Hypotension, unspecified: Secondary | ICD-10-CM | POA: Diagnosis present

## 2020-05-16 DIAGNOSIS — E861 Hypovolemia: Secondary | ICD-10-CM

## 2020-05-16 DIAGNOSIS — Z20822 Contact with and (suspected) exposure to covid-19: Secondary | ICD-10-CM | POA: Diagnosis present

## 2020-05-16 DIAGNOSIS — E876 Hypokalemia: Secondary | ICD-10-CM

## 2020-05-16 DIAGNOSIS — E872 Acidosis: Secondary | ICD-10-CM | POA: Diagnosis present

## 2020-05-16 DIAGNOSIS — G8929 Other chronic pain: Secondary | ICD-10-CM | POA: Diagnosis present

## 2020-05-16 DIAGNOSIS — I951 Orthostatic hypotension: Secondary | ICD-10-CM | POA: Diagnosis present

## 2020-05-16 DIAGNOSIS — A419 Sepsis, unspecified organism: Secondary | ICD-10-CM | POA: Diagnosis present

## 2020-05-16 DIAGNOSIS — K5909 Other constipation: Secondary | ICD-10-CM | POA: Diagnosis present

## 2020-05-16 DIAGNOSIS — D509 Iron deficiency anemia, unspecified: Secondary | ICD-10-CM | POA: Diagnosis present

## 2020-05-16 DIAGNOSIS — R109 Unspecified abdominal pain: Secondary | ICD-10-CM | POA: Diagnosis present

## 2020-05-16 DIAGNOSIS — K5900 Constipation, unspecified: Secondary | ICD-10-CM | POA: Diagnosis not present

## 2020-05-16 DIAGNOSIS — D638 Anemia in other chronic diseases classified elsewhere: Secondary | ICD-10-CM | POA: Diagnosis present

## 2020-05-16 DIAGNOSIS — Z23 Encounter for immunization: Secondary | ICD-10-CM

## 2020-05-16 DIAGNOSIS — Z79899 Other long term (current) drug therapy: Secondary | ICD-10-CM

## 2020-05-16 DIAGNOSIS — I9589 Other hypotension: Secondary | ICD-10-CM | POA: Diagnosis not present

## 2020-05-16 DIAGNOSIS — Z681 Body mass index (BMI) 19 or less, adult: Secondary | ICD-10-CM | POA: Diagnosis not present

## 2020-05-16 DIAGNOSIS — R1084 Generalized abdominal pain: Secondary | ICD-10-CM | POA: Diagnosis present

## 2020-05-16 DIAGNOSIS — B9620 Unspecified Escherichia coli [E. coli] as the cause of diseases classified elsewhere: Secondary | ICD-10-CM | POA: Diagnosis present

## 2020-05-16 DIAGNOSIS — R140 Abdominal distension (gaseous): Secondary | ICD-10-CM

## 2020-05-16 DIAGNOSIS — Z888 Allergy status to other drugs, medicaments and biological substances status: Secondary | ICD-10-CM

## 2020-05-16 DIAGNOSIS — E869 Volume depletion, unspecified: Secondary | ICD-10-CM

## 2020-05-16 DIAGNOSIS — R627 Adult failure to thrive: Secondary | ICD-10-CM | POA: Diagnosis present

## 2020-05-16 DIAGNOSIS — B964 Proteus (mirabilis) (morganii) as the cause of diseases classified elsewhere: Secondary | ICD-10-CM | POA: Diagnosis present

## 2020-05-16 DIAGNOSIS — Z882 Allergy status to sulfonamides status: Secondary | ICD-10-CM

## 2020-05-16 DIAGNOSIS — K319 Disease of stomach and duodenum, unspecified: Secondary | ICD-10-CM | POA: Diagnosis present

## 2020-05-16 DIAGNOSIS — Z91018 Allergy to other foods: Secondary | ICD-10-CM

## 2020-05-16 LAB — CREATININE, SERUM
Creatinine, Ser: 0.35 mg/dL — ABNORMAL LOW (ref 0.44–1.00)
GFR calc Af Amer: >60 mL/min (ref >60)
GFR calc non Af Amer: >60 mL/min (ref >60)

## 2020-05-16 LAB — CBC WITH DIFFERENTIAL/PLATELET
Abs Immature Granulocytes: 0.46 10*3/uL — ABNORMAL HIGH (ref 0.00–0.07)
Basophils Absolute: 0.1 10*3/uL (ref 0.0–0.1)
Basophils Relative: 1 %
Eosinophils Absolute: 0 10*3/uL (ref 0.0–0.5)
Eosinophils Relative: 0 %
HCT: 26.9 % — ABNORMAL LOW (ref 36.0–46.0)
Hemoglobin: 8.7 g/dL — ABNORMAL LOW (ref 12.0–15.0)
Immature Granulocytes: 3 %
Lymphocytes Relative: 6 %
Lymphs Abs: 0.9 10*3/uL (ref 0.7–4.0)
MCH: 29.7 pg (ref 26.0–34.0)
MCHC: 32.3 g/dL (ref 30.0–36.0)
MCV: 91.8 fL (ref 80.0–100.0)
Monocytes Absolute: 0.8 10*3/uL (ref 0.1–1.0)
Monocytes Relative: 6 %
Neutro Abs: 11.9 10*3/uL — ABNORMAL HIGH (ref 1.7–7.7)
Neutrophils Relative %: 84 %
Platelets: 334 10*3/uL (ref 150–400)
RBC: 2.93 MIL/uL — ABNORMAL LOW (ref 3.87–5.11)
RDW: 16.3 % — ABNORMAL HIGH (ref 11.5–15.5)
WBC Morphology: INCREASED
WBC: 14.1 10*3/uL — ABNORMAL HIGH (ref 4.0–10.5)
nRBC: 0 % (ref 0.0–0.2)

## 2020-05-16 LAB — URINALYSIS, ROUTINE W REFLEX MICROSCOPIC
Glucose, UA: NEGATIVE mg/dL
Ketones, ur: NEGATIVE mg/dL
Nitrite: NEGATIVE
Protein, ur: 100 mg/dL — AB
Specific Gravity, Urine: 1.031 — ABNORMAL HIGH (ref 1.005–1.030)
Squamous Epithelial / HPF: >50 — ABNORMAL HIGH (ref 0–5)
WBC, UA: >50 WBC/hpf — ABNORMAL HIGH (ref 0–5)
pH: 5 (ref 5.0–8.0)

## 2020-05-16 LAB — COMPREHENSIVE METABOLIC PANEL
ALT: 11 U/L (ref 0–44)
AST: 16 U/L (ref 15–41)
Albumin: 1.5 g/dL — ABNORMAL LOW (ref 3.5–5.0)
Alkaline Phosphatase: 50 U/L (ref 38–126)
Anion gap: 11 (ref 5–15)
BUN: 37 mg/dL — ABNORMAL HIGH (ref 6–20)
CO2: 21 mmol/L — ABNORMAL LOW (ref 22–32)
Calcium: 7.3 mg/dL — ABNORMAL LOW (ref 8.9–10.3)
Chloride: 104 mmol/L (ref 98–111)
Creatinine, Ser: 0.45 mg/dL (ref 0.44–1.00)
GFR calc Af Amer: >60 mL/min (ref >60)
GFR calc non Af Amer: >60 mL/min (ref >60)
Glucose, Bld: 94 mg/dL (ref 70–99)
Potassium: 3.6 mmol/L (ref 3.5–5.1)
Sodium: 136 mmol/L (ref 135–145)
Total Bilirubin: 0.5 mg/dL (ref 0.3–1.2)
Total Protein: 4 g/dL — ABNORMAL LOW (ref 6.5–8.1)

## 2020-05-16 LAB — RETICULOCYTES
Immature Retic Fract: 24 % — ABNORMAL HIGH (ref 2.3–15.9)
RBC.: 2.55 MIL/uL — ABNORMAL LOW (ref 3.87–5.11)
Retic Count, Absolute: 92.1 10*3/uL (ref 19.0–186.0)
Retic Ct Pct: 3.6 % — ABNORMAL HIGH (ref 0.4–3.1)

## 2020-05-16 LAB — IRON AND TIBC
Iron: 23 ug/dL — ABNORMAL LOW (ref 28–170)
Saturation Ratios: 27 % (ref 10.4–31.8)
TIBC: 85 ug/dL — ABNORMAL LOW (ref 250–450)
UIBC: 62 ug/dL

## 2020-05-16 LAB — CBC
HCT: 23.4 % — ABNORMAL LOW (ref 36.0–46.0)
Hemoglobin: 7.5 g/dL — ABNORMAL LOW (ref 12.0–15.0)
MCH: 29.2 pg (ref 26.0–34.0)
MCHC: 32.1 g/dL (ref 30.0–36.0)
MCV: 91.1 fL (ref 80.0–100.0)
Platelets: 300 10*3/uL (ref 150–400)
RBC: 2.57 MIL/uL — ABNORMAL LOW (ref 3.87–5.11)
RDW: 16.3 % — ABNORMAL HIGH (ref 11.5–15.5)
WBC: 12.4 10*3/uL — ABNORMAL HIGH (ref 4.0–10.5)
nRBC: 0 % (ref 0.0–0.2)

## 2020-05-16 LAB — RESPIRATORY PANEL BY RT PCR (FLU A&B, COVID)
Influenza A by PCR: NEGATIVE
Influenza B by PCR: NEGATIVE
SARS Coronavirus 2 by RT PCR: NEGATIVE

## 2020-05-16 LAB — VITAMIN B12: Vitamin B-12: 514 pg/mL (ref 180–914)

## 2020-05-16 LAB — LACTIC ACID, PLASMA
Lactic Acid, Venous: 2 mmol/L (ref 0.5–1.9)
Lactic Acid, Venous: 1.6 mmol/L (ref 0.5–1.9)

## 2020-05-16 LAB — FERRITIN: Ferritin: 147 ng/mL (ref 11–307)

## 2020-05-16 LAB — FOLATE: Folate: 6 ng/mL (ref >5.9)

## 2020-05-16 LAB — HIV ANTIBODY (ROUTINE TESTING W REFLEX): HIV Screen 4th Generation wRfx: NONREACTIVE

## 2020-05-16 MED ORDER — PIPERACILLIN-TAZOBACTAM 3.375 G IVPB
3.3750 g | Freq: Three times a day (TID) | INTRAVENOUS | Status: DC
  Filled 2020-05-16: qty 50

## 2020-05-16 MED ORDER — LACTATED RINGERS IV BOLUS
1000.0000 mL | Freq: Once | INTRAVENOUS | Status: AC
  Administered 2020-05-16: 1000 mL via INTRAVENOUS

## 2020-05-16 MED ORDER — ONDANSETRON HCL 4 MG PO TABS
4.0000 mg | ORAL_TABLET | Freq: Four times a day (QID) | ORAL | Status: DC | PRN
  Administered 2020-05-19: 4 mg via ORAL
  Filled 2020-05-16 (×3): qty 1

## 2020-05-16 MED ORDER — ENOXAPARIN SODIUM 30 MG/0.3ML ~~LOC~~ SOLN
30.0000 mg | SUBCUTANEOUS | Status: DC
  Administered 2020-05-16 – 2020-05-18 (×3): 30 mg via SUBCUTANEOUS
  Filled 2020-05-16 (×3): qty 0.3

## 2020-05-16 MED ORDER — ACETAMINOPHEN 650 MG RE SUPP: 650.0000 mg | Freq: Four times a day (QID) | RECTAL | Status: DC | PRN

## 2020-05-16 MED ORDER — COSYNTROPIN 0.25 MG IJ SOLR
0.2500 mg | Freq: Once | INTRAMUSCULAR | Status: DC
  Filled 2020-05-16: qty 0.25

## 2020-05-16 MED ORDER — ONDANSETRON HCL 4 MG/2ML IJ SOLN
4.0000 mg | Freq: Four times a day (QID) | INTRAMUSCULAR | Status: DC | PRN
  Administered 2020-05-16 – 2020-05-28 (×6): 4 mg via INTRAVENOUS
  Filled 2020-05-16 (×7): qty 2

## 2020-05-16 MED ORDER — PIPERACILLIN-TAZOBACTAM 3.375 G IVPB
3.3750 g | Freq: Three times a day (TID) | INTRAVENOUS | Status: DC
  Administered 2020-05-16 – 2020-05-19 (×8): 3.375 g via INTRAVENOUS
  Filled 2020-05-16 (×8): qty 50

## 2020-05-16 MED ORDER — HYDROCORTISONE NA SUCCINATE PF 100 MG IJ SOLR
100.0000 mg | Freq: Three times a day (TID) | INTRAMUSCULAR | Status: DC
  Administered 2020-05-16 – 2020-05-18 (×6): 100 mg via INTRAVENOUS
  Filled 2020-05-16 (×8): qty 2

## 2020-05-16 MED ORDER — DEXTROSE-NACL 5-0.45 % IV SOLN: INTRAVENOUS | Status: DC

## 2020-05-16 MED ORDER — ACETAMINOPHEN 325 MG PO TABS
650.0000 mg | ORAL_TABLET | Freq: Four times a day (QID) | ORAL | Status: DC | PRN
  Administered 2020-05-19: 650 mg via ORAL
  Filled 2020-05-16: qty 2

## 2020-05-16 MED ORDER — METOCLOPRAMIDE HCL 5 MG/ML IJ SOLN
10.0000 mg | Freq: Three times a day (TID) | INTRAMUSCULAR | Status: DC | PRN
  Administered 2020-05-16: 10 mg via INTRAVENOUS
  Filled 2020-05-16 (×2): qty 2

## 2020-05-16 NOTE — Progress Notes (Addendum)
Patient Rapid Response called by this nurse related to unresolved hypotension and tachycardia. Mews continued at red, four. At the bedside continuing to monitor. See rapid response note for further details.

## 2020-05-16 NOTE — ED Notes (Addendum)
Date and time results received: 05/16/20 10:45 AM  Test: Lactic Acid Critical Value: 2.0  Name of Provider Notified: Ralene Bathe, MD   Orders Received? Or Actions Taken?: Orders Received - See Orders for details

## 2020-05-16 NOTE — Progress Notes (Signed)
Awaiting telemetry bed to become available. Bed request is in. Notified patient. Call bell within reach, will continue to monitor closely.

## 2020-05-16 NOTE — Progress Notes (Addendum)
Awaiting transfer to telemetry unit, patient is currently stable and being monitored. Patient currently has MEWs score of yellow and protocol in progress. Dawson Bills  Update: Report given to nurse Tori on [place] unit, patient being transported to 1412. Dawson Bills, RN

## 2020-05-16 NOTE — Significant Event (Signed)
Rapid Response Event Note   Reason for Call :  Nursing staff called for hypotension with systolic in the 35'W after 3L of LR.   Initial Focused Assessment:  Patient is alert and oriented in bed. Does not endorse dizziness or lightheadedness, just acid-reflux/heart burn. Patient has a history of borderline blood pressure with baseline around the 65'K systolic.   Interventions:  Checked blood pressure and relayed it to MD 85/45 (57) and 89/49 (60) upon recheck. MD agrees that these readings are close to her baseline, and that she can stay in her current room.   Plan of Care:  Continue to closely monitor at this time. MD wants to continue maintenance fluids with no additional fluid bolus orders at this time. Encouraged nursing staff to notify rapid response if there was another drop into the 81'E systolic, bradycardia accompanying the hypotension, or mental status changes noted with patient.    Event Summary:   MD Notified: swayze Call Time: [place] Time: 7517 End Time: [place]  Clarene Critchley, RN

## 2020-05-16 NOTE — ED Notes (Signed)
Patient has had 3 episodes of diarrhea and 1 episode of vomiting since arrival

## 2020-05-16 NOTE — Progress Notes (Signed)
Patient mews red r/t hypotension and pulse (tachy). Notified CN Danna and Dr. Benny Lennert, patient bolus ordered and started. Dr. says that RR RN is not needed at this time. Patient is resting. Not in apparent distress upon assessment. Patient husband is at the bedside.

## 2020-05-16 NOTE — Plan of Care (Signed)

## 2020-05-16 NOTE — ED Triage Notes (Signed)
Patient BIBA from home reporting hypotension, n/v. Was admitted approx two weeks ago with same complaint. Had 3 episodes of vomiting this morning and reports being dizzy when moving. BP 71/30 upon EMS arrival.   600 cc NS 4 mg Zofran   20 G IV R AC   BP 81/40 97% RA  P 110

## 2020-05-16 NOTE — ED Provider Notes (Signed)
[facility] DEPT Provider Note   CSN: [id number] Arrival date & time: 05/16/20  4098     History Chief Complaint  Patient presents with  . Hypotension    Stacy [name] is a 39 y.o. female.  The history is provided by the patient and medical records. No language interpreter was used.   Stacy [name] is a 39 y.o. female who presents to the Emergency Department complaining of dehydration. She presents the emergency department by EMS for evaluation of feeling dehydrated. Yesterday afternoon she developed numerous episodes of emesis with abdominal discomfort. On ED arrival she developed diarrhea. No hematemesis. No hematochezia or melena. Her abdominal discomfort resolved after having a large bowel movement. No reports of fevers. She was admitted two weeks ago for similar symptoms. She states she feels very weak when she attempts to stand or move.    Past Medical History:  Diagnosis Date  . Anxiety   . Benign liver cyst   . Chronic intestinal pseudo-obstruction   . GERD (gastroesophageal reflux disease)   . History of UTI     Patient Active Problem List   Diagnosis Date Noted  . Volume depletion, gastrointestinal loss 05/16/2020  . Intractable nausea and vomiting 04/28/2020  . Failure to thrive in adult 04/27/2020  . Physical exam 01/01/2020  . Abdominal pain with vomiting 04/25/2019  . Hyponatremia 04/25/2019  . GERD (gastroesophageal reflux disease) 04/07/2019  . Acute lower UTI 04/07/2019  . Normocytic anemia 04/07/2019  . 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 Iron deficiency anemia 01/22/2018  . Vitamin B deficiency 01/22/2018  . Vitamin D deficiency 01/22/2018  . PCP NOTES >>>>>>>>>>>>>>>>>>>>>>>>>>>>> 06/28/2017  . Underweight 06/27/2017  . Cyst of ovary 10/05/2016  . Orthostatic hypotension 07/27/2013  . Hepatic adenoma 07/27/2013    Past Surgical History:  Procedure Laterality Date  . CESAREAN SECTION     twice  2010, 2013  . Esure  ~2015     OB History    Gravida  2   Para  2   Term  1   Preterm      AB      Living  2     SAB      TAB      Ectopic      Multiple      Live Births  1           Family History  Problem Relation Age of Onset  . CAD Father   . Hypertension Father   . Diabetes Father   . Diabetes Brother   . Colon cancer Neg Hx   . Breast cancer Neg Hx     Social History   Tobacco Use  . Smoking status: Never Smoker  . Smokeless tobacco: Never Used  Vaping Use  . Vaping Use: Never used  Substance Use Topics  . Alcohol use: No    Comment: socially   . Drug use: No    Home Medications Prior to Admission medications   Medication Sig Start Date End Date Taking? Authorizing Provider  acetaminophen (TYLENOL) 500 MG tablet Take 500 mg by mouth every 6 (six) hours as needed for headache (pain).   Yes [provider]  cholecalciferol (VITAMIN D3) 25 MCG (1000 UNIT) tablet TAKE 2 TABLETS (2,000 UNITS TOTAL) BY MOUTH DAILY. Patient taking differently: Take 1,000 Units by mouth 2 (two) times daily.  12/31/19  Yes [name], MD  cyanocobalamin (,VITAMIN B-12,) 1000 MCG/ML injection Inject 1,000 mcg into  the muscle every 30 (thirty) days.   Yes [provider]  famotidine (PEPCID) 20 MG tablet TAKE 1 TABLET BY MOUTH EVERYDAY AT BEDTIME Patient taking differently: Take 20 mg by mouth at bedtime.  04/09/20  Yes [name], MD  ibuprofen (ADVIL) 200 MG tablet Take 400-600 mg by mouth every 6 (six) hours as needed for headache (pain).   Yes [provider]  montelukast (SINGULAIR) 10 MG tablet Take 10 mg by mouth daily. 01/08/20  Yes [provider]  omeprazole (PRILOSEC) 40 MG capsule TAKE 1 CAPSULE BY MOUTH EVERY DAY Patient taking differently: Take 40 mg by mouth daily.  05/12/20  Yes [name], MD  ondansetron (ZOFRAN-ODT) 8 MG disintegrating tablet Take 8 mg by mouth 3 (three) times daily as needed  for nausea/vomiting. 05/12/20  Yes [provider]  polyethylene glycol (MIRALAX / GLYCOLAX) 17 g packet Take 17 g by mouth 2 (two) times daily.   Yes [provider]  Probiotic Product (ALIGN PO) Take 1 capsule by mouth daily.   Yes [provider]  Prucalopride Succinate (MOTEGRITY) 2 MG TABS Take 1 tablet (2 mg total) by mouth daily. 05/14/20  Yes [name], MD  ciprofloxacin (CIPRO) 250 MG tablet Take 250 mg by mouth 2 (two) times daily. 3 day supply 05/12/20   [provider]  COMPRO 25 MG suppository Place 25 mg rectally every 12 (twelve) hours as needed for nausea. 05/12/20   [provider]  ondansetron (ZOFRAN ODT) 4 MG disintegrating tablet Take 1 tablet (4 mg total) by mouth every 8 (eight) hours as needed for nausea or vomiting. Patient not taking: Reported on 05/16/2020 05/01/20   Florencia Reasons, MD  POLY-IRON 150 150 MG capsule TAKE 1 CAPSULE BY MOUTH TWICE A DAY Patient not taking: Reported on 05/16/2020 04/11/20   [name], MD    Allergies    Tetracyclines & related, Erythromycin, Raspberry, and Sulfonamide derivatives  Review of Systems   Review of Systems  All other systems reviewed and are negative.   Physical Exam Updated Vital Signs BP (!) 77/49 (BP Location: Left Arm)   Pulse (!) 115   Temp 98.1 F (36.7 C) (Oral)   Resp 16   Ht 5\' 3"  (1.6 m)   Wt 42.6 kg   SpO2 99%   BMI 16.65 kg/m   Physical Exam Vitals and nursing note reviewed.  Constitutional:      General: She is in acute distress.     Appearance: She is well-developed. She is ill-appearing.  HENT:     Head: Normocephalic and atraumatic.  Cardiovascular:     Rate and Rhythm: Normal rate and regular rhythm.     Heart sounds: No murmur heard.   Pulmonary:     Effort: Pulmonary effort is normal. No respiratory distress.     Breath sounds: Normal breath sounds.  Abdominal:     Palpations: Abdomen is soft.     Tenderness: There is no abdominal  tenderness. There is no guarding or rebound.  Musculoskeletal:        General: No swelling or tenderness.  Skin:    General: Skin is warm and dry.  Neurological:     Mental Status: She is alert and oriented to person, place, and time.  Psychiatric:        Behavior: Behavior normal.     ED Results / Procedures / Treatments   Labs (all labs ordered are listed, but only abnormal results are  displayed) Labs Reviewed  COMPREHENSIVE METABOLIC PANEL - Abnormal; Notable for the following components:      Result Value   CO2 21 (*)    BUN 37 (*)    Calcium 7.3 (*)    Total Protein 4.0 (*)    Albumin 1.5 (*)    All other components within normal limits  CBC WITH DIFFERENTIAL/PLATELET - Abnormal; Notable for the following components:   WBC 14.1 (*)    RBC 2.93 (*)    Hemoglobin 8.7 (*)    HCT 26.9 (*)    RDW 16.3 (*)    Neutro Abs 11.9 (*)    Abs Immature Granulocytes 0.46 (*)    All other components within normal limits  LACTIC ACID, PLASMA - Abnormal; Notable for the following components:   Lactic Acid, Venous 2.0 (*)    All other components within normal limits  CBC - Abnormal; Notable for the following components:   WBC 12.4 (*)    RBC 2.57 (*)    Hemoglobin 7.5 (*)    HCT 23.4 (*)    RDW 16.3 (*)    All other components within normal limits  CREATININE, SERUM - Abnormal; Notable for the following components:   Creatinine, Ser 0.35 (*)    All other components within normal limits  IRON AND TIBC - Abnormal; Notable for the following components:   Iron 23 (*)    TIBC 85 (*)    All other components within normal limits  RETICULOCYTES - Abnormal; Notable for the following components:   Retic Ct Pct 3.6 (*)    RBC. 2.55 (*)    Immature Retic Fract 24.0 (*)    All other components within normal limits  RESPIRATORY PANEL BY RT PCR (FLU A&B, COVID)  URINE CULTURE  CULTURE, BLOOD (SINGLE)  GASTROINTESTINAL PANEL BY PCR, STOOL (REPLACES STOOL CULTURE)  C DIFFICILE QUICK  SCREEN W PCR REFLEX  LACTIC ACID, PLASMA  VITAMIN B12  FOLATE  FERRITIN  URINALYSIS, ROUTINE W REFLEX MICROSCOPIC  HIV ANTIBODY (ROUTINE TESTING W REFLEX)  GLIADIN ANTIBODIES, SERUM  TISSUE TRANSGLUTAMINASE, IGA  RETICULIN ANTIBODIES, IGA W TITER    EKG None  Radiology No results found.  Procedures Procedures (including critical care time) CRITICAL CARE Performed by: Quintella Reichert   Total critical care time: 45 minutes  Critical care time was exclusive of separately billable procedures and treating other patients.  Critical care was necessary to treat or prevent imminent or life-threatening deterioration.  Critical care was time spent personally by me on the following activities: development of treatment plan with patient and/or surrogate as well as nursing, discussions with consultants, evaluation of patient's response to treatment, examination of patient, obtaining history from patient or surrogate, ordering and performing treatments and interventions, ordering and review of laboratory studies, ordering and review of radiographic studies, pulse oximetry and re-evaluation of patient's condition.  Medications Ordered in ED Medications  enoxaparin (LOVENOX) injection 30 mg (30 mg Subcutaneous Given 05/16/20 1252)  dextrose 5 %-0.45 % sodium chloride infusion ( Intravenous New Bag/Given 05/16/20 1252)  acetaminophen (TYLENOL) tablet 650 mg (has no administration in time range)    Or  acetaminophen (TYLENOL) suppository 650 mg (has no administration in time range)  ondansetron (ZOFRAN) tablet 4 mg ( Oral See Alternative 05/16/20 1325)    Or  ondansetron (ZOFRAN) injection 4 mg (4 mg Intravenous Given 05/16/20 1325)  cosyntropin (CORTROSYN) injection 0.25 mg (has no administration in time range)  hydrocortisone sodium succinate (SOLU-CORTEF) 100 MG injection 100  mg (has no administration in time range)  lactated ringers bolus 1,000 mL (0 mLs Intravenous Stopped 05/16/20 1123)   lactated ringers bolus 1,000 mL (0 mLs Intravenous Stopped 05/16/20 1123)  lactated ringers bolus 1,000 mL (1,000 mLs Intravenous New Bag/Given 05/16/20 1340)    ED Course  I have reviewed the triage vital signs and the nursing notes.  Pertinent labs & imaging results that were available during my care of the patient were reviewed by me and considered in my medical decision making (see chart for details).    MDM Rules/Calculators/A&P                          Patient with history of intestinal pseudo-obstruction here for evaluation of vomiting and weakness. She developed diarrhea on ED presentation. She was hypotensive been tachycardic on ED presentation. Current presentation is not consistent with sepsis. Vital signs abnormalities likely secondary to profound dehydration. She was treated with aggressive IV fluid hydration with improvement in her blood pressure. CBC with anemia, mild leukocytosis. BMP with significant elevation and BUN compared to baseline concerning for significant dehydration. Discussed with Dr. [name] with G.I., will see the patient and consult. Medicine consulted for admission for further workup and treatment. Final Clinical Impression(s) / ED Diagnoses Final diagnoses:  None    Rx / DC Orders ED Discharge Orders    None       Quintella Reichert, MD 05/16/20 1519

## 2020-05-16 NOTE — Consult Note (Signed)
[facility] Consult  Referring Provider: ER Primary Care Physician:  [name], MD Primary Gastroenterologist: Dr.Schooler/[facility]  Reason for Consultation: Hypotension, history of chronic intestinal pseudoobstruction  HPI: Stacy [name] is a 39 y.o. female with a history of chronic intestinal pseudo-obstruction, previously seen at [facility] in [place] as well as [facility], presents to the ER with complains of multiple episodes of vomiting and diarrhea.  Patient states she has suffered with constipation for the last 7 years, was at [place] at one point without much benefit, ultimately was diagnosed with chronic intestinal pseudoobstruction and dysmotility, for the last 3 to 4 years has been maintained on Motegrity 1 mg daily along with MiraLAX 17 g twice daily. Over the course of last 7 years her weight has decreased from 135 pounds to 100 pounds and over the last few years she has struggled to stay at 90 pounds. She has chronic nausea for which she takes 8 mg Zofran as needed 3 times a day without much benefit.  She is also on Prilosec for acid reflux and heartburn.  As per her husband present at bedside, for the past 7 years her symptoms have been intermittent, she will do well for several months then will have bouts of nausea, vomiting, change in bowel habits.  However, recently she has had more struggle with nausea, vomiting and constipation.  She was recently admitted on 04/27/2020 and discharged on 05/01/2020 with intractable nausea, vomiting, failure to thrive, hyponatremia, anemia and chronic intestinal pseudoobstruction.  At that point stool testing for C. difficile and GI pathogen panel were negative.  She was discharged on Keflex and Flagyl and was seen by Dr. Michail Sermon in the office. She continued to have worsening constipation, and was given colonic prep, which helped with some bowel movements, but then patient reports having recurrence of  constipation.   Her last EGD and colonoscopy were in 04/2018 at [facility] where she was noted to have acute erosive gastritis, 6 mm colonic polyp, internal and external hemorrhoids, tortuous colon, redundant and as per documentation normal colonic biopsies. Multiple prior work-up includes  Normal barium enema from 03/2014, except redundant colon CT abdomen pelvis from 2017: Fecalization of distal ileum reflecting small bowel dysmotility CT enterography from 2020: Small bowel diffusely featureless with fecalized contents reflecting slow transit. 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  Past Medical History:  Diagnosis Date  . Anxiety   . Benign liver cyst   . Chronic intestinal pseudo-obstruction   . GERD (gastroesophageal reflux disease)   . History of UTI     Past Surgical History:  Procedure Laterality Date  . CESAREAN SECTION     twice 2010, 2013  . Esure  ~2015    Prior to Admission medications   Medication Sig Start Date End Date Taking? Authorizing Provider  acetaminophen (TYLENOL) 500 MG tablet Take 500 mg by mouth every 6 (six) hours as needed for headache (pain).   Yes [provider]  cholecalciferol (VITAMIN D3) 25 MCG (1000 UNIT) tablet TAKE 2 TABLETS (2,000 UNITS TOTAL) BY MOUTH DAILY. Patient taking differently: Take 1,000 Units by mouth 2 (two) times daily.  12/31/19  Yes [name], MD  cyanocobalamin (,VITAMIN B-12,) 1000 MCG/ML injection Inject 1,000 mcg into the muscle every 30 (thirty) days.  Yes [provider]  famotidine (PEPCID) 20 MG tablet TAKE 1 TABLET BY MOUTH EVERYDAY AT BEDTIME Patient taking differently: Take 20 mg by mouth at bedtime.  04/09/20  Yes [name], MD  ibuprofen (ADVIL) 200 MG tablet Take 400-600 mg by mouth every 6 (six) hours as needed for headache (pain).   Yes [provider]  montelukast (SINGULAIR) 10 MG tablet Take 10 mg by mouth daily. 01/08/20  Yes [provider]  omeprazole (PRILOSEC) 40 MG capsule TAKE 1 CAPSULE BY MOUTH EVERY DAY Patient taking differently: Take 40 mg by mouth daily.  05/12/20  Yes [name], MD  ondansetron (ZOFRAN-ODT) 8 MG disintegrating tablet Take 8 mg by mouth 3 (three) times daily as needed for nausea/vomiting. 05/12/20  Yes [provider]  polyethylene glycol (MIRALAX / GLYCOLAX) 17 g packet Take 17 g by mouth 2 (two) times daily.   Yes [provider]  Probiotic Product (ALIGN PO) Take 1 capsule by mouth daily.   Yes [provider]  Prucalopride Succinate (MOTEGRITY) 2 MG TABS Take 1 tablet (2 mg total) by mouth daily. 05/14/20  Yes [name], MD  ciprofloxacin (CIPRO) 250 MG tablet Take 250 mg by mouth 2 (two) times daily. 3 day supply 05/12/20   [provider]  COMPRO 25 MG suppository Place 25 mg rectally every 12 (twelve) hours as needed for nausea. 05/12/20   [provider]  ondansetron (ZOFRAN ODT) 4 MG disintegrating tablet Take 1 tablet (4 mg total) by mouth every 8 (eight) hours as needed for nausea or vomiting. Patient not taking: Reported on 05/16/2020 05/01/20   Florencia Reasons, MD  POLY-IRON 150 150 MG capsule TAKE 1 CAPSULE BY MOUTH TWICE A DAY Patient not taking: Reported on 05/16/2020 04/11/20   [name], MD    Current Facility-Administered Medications  Medication Dose Route Frequency Provider Last Rate Last Admin  . acetaminophen (TYLENOL) tablet 650 mg  650 mg Oral Q6H PRN Swayze, Ava, DO       Or  . acetaminophen (TYLENOL) suppository 650 mg  650 mg Rectal Q6H PRN Swayze, Ava, DO      . [START ON 05/17/2020] cosyntropin (CORTROSYN) injection 0.25 mg  0.25 mg Intravenous Once Swayze, Ava, DO      .  dextrose 5 %-0.45 % sodium chloride infusion   Intravenous Continuous Swayze, Ava, DO 100 mL/hr at 05/16/20 1252 New Bag at 05/16/20 1252  . enoxaparin (LOVENOX) injection 30 mg  30 mg Subcutaneous Q24H Swayze, Ava, DO   30 mg at 05/16/20 1252  . hydrocortisone sodium succinate (SOLU-CORTEF) 100 MG injection 100 mg  100 mg Intravenous Q8H Swayze, Ava, DO      . ondansetron (ZOFRAN) tablet 4 mg  4 mg Oral Q6H PRN Swayze, Ava, DO       Or  . ondansetron (ZOFRAN) injection 4 mg  4 mg Intravenous Q6H PRN Swayze, Ava, DO   4 mg at 05/16/20 1325    Allergies as of 05/16/2020 - Review Complete 05/16/2020  Allergen Reaction Noted  . Tetracyclines & related Swelling 09/11/2013  . Erythromycin Other (See Comments) 06/06/2008  . Raspberry Rash 09/11/2013  . Sulfonamide derivatives Rash 06/06/2008    Family History  Problem Relation Age of Onset  . CAD Father   . Hypertension Father   . Diabetes Father   . Diabetes Brother   . Colon cancer Neg Hx   . Breast cancer Neg Hx  Social History   Socioeconomic History  . Marital status: Married    Spouse name: Not on file  . Number of children: 2  . Years of education: Not on file  . Highest education level: Not on file  Occupational History  . 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 Smoking status: Never Smoker  . Smokeless tobacco: Never Used  Vaping Use  . Vaping Use: Never used  Substance and Sexual Activity  . Alcohol use: No    Comment: socially   . Drug use: No  . Sexual activity: Yes    Birth control/protection: None  Other Topics Concern  . Not on file  Social History Narrative   Household: pt, husband , 2 children   2 boys: 2010, 2013   P2G2   Original  from [place], no foreign trips   Social Determinants of Radio broadcast assistant Strain:   . Difficulty of Paying Living Expenses: Not on file  Food Insecurity:   . Worried About Charity fundraiser in the Last Year: Not on file  .  Ran Out of Food in the Last Year: Not on file  Transportation Needs:   . Lack of Transportation (Medical): Not on file  . Lack of Transportation (Non-Medical): Not on file  Physical Activity:   . Days of Exercise per Week: Not on file  . Minutes of Exercise per Session: Not on file  Stress:   . Feeling of Stress : Not on file  Social Connections:   . Frequency of Communication with Friends and Family: Not on file  . Frequency of Social Gatherings with Friends and Family: Not on file  . Attends Religious Services: Not on file  . Active Member of Clubs or Organizations: Not on file  . Attends Archivist Meetings: Not on file  . Marital Status: Not on file  Intimate Partner Violence:   . Fear of Current or Ex-Partner: Not on file  . Emotionally Abused: Not on file  . Physically Abused: Not on file  . Sexually Abused: Not on file    Review of Systems: Positive for: GI: Described in detail in HPI.    Gen:  fatigue, weakness, malaise, involuntary weight loss, denies any fever, chills, rigors, night sweats, anorexia,and sleep disorder CV: Denies chest pain, angina, palpitations, syncope, orthopnea, PND, peripheral edema, and claudication. Resp: Denies dyspnea, cough, sputum, wheezing, coughing up blood. GU : Denies urinary burning, blood in urine, urinary frequency, urinary hesitancy, nocturnal urination, and urinary incontinence. MS: Denies joint pain or swelling.  Denies muscle weakness, cramps, atrophy.  Derm: Denies rash, itching, oral ulcerations, hives, unhealing ulcers.  Psych:  Anxiety, denies depression, memory loss, suicidal ideation, hallucinations,  and confusion. Heme: Denies bruising, bleeding, and enlarged lymph nodes. Neuro:  Denies any headaches, dizziness, paresthesias. Endo:  Denies any problems with DM, thyroid, adrenal function.  Physical Exam: Vital signs in last 24 hours: Temp:  [97.8 F (36.6 C)-98.8 F (37.1 C)] 98.8 F (37.1 C) (10/01 1545) Pulse  Rate:  [105-121] 121 (10/01 1545) Resp:  [15-20] 16 (10/01 1545) BP: (73-90)/(43-58) 75/43 (10/01 1545) SpO2:  [96 %-99 %] 97 % (10/01 1545) Weight:  [42.6 kg] 42.6 kg (10/01 0841) Last BM Date: 05/16/20  General:   Thinly built, in distress with nausea, emesis bag at bedside Head:  Normocephalic and atraumatic. Eyes: Prominent pallor Ears:  Normal auditory acuity. Nose:  No deformity, discharge,  or lesions. Mouth:  No  deformity or lesions.  Oropharynx pink & moist. Neck:  Supple; no masses or thyromegaly. Lungs:  Clear throughout to auscultation.   No wheezes, crackles, or rhonchi. No acute distress. Heart: Tachycardic Extremities:  Without clubbing or edema. Neurologic:  Alert and  oriented x4;  grossly normal neurologically. Skin:  Intact without significant lesions or rashes. Psych:  Alert and cooperative. Normal mood and affect. Abdomen:  Soft, nontender and nondistended. No masses, hepatosplenomegaly or hernias noted. Normal bowel sounds, without guarding, and without rebound.         Lab Results: Recent Labs    05/16/20 0856 05/16/20 1218  WBC 14.1* 12.4*  HGB 8.7* 7.5*  HCT 26.9* 23.4*  PLT 334 300   BMET Recent Labs    05/16/20 0856 05/16/20 1218  NA 136  --   K 3.6  --   CL 104  --   CO2 21*  --   GLUCOSE 94  --   BUN 37*  --   CREATININE 0.45 0.35*  CALCIUM 7.3*  --    LFT Recent Labs    05/16/20 0856  PROT 4.0*  ALBUMIN 1.5*  AST 16  ALT 11  ALKPHOS 50  BILITOT 0.5   PT/INR No results for input(s): LABPROT, INR in the last 72 hours.  Studies/Results: No results found.  Impression: History of chronic intestinal pseudoobstruction, small bowel dysmotility Current presentation with nausea, vomiting, diarrhea which started from yesterday Hypotensive(has been hypotensive since 05/08/2020)  Leukocytosis Normocytic anemia Normal platelets(recent outpatient work-up with echo showed platelet count of over 700?  Reactive thrombocytosis which  is resolved) History of focal nodular hyperplasia of liver Malnutrition, total protein 4, albumin 1.5 Mild acidosis, bicarb 21 but normal renal function  Plan: For now, IV resuscitation is important. She has been started on D5 half-normal saline at 100 cc an hour. Continue antiemetics, Zofran 4 mg IV every 6 hours as needed for nausea and vomiting. Will add Reglan 10 mg IV every 8 hours as needed, which will help not only for nausea and vomiting but also as a prokinetic agent. There are concerns for adrenal insufficiency and as such patient has been started on Solu-Cortef 100 mg IV every 8 hours. Since diarrhea is unusual for her, she is on enteric precautions and stool studies have been sent for C. difficile and GI pathogen panel to rule out infectious etiology. I will order an abdominal x-ray(diarrhea sometimes can be related to overflow incontinence with underlying constipation as patient has had history of dysmotility for several years). We will continue to follow.    LOS: 0 days   Ronnette Juniper, MD  05/16/2020, 3:58 PM

## 2020-05-16 NOTE — H&P (Signed)
Stacy [name] is an 39 y.o. female.   Chief Complaint: Dehydration, nausea, vomiting, abdominal pain. HPI: The patient is a 39 yr old woman. She states that she used to struggle with weight loss. However, about 7 years ago she began losing weight without trying. She had intermittent nausea and vomiting and frequently not being able to eat. She continues to lose weight. She says it is like a nightmare. Recently the patient was discharged from this institution after a stay for intestinal pseudo-obstruction. Today the patient's pain was relieved with BM.   The patient carries a past medical history significant for anxiety, chronic intestinal pseudo-obstruction, GERD, and FTT.  In the ED she was found to be hypotensive with tachycardia. Temperature, respiratory rate, and O2 saturations were normal. Creatinine is 0.45. Fe 23, TIBC 85, Ferritin is 147, albumin is 1.5, LFT's are normal. Lactic Acid is  2.0 upon presentation, but came down to 1.6 with hydration. WBC is 14.1 (increased bands and neutrophils), and Hemoglobin is 8.7. This is down from 9.2 on 05/01/2020. He is negative for COVID.   The patient denies fevers, chills, cough, shortness of breath, wheezes, rashes, headaches, or neurological changes. She admits to dizziness with standing, weakness, and feeling generally unwell.  [facility] were consulted to admit the patient for further evaluation and treatment. She will be admitted to a telemetry bed. I have ordered celiac work up as well as a cosyntropin stim test. EDP has discussed the patient with Dr. [name].  The patient remains hypotensive despite 3 L fluid. Will add hydrocortisone.  Past Medical History:  Diagnosis Date   Anxiety    Benign liver cyst    Chronic intestinal pseudo-obstruction    GERD (gastroesophageal reflux disease)    History of UTI     Past Surgical History:  Procedure Laterality Date   CESAREAN SECTION     twice 2010, 2013   Esure  ~2015    Family  History  Problem Relation Age of Onset   CAD Father    Hypertension Father    Diabetes Father    Diabetes Brother    Colon cancer Neg Hx    Breast cancer Neg Hx    Social History:  reports that she has never smoked. She has never used smokeless tobacco. She reports that she does not drink alcohol and does not use drugs. Medications Prior to Admission  Medication Sig Dispense Refill   acetaminophen (TYLENOL) 500 MG tablet Take 500 mg by mouth every 6 (six) hours as needed for headache (pain).     cholecalciferol (VITAMIN D3) 25 MCG (1000 UNIT) tablet TAKE 2 TABLETS (2,000 UNITS TOTAL) BY MOUTH DAILY. (Patient taking differently: Take 1,000 Units by mouth 2 (two) times daily. ) 200 tablet 3   cyanocobalamin (,VITAMIN B-12,) 1000 MCG/ML injection Inject 1,000 mcg into the muscle every 30 (thirty) days.     famotidine (PEPCID) 20 MG tablet TAKE 1 TABLET BY MOUTH EVERYDAY AT BEDTIME (Patient taking differently: Take 20 mg by mouth at bedtime. ) 30 tablet 6   ibuprofen (ADVIL) 200 MG tablet Take 400-600 mg by mouth every 6 (six) hours as needed for headache (pain).     montelukast (SINGULAIR) 10 MG tablet Take 10 mg by mouth daily.     omeprazole (PRILOSEC) 40 MG capsule TAKE 1 CAPSULE BY MOUTH EVERY DAY (Patient taking differently: Take 40 mg by mouth daily. ) 90 capsule 1   ondansetron (ZOFRAN-ODT) 8 MG disintegrating tablet Take 8 mg by mouth  3 (three) times daily as needed for nausea/vomiting.     polyethylene glycol (MIRALAX / GLYCOLAX) 17 g packet Take 17 g by mouth 2 (two) times daily.     Probiotic Product (ALIGN PO) Take 1 capsule by mouth daily.     Prucalopride Succinate (MOTEGRITY) 2 MG TABS Take 1 tablet (2 mg total) by mouth daily. 90 tablet 1   ciprofloxacin (CIPRO) 250 MG tablet Take 250 mg by mouth 2 (two) times daily. 3 day supply     COMPRO 25 MG suppository Place 25 mg rectally every 12 (twelve) hours as needed for nausea.     ondansetron (ZOFRAN ODT) 4 MG disintegrating tablet  Take 1 tablet (4 mg total) by mouth every 8 (eight) hours as needed for nausea or vomiting. (Patient not taking: Reported on 05/16/2020) 30 tablet 0   POLY-IRON 150 150 MG capsule TAKE 1 CAPSULE BY MOUTH TWICE A DAY (Patient not taking: Reported on 05/16/2020) 60 capsule 11    Allergies:  Allergies  Allergen Reactions   Tetracyclines & Related Swelling    Swelling in spine; required spinal tap.    Erythromycin Other (See Comments)    Swelling in spine; required spinal tap   Raspberry Rash   Sulfonamide Derivatives Rash    Reaction to cream    Pertinent items noted in HPI and remainder of comprehensive ROS otherwise negative.  General appearance: alert, cooperative and mild distress Head: Normocephalic, without obvious abnormality, atraumatic Eyes: conjunctivae/corneas clear. PERRL, EOM's intact. Fundi benign. Throat: lips, mucosa, and tongue normal; teeth and gums normal Neck: no adenopathy, no carotid bruit, no JVD, supple, symmetrical, trachea midline and thyroid not enlarged, symmetric, no tenderness/mass/nodules Resp:  No increased work of breathing. No wheezes, rales, or rhonchi. No tactile fremitus. Chest wall: no tenderness Cardio: regular rate and rhythm, S1, S2 normal, no murmur, click, rub or gallop GI: soft, non-tender; bowel sounds normal; no masses,  no organomegaly Extremities: extremities normal, atraumatic, no cyanosis or edema Pulses: 2+ and symmetric Skin: Skin color, texture, turgor normal. No rashes or lesions Lymph nodes: Cervical, supraclavicular, and axillary nodes normal. Neurologic: Alert and oriented X 3, normal strength and tone. Normal symmetric reflexes. Normal coordination and gait  Results for orders placed or performed during the hospital encounter of 05/16/20 (from the past 48 hour(s))  Comprehensive metabolic panel     Status: Abnormal   Collection Time: 05/16/20  8:56 AM  Result Value Ref Range   Sodium 136 135 - 145 mmol/L   Potassium 3.6 3.5 -  5.1 mmol/L   Chloride 104 98 - 111 mmol/L   CO2 21 (L) 22 - 32 mmol/L   Glucose, Bld 94 70 - 99 mg/dL    Comment: Glucose reference range applies only to samples taken after fasting for at least 8 hours.   BUN 37 (H) 6 - 20 mg/dL   Creatinine, Ser 0.45 0.44 - 1.00 mg/dL   Calcium 7.3 (L) 8.9 - 10.3 mg/dL   Total Protein 4.0 (L) 6.5 - 8.1 g/dL   Albumin 1.5 (L) 3.5 - 5.0 g/dL   AST 16 15 - 41 U/L   ALT 11 0 - 44 U/L   Alkaline Phosphatase 50 38 - 126 U/L   Total Bilirubin 0.5 0.3 - 1.2 mg/dL   GFR calc non Af Amer >60 >60 mL/min   GFR calc Af Amer >60 >60 mL/min   Anion gap 11 5 - 15    Comment: Performed at [facility], [address] [place], [place] [address]  CBC with Differential     Status: Abnormal   Collection Time: 05/16/20  8:56 AM  Result Value Ref Range   WBC 14.1 (H) 4.0 - 10.5 K/uL   RBC 2.93 (L) 3.87 - 5.11 MIL/uL   Hemoglobin 8.7 (L) 12.0 - 15.0 g/dL   HCT 26.9 (L) 36 - 46 %   MCV 91.8 80.0 - 100.0 fL   MCH 29.7 26.0 - 34.0 pg   MCHC 32.3 30.0 - 36.0 g/dL   RDW 16.3 (H) 11.5 - 15.5 %   Platelets 334 150 - 400 K/uL   nRBC 0.0 0.0 - 0.2 %   Neutrophils Relative % 84 %   Neutro Abs 11.9 (H) 1.7 - 7.7 K/uL   Lymphocytes Relative 6 %   Lymphs Abs 0.9 0.7 - 4.0 K/uL   Monocytes Relative 6 %   Monocytes Absolute 0.8 0 - 1 K/uL   Eosinophils Relative 0 %   Eosinophils Absolute 0.0 0 - 0 K/uL   Basophils Relative 1 %   Basophils Absolute 0.1 0 - 0 K/uL   WBC Morphology INCREASED BANDS (>20% BANDS)     Comment: MILD LEFT SHIFT (1-5% METAS, OCC MYELO, OCC BANDS) VACUOLATED NEUTROPHILS    nRBC HIDE 0 /100 WBC   Immature Granulocytes 3 %   Abs Immature Granulocytes 0.46 (H) 0.00 - 0.07 K/uL   Polychromasia PRESENT     Comment: Performed at [facility], [place] [address], [place], [place] [address]  Lactic acid, plasma     Status: Abnormal   Collection Time: 05/16/20  8:56 AM  Result Value Ref Range   Lactic Acid, Venous 2.0  (HH) 0.5 - 1.9 mmol/L    Comment: CRITICAL RESULT CALLED TO, READ BACK BY AND VERIFIED WITH: WILSON G.10.01.2021 @ 1043 BY MECIAL J. Performed at [facility], [place] [address] [place], [place] [address]   Respiratory Panel by RT PCR (Flu A&B, Covid) - Nasopharyngeal Swab     Status: None   Collection Time: 05/16/20 10:54 AM   Specimen: Nasopharyngeal Swab  Result Value Ref Range   SARS Coronavirus 2 by RT PCR NEGATIVE NEGATIVE    Comment: (NOTE) SARS-CoV-2 target nucleic acids are NOT DETECTED.  The SARS-CoV-2 RNA is generally detectable in upper respiratoy specimens during the acute phase of infection. The lowest concentration of SARS-CoV-2 viral copies this assay can detect is 131 copies/mL. A negative result does not preclude SARS-Cov-2 infection and should not be used as the sole basis for treatment or other patient management decisions. A negative result may occur with  improper specimen collection/handling, submission of specimen other than nasopharyngeal swab, presence of viral mutation(s) within the areas targeted by this assay, and inadequate number of viral copies (<131 copies/mL). A negative result must be combined with clinical observations, patient history, and epidemiological information. The expected result is Negative.  Fact Sheet for Patients:  PinkCheek.be  Fact Sheet for Healthcare Providers:  GravelBags.it  This test is no t yet approved or cleared by the [place] FDA and  has been authorized for detection and/or diagnosis of SARS-CoV-2 by FDA under an Emergency Use Authorization (EUA). This EUA will remain  in effect (meaning this test can be used) for the duration of the COVID-19 declaration under Section 564(b)(1) of the Act, 21 U.S.C. section 360bbb-3(b)(1), unless the authorization is terminated or revoked sooner.     Influenza A by PCR NEGATIVE NEGATIVE   Influenza B  by  PCR NEGATIVE NEGATIVE    Comment: (NOTE) The Xpert Xpress SARS-CoV-2/FLU/RSV assay is intended as an aid in  the diagnosis of influenza from Nasopharyngeal swab specimens and  should not be used as a sole basis for treatment. Nasal washings and  aspirates are unacceptable for Xpert Xpress SARS-CoV-2/FLU/RSV  testing.  Fact Sheet for Patients: PinkCheek.be  Fact Sheet for Healthcare Providers: GravelBags.it  This test is not yet approved or cleared by the [place] FDA and  has been authorized for detection and/or diagnosis of SARS-CoV-2 by  FDA under an Emergency Use Authorization (EUA). This EUA will remain  in effect (meaning this test can be used) for the duration of the  Covid-19 declaration under Section 564(b)(1) of the Act, 21  U.S.C. section 360bbb-3(b)(1), unless the authorization is  terminated or revoked. Performed at [facility], [place] [address] [place], [place] [address]   Lactic acid, plasma     Status: None   Collection Time: 05/16/20 12:00 PM  Result Value Ref Range   Lactic Acid, Venous 1.6 0.5 - 1.9 mmol/L    Comment: Performed at [facility], [place] [address], [place], [place] [address]  CBC     Status: Abnormal   Collection Time: 05/16/20 12:18 PM  Result Value Ref Range   WBC 12.4 (H) 4.0 - 10.5 K/uL   RBC 2.57 (L) 3.87 - 5.11 MIL/uL   Hemoglobin 7.5 (L) 12.0 - 15.0 g/dL   HCT 23.4 (L) 36 - 46 %   MCV 91.1 80.0 - 100.0 fL   MCH 29.2 26.0 - 34.0 pg   MCHC 32.1 30.0 - 36.0 g/dL   RDW 16.3 (H) 11.5 - 15.5 %   Platelets 300 150 - 400 K/uL   nRBC 0.0 0.0 - 0.2 %    Comment: Performed at [facility], [place] [address] [place], [place] [address]  Creatinine, serum     Status: Abnormal   Collection Time: 05/16/20 12:18 PM  Result Value Ref Range   Creatinine, Ser 0.35 (L) 0.44 - 1.00 mg/dL   GFR calc non Af Amer >60 >60 mL/min   GFR calc Af  Amer >60 >60 mL/min    Comment: Performed at [facility], [place] [address] [place], [place] [address]  Reticulocytes     Status: Abnormal   Collection Time: 05/16/20 12:18 PM  Result Value Ref Range   Retic Ct Pct 3.6 (H) 0.4 - 3.1 %   RBC. 2.55 (L) 3.87 - 5.11 MIL/uL   Retic Count, Absolute 92.1 19.0 - 186.0 K/uL   Immature Retic Fract 24.0 (H) 2.3 - 15.9 %    Comment: Performed at [facility], [place] [address], [place], [place] [address]   @[email or web address] @  Blood pressure (!) 89/54, pulse (!) 111, temperature 98.7 F (37.1 C), temperature source Oral, resp. rate 15, height 5\' 3"  (1.6 m), weight 42.6 kg, SpO2 97 %.   Assessment/Plan Problem  Volume Depletion, Gastrointestinal Loss  Intractable Nausea and Vomiting  Failure to Thrive in Adult  Abdominal Pain With Vomiting  Gerd (Gastroesophageal Reflux Disease)  Chronic Intestinal Pseudo-Obstruction  Iron Deficiency Anemia  Underweight   Sepsis: hypotension, leukocytosis, tachycardia with lactic acidosis. Lactic acid has responded to hydration, but the hypotension has not. No infectious source is apparent. However, given the overall GI character of her complaints I have placed the patient on IV Zosyn empirically, and checked her for MRSA. She has now received 3L of  LR. I have also given her stress dose of hydrocortisone.  Volume depletion/hypotension: Pt has failed to respond to IV fluids. I have added stress doses of hydrocortisone. She will continue to receive IV fluids. Cosyntropin stress test has been ordered, but is less likely to be helpful given the steroids.  Intractable nausea/vomiting/weight loss/abdominal pain: Concern for celiac disease, bacterial overgrowth of small bowel, adrenal insufficiency. GI consulted. Celiac studies ordered.   GERD: Continue PPI  Fe deficiency anemia: Concern for malnutrition/malabsorption. Will give 510 mg of feraheme.  I have seen and examined this patient  myself. I have spent 76 minutes in his evaluation and admission.  DVT prophylaxis: Lovenox CODE STATUS: Full Code Family Communication: Spouse at bedside Disposition: The patient has been admitted to a med surg bed as an inpatient.  Severity of Illness: The appropriate patient status for this patient is INPATIENT. Inpatient status is judged to be reasonable and necessary in order to provide the required intensity of service to ensure the patient's safety. The patient's presenting symptoms, physical exam findings, and initial radiographic and laboratory data in the context of their chronic comorbidities is felt to place them at high risk for further clinical deterioration. Furthermore, it is not anticipated that the patient will be medically stable for discharge from the hospital within 2 midnights of admission. The following factors support the patient status of inpatient.   " The patient's presenting symptoms include weakness, abdominal pain, nausea, and vomiting. " The worrisome physical exam findings include dehydration, weakness. " The initial radiographic and laboratory data are worrisome because of AKI, dehydration,low albuimn, leukocytosis, and anemia " The chronic co-morbidities include chronic weightloss, chronic intestinal pseudo-obstruction.   * I certify that at the point of admission it is my clinical judgment that the patient will require inpatient hospital care spanning beyond 2 midnights from the point of admission due to high intensity of service, high risk for further deterioration and high frequency of surveillance required.*    Kylii Ennis 05/16/2020, 2:33 PM

## 2020-05-17 DIAGNOSIS — E869 Volume depletion, unspecified: Secondary | ICD-10-CM | POA: Diagnosis not present

## 2020-05-17 DIAGNOSIS — N390 Urinary tract infection, site not specified: Secondary | ICD-10-CM

## 2020-05-17 DIAGNOSIS — D509 Iron deficiency anemia, unspecified: Secondary | ICD-10-CM

## 2020-05-17 DIAGNOSIS — A419 Sepsis, unspecified organism: Secondary | ICD-10-CM

## 2020-05-17 DIAGNOSIS — R140 Abdominal distension (gaseous): Secondary | ICD-10-CM

## 2020-05-17 DIAGNOSIS — K219 Gastro-esophageal reflux disease without esophagitis: Secondary | ICD-10-CM | POA: Diagnosis not present

## 2020-05-17 DIAGNOSIS — K5900 Constipation, unspecified: Secondary | ICD-10-CM

## 2020-05-17 DIAGNOSIS — R112 Nausea with vomiting, unspecified: Secondary | ICD-10-CM

## 2020-05-17 LAB — CBC WITH DIFFERENTIAL/PLATELET
Abs Immature Granulocytes: 0.25 10*3/uL — ABNORMAL HIGH (ref 0.00–0.07)
Basophils Absolute: 0 10*3/uL (ref 0.0–0.1)
Basophils Relative: 0 %
Eosinophils Absolute: 0 10*3/uL (ref 0.0–0.5)
Eosinophils Relative: 0 %
HCT: 25.1 % — ABNORMAL LOW (ref 36.0–46.0)
Hemoglobin: 8.1 g/dL — ABNORMAL LOW (ref 12.0–15.0)
Immature Granulocytes: 3 %
Lymphocytes Relative: 19 %
Lymphs Abs: 1.6 10*3/uL (ref 0.7–4.0)
MCH: 29.6 pg (ref 26.0–34.0)
MCHC: 32.3 g/dL (ref 30.0–36.0)
MCV: 91.6 fL (ref 80.0–100.0)
Monocytes Absolute: 0.3 10*3/uL (ref 0.1–1.0)
Monocytes Relative: 4 %
Neutro Abs: 6.4 10*3/uL (ref 1.7–7.7)
Neutrophils Relative %: 74 %
Platelets: 362 10*3/uL (ref 150–400)
RBC: 2.74 MIL/uL — ABNORMAL LOW (ref 3.87–5.11)
RDW: 16.6 % — ABNORMAL HIGH (ref 11.5–15.5)
WBC: 8.7 10*3/uL (ref 4.0–10.5)
nRBC: 0 % (ref 0.0–0.2)

## 2020-05-17 LAB — GASTROINTESTINAL PANEL BY PCR, STOOL (REPLACES STOOL CULTURE)

## 2020-05-17 LAB — CBC
HCT: 24.3 % — ABNORMAL LOW (ref 36.0–46.0)
Hemoglobin: 7.8 g/dL — ABNORMAL LOW (ref 12.0–15.0)
MCH: 29.7 pg (ref 26.0–34.0)
MCHC: 32.1 g/dL (ref 30.0–36.0)
MCV: 92.4 fL (ref 80.0–100.0)
Platelets: 368 10*3/uL (ref 150–400)
RBC: 2.63 MIL/uL — ABNORMAL LOW (ref 3.87–5.11)
RDW: 16.7 % — ABNORMAL HIGH (ref 11.5–15.5)
WBC: 9.1 10*3/uL (ref 4.0–10.5)
nRBC: 0 % (ref 0.0–0.2)

## 2020-05-17 LAB — COMPREHENSIVE METABOLIC PANEL
ALT: 11 U/L (ref 0–44)
AST: 11 U/L — ABNORMAL LOW (ref 15–41)
Albumin: 1.4 g/dL — ABNORMAL LOW (ref 3.5–5.0)
Alkaline Phosphatase: 55 U/L (ref 38–126)
Anion gap: 5 (ref 5–15)
BUN: 18 mg/dL (ref 6–20)
CO2: 23 mmol/L (ref 22–32)
Calcium: 7.2 mg/dL — ABNORMAL LOW (ref 8.9–10.3)
Chloride: 105 mmol/L (ref 98–111)
Creatinine, Ser: 0.42 mg/dL — ABNORMAL LOW (ref 0.44–1.00)
GFR calc Af Amer: >60 mL/min (ref >60)
GFR calc non Af Amer: >60 mL/min (ref >60)
Glucose, Bld: 149 mg/dL — ABNORMAL HIGH (ref 70–99)
Potassium: 3.6 mmol/L (ref 3.5–5.1)
Sodium: 133 mmol/L — ABNORMAL LOW (ref 135–145)
Total Bilirubin: 0.4 mg/dL (ref 0.3–1.2)
Total Protein: 3.6 g/dL — ABNORMAL LOW (ref 6.5–8.1)

## 2020-05-17 LAB — MAGNESIUM: Magnesium: 1.3 mg/dL — ABNORMAL LOW (ref 1.7–2.4)

## 2020-05-17 LAB — ACTH STIMULATION, 3 TIME POINTS: Cortisol, Base: 26.3 ug/dL

## 2020-05-17 MED ORDER — PANTOPRAZOLE SODIUM 40 MG IV SOLR
40.0000 mg | INTRAVENOUS | Status: DC
  Administered 2020-05-17 – 2020-05-19 (×3): 40 mg via INTRAVENOUS
  Filled 2020-05-17 (×3): qty 40

## 2020-05-17 MED ORDER — MAGNESIUM SULFATE 4 GM/100ML IV SOLN
4.0000 g | Freq: Once | INTRAVENOUS | Status: AC
  Administered 2020-05-17: 4 g via INTRAVENOUS
  Filled 2020-05-17: qty 100

## 2020-05-17 MED ORDER — PROSOURCE PLUS PO LIQD
30.0000 mL | Freq: Every day | ORAL | Status: DC
  Administered 2020-05-17 – 2020-05-23 (×7): 30 mL via ORAL
  Filled 2020-05-17 (×7): qty 30

## 2020-05-17 MED ORDER — ADULT MULTIVITAMIN W/MINERALS CH
1.0000 | ORAL_TABLET | Freq: Every day | ORAL | Status: DC
  Administered 2020-05-17 – 2020-05-28 (×11): 1 via ORAL
  Filled 2020-05-17 (×11): qty 1

## 2020-05-17 MED ORDER — BOOST / RESOURCE BREEZE PO LIQD CUSTOM
1.0000 | Freq: Two times a day (BID) | ORAL | Status: DC
  Administered 2020-05-17: 1 via ORAL

## 2020-05-17 MED ORDER — KATE FARMS STANDARD 1.4 PO LIQD
325.0000 mL | Freq: Every day | ORAL | Status: DC
  Administered 2020-05-18 – 2020-05-20 (×2): 325 mL via ORAL
  Filled 2020-05-17 (×7): qty 325

## 2020-05-17 MED ORDER — SODIUM CHLORIDE 0.9 % IV SOLN
510.0000 mg | Freq: Once | INTRAVENOUS | Status: AC
  Administered 2020-05-17: 510 mg via INTRAVENOUS
  Filled 2020-05-17: qty 510

## 2020-05-17 MED ORDER — MELATONIN 3 MG PO TABS
3.0000 mg | ORAL_TABLET | Freq: Every evening | ORAL | Status: DC | PRN
  Administered 2020-05-18 – 2020-05-27 (×11): 3 mg via ORAL
  Filled 2020-05-17 (×11): qty 1

## 2020-05-17 NOTE — Progress Notes (Signed)
Initial Nutrition Assessment  RD working remotely.  DOCUMENTATION CODES:   Underweight  INTERVENTION:  - will order Boost Breeze BID, each supplement provides 250 kcal and 9 grams of protein. - will order Dillard [place] 1.4 po once/day, each supplement provides 455 kcal and 20 grams protein.  - will order 30 ml Prosource Plus once/day, each supplement provides 100 kcal and 15 grams protein.  - will order 1 tablet multivitamin with minerals/day. - will complete NFPE at follow-up.    NUTRITION DIAGNOSIS:   Inadequate oral intake related to acute illness, nausea, vomiting as evidenced by per patient/family report.  GOAL:   Patient will meet greater than or equal to 90% of their needs  MONITOR:   PO intake, Supplement acceptance, Labs, Weight trends  REASON FOR ASSESSMENT:   Malnutrition Screening Tool  ASSESSMENT:   39 year-old female with medical history of anxiety, chronic intestinal pseudo-obstruction, GERD, and FTT. She had struggled with losing weight until 7 years ago. She now experiences intermittent N/V and frequently feels she is unable to eat. In the ED she reported PTA she was feeling dizzy with standing, weakness, and generally feeling unwell. She is vaccinated against COVID-19.  Unable to reach patient by phone. No intakes documented since admission. Weight yesterday was 94 lb and weight is up from last month, consistent with weight from 05/04/19-11/08/19, after which time weight began to trend down.   Patient has not been seen by a [place] RD since 04/2019 at which time she met criteria for severe malnutrition related to chronic illness as evidenced by severe fat depletions and severe muscle depletions.   GI is following and saw her yesterday. Note indicates that patient has had intermittent symptoms over the past 7 years. She will do well for several months and then have periods of N/V and changes in bowel habits.  She was admitted 9/12-9/16 with N/V, FTT,  hyponatremia, and chronic intestinal pseudo-obstruction. C.diff and GI panel were negative at that time. Following that hospitalization, she began having constipation which has been ongoing until this time.   Per notes: - sepsis - volume depletion/hypotension - intractable N/V and abdominal pain with concern for celiac disease, bacterial overgrowth of small bowel, and adrenal insufficiency   Labs reviewed; Na: 133 mmol/l, creatinine: 0.42 mg/dl, Ca: 7.2 mg/dl, Mg: 1.3 mg/dl. Medications reviewed; 100 mg solu-cortef TID, 4 g IV Mg sulfate x1 run 10/2, 40 mg IV protonix/day.  IVF; D5-1/2 NS @ 125 ml/hr (510 kcal)    NUTRITION - FOCUSED PHYSICAL EXAM:  unable to complete at this time.   Diet Order:   Diet Order            DIET SOFT Room service appropriate? Yes; Fluid consistency: Thin  Diet effective now                 EDUCATION NEEDS:   No education needs have been identified at this time  Skin:  Skin Assessment: Reviewed RN Assessment  Last BM:  10/1  Height:   Ht Readings from Last 1 Encounters:  05/16/20 _0  (1.6 m)    Weight:   Wt Readings from Last 1 Encounters:  05/16/20 42.6 kg     Estimated Nutritional Needs:  Kcal:  9381-8299 kcal Protein:  85-100 grams Fluid:  >/= 2 L/day     Jarome Matin, MS, RD, LDN, CNSC Inpatient Clinical Dietitian RD pager # available in [facility]  After hours/weekend pager # available in [facility]

## 2020-05-17 NOTE — Progress Notes (Signed)
Patient's admission KUB really did not show overt obstruction.  In some ways, her clinical presentation is more suggestive of cyclical vomiting.  In any event, she is feeling better today.  In fact, she is feeling much better and is very hungry.  We have started a soft diet, but I counseled the patient regarding the need to start out with frequent, small feedings rather than full meals.  She asked about outpatient management of her constipation; despite twice daily MiraLAX plus Motegrity, she still gets periodic constipation.  I suggested that she discuss this with Dr. Michail Sermon, her primary gastroenterologist.  On exam, this is a thin female in no acute distress.  The abdomen is minimally distended, barely perceptible, but is moderately typanitic.  Active bowel sounds are present.  No succussion splash.  No mass-effect or tenderness  Labs show a low normal potassium of 3.6 which is holding steady, a slightly low magnesium of 1.3 and stable hemoglobin with normal white count.  BUN has come down nicely with hydration, from 37 yesterday to 18 today.  Post hydration albumin of 1.4 is quite concerning.  Recommendation:  1.  The patient may benefit from low-dose magnesium supplementation.  She is currently getting IV supplementation in the hospital.  If oral supplementation does not worsen her bloating (magnesium sometimes causes gas), it might help with her constipation issue while helping to maintain a normal magnesium level.  2.  The patient may also benefit from outpatient potassium supplementation to help keep her total body stores of potassium (as reflected by her serum potassium) in a somewhat higher range.  This might help muscular function, including that of her lower GI tract.  Cleotis Nipper, M.D. Pager [phone] If no answer or after 5 PM call [phone]

## 2020-05-17 NOTE — Progress Notes (Signed)
Unable to send c-dif sample due to stool being formed.  Dr. Grandville Silos made aware.

## 2020-05-17 NOTE — Progress Notes (Signed)
PROGRESS NOTE    Stacy [name]  GYF:[id number] DOB: December 01, 1980 DOA: 05/16/2020 PCP: [name], MD    Chief Complaint  Patient presents with  . Hypotension    Brief Narrative:  HPI per Dr. Benny Lennert The patient is a 39 yr old woman. She states that she used to struggle with weight loss. However, about 7 years ago she began losing weight without trying. She had intermittent nausea and vomiting and frequently not being able to eat. She continues to lose weight. She says it is like a nightmare. Recently the patient was discharged from this institution after a stay for intestinal pseudo-obstruction. Today the patient's pain was relieved with BM.   The patient carries a past medical history significant for anxiety, chronic intestinal pseudo-obstruction, GERD, and FTT.  In the ED she was found to be hypotensive with tachycardia. Temperature, respiratory rate, and O2 saturations were normal. Creatinine is 0.45. Fe 23, TIBC 85, Ferritin is 147, albumin is 1.5, LFT's are normal. Lactic Acid is  2.0 upon presentation, but came down to 1.6 with hydration. WBC is 14.1 (increased bands and neutrophils), and Hemoglobin is 8.7. This is down from 9.2 on 05/01/2020. He is negative for COVID.   The patient denies fevers, chills, cough, shortness of breath, wheezes, rashes, headaches, or neurological changes. She admits to dizziness with standing, weakness, and feeling generally unwell.  [facility] were consulted to admit the patient for further evaluation and treatment. She will be admitted to a telemetry bed. I have ordered celiac work up as well as a cosyntropin stim test. EDP has discussed the patient with Dr. [name].  The patient remains hypotensive despite 3 L fluid. Will add hydrocortisone.  Assessment & Plan:   Principal Problem:   Volume depletion Active Problems:   Underweight   Iron deficiency anemia   Chronic intestinal pseudo-obstruction   GERD (gastroesophageal reflux  disease)   Abdominal pain with vomiting   Failure to thrive in adult   Nausea and vomiting   Sepsis ([place])   Hypomagnesemia   Constipation  1 hypotension/volume depletion/nausea/emesis/diarrhea/sepsis rule out Patient presented noted to be hypotensive, volume depleted, nauseous, emesis and diarrhea.  Concern for sepsis as patient noted to be hypotensive, with a leukocytosis and tachycardia, lactic acidosis which responded to IV fluids.  Urinalysis done concerning for UTI.  Abdominal films unremarkable.  Concern for intra-abdominal infection as such patient placed empirically on IV Zosyn.  Patient also placed on IV hydrocortisone due to persistent blood pressure despite 3 L of IV fluids.  Patient noted to have more formed stools today.  Patient stated had an episode of diarrhea on presentation to the ED.  GI pathogen panel negative.  C. difficile PCR pending.  Per RN patient with formed stools now and as such stool for C. difficile will be rejected.  Patient currently afebrile.  Single blood culture pending.  Urine cultures pending.  Patient started on IV Solu-Cortef and as such unable to do the cosyntropin stim test.  Continue IV fluids, IV Solu-Cortef.  Diet has been advanced to a soft diet per GI.  GI following and appreciate input and recommendations.  2.  Hypomagnesemia Likely secondary to GI losses.  Magnesium sulfate 4 g IV x1.  Repeat labs in the morning.  May need oral supplementation on discharge.  3.  Volume depletion/hypotension Likely secondary to ongoing GI losses.  Patient received 3 L of IV fluid however still noted to be hypotensive.  Patient was placed on IV Solu-Cortef.  Patient pancultured.  Due to IV Solu-Cortef cosyntropin stim test will likely not be helpful and as such we will DC test which was not done this morning.  Continue IV fluids, IV Solu-Cortef.  Placed on TED hose.  Follow.  4.  Gastroesophageal reflux disease PPI.  5.  Nausea, vomiting, weight loss, abdominal  pain Patient has had an extensive work-up in the outpatient setting and during last hospitalization.  Concern for celiac disease and bacterial overgrowth of small bowel versus adrenal insufficiency.  Patient noted during last hospitalization to have a tissue transglutaminase study which was negative.  Antigliadin antibodies negative.  Improved clinically.  Asking for food.  Tolerating soft diet.  Patient being followed by GI.  Antiemetics as needed supportive care.  Follow.  6.  Probable UTI Urine cultures pending.  Patient on IV Zosyn.  Follow.  7.  Iron deficiency anemia Anemia panel with iron level of 23, TIBC of 85, folate of 6, ferritin 147, B12 514.  Hemoglobin currently at 8.1.  Will order dose of IV Feraheme x1.  Follow H&H.  Transfusion threshold hemoglobin < 7.  GI following.  8.  Chronic constipation We will need outpatient follow-up with GI her primary gastroenterologist.    DVT prophylaxis: Lovenox Code Status: Full Family Communication: Updated patient.  No family at bedside. Disposition:   Status is: Inpatient    Dispo: The patient is from: Home              Anticipated d/c is to: Home              Anticipated d/c date is: 2 to 3 days              Patient currently with electrolyte abnormalities being repleted, hypotension with orthostasis, on IV antibiotics, probable UTI, not stable for discharge.       Consultants:   Gastroenterology: Dr. [name] 05/16/2020  Procedures:   Abdominal x-ray 05/16/2020    Antimicrobials:   IV Zosyn 05/16/2020   Subjective: Patient sitting up in bed.  Stating she felt better.  Seems to be tolerating soft diet at bedside.  Denies any further nausea or vomiting.  Stated had a bout of diarrhea in the ED and none since.  Denies any significant abdominal pain.  No chest pain.  No shortness of breath.  Patient noted to be orthostatic per RN.  Objective: Vitals:   05/17/20 1155 05/17/20 1413 05/17/20 1624 05/17/20 1625  BP: 98/69  98/68 (!) 89/62 92/60  Pulse: 88  (!) 102 99  Resp: (!) 21 20 (!) 23   Temp: 98.3 F (36.8 C) 98.7 F (37.1 C) 98.5 F (36.9 C)   TempSrc: Oral Oral    SpO2: 98% 100% 99%   Weight:      Height:        Intake/Output Summary (Last 24 hours) at 05/17/2020 1805 Last data filed at 05/17/2020 1628 Gross per 24 hour  Intake 1965 ml  Output 300 ml  Net 1665 ml   Filed Weights   05/16/20 0841  Weight: 42.6 kg    Examination:  General exam: Appears calm and comfortable.  Frail.  Thin female. Respiratory system: Clear to auscultation. Respiratory effort normal. Cardiovascular system: S1 & S2 heard, RRR. No JVD, murmurs, rubs, gallops or clicks. No pedal edema. Gastrointestinal system: Abdomen is nondistended, soft and nontender. No organomegaly or masses felt. Normal bowel sounds heard. Central nervous system: Alert and oriented. No focal neurological deficits. Extremities: Symmetric 5 x 5 power.  Skin: No rashes, lesions or ulcers Psychiatry: Judgement and insight appear normal. Mood & affect appropriate.     Data Reviewed: I have personally reviewed following labs and imaging studies  CBC: Recent Labs  Lab 05/16/20 0856 05/16/20 1218 05/17/20 0810 05/17/20 1418  WBC 14.1* 12.4* 9.1 8.7  NEUTROABS 11.9*  --   --  6.4  HGB 8.7* 7.5* 7.8* 8.1*  HCT 26.9* 23.4* 24.3* 25.1*  MCV 91.8 91.1 92.4 91.6  PLT 334 300 368 161    Basic Metabolic Panel: Recent Labs  Lab 05/16/20 0856 05/16/20 1218 05/17/20 0810  NA 136  --  133*  K 3.6  --  3.6  CL 104  --  105  CO2 21*  --  23  GLUCOSE 94  --  149*  BUN 37*  --  18  CREATININE 0.45 0.35* 0.42*  CALCIUM 7.3*  --  7.2*  MG  --   --  1.3*    GFR: Estimated Creatinine Clearance: 62.9 mL/min (A) (by C-G formula based on SCr of 0.42 mg/dL (L)).  Liver Function Tests: Recent Labs  Lab 05/16/20 0856 05/17/20 0810  AST 16 11*  ALT 11 11  ALKPHOS 50 55  BILITOT 0.5 0.4  PROT 4.0* 3.6*  ALBUMIN 1.5* 1.4*     CBG: No results for input(s): GLUCAP in the last 168 hours.   Recent Results (from the past 240 hour(s))  Respiratory Panel by RT PCR (Flu A&B, Covid) - Nasopharyngeal Swab     Status: None   Collection Time: 05/16/20 10:54 AM   Specimen: Nasopharyngeal Swab  Result Value Ref Range Status   SARS Coronavirus 2 by RT PCR NEGATIVE NEGATIVE Final    Comment: (NOTE) SARS-CoV-2 target nucleic acids are NOT DETECTED.  The SARS-CoV-2 RNA is generally detectable in upper respiratoy specimens during the acute phase of infection. The lowest concentration of SARS-CoV-2 viral copies this assay can detect is 131 copies/mL. A negative result does not preclude SARS-Cov-2 infection and should not be used as the sole basis for treatment or other patient management decisions. A negative result may occur with  improper specimen collection/handling, submission of specimen other than nasopharyngeal swab, presence of viral mutation(s) within the areas targeted by this assay, and inadequate number of viral copies (<131 copies/mL). A negative result must be combined with clinical observations, patient history, and epidemiological information. The expected result is Negative.  Fact Sheet for Patients:  PinkCheek.be  Fact Sheet for Healthcare Providers:  GravelBags.it  This test is no t yet approved or cleared by the [place] FDA and  has been authorized for detection and/or diagnosis of SARS-CoV-2 by FDA under an Emergency Use Authorization (EUA). This EUA will remain  in effect (meaning this test can be used) for the duration of the COVID-19 declaration under Section 564(b)(1) of the Act, 21 U.S.C. section 360bbb-3(b)(1), unless the authorization is terminated or revoked sooner.     Influenza A by PCR NEGATIVE NEGATIVE Final   Influenza B by PCR NEGATIVE NEGATIVE Final    Comment: (NOTE) The Xpert Xpress SARS-CoV-2/FLU/RSV assay  is intended as an aid in  the diagnosis of influenza from Nasopharyngeal swab specimens and  should not be used as a sole basis for treatment. Nasal washings and  aspirates are unacceptable for Xpert Xpress SARS-CoV-2/FLU/RSV  testing.  Fact Sheet for Patients: PinkCheek.be  Fact Sheet for Healthcare Providers: GravelBags.it  This test is not yet approved or cleared by the [place] and  has been authorized for detection and/or diagnosis of SARS-CoV-2 by  FDA under an Emergency Use Authorization (EUA). This EUA will remain  in effect (meaning this test can be used) for the duration of the  Covid-19 declaration under Section 564(b)(1) of the Act, 21  U.S.C. section 360bbb-3(b)(1), unless the authorization is  terminated or revoked. Performed at [facility], [place] [address] [place], [place] [address]   Culture, blood (single)     Status: None (Preliminary result)   Collection Time: 05/16/20 11:04 AM   Specimen: BLOOD  Result Value Ref Range Status   Specimen Description   Final    BLOOD LEFT ANTECUBITAL Performed at [place] [address] [place], [place] [address]    Special Requests   Final    BOTTLES DRAWN AEROBIC AND ANAEROBIC Blood Culture adequate volume Performed at [place] [address] [place], [place] [address]    Culture   Final    NO GROWTH < 24 HOURS Performed at [place] [address] [place], [place] [address]    Report Status PENDING  Incomplete  Gastrointestinal Panel by PCR , Stool     Status: None   Collection Time: 05/16/20  5:14 PM   Specimen: Stool  Result Value Ref Range Status   Campylobacter species NOT DETECTED NOT DETECTED Final   Plesimonas shigelloides NOT DETECTED NOT DETECTED Final   Salmonella species NOT DETECTED NOT DETECTED Final   Yersinia enterocolitica NOT DETECTED NOT DETECTED Final    Vibrio species NOT DETECTED NOT DETECTED Final   Vibrio cholerae NOT DETECTED NOT DETECTED Final   Enteroaggregative E coli (EAEC) NOT DETECTED NOT DETECTED Final   Enteropathogenic E coli (EPEC) NOT DETECTED NOT DETECTED Final   Enterotoxigenic E coli (ETEC) NOT DETECTED NOT DETECTED Final   Shiga like toxin producing E coli (STEC) NOT DETECTED NOT DETECTED Final   Shigella/Enteroinvasive E coli (EIEC) NOT DETECTED NOT DETECTED Final   Cryptosporidium NOT DETECTED NOT DETECTED Final   Cyclospora cayetanensis NOT DETECTED NOT DETECTED Final   Entamoeba histolytica NOT DETECTED NOT DETECTED Final   Giardia lamblia NOT DETECTED NOT DETECTED Final   Adenovirus F40/41 NOT DETECTED NOT DETECTED Final   Astrovirus NOT DETECTED NOT DETECTED Final   Norovirus GI/GII NOT DETECTED NOT DETECTED Final   Rotavirus A NOT DETECTED NOT DETECTED Final   Sapovirus (I, II, IV, and V) NOT DETECTED NOT DETECTED Final    Comment: Performed at [facility], [address], [place], [place] [address]         Radiology Studies: DG Abd 2 Views  Result Date: 05/16/2020 CLINICAL DATA:  Diarrhea and hypotension EXAM: X-RAY ABDOMEN 2 VIEWS COMPARISON:  04/26/2020 FINDINGS: Lung bases are clear. No free air beneath the diaphragm. 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 Mild increased small bowel gas, decreased distension compared with radiograph from 04/26/2020. Scattered colon gas is present. No radiopaque calculi. IMPRESSION: Mild increased small bowel gas without definitive obstructive pattern. Findings could be secondary to ileus or enteritis; degree of small bowel distension is improved since comparison radiograph from 04/26/2020. Electronically Signed   By: Donavan Foil M.D.   On: 05/16/2020 17:27        Scheduled Meds: . (feeding supplement) PROSource Plus  30 mL Oral Daily  . cosyntropin  0.25 mg Intravenous Once  . enoxaparin (LOVENOX) injection  30 mg Subcutaneous Q24H  . feeding  supplement  1 Container Oral BID  BM  . feeding supplement (KATE FARMS STANDARD 1.4)  325 mL Oral Daily  . hydrocortisone sod succinate (SOLU-CORTEF) inj  100 mg Intravenous Q8H  . multivitamin with minerals  1 tablet Oral Daily  . pantoprazole (PROTONIX) IV  40 mg Intravenous Q24H   Continuous Infusions: . dextrose 5 % and 0.45% NaCl 125 mL/hr at 05/17/20 1628  . piperacillin-tazobactam (ZOSYN)  IV 3.375 g (05/17/20 1128)     LOS: 1 day    Time spent: 40 minutes    Irine Seal, MD [facility]   To contact the attending provider between 7A-7P or the covering provider during after hours 7P-7A, please log into the web site [email or web address] and access using universal [place] password for that web site. If you do not have the password, please call the hospital operator.  05/17/2020, 6:05 PM

## 2020-05-18 DIAGNOSIS — D509 Iron deficiency anemia, unspecified: Secondary | ICD-10-CM | POA: Diagnosis not present

## 2020-05-18 DIAGNOSIS — E869 Volume depletion, unspecified: Secondary | ICD-10-CM | POA: Diagnosis not present

## 2020-05-18 DIAGNOSIS — K219 Gastro-esophageal reflux disease without esophagitis: Secondary | ICD-10-CM | POA: Diagnosis not present

## 2020-05-18 DIAGNOSIS — A419 Sepsis, unspecified organism: Secondary | ICD-10-CM | POA: Diagnosis not present

## 2020-05-18 DIAGNOSIS — E876 Hypokalemia: Secondary | ICD-10-CM

## 2020-05-18 DIAGNOSIS — E43 Unspecified severe protein-calorie malnutrition: Secondary | ICD-10-CM

## 2020-05-18 LAB — COMPREHENSIVE METABOLIC PANEL
ALT: 9 U/L (ref 0–44)
AST: 10 U/L — ABNORMAL LOW (ref 15–41)
Albumin: 1.4 g/dL — ABNORMAL LOW (ref 3.5–5.0)
Alkaline Phosphatase: 57 U/L (ref 38–126)
Anion gap: 9 (ref 5–15)
BUN: 12 mg/dL (ref 6–20)
CO2: 21 mmol/L — ABNORMAL LOW (ref 22–32)
Calcium: 7.6 mg/dL — ABNORMAL LOW (ref 8.9–10.3)
Chloride: 104 mmol/L (ref 98–111)
Creatinine, Ser: 0.33 mg/dL — ABNORMAL LOW (ref 0.44–1.00)
GFR calc Af Amer: >60 mL/min (ref >60)
GFR calc non Af Amer: >60 mL/min (ref >60)
Glucose, Bld: 209 mg/dL — ABNORMAL HIGH (ref 70–99)
Potassium: 3.2 mmol/L — ABNORMAL LOW (ref 3.5–5.1)
Sodium: 134 mmol/L — ABNORMAL LOW (ref 135–145)
Total Bilirubin: 0.7 mg/dL (ref 0.3–1.2)
Total Protein: 3.6 g/dL — ABNORMAL LOW (ref 6.5–8.1)

## 2020-05-18 LAB — CBC WITH DIFFERENTIAL/PLATELET
Abs Immature Granulocytes: 0.53 10*3/uL — ABNORMAL HIGH (ref 0.00–0.07)
Basophils Absolute: 0 10*3/uL (ref 0.0–0.1)
Basophils Relative: 0 %
Eosinophils Absolute: 0 10*3/uL (ref 0.0–0.5)
Eosinophils Relative: 0 %
HCT: 25 % — ABNORMAL LOW (ref 36.0–46.0)
Hemoglobin: 8.2 g/dL — ABNORMAL LOW (ref 12.0–15.0)
Immature Granulocytes: 6 %
Lymphocytes Relative: 17 %
Lymphs Abs: 1.6 10*3/uL (ref 0.7–4.0)
MCH: 29.8 pg (ref 26.0–34.0)
MCHC: 32.8 g/dL (ref 30.0–36.0)
MCV: 90.9 fL (ref 80.0–100.0)
Monocytes Absolute: 0.5 10*3/uL (ref 0.1–1.0)
Monocytes Relative: 5 %
Neutro Abs: 6.9 10*3/uL (ref 1.7–7.7)
Neutrophils Relative %: 72 %
Platelets: 326 10*3/uL (ref 150–400)
RBC: 2.75 MIL/uL — ABNORMAL LOW (ref 3.87–5.11)
RDW: 16.2 % — ABNORMAL HIGH (ref 11.5–15.5)
WBC: 9.5 10*3/uL (ref 4.0–10.5)
nRBC: 0 % (ref 0.0–0.2)

## 2020-05-18 LAB — TISSUE TRANSGLUTAMINASE, IGA: Tissue Transglutaminase Ab, IgA: <2 U/mL (ref 0–3)

## 2020-05-18 LAB — PHOSPHORUS: Phosphorus: 3.2 mg/dL (ref 2.5–4.6)

## 2020-05-18 LAB — MAGNESIUM: Magnesium: 2 mg/dL (ref 1.7–2.4)

## 2020-05-18 MED ORDER — ALBUMIN HUMAN 25 % IV SOLN
25.0000 g | Freq: Four times a day (QID) | INTRAVENOUS | Status: AC
  Administered 2020-05-18 – 2020-05-19 (×4): 25 g via INTRAVENOUS
  Filled 2020-05-18 (×4): qty 100

## 2020-05-18 MED ORDER — SODIUM CHLORIDE 0.9 % IV SOLN: INTRAVENOUS | Status: DC

## 2020-05-18 MED ORDER — POTASSIUM CHLORIDE CRYS ER 20 MEQ PO TBCR
40.0000 meq | EXTENDED_RELEASE_TABLET | ORAL | Status: AC
  Administered 2020-05-18 (×2): 40 meq via ORAL
  Filled 2020-05-18 (×2): qty 2

## 2020-05-18 MED ORDER — HYDROCORTISONE NA SUCCINATE PF 100 MG IJ SOLR
50.0000 mg | Freq: Three times a day (TID) | INTRAMUSCULAR | Status: DC
  Administered 2020-05-18: 50 mg via INTRAVENOUS
  Filled 2020-05-18: qty 2

## 2020-05-18 MED ORDER — SODIUM CHLORIDE 0.9 % IV BOLUS
1000.0000 mL | Freq: Once | INTRAVENOUS | Status: AC
  Administered 2020-05-18: 1000 mL via INTRAVENOUS

## 2020-05-18 NOTE — Progress Notes (Signed)
The patient continues to do well with soft diet.  No nausea, no vomiting.  She is eating frequent small amounts as directed.  Detailed discussion with patient and husband (at bedside) regarding principles of gastric physiology and cyclical vomiting, and the notion of a gastroparesis diet which is helpful at times of symptomatic exacerbation.  On exam, the patient appears stronger and more upbeat than yesterday.  The abdomen is nondistended and is less diffusely tender phonetic than yesterday.  Bowel sounds are present, no succussion splash present, no evident tenderness.  Okay to gradually advance diet from here.  If the patient continues to do well, I think discharge from the GI standpoint will be possible tomorrow, although there may be other factors which would keep her here.  Stacy [name], M.D. Pager [phone] If no answer or after 5 PM call [phone]

## 2020-05-18 NOTE — Progress Notes (Signed)
OT Cancellation Note  Patient Details Name: Stacy [name] MRN: [id number] DOB: 10-06-1980   Cancelled Treatment:    Reason Eval/Treat Not Completed: Patient not medically ready (hypotensive) Will check back 05/19/20  Jeri Modena 05/18/2020, 9:46 AM

## 2020-05-18 NOTE — Progress Notes (Addendum)
Pt assisted to chair at bedside. Pt was able to stand and pivot to chair without getting dizzy or lightheaded. Will continue to monitor patient.    >>Pt was able to sit in chair for 2h.

## 2020-05-18 NOTE — Progress Notes (Signed)
PROGRESS NOTE    Stacy [name]  KDT:[id number] DOB: Dec 30, 1980 DOA: 05/16/2020 PCP: [name], MD    Chief Complaint  Patient presents with  . Hypotension    Brief Narrative:  HPI per Dr. Benny Lennert The patient is a 39 yr old woman. She states that she used to struggle with weight loss. However, about 7 years ago she began losing weight without trying. She had intermittent nausea and vomiting and frequently not being able to eat. She continues to lose weight. She says it is like a nightmare. Recently the patient was discharged from this institution after a stay for intestinal pseudo-obstruction. Today the patient's pain was relieved with BM.   The patient carries a past medical history significant for anxiety, chronic intestinal pseudo-obstruction, GERD, and FTT.  In the ED she was found to be hypotensive with tachycardia. Temperature, respiratory rate, and O2 saturations were normal. Creatinine is 0.45. Fe 23, TIBC 85, Ferritin is 147, albumin is 1.5, LFT's are normal. Lactic Acid is  2.0 upon presentation, but came down to 1.6 with hydration. WBC is 14.1 (increased bands and neutrophils), and Hemoglobin is 8.7. This is down from 9.2 on 05/01/2020. He is negative for COVID.   The patient denies fevers, chills, cough, shortness of breath, wheezes, rashes, headaches, or neurological changes. She admits to dizziness with standing, weakness, and feeling generally unwell.  [facility] were consulted to admit the patient for further evaluation and treatment. She will be admitted to a telemetry bed. I have ordered celiac work up as well as a cosyntropin stim test. EDP has discussed the patient with Dr. [name].  The patient remains hypotensive despite 3 L fluid. Will add hydrocortisone.  Assessment & Plan:   Principal Problem:   Volume depletion Active Problems:   Underweight   Iron deficiency anemia   Chronic intestinal pseudo-obstruction   GERD (gastroesophageal reflux  disease)   Abdominal pain with vomiting   Failure to thrive in adult   Nausea and vomiting   Sepsis ([place])   Hypomagnesemia   Constipation  1 hypotension/volume depletion/nausea/emesis/diarrhea/sepsis ruled out Patient presented noted to be hypotensive, volume depleted, nauseous, emesis and diarrhea.  Concern for sepsis as patient noted to be hypotensive, with a leukocytosis and tachycardia, lactic acidosis which responded to IV fluids.  Urinalysis done concerning for UTI.  Abdominal films unremarkable.  Concern for intra-abdominal infection as such patient placed empirically on IV Zosyn.  Patient also placed on IV hydrocortisone due to persistent blood pressure despite 3 L of IV fluids.  Patient noted to have more formed stools.  Patient stated had an episode of diarrhea on presentation to the ED.  GI pathogen panel negative.  C. difficile PCR canceled as patient with formed stools.  Patient currently afebrile.  Single blood culture pending.  Urine cultures with Proteus mirabilis and E. coli. Patient started on IV Solu-Cortef and as such unable to do the cosyntropin stim test.  Continue IV fluids, discontinue IV Solu-Cortef.  Diet has been advanced to a soft diet per GI which patient tolerated and further advanced to regular diet.  GI following and appreciate input and recommendations.  2.  Hypomagnesemia/hypokalemia Likely secondary to GI losses.  Repleted.  Magnesium at 2.0.  Potassium at 3.2.  K. Dur 40 mEq p.o. every 4 hours x2 doses.  Repeat labs in the morning.  3.  Orthostatic hypotension/volume depletion Likely secondary to ongoing GI losses.  Patient received 3 L of IV fluid however still noted to be hypotensive.  Patient was placed on IV Solu-Cortef.  Patient pancultured with urine cultures positive for Proteus mirabilis and E. coli UTI.  Blood cultures with no growth to date.  Patient symptomatic with orthostasis..  Due to IV Solu-Cortef cosyntropin stim test will likely not be helpful and  as such test has been discontinued.  Given 1 L fluid bolus.  Continue maintenance IV fluids.  IV albumin every 6 hours x1 day.  TED hose.  Will discontinue IV Solu-Cortef.  Repeat orthostatics in the morning.  If patient is still orthostatic and symptomatic will place on midodrine.  Will likely need outpatient work-up for possible adrenal insufficiency.  Follow.   4.  Gastroesophageal reflux disease Continue IV PPI.  Could likely transition to oral PPI tomorrow.   5.  Nausea, vomiting, weight loss, abdominal pain Patient has had an extensive work-up in the outpatient setting and during last hospitalization.  Concern for celiac disease and bacterial overgrowth of small bowel versus adrenal insufficiency.  Patient noted during last hospitalization to have a tissue transglutaminase study which was negative.  Antigliadin antibodies negative.  Improved clinically.  Tolerated soft diet and diet has been advanced to regular diet per GI.  Per GI clinical presentation suggestive of cyclical vomiting syndrome.  GI discussed with patient and husband about principles of gastric physiology and cyclical vomiting in notion of a gastroparesis diet which is helpful at times of symptomatic exacerbation.  Continue antiemetics.  Supportive care.  Outpatient follow-up with GI.   6.  Proteus mirabilis/E. coli UTI Urine cultures with Proteus mirabilis and E. coli.  Sensitivities pending.  Continue IV Zosyn.  Follow.   7.  Iron deficiency anemia Anemia panel with iron level of 23, TIBC of 85, folate of 6, ferritin 147, B12 514.  Hemoglobin currently stable at 8.2. Status post IV Feraheme x1.  Transfusion threshold hemoglobin <7.  Outpatient follow-up with hematologist as previously scheduled.   8.  Chronic constipation We will need outpatient follow-up with GI her primary gastroenterologist.  9.  Severe protein calorie malnutrition Likely secondary to GI symptoms.  Patient noted to have an albumin level of 1.4.  Patient  also noted to be orthostatic.  IV albumin every 6 hours x1 day.  Patient currently advanced to a regular diet.  Continue nutritional supplementation.    DVT prophylaxis: Lovenox Code Status: Full Family Communication: Updated patient.  Updated husband at bedside.  Disposition:   Status is: Inpatient    Dispo: The patient is from: Home              Anticipated d/c is to: Home              Anticipated d/c date is: 2 to 3 days              Patient currently with electrolyte abnormalities being repleted, symptomatic orthostatic hypotension, on IV antibiotics, UTI with cultures and sensitivities pending.  Not stable for discharge.        Consultants:   Gastroenterology: Dr. [name] 05/16/2020  Procedures:   Abdominal x-ray 05/16/2020    Antimicrobials:   IV Zosyn 05/16/2020   Subjective: Patient sitting up in bed.  Denies any further nausea or vomiting.  No diarrhea.  Feeling better.  Tolerated soft diet.  Diet has been advanced to a regular diet per gastroenterology.  Denies any chest pain or shortness of breath.  Patient noted still to be significantly orthostatic and symptomatic per RN and per patient this morning.   Objective: Vitals:  05/18/20 0507 05/18/20 0909 05/18/20 1025 05/18/20 1207  BP: 104/76 97/61 100/68 107/76  Pulse: 79 (!) 107 98 90  Resp: 16 18 14 18   Temp: (!) 97.5 F (36.4 C) 98.5 F (36.9 C) (!) 97.4 F (36.3 C) 98.2 F (36.8 C)  TempSrc:    Oral  SpO2: 95% 97% 97% 98%  Weight:      Height:        Intake/Output Summary (Last 24 hours) at 05/18/2020 1334 Last data filed at 05/17/2020 1628 Gross per 24 hour  Intake 1365 ml  Output --  Net 1365 ml   Filed Weights   05/16/20 0841  Weight: 42.6 kg    Examination:  General exam: Frail.  Thin female.  No acute distress.  Respiratory system: CTA B.  No wheezes, no crackles, no rhonchi.  Normal respiratory effort.  Cardiovascular system: Regular rate rhythm no murmurs rubs or gallops.  No  JVD.  No lower extremity edema.  Gastrointestinal system: Abdomen is soft, nontender, nondistended, positive bowel sounds.  No rebound.  No guarding.  Central nervous system: Alert and oriented. No focal neurological deficits. Extremities: Symmetric 5 x 5 power. Skin: No rashes, lesions or ulcers Psychiatry: Judgement and insight appear normal. Mood & affect appropriate.     Data Reviewed: I have personally reviewed following labs and imaging studies  CBC: Recent Labs  Lab 05/16/20 0856 05/16/20 1218 05/17/20 0810 05/17/20 1418 05/18/20 0556  WBC 14.1* 12.4* 9.1 8.7 9.5  NEUTROABS 11.9*  --   --  6.4 6.9  HGB 8.7* 7.5* 7.8* 8.1* 8.2*  HCT 26.9* 23.4* 24.3* 25.1* 25.0*  MCV 91.8 91.1 92.4 91.6 90.9  PLT 334 300 368 362 628    Basic Metabolic Panel: Recent Labs  Lab 05/16/20 0856 05/16/20 1218 05/17/20 0810 05/18/20 0556  NA 136  --  133* 134*  K 3.6  --  3.6 3.2*  CL 104  --  105 104  CO2 21*  --  23 21*  GLUCOSE 94  --  149* 209*  BUN 37*  --  18 12  CREATININE 0.45 0.35* 0.42* 0.33*  CALCIUM 7.3*  --  7.2* 7.6*  MG  --   --  1.3* 2.0  PHOS  --   --   --  3.2    GFR: Estimated Creatinine Clearance: 62.9 mL/min (A) (by C-G formula based on SCr of 0.33 mg/dL (L)).  Liver Function Tests: Recent Labs  Lab 05/16/20 0856 05/17/20 0810 05/18/20 0556  AST 16 11* 10*  ALT 11 11 9   ALKPHOS 50 55 57  BILITOT 0.5 0.4 0.7  PROT 4.0* 3.6* 3.6*  ALBUMIN 1.5* 1.4* 1.4*    CBG: No results for input(s): GLUCAP in the last 168 hours.   Recent Results (from the past 240 hour(s))  Respiratory Panel by RT PCR (Flu A&B, Covid) - Nasopharyngeal Swab     Status: None   Collection Time: 05/16/20 10:54 AM   Specimen: Nasopharyngeal Swab  Result Value Ref Range Status   SARS Coronavirus 2 by RT PCR NEGATIVE NEGATIVE Final    Comment: (NOTE) SARS-CoV-2 target nucleic acids are NOT DETECTED.  The SARS-CoV-2 RNA is generally detectable in upper respiratoy specimens  during the acute phase of infection. The lowest concentration of SARS-CoV-2 viral copies this assay can detect is 131 copies/mL. A negative result does not preclude SARS-Cov-2 infection and should not be used as the sole basis for treatment or other patient management decisions. A negative result  may occur with  improper specimen collection/handling, submission of specimen other than nasopharyngeal swab, presence of viral mutation(s) within the areas targeted by this assay, and inadequate number of viral copies (<131 copies/mL). A negative result must be combined with clinical observations, patient history, and epidemiological information. The expected result is Negative.  Fact Sheet for Patients:  PinkCheek.be  Fact Sheet for Healthcare Providers:  GravelBags.it  This test is no t yet approved or cleared by the [place] FDA and  has been authorized for detection and/or diagnosis of SARS-CoV-2 by FDA under an Emergency Use Authorization (EUA). This EUA will remain  in effect (meaning this test can be used) for the duration of the COVID-19 declaration under Section 564(b)(1) of the Act, 21 U.S.C. section 360bbb-3(b)(1), unless the authorization is terminated or revoked sooner.     Influenza A by PCR NEGATIVE NEGATIVE Final   Influenza B by PCR NEGATIVE NEGATIVE Final    Comment: (NOTE) The Xpert Xpress SARS-CoV-2/FLU/RSV assay is intended as an aid in  the diagnosis of influenza from Nasopharyngeal swab specimens and  should not be used as a sole basis for treatment. Nasal washings and  aspirates are unacceptable for Xpert Xpress SARS-CoV-2/FLU/RSV  testing.  Fact Sheet for Patients: PinkCheek.be  Fact Sheet for Healthcare Providers: GravelBags.it  This test is not yet approved or cleared by the [place] FDA and  has been authorized for detection and/or  diagnosis of SARS-CoV-2 by  FDA under an Emergency Use Authorization (EUA). This EUA will remain  in effect (meaning this test can be used) for the duration of the  Covid-19 declaration under Section 564(b)(1) of the Act, 21  U.S.C. section 360bbb-3(b)(1), unless the authorization is  terminated or revoked. Performed at [facility], [place] [address], [place], [place] [address]   Culture, blood (single)     Status: None (Preliminary result)   Collection Time: 05/16/20 11:04 AM   Specimen: BLOOD  Result Value Ref Range Status   Specimen Description   Final    BLOOD LEFT ANTECUBITAL Performed at [facility] [address], [place], [place] [address]    Special Requests   Final    BOTTLES DRAWN AEROBIC AND ANAEROBIC Blood Culture adequate volume Performed at [facility] [address] [place], [place] [address]    Culture   Final    NO GROWTH 2 DAYS Performed at [facility] [address] [place], [place] [address]    Report Status PENDING  Incomplete  Urine culture     Status: Abnormal (Preliminary result)   Collection Time: 05/16/20  5:14 PM   Specimen: Urine, Random  Result Value Ref Range Status   Specimen Description   Final    URINE, RANDOM Performed at [facility] [address], [place], [place] [address]    Special Requests   Final    NONE Performed at [facility], [place] [address], [place], [place] [address]    Culture (A)  Final    >=100,000 COLONIES/mL PROTEUS MIRABILIS >=100,000 COLONIES/mL ESCHERICHIA COLI SUSCEPTIBILITIES TO FOLLOW Performed at [facility] [address], [place],  [address]    Report Status PENDING  Incomplete  Gastrointestinal Panel by PCR , Stool     Status: None   Collection Time: 05/16/20  5:14 PM   Specimen: Stool  Result Value Ref Range Status   Campylobacter species NOT DETECTED NOT DETECTED Final   Plesimonas  shigelloides NOT DETECTED NOT DETECTED Final   Salmonella species NOT DETECTED NOT DETECTED Final   Yersinia enterocolitica NOT DETECTED NOT DETECTED Final   Vibrio species NOT DETECTED NOT DETECTED Final   Vibrio cholerae NOT DETECTED NOT DETECTED Final   Enteroaggregative E coli (EAEC) NOT DETECTED NOT DETECTED Final   Enteropathogenic E coli (EPEC) NOT DETECTED NOT DETECTED Final   Enterotoxigenic E coli (ETEC) NOT DETECTED NOT DETECTED Final   Shiga like toxin producing E coli (STEC) NOT DETECTED NOT DETECTED Final   Shigella/Enteroinvasive E coli (EIEC) NOT DETECTED NOT DETECTED Final   Cryptosporidium NOT DETECTED NOT DETECTED Final   Cyclospora cayetanensis NOT DETECTED NOT DETECTED Final   Entamoeba histolytica NOT DETECTED NOT DETECTED Final   Giardia lamblia NOT DETECTED NOT DETECTED Final   Adenovirus F40/41 NOT DETECTED NOT DETECTED Final   Astrovirus NOT DETECTED NOT DETECTED Final   Norovirus GI/GII NOT DETECTED NOT DETECTED Final   Rotavirus A NOT DETECTED NOT DETECTED Final   Sapovirus (I, II, IV, and V) NOT DETECTED NOT DETECTED Final    Comment: Performed at [facility], [address] [place], [place] [address]         Radiology Studies: DG Abd 2 Views  Result Date: 05/16/2020 CLINICAL DATA:  Diarrhea and hypotension EXAM: X-RAY ABDOMEN 2 VIEWS COMPARISON:  04/26/2020 FINDINGS: Lung bases are clear. No free air beneath the diaphragm. 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 Mild increased small bowel gas, decreased distension compared with radiograph from 04/26/2020. Scattered colon gas is present. No radiopaque calculi. IMPRESSION: Mild increased small bowel gas without definitive obstructive pattern. Findings could be secondary to ileus or enteritis; degree of small bowel distension is improved since comparison radiograph from 04/26/2020. Electronically Signed   By: Donavan Foil M.D.   On: 05/16/2020 17:27        Scheduled Meds: .  (feeding supplement) PROSource Plus  30 mL Oral Daily  . enoxaparin (LOVENOX) injection  30 mg Subcutaneous Q24H  . feeding supplement  1 Container Oral BID BM  . feeding supplement (KATE FARMS STANDARD 1.4)  325 mL Oral Daily  . hydrocortisone sod succinate (SOLU-CORTEF) inj  50 mg Intravenous Q8H  . multivitamin with minerals  1 tablet Oral Daily  . pantoprazole (PROTONIX) IV  40 mg Intravenous Q24H  . potassium chloride  40 mEq Oral Q4H   Continuous Infusions: . sodium chloride 125 mL/hr at 05/18/20 1006  . albumin human 25 g (05/18/20 1012)  . piperacillin-tazobactam (ZOSYN)  IV 3.375 g (05/18/20 0337)  . sodium chloride       LOS: 2 days    Time spent: 45 minutes    Irine Seal, MD [facility]   To contact the attending provider between 7A-7P or the covering provider during after hours 7P-7A, please log into the web site [email or web address] and access using universal [place] password for that web site. If you do not have the password, please call the hospital operator.  05/18/2020, 1:34 PM

## 2020-05-19 DIAGNOSIS — D509 Iron deficiency anemia, unspecified: Secondary | ICD-10-CM | POA: Diagnosis not present

## 2020-05-19 DIAGNOSIS — K219 Gastro-esophageal reflux disease without esophagitis: Secondary | ICD-10-CM | POA: Diagnosis not present

## 2020-05-19 DIAGNOSIS — E869 Volume depletion, unspecified: Secondary | ICD-10-CM | POA: Diagnosis not present

## 2020-05-19 LAB — OCCULT BLOOD X 1 CARD TO LAB, STOOL: Fecal Occult Bld: POSITIVE — AB

## 2020-05-19 LAB — PREPARE RBC (CROSSMATCH)

## 2020-05-19 LAB — RENAL FUNCTION PANEL
Albumin: 2.7 g/dL — ABNORMAL LOW (ref 3.5–5.0)
Anion gap: 5 (ref 5–15)
BUN: 7 mg/dL (ref 6–20)
CO2: 25 mmol/L (ref 22–32)
Calcium: 8.3 mg/dL — ABNORMAL LOW (ref 8.9–10.3)
Chloride: 105 mmol/L (ref 98–111)
Creatinine, Ser: 0.32 mg/dL — ABNORMAL LOW (ref 0.44–1.00)
GFR calc Af Amer: >60 mL/min (ref >60)
GFR calc non Af Amer: >60 mL/min (ref >60)
Glucose, Bld: 80 mg/dL (ref 70–99)
Phosphorus: 2.1 mg/dL — ABNORMAL LOW (ref 2.5–4.6)
Potassium: 3.1 mmol/L — ABNORMAL LOW (ref 3.5–5.1)
Sodium: 135 mmol/L (ref 135–145)

## 2020-05-19 LAB — URINE CULTURE: Culture: >=100000 — AB

## 2020-05-19 LAB — CBC
HCT: 19.4 % — ABNORMAL LOW (ref 36.0–46.0)
Hemoglobin: 6.3 g/dL — CL (ref 12.0–15.0)
MCH: 29.7 pg (ref 26.0–34.0)
MCHC: 32.5 g/dL (ref 30.0–36.0)
MCV: 91.5 fL (ref 80.0–100.0)
Platelets: 196 10*3/uL (ref 150–400)
RBC: 2.12 MIL/uL — ABNORMAL LOW (ref 3.87–5.11)
RDW: 15.9 % — ABNORMAL HIGH (ref 11.5–15.5)
WBC: 11.5 10*3/uL — ABNORMAL HIGH (ref 4.0–10.5)
nRBC: 0 % (ref 0.0–0.2)

## 2020-05-19 LAB — HEMOGLOBIN AND HEMATOCRIT, BLOOD
HCT: 30.3 % — ABNORMAL LOW (ref 36.0–46.0)
Hemoglobin: 10.1 g/dL — ABNORMAL LOW (ref 12.0–15.0)

## 2020-05-19 LAB — GLIADIN ANTIBODIES, SERUM
Antigliadin Abs, IgA: 3 units (ref 0–19)
Gliadin IgG: 2 units (ref 0–19)

## 2020-05-19 LAB — MAGNESIUM: Magnesium: 1.7 mg/dL (ref 1.7–2.4)

## 2020-05-19 LAB — PREGNANCY, URINE: Preg Test, Ur: NEGATIVE

## 2020-05-19 MED ORDER — AMOXICILLIN-POT CLAVULANATE 875-125 MG PO TABS
1.0000 | ORAL_TABLET | Freq: Two times a day (BID) | ORAL | Status: AC
  Administered 2020-05-19 – 2020-05-23 (×9): 1 via ORAL
  Filled 2020-05-19 (×9): qty 1

## 2020-05-19 MED ORDER — POTASSIUM CHLORIDE CRYS ER 20 MEQ PO TBCR
40.0000 meq | EXTENDED_RELEASE_TABLET | ORAL | Status: AC
  Administered 2020-05-19 (×2): 40 meq via ORAL
  Filled 2020-05-19 (×2): qty 2

## 2020-05-19 MED ORDER — MAGNESIUM SULFATE 4 GM/100ML IV SOLN
4.0000 g | Freq: Once | INTRAVENOUS | Status: AC
  Administered 2020-05-19: 4 g via INTRAVENOUS
  Filled 2020-05-19: qty 100

## 2020-05-19 MED ORDER — POTASSIUM PHOSPHATES 15 MMOLE/5ML IV SOLN
30.0000 mmol | Freq: Once | INTRAVENOUS | Status: AC
  Administered 2020-05-19: 30 mmol via INTRAVENOUS
  Filled 2020-05-19: qty 10

## 2020-05-19 MED ORDER — SODIUM CHLORIDE 0.9% IV SOLUTION: Freq: Once | INTRAVENOUS | Status: AC

## 2020-05-19 MED ORDER — DIPHENHYDRAMINE HCL 25 MG PO CAPS
25.0000 mg | ORAL_CAPSULE | Freq: Once | ORAL | Status: AC
  Administered 2020-05-19: 25 mg via ORAL
  Filled 2020-05-19: qty 1

## 2020-05-19 MED ORDER — ACETAMINOPHEN 325 MG PO TABS
650.0000 mg | ORAL_TABLET | Freq: Once | ORAL | Status: AC
  Administered 2020-05-19: 650 mg via ORAL
  Filled 2020-05-19: qty 2

## 2020-05-19 MED ORDER — PANTOPRAZOLE SODIUM 40 MG IV SOLR
40.0000 mg | Freq: Two times a day (BID) | INTRAVENOUS | Status: DC
  Administered 2020-05-19: 40 mg via INTRAVENOUS
  Filled 2020-05-19: qty 40

## 2020-05-19 NOTE — Progress Notes (Signed)
CRITICAL VALUE ALERT  Critical Value: Hgb- 6.3 Date & Time Notied:  05/19/2020 0627H  Provider Notified: Reece Levy MD  Orders Received/Actions taken: Made aware/notified. Awaiting orders if any

## 2020-05-19 NOTE — Progress Notes (Signed)
PROGRESS NOTE    Stacy [name]  KDT:[id number] DOB: [birth year] DOA: 05/16/2020 PCP: [name], MD    Chief Complaint  Patient presents with  . Hypotension    Brief Narrative:  HPI per Dr. Benny Lennert The patient is a 39 yr old woman. She states that she used to struggle with weight loss. However, about 7 years ago she began losing weight without trying. She had intermittent nausea and vomiting and frequently not being able to eat. She continues to lose weight. She says it is like a nightmare. Recently the patient was discharged from this institution after a stay for intestinal pseudo-obstruction. Today the patient's pain was relieved with BM.   The patient carries a past medical history significant for anxiety, chronic intestinal pseudo-obstruction, GERD, and FTT.  In the ED she was found to be hypotensive with tachycardia. Temperature, respiratory rate, and O2 saturations were normal. Creatinine is 0.45. Fe 23, TIBC 85, Ferritin is 147, albumin is 1.5, LFT's are normal. Lactic Acid is  2.0 upon presentation, but came down to 1.6 with hydration. WBC is 14.1 (increased bands and neutrophils), and Hemoglobin is 8.7. This is down from 9.2 on 05/01/2020. He is negative for COVID.   The patient denies fevers, chills, cough, shortness of breath, wheezes, rashes, headaches, or neurological changes. She admits to dizziness with standing, weakness, and feeling generally unwell.  [facility] were consulted to admit the patient for further evaluation and treatment. She will be admitted to a telemetry bed. I have ordered celiac work up as well as a cosyntropin stim test. EDP has discussed the patient with Dr. [name].  The patient remains hypotensive despite 3 L fluid. Will add hydrocortisone.  Assessment & Plan:   Principal Problem:   Volume depletion Active Problems:   Underweight   Iron deficiency anemia   Chronic intestinal pseudo-obstruction   GERD (gastroesophageal reflux  disease)   Abdominal pain with vomiting   Hypokalemia   Severe protein-calorie malnutrition (HCC)   Failure to thrive in adult   Nausea and vomiting   Sepsis (HCC)   Hypomagnesemia   Constipation   Volume depletion, gastrointestinal loss  1 hypotension/volume depletion/nausea/emesis/diarrhea/sepsis ruled out Patient presented noted to be hypotensive, volume depleted, nauseous, emesis and diarrhea.  Concern for sepsis as patient noted to be hypotensive, with a leukocytosis and tachycardia, lactic acidosis which responded to IV fluids.  Urinalysis done concerning for UTI.  Abdominal films unremarkable.  Concern for intra-abdominal infection as such patient placed empirically on IV Zosyn.  Patient also placed on IV hydrocortisone due to persistent blood pressure despite 3 L of IV fluids.  Patient noted to have more formed stools.  Patient stated had an episode of diarrhea on presentation to the ED.  GI pathogen panel negative.  C. difficile PCR canceled as patient with formed stools.  Patient currently afebrile.  Single blood culture pending.  Urine cultures with Proteus mirabilis and E. coli. Patient started on IV Solu-Cortef and as such unable to do the cosyntropin stim test.  IV Solu-Cortef have been discontinued.  Saline lock IV fluids.  Diet has been advanced to a regular diet which patient is tolerating.  Narrow IV Zosyn and transition to oral Augmentin to complete a 7-day course of treatment.  GI following and I appreciate the input and recommendations.   2.  Hypomagnesemia/hypokalemia/hypophosphatemia Likely secondary to GI losses.  Potassium at 3.1.  Magnesium at 1.7.  Magnesium sulfate 4 g IV x1.  K. Dur 40 mEq p.o. every  4 hours x2 doses.  K-Phos 30 mmol IV x1.  Repeat labs in the morning.   3.  Orthostatic hypotension/volume depletion Likely secondary to ongoing GI losses.  Patient received 3 L of IV fluid however still noted to be hypotensive.  Patient was placed on IV Solu-Cortef.   Patient pancultured with urine cultures positive for Proteus mirabilis and E. coli UTI.  Blood cultures with no growth to date.  Patient symptomatic with orthostasis..  Due to IV Solu-Cortef cosyntropin stim test will likely not be helpful and as such test has been discontinued.  Given 1 L fluid bolus.  Continue maintenance IV fluids.  IV albumin every 6 hours x1 day.  TED hose.  Will discontinue IV Solu-Cortef.  Repeat orthostatics in the morning.  If patient is still orthostatic and symptomatic will place on midodrine.  Will likely need outpatient work-up for possible adrenal insufficiency.  Follow.   4.  Gastroesophageal reflux disease Continue IV PPI.   5.  Nausea, vomiting, weight loss, abdominal pain Patient has had an extensive work-up in the outpatient setting and during last hospitalization.  Concern for celiac disease and bacterial overgrowth of small bowel versus adrenal insufficiency.  Patient noted during last hospitalization to have a tissue transglutaminase study which was negative.  Antigliadin antibodies negative.  Improved clinically.  Tolerated soft diet and diet has been advanced to regular diet per GI.  Per GI clinical presentation suggestive of cyclical vomiting syndrome.  GI discussed with patient and husband about principles of gastric physiology and cyclical vomiting in notion of a gastroparesis diet which is helpful at times of symptomatic exacerbation.  Continue antiemetics.  Supportive care.  Outpatient follow-up with GI.   6.  Proteus mirabilis/E. coli UTI Urine cultures with Proteus mirabilis and E. coli.  Transition from IV Zosyn to oral Augmentin to complete a 7-day course of treatment.   7.  Iron deficiency anemia Anemia panel with iron level of 23, TIBC of 85, folate of 6, ferritin 147, B12 514.  Hemoglobin has dropped and currently at 6.3 from 8.2.  Patient denies any significant overt bleeding however per husband and patient stool noted to be dark.  Check FOBT.  Status  post IV Feraheme x1.  Transfuse 2 units packed red blood cells.  Change IV PPI to every 12 hours.  May need upper endoscopy for further evaluation.  GI following and patient may undergo further evaluation.   8.  Chronic constipation We will need outpatient follow-up with GI her primary gastroenterologist.  9.  Severe protein calorie malnutrition Likely secondary to GI symptoms.  Patient noted to have an albumin level of 1.4.  Patient also noted to be orthostatic.  Status post IV albumin every 6 hours x1 day.  Albumin level at 2.7.  Patient currently tolerating a regular diet.  Continue nutritional supplementation.  Follow.     DVT prophylaxis: Lovenox Code Status: Full Family Communication: Updated patient.  Updated husband at bedside.  Disposition:   Status is: Inpatient    Dispo: The patient is from: Home              Anticipated d/c is to: Home              Anticipated d/c date is: 2 to 3 days              Patient currently with electrolyte abnormalities being repleted, symptomatic orthostatic hypotension, on IV antibiotics, UTI with cultures and sensitivities pending.  Anemic with hemoglobin down to 6.3  awaiting transfusion of packed red blood cells.  Not stable for discharge.        Consultants:   Gastroenterology: Dr. [name] 05/16/2020  Procedures:   Abdominal x-ray 05/16/2020  Transfusion 2 units packed red blood cells 05/19/2020  Antimicrobials:   IV Zosyn 05/16/2020>>>> 05/19/2020  Augmentin 05/19/2020   Subjective: Patient sitting up in bed.  Denies any chest pain.  No shortness of breath.  No nausea or emesis.  Tolerating diet.  Stated got up for orthostatics this morning and did not have any significant dizziness.  Denies any bloody stools.  Per husband and patient patient noted to have some dark stools.   Objective: Vitals:   05/18/20 1636 05/18/20 2053 05/18/20 2346 05/19/20 0435  BP: 110/89 105/76 (!) 124/99 116/78  Pulse: 88 94 85 79  Resp: 18 16 18 18    Temp: 98.4 F (36.9 C) 98.1 F (36.7 C) 98.1 F (36.7 C) 98.2 F (36.8 C)  TempSrc:  Oral    SpO2: 97% 97% 97% 93%  Weight:      Height:        Intake/Output Summary (Last 24 hours) at 05/19/2020 1219 Last data filed at 05/19/2020 0600 Gross per 24 hour  Intake 3873.32 ml  Output --  Net 3873.32 ml   Filed Weights   05/16/20 0841  Weight: 42.6 kg    Examination:  General exam: Frail.  Thin female.  No acute distress.  Respiratory system: Lungs clear to auscultation bilaterally.  No wheezes, no crackles, no rhonchi.  Normal respiratory effort. Cardiovascular system: RRR no murmurs rubs or gallops.  No JVD.  No lower extremity edema.  Gastrointestinal system: Abdomen is soft, nontender, nondistended, positive bowel sounds.  No rebound.  No guarding.   Central nervous system: Alert and oriented. No focal neurological deficits. Extremities: Symmetric 5 x 5 power. Skin: No rashes, lesions or ulcers Psychiatry: Judgement and insight appear normal. Mood & affect appropriate.     Data Reviewed: I have personally reviewed following labs and imaging studies  CBC: Recent Labs  Lab 05/16/20 0856 05/16/20 0856 05/16/20 1218 05/17/20 0810 05/17/20 1418 05/18/20 0556 05/19/20 0553  WBC 14.1*   < > 12.4* 9.1 8.7 9.5 11.5*  NEUTROABS 11.9*  --   --   --  6.4 6.9  --   HGB 8.7*   < > 7.5* 7.8* 8.1* 8.2* 6.3*  HCT 26.9*   < > 23.4* 24.3* 25.1* 25.0* 19.4*  MCV 91.8   < > 91.1 92.4 91.6 90.9 91.5  PLT 334   < > 300 368 362 326 196   < > = values in this interval not displayed.    Basic Metabolic Panel: Recent Labs  Lab 05/16/20 0856 05/16/20 1218 05/17/20 0810 05/18/20 0556 05/19/20 0553  NA 136  --  133* 134* 135  K 3.6  --  3.6 3.2* 3.1*  CL 104  --  105 104 105  CO2 21*  --  23 21* 25  GLUCOSE 94  --  149* 209* 80  BUN 37*  --  18 12 7   CREATININE 0.45 0.35* 0.42* 0.33* 0.32*  CALCIUM 7.3*  --  7.2* 7.6* 8.3*  MG  --   --  1.3* 2.0 1.7  PHOS  --   --   --  3.2  2.1*    GFR: Estimated Creatinine Clearance: 62.9 mL/min (A) (by C-G formula based on SCr of 0.32 mg/dL (L)).  Liver Function Tests: Recent Labs  Lab 05/16/20 [phone]  05/17/20 0810 05/18/20 0556 05/19/20 0553  AST 16 11* 10*  --   ALT 11 11 9   --   ALKPHOS 50 55 57  --   BILITOT 0.5 0.4 0.7  --   PROT 4.0* 3.6* 3.6*  --   ALBUMIN 1.5* 1.4* 1.4* 2.7*    CBG: No results for input(s): GLUCAP in the last 168 hours.   Recent Results (from the past 240 hour(s))  Respiratory Panel by RT PCR (Flu A&B, Covid) - Nasopharyngeal Swab     Status: None   Collection Time: 05/16/20 10:54 AM   Specimen: Nasopharyngeal Swab  Result Value Ref Range Status   SARS Coronavirus 2 by RT PCR NEGATIVE NEGATIVE Final    Comment: (NOTE) SARS-CoV-2 target nucleic acids are NOT DETECTED.  The SARS-CoV-2 RNA is generally detectable in upper respiratoy specimens during the acute phase of infection. The lowest concentration of SARS-CoV-2 viral copies this assay can detect is 131 copies/mL. A negative result does not preclude SARS-Cov-2 infection and should not be used as the sole basis for treatment or other patient management decisions. A negative result may occur with  improper specimen collection/handling, submission of specimen other than nasopharyngeal swab, presence of viral mutation(s) within the areas targeted by this assay, and inadequate number of viral copies (<131 copies/mL). A negative result must be combined with clinical observations, patient history, and epidemiological information. The expected result is Negative.  Fact Sheet for Patients:  PinkCheek.be  Fact Sheet for Healthcare Providers:  GravelBags.it  This test is no t yet approved or cleared by the [place] FDA and  has been authorized for detection and/or diagnosis of SARS-CoV-2 by FDA under an Emergency Use Authorization (EUA). This EUA will remain  in effect  (meaning this test can be used) for the duration of the COVID-19 declaration under Section 564(b)(1) of the Act, 21 U.S.C. section 360bbb-3(b)(1), unless the authorization is terminated or revoked sooner.     Influenza A by PCR NEGATIVE NEGATIVE Final   Influenza B by PCR NEGATIVE NEGATIVE Final    Comment: (NOTE) The Xpert Xpress SARS-CoV-2/FLU/RSV assay is intended as an aid in  the diagnosis of influenza from Nasopharyngeal swab specimens and  should not be used as a sole basis for treatment. Nasal washings and  aspirates are unacceptable for Xpert Xpress SARS-CoV-2/FLU/RSV  testing.  Fact Sheet for Patients: PinkCheek.be  Fact Sheet for Healthcare Providers: GravelBags.it  This test is not yet approved or cleared by the [place] FDA and  has been authorized for detection and/or diagnosis of SARS-CoV-2 by  FDA under an Emergency Use Authorization (EUA). This EUA will remain  in effect (meaning this test can be used) for the duration of the  Covid-19 declaration under Section 564(b)(1) of the Act, 21  U.S.C. section 360bbb-3(b)(1), unless the authorization is  terminated or revoked. Performed at [facility], [place] [address] [place], [place] [address]   Culture, blood (single)     Status: None (Preliminary result)   Collection Time: 05/16/20 11:04 AM   Specimen: BLOOD  Result Value Ref Range Status   Specimen Description   Final    BLOOD LEFT ANTECUBITAL Performed at [facility] [address], [place], [place] [address]    Special Requests   Final    BOTTLES DRAWN AEROBIC AND ANAEROBIC Blood Culture adequate volume Performed at [facility] [address], [place], [place] [address]    Culture   Final  NO GROWTH 3 DAYS Performed at [facility], [place] [address], [place], [place] [address]    Report Status PENDING  Incomplete  Urine culture      Status: Abnormal   Collection Time: 05/16/20  5:14 PM   Specimen: Urine, Random  Result Value Ref Range Status   Specimen Description   Final    URINE, RANDOM Performed at [facility] [address], [place], [place] [address]    Special Requests   Final    NONE Performed at [facility], [place] [address], [place], [place] [address]    Culture (A)  Final    >=100,000 COLONIES/mL PROTEUS MIRABILIS >=100,000 COLONIES/mL ESCHERICHIA COLI    Report Status 05/19/2020 FINAL  Final   Organism ID, Bacteria PROTEUS MIRABILIS (A)  Final   Organism ID, Bacteria ESCHERICHIA COLI (A)  Final      Susceptibility   Escherichia coli - MIC*    AMPICILLIN <=2 SENSITIVE Sensitive     CEFAZOLIN <=4 SENSITIVE Sensitive     CEFTRIAXONE <=0.25 SENSITIVE Sensitive     CIPROFLOXACIN >=4 RESISTANT Resistant     GENTAMICIN <=1 SENSITIVE Sensitive     IMIPENEM <=0.25 SENSITIVE Sensitive     NITROFURANTOIN <=16 SENSITIVE Sensitive     TRIMETH/SULFA <=20 SENSITIVE Sensitive     AMPICILLIN/SULBACTAM <=2 SENSITIVE Sensitive     PIP/TAZO <=4 SENSITIVE Sensitive     * >=100,000 COLONIES/mL ESCHERICHIA COLI   Proteus mirabilis - MIC*    AMPICILLIN <=2 SENSITIVE Sensitive     CEFAZOLIN <=4 SENSITIVE Sensitive     CEFTRIAXONE <=0.25 SENSITIVE Sensitive     CIPROFLOXACIN <=0.25 SENSITIVE Sensitive     GENTAMICIN <=1 SENSITIVE Sensitive     IMIPENEM 0.5 SENSITIVE Sensitive     NITROFURANTOIN RESISTANT Resistant     TRIMETH/SULFA <=20 SENSITIVE Sensitive     AMPICILLIN/SULBACTAM <=2 SENSITIVE Sensitive     PIP/TAZO <=4 SENSITIVE Sensitive     * >=100,000 COLONIES/mL PROTEUS MIRABILIS  Gastrointestinal Panel by PCR , Stool     Status: None   Collection Time: 05/16/20  5:14 PM   Specimen: Stool  Result Value Ref Range Status   Campylobacter species NOT DETECTED NOT DETECTED Final   Plesimonas shigelloides NOT DETECTED NOT DETECTED Final   Salmonella species NOT  DETECTED NOT DETECTED Final   Yersinia enterocolitica NOT DETECTED NOT DETECTED Final   Vibrio species NOT DETECTED NOT DETECTED Final   Vibrio cholerae NOT DETECTED NOT DETECTED Final   Enteroaggregative E coli (EAEC) NOT DETECTED NOT DETECTED Final   Enteropathogenic E coli (EPEC) NOT DETECTED NOT DETECTED Final   Enterotoxigenic E coli (ETEC) NOT DETECTED NOT DETECTED Final   Shiga like toxin producing E coli (STEC) NOT DETECTED NOT DETECTED Final   Shigella/Enteroinvasive E coli (EIEC) NOT DETECTED NOT DETECTED Final   Cryptosporidium NOT DETECTED NOT DETECTED Final   Cyclospora cayetanensis NOT DETECTED NOT DETECTED Final   Entamoeba histolytica NOT DETECTED NOT DETECTED Final   Giardia lamblia NOT DETECTED NOT DETECTED Final   Adenovirus F40/41 NOT DETECTED NOT DETECTED Final   Astrovirus NOT DETECTED NOT DETECTED Final   Norovirus GI/GII NOT DETECTED NOT DETECTED Final   Rotavirus A NOT DETECTED NOT DETECTED Final   Sapovirus (I, II, IV, and V) NOT DETECTED NOT DETECTED Final    Comment: Performed at [facility], [address] [place],  [address]         Radiology Studies: No results found.  Scheduled Meds: . (feeding supplement) PROSource Plus  30 mL Oral Daily  . sodium chloride   Intravenous Once  . acetaminophen  650 mg Oral Once  . diphenhydrAMINE  25 mg Oral Once  . feeding supplement  1 Container Oral BID BM  . feeding supplement (KATE FARMS STANDARD 1.4)  325 mL Oral Daily  . multivitamin with minerals  1 tablet Oral Daily  . pantoprazole (PROTONIX) IV  40 mg Intravenous Q12H  . potassium chloride  40 mEq Oral Q4H   Continuous Infusions: . piperacillin-tazobactam (ZOSYN)  IV 3.375 g (05/19/20 0255)  . potassium PHOSPHATE IVPB (in mmol) 30 mmol (05/19/20 0849)     LOS: 3 days    Time spent: 40 minutes    Irine Seal, MD [facility]   To contact the attending provider between 7A-7P or the covering provider  during after hours 7P-7A, please log into the web site [email or web address] and access using universal [place] password for that web site. If you do not have the password, please call the hospital operator.  05/19/2020, 12:19 PM

## 2020-05-19 NOTE — Progress Notes (Signed)
PT Cancellation Note  Patient Details Name: Stacy [name] MRN: [id number] DOB: 1981-04-10   Cancelled Treatment:    Reason Eval/Treat Not Completed: Patient not medically ready (Hgb 6.3)   [facility] 05/19/2020, 9:49 AM

## 2020-05-19 NOTE — Progress Notes (Signed)
The patient states that she has been feeling better today, but she had a drop in her hemoglobin and she also tells me that her stools have been darker today, and they were found to be Hemoccult positive.  She denies any vomiting.  Because of all this we talked about doing a EGD and I will plan on doing this tomorrow.

## 2020-05-19 NOTE — Progress Notes (Signed)
OT Cancellation Note  Patient Details Name: Stacy [name] MRN: [id number] DOB: July 20, 1981   Cancelled Treatment:    Reason Eval/Treat Not Completed: Other (comment)   Patient not medically ready (Hgb 6.3)  Webb, Mickel Baas, [place] [phone] [phone]    05/19/2020, 3:26 PM

## 2020-05-20 ENCOUNTER — Inpatient Hospital Stay (HOSPITAL_COMMUNITY): Payer: 59 | Admitting: Anesthesiology

## 2020-05-20 ENCOUNTER — Encounter (HOSPITAL_COMMUNITY): Payer: Self-pay | Admitting: Internal Medicine

## 2020-05-20 ENCOUNTER — Encounter (HOSPITAL_COMMUNITY)
Admission: EM | Disposition: A | Discharge status: Home Health | Payer: Self-pay | Source: Home / Self Care | Attending: Internal Medicine

## 2020-05-20 DIAGNOSIS — K219 Gastro-esophageal reflux disease without esophagitis: Secondary | ICD-10-CM | POA: Diagnosis not present

## 2020-05-20 DIAGNOSIS — D509 Iron deficiency anemia, unspecified: Secondary | ICD-10-CM | POA: Diagnosis not present

## 2020-05-20 DIAGNOSIS — E869 Volume depletion, unspecified: Secondary | ICD-10-CM | POA: Diagnosis not present

## 2020-05-20 HISTORY — PX: ESOPHAGOGASTRODUODENOSCOPY (EGD) WITH PROPOFOL: SHX5813

## 2020-05-20 HISTORY — PX: BIOPSY: SHX5522

## 2020-05-20 LAB — BPAM RBC
Blood Product Expiration Date: 202110282359
Blood Product Expiration Date: 202110302359
ISSUE DATE / TIME: 202110041305
ISSUE DATE / TIME: 202110041554
Unit Type and Rh: 5100
Unit Type and Rh: 5100

## 2020-05-20 LAB — TYPE AND SCREEN
ABO/RH(D): O POS
Antibody Screen: NEGATIVE
Unit division: 0
Unit division: 0

## 2020-05-20 LAB — CBC
HCT: 32.3 % — ABNORMAL LOW (ref 36.0–46.0)
Hemoglobin: 10.7 g/dL — ABNORMAL LOW (ref 12.0–15.0)
MCH: 29.4 pg (ref 26.0–34.0)
MCHC: 33.1 g/dL (ref 30.0–36.0)
MCV: 88.7 fL (ref 80.0–100.0)
Platelets: 255 10*3/uL (ref 150–400)
RBC: 3.64 MIL/uL — ABNORMAL LOW (ref 3.87–5.11)
RDW: 16.9 % — ABNORMAL HIGH (ref 11.5–15.5)
WBC: 10.7 10*3/uL — ABNORMAL HIGH (ref 4.0–10.5)
nRBC: 0.2 % (ref 0.0–0.2)

## 2020-05-20 LAB — RENAL FUNCTION PANEL
Albumin: 2.7 g/dL — ABNORMAL LOW (ref 3.5–5.0)
Anion gap: 6 (ref 5–15)
BUN: 7 mg/dL (ref 6–20)
CO2: 29 mmol/L (ref 22–32)
Calcium: 8.2 mg/dL — ABNORMAL LOW (ref 8.9–10.3)
Chloride: 101 mmol/L (ref 98–111)
Creatinine, Ser: 0.34 mg/dL — ABNORMAL LOW (ref 0.44–1.00)
GFR calc Af Amer: >60 mL/min (ref >60)
GFR calc non Af Amer: >60 mL/min (ref >60)
Glucose, Bld: 87 mg/dL (ref 70–99)
Phosphorus: 3.5 mg/dL (ref 2.5–4.6)
Potassium: 4.7 mmol/L (ref 3.5–5.1)
Sodium: 136 mmol/L (ref 135–145)

## 2020-05-20 LAB — MAGNESIUM: Magnesium: 2 mg/dL (ref 1.7–2.4)

## 2020-05-20 SURGERY — ESOPHAGOGASTRODUODENOSCOPY (EGD) WITH PROPOFOL
Anesthesia: Monitor Anesthesia Care

## 2020-05-20 MED ORDER — SODIUM CHLORIDE 0.9 % IV SOLN: INTRAVENOUS | Status: DC

## 2020-05-20 MED ORDER — ONDANSETRON HCL 4 MG/2ML IJ SOLN
INTRAMUSCULAR | Status: DC | PRN
  Administered 2020-05-20: 4 mg via INTRAVENOUS

## 2020-05-20 MED ORDER — PROPOFOL 500 MG/50ML IV EMUL
INTRAVENOUS | Status: DC | PRN
  Administered 2020-05-20: 350 ug/kg/min via INTRAVENOUS

## 2020-05-20 MED ORDER — LACTATED RINGERS IV SOLN
INTRAVENOUS | Status: DC
  Administered 2020-05-20: 1000 mL via INTRAVENOUS

## 2020-05-20 MED ORDER — PROPOFOL 1000 MG/100ML IV EMUL
INTRAVENOUS | Status: AC
  Filled 2020-05-20: qty 100

## 2020-05-20 MED ORDER — LIDOCAINE HCL (CARDIAC) PF 100 MG/5ML IV SOSY
PREFILLED_SYRINGE | INTRAVENOUS | Status: DC | PRN
  Administered 2020-05-20: 40 mg via INTRAVENOUS

## 2020-05-20 MED ORDER — MIDODRINE HCL 5 MG PO TABS
5.0000 mg | ORAL_TABLET | Freq: Three times a day (TID) | ORAL | Status: DC
  Administered 2020-05-20 – 2020-05-21 (×4): 5 mg via ORAL
  Filled 2020-05-20 (×4): qty 1

## 2020-05-20 MED ORDER — PANTOPRAZOLE SODIUM 40 MG PO TBEC
40.0000 mg | DELAYED_RELEASE_TABLET | Freq: Two times a day (BID) | ORAL | Status: DC
  Administered 2020-05-20 – 2020-05-28 (×17): 40 mg via ORAL
  Filled 2020-05-20 (×17): qty 1

## 2020-05-20 MED ORDER — DEXAMETHASONE SODIUM PHOSPHATE 10 MG/ML IJ SOLN
INTRAMUSCULAR | Status: DC | PRN
  Administered 2020-05-20: 4 mg via INTRAVENOUS

## 2020-05-20 SURGICAL SUPPLY — 15 items

## 2020-05-20 NOTE — Anesthesia Postprocedure Evaluation (Signed)
Anesthesia Post Note  Patient: GENIECE AKERS  Procedure(s) Performed: ESOPHAGOGASTRODUODENOSCOPY (EGD) WITH PROPOFOL (N/A ) BIOPSY     Patient location during evaluation: PACU Anesthesia Type: MAC Level of consciousness: awake and alert Pain management: pain level controlled Vital Signs Assessment: post-procedure vital signs reviewed and stable Respiratory status: spontaneous breathing, nonlabored ventilation and respiratory function stable Cardiovascular status: blood pressure returned to baseline and stable Postop Assessment: no apparent nausea or vomiting Anesthetic complications: no   No complications documented.  Last Vitals:  Vitals:   05/20/20 1055 05/20/20 1117  BP: (!) 120/94 (!) 125/97  Pulse: 84 85  Resp: 18 18  Temp: 36.4 C 36.7 C  SpO2: 95% 98%    Last Pain:  Vitals:   05/20/20 1117  TempSrc: Oral  PainSc:                  Pervis Hocking

## 2020-05-20 NOTE — Progress Notes (Signed)
PT Cancellation Note  Patient Details Name: Stacy [name] MRN: [id number] DOB: 1981-06-26   Cancelled Treatment:    Reason Eval/Treat Not Completed: Patient at procedure or test/unavailable   [facility] 05/20/2020, 9:17 AM

## 2020-05-20 NOTE — Anesthesia Preprocedure Evaluation (Addendum)
Anesthesia Evaluation  Patient identified by MRN, date of birth, ID band Patient awake    Reviewed: Allergy & Precautions, NPO status , Patient's Chart, lab work & pertinent test results  Airway Mallampati: I  TM Distance: >3 FB Neck ROM: Full    Dental  (+) Teeth Intact, Dental Advisory Given   Pulmonary neg pulmonary ROS,    Pulmonary exam normal breath sounds clear to auscultation       Cardiovascular negative cardio ROS Normal cardiovascular exam Rhythm:Regular Rate:Normal     Neuro/Psych PSYCHIATRIC DISORDERS Anxiety negative neurological ROS     GI/Hepatic Neg liver ROS, GERD  Medicated and Controlled,GIB Chronic intestinal pseudo-obstruction, malnutrition    Endo/Other  negative endocrine ROS  Renal/GU negative Renal ROS  negative genitourinary   Musculoskeletal negative musculoskeletal ROS (+)   Abdominal   Peds negative pediatric ROS (+)  Hematology  (+) Blood dyscrasia, anemia , GIB, H/H 10.7/32.3 this AM   Anesthesia Other Findings   Reproductive/Obstetrics negative OB ROS                            Anesthesia Physical Anesthesia Plan  ASA: II  Anesthesia Plan: MAC   Post-op Pain Management:    Induction:   PONV Risk Score and Plan: 2 and Propofol infusion and TIVA  Airway Management Planned: Natural Airway and Simple Face Mask  Additional Equipment: None  Intra-op Plan:   Post-operative Plan:   Informed Consent: I have reviewed the patients History and Physical, chart, labs and discussed the procedure including the risks, benefits and alternatives for the proposed anesthesia with the patient or authorized representative who has indicated his/her understanding and acceptance.       Plan Discussed with: CRNA  Anesthesia Plan Comments:        Anesthesia Quick Evaluation

## 2020-05-20 NOTE — Progress Notes (Signed)
   05/20/20 1446  Assess: MEWS Score  Temp 97.9 F (36.6 C)  BP (!) 88/68  Pulse Rate (!) 130  Resp 18  SpO2 97 %  O2 Device Room Air  Assess: MEWS Score  MEWS Temp 0  MEWS Systolic 1  MEWS Pulse 3  MEWS RR 0  MEWS LOC 0  MEWS Score 4  MEWS Score Color Red  Assess: if the MEWS score is Yellow or Red  Were vital signs taken at a resting state? Yes  Focused Assessment No change from prior assessment  Early Detection of Sepsis Score *See Row Information* Low  MEWS guidelines implemented *See Row Information* Yes  Treat  Pain Scale 0-10  Pain Score 0  Take Vital Signs  Increase Vital Sign Frequency  Red: Q 1hr X 4 then Q 4hr X 4, if remains red, continue Q 4hrs  Escalate  MEWS: Escalate Red: discuss with charge nurse/RN and provider, consider discussing with RRT  Notify: Charge Nurse/RN  Name of Charge Nurse/RN Notified Sharyn Blitz  Date Charge Nurse/RN Notified 05/20/20  Time Charge Nurse/RN Notified 2334  Notify: Provider  Provider Name/Title Dr Grandville Silos  Date Provider Notified 05/20/20  Time Provider Notified 1455  Notification Type Face-to-face  Notification Reason Change in status  Response See new orders  Date of Provider Response 05/20/20  Time of Provider Response 1455

## 2020-05-20 NOTE — Anesthesia Procedure Notes (Signed)
Procedure Name: MAC Date/Time: 05/20/2020 9:43 AM Performed by: Lissa Morales, CRNA Pre-anesthesia Checklist: Patient identified, Emergency Drugs available, Suction available, Patient being monitored and Timeout performed Patient Re-evaluated:Patient Re-evaluated prior to induction Oxygen Delivery Method: Simple face mask Preoxygenation: Pre-oxygenation with 100% oxygen (POM mask) Placement Confirmation: positive ETCO2

## 2020-05-20 NOTE — Op Note (Signed)
[facility] ([facility] Patient Name: Stacy [name] Procedure Date: 05/20/2020 MRN: [id number] Attending MD: Wonda Horner , MD Date of Birth: 11-30-1980 CSN: [id number] Age: 39 Admit Type: Inpatient Procedure:                Upper GI endoscopy Indications:              Acute post hemorrhagic anemia, Heme positive stool Providers:                Wonda Horner, MD, Josie Dixon, RN, Particia Nearing, RN, Tyrone Apple, Technician, Enrigue Catena, CRNA Referring MD:              Medicines:                Propofol per Anesthesia Complications:            No immediate complications. Estimated Blood Loss:     Estimated blood loss was minimal. Procedure:                Pre-Anesthesia Assessment:                           - Prior to the procedure, a History and Physical                            was performed, and patient medications and                            allergies were reviewed. The patient's tolerance of                            previous anesthesia was also reviewed. The risks                            and benefits of the procedure and the sedation                            options and risks were discussed with the patient.                            All questions were answered, and informed consent                            was obtained. Prior Anticoagulants: The patient has                            taken no previous anticoagulant or antiplatelet                            agents. ASA Grade Assessment: II - A patient with  mild systemic disease. After reviewing the risks                            and benefits, the patient was deemed in                            satisfactory condition to undergo the procedure.                           After obtaining informed consent, the endoscope was                            passed under direct vision. Throughout the                             procedure, the patient's blood pressure, pulse, and                            oxygen saturations were monitored continuously. The                            GIF-H190 ([id number]) Olympus gastroscope was                            introduced through the mouth, and advanced to the                            second part of duodenum. The upper GI endoscopy was                            accomplished without difficulty. The patient                            tolerated the procedure well. Scope In: Scope Out: Findings:      The examined esophagus was normal.      Diffuse moderately erythematous mucosa without bleeding was found in the       entire examined stomach. Biopsies were taken with a cold forceps for       histology.      The examined duodenum was normal. Impression:               - Normal esophagus.                           - Erythematous mucosa in the stomach. Biopsied.                           - Normal examined duodenum. Moderate Sedation:      . Recommendation:           - Advance diet as tolerated.                           - Continue present medications. Follow H and H                           -  Await pathology results. Procedure Code(s):        --- Professional ---                           [phone], Esophagogastroduodenoscopy, flexible,                            transoral; with biopsy, single or multiple Diagnosis Code(s):        --- Professional ---                           K31.89, Other diseases of stomach and duodenum                           D62, Acute posthemorrhagic anemia                           R19.5, Other fecal abnormalities CPT copyright 2019 American Medical Association. All rights reserved. The codes documented in this report are preliminary and upon coder review may  be revised to meet current compliance requirements. Wonda Horner, MD 05/20/2020 10:21:59 AM This report has been signed electronically. Number of Addenda: 0

## 2020-05-20 NOTE — Transfer of Care (Signed)
Immediate Anesthesia Transfer of Care Note  Patient: Stacy [name]  Procedure(s) Performed: ESOPHAGOGASTRODUODENOSCOPY (EGD) WITH PROPOFOL (N/A ) BIOPSY  Patient Location: PACU  Anesthesia Type:MAC  Level of Consciousness: sedated and patient cooperative  Airway & Oxygen Therapy: Patient Spontanous Breathing and Patient connected to face mask oxygen  Post-op Assessment: Report given to RN and Post -op Vital signs reviewed and stable  Post vital signs: stable  Last Vitals:  Vitals Value Taken Time  BP 77/43 05/20/20 1010  Temp    Pulse 79 05/20/20 1012  Resp 14 05/20/20 1012  SpO2 100 % 05/20/20 1012  Vitals shown include unvalidated device data.  Last Pain:  Vitals:   05/20/20 0849  TempSrc: Oral  PainSc:       Patients Stated Pain Goal: 0 (57/01/77 9390)  Complications: No complications documented.

## 2020-05-20 NOTE — Progress Notes (Addendum)
PROGRESS NOTE    Stacy [name]  QQP:[id number] DOB: November 06, 1980 DOA: 05/16/2020 PCP: [name], MD    Chief Complaint  Patient presents with  . Hypotension    Brief Narrative:  HPI per Dr. Benny Lennert The patient is a 39 yr old woman. She states that she used to struggle with weight loss. However, about 7 years ago she began losing weight without trying. She had intermittent nausea and vomiting and frequently not being able to eat. She continues to lose weight. She says it is like a nightmare. Recently the patient was discharged from this institution after a stay for intestinal pseudo-obstruction. Today the patient's pain was relieved with BM.   The patient carries a past medical history significant for anxiety, chronic intestinal pseudo-obstruction, GERD, and FTT.  In the ED she was found to be hypotensive with tachycardia. Temperature, respiratory rate, and O2 saturations were normal. Creatinine is 0.45. Fe 23, TIBC 85, Ferritin is 147, albumin is 1.5, LFT's are normal. Lactic Acid is  2.0 upon presentation, but came down to 1.6 with hydration. WBC is 14.1 (increased bands and neutrophils), and Hemoglobin is 8.7. This is down from 9.2 on 05/01/2020. He is negative for COVID.   The patient denies fevers, chills, cough, shortness of breath, wheezes, rashes, headaches, or neurological changes. She admits to dizziness with standing, weakness, and feeling generally unwell.  [facility] were consulted to admit the patient for further evaluation and treatment. She will be admitted to a telemetry bed. I have ordered celiac work up as well as a cosyntropin stim test. EDP has discussed the patient with Dr. [name].  The patient remains hypotensive despite 3 L fluid. Will add hydrocortisone.  Assessment & Plan:   Principal Problem:   Volume depletion Active Problems:   Underweight   Iron deficiency anemia   Chronic intestinal pseudo-obstruction   GERD (gastroesophageal reflux  disease)   Abdominal pain with vomiting   Hypokalemia   Severe protein-calorie malnutrition (HCC)   Failure to thrive in adult   Nausea and vomiting   Sepsis (HCC)   Hypomagnesemia   Constipation   Volume depletion, gastrointestinal loss  1 hypotension/volume depletion/nausea/emesis/diarrhea/sepsis ruled out Patient presented noted to be hypotensive, volume depleted, nauseous, emesis and diarrhea.  Concern for sepsis as patient noted to be hypotensive, with a leukocytosis and tachycardia, lactic acidosis which responded to IV fluids.  Urinalysis done concerning for UTI.  Abdominal films unremarkable.  Concern for intra-abdominal infection as such patient placed empirically on IV Zosyn.  Patient also placed on IV hydrocortisone due to persistent blood pressure despite 3 L of IV fluids.  Patient noted to have more formed stools.  Patient stated had an episode of diarrhea on presentation to the ED.  GI pathogen panel negative.  C. difficile PCR canceled as patient with formed stools.  Patient currently afebrile.  Single blood culture pending.  Urine cultures with Proteus mirabilis and E. coli. Patient started on IV Solu-Cortef and as such unable to do the cosyntropin stim test.  IV Solu-Cortef have been discontinued.  Saline lock IV fluids.  Diet has been advanced to a regular diet which patient is tolerating.  IV Zosyn transition to oral Augmentin to complete a 7-day course of treatment.  GI following and appreciate input and recommendations.  2.  Hypomagnesemia/hypokalemia/hypophosphatemia Likely secondary to GI losses.  Potassium at 4.7.  Magnesium at 2.0.  Repeat labs in the morning.  Follow.   3.  Orthostatic hypotension/volume depletion Likely secondary to ongoing  GI losses.  Patient received 3 L of IV fluid however still noted to be hypotensive.  Patient was placed on IV Solu-Cortef.  Patient pancultured with urine cultures positive for Proteus mirabilis and E. coli UTI.  Blood cultures with no  growth to date.  Patient symptomatic with orthostasis..  Due to IV Solu-Cortef, cosyntropin stim test will likely not be helpful and as such test has been discontinued.  Patient hydrated with IV fluids and currently euvolemic.  TED hose ordered.  Patient status post IV albumin every 6 hours x1 day.  Solu-Cortef was discontinued.  Repeat orthostatics done this morning however patient remained asymptomatic.  Patient was to be discharged however was called by nursing this afternoon that patient with systolics in the 40J with a heart rate in the 130s with some dizziness on ambulation to the bathroom.  Discharge canceled.  EKG done consistent with a sinus tachycardia.  Will place patient on midodrine 5 mg 3 times daily.  Observe over the next 24 hours.  Repeat orthostatics in the morning.  If asymptomatic with improvement in orthostatics could likely be discharged home.  Will need outpatient follow-up with PCP for further evaluation and may need a cosyntropin stim test done in the outpatient setting.   4.  Gastroesophageal reflux disease Status post upper endoscopy which showed a normal esophagus, normal duodenum, erythematous mucosa in the stomach.  Change IV PPI to oral PPI.  Follow.  5.  Nausea, vomiting, weight loss, abdominal pain Patient has had an extensive work-up in the outpatient setting and during last hospitalization.  Concern for celiac disease and bacterial overgrowth of small bowel versus adrenal insufficiency.  Patient noted during last hospitalization to have a tissue transglutaminase study which was negative.  Antigliadin antibodies negative.  Improved clinically.  Tolerated soft diet and diet has been advanced to regular diet per GI.  Per GI clinical presentation suggestive of cyclical vomiting syndrome.  GI discussed with patient and husband about principles of gastric physiology and cyclical vomiting in notion of a gastroparesis diet which is helpful at times of symptomatic exacerbation.   Continue antiemetics.  Supportive care.  Outpatient follow-up with GI.   6.  Proteus mirabilis/E. coli UTI Urine cultures with Proteus mirabilis and E. coli.  Patient was on IV Zosyn and has been transitioned to oral Augmentin to complete a 7-day course of treatment.   7.  Iron deficiency anemia Anemia panel with iron level of 23, TIBC of 85, folate of 6, ferritin 147, B12 514.  Hemoglobin dropped down to 6.3 from 8.2.  Status post 2 units packed red blood cells.  Hemoglobin currently at 10.7 this morning.  FOBT was positive.  Patient status post IV Feraheme.  Patient seen by GI and underwent upper endoscopy this morning which showed normal esophagus, normal duodenum, erythematous mucosa in the stomach.  Patient on IV PPI every 12 hours which we will transition to oral PPI twice daily.  GI following and appreciate input and recommendations.  8.  Chronic constipation We will need outpatient follow-up with GI her primary gastroenterologist.  9.  Severe protein calorie malnutrition Likely secondary to GI symptoms.  Patient noted to have an albumin level of 1.4.  Patient also noted to be orthostatic.  Status post IV albumin every 6 hours x1 day.  Albumin level at 2.7.  Patient currently tolerating a regular diet.  Continue nutritional supplementation.  Follow.     DVT prophylaxis: Lovenox Code Status: Full Family Communication: Updated patient.  Updated husband at  bedside.  Disposition:   Status is: Inpatient    Dispo: The patient is from: Home              Anticipated d/c is to: Home              Anticipated d/c date is: 1 to 2 days              Patient currently wants to be discharged however per RN patient noted to have systolic blood pressures in the 80s with heart rate in the 130s when she went to the bathroom with complaints of dizziness and as such discharge canceled.        Consultants:   Gastroenterology: Dr. [name] 05/16/2020  Procedures:   Abdominal x-ray  05/16/2020  Transfusion 2 units packed red blood cells 05/19/2020  Upper endoscopy 05/20/2020 per Dr. Penelope Coop  Antimicrobials:   IV Zosyn 05/16/2020>>>> 05/19/2020  Augmentin 05/19/2020   Subjective: Patient sitting up in bed.  Just returned from upper endoscopy.  Denies any dizziness.  Denies any chest pain.  Tolerating current diet.  Denies any significant bloody stool.  Feeling much better than she did on admission.  Stated she was asymptomatic with orthostatics that were checked this morning.   Was called by RN as patient was to be discharged that patient noted to have systolics in the 96E with heart rates in the 130s with some associated dizziness when she was going to the bathroom.   Objective: Vitals:   05/20/20 1030 05/20/20 1035 05/20/20 1055 05/20/20 1117  BP: 120/85 118/89 (!) 120/94 (!) 125/97  Pulse: 86 85 84 85  Resp: (!) 21 15 18 18   Temp:   97.6 F (36.4 C) 98 F (36.7 C)  TempSrc:    Oral  SpO2: 96% 96% 95% 98%  Weight:      Height:        Intake/Output Summary (Last 24 hours) at 05/20/2020 1314 Last data filed at 05/20/2020 0525 Gross per 24 hour  Intake 1539.23 ml  Output 350 ml  Net 1189.23 ml   Filed Weights   05/16/20 0841 05/20/20 0849  Weight: 42.6 kg 42.6 kg    Examination:  General exam: Frail.  Thin female.  No acute distress.  Respiratory system: CTAB.  No wheezes, no crackles, no rhonchi.  Normal respiratory effort. Cardiovascular system: Regular rate rhythm no murmurs rubs or gallops.  No JVD.  No lower extremity edema.  Gastrointestinal system: Abdomen is soft, nontender, nondistended, positive bowel sounds.  No rebound.  No guarding.  Central nervous system: Alert and oriented. No focal neurological deficits. Extremities: Symmetric 5 x 5 power. Skin: No rashes, lesions or ulcers Psychiatry: Judgement and insight appear normal. Mood & affect appropriate.     Data Reviewed: I have personally reviewed following labs and imaging  studies  CBC: Recent Labs  Lab 05/16/20 0856 05/16/20 1218 05/17/20 0810 05/17/20 0810 05/17/20 1418 05/18/20 0556 05/19/20 0553 05/19/20 2053 05/20/20 0502  WBC 14.1*   < > 9.1  --  8.7 9.5 11.5*  --  10.7*  NEUTROABS 11.9*  --   --   --  6.4 6.9  --   --   --   HGB 8.7*   < > 7.8*   < > 8.1* 8.2* 6.3* 10.1* 10.7*  HCT 26.9*   < > 24.3*   < > 25.1* 25.0* 19.4* 30.3* 32.3*  MCV 91.8   < > 92.4  --  91.6 90.9 91.5  --  88.7  PLT 334   < > 368  --  362 326 196  --  255   < > = values in this interval not displayed.    Basic Metabolic Panel: Recent Labs  Lab 05/16/20 0856 05/16/20 0856 05/16/20 1218 05/17/20 0810 05/18/20 0556 05/19/20 0553 05/20/20 0502  NA 136  --   --  133* 134* 135 136  K 3.6  --   --  3.6 3.2* 3.1* 4.7  CL 104  --   --  105 104 105 101  CO2 21*  --   --  23 21* 25 29  GLUCOSE 94  --   --  149* 209* 80 87  BUN 37*  --   --  18 12 7 7   CREATININE 0.45   < > 0.35* 0.42* 0.33* 0.32* 0.34*  CALCIUM 7.3*  --   --  7.2* 7.6* 8.3* 8.2*  MG  --   --   --  1.3* 2.0 1.7 2.0  PHOS  --   --   --   --  3.2 2.1* 3.5   < > = values in this interval not displayed.    GFR: Estimated Creatinine Clearance: 62.9 mL/min (A) (by C-G formula based on SCr of 0.34 mg/dL (L)).  Liver Function Tests: Recent Labs  Lab 05/16/20 0856 05/17/20 0810 05/18/20 0556 05/19/20 0553 05/20/20 0502  AST 16 11* 10*  --   --   ALT 11 11 9   --   --   ALKPHOS 50 55 57  --   --   BILITOT 0.5 0.4 0.7  --   --   PROT 4.0* 3.6* 3.6*  --   --   ALBUMIN 1.5* 1.4* 1.4* 2.7* 2.7*    CBG: No results for input(s): GLUCAP in the last 168 hours.   Recent Results (from the past 240 hour(s))  Respiratory Panel by RT PCR (Flu A&B, Covid) - Nasopharyngeal Swab     Status: None   Collection Time: 05/16/20 10:54 AM   Specimen: Nasopharyngeal Swab  Result Value Ref Range Status   SARS Coronavirus 2 by RT PCR NEGATIVE NEGATIVE Final    Comment: (NOTE) SARS-CoV-2 target nucleic acids are  NOT DETECTED.  The SARS-CoV-2 RNA is generally detectable in upper respiratoy specimens during the acute phase of infection. The lowest concentration of SARS-CoV-2 viral copies this assay can detect is 131 copies/mL. A negative result does not preclude SARS-Cov-2 infection and should not be used as the sole basis for treatment or other patient management decisions. A negative result may occur with  improper specimen collection/handling, submission of specimen other than nasopharyngeal swab, presence of viral mutation(s) within the areas targeted by this assay, and inadequate number of viral copies (<131 copies/mL). A negative result must be combined with clinical observations, patient history, and epidemiological information. The expected result is Negative.  Fact Sheet for Patients:  PinkCheek.be  Fact Sheet for Healthcare Providers:  GravelBags.it  This test is no t yet approved or cleared by the [place] FDA and  has been authorized for detection and/or diagnosis of SARS-CoV-2 by FDA under an Emergency Use Authorization (EUA). This EUA will remain  in effect (meaning this test can be used) for the duration of the COVID-19 declaration under Section 564(b)(1) of the Act, 21 U.S.C. section 360bbb-3(b)(1), unless the authorization is terminated or revoked sooner.     Influenza A by PCR NEGATIVE NEGATIVE Final   Influenza B by PCR NEGATIVE NEGATIVE Final  Comment: (NOTE) The Xpert Xpress SARS-CoV-2/FLU/RSV assay is intended as an aid in  the diagnosis of influenza from Nasopharyngeal swab specimens and  should not be used as a sole basis for treatment. Nasal washings and  aspirates are unacceptable for Xpert Xpress SARS-CoV-2/FLU/RSV  testing.  Fact Sheet for Patients: PinkCheek.be  Fact Sheet for Healthcare Providers: GravelBags.it  This test is not yet  approved or cleared by the [place] FDA and  has been authorized for detection and/or diagnosis of SARS-CoV-2 by  FDA under an Emergency Use Authorization (EUA). This EUA will remain  in effect (meaning this test can be used) for the duration of the  Covid-19 declaration under Section 564(b)(1) of the Act, 21  U.S.C. section 360bbb-3(b)(1), unless the authorization is  terminated or revoked. Performed at [facility], [place] [address], [place], [place] [address]   Culture, blood (single)     Status: None (Preliminary result)   Collection Time: 05/16/20 11:04 AM   Specimen: BLOOD  Result Value Ref Range Status   Specimen Description   Final    BLOOD LEFT ANTECUBITAL Performed at [place] [address], [place], [place] [address]    Special Requests   Final    BOTTLES DRAWN AEROBIC AND ANAEROBIC Blood Culture adequate volume Performed at [place] [address], [place], [place] [address]    Culture   Final    NO GROWTH 4 DAYS Performed at [facility], [place] [address], [place], [place] [address]    Report Status PENDING  Incomplete  Urine culture     Status: Abnormal   Collection Time: 05/16/20  5:14 PM   Specimen: Urine, Random  Result Value Ref Range Status   Specimen Description   Final    URINE, RANDOM Performed at [place] [address], [place], [place] [address]    Special Requests   Final    NONE Performed at [facility], [place] [address] [place], [place] [address]    Culture (A)  Final    >=100,000 COLONIES/mL PROTEUS MIRABILIS >=100,000 COLONIES/mL ESCHERICHIA COLI    Report Status 05/19/2020 FINAL  Final   Organism ID, Bacteria PROTEUS MIRABILIS (A)  Final   Organism ID, Bacteria ESCHERICHIA COLI (A)  Final      Susceptibility   Escherichia coli - MIC*    AMPICILLIN <=2 SENSITIVE Sensitive     CEFAZOLIN <=4 SENSITIVE Sensitive      CEFTRIAXONE <=0.25 SENSITIVE Sensitive     CIPROFLOXACIN >=4 RESISTANT Resistant     GENTAMICIN <=1 SENSITIVE Sensitive     IMIPENEM <=0.25 SENSITIVE Sensitive     NITROFURANTOIN <=16 SENSITIVE Sensitive     TRIMETH/SULFA <=20 SENSITIVE Sensitive     AMPICILLIN/SULBACTAM <=2 SENSITIVE Sensitive     PIP/TAZO <=4 SENSITIVE Sensitive     * >=100,000 COLONIES/mL ESCHERICHIA COLI   Proteus mirabilis - MIC*    AMPICILLIN <=2 SENSITIVE Sensitive     CEFAZOLIN <=4 SENSITIVE Sensitive     CEFTRIAXONE <=0.25 SENSITIVE Sensitive     CIPROFLOXACIN <=0.25 SENSITIVE Sensitive     GENTAMICIN <=1 SENSITIVE Sensitive     IMIPENEM 0.5 SENSITIVE Sensitive     NITROFURANTOIN RESISTANT Resistant     TRIMETH/SULFA <=20 SENSITIVE Sensitive     AMPICILLIN/SULBACTAM <=2 SENSITIVE Sensitive     PIP/TAZO <=4 SENSITIVE Sensitive     * >=100,000 COLONIES/mL PROTEUS MIRABILIS  Gastrointestinal Panel by PCR , Stool  Status: None   Collection Time: 05/16/20  5:14 PM   Specimen: Stool  Result Value Ref Range Status   Campylobacter species NOT DETECTED NOT DETECTED Final   Plesimonas shigelloides NOT DETECTED NOT DETECTED Final   Salmonella species NOT DETECTED NOT DETECTED Final   Yersinia enterocolitica NOT DETECTED NOT DETECTED Final   Vibrio species NOT DETECTED NOT DETECTED Final   Vibrio cholerae NOT DETECTED NOT DETECTED Final   Enteroaggregative E coli (EAEC) NOT DETECTED NOT DETECTED Final   Enteropathogenic E coli (EPEC) NOT DETECTED NOT DETECTED Final   Enterotoxigenic E coli (ETEC) NOT DETECTED NOT DETECTED Final   Shiga like toxin producing E coli (STEC) NOT DETECTED NOT DETECTED Final   Shigella/Enteroinvasive E coli (EIEC) NOT DETECTED NOT DETECTED Final   Cryptosporidium NOT DETECTED NOT DETECTED Final   Cyclospora cayetanensis NOT DETECTED NOT DETECTED Final   Entamoeba histolytica NOT DETECTED NOT DETECTED Final   Giardia lamblia NOT DETECTED NOT DETECTED Final   Adenovirus F40/41 NOT  DETECTED NOT DETECTED Final   Astrovirus NOT DETECTED NOT DETECTED Final   Norovirus GI/GII NOT DETECTED NOT DETECTED Final   Rotavirus A NOT DETECTED NOT DETECTED Final   Sapovirus (I, II, IV, and V) NOT DETECTED NOT DETECTED Final    Comment: Performed at [facility], [address], [place], [place] [address]         Radiology Studies: No results found.      Scheduled Meds: . (feeding supplement) PROSource Plus  30 mL Oral Daily  . amoxicillin-clavulanate  1 tablet Oral Q12H  . feeding supplement  1 Container Oral BID BM  . feeding supplement (KATE FARMS STANDARD 1.4)  325 mL Oral Daily  . multivitamin with minerals  1 tablet Oral Daily  . pantoprazole (PROTONIX) IV  40 mg Intravenous Q12H   Continuous Infusions:    LOS: 4 days    Time spent: 40 minutes    Irine Seal, MD [facility]   To contact the attending provider between 7A-7P or the covering provider during after hours 7P-7A, please log into the web site [email or web address] and access using universal [place] password for that web site. If you do not have the password, please call the hospital operator.  05/20/2020, 1:14 PM

## 2020-05-20 NOTE — H&P (Signed)
  The patient presents to the endoscopy unit today for EGD to evaluate for dark-colored stools and drop in hemoglobin and hematocrit.  She feels fine today.  Physical:  No distress  Alert and oriented  Heart regular rhythm no murmurs  Lungs clear  Abdomen soft and nontender  Impression: Dark-colored heme positive stool with drop in hemoglobin and hematocrit  Plan: EGD

## 2020-05-20 NOTE — Progress Notes (Signed)
OT Cancellation Note  Patient Details Name: Stacy [name] MRN: [id number] DOB: 16-Jan-1981   Cancelled Treatment:    Reason Eval/Treat Not Completed: Patient at procedure or test/unavailable  Sharai Overbay, Mickel Baas, OT Acute Rehabilitation Services [phone] [phone]    05/20/2020, 1:11 PM

## 2020-05-21 ENCOUNTER — Other Ambulatory Visit: Payer: Self-pay

## 2020-05-21 ENCOUNTER — Encounter (HOSPITAL_COMMUNITY): Payer: Self-pay | Admitting: Gastroenterology

## 2020-05-21 DIAGNOSIS — E869 Volume depletion, unspecified: Secondary | ICD-10-CM | POA: Diagnosis not present

## 2020-05-21 LAB — RENAL FUNCTION PANEL
Albumin: 2.5 g/dL — ABNORMAL LOW (ref 3.5–5.0)
Anion gap: 7 (ref 5–15)
BUN: 8 mg/dL (ref 6–20)
CO2: 31 mmol/L (ref 22–32)
Calcium: 8.2 mg/dL — ABNORMAL LOW (ref 8.9–10.3)
Chloride: 99 mmol/L (ref 98–111)
Creatinine, Ser: 0.38 mg/dL — ABNORMAL LOW (ref 0.44–1.00)
GFR calc non Af Amer: >60 mL/min (ref >60)
Glucose, Bld: 99 mg/dL (ref 70–99)
Phosphorus: 3.4 mg/dL (ref 2.5–4.6)
Potassium: 3.8 mmol/L (ref 3.5–5.1)
Sodium: 137 mmol/L (ref 135–145)

## 2020-05-21 LAB — CBC
HCT: 34.8 % — ABNORMAL LOW (ref 36.0–46.0)
Hemoglobin: 11.3 g/dL — ABNORMAL LOW (ref 12.0–15.0)
MCH: 28.8 pg (ref 26.0–34.0)
MCHC: 32.5 g/dL (ref 30.0–36.0)
MCV: 88.8 fL (ref 80.0–100.0)
Platelets: 284 10*3/uL (ref 150–400)
RBC: 3.92 MIL/uL (ref 3.87–5.11)
RDW: 16.6 % — ABNORMAL HIGH (ref 11.5–15.5)
WBC: 15.1 10*3/uL — ABNORMAL HIGH (ref 4.0–10.5)
nRBC: 0 % (ref 0.0–0.2)

## 2020-05-21 LAB — CULTURE, BLOOD (SINGLE)
Culture: NO GROWTH
Special Requests: ADEQUATE

## 2020-05-21 LAB — MAGNESIUM: Magnesium: 1.8 mg/dL (ref 1.7–2.4)

## 2020-05-21 LAB — SURGICAL PATHOLOGY

## 2020-05-21 LAB — RETICULIN ANTIBODIES, IGA W TITER: Reticulin Ab, IgA: NEGATIVE titer (ref <2.5)

## 2020-05-21 NOTE — Progress Notes (Signed)
OT Cancellation Note  Patient Details Name: Stacy [name] MRN: [id number] DOB: July 03, 1981   Cancelled Treatment:    Reason Eval/Treat Not Completed: Medical issues which prohibited therapy. Continues to have symptomatic orthostatic hypotension per RN. Will continue to hold and follow up with patient when more hemodynamically stable.  Stacy [name] 05/21/2020, 11:26 AM

## 2020-05-21 NOTE — Progress Notes (Signed)
The patient was seen today and states she feels well. She denies abdominal pain nausea or vomiting. She seems to be stable from a GI standpoint at this time. From a GI standpoint I think she could go home and follow-up with Dr. Michail Sermon her gastroenterologist after discharge. Other medical problems are being addressed by the primary team. We will sign off. Call us if needed.

## 2020-05-21 NOTE — Progress Notes (Addendum)
PROGRESS NOTE    [facility]  JJK:[id number] DOB: 06/04/1981 DOA: 05/16/2020 PCP: [name], MD     Brief Narrative:  Stacy [name] is a 39 yr old female. She states that she used to struggle with weight loss. However, about 7 years ago she began losing weight without trying. She had intermittent nausea and vomiting and frequently not being able to eat. She continues to lose weight. She says it is like a nightmare. Recently the patient was discharged from this institution after a stay for intestinal pseudo-obstruction.   The patient carries a past medical history significant for anxiety, chronic intestinal pseudo-obstruction, GERD, and FTT.  In the ED she was found to be hypotensive with tachycardia. Temperature, respiratory rate, and O2 saturations were normal. Creatinine is 0.45. Fe 23, TIBC 85, Ferritin is 147, albumin is 1.5, LFT's are normal. Lactic Acid is 2.0 upon presentation, but came down to 1.6 with hydration. WBC is 14.1 (increased bands and neutrophils), and Hemoglobin is 8.7. This is down from 9.2 on 05/01/2020. She is negative for COVID.   She was admitted for hypotension and volume depletion.   New events last 24 hours / Subjective: Feeling well without any complaints.  She did have episode of tachycardia when she got up to the bedside commode.  Repeat orthostatic vital signs were positive after my initial evaluation this morning.  Assessment & Plan:   Principal Problem:   Volume depletion Active Problems:   Underweight   Iron deficiency anemia   Chronic intestinal pseudo-obstruction   GERD (gastroesophageal reflux disease)   Abdominal pain with vomiting   Hypokalemia   Severe protein-calorie malnutrition (HCC)   Failure to thrive in adult   Nausea and vomiting   Sepsis (HCC)   Hypomagnesemia   Constipation   Volume depletion, gastrointestinal loss   Orthostatic hypotension secondary to volume depletion, nausea, vomiting, diarrhea -Unable to  complete cosyntropin stim test due to starting on IV Solu-Cortef on admission -Volume depletion thought to be secondary to ongoing GI loss -Continue midodrine, repeat orthostatic vital signs in the morning  Proteus mirabilis, E. coli UTI -Augmentin  Cyclical vomiting syndrome -Appreciate GI, follow-up with them outpatient  Iron deficiency anemia -Transfused 2u pRBC 10/4 -Status post EGD 10/5 which showed normal esophagus, normal duodenum, erythematous mucosa in the stomach -IV Feraheme given -PPI  Severe protein calorie malnutrition -Appreciate dietitian    DVT prophylaxis:  Place and maintain sequential compression device Start: 05/19/20 0943 Place TED hose Start: 05/17/20 1315  Code Status: Full code Family Communication: At bedside Disposition Plan:  Status is: Inpatient  Remains inpatient appropriate because:Hemodynamically unstable   Dispo: The patient is from: Home              Anticipated d/c is to: Home              Anticipated d/c date is: 1 day              Patient currently is not medically stable to d/c.  Orthostatic positive this morning.  Continue Midodrine and repeat orthostatic vital signs in the morning.   Antimicrobials:  Anti-infectives (From admission, onward)   Start     Dose/Rate Route Frequency Ordered Stop   05/19/20 1315  amoxicillin-clavulanate (AUGMENTIN) 875-125 MG per tablet 1 tablet        1 tablet Oral Every 12 hours 05/19/20 1219 05/24/20 0959   05/16/20 2000  piperacillin-tazobactam (ZOSYN) IVPB 3.375 g  Status:  Discontinued  3.375 g 12.5 mL/hr over 240 Minutes Intravenous Every 8 hours 05/16/20 1851 05/19/20 1219   05/16/20 1800  piperacillin-tazobactam (ZOSYN) IVPB 3.375 g  Status:  Discontinued        3.375 g 12.5 mL/hr over 240 Minutes Intravenous Every 8 hours 05/16/20 1741 05/16/20 1851        Objective: Vitals:   05/21/20 0457 05/21/20 1047 05/21/20 1100 05/21/20 1325  BP: 104/69 96/60  92/62  Pulse: 88 (!) 101   (!) 102  Resp: 18  15 16   Temp: 98.5 F (36.9 C) 98.4 F (36.9 C)  98.3 F (36.8 C)  TempSrc: Oral Oral  Oral  SpO2: 98% 99%  99%  Weight:      Height:        Intake/Output Summary (Last 24 hours) at 05/21/2020 1447 Last data filed at 05/21/2020 1400 Gross per 24 hour  Intake 1080 ml  Output --  Net 1080 ml   Filed Weights   05/16/20 0841 05/20/20 0849  Weight: 42.6 kg 42.6 kg    Examination:  General exam: Appears calm and comfortable  Respiratory system: Clear to auscultation. Respiratory effort normal. No respiratory distress. No conversational dyspnea.  Cardiovascular system: S1 & S2 heard, RRR. No murmurs. No pedal edema. Gastrointestinal system: Abdomen is nondistended, soft and nontender. Normal bowel sounds heard. Central nervous system: Alert and oriented. No focal neurological deficits. Speech clear.  Extremities: Symmetric in appearance  Skin: No rashes, lesions or ulcers on exposed skin  Psychiatry: Judgement and insight appear normal. Mood & affect appropriate.   Data Reviewed: I have personally reviewed following labs and imaging studies  CBC: Recent Labs  Lab 05/16/20 0856 05/16/20 1218 05/17/20 1418 05/17/20 1418 05/18/20 0556 05/19/20 0553 05/19/20 2053 05/20/20 0502 05/21/20 0408  WBC 14.1*   < > 8.7  --  9.5 11.5*  --  10.7* 15.1*  NEUTROABS 11.9*  --  6.4  --  6.9  --   --   --   --   HGB 8.7*   < > 8.1*   < > 8.2* 6.3* 10.1* 10.7* 11.3*  HCT 26.9*   < > 25.1*   < > 25.0* 19.4* 30.3* 32.3* 34.8*  MCV 91.8   < > 91.6  --  90.9 91.5  --  88.7 88.8  PLT 334   < > 362  --  326 196  --  255 284   < > = values in this interval not displayed.   Basic Metabolic Panel: Recent Labs  Lab 05/17/20 0810 05/18/20 0556 05/19/20 0553 05/20/20 0502 05/21/20 0408  NA 133* 134* 135 136 137  K 3.6 3.2* 3.1* 4.7 3.8  CL 105 104 105 101 99  CO2 23 21* 25 29 31   GLUCOSE 149* 209* 80 87 99  BUN 18 12 7 7 8   CREATININE 0.42* 0.33* 0.32* 0.34* 0.38*   CALCIUM 7.2* 7.6* 8.3* 8.2* 8.2*  MG 1.3* 2.0 1.7 2.0 1.8  PHOS  --  3.2 2.1* 3.5 3.4   GFR: Estimated Creatinine Clearance: 62.9 mL/min (A) (by C-G formula based on SCr of 0.38 mg/dL (L)). Liver Function Tests: Recent Labs  Lab 05/16/20 0856 05/16/20 0856 05/17/20 0810 05/18/20 0556 05/19/20 0553 05/20/20 0502 05/21/20 0408  AST 16  --  11* 10*  --   --   --   ALT 11  --  11 9  --   --   --   ALKPHOS 50  --  55 57  --   --   --  BILITOT 0.5  --  0.4 0.7  --   --   --   PROT 4.0*  --  3.6* 3.6*  --   --   --   ALBUMIN 1.5*   < > 1.4* 1.4* 2.7* 2.7* 2.5*   < > = values in this interval not displayed.   No results for input(s): LIPASE, AMYLASE in the last 168 hours. No results for input(s): AMMONIA in the last 168 hours. Coagulation Profile: No results for input(s): INR, PROTIME in the last 168 hours. Cardiac Enzymes: No results for input(s): CKTOTAL, CKMB, CKMBINDEX, TROPONINI in the last 168 hours. BNP (last 3 results) No results for input(s): PROBNP in the last 8760 hours. HbA1C: No results for input(s): HGBA1C in the last 72 hours. CBG: No results for input(s): GLUCAP in the last 168 hours. Lipid Profile: No results for input(s): CHOL, HDL, LDLCALC, TRIG, CHOLHDL, LDLDIRECT in the last 72 hours. Thyroid Function Tests: No results for input(s): TSH, T4TOTAL, FREET4, T3FREE, THYROIDAB in the last 72 hours. Anemia Panel: No results for input(s): VITAMINB12, FOLATE, FERRITIN, TIBC, IRON, RETICCTPCT in the last 72 hours. Sepsis Labs: Recent Labs  Lab 05/16/20 0856 05/16/20 1200  LATICACIDVEN 2.0* 1.6    Recent Results (from the past 240 hour(s))  Respiratory Panel by RT PCR (Flu A&B, Covid) - Nasopharyngeal Swab     Status: None   Collection Time: 05/16/20 10:54 AM   Specimen: Nasopharyngeal Swab  Result Value Ref Range Status   SARS Coronavirus 2 by RT PCR NEGATIVE NEGATIVE Final    Comment: (NOTE) SARS-CoV-2 target nucleic acids are NOT DETECTED.  The  SARS-CoV-2 RNA is generally detectable in upper respiratoy specimens during the acute phase of infection. The lowest concentration of SARS-CoV-2 viral copies this assay can detect is 131 copies/mL. A negative result does not preclude SARS-Cov-2 infection and should not be used as the sole basis for treatment or other patient management decisions. A negative result may occur with  improper specimen collection/handling, submission of specimen other than nasopharyngeal swab, presence of viral mutation(s) within the areas targeted by this assay, and inadequate number of viral copies (<131 copies/mL). A negative result must be combined with clinical observations, patient history, and epidemiological information. The expected result is Negative.  Fact Sheet for Patients:  PinkCheek.be  Fact Sheet for Healthcare Providers:  GravelBags.it  This test is no t yet approved or cleared by the [place] FDA and  has been authorized for detection and/or diagnosis of SARS-CoV-2 by FDA under an Emergency Use Authorization (EUA). This EUA will remain  in effect (meaning this test can be used) for the duration of the COVID-19 declaration under Section 564(b)(1) of the Act, 21 U.S.C. section 360bbb-3(b)(1), unless the authorization is terminated or revoked sooner.     Influenza A by PCR NEGATIVE NEGATIVE Final   Influenza B by PCR NEGATIVE NEGATIVE Final    Comment: (NOTE) The Xpert Xpress SARS-CoV-2/FLU/RSV assay is intended as an aid in  the diagnosis of influenza from Nasopharyngeal swab specimens and  should not be used as a sole basis for treatment. Nasal washings and  aspirates are unacceptable for Xpert Xpress SARS-CoV-2/FLU/RSV  testing.  Fact Sheet for Patients: PinkCheek.be  Fact Sheet for Healthcare Providers: GravelBags.it  This test is not yet approved or cleared  by the [place] FDA and  has been authorized for detection and/or diagnosis of SARS-CoV-2 by  FDA under an Emergency Use Authorization (EUA). This EUA will remain  in effect (  meaning this test can be used) for the duration of the  Covid-19 declaration under Section 564(b)(1) of the Act, 21  U.S.C. section 360bbb-3(b)(1), unless the authorization is  terminated or revoked. Performed at [facility], [place] [address] [place], [place] [address]   Culture, blood (single)     Status: None   Collection Time: 05/16/20 11:04 AM   Specimen: BLOOD  Result Value Ref Range Status   Specimen Description   Final    BLOOD LEFT ANTECUBITAL Performed at [facility] [address], [place], [place] [address]    Special Requests   Final    BOTTLES DRAWN AEROBIC AND ANAEROBIC Blood Culture adequate volume Performed at [facility] [address], [place], [place] [address]    Culture   Final    NO GROWTH 5 DAYS Performed at [facility], [place] [address], [place], [place] [address]    Report Status 05/21/2020 FINAL  Final  Urine culture     Status: Abnormal   Collection Time: 05/16/20  5:14 PM   Specimen: Urine, Random  Result Value Ref Range Status   Specimen Description   Final    URINE, RANDOM Performed at [facility] [address], [place], [place] [address]    Special Requests   Final    NONE Performed at [facility], [place] [address], [place],  [address]    Culture (A)  Final    >=100,000 COLONIES/mL PROTEUS MIRABILIS >=100,000 COLONIES/mL ESCHERICHIA COLI    Report Status 05/19/2020 FINAL  Final   Organism ID, Bacteria PROTEUS MIRABILIS (A)  Final   Organism ID, Bacteria ESCHERICHIA COLI (A)  Final      Susceptibility   Escherichia coli - MIC*    AMPICILLIN <=2 SENSITIVE Sensitive     CEFAZOLIN <=4 SENSITIVE Sensitive     CEFTRIAXONE <=0.25 SENSITIVE Sensitive      CIPROFLOXACIN >=4 RESISTANT Resistant     GENTAMICIN <=1 SENSITIVE Sensitive     IMIPENEM <=0.25 SENSITIVE Sensitive     NITROFURANTOIN <=16 SENSITIVE Sensitive     TRIMETH/SULFA <=20 SENSITIVE Sensitive     AMPICILLIN/SULBACTAM <=2 SENSITIVE Sensitive     PIP/TAZO <=4 SENSITIVE Sensitive     * >=100,000 COLONIES/mL ESCHERICHIA COLI   Proteus mirabilis - MIC*    AMPICILLIN <=2 SENSITIVE Sensitive     CEFAZOLIN <=4 SENSITIVE Sensitive     CEFTRIAXONE <=0.25 SENSITIVE Sensitive     CIPROFLOXACIN <=0.25 SENSITIVE Sensitive     GENTAMICIN <=1 SENSITIVE Sensitive     IMIPENEM 0.5 SENSITIVE Sensitive     NITROFURANTOIN RESISTANT Resistant     TRIMETH/SULFA <=20 SENSITIVE Sensitive     AMPICILLIN/SULBACTAM <=2 SENSITIVE Sensitive     PIP/TAZO <=4 SENSITIVE Sensitive     * >=100,000 COLONIES/mL PROTEUS MIRABILIS  Gastrointestinal Panel by PCR , Stool     Status: None   Collection Time: 05/16/20  5:14 PM   Specimen: Stool  Result Value Ref Range Status   Campylobacter species NOT DETECTED NOT DETECTED Final   Plesimonas shigelloides NOT DETECTED NOT DETECTED Final   Salmonella species NOT DETECTED NOT DETECTED Final   Yersinia enterocolitica NOT DETECTED NOT DETECTED Final   Vibrio species NOT DETECTED NOT DETECTED Final   Vibrio cholerae NOT DETECTED NOT DETECTED Final   Enteroaggregative E coli (EAEC) NOT DETECTED NOT DETECTED Final   Enteropathogenic E coli (EPEC) NOT DETECTED NOT DETECTED Final   Enterotoxigenic E coli (  ETEC) NOT DETECTED NOT DETECTED Final   Shiga like toxin producing E coli (STEC) NOT DETECTED NOT DETECTED Final   Shigella/Enteroinvasive E coli (EIEC) NOT DETECTED NOT DETECTED Final   Cryptosporidium NOT DETECTED NOT DETECTED Final   Cyclospora cayetanensis NOT DETECTED NOT DETECTED Final   Entamoeba histolytica NOT DETECTED NOT DETECTED Final   Giardia lamblia NOT DETECTED NOT DETECTED Final   Adenovirus F40/41 NOT DETECTED NOT DETECTED Final   Astrovirus NOT  DETECTED NOT DETECTED Final   Norovirus GI/GII NOT DETECTED NOT DETECTED Final   Rotavirus A NOT DETECTED NOT DETECTED Final   Sapovirus (I, II, IV, and V) NOT DETECTED NOT DETECTED Final    Comment: Performed at [facility], [address], [place], [place] [address]      Radiology Studies: No results found.    Scheduled Meds: . (feeding supplement) PROSource Plus  30 mL Oral Daily  . amoxicillin-clavulanate  1 tablet Oral Q12H  . feeding supplement  1 Container Oral BID BM  . feeding supplement (KATE FARMS STANDARD 1.4)  325 mL Oral Daily  . midodrine  5 mg Oral TID WC  . multivitamin with minerals  1 tablet Oral Daily  . pantoprazole  40 mg Oral BID AC   Continuous Infusions:   LOS: 5 days      Time spent: 40 minutes   Dessa Phi, DO [facility] 05/21/2020, 2:47 PM   Available via Epic secure chat 7am-7pm After these hours, please refer to coverage provider listed on [email or web address]

## 2020-05-21 NOTE — Evaluation (Signed)
Physical Therapy Evaluation Patient Details Name: Stacy [name] MRN: [id number] DOB: May 30, 1981 Today's Date: 05/21/2020   History of Present Illness  39 yo female admitted with volume depletion, hypotension. Hx of anxiety, GERD, FTT, chronic intestinal pseudo obstruction.  Clinical Impression  On eval, pt was Min guard assist for mobility. She walked ~20 feet x 2 around the room. See vitals section for orthostatics. BP reading (while standing after ambulating): 76/49. Pt denied lightheadedness/dizziness however did not push ambulation distance for safety reasons. Will continue to follow and progress activity as tolerated. Pt and husband are agreeable to HHPT f/u.     Follow Up Recommendations Home health PT;Supervision for mobility/OOB    Equipment Recommendations  None recommended by PT    Recommendations for Other Services OT consult     Precautions / Restrictions Precautions Precaution Comments: orthostatic hypotension Restrictions Weight Bearing Restrictions: No      Mobility  Bed Mobility Overal bed mobility: Modified Independent                Transfers Overall transfer level: Needs assistance   Transfers: Sit to/from Stand Sit to Stand: Supervision            Ambulation/Gait Ambulation/Gait assistance: Min guard Gait Distance (Feet): 20 Feet (x2) Assistive device: None Gait Pattern/deviations: Step-through pattern;Decreased stride length     General Gait Details: Pt walked around room x 2 with seated rest break in between. BP while standing after walk: 76/49  Stairs            Wheelchair Mobility    Modified Rankin (Stroke Patients Only)       Balance Overall balance assessment: Needs assistance           Standing balance-Leahy Scale: Fair                               Pertinent Vitals/Pain Pain Assessment: No/denies pain    Home Living Family/patient expects to be discharged to:: Private residence Living  Arrangements: Spouse/significant other Available Help at Discharge: Family Type of Home: House Home Access: Stairs to enter   Technical brewer of Steps: 2 Home Layout: Able to live on main level with bedroom/bathroom Home Equipment: Environmental consultant - 4 wheels (borrowed)      Prior Function Level of Independence: Independent               Hand Dominance        Extremity/Trunk Assessment   Upper Extremity Assessment Upper Extremity Assessment: Defer to OT evaluation    Lower Extremity Assessment Lower Extremity Assessment: Generalized weakness    Cervical / Trunk Assessment Cervical / Trunk Assessment: Normal  Communication   Communication: No difficulties  Cognition Arousal/Alertness: Awake/alert Behavior During Therapy: WFL for tasks assessed/performed Overall Cognitive Status: Within Functional Limits for tasks assessed                                        General Comments      Exercises     Assessment/Plan    PT Assessment Patient needs continued PT services  PT Problem List Decreased strength;Decreased mobility;Decreased activity tolerance       PT Treatment Interventions Gait training;Therapeutic activities;Patient/family education;Balance training;Functional mobility training;Therapeutic exercise    PT Goals (Current goals can be found in the Care Plan section)  Acute Rehab PT Goals Patient  Stated Goal: to get better. "i need help" PT Goal Formulation: With patient/family Time For Goal Achievement: 06/04/20 Potential to Achieve Goals: Good    Frequency Min 3X/week   Barriers to discharge        Co-evaluation               AM-PAC PT "6 Clicks" Mobility  Outcome Measure Help needed turning from your back to your side while in a flat bed without using bedrails?: None Help needed moving from lying on your back to sitting on the side of a flat bed without using bedrails?: None Help needed moving to and from a bed to a  chair (including a wheelchair)?: A Little Help needed standing up from a chair using your arms (e.g., wheelchair or bedside chair)?: A Little Help needed to walk in hospital room?: A Little Help needed climbing 3-5 steps with a railing? : A Little 6 Click Score: 20    End of Session Equipment Utilized During Treatment: Gait belt Activity Tolerance: Patient tolerated treatment well Patient left: in bed;with call bell/phone within reach;with family/visitor present   PT Visit Diagnosis: Muscle weakness (generalized) (M62.81);Difficulty in walking, not elsewhere classified (R26.2)    Time: 7741-2878 PT Time Calculation (min) (ACUTE ONLY): 17 min   Charges:   PT Evaluation $PT Eval Low Complexity: [place], PT Acute Rehabilitation  Office: [phone] Pager: [phone]

## 2020-05-22 DIAGNOSIS — E869 Volume depletion, unspecified: Secondary | ICD-10-CM | POA: Diagnosis not present

## 2020-05-22 LAB — CBC
HCT: 35.4 % — ABNORMAL LOW (ref 36.0–46.0)
Hemoglobin: 10.6 g/dL — ABNORMAL LOW (ref 12.0–15.0)
MCH: 29.3 pg (ref 26.0–34.0)
MCHC: 29.9 g/dL — ABNORMAL LOW (ref 30.0–36.0)
MCV: 97.8 fL (ref 80.0–100.0)
Platelets: 254 10*3/uL (ref 150–400)
RBC: 3.62 MIL/uL — ABNORMAL LOW (ref 3.87–5.11)
RDW: 17.2 % — ABNORMAL HIGH (ref 11.5–15.5)
WBC: 15.5 10*3/uL — ABNORMAL HIGH (ref 4.0–10.5)
nRBC: 0 % (ref 0.0–0.2)

## 2020-05-22 LAB — BASIC METABOLIC PANEL
Anion gap: 7 (ref 5–15)
BUN: 11 mg/dL (ref 6–20)
CO2: 30 mmol/L (ref 22–32)
Calcium: 8.5 mg/dL — ABNORMAL LOW (ref 8.9–10.3)
Chloride: 99 mmol/L (ref 98–111)
Creatinine, Ser: 0.37 mg/dL — ABNORMAL LOW (ref 0.44–1.00)
GFR calc non Af Amer: >60 mL/min (ref >60)
Glucose, Bld: 89 mg/dL (ref 70–99)
Potassium: 3.9 mmol/L (ref 3.5–5.1)
Sodium: 136 mmol/L (ref 135–145)

## 2020-05-22 LAB — MAGNESIUM: Magnesium: 1.9 mg/dL (ref 1.7–2.4)

## 2020-05-22 MED ORDER — SODIUM CHLORIDE 0.9 % IV SOLN: Freq: Once | INTRAVENOUS | Status: AC

## 2020-05-22 MED ORDER — MIDODRINE HCL 5 MG PO TABS
10.0000 mg | ORAL_TABLET | Freq: Three times a day (TID) | ORAL | Status: DC
  Administered 2020-05-22 – 2020-05-28 (×21): 10 mg via ORAL
  Filled 2020-05-22 (×20): qty 2

## 2020-05-22 MED ORDER — INFLUENZA VAC SPLIT QUAD 0.5 ML IM SUSY
0.5000 mL | PREFILLED_SYRINGE | Freq: Once | INTRAMUSCULAR | Status: AC
  Administered 2020-05-22: 0.5 mL via INTRAMUSCULAR
  Filled 2020-05-22: qty 0.5

## 2020-05-22 NOTE — Progress Notes (Signed)
PROGRESS NOTE    JOLISSA KAPRAL  GXQ:[id number] DOB: 04/26/1981 DOA: 05/16/2020 PCP: [name], MD     Brief Narrative:  Stacy [name] is a 39 yr old female. She states that she used to struggle with weight loss. However, about 7 years ago she began losing weight without trying. She had intermittent nausea and vomiting and frequently not being able to eat. She continues to lose weight. She says it is like a nightmare. Recently the patient was discharged from this institution after a stay for intestinal pseudo-obstruction.   The patient carries a past medical history significant for anxiety, chronic intestinal pseudo-obstruction, GERD, and FTT.  In the ED she was found to be hypotensive with tachycardia. Temperature, respiratory rate, and O2 saturations were normal. Creatinine is 0.45. Fe 23, TIBC 85, Ferritin is 147, albumin is 1.5, LFT's are normal. Lactic Acid is 2.0 upon presentation, but came down to 1.6 with hydration. WBC is 14.1 (increased bands and neutrophils), and Hemoglobin is 8.7. This is down from 9.2 on 05/01/2020. She is negative for COVID.   She was admitted for hypotension and volume depletion.   New events last 24 hours / Subjective: Positive orthostatic vital sign this morning blood pressure 107/75 --> 71/47.  On second attempt, patient did have symptoms and needed to sit down.  Otherwise feeling well at rest.  Not having any further nausea or vomiting.  We discussed sodium liberal diet.  Patient also requested to get the flu vaccine in hospital.  Discussed that patient did not meet criteria for Covid booster shot as she is not immunocompromised and age only 77.  Her and her spouse are both fully vaccinated with [place]   Assessment & Plan:   Principal Problem:   Volume depletion Active Problems:   Underweight   Iron deficiency anemia   Chronic intestinal pseudo-obstruction   GERD (gastroesophageal reflux disease)   Abdominal pain with vomiting    Hypokalemia   Severe protein-calorie malnutrition (HCC)   Failure to thrive in adult   Nausea and vomiting   Sepsis (HCC)   Hypomagnesemia   Constipation   Volume depletion, gastrointestinal loss   Orthostatic hypotension secondary to volume depletion, nausea, vomiting, diarrhea -Unable to complete cosyntropin stim test due to starting on IV Solu-Cortef on admission -Volume depletion thought to be secondary to ongoing GI loss -Continue midodrine, increased dose today 10 mg 3 times daily.  Will also give IV fluid today. Could also trial fludrocortisone if no improvement.  Repeat orthostatic vital signs in the morning  Proteus mirabilis, E. coli UTI -Augmentin  Cyclical vomiting syndrome -Appreciate GI, follow-up with them outpatient  Iron deficiency anemia -Transfused 2u pRBC 10/4 -Status post EGD 10/5 which showed normal esophagus, normal duodenum, erythematous mucosa in the stomach -IV Feraheme given -PPI  Severe protein calorie malnutrition -Appreciate dietitian    DVT prophylaxis:  Place and maintain sequential compression device Start: 05/19/20 0943 Place TED hose Start: 05/17/20 1315  Code Status: Full code Family Communication: At bedside Disposition Plan:  Status is: Inpatient  Remains inpatient appropriate because:Hemodynamically unstable   Dispo: The patient is from: Home              Anticipated d/c is to: Home              Anticipated d/c date is: 1 day              Patient currently is not medically stable to d/c.  Orthostatic positive this morning.  Continue Midodrine at an increased dose, IV fluid, and repeat orthostatic vital signs in the morning.   Antimicrobials:  Anti-infectives (From admission, onward)   Start     Dose/Rate Route Frequency Ordered Stop   05/19/20 1315  amoxicillin-clavulanate (AUGMENTIN) 875-125 MG per tablet 1 tablet        1 tablet Oral Every 12 hours 05/19/20 1219 05/24/20 0959   05/16/20 2000  piperacillin-tazobactam  (ZOSYN) IVPB 3.375 g  Status:  Discontinued        3.375 g 12.5 mL/hr over 240 Minutes Intravenous Every 8 hours 05/16/20 1851 05/19/20 1219   05/16/20 1800  piperacillin-tazobactam (ZOSYN) IVPB 3.375 g  Status:  Discontinued        3.375 g 12.5 mL/hr over 240 Minutes Intravenous Every 8 hours 05/16/20 1741 05/16/20 1851       Objective: Vitals:   05/21/20 1808 05/21/20 2030 05/22/20 0159 05/22/20 0642  BP: (!) 117/97 101/76 109/82 107/75  Pulse: 82 96 99 (!) 107  Resp: 18 16 16 18   Temp: 98.8 F (37.1 C) 97.6 F (36.4 C) 98 F (36.7 C) 98.6 F (37 C)  TempSrc: Oral Oral  Oral  SpO2: 96% 98% 99% 98%  Weight:      Height:        Intake/Output Summary (Last 24 hours) at 05/22/2020 1154 Last data filed at 05/21/2020 1800 Gross per 24 hour  Intake 840 ml  Output --  Net 840 ml   Filed Weights   05/16/20 0841 05/20/20 0849  Weight: 42.6 kg 42.6 kg    Examination: General exam: Appears calm and comfortable, thin Respiratory system: Clear to auscultation. Respiratory effort normal. Cardiovascular system: S1 & S2 heard, RRR. No pedal edema. Gastrointestinal system: Abdomen is nondistended, soft and nontender. Normal bowel sounds heard. Central nervous system: Alert and oriented. Non focal exam. Speech clear  Extremities: Symmetric in appearance bilaterally  Skin: No rashes, lesions or ulcers on exposed skin  Psychiatry: Judgement and insight appear stable. Mood & affect appropriate.    Data Reviewed: I have personally reviewed following labs and imaging studies  CBC: Recent Labs  Lab 05/16/20 0856 05/16/20 1218 05/17/20 1418 05/17/20 1418 05/18/20 0556 05/18/20 0556 05/19/20 0553 05/19/20 2053 05/20/20 0502 05/21/20 0408 05/22/20 0417  WBC 14.1*   < > 8.7   < > 9.5  --  11.5*  --  10.7* 15.1* 15.5*  NEUTROABS 11.9*  --  6.4  --  6.9  --   --   --   --   --   --   HGB 8.7*   < > 8.1*   < > 8.2*   < > 6.3* 10.1* 10.7* 11.3* 10.6*  HCT 26.9*   < > 25.1*   < >  25.0*   < > 19.4* 30.3* 32.3* 34.8* 35.4*  MCV 91.8   < > 91.6   < > 90.9  --  91.5  --  88.7 88.8 97.8  PLT 334   < > 362   < > 326  --  196  --  255 284 254   < > = values in this interval not displayed.   Basic Metabolic Panel: Recent Labs  Lab 05/18/20 0556 05/19/20 0553 05/20/20 0502 05/21/20 0408 05/22/20 0417  NA 134* 135 136 137 136  K 3.2* 3.1* 4.7 3.8 3.9  CL 104 105 101 99 99  CO2 21* 25 29 31 30   GLUCOSE 209* 80 87 99 89  BUN 12 7  7 8 11   CREATININE 0.33* 0.32* 0.34* 0.38* 0.37*  CALCIUM 7.6* 8.3* 8.2* 8.2* 8.5*  MG 2.0 1.7 2.0 1.8 1.9  PHOS 3.2 2.1* 3.5 3.4  --    GFR: Estimated Creatinine Clearance: 62.9 mL/min (A) (by C-G formula based on SCr of 0.37 mg/dL (L)). Liver Function Tests: Recent Labs  Lab 05/16/20 0856 05/16/20 0856 05/17/20 0810 05/18/20 0556 05/19/20 0553 05/20/20 0502 05/21/20 0408  AST 16  --  11* 10*  --   --   --   ALT 11  --  11 9  --   --   --   ALKPHOS 50  --  55 57  --   --   --   BILITOT 0.5  --  0.4 0.7  --   --   --   PROT 4.0*  --  3.6* 3.6*  --   --   --   ALBUMIN 1.5*   < > 1.4* 1.4* 2.7* 2.7* 2.5*   < > = values in this interval not displayed.   No results for input(s): LIPASE, AMYLASE in the last 168 hours. No results for input(s): AMMONIA in the last 168 hours. Coagulation Profile: No results for input(s): INR, PROTIME in the last 168 hours. Cardiac Enzymes: No results for input(s): CKTOTAL, CKMB, CKMBINDEX, TROPONINI in the last 168 hours. BNP (last 3 results) No results for input(s): PROBNP in the last 8760 hours. HbA1C: No results for input(s): HGBA1C in the last 72 hours. CBG: No results for input(s): GLUCAP in the last 168 hours. Lipid Profile: No results for input(s): CHOL, HDL, LDLCALC, TRIG, CHOLHDL, LDLDIRECT in the last 72 hours. Thyroid Function Tests: No results for input(s): TSH, T4TOTAL, FREET4, T3FREE, THYROIDAB in the last 72 hours. Anemia Panel: No results for input(s): VITAMINB12, FOLATE,  FERRITIN, TIBC, IRON, RETICCTPCT in the last 72 hours. Sepsis Labs: Recent Labs  Lab 05/16/20 0856 05/16/20 1200  LATICACIDVEN 2.0* 1.6    Recent Results (from the past 240 hour(s))  Respiratory Panel by RT PCR (Flu A&B, Covid) - Nasopharyngeal Swab     Status: None   Collection Time: 05/16/20 10:54 AM   Specimen: Nasopharyngeal Swab  Result Value Ref Range Status   SARS Coronavirus 2 by RT PCR NEGATIVE NEGATIVE Final    Comment: (NOTE) SARS-CoV-2 target nucleic acids are NOT DETECTED.  The SARS-CoV-2 RNA is generally detectable in upper respiratoy specimens during the acute phase of infection. The lowest concentration of SARS-CoV-2 viral copies this assay can detect is 131 copies/mL. A negative result does not preclude SARS-Cov-2 infection and should not be used as the sole basis for treatment or other patient management decisions. A negative result may occur with  improper specimen collection/handling, submission of specimen other than nasopharyngeal swab, presence of viral mutation(s) within the areas targeted by this assay, and inadequate number of viral copies (<131 copies/mL). A negative result must be combined with clinical observations, patient history, and epidemiological information. The expected result is Negative.  Fact Sheet for Patients:  PinkCheek.be  Fact Sheet for Healthcare Providers:  GravelBags.it  This test is no t yet approved or cleared by the [place] FDA and  has been authorized for detection and/or diagnosis of SARS-CoV-2 by FDA under an Emergency Use Authorization (EUA). This EUA will remain  in effect (meaning this test can be used) for the duration of the COVID-19 declaration under Section 564(b)(1) of the Act, 21 U.S.C. section 360bbb-3(b)(1), unless the authorization is terminated or revoked sooner.  Influenza A by PCR NEGATIVE NEGATIVE Final   Influenza B by PCR NEGATIVE  NEGATIVE Final    Comment: (NOTE) The Xpert Xpress SARS-CoV-2/FLU/RSV assay is intended as an aid in  the diagnosis of influenza from Nasopharyngeal swab specimens and  should not be used as a sole basis for treatment. Nasal washings and  aspirates are unacceptable for Xpert Xpress SARS-CoV-2/FLU/RSV  testing.  Fact Sheet for Patients: PinkCheek.be  Fact Sheet for Healthcare Providers: GravelBags.it  This test is not yet approved or cleared by the [place] FDA and  has been authorized for detection and/or diagnosis of SARS-CoV-2 by  FDA under an Emergency Use Authorization (EUA). This EUA will remain  in effect (meaning this test can be used) for the duration of the  Covid-19 declaration under Section 564(b)(1) of the Act, 21  U.S.C. section 360bbb-3(b)(1), unless the authorization is  terminated or revoked. Performed at [facility], [place] [address], [place], [place] [address]   Culture, blood (single)     Status: None   Collection Time: 05/16/20 11:04 AM   Specimen: BLOOD  Result Value Ref Range Status   Specimen Description   Final    BLOOD LEFT ANTECUBITAL Performed at [facility] [address] [place], [place] [address]    Special Requests   Final    BOTTLES DRAWN AEROBIC AND ANAEROBIC Blood Culture adequate volume Performed at [facility] [address], [place], [place] [address]    Culture   Final    NO GROWTH 5 DAYS Performed at [facility], [place] [address] [place], [place] [address]    Report Status 05/21/2020 FINAL  Final  Urine culture     Status: Abnormal   Collection Time: 05/16/20  5:14 PM   Specimen: Urine, Random  Result Value Ref Range Status   Specimen Description   Final    URINE, RANDOM Performed at [facility] [address], [place], [place] [address]    Special Requests   Final     NONE Performed at [facility], [place] [address] [place],  [address]    Culture (A)  Final    >=100,000 COLONIES/mL PROTEUS MIRABILIS >=100,000 COLONIES/mL ESCHERICHIA COLI    Report Status 05/19/2020 FINAL  Final   Organism ID, Bacteria PROTEUS MIRABILIS (A)  Final   Organism ID, Bacteria ESCHERICHIA COLI (A)  Final      Susceptibility   Escherichia coli - MIC*    AMPICILLIN <=2 SENSITIVE Sensitive     CEFAZOLIN <=4 SENSITIVE Sensitive     CEFTRIAXONE <=0.25 SENSITIVE Sensitive     CIPROFLOXACIN >=4 RESISTANT Resistant     GENTAMICIN <=1 SENSITIVE Sensitive     IMIPENEM <=0.25 SENSITIVE Sensitive     NITROFURANTOIN <=16 SENSITIVE Sensitive     TRIMETH/SULFA <=20 SENSITIVE Sensitive     AMPICILLIN/SULBACTAM <=2 SENSITIVE Sensitive     PIP/TAZO <=4 SENSITIVE Sensitive     * >=100,000 COLONIES/mL ESCHERICHIA COLI   Proteus mirabilis - MIC*    AMPICILLIN <=2 SENSITIVE Sensitive     CEFAZOLIN <=4 SENSITIVE Sensitive     CEFTRIAXONE <=0.25 SENSITIVE Sensitive     CIPROFLOXACIN <=0.25 SENSITIVE Sensitive     GENTAMICIN <=1 SENSITIVE Sensitive     IMIPENEM 0.5 SENSITIVE Sensitive     NITROFURANTOIN RESISTANT Resistant     TRIMETH/SULFA <=20 SENSITIVE Sensitive     AMPICILLIN/SULBACTAM <=2 SENSITIVE Sensitive     PIP/TAZO <=4 SENSITIVE Sensitive     * >=  100,000 COLONIES/mL PROTEUS MIRABILIS  Gastrointestinal Panel by PCR , Stool     Status: None   Collection Time: 05/16/20  5:14 PM   Specimen: Stool  Result Value Ref Range Status   Campylobacter species NOT DETECTED NOT DETECTED Final   Plesimonas shigelloides NOT DETECTED NOT DETECTED Final   Salmonella species NOT DETECTED NOT DETECTED Final   Yersinia enterocolitica NOT DETECTED NOT DETECTED Final   Vibrio species NOT DETECTED NOT DETECTED Final   Vibrio cholerae NOT DETECTED NOT DETECTED Final   Enteroaggregative E coli (EAEC) NOT DETECTED NOT DETECTED Final   Enteropathogenic E coli (EPEC) NOT  DETECTED NOT DETECTED Final   Enterotoxigenic E coli (ETEC) NOT DETECTED NOT DETECTED Final   Shiga like toxin producing E coli (STEC) NOT DETECTED NOT DETECTED Final   Shigella/Enteroinvasive E coli (EIEC) NOT DETECTED NOT DETECTED Final   Cryptosporidium NOT DETECTED NOT DETECTED Final   Cyclospora cayetanensis NOT DETECTED NOT DETECTED Final   Entamoeba histolytica NOT DETECTED NOT DETECTED Final   Giardia lamblia NOT DETECTED NOT DETECTED Final   Adenovirus F40/41 NOT DETECTED NOT DETECTED Final   Astrovirus NOT DETECTED NOT DETECTED Final   Norovirus GI/GII NOT DETECTED NOT DETECTED Final   Rotavirus A NOT DETECTED NOT DETECTED Final   Sapovirus (I, II, IV, and V) NOT DETECTED NOT DETECTED Final    Comment: Performed at [facility], [address], [place], [place] [address]      Radiology Studies: No results found.    Scheduled Meds:  (feeding supplement) PROSource Plus  30 mL Oral Daily   amoxicillin-clavulanate  1 tablet Oral Q12H   feeding supplement  1 Container Oral BID BM   feeding supplement (KATE [place]ARMS STANDARD 1.4)  325 mL Oral Daily   influenza vac split quadrivalent PF  0.5 mL Intramuscular Once   midodrine  10 mg Oral TID WC   multivitamin with minerals  1 tablet Oral Daily   pantoprazole  40 mg Oral BID AC   Continuous Infusions:   LOS: 6 days      Time spent: 35 minutes   Dessa Phi, DO [facility] 05/22/2020, 11:54 AM   Available via Epic secure chat 7am-7pm After these hours, please refer to coverage provider listed on [email or web address]

## 2020-05-22 NOTE — TOC Progression Note (Signed)
Transition of Care [facility]) - Progression Note    Patient Details  Name: Stacy [name] MRN: [id number] Date of Birth: 09/08/1980  Transition of Care [facility]) CM/SW Contact  Reginal Wojcicki, [name], RN Phone Number: 05/22/2020, 4:03 PM  Clinical Narrative:[facility] to delvier 3n1 to rm prior d/c.       Expected Discharge Plan: [place] Barriers to Discharge: Continued Medical Work up  Expected Discharge Plan and Services Expected Discharge Plan: [place] Choice: Durable Medical Equipment, Home Health Living arrangements for the past 2 months: Single Family Home Expected Discharge Date:  (unknown)               DME Arranged: 3-N-1 DME Agency: [facility]                   Social Determinants of Health (SDOH) Interventions    Readmission Risk Interventions No flowsheet data found.

## 2020-05-22 NOTE — TOC Initial Note (Signed)
Transition of Care [facility]) - Initial/Assessment Note    Patient Details  Name: Stacy [name] MRN: [id number] Date of Birth: [birth year]  Transition [facility]) CM/SW Contact:    Dessa Phi, RN Phone Number: 05/22/2020, 2:14 PM  Clinical Narrative: PT/OT recc HHC. Paitent agreeable to HHC;& prefers shower stool dme @ d/c-[facility] aware to deliver to rm prior d/c.Await [place] agency to accept.                  Expected Discharge Plan: [place] Barriers to Discharge: Continued Medical Work up   Patient Goals and CMS Choice Patient states their goals for this hospitalization and ongoing recovery are:: go home CMS Medicare.gov Compare Post Acute Care list provided to:: Patient Choice offered to / list presented to : Patient  Expected Discharge Plan and Services Expected Discharge Plan: [place] Choice: Durable Medical Equipment, Home Health Living arrangements for the past 2 months: Single Family Home Expected Discharge Date:  (unknown)               DME Arranged: Shower stool DME Agency: [facility]                  Prior Living Arrangements/Services Living arrangements for the past 2 months: Single Family Home Lives with:: Spouse Patient language and need for interpreter reviewed:: Yes Do you feel safe going back to the place where you live?: Yes      Need for Family Participation in Patient Care: No (Comment) Care giver support system in place?: Yes (comment)   Criminal Activity/Legal Involvement Pertinent to Current Situation/Hospitalization: No - Comment as needed  Activities of Daily Living Home Assistive Devices/Equipment: Eyeglasses ADL Screening (condition at time of admission) Patient's cognitive ability adequate to safely complete daily activities?: Yes Is the patient deaf or have difficulty hearing?: No Does the patient have difficulty seeing, even when wearing glasses/contacts?: No Does the  patient have difficulty concentrating, remembering, or making decisions?: No Patient able to express need for assistance with ADLs?: Yes Does the patient have difficulty dressing or bathing?: No Independently performs ADLs?: Yes (appropriate for developmental age) Does the patient have difficulty walking or climbing stairs?: Yes (secondary to dizziness) Weakness of Legs: Both Weakness of Arms/Hands: None  Permission Sought/Granted Permission sought to share information with : Case Manager Permission granted to share information with : Yes, Verbal Permission Granted  Share Information with NAME: Case manager           Emotional Assessment Appearance:: Appears stated age Attitude/Demeanor/Rapport: Gracious Affect (typically observed): Accepting Orientation: : Oriented to Self, Oriented to Place, Oriented to  Time, Oriented to Situation Alcohol / Substance Use: Not Applicable Psych Involvement: No (comment)  Admission diagnosis:  Volume depletion, gastrointestinal loss [E86.9] Sepsis ([place]) [A41.9] Patient Active Problem List   Diagnosis Date Noted  . Volume depletion, gastrointestinal loss   . Hypomagnesemia   . Constipation   . Volume depletion 05/16/2020  . Sepsis ([place]) 05/16/2020  . Nausea and vomiting 04/28/2020  . Failure to thrive in adult 04/27/2020  . Physical exam 01/01/2020  . Abdominal pain with vomiting 04/25/2019  . Hyponatremia 04/25/2019  . Hypokalemia 04/25/2019  . Severe protein-calorie malnutrition ([place]) 04/25/2019  . GERD (gastroesophageal reflux disease) 04/07/2019  . Acute lower UTI 04/07/2019  . Normocytic anemia 04/07/2019  . 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 Iron deficiency anemia 01/22/2018  . Vitamin B deficiency 01/22/2018  .  Vitamin D deficiency 01/22/2018  . PCP NOTES >>>>>>>>>>>>>>>>>>>>>>>>>>>>> 06/28/2017  . Underweight 06/27/2017  . Cyst of ovary 10/05/2016  . Orthostatic hypotension 07/27/2013  . Hepatic adenoma  07/27/2013   PCP:  [name], MD Pharmacy:   [id number] [place], [place] - [address] [place] [address] Phone: [phone] Fax: [phone]     Social Determinants of Health (SDOH) Interventions    Readmission Risk Interventions No flowsheet data found.

## 2020-05-22 NOTE — Evaluation (Signed)
Occupational Therapy Evaluation Patient Details Name: Stacy [name] MRN: [id number] DOB: [birth year] Today's Date: 05/22/2020    History of Present Illness 39 yo female admitted with volume depletion, hypotension. Hx of anxiety, GERD, FTT, chronic intestinal pseudo obstruction.   Clinical Impression   Pt admitted with the above diagnoses and presents with below problem list. Pt will benefit from continued acute OT to address the below listed deficits and maximize independence with basic ADLs prior to d/c home. At baseline, pt is independent with ADLs. Pt presents with generalized weakness, decreased activity tolerance, and symptomatic orthostatic hypotension impacting ADLs. Pt is currently min guard assist with LB ADLs and functional transfers/mobility. Limited to short distances for walking at this time. Spouse present throughout session.       Follow Up Recommendations  Home health OT;Supervision/Assistance - 24 hour    Equipment Recommendations  3 in 1 bedside commode    Recommendations for Other Services       Precautions / Restrictions Precautions Precaution Comments: orthostatic hypotension Restrictions Weight Bearing Restrictions: No      Mobility Bed Mobility Overal bed mobility: Modified Independent                Transfers Overall transfer level: Needs assistance Equipment used: None Transfers: Sit to/from Stand Sit to Stand: Supervision         General transfer comment: pt guarded with movements 2/2 symptomatic orthostatic hypotension.     Balance Overall balance assessment: Needs assistance   Sitting balance-Leahy Scale: Fair Sitting balance - Comments: single to bilateral extremity support in static sitting     Standing balance-Leahy Scale: Fair                             ADL either performed or assessed with clinical judgement   ADL Overall ADL's : Needs assistance/impaired Eating/Feeding: Set up;Sitting   Grooming: Set  up;Sitting   Upper Body Bathing: Set up;Sitting   Lower Body Bathing: Min guard;Sit to/from stand   Upper Body Dressing : Set up;Sitting   Lower Body Dressing: Min guard;Sit to/from stand   Toilet Transfer: Min guard;Ambulation   Toileting- Clothing Manipulation and Hygiene: Min guard;Sit to/from stand   Tub/ Shower Transfer: Min guard;Ambulation;3 in 1;Walk-in shower   Functional mobility during ADLs: Min guard General ADL Comments: Close min guard to walk distance to/from bathroom. Discussed setting up environment to include places for seated rest breaks at home as needed. Also discussed recommendation of 3n1 as shower seat for now.      Vision         Perception     Praxis      Pertinent Vitals/Pain Pain Assessment: No/denies pain     Hand Dominance     Extremity/Trunk Assessment Upper Extremity Assessment Upper Extremity Assessment: Generalized weakness;Overall [facility] for tasks assessed   Lower Extremity Assessment Lower Extremity Assessment: Defer to PT evaluation   Cervical / Trunk Assessment Cervical / Trunk Assessment: Normal   Communication Communication Communication: No difficulties   Cognition Arousal/Alertness: Awake/alert Behavior During Therapy: WFL for tasks assessed/performed Overall Cognitive Status: Within Functional Limits for tasks assessed                                     General Comments  Spouse present and included in ADL education    Exercises     Shoulder Instructions  Home Living Family/patient expects to be discharged to:: Private residence Living Arrangements: Spouse/significant other Available Help at Discharge: Family Type of Home: House Home Access: Stairs to enter Technical brewer of Steps: 2   Home Layout: Able to live on main level with bedroom/bathroom     Bathroom Shower/Tub: Walk-in shower;Tub/shower unit         Home Equipment: Walker - 4 wheels          Prior  Functioning/Environment Level of Independence: Independent                 OT Problem List: Decreased strength;Decreased activity tolerance;Impaired balance (sitting and/or standing);Decreased knowledge of use of DME or AE;Decreased knowledge of precautions;Cardiopulmonary status limiting activity      OT Treatment/Interventions: Self-care/ADL training;Therapeutic exercise;Energy conservation;DME and/or AE instruction;Therapeutic activities;Patient/family education;Balance training    OT Goals(Current goals can be found in the care plan section) Acute Rehab OT Goals Patient Stated Goal: to get better. "i need help" OT Goal Formulation: With patient/family Time For Goal Achievement: 06/05/20 Potential to Achieve Goals: Good ADL Goals Pt Will Perform Grooming: with supervision;standing Pt Will Perform Lower Body Bathing: sit to/from stand;with modified independence Pt Will Perform Lower Body Dressing: with modified independence;sit to/from stand Pt Will Perform Tub/Shower Transfer: with modified independence;ambulating;3 in 1 Pt/caregiver will Perform Home Exercise Program: Increased strength;Both right and left upper extremity;With written HEP provided;Independently  OT Frequency: Min 2X/week   Barriers to D/C:            Co-evaluation              AM-PAC OT "6 Clicks" Daily Activity     Outcome Measure Help from another person eating meals?: None Help from another person taking care of personal grooming?: None Help from another person toileting, which includes using toliet, bedpan, or urinal?: A Little Help from another person bathing (including washing, rinsing, drying)?: A Little Help from another person to put on and taking off regular upper body clothing?: None Help from another person to put on and taking off regular lower body clothing?: A Little 6 Click Score: 21   End of Session    Activity Tolerance: Other (comment) (symptomatic orthostatic  hypotension) Patient left: in bed;with call bell/phone within reach;with family/visitor present  OT Visit Diagnosis: Unsteadiness on feet (R26.81);Muscle weakness (generalized) (M62.81);Other (comment) (symptomatic orthostatic hypotension)                Time: 5956-3875 OT Time Calculation (min): 14 min Charges:  OT General Charges $OT Visit: 1 Visit OT Evaluation $OT Eval Low Complexity: [place], OT Acute Rehabilitation Services Pager: [phone] Office: [phone]   Hortencia Pilar 05/22/2020, 12:54 PM

## 2020-05-22 NOTE — Progress Notes (Signed)
Physical Therapy Treatment Patient Details Name: Stacy [name] MRN: [id number] DOB: 1981/02/13 Today's Date: 05/22/2020    History of Present Illness 39 yo female admitted with volume depletion, hypotension. Hx of anxiety, GERD, FTT, chronic intestinal pseudo obstruction.    PT Comments    Patient seen for progression of mobility with acute PT. She remains limited by orthostatic hypotension and denied symptoms of dizziness/lightheadedness this session however was noted to be mildly diaphoretic. Pt dropped to low of 68/46 after second attempt at sit<>stand and pt returned to supine in bed. RN notified. Acute PT will continue to progress pt as able.  VITALS Position                   BP             HR Semi-Supine           87/56        113      (sitting in bed) Standing                  71/45        131        (pt sat EOB for ~2 minutes prior to 1st stand) Standing                  68/46        128        (pt sat EOB for ~2-3 minutes prior to 2nd stand)    Follow Up Recommendations  Home health PT;Supervision for mobility/OOB     Equipment Recommendations  None recommended by PT    Recommendations for Other Services OT consult     Precautions / Restrictions Precautions Precaution Comments: orthostatic hypotension Restrictions Weight Bearing Restrictions: No    Mobility  Bed Mobility Overal bed mobility: Modified Independent             General bed mobility comments: pt denied symptoms with supine to sit.  Transfers Overall transfer level: Needs assistance Equipment used: None Transfers: Sit to/from Stand Sit to Stand: Supervision         General transfer comment: pt with slow guarded movement. denies symptoms with sist<>stand. Pt with orthostatic drop on first stand. returned to sit EOB for ~2 minutes, 2nd attempt performed and BP dropped further. Pt returned to supine. (see vitals)  Ambulation/Gait                 Stairs              Wheelchair Mobility    Modified Rankin (Stroke Patients Only)       Balance Overall balance assessment: Needs assistance   Sitting balance-Leahy Scale: Fair Sitting balance - Comments: single to bilateral extremity support in static sitting     Standing balance-Leahy Scale: Fair Standing balance comment: no external support, pt with static standing only due to orthostasis                            Cognition Arousal/Alertness: Awake/alert Behavior During Therapy: WFL for tasks assessed/performed Overall Cognitive Status: Within Functional Limits for tasks assessed                                        Exercises General Exercises - Lower Extremity Ankle Circles/Pumps: AROM;Both;20 reps;Seated (seated EOB to facilitate circulation)  General Comments General comments (skin integrity, edema, etc.): pt's spose present for session. pt denied symptoms throughout session but noted to be mildly diaphoretic.      Pertinent Vitals/Pain Pain Assessment: No/denies pain    Home Living                      Prior Function            PT Goals (current goals can now be found in the care plan section) Acute Rehab PT Goals Patient Stated Goal: to get better. "i need help" PT Goal Formulation: With patient/family Time For Goal Achievement: 06/04/20 Potential to Achieve Goals: Good Progress towards PT goals: Progressing toward goals (limited by orthostatic hypotension)    Frequency    Min 3X/week      PT Plan Current plan remains appropriate    Co-evaluation              AM-PAC PT "6 Clicks" Mobility   Outcome Measure  Help needed turning from your back to your side while in a flat bed without using bedrails?: None Help needed moving from lying on your back to sitting on the side of a flat bed without using bedrails?: None Help needed moving to and from a bed to a chair (including a wheelchair)?: A Little Help needed  standing up from a chair using your arms (e.g., wheelchair or bedside chair)?: A Little Help needed to walk in hospital room?: A Little Help needed climbing 3-5 steps with a railing? : A Little 6 Click Score: 20    End of Session Equipment Utilized During Treatment: Gait belt Activity Tolerance: Patient tolerated treatment well;Treatment limited secondary to medical complications (Comment) (limited by orthostatic hypotension.) Patient left: in bed;with call bell/phone within reach;with family/visitor present Nurse Communication: Mobility status PT Visit Diagnosis: Muscle weakness (generalized) (M62.81);Difficulty in walking, not elsewhere classified (R26.2)     Time: 7972-8206 PT Time Calculation (min) (ACUTE ONLY): 22 min  Charges:  $Therapeutic Activity: 8-22 mins                     Verner Mould, DPT Acute Rehabilitation Services  Office [phone] Pager [phone]  05/22/2020 6:59 PM

## 2020-05-23 DIAGNOSIS — E869 Volume depletion, unspecified: Secondary | ICD-10-CM | POA: Diagnosis not present

## 2020-05-23 LAB — CBC
HCT: 31.6 % — ABNORMAL LOW (ref 36.0–46.0)
Hemoglobin: 10.1 g/dL — ABNORMAL LOW (ref 12.0–15.0)
MCH: 29.1 pg (ref 26.0–34.0)
MCHC: 32 g/dL (ref 30.0–36.0)
MCV: 91.1 fL (ref 80.0–100.0)
Platelets: 313 10*3/uL (ref 150–400)
RBC: 3.47 MIL/uL — ABNORMAL LOW (ref 3.87–5.11)
RDW: 17 % — ABNORMAL HIGH (ref 11.5–15.5)
WBC: 10.7 10*3/uL — ABNORMAL HIGH (ref 4.0–10.5)
nRBC: 0 % (ref 0.0–0.2)

## 2020-05-23 MED ORDER — FLUDROCORTISONE ACETATE 0.1 MG PO TABS
0.1000 mg | ORAL_TABLET | Freq: Every day | ORAL | Status: DC
  Administered 2020-05-23 – 2020-05-28 (×6): 0.1 mg via ORAL
  Filled 2020-05-23 (×6): qty 1

## 2020-05-23 MED ORDER — POLYETHYLENE GLYCOL 3350 17 G PO PACK
17.0000 g | PACK | Freq: Every day | ORAL | Status: DC | PRN
  Administered 2020-05-23: 17 g via ORAL
  Filled 2020-05-23: qty 1

## 2020-05-23 MED ORDER — PROSOURCE PLUS PO LIQD
30.0000 mL | Freq: Two times a day (BID) | ORAL | Status: DC
  Administered 2020-05-23 – 2020-05-28 (×9): 30 mL via ORAL
  Filled 2020-05-23 (×10): qty 30

## 2020-05-23 NOTE — Progress Notes (Signed)
Nutrition Follow-up  DOCUMENTATION CODES:   Underweight  INTERVENTION:  - will d/c Boost Breeze and [employer] 1.4 po. - continue Prosource Plus and will increase from once/day to BID.  NUTRITION DIAGNOSIS:   Inadequate oral intake related to acute illness, nausea, vomiting as evidenced by per patient/family report. -improving  GOAL:   Patient will meet greater than or equal to 90% of their needs -minimally met on average  MONITOR:   PO intake, Supplement acceptance, Labs, Weight trends  ASSESSMENT:   39 year-old female with medical history of anxiety, chronic intestinal pseudo-obstruction, GERD, and FTT. She had struggled with losing weight until 7 years ago. She now experiences intermittent N/V and frequently feels she is unable to eat. In the ED she reported PTA she was feeling dizzy with standing, weakness, and generally feeling unwell. She is vaccinated against COVID-19.  She has mainly been eating 75-100% of the past 4 days which is a big improvement from previously. She has been refusing Boost Breeze and accepting [employer] ~25% of the time offered. She has been accepting Prosource Plus 100% of the time offered.   Weight on 10/5 was exactly the same as on 10/1; not weighed since that time.   Per notes: - volume depletion is thought to be 2/2 ongoing GI losses with cyclic vomiting syndrome and diarrhea - UTI - iron deficiency anemia  - severe protein calorie malnutrition   Labs reviewed; creatinine: 0.37 mg/dl, Ca: 8.5 mg/dl. Medications reviewed; 1 tablet multivitamin with minerals/day, 40 mg oral protonix BID.    Diet Order:   Diet Order            Diet regular Room service appropriate? Yes; Fluid consistency: Thin  Diet effective now                 EDUCATION NEEDS:   No education needs have been identified at this time  Skin:  Skin Assessment: Reviewed RN Assessment  Last BM:  10/7  Height:   Ht Readings from Last 1 Encounters:  05/20/20 5' 3"   (1.6 m)    Weight:   Wt Readings from Last 1 Encounters:  05/20/20 42.6 kg    Estimated Nutritional Needs:  Kcal:  5009-3818 kcal Protein:  85-100 grams Fluid:  >/= 2 L/day     Jarome Matin, MS, RD, LDN, CNSC Inpatient Clinical Dietitian RD pager # available in [facility]  After hours/weekend pager # available in [facility]

## 2020-05-23 NOTE — Progress Notes (Addendum)
PROGRESS NOTE    Stacy [name]  VXB:[id number] DOB: 1980/12/17 DOA: 05/16/2020 PCP: [name], MD     Brief Narrative:  Stacy [name] is a 39 yr old female. She states that she used to struggle with weight loss. However, about 7 years ago she began losing weight without trying. She had intermittent nausea and vomiting and frequently not being able to eat. She continues to lose weight. She says it is like a nightmare. Recently the patient was discharged from this institution after a stay for intestinal pseudo-obstruction.   The patient carries a past medical history significant for anxiety, chronic intestinal pseudo-obstruction, GERD, and FTT.  In the ED she was found to be hypotensive with tachycardia. Temperature, respiratory rate, and O2 saturations were normal. Creatinine is 0.45. Fe 23, TIBC 85, Ferritin is 147, albumin is 1.5, LFT's are normal. Lactic Acid is 2.0 upon presentation, but came down to 1.6 with hydration. WBC is 14.1 (increased bands and neutrophils), and Hemoglobin is 8.7. This is down from 9.2 on 05/01/2020. She is negative for COVID.   She was admitted for hypotension and volume depletion.   New events last 24 hours / Subjective: Positive orthostatic vital sign this morning blood pressure 99/71-->67/50.   Assessment & Plan:   Principal Problem:   Volume depletion Active Problems:   Underweight   Iron deficiency anemia   Chronic intestinal pseudo-obstruction   GERD (gastroesophageal reflux disease)   Abdominal pain with vomiting   Hypokalemia   Severe protein-calorie malnutrition (HCC)   Failure to thrive in adult   Nausea and vomiting   Sepsis (HCC)   Hypomagnesemia   Constipation   Volume depletion, gastrointestinal loss   Orthostatic hypotension secondary to volume depletion, nausea, vomiting, diarrhea and suspected component of adrenal insufficiency  -Unable to complete cosyntropin stim test due to starting on IV Solu-Cortef on  admission -Volume depletion thought to be secondary to ongoing GI loss -Continue midodrine, increased dose 10 mg 3 times daily. Add fludrocortisone today.  Repeat orthostatic vital signs in the morning  Proteus mirabilis, E. coli UTI -Augmentin  Cyclical vomiting syndrome -Appreciate GI, follow-up with them outpatient  Iron deficiency anemia -Transfused 2u pRBC 10/4 -Status post EGD 10/5 which showed normal esophagus, normal duodenum, erythematous mucosa in the stomach -IV Feraheme given -PPI -Continue to monitor CBC   Severe protein calorie malnutrition -Appreciate dietitian    DVT prophylaxis:  Place and maintain sequential compression device Start: 05/19/20 0943 Place TED hose Start: 05/17/20 1315  Code Status: Full code Family Communication: At bedside Disposition Plan:  Status is: Inpatient  Remains inpatient appropriate because:Hemodynamically unstable   Dispo: The patient is from: Home              Anticipated d/c is to: Home              Anticipated d/c date is: 1 day              Patient currently is not medically stable to d/c.  Orthostatic positive this morning.  Continue Midodrine at an increased dose, start fludrocortisone, and repeat orthostatic vital signs in the morning.   Antimicrobials:  Anti-infectives (From admission, onward)   Start     Dose/Rate Route Frequency Ordered Stop   05/19/20 1315  amoxicillin-clavulanate (AUGMENTIN) 875-125 MG per tablet 1 tablet        1 tablet Oral Every 12 hours 05/19/20 1219 05/24/20 0959   05/16/20 2000  piperacillin-tazobactam (ZOSYN) IVPB 3.375 g  Status:  Discontinued        3.375 g 12.5 mL/hr over 240 Minutes Intravenous Every 8 hours 05/16/20 1851 05/19/20 1219   05/16/20 1800  piperacillin-tazobactam (ZOSYN) IVPB 3.375 g  Status:  Discontinued        3.375 g 12.5 mL/hr over 240 Minutes Intravenous Every 8 hours 05/16/20 1741 05/16/20 1851       Objective: Vitals:   05/22/20 2049 05/23/20 0500 05/23/20  0821 05/23/20 1125  BP: 121/89 99/71 103/71 103/71  Pulse: 90 100 96   Resp: 20 20    Temp: 98.6 F (37 C) 98.2 F (36.8 C)    TempSrc: Oral Oral    SpO2: 100% 98%    Weight:      Height:        Intake/Output Summary (Last 24 hours) at 05/23/2020 1147 Last data filed at 05/23/2020 0500 Gross per 24 hour  Intake 810 ml  Output 600 ml  Net 210 ml   Filed Weights   05/16/20 0841 05/20/20 0849  Weight: 42.6 kg 42.6 kg    Examination: General exam: Appears calm and comfortable, thin, nontoxic-appearing Respiratory system: Respiratory effort normal without conversational dyspnea Central nervous system: Alert and oriented. Speech clear  Extremities: Symmetric in appearance bilaterally  Psychiatry: Judgement and insight appear stable. Mood & affect appropriate.   Data Reviewed: I have personally reviewed following labs and imaging studies  CBC: Recent Labs  Lab 05/17/20 1418 05/17/20 1418 05/18/20 0556 05/18/20 0556 05/19/20 0553 05/19/20 0553 05/19/20 2053 05/20/20 0502 05/21/20 0408 05/22/20 0417 05/23/20 0500  WBC 8.7   < > 9.5   < > 11.5*  --   --  10.7* 15.1* 15.5* 10.7*  NEUTROABS 6.4  --  6.9  --   --   --   --   --   --   --   --   HGB 8.1*   < > 8.2*   < > 6.3*   < > 10.1* 10.7* 11.3* 10.6* 10.1*  HCT 25.1*   < > 25.0*   < > 19.4*   < > 30.3* 32.3* 34.8* 35.4* 31.6*  MCV 91.6   < > 90.9   < > 91.5  --   --  88.7 88.8 97.8 91.1  PLT 362   < > 326   < > 196  --   --  255 284 254 313   < > = values in this interval not displayed.   Basic Metabolic Panel: Recent Labs  Lab 05/18/20 0556 05/19/20 0553 05/20/20 0502 05/21/20 0408 05/22/20 0417  NA 134* 135 136 137 136  K 3.2* 3.1* 4.7 3.8 3.9  CL 104 105 101 99 99  CO2 21* 25 29 31 30   GLUCOSE 209* 80 87 99 89  BUN 12 7 7 8 11   CREATININE 0.33* 0.32* 0.34* 0.38* 0.37*  CALCIUM 7.6* 8.3* 8.2* 8.2* 8.5*  MG 2.0 1.7 2.0 1.8 1.9  PHOS 3.2 2.1* 3.5 3.4  --    GFR: Estimated Creatinine Clearance: 62.9  mL/min (A) (by C-G formula based on SCr of 0.37 mg/dL (L)). Liver Function Tests: Recent Labs  Lab 05/17/20 0810 05/18/20 0556 05/19/20 0553 05/20/20 0502 05/21/20 0408  AST 11* 10*  --   --   --   ALT 11 9  --   --   --   ALKPHOS 55 57  --   --   --   BILITOT 0.4 0.7  --   --   --  PROT 3.6* 3.6*  --   --   --   ALBUMIN 1.4* 1.4* 2.7* 2.7* 2.5*   No results for input(s): LIPASE, AMYLASE in the last 168 hours. No results for input(s): AMMONIA in the last 168 hours. Coagulation Profile: No results for input(s): INR, PROTIME in the last 168 hours. Cardiac Enzymes: No results for input(s): CKTOTAL, CKMB, CKMBINDEX, TROPONINI in the last 168 hours. BNP (last 3 results) No results for input(s): PROBNP in the last 8760 hours. HbA1C: No results for input(s): HGBA1C in the last 72 hours. CBG: No results for input(s): GLUCAP in the last 168 hours. Lipid Profile: No results for input(s): CHOL, HDL, LDLCALC, TRIG, CHOLHDL, LDLDIRECT in the last 72 hours. Thyroid Function Tests: No results for input(s): TSH, T4TOTAL, FREET4, T3FREE, THYROIDAB in the last 72 hours. Anemia Panel: No results for input(s): VITAMINB12, FOLATE, FERRITIN, TIBC, IRON, RETICCTPCT in the last 72 hours. Sepsis Labs: Recent Labs  Lab 05/16/20 1200  LATICACIDVEN 1.6    Recent Results (from the past 240 hour(s))  Respiratory Panel by RT PCR (Flu A&B, Covid) - Nasopharyngeal Swab     Status: None   Collection Time: 05/16/20 10:54 AM   Specimen: Nasopharyngeal Swab  Result Value Ref Range Status   SARS Coronavirus 2 by RT PCR NEGATIVE NEGATIVE Final    Comment: (NOTE) SARS-CoV-2 target nucleic acids are NOT DETECTED.  The SARS-CoV-2 RNA is generally detectable in upper respiratoy specimens during the acute phase of infection. The lowest concentration of SARS-CoV-2 viral copies this assay can detect is 131 copies/mL. A negative result does not preclude SARS-Cov-2 infection and should not be used as the sole  basis for treatment or other patient management decisions. A negative result may occur with  improper specimen collection/handling, submission of specimen other than nasopharyngeal swab, presence of viral mutation(s) within the areas targeted by this assay, and inadequate number of viral copies (<131 copies/mL). A negative result must be combined with clinical observations, patient history, and epidemiological information. The expected result is Negative.  Fact Sheet for Patients:  PinkCheek.be  Fact Sheet for Healthcare Providers:  GravelBags.it  This test is no t yet approved or cleared by the [place] FDA and  has been authorized for detection and/or diagnosis of SARS-CoV-2 by FDA under an Emergency Use Authorization (EUA). This EUA will remain  in effect (meaning this test can be used) for the duration of the COVID-19 declaration under Section 564(b)(1) of the Act, 21 U.S.C. section 360bbb-3(b)(1), unless the authorization is terminated or revoked sooner.     Influenza A by PCR NEGATIVE NEGATIVE Final   Influenza B by PCR NEGATIVE NEGATIVE Final    Comment: (NOTE) The Xpert Xpress SARS-CoV-2/FLU/RSV assay is intended as an aid in  the diagnosis of influenza from Nasopharyngeal swab specimens and  should not be used as a sole basis for treatment. Nasal washings and  aspirates are unacceptable for Xpert Xpress SARS-CoV-2/FLU/RSV  testing.  Fact Sheet for Patients: PinkCheek.be  Fact Sheet for Healthcare Providers: GravelBags.it  This test is not yet approved or cleared by the [place] FDA and  has been authorized for detection and/or diagnosis of SARS-CoV-2 by  FDA under an Emergency Use Authorization (EUA). This EUA will remain  in effect (meaning this test can be used) for the duration of the  Covid-19 declaration under Section 564(b)(1) of the  Act, 21  U.S.C. section 360bbb-3(b)(1), unless the authorization is  terminated or revoked. Performed at [facility], [address] [place], [place] [address]   Culture, blood (single)     Status: None   Collection Time: 05/16/20 11:04 AM   Specimen: BLOOD  Result Value Ref Range Status   Specimen Description   Final    BLOOD LEFT ANTECUBITAL Performed at [facility] [address] [place], [place] [address]    Special Requests   Final    BOTTLES DRAWN AEROBIC AND ANAEROBIC Blood Culture adequate volume Performed at [facility] [address], [place], [place] [address]    Culture   Final    NO GROWTH 5 DAYS Performed at [facility], [place] [address], [place], [place] [address]    Report Status 05/21/2020 FINAL  Final  Urine culture     Status: Abnormal   Collection Time: 05/16/20  5:14 PM   Specimen: Urine, Random  Result Value Ref Range Status   Specimen Description   Final    URINE, RANDOM Performed at [facility] [address] [place], [place] [address]    Special Requests   Final    NONE Performed at [facility], [place] [address] [place], [place] [address]    Culture (A)  Final    >=100,000 COLONIES/mL PROTEUS MIRABILIS >=100,000 COLONIES/mL ESCHERICHIA COLI    Report Status 05/19/2020 FINAL  Final   Organism ID, Bacteria PROTEUS MIRABILIS (A)  Final   Organism ID, Bacteria ESCHERICHIA COLI (A)  Final      Susceptibility   Escherichia coli - MIC*    AMPICILLIN <=2 SENSITIVE Sensitive     CEFAZOLIN <=4 SENSITIVE Sensitive     CEFTRIAXONE <=0.25 SENSITIVE Sensitive     CIPROFLOXACIN >=4 RESISTANT Resistant     GENTAMICIN <=1 SENSITIVE Sensitive     IMIPENEM <=0.25 SENSITIVE Sensitive     NITROFURANTOIN <=16 SENSITIVE Sensitive     TRIMETH/SULFA <=20 SENSITIVE Sensitive     AMPICILLIN/SULBACTAM <=2 SENSITIVE Sensitive     PIP/TAZO <=4 SENSITIVE  Sensitive     * >=100,000 COLONIES/mL ESCHERICHIA COLI   Proteus mirabilis - MIC*    AMPICILLIN <=2 SENSITIVE Sensitive     CEFAZOLIN <=4 SENSITIVE Sensitive     CEFTRIAXONE <=0.25 SENSITIVE Sensitive     CIPROFLOXACIN <=0.25 SENSITIVE Sensitive     GENTAMICIN <=1 SENSITIVE Sensitive     IMIPENEM 0.5 SENSITIVE Sensitive     NITROFURANTOIN RESISTANT Resistant     TRIMETH/SULFA <=20 SENSITIVE Sensitive     AMPICILLIN/SULBACTAM <=2 SENSITIVE Sensitive     PIP/TAZO <=4 SENSITIVE Sensitive     * >=100,000 COLONIES/mL PROTEUS MIRABILIS  Gastrointestinal Panel by PCR , Stool     Status: None   Collection Time: 05/16/20  5:14 PM   Specimen: Stool  Result Value Ref Range Status   Campylobacter species NOT DETECTED NOT DETECTED Final   Plesimonas shigelloides NOT DETECTED NOT DETECTED Final   Salmonella species NOT DETECTED NOT DETECTED Final   Yersinia enterocolitica NOT DETECTED NOT DETECTED Final   Vibrio species NOT DETECTED NOT DETECTED Final   Vibrio cholerae NOT DETECTED NOT DETECTED Final   Enteroaggregative E coli (EAEC) NOT DETECTED NOT DETECTED Final   Enteropathogenic E coli (EPEC) NOT DETECTED NOT DETECTED Final   Enterotoxigenic E coli (ETEC) NOT DETECTED NOT DETECTED Final   Shiga like toxin producing E coli (STEC) NOT DETECTED NOT DETECTED Final   Shigella/Enteroinvasive E coli (EIEC) NOT DETECTED NOT DETECTED Final   Cryptosporidium NOT DETECTED NOT DETECTED Final  Cyclospora cayetanensis NOT DETECTED NOT DETECTED Final   Entamoeba histolytica NOT DETECTED NOT DETECTED Final   Giardia lamblia NOT DETECTED NOT DETECTED Final   Adenovirus F40/41 NOT DETECTED NOT DETECTED Final   Astrovirus NOT DETECTED NOT DETECTED Final   Norovirus GI/GII NOT DETECTED NOT DETECTED Final   Rotavirus A NOT DETECTED NOT DETECTED Final   Sapovirus (I, II, IV, and V) NOT DETECTED NOT DETECTED Final    Comment: Performed at [facility], [address], [place], [place] [address]       Radiology Studies: No results found.    Scheduled Meds: . (feeding supplement) PROSource Plus  30 mL Oral Daily  . amoxicillin-clavulanate  1 tablet Oral Q12H  . feeding supplement  1 Container Oral BID BM  . feeding supplement (KATE FARMS STANDARD 1.4)  325 mL Oral Daily  . fludrocortisone  0.1 mg Oral Daily  . midodrine  10 mg Oral TID WC  . multivitamin with minerals  1 tablet Oral Daily  . pantoprazole  40 mg Oral BID AC   Continuous Infusions:   LOS: 7 days      Time spent: 25 minutes   Dessa Phi, DO [facility] 05/23/2020, 11:47 AM   Available via Epic secure chat 7am-7pm After these hours, please refer to coverage provider listed on [email or web address]

## 2020-05-23 NOTE — Progress Notes (Signed)
Physical Therapy Treatment Patient Details Name: Stacy [name] MRN: [id number] DOB: 1981-04-07 Today's Date: 05/23/2020    History of Present Illness 39 yo female admitted with volume depletion, hypotension. Hx of anxiety, GERD, FTT, chronic intestinal pseudo obstruction.    PT Comments    Patient progressing slowly due to hypotension. She denied symptoms throughout session and is completing transfers at supervision level. Patient ambulated short distance in room and reported LE fatigue after ~60', min guard for safety due to mild unsteadiness. BP noted to be 80/60 and HR 113 bpm after gait. Pt transferred to recliner to increase time OOB and BP with slight drop to 73/55 with HR 111 bpm. Pt denies symptoms. RN notified and pt agreeable to continue calling for assist. Acute PT will progress pt as able.   Follow Up Recommendations  Home health PT;Supervision for mobility/OOB     Equipment Recommendations  None recommended by PT    Recommendations for Other Services OT consult     Precautions / Restrictions Precautions Precaution Comments: orthostatic hypotension Restrictions Weight Bearing Restrictions: No    Mobility  Bed Mobility Overal bed mobility: Modified Independent             General bed mobility comments: pt denied symptoms with supine to sit.  Transfers Overall transfer level: Needs assistance Equipment used: None Transfers: Sit to/from Stand Sit to Stand: Supervision         General transfer comment: pt slow and guarded with stand pivot from Bed<>BSC. sueprvision for safety, pt denied symptoms throughout. Stand step transfer to recliner after gait and BP remains low, pt without symptoms, BP 73/55 with HR 111 at EOS sitting in recliner.   Ambulation/Gait Ambulation/Gait assistance: Min guard Gait Distance (Feet): 60 Feet (3 loops in room) Assistive device: None Gait Pattern/deviations: Step-through pattern;Decreased stride length Gait velocity:  decr   General Gait Details: pt with guarded gait, reaching Lt UE to foot board of bed for intermittent support. No overt LOB noted and pt denied symptoms throughout. HR elevated to 128 bpm max and decreaed to 100-113 bpm sitting EOB after gait. BP after gait 80/60 and HR 113.   Stairs             Wheelchair Mobility    Modified Rankin (Stroke Patients Only)       Balance Overall balance assessment: Needs assistance   Sitting balance-Leahy Scale: Fair Sitting balance - Comments: single to bilateral extremity support in static sitting     Standing balance-Leahy Scale: Fair Standing balance comment: no external support, pt with static standing only due to orthostasis                            Cognition Arousal/Alertness: Awake/alert Behavior During Therapy: WFL for tasks assessed/performed Overall Cognitive Status: Within Functional Limits for tasks assessed                                        Exercises      General Comments        Pertinent Vitals/Pain Pain Assessment: No/denies pain    Home Living                      Prior Function            PT Goals (current goals can now be found in the  care plan section) Acute Rehab PT Goals Patient Stated Goal: to get better. "i need help" PT Goal Formulation: With patient/family Time For Goal Achievement: 06/04/20 Potential to Achieve Goals: Good Progress towards PT goals: Progressing toward goals (limited by hypotension)    Frequency    Min 3X/week      PT Plan Current plan remains appropriate    Co-evaluation              AM-PAC PT "6 Clicks" Mobility   Outcome Measure  Help needed turning from your back to your side while in a flat bed without using bedrails?: None Help needed moving from lying on your back to sitting on the side of a flat bed without using bedrails?: None Help needed moving to and from a bed to a chair (including a wheelchair)?: A  Little Help needed standing up from a chair using your arms (e.g., wheelchair or bedside chair)?: A Little Help needed to walk in hospital room?: A Little Help needed climbing 3-5 steps with a railing? : A Little 6 Click Score: 20    End of Session Equipment Utilized During Treatment: Gait belt Activity Tolerance: Patient tolerated treatment well;Treatment limited secondary to medical complications (Comment) (continues to be hypotensive) Patient left: in bed;with call bell/phone within reach;with family/visitor present Nurse Communication: Mobility status PT Visit Diagnosis: Muscle weakness (generalized) (M62.81);Difficulty in walking, not elsewhere classified (R26.2)     Time: 1040-1101 PT Time Calculation (min) (ACUTE ONLY): 21 min  Charges:  $Gait Training: 8-22 mins                     Verner Mould, DPT Acute Rehabilitation Services  Office [phone] Pager [phone]  05/23/2020 11:39 AM

## 2020-05-23 NOTE — Progress Notes (Signed)
Patient requesting to restart miralax. Notified Dr. Maylene Roes.

## 2020-05-23 NOTE — TOC Progression Note (Signed)
Transition of Care [facility]) - Progression Note    Patient Details  Name: Stacy [name] MRN: [id number] Date of Birth: 11/09/1980  Transition of [facility]) CM/SW Contact  Ameyah Bangura, [name], RN Phone Number: 05/23/2020, 11:13 AM  Clinical Narrative: [facility] HHPT/OT;[facility] rw,shower stool. No further CM needs.      Expected Discharge Plan: [place] Barriers to Discharge: Continued Medical Work up  Expected Discharge Plan and Services Expected Discharge Plan: [place] Choice: Durable Medical Equipment, Home Health Living arrangements for the past 2 months: Single Family Home Expected Discharge Date:  (unknown)               DME Arranged: 3-N-1 DME Agency: [facility] Date DME Agency Contacted: 05/22/20 Time DME Agency Contacted: [id number] Representative spoke with at DME Agency: Myriam Jacobson Sebastian: PT, OT [place] Agency: [place] Date [place]: 05/23/20 Time Wink: 1112 Representative spoke with at [place]: [place] ([place]) Interventions    Readmission Risk Interventions No flowsheet data found.

## 2020-05-23 NOTE — Plan of Care (Signed)

## 2020-05-24 ENCOUNTER — Inpatient Hospital Stay (HOSPITAL_COMMUNITY): Payer: 59

## 2020-05-24 DIAGNOSIS — I951 Orthostatic hypotension: Secondary | ICD-10-CM

## 2020-05-24 DIAGNOSIS — E869 Volume depletion, unspecified: Secondary | ICD-10-CM | POA: Diagnosis not present

## 2020-05-24 LAB — CBC
HCT: 30.7 % — ABNORMAL LOW (ref 36.0–46.0)
Hemoglobin: 9.7 g/dL — ABNORMAL LOW (ref 12.0–15.0)
MCH: 29 pg (ref 26.0–34.0)
MCHC: 31.6 g/dL (ref 30.0–36.0)
MCV: 91.6 fL (ref 80.0–100.0)
Platelets: 289 10*3/uL (ref 150–400)
RBC: 3.35 MIL/uL — ABNORMAL LOW (ref 3.87–5.11)
RDW: 16.9 % — ABNORMAL HIGH (ref 11.5–15.5)
WBC: 11.7 10*3/uL — ABNORMAL HIGH (ref 4.0–10.5)
nRBC: 0 % (ref 0.0–0.2)

## 2020-05-24 LAB — ECHOCARDIOGRAM COMPLETE
Area-P 1/2: 4.39 cm2
Height: 63 in
S' Lateral: 3 cm
Weight: 1502.66 oz

## 2020-05-24 MED ORDER — DICYCLOMINE HCL 20 MG PO TABS
20.0000 mg | ORAL_TABLET | Freq: Once | ORAL | Status: AC
  Administered 2020-05-24: 20 mg via ORAL
  Filled 2020-05-24: qty 1

## 2020-05-24 MED ORDER — GLYCERIN (LAXATIVE) 2.1 G RE SUPP
1.0000 | Freq: Once | RECTAL | Status: AC
  Administered 2020-05-24: 1 via RECTAL
  Filled 2020-05-24: qty 1

## 2020-05-24 MED ORDER — MAGNESIUM HYDROXIDE 400 MG/5ML PO SUSP
30.0000 mL | Freq: Every day | ORAL | Status: DC | PRN
  Administered 2020-05-24: 30 mL via ORAL
  Filled 2020-05-24: qty 30

## 2020-05-24 MED ORDER — POLYETHYLENE GLYCOL 3350 17 G PO PACK
17.0000 g | PACK | Freq: Every day | ORAL | Status: DC
  Administered 2020-05-25: 17 g via ORAL
  Filled 2020-05-24: qty 1

## 2020-05-24 NOTE — Plan of Care (Signed)

## 2020-05-24 NOTE — Progress Notes (Signed)
Reconsult:  Hemoglobin trending down, continued hypotension  Subjective: Patient was sitting up on bed, eating her lunch. She states that she has been able to tolerate solids for the last several days, she was having about 1 bowel movement today, however has not had a bowel movement yesterday or today and feels slightly distended. Overall, reports improvement in nausea, vomiting however, continues to have dizziness when she stands up, not at rest.  Objective: Vital signs in last 24 hours: Temp:  [98 F (36.7 C)-98.6 F (37 C)] 98 F (36.7 C) (10/09 0530) Pulse Rate:  [96-136] 136 (10/09 1001) Resp:  [18] 18 (10/09 0530) BP: (56-106)/(41-77) 60/41 (10/09 1001) SpO2:  [99 %] 99 % (10/09 0530) Weight change:  Last BM Date: 05/22/20  PE: Cachectic appearing GENERAL: Not in distress, able to speak in full sentences ABDOMEN: Slightly distended, but nontender, normoactive bowel sounds EXTREMITIES: No deformity, no edema  Lab Results: Results for orders placed or performed during the hospital encounter of 05/16/20 (from the past 48 hour(s))  CBC     Status: Abnormal   Collection Time: 05/23/20  5:00 AM  Result Value Ref Range   WBC 10.7 (H) 4.0 - 10.5 K/uL   RBC 3.47 (L) 3.87 - 5.11 MIL/uL   Hemoglobin 10.1 (L) 12.0 - 15.0 g/dL   HCT 31.6 (L) 36 - 46 %   MCV 91.1 80.0 - 100.0 fL   MCH 29.1 26.0 - 34.0 pg   MCHC 32.0 30.0 - 36.0 g/dL   RDW 17.0 (H) 11.5 - 15.5 %   Platelets 313 150 - 400 K/uL   nRBC 0.0 0.0 - 0.2 %    Comment: Performed at [facility], [place] [address], [place], [place] [address]  CBC     Status: Abnormal   Collection Time: 05/24/20  5:56 AM  Result Value Ref Range   WBC 11.7 (H) 4.0 - 10.5 K/uL   RBC 3.35 (L) 3.87 - 5.11 MIL/uL   Hemoglobin 9.7 (L) 12.0 - 15.0 g/dL   HCT 30.7 (L) 36 - 46 %   MCV 91.6 80.0 - 100.0 fL   MCH 29.0 26.0 - 34.0 pg   MCHC 31.6 30.0 - 36.0 g/dL   RDW 16.9 (H) 11.5 - 15.5 %   Platelets 289 150 - 400 K/uL   nRBC  0.0 0.0 - 0.2 %    Comment: Performed at [facility], [place] [address], [place], [place] [address]    Studies/Results: No results found.  Medications: I have reviewed the patient's current medications.  Assessment: Complex GI history with intestinal pseudoobstruction, small bowel dysmotility, cyclical nausea and vomiting and constipation  Hemoglobin slowly trending down from 11.3-10.6, 10.1 and 9.7 today without obvious melena or hematochezia. Normal BUN 11 Status post EGD on 05/20/2020 with erythematous gastric mucosa, biopsy showed reactive gastropathy and no evidence of H. Pylori.    Plan: Ongoing hypotension is concerning along with ongoing tachycardia Although hemoglobin has slightly trended down, there has been no obvious melena and hematochezia, most importantly had an unremarkable EGD 4 days ago.  Patient is concerned about constipation, recommend using MiraLAX, she was on Motegrity 1 mg p.o. daily at home, advised patient to obtain her medication from home and use it while she is in the hospital, will give glycerin suppository x1 dose for now.  Discussed the same with Dr. Maylene Roes, perhaps patient will benefit from cardiology evaluation for orthostatic hypotension and ongoing tachycardia despite resolution of nausea/vomiting/diarrhea and volume repletion.  Ronnette Juniper, MD 05/24/2020, 12:14 PM

## 2020-05-24 NOTE — Progress Notes (Signed)
  Echocardiogram TTE has been performed.  Jannett Celestine 05/24/2020, 12:23 PM

## 2020-05-24 NOTE — Progress Notes (Signed)
PROGRESS NOTE    Stacy [name]  MWN:[id number] DOB: [birth year] DOA: 05/16/2020 PCP: [name], MD     Brief Narrative:  Stacy [name] is a 39 yr old female. She states that she used to struggle with weight loss. However, about 7 years ago she began losing weight without trying. She had intermittent nausea and vomiting and frequently not being able to eat. She continues to lose weight. She says it is like a nightmare. Recently the patient was discharged from this institution after a stay for intestinal pseudo-obstruction.   The patient carries a past medical history significant for anxiety, chronic intestinal pseudo-obstruction, GERD, and FTT.  In the ED she was found to be hypotensive with tachycardia. Temperature, respiratory rate, and O2 saturations were normal. Creatinine is 0.45. Fe 23, TIBC 85, Ferritin is 147, albumin is 1.5, LFT's are normal. Lactic Acid is 2.0 upon presentation, but came down to 1.6 with hydration. WBC is 14.1 (increased bands and neutrophils), and Hemoglobin is 8.7. This is down from 9.2 on 05/01/2020. She is negative for COVID.   She was admitted for hypotension and volume depletion.   New events last 24 hours / Subjective: Remains severely orthostatic positive 106/77 --> 56/44. Minimally symptomatic with this today however. We discussed her slow downtrending Hgb, although she has not had overt GI blood loss. She also has not had a menstrual cycle in few months. We discussed GI consult, cardiology consult, echocardiogram.   Assessment & Plan:   Principal Problem:   Volume depletion Active Problems:   Underweight   Iron deficiency anemia   Chronic intestinal pseudo-obstruction   GERD (gastroesophageal reflux disease)   Abdominal pain with vomiting   Hypokalemia   Severe protein-calorie malnutrition (HCC)   Failure to thrive in adult   Nausea and vomiting   Sepsis (HCC)   Hypomagnesemia   Constipation   Volume depletion, gastrointestinal  loss   Orthostatic hypotension secondary to volume depletion, nausea, vomiting, diarrhea and suspected component of adrenal insufficiency  -Unable to complete cosyntropin stim test due to starting on IV Solu-Cortef on admission -Volume depletion thought to be secondary to ongoing GI loss -Continue midodrine, increased dose 10 mg 3 times daily. Fludrocortisone 0.1mg  daily.  Repeat orthostatic vital signs in the morning -Echocardiogram ordered -Cardiology consulted; they will see in AM   Proteus mirabilis, E. coli UTI -Augmentin  Cyclical vomiting syndrome -Appreciate GI, follow-up with them outpatient  Iron deficiency anemia -Transfused 2u pRBC 10/4 -Status post EGD 10/5 which showed normal esophagus, normal duodenum, erythematous mucosa in the stomach -IV Feraheme given -PPI -Continue to monitor CBC  -Appreciate GI re-evaluating her today   Severe protein calorie malnutrition -Appreciate dietitian    DVT prophylaxis:  Place and maintain sequential compression device Start: 05/19/20 0943 Place TED hose Start: 05/17/20 1315  Code Status: Full code Family Communication: No family at bedside  Disposition Plan:  Status is: Inpatient  Remains inpatient appropriate because:Hemodynamically unstable   Dispo: The patient is from: Home              Anticipated d/c is to: Home              Anticipated d/c date is: 1 day              Patient currently is not medically stable to d/c.  Orthostatic positive this morning.  Continue Midodrine, fludrocortisone, obtain echo. Cardiology and GI consulted. Repeat orthostatic vital signs in the morning.   Antimicrobials:  Anti-infectives (  From admission, onward)   Start     Dose/Rate Route Frequency Ordered Stop   05/19/20 1315  amoxicillin-clavulanate (AUGMENTIN) 875-125 MG per tablet 1 tablet        1 tablet Oral Every 12 hours 05/19/20 1219 05/24/20 0959   05/16/20 2000  piperacillin-tazobactam (ZOSYN) IVPB 3.375 g  Status:   Discontinued        3.375 g 12.5 mL/hr over 240 Minutes Intravenous Every 8 hours 05/16/20 1851 05/19/20 1219   05/16/20 1800  piperacillin-tazobactam (ZOSYN) IVPB 3.375 g  Status:  Discontinued        3.375 g 12.5 mL/hr over 240 Minutes Intravenous Every 8 hours 05/16/20 1741 05/16/20 1851       Objective: Vitals:   05/24/20 0956 05/24/20 0958 05/24/20 0959 05/24/20 1001  BP: 106/77 (!) 80/60 (!) 56/44 (!) 60/41  Pulse: (!) 103 (!) 115 (!) 128 (!) 136  Resp:      Temp:      TempSrc:      SpO2:      Weight:      Height:        Intake/Output Summary (Last 24 hours) at 05/24/2020 1223 Last data filed at 05/24/2020 0530 Gross per 24 hour  Intake 540 ml  Output 300 ml  Net 240 ml   Filed Weights   05/16/20 0841 05/20/20 0849  Weight: 42.6 kg 42.6 kg    Examination: General exam: Appears calm and comfortable, thin, nontoxic appearing Respiratory system: Respiratory effort normal. Psychiatry: Judgement and insight appear stable. Mood & affect appropriate.   Data Reviewed: I have personally reviewed following labs and imaging studies  CBC: Recent Labs  Lab 05/17/20 1418 05/17/20 1418 05/18/20 0556 05/19/20 0553 05/20/20 0502 05/21/20 0408 05/22/20 0417 05/23/20 0500 05/24/20 0556  WBC 8.7   < > 9.5   < > 10.7* 15.1* 15.5* 10.7* 11.7*  NEUTROABS 6.4  --  6.9  --   --   --   --   --   --   HGB 8.1*   < > 8.2*   < > 10.7* 11.3* 10.6* 10.1* 9.7*  HCT 25.1*   < > 25.0*   < > 32.3* 34.8* 35.4* 31.6* 30.7*  MCV 91.6   < > 90.9   < > 88.7 88.8 97.8 91.1 91.6  PLT 362   < > 326   < > 255 284 254 313 289   < > = values in this interval not displayed.   Basic Metabolic Panel: Recent Labs  Lab 05/18/20 0556 05/19/20 0553 05/20/20 0502 05/21/20 0408 05/22/20 0417  NA 134* 135 136 137 136  K 3.2* 3.1* 4.7 3.8 3.9  CL 104 105 101 99 99  CO2 21* 25 29 31 30   GLUCOSE 209* 80 87 99 89  BUN 12 7 7 8 11   CREATININE 0.33* 0.32* 0.34* 0.38* 0.37*  CALCIUM 7.6* 8.3* 8.2*  8.2* 8.5*  MG 2.0 1.7 2.0 1.8 1.9  PHOS 3.2 2.1* 3.5 3.4  --    GFR: Estimated Creatinine Clearance: 62.9 mL/min (A) (by C-G formula based on SCr of 0.37 mg/dL (L)). Liver Function Tests: Recent Labs  Lab 05/18/20 0556 05/19/20 0553 05/20/20 0502 05/21/20 0408  AST 10*  --   --   --   ALT 9  --   --   --   ALKPHOS 57  --   --   --   BILITOT 0.7  --   --   --  PROT 3.6*  --   --   --   ALBUMIN 1.4* 2.7* 2.7* 2.5*   No results for input(s): LIPASE, AMYLASE in the last 168 hours. No results for input(s): AMMONIA in the last 168 hours. Coagulation Profile: No results for input(s): INR, PROTIME in the last 168 hours. Cardiac Enzymes: No results for input(s): CKTOTAL, CKMB, CKMBINDEX, TROPONINI in the last 168 hours. BNP (last 3 results) No results for input(s): PROBNP in the last 8760 hours. HbA1C: No results for input(s): HGBA1C in the last 72 hours. CBG: No results for input(s): GLUCAP in the last 168 hours. Lipid Profile: No results for input(s): CHOL, HDL, LDLCALC, TRIG, CHOLHDL, LDLDIRECT in the last 72 hours. Thyroid Function Tests: No results for input(s): TSH, T4TOTAL, FREET4, T3FREE, THYROIDAB in the last 72 hours. Anemia Panel: No results for input(s): VITAMINB12, FOLATE, FERRITIN, TIBC, IRON, RETICCTPCT in the last 72 hours. Sepsis Labs: No results for input(s): PROCALCITON, LATICACIDVEN in the last 168 hours.  Recent Results (from the past 240 hour(s))  Respiratory Panel by RT PCR (Flu A&B, Covid) - Nasopharyngeal Swab     Status: None   Collection Time: 05/16/20 10:54 AM   Specimen: Nasopharyngeal Swab  Result Value Ref Range Status   SARS Coronavirus 2 by RT PCR NEGATIVE NEGATIVE Final    Comment: (NOTE) SARS-CoV-2 target nucleic acids are NOT DETECTED.  The SARS-CoV-2 RNA is generally detectable in upper respiratoy specimens during the acute phase of infection. The lowest concentration of SARS-CoV-2 viral copies this assay can detect is 131 copies/mL. A  negative result does not preclude SARS-Cov-2 infection and should not be used as the sole basis for treatment or other patient management decisions. A negative result may occur with  improper specimen collection/handling, submission of specimen other than nasopharyngeal swab, presence of viral mutation(s) within the areas targeted by this assay, and inadequate number of viral copies (<131 copies/mL). A negative result must be combined with clinical observations, patient history, and epidemiological information. The expected result is Negative.  Fact Sheet for Patients:  PinkCheek.be  Fact Sheet for Healthcare Providers:  GravelBags.it  This test is no t yet approved or cleared by the [place] FDA and  has been authorized for detection and/or diagnosis of SARS-CoV-2 by FDA under an Emergency Use Authorization (EUA). This EUA will remain  in effect (meaning this test can be used) for the duration of the COVID-19 declaration under Section 564(b)(1) of the Act, 21 U.S.C. section 360bbb-3(b)(1), unless the authorization is terminated or revoked sooner.     Influenza A by PCR NEGATIVE NEGATIVE Final   Influenza B by PCR NEGATIVE NEGATIVE Final    Comment: (NOTE) The Xpert Xpress SARS-CoV-2/FLU/RSV assay is intended as an aid in  the diagnosis of influenza from Nasopharyngeal swab specimens and  should not be used as a sole basis for treatment. Nasal washings and  aspirates are unacceptable for Xpert Xpress SARS-CoV-2/FLU/RSV  testing.  Fact Sheet for Patients: PinkCheek.be  Fact Sheet for Healthcare Providers: GravelBags.it  This test is not yet approved or cleared by the [place] FDA and  has been authorized for detection and/or diagnosis of SARS-CoV-2 by  FDA under an Emergency Use Authorization (EUA). This EUA will remain  in effect (meaning this test can  be used) for the duration of the  Covid-19 declaration under Section 564(b)(1) of the Act, 21  U.S.C. section 360bbb-3(b)(1), unless the authorization is  terminated or revoked. Performed at [facility], [address] [place], [place] [address]   Culture, blood (single)     Status: None   Collection Time: 05/16/20 11:04 AM   Specimen: BLOOD  Result Value Ref Range Status   Specimen Description   Final    BLOOD LEFT ANTECUBITAL Performed at [facility] [address], [place], [place] [address]    Special Requests   Final    BOTTLES DRAWN AEROBIC AND ANAEROBIC Blood Culture adequate volume Performed at [facility] [address] [place], [place] [address]    Culture   Final    NO GROWTH 5 DAYS Performed at [facility], [place] [address], [place], [place] [address]    Report Status 05/21/2020 FINAL  Final  Urine culture     Status: Abnormal   Collection Time: 05/16/20  5:14 PM   Specimen: Urine, Random  Result Value Ref Range Status   Specimen Description   Final    URINE, RANDOM Performed at [facility] [address] [place], [place] [address]    Special Requests   Final    NONE Performed at [facility], [place] [address], [place],  [address]    Culture (A)  Final    >=100,000 COLONIES/mL PROTEUS MIRABILIS >=100,000 COLONIES/mL ESCHERICHIA COLI    Report Status 05/19/2020 FINAL  Final   Organism ID, Bacteria PROTEUS MIRABILIS (A)  Final   Organism ID, Bacteria ESCHERICHIA COLI (A)  Final      Susceptibility   Escherichia coli - MIC*    AMPICILLIN <=2 SENSITIVE Sensitive     CEFAZOLIN <=4 SENSITIVE Sensitive     CEFTRIAXONE <=0.25 SENSITIVE Sensitive     CIPROFLOXACIN >=4 RESISTANT Resistant     GENTAMICIN <=1 SENSITIVE Sensitive     IMIPENEM <=0.25 SENSITIVE Sensitive     NITROFURANTOIN <=16 SENSITIVE Sensitive     TRIMETH/SULFA <=20 SENSITIVE  Sensitive     AMPICILLIN/SULBACTAM <=2 SENSITIVE Sensitive     PIP/TAZO <=4 SENSITIVE Sensitive     * >=100,000 COLONIES/mL ESCHERICHIA COLI   Proteus mirabilis - MIC*    AMPICILLIN <=2 SENSITIVE Sensitive     CEFAZOLIN <=4 SENSITIVE Sensitive     CEFTRIAXONE <=0.25 SENSITIVE Sensitive     CIPROFLOXACIN <=0.25 SENSITIVE Sensitive     GENTAMICIN <=1 SENSITIVE Sensitive     IMIPENEM 0.5 SENSITIVE Sensitive     NITROFURANTOIN RESISTANT Resistant     TRIMETH/SULFA <=20 SENSITIVE Sensitive     AMPICILLIN/SULBACTAM <=2 SENSITIVE Sensitive     PIP/TAZO <=4 SENSITIVE Sensitive     * >=100,000 COLONIES/mL PROTEUS MIRABILIS  Gastrointestinal Panel by PCR , Stool     Status: None   Collection Time: 05/16/20  5:14 PM   Specimen: Stool  Result Value Ref Range Status   Campylobacter species NOT DETECTED NOT DETECTED Final   Plesimonas shigelloides NOT DETECTED NOT DETECTED Final   Salmonella species NOT DETECTED NOT DETECTED Final   Yersinia enterocolitica NOT DETECTED NOT DETECTED Final   Vibrio species NOT DETECTED NOT DETECTED Final   Vibrio cholerae NOT DETECTED NOT DETECTED Final   Enteroaggregative E coli (EAEC) NOT DETECTED NOT DETECTED Final   Enteropathogenic E coli (EPEC) NOT DETECTED NOT DETECTED Final   Enterotoxigenic E coli (ETEC) NOT DETECTED NOT DETECTED Final   Shiga like toxin producing E coli (STEC) NOT DETECTED NOT DETECTED Final   Shigella/Enteroinvasive E coli (EIEC) NOT DETECTED NOT DETECTED Final   Cryptosporidium NOT DETECTED NOT DETECTED Final   Cyclospora  cayetanensis NOT DETECTED NOT DETECTED Final   Entamoeba histolytica NOT DETECTED NOT DETECTED Final   Giardia lamblia NOT DETECTED NOT DETECTED Final   Adenovirus F40/41 NOT DETECTED NOT DETECTED Final   Astrovirus NOT DETECTED NOT DETECTED Final   Norovirus GI/GII NOT DETECTED NOT DETECTED Final   Rotavirus A NOT DETECTED NOT DETECTED Final   Sapovirus (I, II, IV, and V) NOT DETECTED NOT DETECTED Final     Comment: Performed at [facility], [address] [place], [place] [address]      Radiology Studies: No results found.    Scheduled Meds: . (feeding supplement) PROSource Plus  30 mL Oral BID BM  . fludrocortisone  0.1 mg Oral Daily  . Glycerin (Adult)  1 suppository Rectal Once  . midodrine  10 mg Oral TID WC  . multivitamin with minerals  1 tablet Oral Daily  . pantoprazole  40 mg Oral BID AC  . [START ON 05/25/2020] polyethylene glycol  17 g Oral Daily   Continuous Infusions:   LOS: 8 days      Time spent: 25 minutes   Dessa Phi, DO [facility] 05/24/2020, 12:23 PM   Available via Epic secure chat 7am-7pm After these hours, please refer to coverage provider listed on [email or web address]

## 2020-05-25 DIAGNOSIS — I951 Orthostatic hypotension: Secondary | ICD-10-CM

## 2020-05-25 DIAGNOSIS — E869 Volume depletion, unspecified: Secondary | ICD-10-CM | POA: Diagnosis not present

## 2020-05-25 LAB — CBC
HCT: 28.5 % — ABNORMAL LOW (ref 36.0–46.0)
Hemoglobin: 9.2 g/dL — ABNORMAL LOW (ref 12.0–15.0)
MCH: 29.7 pg (ref 26.0–34.0)
MCHC: 32.3 g/dL (ref 30.0–36.0)
MCV: 91.9 fL (ref 80.0–100.0)
Platelets: 352 10*3/uL (ref 150–400)
RBC: 3.1 MIL/uL — ABNORMAL LOW (ref 3.87–5.11)
RDW: 17.3 % — ABNORMAL HIGH (ref 11.5–15.5)
WBC: 10.9 10*3/uL — ABNORMAL HIGH (ref 4.0–10.5)
nRBC: 0 % (ref 0.0–0.2)

## 2020-05-25 MED ORDER — SODIUM CHLORIDE 0.9 % IV SOLN
500.0000 mL | Freq: Once | INTRAVENOUS | Status: AC
  Administered 2020-05-25: 500 mL via INTRAVENOUS

## 2020-05-25 MED ORDER — POLYETHYLENE GLYCOL 3350 17 G PO PACK
17.0000 g | PACK | Freq: Two times a day (BID) | ORAL | Status: DC
  Administered 2020-05-25 – 2020-05-26 (×2): 17 g via ORAL
  Filled 2020-05-25 (×2): qty 1

## 2020-05-25 NOTE — Plan of Care (Signed)

## 2020-05-25 NOTE — Progress Notes (Signed)
Subjective: Clinically she states she is improving. She has been able to finish all her meals for breakfast lunch and dinner. She has not had a bowel movement, today is the third day. She took Motegrity 1 mg, her home medication today morning along with MiraLAX today morning. She complains of abdominal fullness due to not having bowel movements.  Objective: Vital signs in last 24 hours: Temp:  [97.5 F (36.4 C)-98.3 F (36.8 C)] 98.3 F (36.8 C) (10/10 0432) Pulse Rate:  [87-115] 88 (10/10 0432) Resp:  [18-20] 18 (10/10 0432) BP: (69-103)/(48-75) 94/60 (10/10 0432) SpO2:  [99 %-100 %] 99 % (10/10 0432) Weight change:  Last BM Date: 05/22/20  PE: Cachectic appearing but not in distress GENERAL: Moist mucous membranes ABDOMEN: Slightly distended, mild generalized tenderness, normoactive bowel sounds EXTREMITIES: No deformity, no edema  Lab Results: Results for orders placed or performed during the hospital encounter of 05/16/20 (from the past 48 hour(s))  CBC     Status: Abnormal   Collection Time: 05/24/20  5:56 AM  Result Value Ref Range   WBC 11.7 (H) 4.0 - 10.5 K/uL   RBC 3.35 (L) 3.87 - 5.11 MIL/uL   Hemoglobin 9.7 (L) 12.0 - 15.0 g/dL   HCT 30.7 (L) 36 - 46 %   MCV 91.6 80.0 - 100.0 fL   MCH 29.0 26.0 - 34.0 pg   MCHC 31.6 30.0 - 36.0 g/dL   RDW 16.9 (H) 11.5 - 15.5 %   Platelets 289 150 - 400 K/uL   nRBC 0.0 0.0 - 0.2 %    Comment: Performed at [facility], [place] [address] [place], [place] [address]  CBC     Status: Abnormal   Collection Time: 05/25/20  5:14 AM  Result Value Ref Range   WBC 10.9 (H) 4.0 - 10.5 K/uL   RBC 3.10 (L) 3.87 - 5.11 MIL/uL   Hemoglobin 9.2 (L) 12.0 - 15.0 g/dL   HCT 28.5 (L) 36 - 46 %   MCV 91.9 80.0 - 100.0 fL   MCH 29.7 26.0 - 34.0 pg   MCHC 32.3 30.0 - 36.0 g/dL   RDW 17.3 (H) 11.5 - 15.5 %   Platelets 352 150 - 400 K/uL   nRBC 0.0 0.0 - 0.2 %    Comment: Performed at [facility], [place]  [address], [place], [place] [address]    Studies/Results: ECHOCARDIOGRAM COMPLETE  Result Date: 05/24/2020    ECHOCARDIOGRAM REPORT   Patient Name:   Stacy [name] Date of Exam: 05/24/2020 Medical Rec #:  [id number]       Height:       63.0 in Accession #:    [id number]      Weight:       93.9 lb Date of Birth:  April 27, 1981       BSA:          1.402 m Patient Age:    39 years        BP:           60/41 mmHg Patient Gender: F               HR:           102 bpm. Exam Location:  Inpatient Procedure: 2D Echo Indications:    orthostatic hypotension  History:        Patient has no prior history of Echocardiogram examinations.  Sonographer:    Jannett Celestine RDCS (AE) Referring Phys: [phone] [facility]  CHOI  Sonographer Comments: Suboptimal apical window. Image acquisition challenging due to respiratory motion and Image acquisition challenging due to patient body habitus. see comments IMPRESSIONS  1. Grossly, no wall motiion abnormalities noted. Left ventricular ejection fraction, by estimation, is 50 to 55%. The left ventricle has low normal function. Left ventricular endocardial border not optimally defined to evaluate regional wall motion. Left ventricular diastolic parameters were normal.  2. Right ventricular systolic function is normal. The right ventricular size is normal.  3. The mitral valve is grossly normal. No evidence of mitral valve regurgitation.  4. The aortic valve was not well visualized. Aortic valve regurgitation is trivial.  5. The inferior vena cava is normal in size with greater than 50% respiratory variability, suggesting right atrial pressure of 3 mmHg. Comparison(s): No prior Echocardiogram. FINDINGS  Left Ventricle: Grossly, no wall motiion abnormalities noted. Left ventricular ejection fraction, by estimation, is 50 to 55%. The left ventricle has low normal function. Left ventricular endocardial border not optimally defined to evaluate regional wall motion. The left ventricular internal cavity  size was normal in size. There is no left ventricular hypertrophy. Left ventricular diastolic parameters were normal. Right Ventricle: The right ventricular size is normal. Right vetricular wall thickness was not well visualized. Right ventricular systolic function is normal. Left Atrium: Left atrial size was normal in size. Right Atrium: Right atrial size was normal in size. Pericardium: Trivial pericardial effusion is present. Mitral Valve: The mitral valve is grossly normal. No evidence of mitral valve regurgitation. Tricuspid Valve: The tricuspid valve is grossly normal. Tricuspid valve regurgitation is not demonstrated. Aortic Valve: The aortic valve was not well visualized. Aortic valve regurgitation is trivial. Pulmonic Valve: The pulmonic valve was not well visualized. Pulmonic valve regurgitation is not visualized. Aorta: The aortic root is normal in size and structure, the aortic arch was not well visualized and the ascending aorta was not well visualized. Venous: The inferior vena cava is normal in size with greater than 50% respiratory variability, suggesting right atrial pressure of 3 mmHg. IAS/Shunts: The interatrial septum was not assessed.  LEFT VENTRICLE PLAX 2D LVIDd:         4.50 cm  Diastology LVIDs:         3.00 cm  LV e' medial:    11.50 cm/s LV PW:         0.80 cm  LV E/e' medial:  4.9 LV IVS:        0.60 cm  LV e' lateral:   14.30 cm/s LVOT diam:     1.80 cm  LV E/e' lateral: 3.9 LV SV:         30 LV SV Index:   22 LVOT Area:     2.54 cm  RIGHT VENTRICLE RV S prime:     10.30 cm/s TAPSE (M-mode): 1.5 cm LEFT ATRIUM         Index LA diam:    2.60 cm 1.85 cm/m  AORTIC VALVE LVOT Vmax:   73.10 cm/s LVOT Vmean:  59.800 cm/s LVOT VTI:    0.119 m  AORTA Ao Root diam: 2.50 cm MITRAL VALVE MV Area (PHT): 4.39 cm    SHUNTS MV Decel Time: 173 msec    Systemic VTI:  0.12 m MV E velocity: 56.10 cm/s  Systemic Diam: 1.80 cm MV A velocity: 50.10 cm/s MV E/A ratio:  1.12 Rudean Haskell MD  Electronically signed by Rudean Haskell MD Signature Date/Time: 05/24/2020/12:58:34 PM    Final  Medications: I have reviewed the patient's current medications.  Assessment: History of chronic intestinal pseudoobstruction, small bowel dysmotility, cyclical nausea and vomiting, chronic constipation  Hypotension, mostly orthostatic and tachycardia: Has been evaluated by cardiology today, it seems she is not yet volume resuscitated, they have recommended increasing sodium intake to 4 g a day and for her to drink at least 3 to 4 L a day while continuing midodrine and fludrocortisone  Mild drop in hemoglobin noted over the last 4 days without obvious melena or hematochezia  It is important to watch for refeeding syndrome, replace potassium, magnesium, phosphorus as needed   Plan: Continue supportive care Continue MiraLAX twice daily and Motegrity 1 mg daily from home. Continue pantoprazole 40 mg twice daily, although she had a normal endoscopy, her hemoglobin has been drifting down slowly.   Ronnette Juniper, MD 05/25/2020, 10:31 AM

## 2020-05-25 NOTE — Consult Note (Signed)
Cardiology Consultation:   Patient ID: Stacy [name] MRN: [id number]; DOB: May 27, 1981  Admit date: 05/16/2020 Date of Consult: 05/25/2020  Primary Care Provider: [name], MD [facility] Cardiologist: Skeet Latch, MD [facility] Electrophysiologist:  None    Patient Profile:   Stacy [name] is a 39 y.o. female with a hx of anxiety, unintentional weight loss, failure to thrive, GERD, and chronic intestinal pseudo-obstruction who is being seen today for the evaluation of orthostatic hypotension at the request of Dr. Maylene Roes.  History of Present Illness:   Stacy [name] reports 7 years of unintentional weight loss.  Has had episodes of nausea and vomiting and inability to eat.  She continues to lose weight.  She was she was hospitalized for intestinal pseudo-obstruction.  This hospitalization she has been profoundly hypotensive and orthostatic, with the lowest recorded blood pressure 56/44.  She did receive several liters of IV fluids upon admission.  She has not received any in the last several days.  Her hospitalist team started her on midodrine and increase the dose to 10 mg 3 times daily.  She is also on fludrocortisone.  An echocardiogram this admission revealed LVEF 50 to 55% with no other significant abnormalities.  Her labs have been notable for mild anemia.  Hemoglobin slowly downtrending to 9.2 today.  Creatinine is low at 0.34.  Albumin is 2.7.  This is an improvement from 1.4 on admission.  Thyroid was normal 12/2019.  They plan to check for cortisol levels but were unable given her recent steroid use.  Cardiology was consulted for persistent orthostatic hypotension despite TED hose and fluid repletion.  She reports that lately she has been eating and drinking well.  She has no more nausea or vomiting.  She has not had any lower extremity edema, orthopnea, or PND.  She denies any chest pain and her breathing is stable.   Past Medical History:  Diagnosis Date  .  Anxiety   . Benign liver cyst   . Chronic intestinal pseudo-obstruction   . GERD (gastroesophageal reflux disease)   . History of UTI     Past Surgical History:  Procedure Laterality Date  . BIOPSY  05/20/2020   Procedure: BIOPSY;  Surgeon: Wonda Horner, MD;  Location: [facility];  Service: Endoscopy;;  . CESAREAN SECTION     twice 2010, 2013  . ESOPHAGOGASTRODUODENOSCOPY (EGD) WITH PROPOFOL N/A 05/20/2020   Procedure: ESOPHAGOGASTRODUODENOSCOPY (EGD) WITH PROPOFOL;  Surgeon: Wonda Horner, MD;  Location: [facility];  Service: Endoscopy;  Laterality: N/A;  Stacy [name]  ~2015     Home Medications:  Prior to Admission medications   Medication Sig Start Date End Date Taking? Authorizing Provider  acetaminophen (TYLENOL) 500 MG tablet Take 500 mg by mouth every 6 (six) hours as needed for headache (pain).   Yes [provider]  cholecalciferol (VITAMIN D3) 25 MCG (1000 UNIT) tablet TAKE 2 TABLETS (2,000 UNITS TOTAL) BY MOUTH DAILY. Patient taking differently: Take 1,000 Units by mouth 2 (two) times daily.  12/31/19  Yes [name], MD  cyanocobalamin (,VITAMIN B-12,) 1000 MCG/ML injection Inject 1,000 mcg into the muscle every 30 (thirty) days.   Yes [provider]  famotidine (PEPCID) 20 MG tablet TAKE 1 TABLET BY MOUTH EVERYDAY AT BEDTIME Patient taking differently: Take 20 mg by mouth at bedtime.  04/09/20  Yes [name], MD  ibuprofen (ADVIL) 200 MG tablet Take 400-600 mg by mouth every 6 (six) hours as needed for headache (  pain).   Yes [provider]  montelukast (SINGULAIR) 10 MG tablet Take 10 mg by mouth daily. 01/08/20  Yes [provider]  omeprazole (PRILOSEC) 40 MG capsule TAKE 1 CAPSULE BY MOUTH EVERY DAY Patient taking differently: Take 40 mg by mouth daily.  05/12/20  Yes [name], MD  ondansetron (ZOFRAN-ODT) 8 MG disintegrating tablet Take 8 mg by mouth 3 (three) times daily as needed for nausea/vomiting.  05/12/20  Yes [provider]  polyethylene glycol (MIRALAX / GLYCOLAX) 17 g packet Take 17 g by mouth 2 (two) times daily.   Yes [provider]  Probiotic Product (ALIGN PO) Take 1 capsule by mouth daily.   Yes [provider]  Prucalopride Succinate (MOTEGRITY) 2 MG TABS Take 1 tablet (2 mg total) by mouth daily. 05/14/20  Yes [name], MD  ciprofloxacin (CIPRO) 250 MG tablet Take 250 mg by mouth 2 (two) times daily. 3 day supply 05/12/20   [provider]  COMPRO 25 MG suppository Place 25 mg rectally every 12 (twelve) hours as needed for nausea. 05/12/20   [provider]  ondansetron (ZOFRAN ODT) 4 MG disintegrating tablet Take 1 tablet (4 mg total) by mouth every 8 (eight) hours as needed for nausea or vomiting. Patient not taking: Reported on 05/16/2020 05/01/20   Florencia Reasons, MD  POLY-IRON 150 150 MG capsule TAKE 1 CAPSULE BY MOUTH TWICE A DAY Patient not taking: Reported on 05/16/2020 04/11/20   [name], MD    Inpatient Medications: Scheduled Meds: . (feeding supplement) PROSource Plus  30 mL Oral BID BM  . fludrocortisone  0.1 mg Oral Daily  . midodrine  10 mg Oral TID WC  . multivitamin with minerals  1 tablet Oral Daily  . pantoprazole  40 mg Oral BID AC  . polyethylene glycol  17 g Oral Daily   Continuous Infusions:  PRN Meds: acetaminophen **OR** acetaminophen, magnesium hydroxide, melatonin, metoCLOPramide (REGLAN) injection, ondansetron **OR** ondansetron (ZOFRAN) IV  Allergies:    Allergies  Allergen Reactions  . Tetracyclines & Related Swelling    Swelling in spine; required spinal tap.   . Erythromycin Other (See Comments)    Swelling in spine; required spinal tap  . Raspberry Rash  . Sulfonamide Derivatives Rash    Reaction to cream    Social History:   Social History   Socioeconomic History  . Marital status: Married    Spouse name: Not on file  . Number of children: 2  . Years of education:  Not on file  . Highest education level: Not on file  Occupational History  . 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 Smoking status: Never Smoker  . Smokeless tobacco: Never Used  Vaping Use  . Vaping Use: Never used  Substance and Sexual Activity  . Alcohol use: No    Comment: socially   . Drug use: No  . Sexual activity: Yes    Birth control/protection: None  Other Topics Concern  . Not on file  Social History Narrative   Household: pt, husband , 2 children   2 boys: 2010, 2013   P2G2   Original  from [place], no foreign trips   Social Determinants of Radio broadcast assistant Strain:   . Difficulty of Paying Living Expenses: Not on file  Food Insecurity:   . Worried About Charity fundraiser in the Last Year: Not on file  . Ran Out of  Food in the Last Year: Not on file  Transportation Needs:   . Lack of Transportation (Medical): Not on file  . Lack of Transportation (Non-Medical): Not on file  Physical Activity:   . Days of Exercise per Week: Not on file  . Minutes of Exercise per Session: Not on file  Stress:   . Feeling of Stress : Not on file  Social Connections:   . Frequency of Communication with Friends and Family: Not on file  . Frequency of Social Gatherings with Friends and Family: Not on file  . Attends Religious Services: Not on file  . Active Member of Clubs or Organizations: Not on file  . Attends Archivist Meetings: Not on file  . Marital Status: Not on file  Intimate Partner Violence:   . Fear of Current or Ex-Partner: Not on file  . Emotionally Abused: Not on file  . Physically Abused: Not on file  . Sexually Abused: Not on file    Family History:    Family History  Problem Relation Age of Onset  . CAD Father   . Hypertension Father   . Diabetes Father   . Diabetes Brother   . Colon cancer Neg Hx   . Breast cancer Neg Hx      ROS:  Please see the history of present illness.   All other ROS  reviewed and negative.     Physical Exam/Data:   Vitals:   05/24/20 1313 05/24/20 1315 05/24/20 2039 05/25/20 0432  BP: (!) 69/48 97/75 103/75 94/60  Pulse: (!) 115 87 97 88  Resp:   20 18  Temp:   (!) 97.5 F (36.4 C) 98.3 F (36.8 C)  TempSrc:   Oral Oral  SpO2: 100%  100% 99%  Weight:      Height:       No intake or output data in the 24 hours ending 05/25/20 0945 Last 3 Weights 05/20/2020 05/16/2020 05/08/2020  Weight (lbs) 93 lb 14.7 oz 94 lb 94 lb  Weight (kg) 42.6 kg 42.638 kg 42.638 kg     VS:  BP 94/60 (BP Location: Right Arm)   Pulse 88   Temp 98.3 F (36.8 C) (Oral)   Resp 18   Ht 5\' 3"  (1.6 m)   Wt 42.6 kg   SpO2 99%   BMI 16.64 kg/m  , BMI Body mass index is 16.64 kg/m. GENERAL:  Frail.  Chronically ill-appearing HEENT: Pupils equal round and reactive, fundi not visualized, oral mucosa unremarkable NECK:  No jugular venous distention, waveform within normal limits, carotid upstroke brisk and symmetric, no bruits LUNGS:  Clear to auscultation bilaterally HEART:  RRR.  PMI not displaced or sustained,S1 and S2 within normal limits, no S3, no S4, no clicks, no rubs, no murmurs ABD:  Flat, positive bowel sounds normal in frequency in pitch, no bruits, no rebound, no guarding, no midline pulsatile mass, no hepatomegaly, no splenomegaly EXT:  2 plus pulses throughout, no edema, no cyanosis no clubbing SKIN:  No rashes no nodules NEURO:  Cranial nerves II through XII grossly intact, motor grossly intact throughout PSYCH:  Cognitively intact, oriented to person place and time   EKG:  The EKG was personally reviewed and demonstrates:  05/20/2020: Sinus tachycardia.  Rate 109 bpm.  Nonspecific T wave abnormalities. Telemetry:  Telemetry was personally reviewed and demonstrates: Sinus rhythm, sinus tachycardia  Relevant CV Studies:  Echo 05/24/20: IMPRESSIONS    1. Grossly, no wall motiion abnormalities noted. Left ventricular  ejection fraction, by estimation, is  50 to 55%. The left ventricle has low  normal function. Left ventricular endocardial border not optimally defined  to evaluate regional wall motion.  Left ventricular diastolic parameters were normal.  2. Right ventricular systolic function is normal. The right ventricular  size is normal.  3. The mitral valve is grossly normal. No evidence of mitral valve  regurgitation.  4. The aortic valve was not well visualized. Aortic valve regurgitation  is trivial.  5. The inferior vena cava is normal in size with greater than 50%  respiratory variability, suggesting right atrial pressure of 3 mmHg.   Laboratory Data:  High Sensitivity Troponin:  No results for input(s): TROPONINIHS in the last 720 hours.   Chemistry Recent Labs  Lab 05/19/20 0553 05/19/20 0553 05/20/20 0502 05/21/20 0408 05/22/20 0417  NA 135   < > 136 137 136  K 3.1*   < > 4.7 3.8 3.9  CL 105   < > 101 99 99  CO2 25   < > 29 31 30   GLUCOSE 80   < > 87 99 89  BUN 7   < > 7 8 11   CREATININE 0.32*   < > 0.34* 0.38* 0.37*  CALCIUM 8.3*   < > 8.2* 8.2* 8.5*  GFRNONAA >60   < > >60 >60 >60  GFRAA >60  --  >60  --   --   ANIONGAP 5   < > 6 7 7    < > = values in this interval not displayed.    Recent Labs  Lab 05/19/20 0553 05/20/20 0502 05/21/20 0408  ALBUMIN 2.7* 2.7* 2.5*   Hematology Recent Labs  Lab 05/23/20 0500 05/24/20 0556 05/25/20 0514  WBC 10.7* 11.7* 10.9*  RBC 3.47* 3.35* 3.10*  HGB 10.1* 9.7* 9.2*  HCT 31.6* 30.7* 28.5*  MCV 91.1 91.6 91.9  MCH 29.1 29.0 29.7  MCHC 32.0 31.6 32.3  RDW 17.0* 16.9* 17.3*  PLT 313 289 352   BNPNo results for input(s): BNP, PROBNP in the last 168 hours.  DDimer No results for input(s): DDIMER in the last 168 hours.   Radiology/Studies:  ECHOCARDIOGRAM COMPLETE  Result Date: 05/24/2020    ECHOCARDIOGRAM REPORT   Patient Name:   Stacy [name] Date of Exam: 05/24/2020 Medical Rec #:  [id number]       Height:       63.0 in Accession #:    [id number]       Weight:       93.9 lb Date of Birth:  07-03-1981       BSA:          1.402 m Patient Age:    39 years        BP:           60/41 mmHg Patient Gender: F               HR:           102 bpm. Exam Location:  Inpatient Procedure: 2D Echo Indications:    orthostatic hypotension  History:        Patient has no prior history of Echocardiogram examinations.  Sonographer:    Jannett Celestine RDCS (AE) Referring Phys: [id number] [facility]  Sonographer Comments: Suboptimal apical window. Image acquisition challenging due to respiratory motion and Image acquisition challenging due to patient body habitus. see comments IMPRESSIONS  1. Grossly, no wall motiion abnormalities noted. Left ventricular ejection fraction, by estimation, is  50 to 55%. The left ventricle has low normal function. Left ventricular endocardial border not optimally defined to evaluate regional wall motion. Left ventricular diastolic parameters were normal.  2. Right ventricular systolic function is normal. The right ventricular size is normal.  3. The mitral valve is grossly normal. No evidence of mitral valve regurgitation.  4. The aortic valve was not well visualized. Aortic valve regurgitation is trivial.  5. The inferior vena cava is normal in size with greater than 50% respiratory variability, suggesting right atrial pressure of 3 mmHg. Comparison(s): No prior Echocardiogram. FINDINGS  Left Ventricle: Grossly, no wall motiion abnormalities noted. Left ventricular ejection fraction, by estimation, is 50 to 55%. The left ventricle has low normal function. Left ventricular endocardial border not optimally defined to evaluate regional wall motion. The left ventricular internal cavity size was normal in size. There is no left ventricular hypertrophy. Left ventricular diastolic parameters were normal. Right Ventricle: The right ventricular size is normal. Right vetricular wall thickness was not well visualized. Right ventricular systolic function is normal.  Left Atrium: Left atrial size was normal in size. Right Atrium: Right atrial size was normal in size. Pericardium: Trivial pericardial effusion is present. Mitral Valve: The mitral valve is grossly normal. No evidence of mitral valve regurgitation. Tricuspid Valve: The tricuspid valve is grossly normal. Tricuspid valve regurgitation is not demonstrated. Aortic Valve: The aortic valve was not well visualized. Aortic valve regurgitation is trivial. Pulmonic Valve: The pulmonic valve was not well visualized. Pulmonic valve regurgitation is not visualized. Aorta: The aortic root is normal in size and structure, the aortic arch was not well visualized and the ascending aorta was not well visualized. Venous: The inferior vena cava is normal in size with greater than 50% respiratory variability, suggesting right atrial pressure of 3 mmHg. IAS/Shunts: The interatrial septum was not assessed.  LEFT VENTRICLE PLAX 2D LVIDd:         4.50 cm  Diastology LVIDs:         3.00 cm  LV e' medial:    11.50 cm/s LV PW:         0.80 cm  LV E/e' medial:  4.9 LV IVS:        0.60 cm  LV e' lateral:   14.30 cm/s LVOT diam:     1.80 cm  LV E/e' lateral: 3.9 LV SV:         30 LV SV Index:   22 LVOT Area:     2.54 cm  RIGHT VENTRICLE RV S prime:     10.30 cm/s TAPSE (M-mode): 1.5 cm LEFT ATRIUM         Index LA diam:    2.60 cm 1.85 cm/m  AORTIC VALVE LVOT Vmax:   73.10 cm/s LVOT Vmean:  59.800 cm/s LVOT VTI:    0.119 m  AORTA Ao Root diam: 2.50 cm MITRAL VALVE MV Area (PHT): 4.39 cm    SHUNTS MV Decel Time: 173 msec    Systemic VTI:  0.12 m MV E velocity: 56.10 cm/s  Systemic Diam: 1.80 cm MV A velocity: 50.10 cm/s MV E/A ratio:  1.12 Rudean Haskell MD Electronically signed by Rudean Haskell MD Signature Date/Time: 05/24/2020/12:58:34 PM    Final      Assessment and Plan:   # Orthostatic hypotension: Mrs. Linch has persistent orthostatic hypotension.  She has been trying to add salt to her foods and is now eating and  drinking well.  However she has had years of intermittent  nausea and vomiting and inability to keep her oral intake up.  She remains profoundly orthostatic with blood pressure dropping to 56/44 and a heart rate increasing to 136 with standing.  I reviewed her echocardiogram.  Her IVC was completely flat.  This indicates that she is not yet volume resuscitated.  I suggest giving her a couple more liters of IV fluids.  We will also track her ins and outs daily.  Additionally, we will check her weights daily.  Recommend that she continue to salt her foods and have at least 4 g of sodium daily.  She should be drinking at least 3-4 L daily.  Structurally her heart is normal.  Okay to continue midodrine and fludrocortisone for now.  If she continues to be orthostatic after volume repletion, could increase fludrocortisone to twice daily or increase the dose.  We could also consider Northera.  It does not seem that she has dysautonomia.  It seems that she has appropriate blood pressure and heart rate response for intravascular volume depletion.  We will also order an abdominal binder.       For questions or updates, please contact [place] Please consult [email or web address] for contact info under    Signed, Skeet Latch, MD  05/25/2020 9:45 AM

## 2020-05-25 NOTE — Progress Notes (Signed)
PROGRESS NOTE    Stacy [name]  BDZ:[id number] DOB: October 05, 1980 DOA: 05/16/2020 PCP: [name], MD     Brief Narrative:  Stacy [name] is a 39 yr old female with past medical history of  anxiety, chronic intestinal pseudo-obstruction, GERD, and FTT. About 7 years ago, she began losing weight without trying. She had intermittent nausea and vomiting and frequently not being able to eat. She continues to lose weight unintentionally. She has been followed closely by dietician as outpatient.   In the ED she was found to be hypotensive with tachycardia. Temperature, respiratory rate, and O2 saturations were normal. Creatinine is 0.45. Fe 23, TIBC 85, Ferritin is 147, albumin is 1.5, LFT's are normal. Lactic Acid is 2.0 upon presentation, but came down to 1.6 with hydration. WBC is 14.1 (increased bands and neutrophils), and Hemoglobin is 8.7. This is down from 9.2 on 05/01/2020. She is negative for COVID. She was admitted for hypotension and volume depletion.  She has remained orthostatic positive. She was started on midodrine and florinef. Cardiology was consulted as well as GI due to slow trending downward of Hgb.   New events last 24 hours / Subjective: Remains severely orthostatic positive 92/66 --> 55/37. Has been adding salt to her diet, has been tolerating PO without issue. Has not had BM yet and asking for enema.    Assessment & Plan:   Principal Problem:   Volume depletion Active Problems:   Hypotension due to hypovolemia   Underweight   Iron deficiency anemia   Chronic intestinal pseudo-obstruction   GERD (gastroesophageal reflux disease)   Abdominal pain with vomiting   Hypokalemia   Severe protein-calorie malnutrition (HCC)   Failure to thrive in adult   Nausea and vomiting   Sepsis (HCC)   Hypomagnesemia   Constipation   Volume depletion, gastrointestinal loss   Orthostatic hypotension secondary to volume depletion, nausea, vomiting, diarrhea and suspected  component of adrenal insufficiency  -Unable to complete cosyntropin stim test due to starting on IV Solu-Cortef on admission -Volume depletion thought to be secondary to ongoing GI loss -Continue midodrine, increased dose 10 mg 3 times daily. Fludrocortisone 0.1mg  daily.  Repeat orthostatic vital signs in the morning -Echocardiogram without structural abnl  -Appreciate cardiology, give IVF today   Proteus mirabilis, E. coli UTI -Completed antibiotics  Cyclical vomiting syndrome -Appreciate GI, follow-up with them outpatient  Iron deficiency anemia -Transfused 2u pRBC 10/4 -Status post EGD 10/5 which showed normal esophagus, normal duodenum, erythematous mucosa in the stomach -IV Feraheme given -PPI -Continue to monitor CBC  -Appreciate GI  -Consulted hematology for evaluation (patient has outpatient appt 10/12 that she will likely not make)   Severe protein calorie malnutrition -Appreciate dietitian    DVT prophylaxis:  Place and maintain sequential compression device Start: 05/19/20 0943 Place TED hose Start: 05/17/20 1315  Code Status: Full code Family Communication: Spouse at bedside  Disposition Plan:  Status is: Inpatient  Remains inpatient appropriate because:Hemodynamically unstable   Dispo: The patient is from: Home              Anticipated d/c is to: Home              Anticipated d/c date is: 1 day              Patient currently is not medically stable to d/c.  Orthostatic positive this morning.  Continue Midodrine, fludrocortisone. Cardiology and GI following. Hematology consulted.    Antimicrobials:  Anti-infectives (From admission, onward)  Start     Dose/Rate Route Frequency Ordered Stop   05/19/20 1315  amoxicillin-clavulanate (AUGMENTIN) 875-125 MG per tablet 1 tablet        1 tablet Oral Every 12 hours 05/19/20 1219 05/24/20 0959   05/16/20 2000  piperacillin-tazobactam (ZOSYN) IVPB 3.375 g  Status:  Discontinued        3.375 g 12.5 mL/hr over 240  Minutes Intravenous Every 8 hours 05/16/20 1851 05/19/20 1219   05/16/20 1800  piperacillin-tazobactam (ZOSYN) IVPB 3.375 g  Status:  Discontinued        3.375 g 12.5 mL/hr over 240 Minutes Intravenous Every 8 hours 05/16/20 1741 05/16/20 1851       Objective: Vitals:   05/25/20 1057 05/25/20 1059 05/25/20 1101 05/25/20 1106  BP: 92/66 (!) 80/53 (!) 55/37 (!) 88/52  Pulse: (!) 102 (!) 111 (!) 131 (!) 108  Resp:      Temp:      TempSrc:      SpO2:      Weight:      Height:       No intake or output data in the 24 hours ending 05/25/20 1214 Filed Weights   05/16/20 0841 05/20/20 0849  Weight: 42.6 kg 42.6 kg    Examination: General exam: Appears calm and comfortable, thin, non-toxic appearing   Central nervous system: Alert and oriented. Speech clear  Extremities: Symmetric Psychiatry: Judgement and insight appear stable. Mood & affect appropriate.    Data Reviewed: I have personally reviewed following labs and imaging studies  CBC: Recent Labs  Lab 05/21/20 0408 05/22/20 0417 05/23/20 0500 05/24/20 0556 05/25/20 0514  WBC 15.1* 15.5* 10.7* 11.7* 10.9*  HGB 11.3* 10.6* 10.1* 9.7* 9.2*  HCT 34.8* 35.4* 31.6* 30.7* 28.5*  MCV 88.8 97.8 91.1 91.6 91.9  PLT 284 254 313 289 784   Basic Metabolic Panel: Recent Labs  Lab 05/19/20 0553 05/20/20 0502 05/21/20 0408 05/22/20 0417  NA 135 136 137 136  K 3.1* 4.7 3.8 3.9  CL 105 101 99 99  CO2 25 29 31 30   GLUCOSE 80 87 99 89  BUN 7 7 8 11   CREATININE 0.32* 0.34* 0.38* 0.37*  CALCIUM 8.3* 8.2* 8.2* 8.5*  MG 1.7 2.0 1.8 1.9  PHOS 2.1* 3.5 3.4  --    GFR: Estimated Creatinine Clearance: 62.9 mL/min (A) (by C-G formula based on SCr of 0.37 mg/dL (L)). Liver Function Tests: Recent Labs  Lab 05/19/20 0553 05/20/20 0502 05/21/20 0408  ALBUMIN 2.7* 2.7* 2.5*   No results for input(s): LIPASE, AMYLASE in the last 168 hours. No results for input(s): AMMONIA in the last 168 hours. Coagulation Profile: No results  for input(s): INR, PROTIME in the last 168 hours. Cardiac Enzymes: No results for input(s): CKTOTAL, CKMB, CKMBINDEX, TROPONINI in the last 168 hours. BNP (last 3 results) No results for input(s): PROBNP in the last 8760 hours. HbA1C: No results for input(s): HGBA1C in the last 72 hours. CBG: No results for input(s): GLUCAP in the last 168 hours. Lipid Profile: No results for input(s): CHOL, HDL, LDLCALC, TRIG, CHOLHDL, LDLDIRECT in the last 72 hours. Thyroid Function Tests: No results for input(s): TSH, T4TOTAL, FREET4, T3FREE, THYROIDAB in the last 72 hours. Anemia Panel: No results for input(s): VITAMINB12, FOLATE, FERRITIN, TIBC, IRON, RETICCTPCT in the last 72 hours. Sepsis Labs: No results for input(s): PROCALCITON, LATICACIDVEN in the last 168 hours.  Recent Results (from the past 240 hour(s))  Respiratory Panel by RT PCR (Flu A&B, Covid) -  Nasopharyngeal Swab     Status: None   Collection Time: 05/16/20 10:54 AM   Specimen: Nasopharyngeal Swab  Result Value Ref Range Status   SARS Coronavirus 2 by RT PCR NEGATIVE NEGATIVE Final    Comment: (NOTE) SARS-CoV-2 target nucleic acids are NOT DETECTED.  The SARS-CoV-2 RNA is generally detectable in upper respiratoy specimens during the acute phase of infection. The lowest concentration of SARS-CoV-2 viral copies this assay can detect is 131 copies/mL. A negative result does not preclude SARS-Cov-2 infection and should not be used as the sole basis for treatment or other patient management decisions. A negative result may occur with  improper specimen collection/handling, submission of specimen other than nasopharyngeal swab, presence of viral mutation(s) within the areas targeted by this assay, and inadequate number of viral copies (<131 copies/mL). A negative result must be combined with clinical observations, patient history, and epidemiological information. The expected result is Negative.  Fact Sheet for Patients:   PinkCheek.be  Fact Sheet for Healthcare Providers:  GravelBags.it  This test is no t yet approved or cleared by the [place] FDA and  has been authorized for detection and/or diagnosis of SARS-CoV-2 by FDA under an Emergency Use Authorization (EUA). This EUA will remain  in effect (meaning this test can be used) for the duration of the COVID-19 declaration under Section 564(b)(1) of the Act, 21 U.S.C. section 360bbb-3(b)(1), unless the authorization is terminated or revoked sooner.     Influenza A by PCR NEGATIVE NEGATIVE Final   Influenza B by PCR NEGATIVE NEGATIVE Final    Comment: (NOTE) The Xpert Xpress SARS-CoV-2/FLU/RSV assay is intended as an aid in  the diagnosis of influenza from Nasopharyngeal swab specimens and  should not be used as a sole basis for treatment. Nasal washings and  aspirates are unacceptable for Xpert Xpress SARS-CoV-2/FLU/RSV  testing.  Fact Sheet for Patients: PinkCheek.be  Fact Sheet for Healthcare Providers: GravelBags.it  This test is not yet approved or cleared by the [place] FDA and  has been authorized for detection and/or diagnosis of SARS-CoV-2 by  FDA under an Emergency Use Authorization (EUA). This EUA will remain  in effect (meaning this test can be used) for the duration of the  Covid-19 declaration under Section 564(b)(1) of the Act, 21  U.S.C. section 360bbb-3(b)(1), unless the authorization is  terminated or revoked. Performed at [facility], [place] [address], [place], [place] [address]   Culture, blood (single)     Status: None   Collection Time: 05/16/20 11:04 AM   Specimen: BLOOD  Result Value Ref Range Status   Specimen Description   Final    BLOOD LEFT ANTECUBITAL Performed at [place] [address] [place], [place] [address]    Special Requests   Final     BOTTLES DRAWN AEROBIC AND ANAEROBIC Blood Culture adequate volume Performed at [place] [address] [place], [place] [address]    Culture   Final    NO GROWTH 5 DAYS Performed at [facility], [place] [address] [place], [place] [address]    Report Status 05/21/2020 FINAL  Final  Urine culture     Status: Abnormal   Collection Time: 05/16/20  5:14 PM   Specimen: Urine, Random  Result Value Ref Range Status   Specimen Description   Final    URINE, RANDOM Performed at [place] [address], [place], [place] [address]    Special Requests  Final    NONE Performed at [facility], [place] [address], [place], [place] [address]    Culture (A)  Final    >=100,000 COLONIES/mL PROTEUS MIRABILIS >=100,000 COLONIES/mL ESCHERICHIA COLI    Report Status 05/19/2020 FINAL  Final   Organism ID, Bacteria PROTEUS MIRABILIS (A)  Final   Organism ID, Bacteria ESCHERICHIA COLI (A)  Final      Susceptibility   Escherichia coli - MIC*    AMPICILLIN <=2 SENSITIVE Sensitive     CEFAZOLIN <=4 SENSITIVE Sensitive     CEFTRIAXONE <=0.25 SENSITIVE Sensitive     CIPROFLOXACIN >=4 RESISTANT Resistant     GENTAMICIN <=1 SENSITIVE Sensitive     IMIPENEM <=0.25 SENSITIVE Sensitive     NITROFURANTOIN <=16 SENSITIVE Sensitive     TRIMETH/SULFA <=20 SENSITIVE Sensitive     AMPICILLIN/SULBACTAM <=2 SENSITIVE Sensitive     PIP/TAZO <=4 SENSITIVE Sensitive     * >=100,000 COLONIES/mL ESCHERICHIA COLI   Proteus mirabilis - MIC*    AMPICILLIN <=2 SENSITIVE Sensitive     CEFAZOLIN <=4 SENSITIVE Sensitive     CEFTRIAXONE <=0.25 SENSITIVE Sensitive     CIPROFLOXACIN <=0.25 SENSITIVE Sensitive     GENTAMICIN <=1 SENSITIVE Sensitive     IMIPENEM 0.5 SENSITIVE Sensitive     NITROFURANTOIN RESISTANT Resistant     TRIMETH/SULFA <=20 SENSITIVE Sensitive     AMPICILLIN/SULBACTAM <=2 SENSITIVE Sensitive     PIP/TAZO <=4 SENSITIVE Sensitive      * >=100,000 COLONIES/mL PROTEUS MIRABILIS  Gastrointestinal Panel by PCR , Stool     Status: None   Collection Time: 05/16/20  5:14 PM   Specimen: Stool  Result Value Ref Range Status   Campylobacter species NOT DETECTED NOT DETECTED Final   Plesimonas shigelloides NOT DETECTED NOT DETECTED Final   Salmonella species NOT DETECTED NOT DETECTED Final   Yersinia enterocolitica NOT DETECTED NOT DETECTED Final   Vibrio species NOT DETECTED NOT DETECTED Final   Vibrio cholerae NOT DETECTED NOT DETECTED Final   Enteroaggregative E coli (EAEC) NOT DETECTED NOT DETECTED Final   Enteropathogenic E coli (EPEC) NOT DETECTED NOT DETECTED Final   Enterotoxigenic E coli (ETEC) NOT DETECTED NOT DETECTED Final   Shiga like toxin producing E coli (STEC) NOT DETECTED NOT DETECTED Final   Shigella/Enteroinvasive E coli (EIEC) NOT DETECTED NOT DETECTED Final   Cryptosporidium NOT DETECTED NOT DETECTED Final   Cyclospora cayetanensis NOT DETECTED NOT DETECTED Final   Entamoeba histolytica NOT DETECTED NOT DETECTED Final   Giardia lamblia NOT DETECTED NOT DETECTED Final   Adenovirus F40/41 NOT DETECTED NOT DETECTED Final   Astrovirus NOT DETECTED NOT DETECTED Final   Norovirus GI/GII NOT DETECTED NOT DETECTED Final   Rotavirus A NOT DETECTED NOT DETECTED Final   Sapovirus (I, II, IV, and V) NOT DETECTED NOT DETECTED Final    Comment: Performed at [facility], [address] [place], [place] [address]      Radiology Studies: ECHOCARDIOGRAM COMPLETE  Result Date: 05/24/2020    ECHOCARDIOGRAM REPORT   Patient Name:   SAESHA LLERENAS Date of Exam: 05/24/2020 Medical Rec #:  [id number]       Height:       63.0 in Accession #:    [id number]      Weight:       93.9 lb Date of Birth:  1980-12-29       BSA:          1.402 m Patient Age:  39 years        BP:           60/41 mmHg Patient Gender: F               HR:           102 bpm. Exam Location:  Inpatient Procedure: 2D Echo Indications:    orthostatic  hypotension  History:        Patient has no prior history of Echocardiogram examinations.  Sonographer:    Jannett Celestine RDCS (AE) Referring Phys: [phone] [facility]  Sonographer Comments: Suboptimal apical window. Image acquisition challenging due to respiratory motion and Image acquisition challenging due to patient body habitus. see comments IMPRESSIONS  1. Grossly, no wall motiion abnormalities noted. Left ventricular ejection fraction, by estimation, is 50 to 55%. The left ventricle has low normal function. Left ventricular endocardial border not optimally defined to evaluate regional wall motion. Left ventricular diastolic parameters were normal.  2. Right ventricular systolic function is normal. The right ventricular size is normal.  3. The mitral valve is grossly normal. No evidence of mitral valve regurgitation.  4. The aortic valve was not well visualized. Aortic valve regurgitation is trivial.  5. The inferior vena cava is normal in size with greater than 50% respiratory variability, suggesting right atrial pressure of 3 mmHg. Comparison(s): No prior Echocardiogram. FINDINGS  Left Ventricle: Grossly, no wall motiion abnormalities noted. Left ventricular ejection fraction, by estimation, is 50 to 55%. The left ventricle has low normal function. Left ventricular endocardial border not optimally defined to evaluate regional wall motion. The left ventricular internal cavity size was normal in size. There is no left ventricular hypertrophy. Left ventricular diastolic parameters were normal. Right Ventricle: The right ventricular size is normal. Right vetricular wall thickness was not well visualized. Right ventricular systolic function is normal. Left Atrium: Left atrial size was normal in size. Right Atrium: Right atrial size was normal in size. Pericardium: Trivial pericardial effusion is present. Mitral Valve: The mitral valve is grossly normal. No evidence of mitral valve regurgitation. Tricuspid Valve:  The tricuspid valve is grossly normal. Tricuspid valve regurgitation is not demonstrated. Aortic Valve: The aortic valve was not well visualized. Aortic valve regurgitation is trivial. Pulmonic Valve: The pulmonic valve was not well visualized. Pulmonic valve regurgitation is not visualized. Aorta: The aortic root is normal in size and structure, the aortic arch was not well visualized and the ascending aorta was not well visualized. Venous: The inferior vena cava is normal in size with greater than 50% respiratory variability, suggesting right atrial pressure of 3 mmHg. IAS/Shunts: The interatrial septum was not assessed.  LEFT VENTRICLE PLAX 2D LVIDd:         4.50 cm  Diastology LVIDs:         3.00 cm  LV e' medial:    11.50 cm/s LV PW:         0.80 cm  LV E/e' medial:  4.9 LV IVS:        0.60 cm  LV e' lateral:   14.30 cm/s LVOT diam:     1.80 cm  LV E/e' lateral: 3.9 LV SV:         30 LV SV Index:   22 LVOT Area:     2.54 cm  RIGHT VENTRICLE RV S prime:     10.30 cm/s TAPSE (M-mode): 1.5 cm LEFT ATRIUM         Index LA diam:    2.60 cm  1.85 cm/m  AORTIC VALVE LVOT Vmax:   73.10 cm/s LVOT Vmean:  59.800 cm/s LVOT VTI:    0.119 m  AORTA Ao Root diam: 2.50 cm MITRAL VALVE MV Area (PHT): 4.39 cm    SHUNTS MV Decel Time: 173 msec    Systemic VTI:  0.12 m MV E velocity: 56.10 cm/s  Systemic Diam: 1.80 cm MV A velocity: 50.10 cm/s MV E/A ratio:  1.12 Rudean Haskell MD Electronically signed by Rudean Haskell MD Signature Date/Time: 05/24/2020/12:58:34 PM    Final       Scheduled Meds: . (feeding supplement) PROSource Plus  30 mL Oral BID BM  . fludrocortisone  0.1 mg Oral Daily  . midodrine  10 mg Oral TID WC  . multivitamin with minerals  1 tablet Oral Daily  . pantoprazole  40 mg Oral BID AC  . polyethylene glycol  17 g Oral BID   Continuous Infusions: . sodium chloride 500 mL (05/25/20 1119)     LOS: 9 days      Time spent: 35 minutes   Dessa Phi, DO [facility] 05/25/2020, 12:14 PM   Available via Epic secure chat 7am-7pm After these hours, please refer to coverage provider listed on [email or web address]

## 2020-05-26 ENCOUNTER — Encounter (HOSPITAL_COMMUNITY): Payer: Self-pay | Admitting: Internal Medicine

## 2020-05-26 ENCOUNTER — Other Ambulatory Visit: Payer: Self-pay | Admitting: Hematology and Oncology

## 2020-05-26 DIAGNOSIS — E869 Volume depletion, unspecified: Secondary | ICD-10-CM | POA: Diagnosis not present

## 2020-05-26 LAB — BASIC METABOLIC PANEL
Anion gap: 6 (ref 5–15)
BUN: 18 mg/dL (ref 6–20)
CO2: 28 mmol/L (ref 22–32)
Calcium: 8.4 mg/dL — ABNORMAL LOW (ref 8.9–10.3)
Chloride: 102 mmol/L (ref 98–111)
Creatinine, Ser: <0.3 mg/dL — ABNORMAL LOW (ref 0.44–1.00)
Glucose, Bld: 97 mg/dL (ref 70–99)
Potassium: 3.8 mmol/L (ref 3.5–5.1)
Sodium: 136 mmol/L (ref 135–145)

## 2020-05-26 LAB — CBC
HCT: 28.5 % — ABNORMAL LOW (ref 36.0–46.0)
Hemoglobin: 9 g/dL — ABNORMAL LOW (ref 12.0–15.0)
MCH: 29.1 pg (ref 26.0–34.0)
MCHC: 31.6 g/dL (ref 30.0–36.0)
MCV: 92.2 fL (ref 80.0–100.0)
Platelets: 359 10*3/uL (ref 150–400)
RBC: 3.09 MIL/uL — ABNORMAL LOW (ref 3.87–5.11)
RDW: 17.4 % — ABNORMAL HIGH (ref 11.5–15.5)
WBC: 10.2 10*3/uL (ref 4.0–10.5)
nRBC: 0 % (ref 0.0–0.2)

## 2020-05-26 MED ORDER — SODIUM CHLORIDE 0.9 % IV SOLN: Freq: Once | INTRAVENOUS | Status: AC

## 2020-05-26 MED ORDER — SODIUM CHLORIDE 0.9 % IV SOLN: INTRAVENOUS | Status: DC

## 2020-05-26 MED ORDER — FLEET ENEMA 7-19 GM/118ML RE ENEM
1.0000 | ENEMA | Freq: Once | RECTAL | Status: AC
  Administered 2020-05-26: 1 via RECTAL
  Filled 2020-05-26: qty 1

## 2020-05-26 MED ORDER — POLYETHYLENE GLYCOL 3350 17 G PO PACK
17.0000 g | PACK | Freq: Three times a day (TID) | ORAL | Status: DC
  Administered 2020-05-26 – 2020-05-28 (×5): 17 g via ORAL
  Filled 2020-05-26 (×6): qty 1

## 2020-05-26 MED ORDER — SODIUM CHLORIDE 0.9 % IV SOLN
510.0000 mg | Freq: Once | INTRAVENOUS | Status: AC
  Administered 2020-05-26: 510 mg via INTRAVENOUS
  Filled 2020-05-26: qty 510

## 2020-05-26 NOTE — Plan of Care (Signed)

## 2020-05-26 NOTE — Progress Notes (Signed)
Occupational Therapy Treatment Patient Details Name: Stacy [name] MRN: [id number] DOB: 11-15-1980 Today's Date: 05/26/2020    History of present illness 39 yo female admitted with volume depletion, hypotension. Hx of anxiety, GERD, FTT, chronic intestinal pseudo obstruction.   OT comments  Patient agreeable to functional ambulation this session, BP taken in sitting EOB twice with systolic in 932T and diastolic 557 HR 82. Deferred ambulation, only transfer to recliner chair for lunch at supervision level for safety. BP in chair 126/102 (111) HR 84. Patient asymptomatic throughout. Notified RN of blood pressure readings   Follow Up Recommendations  No OT follow up;Supervision/Assistance - 24 hour    Equipment Recommendations  3 in 1 bedside commode       Precautions / Restrictions Precautions Precaution Comments: orthostatic hypotension       Mobility Bed Mobility Overal bed mobility: Modified Independent             General bed mobility comments: pt denied symptoms with supine to sit.  Transfers Overall transfer level: Needs assistance Equipment used: None Transfers: Sit to/from [facility] Sit to Stand: Supervision Stand pivot transfers: Supervision       General transfer comment: from bed to recliner, supervision for safety no physical assist to power up to standing or stabilize    Balance Overall balance assessment: Needs assistance Sitting-balance support: Feet supported Sitting balance-Leahy Scale: Good     Standing balance support: No upper extremity supported Standing balance-Leahy Scale: Fair                             ADL either performed or assessed with clinical judgement   ADL Overall ADL's : Needs assistance/impaired Eating/Feeding: Independent;Sitting                   Lower Body Dressing: Independent;Bed level Lower Body Dressing Details (indicate cue type and reason): long sitting patient able to bend  LEs to don socks in bed Toilet Transfer: Supervision/safety;Stand-pivot Toilet Transfer Details (indicate cue type and reason): to recliner, no physical assistance required          Functional mobility during ADLs: Supervision/safety General ADL Comments: session limited due to elevated blood pressure, patient reports this is 3rd day of BP med but her BP has not been this high before, notified RN               Cognition Arousal/Alertness: Awake/alert Behavior During Therapy: WFL for tasks assessed/performed Overall Cognitive Status: Within Functional Limits for tasks assessed                                                General Comments seated EOB BP taken twice with 140/103 (116) and HR 82. BP taken in chair 126/102 (111) HR 84. patient asymptomatic throughout, RN made aware     Pertinent Vitals/ Pain       Pain Assessment: No/denies pain         Frequency  Min 2X/week        Progress Toward Goals  OT Goals(current goals can now be found in the care plan section)  Progress towards OT goals: Progressing toward goals  Acute Rehab OT Goals Patient Stated Goal: to get better. "i need help" OT Goal Formulation: With patient/family Time For Goal Achievement: 06/05/20 Potential to Achieve  Goals: Good  Plan Discharge plan needs to be updated       AM-PAC OT "6 Clicks" Daily Activity     Outcome Measure   Help from another person eating meals?: None Help from another person taking care of personal grooming?: None Help from another person toileting, which includes using toliet, bedpan, or urinal?: A Little Help from another person bathing (including washing, rinsing, drying)?: A Little Help from another person to put on and taking off regular upper body clothing?: None Help from another person to put on and taking off regular lower body clothing?: A Little 6 Click Score: 21    End of Session    OT Visit Diagnosis: Unsteadiness on feet  (R26.81);Muscle weakness (generalized) (M62.81);Other (comment) (symptomatic orthostatic hypotension)   Activity Tolerance Other (comment) (limited 2* hypertensive BPs)   Patient Left in chair;with call bell/phone within reach;with family/visitor present   Nurse Communication Other (comment) (BP)        Time: 0092-3300 OT Time Calculation (min): 13 min  Charges: OT General Charges $OT Visit: 1 Visit OT Treatments $Self Care/Home Management : 8-22 mins  Delbert Phenix OT OT pager: [place] 05/26/2020, 3:17 PM

## 2020-05-26 NOTE — Progress Notes (Signed)
Physical Therapy Treatment Patient Details Name: Stacy [name] MRN: [id number] DOB: 22-Nov-1980 Today's Date: 05/26/2020    History of Present Illness 39 yo female admitted with volume depletion, hypotension. Hx of anxiety, GERD, FTT, chronic intestinal pseudo obstruction.    PT Comments    Assisted OOB to amb while monitoring BP and HR. Supine               BP 100/69  HR 85 EOB                  BP  96/69   HR 98 Standing            BP 75/53    HR 105 50 feet amb       BP 73/52   HR 118 100 feet amb     BP 70/53  HR 124 No c/o dizziness whole time "I feel fine"  No diaphoresis this session.  General Gait Details: used [id number] Rollater (trial) this session in case pt needed to sit.  Pt tolerating amb full unit with no rest break and no needed support with walker "I was just pushing it".  Currently demonstartes no need for any AD. Spouse present during session and observed.    Follow Up Recommendations        Equipment Recommendations  None recommended by PT    Recommendations for Other Services       Precautions / Restrictions Precautions Precaution Comments: orthostatic hypotension    Mobility  Bed Mobility Overal bed mobility: Modified Independent             General bed mobility comments: pt denied symptoms with supine to sit.  Transfers Overall transfer level: Needs assistance Equipment used: None Transfers: Sit to/from [facility] Sit to Stand: Supervision Stand pivot transfers: Supervision       General transfer comment: OOB to amb  Ambulation/Gait   Gait Distance (Feet): 105 Feet Assistive device: 4-wheeled walker Gait Pattern/deviations: Step-through pattern;Decreased stride length Gait velocity: decreased   General Gait Details: used 4WW Rollater (trial) this session in case pt needed to sit.  Pt tolerating amb full unit with no rest break and no needed support with walker "I was just pushing it".  Currently demonstartes no need  for any AD.   Stairs             Wheelchair Mobility    Modified Rankin (Stroke Patients Only)       Balance Overall balance assessment: Needs assistance Sitting-balance support: Feet supported Sitting balance-Leahy Scale: Good     Standing balance support: No upper extremity supported Standing balance-Leahy Scale: Fair                              Cognition Arousal/Alertness: Awake/alert Behavior During Therapy: WFL for tasks assessed/performed Overall Cognitive Status: Within Functional Limits for tasks assessed                                        Exercises      General Comments General comments (skin integrity, edema, etc.): seated EOB BP taken twice with 140/103 (116) and HR 82. BP taken in chair 126/102 (111) HR 84. patient asymptomatic throughout, RN made aware       Pertinent Vitals/Pain Pain Assessment: No/denies pain    Home Living  Prior Function            PT Goals (current goals can now be found in the care plan section) Acute Rehab PT Goals Patient Stated Goal: to get better. "i need help" Progress towards PT goals: Progressing toward goals    Frequency    Min 3X/week      PT Plan Current plan remains appropriate    Co-evaluation              AM-PAC PT "6 Clicks" Mobility   Outcome Measure  Help needed turning from your back to your side while in a flat bed without using bedrails?: None Help needed moving from lying on your back to sitting on the side of a flat bed without using bedrails?: None Help needed moving to and from a bed to a chair (including a wheelchair)?: A Little Help needed standing up from a chair using your arms (e.g., wheelchair or bedside chair)?: A Little Help needed to walk in hospital room?: A Little Help needed climbing 3-5 steps with a railing? : A Little 6 Click Score: 20    End of Session Equipment Utilized During Treatment: Gait  belt Activity Tolerance: Patient tolerated treatment well;Treatment limited secondary to medical complications (Comment) Patient left: in bed;with call bell/phone within reach;with family/visitor present Nurse Communication: Mobility status PT Visit Diagnosis: Muscle weakness (generalized) (M62.81);Difficulty in walking, not elsewhere classified (R26.2)     Time: 1115-1140 PT Time Calculation (min) (ACUTE ONLY): 25 min  Charges:  $Gait Training: 8-22 mins $Therapeutic Activity: 8-22 mins                     {Jaskarn Schweer  PTA Acute  Rehabilitation Services Pager      [phone] Office      [phone]

## 2020-05-26 NOTE — Progress Notes (Signed)
Subjective:  Denies SSCP, palpitations or Dyspnea Feels much better after fluid yesterday I take care of husbands father   Objective:  Vitals:   05/25/20 1106 05/25/20 1411 05/25/20 2026 05/26/20 0502  BP: (!) 88/52 98/60 122/86 101/75  Pulse: (!) 108 96 97 92  Resp:  20 18 16   Temp:  97.9 F (36.6 C) 98.1 F (36.7 C) 98.2 F (36.8 C)  TempSrc:  Oral Oral Oral  SpO2:  100% 100% 100%  Weight:      Height:        Intake/Output from previous day:  Intake/Output Summary (Last 24 hours) at 05/26/2020 0933 Last data filed at 05/26/2020 0859 Gross per 24 hour  Intake 2467.54 ml  Output --  Net 2467.54 ml    Physical Exam: Affect appropriate Thin malnourished female  HEENT: normal Neck supple with no adenopathy JVP normal no bruits no thyromegaly Lungs clear with no wheezing and good diaphragmatic motion Heart:  S1/S2 no murmur, no rub, gallop or click PMI normal Abdomen: benighn, BS positve, no tenderness, no AAA no bruit.  No HSM or HJR Distal pulses intact with no bruits No edema Neuro non-focal Skin warm and dry No muscular weakness   Lab Results: Basic Metabolic Panel: Recent Labs    05/26/20 0525  NA 136  K 3.8  CL 102  CO2 28  GLUCOSE 97  BUN 18  CREATININE <0.30*  CALCIUM 8.4*   Liver Function Tests: No results for input(s): AST, ALT, ALKPHOS, BILITOT, PROT, ALBUMIN in the last 72 hours. No results for input(s): LIPASE, AMYLASE in the last 72 hours. CBC: Recent Labs    05/25/20 0514 05/26/20 0525  WBC 10.9* 10.2  HGB 9.2* 9.0*  HCT 28.5* 28.5*  MCV 91.9 92.2  PLT 352 359    Imaging: ECHOCARDIOGRAM COMPLETE  Result Date: 05/24/2020    ECHOCARDIOGRAM REPORT   Patient Name:   Stacy [name] Date of Exam: 05/24/2020 Medical Rec #:  [id number]       Height:       63.0 in Accession #:    [id number]      Weight:       93.9 lb Date of Birth:  [birth year]       BSA:          1.402 m Patient Age:    39 years        BP:           60/41 mmHg  Patient Gender: F               HR:           102 bpm. Exam Location:  Inpatient Procedure: 2D Echo Indications:    orthostatic hypotension  History:        Patient has no prior history of Echocardiogram examinations.  Sonographer:    Jannett Celestine RDCS (AE) Referring Phys: [phone] [facility]  Sonographer Comments: Suboptimal apical window. Image acquisition challenging due to respiratory motion and Image acquisition challenging due to patient body habitus. see comments IMPRESSIONS  1. Grossly, no wall motiion abnormalities noted. Left ventricular ejection fraction, by estimation, is 50 to 55%. The left ventricle has low normal function. Left ventricular endocardial border not optimally defined to evaluate regional wall motion. Left ventricular diastolic parameters were normal.  2. Right ventricular systolic function is normal. The right ventricular size is normal.  3. The mitral valve is grossly normal. No evidence of mitral valve regurgitation.  4. The  aortic valve was not well visualized. Aortic valve regurgitation is trivial.  5. The inferior vena cava is normal in size with greater than 50% respiratory variability, suggesting right atrial pressure of 3 mmHg. Comparison(s): No prior Echocardiogram. FINDINGS  Left Ventricle: Grossly, no wall motiion abnormalities noted. Left ventricular ejection fraction, by estimation, is 50 to 55%. The left ventricle has low normal function. Left ventricular endocardial border not optimally defined to evaluate regional wall motion. The left ventricular internal cavity size was normal in size. There is no left ventricular hypertrophy. Left ventricular diastolic parameters were normal. Right Ventricle: The right ventricular size is normal. Right vetricular wall thickness was not well visualized. Right ventricular systolic function is normal. Left Atrium: Left atrial size was normal in size. Right Atrium: Right atrial size was normal in size. Pericardium: Trivial pericardial  effusion is present. Mitral Valve: The mitral valve is grossly normal. No evidence of mitral valve regurgitation. Tricuspid Valve: The tricuspid valve is grossly normal. Tricuspid valve regurgitation is not demonstrated. Aortic Valve: The aortic valve was not well visualized. Aortic valve regurgitation is trivial. Pulmonic Valve: The pulmonic valve was not well visualized. Pulmonic valve regurgitation is not visualized. Aorta: The aortic root is normal in size and structure, the aortic arch was not well visualized and the ascending aorta was not well visualized. Venous: The inferior vena cava is normal in size with greater than 50% respiratory variability, suggesting right atrial pressure of 3 mmHg. IAS/Shunts: The interatrial septum was not assessed.  LEFT VENTRICLE PLAX 2D LVIDd:         4.50 cm  Diastology LVIDs:         3.00 cm  LV e' medial:    11.50 cm/s LV PW:         0.80 cm  LV E/e' medial:  4.9 LV IVS:        0.60 cm  LV e' lateral:   14.30 cm/s LVOT diam:     1.80 cm  LV E/e' lateral: 3.9 LV SV:         30 LV SV Index:   22 LVOT Area:     2.54 cm  RIGHT VENTRICLE RV S prime:     10.30 cm/s TAPSE (M-mode): 1.5 cm LEFT ATRIUM         Index LA diam:    2.60 cm 1.85 cm/m  AORTIC VALVE LVOT Vmax:   73.10 cm/s LVOT Vmean:  59.800 cm/s LVOT VTI:    0.119 m  AORTA Ao Root diam: 2.50 cm MITRAL VALVE MV Area (PHT): 4.39 cm    SHUNTS MV Decel Time: 173 msec    Systemic VTI:  0.12 m MV E velocity: 56.10 cm/s  Systemic Diam: 1.80 cm MV A velocity: 50.10 cm/s MV E/A ratio:  1.12 Rudean Haskell MD Electronically signed by Rudean Haskell MD Signature Date/Time: 05/24/2020/12:58:34 PM    Final     Cardiac Studies:  ECG: ST normal    Telemetry:  NSR no arrhythmia   Echo: Low normal EF 50-55% no valve disease   Medications:    (feeding supplement) PROSource Plus  30 mL Oral BID BM   fludrocortisone  0.1 mg Oral Daily   midodrine  10 mg Oral TID WC   multivitamin with minerals  1 tablet Oral  Daily   pantoprazole  40 mg Oral BID AC   polyethylene glycol  17 g Oral BID      sodium chloride      Assessment/Plan:  1. Orthostasis:  From malnutrition. Still needs stim test to r/o Addisons Continue midodrine and florinef Will give One more liter today since she had such a good response to 2L yesterday and EF ok by echo.   Cardiology will sign off as discussed with patient these issues can be handled by primary service     Jenkins Rouge 05/26/2020, 9:33 AM

## 2020-05-26 NOTE — Progress Notes (Signed)
PROGRESS NOTE    Stacy [name]  PYK:[id number] DOB: 03/06/1981 DOA: 05/16/2020 PCP: [name], MD     Brief Narrative:  Stacy [name] is a 39 yr old female with past medical history of  anxiety, chronic intestinal pseudo-obstruction, GERD, and FTT. About 7 years ago, she began losing weight without trying. She had intermittent nausea and vomiting and frequently not being able to eat. She continues to lose weight unintentionally. She has been followed closely by dietician as outpatient.   In the ED she was found to be hypotensive with tachycardia. Temperature, respiratory rate, and O2 saturations were normal. Creatinine is 0.45. Fe 23, TIBC 85, Ferritin is 147, albumin is 1.5, LFT's are normal. Lactic Acid is 2.0 upon presentation, but came down to 1.6 with hydration. WBC is 14.1 (increased bands and neutrophils), and Hemoglobin is 8.7. This is down from 9.2 on 05/01/2020. She is negative for COVID. She was admitted for hypotension and volume depletion.  She has remained orthostatic positive. She was started on midodrine and florinef. Cardiology was consulted as well as GI due to slow trending downward of Hgb. Hematology consulted as well.   New events last 24 hours / Subjective: Awaiting repeat orthostatic VS this morning. States she did better after getting IVF yesterday. Has been asymptomatic with getting up to chair and bedside commode.   Assessment & Plan:   Principal Problem:   Volume depletion Active Problems:   Hypotension due to hypovolemia   Underweight   Iron deficiency anemia   Chronic intestinal pseudo-obstruction   GERD (gastroesophageal reflux disease)   Abdominal pain with vomiting   Hypokalemia   Severe protein-calorie malnutrition (HCC)   Failure to thrive in adult   Nausea and vomiting   Sepsis (HCC)   Hypomagnesemia   Constipation   Volume depletion, gastrointestinal loss   Orthostatic hypotension secondary to volume depletion, nausea, vomiting,  diarrhea and suspected component of adrenal insufficiency  -Unable to complete cosyntropin stim test due to starting on IV Solu-Cortef on admission -Volume depletion thought to be secondary to ongoing GI loss -Continue midodrine, increased dose 10 mg 3 times daily. Fludrocortisone 0.1mg  daily.  Repeat orthostatic vital signs in the morning -Echocardiogram without structural abnl  -Appreciate cardiology, give additional IVF today   Proteus mirabilis, E. coli UTI -Completed antibiotics  Cyclical vomiting syndrome -Appreciate GI, follow-up with them outpatient  Iron deficiency anemia -Transfused 2u pRBC 10/4 -Status post EGD 10/5 which showed normal esophagus, normal duodenum, erythematous mucosa in the stomach -IV Feraheme given -PPI -Continue to monitor CBC  -Appreciate GI  -Consulted hematology for evaluation (patient has outpatient appt 10/12 that she will likely not make)   Severe protein calorie malnutrition -Appreciate dietitian    DVT prophylaxis:  Place and maintain sequential compression device Start: 05/19/20 0943 Place TED hose Start: 05/17/20 1315  Code Status: Full code Family Communication: Spouse at bedside  Disposition Plan:  Status is: Inpatient  Remains inpatient appropriate because:Hemodynamically unstable   Dispo: The patient is from: Home              Anticipated d/c is to: Home              Anticipated d/c date is: 1 day              Patient currently is not medically stable to d/c.  Continue Midodrine, fludrocortisone. IVF today. Hematology consulted.    Antimicrobials:  Anti-infectives (From admission, onward)   Start  Dose/Rate Route Frequency Ordered Stop   05/19/20 1315  amoxicillin-clavulanate (AUGMENTIN) 875-125 MG per tablet 1 tablet        1 tablet Oral Every 12 hours 05/19/20 1219 05/24/20 0959   05/16/20 2000  piperacillin-tazobactam (ZOSYN) IVPB 3.375 g  Status:  Discontinued        3.375 g 12.5 mL/hr over 240 Minutes Intravenous  Every 8 hours 05/16/20 1851 05/19/20 1219   05/16/20 1800  piperacillin-tazobactam (ZOSYN) IVPB 3.375 g  Status:  Discontinued        3.375 g 12.5 mL/hr over 240 Minutes Intravenous Every 8 hours 05/16/20 1741 05/16/20 1851       Objective: Vitals:   05/25/20 1106 05/25/20 1411 05/25/20 2026 05/26/20 0502  BP: (!) 88/52 98/60 122/86 101/75  Pulse: (!) 108 96 97 92  Resp:  20 18 16   Temp:  97.9 F (36.6 C) 98.1 F (36.7 C) 98.2 F (36.8 C)  TempSrc:  Oral Oral Oral  SpO2:  100% 100% 100%  Weight:      Height:        Intake/Output Summary (Last 24 hours) at 05/26/2020 0954 Last data filed at 05/26/2020 0859 Gross per 24 hour  Intake 2467.54 ml  Output --  Net 2467.54 ml   Filed Weights   05/16/20 0841 05/20/20 0849  Weight: 42.6 kg 42.6 kg    Examination: General exam: Appears calm and comfortable  Cardiovascular system: Sinus tachycardia on telemetry  Central nervous system: Alert and oriented. Non focal exam. Speech clear  Extremities: Symmetric in appearance bilaterally  Psychiatry: Judgement and insight appear stable. Mood & affect appropriate.   Data Reviewed: I have personally reviewed following labs and imaging studies  CBC: Recent Labs  Lab 05/22/20 0417 05/23/20 0500 05/24/20 0556 05/25/20 0514 05/26/20 0525  WBC 15.5* 10.7* 11.7* 10.9* 10.2  HGB 10.6* 10.1* 9.7* 9.2* 9.0*  HCT 35.4* 31.6* 30.7* 28.5* 28.5*  MCV 97.8 91.1 91.6 91.9 92.2  PLT 254 313 289 352 536   Basic Metabolic Panel: Recent Labs  Lab 05/20/20 0502 05/21/20 0408 05/22/20 0417 05/26/20 0525  NA 136 137 136 136  K 4.7 3.8 3.9 3.8  CL 101 99 99 102  CO2 29 31 30 28   GLUCOSE 87 99 89 97  BUN 7 8 11 18   CREATININE 0.34* 0.38* 0.37* <0.30*  CALCIUM 8.2* 8.2* 8.5* 8.4*  MG 2.0 1.8 1.9  --   PHOS 3.5 3.4  --   --    GFR: CrCl cannot be calculated (This lab value cannot be used to calculate CrCl because it is not a number: <0.30). Liver Function Tests: Recent Labs  Lab  05/20/20 0502 05/21/20 0408  ALBUMIN 2.7* 2.5*   No results for input(s): LIPASE, AMYLASE in the last 168 hours. No results for input(s): AMMONIA in the last 168 hours. Coagulation Profile: No results for input(s): INR, PROTIME in the last 168 hours. Cardiac Enzymes: No results for input(s): CKTOTAL, CKMB, CKMBINDEX, TROPONINI in the last 168 hours. BNP (last 3 results) No results for input(s): PROBNP in the last 8760 hours. HbA1C: No results for input(s): HGBA1C in the last 72 hours. CBG: No results for input(s): GLUCAP in the last 168 hours. Lipid Profile: No results for input(s): CHOL, HDL, LDLCALC, TRIG, CHOLHDL, LDLDIRECT in the last 72 hours. Thyroid Function Tests: No results for input(s): TSH, T4TOTAL, FREET4, T3FREE, THYROIDAB in the last 72 hours. Anemia Panel: No results for input(s): VITAMINB12, FOLATE, FERRITIN, TIBC, IRON, RETICCTPCT in the  last 72 hours. Sepsis Labs: No results for input(s): PROCALCITON, LATICACIDVEN in the last 168 hours.  Recent Results (from the past 240 hour(s))  Respiratory Panel by RT PCR (Flu A&B, Covid) - Nasopharyngeal Swab     Status: None   Collection Time: 05/16/20 10:54 AM   Specimen: Nasopharyngeal Swab  Result Value Ref Range Status   SARS Coronavirus 2 by RT PCR NEGATIVE NEGATIVE Final    Comment: (NOTE) SARS-CoV-2 target nucleic acids are NOT DETECTED.  The SARS-CoV-2 RNA is generally detectable in upper respiratoy specimens during the acute phase of infection. The lowest concentration of SARS-CoV-2 viral copies this assay can detect is 131 copies/mL. A negative result does not preclude SARS-Cov-2 infection and should not be used as the sole basis for treatment or other patient management decisions. A negative result may occur with  improper specimen collection/handling, submission of specimen other than nasopharyngeal swab, presence of viral mutation(s) within the areas targeted by this assay, and inadequate number of viral  copies (<131 copies/mL). A negative result must be combined with clinical observations, patient history, and epidemiological information. The expected result is Negative.  Fact Sheet for Patients:  PinkCheek.be  Fact Sheet for Healthcare Providers:  GravelBags.it  This test is no t yet approved or cleared by the [place] FDA and  has been authorized for detection and/or diagnosis of SARS-CoV-2 by FDA under an Emergency Use Authorization (EUA). This EUA will remain  in effect (meaning this test can be used) for the duration of the COVID-19 declaration under Section 564(b)(1) of the Act, 21 U.S.C. section 360bbb-3(b)(1), unless the authorization is terminated or revoked sooner.     Influenza A by PCR NEGATIVE NEGATIVE Final   Influenza B by PCR NEGATIVE NEGATIVE Final    Comment: (NOTE) The Xpert Xpress SARS-CoV-2/FLU/RSV assay is intended as an aid in  the diagnosis of influenza from Nasopharyngeal swab specimens and  should not be used as a sole basis for treatment. Nasal washings and  aspirates are unacceptable for Xpert Xpress SARS-CoV-2/FLU/RSV  testing.  Fact Sheet for Patients: PinkCheek.be  Fact Sheet for Healthcare Providers: GravelBags.it  This test is not yet approved or cleared by the [place] FDA and  has been authorized for detection and/or diagnosis of SARS-CoV-2 by  FDA under an Emergency Use Authorization (EUA). This EUA will remain  in effect (meaning this test can be used) for the duration of the  Covid-19 declaration under Section 564(b)(1) of the Act, 21  U.S.C. section 360bbb-3(b)(1), unless the authorization is  terminated or revoked. Performed at [facility], [place] [address], [place], [place] [address]   Culture, blood (single)     Status: None   Collection Time: 05/16/20 11:04 AM   Specimen: BLOOD  Result  Value Ref Range Status   Specimen Description   Final    BLOOD LEFT ANTECUBITAL Performed at [place] [address], [place], [place] [address]    Special Requests   Final    BOTTLES DRAWN AEROBIC AND ANAEROBIC Blood Culture adequate volume Performed at [place] [address], [place], [place] [address]    Culture   Final    NO GROWTH 5 DAYS Performed at [facility], [place] [address], [place], [place] [address]    Report Status 05/21/2020 FINAL  Final  Urine culture     Status: Abnormal   Collection Time: 05/16/20  5:14 PM   Specimen: Urine, Random  Result Value  Ref Range Status   Specimen Description   Final    URINE, RANDOM Performed at [facility] [address], [place], [place] [address]    Special Requests   Final    NONE Performed at [facility], [place] [address] [place], [place] [address]    Culture (A)  Final    >=100,000 COLONIES/mL PROTEUS MIRABILIS >=100,000 COLONIES/mL ESCHERICHIA COLI    Report Status 05/19/2020 FINAL  Final   Organism ID, Bacteria PROTEUS MIRABILIS (A)  Final   Organism ID, Bacteria ESCHERICHIA COLI (A)  Final      Susceptibility   Escherichia coli - MIC*    AMPICILLIN <=2 SENSITIVE Sensitive     CEFAZOLIN <=4 SENSITIVE Sensitive     CEFTRIAXONE <=0.25 SENSITIVE Sensitive     CIPROFLOXACIN >=4 RESISTANT Resistant     GENTAMICIN <=1 SENSITIVE Sensitive     IMIPENEM <=0.25 SENSITIVE Sensitive     NITROFURANTOIN <=16 SENSITIVE Sensitive     TRIMETH/SULFA <=20 SENSITIVE Sensitive     AMPICILLIN/SULBACTAM <=2 SENSITIVE Sensitive     PIP/TAZO <=4 SENSITIVE Sensitive     * >=100,000 COLONIES/mL ESCHERICHIA COLI   Proteus mirabilis - MIC*    AMPICILLIN <=2 SENSITIVE Sensitive     CEFAZOLIN <=4 SENSITIVE Sensitive     CEFTRIAXONE <=0.25 SENSITIVE Sensitive     CIPROFLOXACIN <=0.25 SENSITIVE Sensitive     GENTAMICIN <=1 SENSITIVE Sensitive      IMIPENEM 0.5 SENSITIVE Sensitive     NITROFURANTOIN RESISTANT Resistant     TRIMETH/SULFA <=20 SENSITIVE Sensitive     AMPICILLIN/SULBACTAM <=2 SENSITIVE Sensitive     PIP/TAZO <=4 SENSITIVE Sensitive     * >=100,000 COLONIES/mL PROTEUS MIRABILIS  Gastrointestinal Panel by PCR , Stool     Status: None   Collection Time: 05/16/20  5:14 PM   Specimen: Stool  Result Value Ref Range Status   Campylobacter species NOT DETECTED NOT DETECTED Final   Plesimonas shigelloides NOT DETECTED NOT DETECTED Final   Salmonella species NOT DETECTED NOT DETECTED Final   Yersinia enterocolitica NOT DETECTED NOT DETECTED Final   Vibrio species NOT DETECTED NOT DETECTED Final   Vibrio cholerae NOT DETECTED NOT DETECTED Final   Enteroaggregative E coli (EAEC) NOT DETECTED NOT DETECTED Final   Enteropathogenic E coli (EPEC) NOT DETECTED NOT DETECTED Final   Enterotoxigenic E coli (ETEC) NOT DETECTED NOT DETECTED Final   Shiga like toxin producing E coli (STEC) NOT DETECTED NOT DETECTED Final   Shigella/Enteroinvasive E coli (EIEC) NOT DETECTED NOT DETECTED Final   Cryptosporidium NOT DETECTED NOT DETECTED Final   Cyclospora cayetanensis NOT DETECTED NOT DETECTED Final   Entamoeba histolytica NOT DETECTED NOT DETECTED Final   Giardia lamblia NOT DETECTED NOT DETECTED Final   Adenovirus F40/41 NOT DETECTED NOT DETECTED Final   Astrovirus NOT DETECTED NOT DETECTED Final   Norovirus GI/GII NOT DETECTED NOT DETECTED Final   Rotavirus A NOT DETECTED NOT DETECTED Final   Sapovirus (I, II, IV, and V) NOT DETECTED NOT DETECTED Final    Comment: Performed at [facility], [address] [place], [place] [address]      Radiology Studies: ECHOCARDIOGRAM COMPLETE  Result Date: 05/24/2020    ECHOCARDIOGRAM REPORT   Patient Name:   Stacy [name] Date of Exam: 05/24/2020 Medical Rec #:  [id number]       Height:       63.0 in Accession #:    [id number]      Weight:  93.9 lb Date of Birth:  [birth year]        BSA:          1.402 m Patient Age:    39 years        BP:           60/41 mmHg Patient Gender: F               HR:           102 bpm. Exam Location:  Inpatient Procedure: 2D Echo Indications:    orthostatic hypotension  History:        Patient has no prior history of Echocardiogram examinations.  Sonographer:    Jannett Celestine RDCS (AE) Referring Phys: [phone] [facility]  Sonographer Comments: Suboptimal apical window. Image acquisition challenging due to respiratory motion and Image acquisition challenging due to patient body habitus. see comments IMPRESSIONS  1. Grossly, no wall motiion abnormalities noted. Left ventricular ejection fraction, by estimation, is 50 to 55%. The left ventricle has low normal function. Left ventricular endocardial border not optimally defined to evaluate regional wall motion. Left ventricular diastolic parameters were normal.  2. Right ventricular systolic function is normal. The right ventricular size is normal.  3. The mitral valve is grossly normal. No evidence of mitral valve regurgitation.  4. The aortic valve was not well visualized. Aortic valve regurgitation is trivial.  5. The inferior vena cava is normal in size with greater than 50% respiratory variability, suggesting right atrial pressure of 3 mmHg. Comparison(s): No prior Echocardiogram. FINDINGS  Left Ventricle: Grossly, no wall motiion abnormalities noted. Left ventricular ejection fraction, by estimation, is 50 to 55%. The left ventricle has low normal function. Left ventricular endocardial border not optimally defined to evaluate regional wall motion. The left ventricular internal cavity size was normal in size. There is no left ventricular hypertrophy. Left ventricular diastolic parameters were normal. Right Ventricle: The right ventricular size is normal. Right vetricular wall thickness was not well visualized. Right ventricular systolic function is normal. Left Atrium: Left atrial size was normal in size. Right  Atrium: Right atrial size was normal in size. Pericardium: Trivial pericardial effusion is present. Mitral Valve: The mitral valve is grossly normal. No evidence of mitral valve regurgitation. Tricuspid Valve: The tricuspid valve is grossly normal. Tricuspid valve regurgitation is not demonstrated. Aortic Valve: The aortic valve was not well visualized. Aortic valve regurgitation is trivial. Pulmonic Valve: The pulmonic valve was not well visualized. Pulmonic valve regurgitation is not visualized. Aorta: The aortic root is normal in size and structure, the aortic arch was not well visualized and the ascending aorta was not well visualized. Venous: The inferior vena cava is normal in size with greater than 50% respiratory variability, suggesting right atrial pressure of 3 mmHg. IAS/Shunts: The interatrial septum was not assessed.  LEFT VENTRICLE PLAX 2D LVIDd:         4.50 cm  Diastology LVIDs:         3.00 cm  LV e' medial:    11.50 cm/s LV PW:         0.80 cm  LV E/e' medial:  4.9 LV IVS:        0.60 cm  LV e' lateral:   14.30 cm/s LVOT diam:     1.80 cm  LV E/e' lateral: 3.9 LV SV:         30 LV SV Index:   22 LVOT Area:     2.54 cm  RIGHT VENTRICLE RV  S prime:     10.30 cm/s TAPSE (M-mode): 1.5 cm LEFT ATRIUM         Index LA diam:    2.60 cm 1.85 cm/m  AORTIC VALVE LVOT Vmax:   73.10 cm/s LVOT Vmean:  59.800 cm/s LVOT VTI:    0.119 m  AORTA Ao Root diam: 2.50 cm MITRAL VALVE MV Area (PHT): 4.39 cm    SHUNTS MV Decel Time: 173 msec    Systemic VTI:  0.12 m MV E velocity: 56.10 cm/s  Systemic Diam: 1.80 cm MV A velocity: 50.10 cm/s MV E/A ratio:  1.12 Rudean Haskell MD Electronically signed by Rudean Haskell MD Signature Date/Time: 05/24/2020/12:58:34 PM    Final       Scheduled Meds: . (feeding supplement) PROSource Plus  30 mL Oral BID BM  . fludrocortisone  0.1 mg Oral Daily  . midodrine  10 mg Oral TID WC  . multivitamin with minerals  1 tablet Oral Daily  . pantoprazole  40 mg Oral BID  AC  . polyethylene glycol  17 g Oral BID   Continuous Infusions: . sodium chloride    . sodium chloride       LOS: 10 days      Time spent: 25 minutes   Dessa Phi, DO [facility] 05/26/2020, 9:54 AM   Available via Epic secure chat 7am-7pm After these hours, please refer to coverage provider listed on [email or web address]

## 2020-05-26 NOTE — Consult Note (Addendum)
[facility]  [phone] [phone]   I have seen her, examined her and agree with the documentation as follows  INITIAL HEMATOLOGY CONSULTATION  Referring MD: Dr. Dessa Phi  Reason for Referral: Anemia  HPI: Ms.Smisek is a 39 year old female with a past medical history significant for anxiety, chronic intestinal pseudoobstruction, GERD, failure to thrive.  The patient was admitted to the hospital with dehydration, nausea, vomiting, abdominal pain.  Labs on admission showed a WBC of 14.1 hemoglobin 8.7, normal platelet count 334,000, total protein 4.0, albumin 1.5.  Additional labs performed on 10/1 showed a reticulocyte count percentage of 3.6%, absolute reticulocyte count of 92.1, immature reticulocyte fraction of 24%, vitamin B12 level was 514, folate 6.0, ferritin 147, iron 23, TIBC 85, percent saturation 27%.  Stool for occult blood was checked this admission on 10/4 and was positive.  The patient has received 2 units PRBCs this admission and received a dose of Feraheme 510 mg IV on 05/17/2020. The patient was seen by gastroenterology and underwent upper endoscopy on 10/5 which showed a normal esophagus, diffuse moderately erythematous mucosa without bleeding found in the entire stomach which was biopsied, examined duodenum was normal.  Biopsy of stomach antrum showed mild reactive gastropathy, negative H. pylori, negative for malignancy and biopsy of the stomach body showed reactive gastropathy and no malignancy.  Review of the patient's prior CBC results available to me in epic show that she has been persistently anemic since August 2020.  Prior to that, she had intermittent anemia dating back to 2013.  The patient's husband is at the bedside at the time my visit and assists in providing the history. The patient reports that she is feeling much better today. She is hoping to go home in the next 1 to 2 days. The patient reports that she still has some  intermittent dizziness with standing. She reports that her appetite is much better now, but she was having difficulty eating prior to admission secondary to bloating, nausea, vomiting, diarrhea. She reports a gradual weight loss of approximately 40 pounds over the past 7 years. She currently denies headaches, chest pain, shortness of breath. Reports that her abdominal tension, nausea, vomiting have improved. Bowels are moving but overall states that she tends to feel constipated. She has not noticed any bleeding such as epistaxis, hemoptysis, hematemesis, hematuria, hematochezia. She takes oral iron and states that stools are black normally. She reports that she is on vitamin B12 injections monthly at her primary care provider's office. She has never received IV iron prior to this admission but has been taking oral iron twice a day, which she tolerated poorly due to severe constipation. Has only received 1 blood transfusion previously which was during her hospitalization in September 2021. The patient is married and has 2 children. Denies history of alcohol tobacco use. Hematology was asked see the patient to make recommendations regarding her anemia.  She never donated blood.  Up until recently, she denies the need for blood transfusion She has not had any menstruation for many months due to her malnourished state The patient denies any recent signs or symptoms of bleeding such as spontaneous epistaxis, hematuria or hematochezia. She has noticed some passage of dark stool in the past The patient take regular NSAID She says she takes Motrin for chronic abdominal pain  Past Medical History:  Diagnosis Date   Anxiety    Benign liver cyst    Chronic intestinal pseudo-obstruction    GERD (gastroesophageal reflux disease)  History of UTI   :    Past Surgical History:  Procedure Laterality Date   BIOPSY  05/20/2020   Procedure: BIOPSY;  Surgeon: Wonda Horner, MD;  Location: [facility];   Service: Endoscopy;;   CESAREAN SECTION     twice 2010, 2013   ESOPHAGOGASTRODUODENOSCOPY (EGD) WITH PROPOFOL N/A 05/20/2020   Procedure: ESOPHAGOGASTRODUODENOSCOPY (EGD) WITH PROPOFOL;  Surgeon: Wonda Horner, MD;  Location: [facility];  Service: Endoscopy;  Laterality: N/A;   Esure  ~2015  :   CURRENT MEDS: Current Facility-Administered Medications  Medication Dose Route Frequency Provider Last Rate Last Admin   (feeding supplement) PROSource Plus liquid 30 mL  30 mL Oral BID BM Dessa Phi, DO   30 mL at 05/25/20 2021   0.9 %  sodium chloride infusion   Intravenous Once Josue Hector, MD       0.9 %  sodium chloride infusion   Intravenous Continuous Lenis Noon, RPH       acetaminophen (TYLENOL) tablet 650 mg  650 mg Oral Q6H PRN Wonda Horner, MD   650 mg at 05/19/20 2033   Or   acetaminophen (TYLENOL) suppository 650 mg  650 mg Rectal Q6H PRN Wonda Horner, MD       fludrocortisone (FLORINEF) tablet 0.1 mg  0.1 mg Oral Daily Dessa Phi, DO   0.1 mg at 05/25/20 0905   melatonin tablet 3 mg  3 mg Oral QHS PRN Wonda Horner, MD   3 mg at 05/25/20 2117   metoCLOPramide (REGLAN) injection 10 mg  10 mg Intravenous Q8H PRN Anson Fret F, MD   10 mg at 05/16/20 2325   midodrine (PROAMATINE) tablet 10 mg  10 mg Oral TID WC Dessa Phi, DO   10 mg at 05/25/20 1806   multivitamin with minerals tablet 1 tablet  1 tablet Oral Daily Wonda Horner, MD   1 tablet at 05/25/20 0905   ondansetron (ZOFRAN) tablet 4 mg  4 mg Oral Q6H PRN Wonda Horner, MD   4 mg at 05/19/20 2033   Or   ondansetron (ZOFRAN) injection 4 mg  4 mg Intravenous Q6H PRN Wonda Horner, MD   4 mg at 05/24/20 2145   pantoprazole (PROTONIX) EC tablet 40 mg  40 mg Oral BID AC Eugenie Filler, MD   40 mg at 05/25/20 1806   polyethylene glycol (MIRALAX / GLYCOLAX) packet 17 g  17 g Oral BID Ronnette Juniper, MD   17 g at 05/25/20 2118      Allergies  Allergen Reactions   Tetracyclines & Related Swelling     Swelling in spine; required spinal tap.    Erythromycin Other (See Comments)    Swelling in spine; required spinal tap   Raspberry Rash   Sulfonamide Derivatives Rash    Reaction to cream   :  Family History  Problem Relation Age of Onset   CAD Father    Hypertension Father    Diabetes Father    Diabetes Brother    Colon cancer Neg Hx    Breast cancer Neg Hx    :  Social History   Socioeconomic History   Marital status: Married    Spouse name: Not on file   Number of children: 2   Years of education: Not on file   Highest education level: Not on file  Occupational History   Occupation: stay home ; associate degree paralega   Tobacco Use  Smoking status: Never Smoker   Smokeless tobacco: Never Used  Vaping Use   Vaping Use: Never used  Substance and Sexual Activity   Alcohol use: No    Comment: socially    Drug use: No   Sexual activity: Yes    Birth control/protection: None  Other Topics Concern   Not on file  Social History Narrative   Household: pt, husband , 2 children   2 boys: 2010, 2013   P2G2   Original  from [place], no foreign trips   Social Determinants of Radio broadcast assistant Strain:    Difficulty of Paying Living Expenses: Not on file  Food Insecurity:    Worried About Charity fundraiser in the Last Year: Not on file   [employer] of Food in the Last Year: Not on file  Transportation Needs:    Film/video editor (Medical): Not on file   Lack of Transportation (Non-Medical): Not on file  Physical Activity:    Days of Exercise per Week: Not on file   Minutes of Exercise per Session: Not on file  Stress:    Feeling of Stress : Not on file  Social Connections:    Frequency of Communication with Friends and Family: Not on file   Frequency of Social Gatherings with Friends and Family: Not on file   Attends Religious Services: Not on file   Active Member of Clubs or Organizations: Not on file   Attends Archivist Meetings:  Not on file   Marital Status: Not on file  Intimate Partner Violence:    Fear of Current or Ex-Partner: Not on file   Emotionally Abused: Not on file   Physically Abused: Not on file   Sexually Abused: Not on file  :  REVIEW OF SYSTEMS:  A comprehensive 14 point review of systems was negative except as noted in the HPI.    Exam: Patient Vitals for the past 24 hrs:  BP Temp Temp src Pulse Resp SpO2  05/26/20 0502 101/75 98.2 F (36.8 C) Oral 92 16 100 %  05/25/20 2026 122/86 98.1 F (36.7 C) Oral 97 18 100 %  05/25/20 1411 98/60 97.9 F (36.6 C) Oral 96 20 100 %  05/25/20 1106 (!) 88/52 -- -- (!) 108 -- --  05/25/20 1101 (!) 55/37 -- -- (!) 131 -- --  05/25/20 1059 (!) 80/53 -- -- (!) 111 -- --  05/25/20 1057 92/66 -- -- (!) 102 -- --    General: Cachectic, no distress Eyes:  no scleral icterus.   ENT:  There were no oropharyngeal lesions.    Lymphatics:  Negative cervical, supraclavicular or axillary adenopathy.   Respiratory: lungs were clear bilaterally without wheezing or crackles.   Cardiovascular:  Regular rate and rhythm, S1/S2, without murmur, rub or gallop.  There was no pedal edema.   GI:  abdomen was soft, flat, nontender, nondistended, without organomegaly.   Musculoskeletal: Strength symmetrical in the upper and lower extremities. Skin exam was without ecchymosis, petechiae.   Neuro exam was nonfocal.  Patient was alert and oriented.  Attention was good.   Language was appropriate.  Mood was normal without depression.  Speech was not pressured.  Thought content was not tangential.    LABS:  Lab Results  Component Value Date   WBC 10.2 05/26/2020   HGB 9.0 (L) 05/26/2020   HCT 28.5 (L) 05/26/2020   PLT 359 05/26/2020   GLUCOSE 97 05/26/2020   CHOL 123 01/01/2020  TRIG 60.0 01/01/2020   HDL 50.40 01/01/2020   LDLCALC 61 01/01/2020   ALT 9 05/18/2020   AST 10 (L) 05/18/2020   NA 136 05/26/2020   K 3.8 05/26/2020   CL 102 05/26/2020   CREATININE <0.30  (L) 05/26/2020   BUN 18 05/26/2020   CO2 28 05/26/2020   INR 1.4 (H) 04/28/2020   HGBA1C 5.1 07/28/2013    CT ABDOMEN PELVIS W CONTRAST  Result Date: 04/26/2020 CLINICAL DATA:  Abdominal pain.  Bowel obstruction suspected EXAM: CT ABDOMEN AND PELVIS WITH CONTRAST TECHNIQUE: Multidetector CT imaging of the abdomen and pelvis was performed using the standard protocol following bolus administration of intravenous contrast. CONTRAST:  43mL OMNIPAQUE IOHEXOL 300 MG/ML  SOLN COMPARISON:  04/25/2019 FINDINGS: Lower chest: There are trace bilateral pleural effusions. The heart size is unremarkable. There is atelectasis at the left lung base. Hepatobiliary: Again noted is intrahepatic and extrahepatic biliary ductal dilatation which is similar in appearance to prior study. Multiple liver masses are noted, specifically within the right hepatic lobe and the caudate lobe. The gallbladder is contracted and therefore poorly evaluated.There is no biliary ductal dilation. Pancreas: Normal contours without ductal dilatation. No peripancreatic fluid collection. Spleen: Unremarkable. Adrenals/Urinary Tract: --Adrenal glands: Unremarkable. --Right kidney/ureter: No hydronephrosis or radiopaque kidney stones. --Left kidney/ureter: No hydronephrosis or radiopaque kidney stones. --Urinary bladder: Unremarkable. Stomach/Bowel: --Stomach/Duodenum: There is an air-fluid level in the stomach. --Small bowel: The there is diffuse mild dilatation of virtually all of the small bowel loops. Many of the small bowel loops contain air-fluid levels there is relative decompression of the terminal ileum. --Colon: There is mild diffuse wall thickening of the colon with apparent loss of haustral. --Appendix: Normal. Vascular/Lymphatic: Normal course and caliber of the major abdominal vessels. --No retroperitoneal lymphadenopathy. --there are mildly enlarged mesenteric lymph nodes, especially in the right lower quadrant. --No pelvic or inguinal  lymphadenopathy. Reproductive: Bilateral Essure devices are noted. Other: No ascites or free air. The abdominal wall is normal. Musculoskeletal. No acute displaced fractures. IMPRESSION: 1. Diffuse mild dilatation of virtually all of the small bowel with associated air-fluid levels. This appearance is very similar to the patient's CT dated 04/25/2019. There is no clear transition point. Additionally, there is mild diffuse wall thickening of the colon. Findings may be secondary to infectious or inflammatory enterocolitis. 2. Essentially unchanged intrahepatic biliary ductal dilatation. Correlation with laboratory studies is recommended. 3. Trace bilateral pleural effusions. Electronically Signed   By: Constance Holster M.D.   On: 04/26/2020 20:49   DG Abd 2 Views  Result Date: 05/16/2020 CLINICAL DATA:  Diarrhea and hypotension EXAM: X-RAY ABDOMEN 2 VIEWS COMPARISON:  04/26/2020 FINDINGS: Lung bases are clear. No free air beneath the diaphragm. 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 Mild increased small bowel gas, decreased distension compared with radiograph from 04/26/2020. Scattered colon gas is present. No radiopaque calculi. IMPRESSION: Mild increased small bowel gas without definitive obstructive pattern. Findings could be secondary to ileus or enteritis; degree of small bowel distension is improved since comparison radiograph from 04/26/2020. Electronically Signed   By: Donavan Foil M.D.   On: 05/16/2020 17:27   DG Abdomen Acute W/Chest  Result Date: 04/26/2020 CLINICAL DATA:  One-week history of nausea, diarrhea and anorexia. Negative COVID-19 tests recently. EXAM: ACUTE ABDOMEN SERIES (ABDOMEN TWO VIEWS AND CHEST ONE VIEW) COMPARISON:  Acute abdomen series 04/22/2019 and CT abdomen and pelvis 04/25/2019. FINDINGS: Gaseous distention of numerous loops of small bowel throughout the abdomen and pelvis demonstrating air-fluid levels  on the [employer]. Gas and liquid stool in normal caliber  colon. No evidence of free intraperitoneal air on the [employer]. Essure devices in the fallopian tubes in the pelvis. No visible opaque urinary tract calculi. Regional skeleton unremarkable. Cardiomediastinal silhouette unremarkable and unchanged. Lungs clear. Bronchovascular markings normal. Pulmonary vascularity normal. No visible pleural effusions. No pneumothorax. IMPRESSION: 1. Partial small bowel obstruction is favored over small bowel ileus. 2. No free intraperitoneal air. 3. No acute cardiopulmonary disease. Electronically Signed   By: Evangeline Dakin M.D.   On: 04/26/2020 20:11   ECHOCARDIOGRAM COMPLETE  Result Date: 05/24/2020    ECHOCARDIOGRAM REPORT   Patient Name:   LEILANNI HALVORSON Date of Exam: 05/24/2020 Medical Rec #:  [id number]       Height:       63.0 in Accession #:    [id number]      Weight:       93.9 lb Date of Birth:  12/24/1980       BSA:          1.402 m Patient Age:    39 years        BP:           60/41 mmHg Patient Gender: F               HR:           102 bpm. Exam Location:  Inpatient Procedure: 2D Echo Indications:    orthostatic hypotension  History:        Patient has no prior history of Echocardiogram examinations.  Sonographer:    Jannett Celestine RDCS (AE) Referring Phys: [phone] [facility]  Sonographer Comments: Suboptimal apical window. Image acquisition challenging due to respiratory motion and Image acquisition challenging due to patient body habitus. see comments IMPRESSIONS  1. Grossly, no wall motiion abnormalities noted. Left ventricular ejection fraction, by estimation, is 50 to 55%. The left ventricle has low normal function. Left ventricular endocardial border not optimally defined to evaluate regional wall motion. Left ventricular diastolic parameters were normal.  2. Right ventricular systolic function is normal. The right ventricular size is normal.  3. The mitral valve is grossly normal. No evidence of mitral valve regurgitation.  4. The aortic valve was not  well visualized. Aortic valve regurgitation is trivial.  5. The inferior vena cava is normal in size with greater than 50% respiratory variability, suggesting right atrial pressure of 3 mmHg. Comparison(s): No prior Echocardiogram. FINDINGS  Left Ventricle: Grossly, no wall motiion abnormalities noted. Left ventricular ejection fraction, by estimation, is 50 to 55%. The left ventricle has low normal function. Left ventricular endocardial border not optimally defined to evaluate regional wall motion. The left ventricular internal cavity size was normal in size. There is no left ventricular hypertrophy. Left ventricular diastolic parameters were normal. Right Ventricle: The right ventricular size is normal. Right vetricular wall thickness was not well visualized. Right ventricular systolic function is normal. Left Atrium: Left atrial size was normal in size. Right Atrium: Right atrial size was normal in size. Pericardium: Trivial pericardial effusion is present. Mitral Valve: The mitral valve is grossly normal. No evidence of mitral valve regurgitation. Tricuspid Valve: The tricuspid valve is grossly normal. Tricuspid valve regurgitation is not demonstrated. Aortic Valve: The aortic valve was not well visualized. Aortic valve regurgitation is trivial. Pulmonic Valve: The pulmonic valve was not well visualized. Pulmonic valve regurgitation is not visualized. Aorta: The aortic root is normal in size and structure, the aortic  arch was not well visualized and the ascending aorta was not well visualized. Venous: The inferior vena cava is normal in size with greater than 50% respiratory variability, suggesting right atrial pressure of 3 mmHg. IAS/Shunts: The interatrial septum was not assessed.  LEFT VENTRICLE PLAX 2D LVIDd:         4.50 cm  Diastology LVIDs:         3.00 cm  LV e' medial:    11.50 cm/s LV PW:         0.80 cm  LV E/e' medial:  4.9 LV IVS:        0.60 cm  LV e' lateral:   14.30 cm/s LVOT diam:     1.80 cm   LV E/e' lateral: 3.9 LV SV:         30 LV SV Index:   22 LVOT Area:     2.54 cm  RIGHT VENTRICLE RV S prime:     10.30 cm/s TAPSE (M-mode): 1.5 cm LEFT ATRIUM         Index LA diam:    2.60 cm 1.85 cm/m  AORTIC VALVE LVOT Vmax:   73.10 cm/s LVOT Vmean:  59.800 cm/s LVOT VTI:    0.119 m  AORTA Ao Root diam: 2.50 cm MITRAL VALVE MV Area (PHT): 4.39 cm    SHUNTS MV Decel Time: 173 msec    Systemic VTI:  0.12 m MV E velocity: 56.10 cm/s  Systemic Diam: 1.80 cm MV A velocity: 50.10 cm/s MV E/A ratio:  1.12 Rudean Haskell MD Electronically signed by Rudean Haskell MD Signature Date/Time: 05/24/2020/12:58:34 PM    Final      ASSESSMENT AND PLAN:   Normocytic anemia This is multifactorial It is difficult to assess due to recent transfusion but overall, I believe she has component of anemia of chronic illness as well as iron deficiency Recent EGD showed gastritis The patient confirmed that she has taken chronic NSAID in the past She tolerated oral iron supplement poorly Status post 2 units PRBCs on 10/4 and received a dose of Feraheme 510 mg IV on 10/2 GI work-up completed and no evidence of malignancy or bleeding No evidence of hemolysis based on reticulocytes and T bili The most likely cause of her anemia is due to chronic blood loss/malabsorption syndrome. We discussed some of the risks, benefits, and alternatives of intravenous iron infusions. The patient is symptomatic from anemia and the iron level is critically low. She tolerated oral iron supplement poorly and desires to achieved higher levels of iron faster for adequate hematopoesis. Some of the side-effects to be expected including risks of infusion reactions, phlebitis, headaches, nausea and fatigue.  The patient is willing to proceed. Patient education material was dispensed.  Goal is to keep ferritin level greater than 50 and resolution of anemia I will order another dose of IV iron today and plan to see her back in my office in 2  weeks for recheck blood work  History of vitamin B12 deficiency Continue vitamin B12 injections  Orthostatic hypotension secondary to volume depletion, nausea, vomiting, diarrhea and suspected component of adrenal insufficiency Cardiology following and recommends stim test to rule out Addison's On midodrine and Florinef and IV fluids  Severe protein calorie malnutrition Due to underlying GI condition causing nausea, vomiting, diarrhea Dietitian following  Discharge planning Would defer to primary service I will set up outpatient appointment in 2 weeks I have addressed all her questions and concerns  Thank you for this referral.  Mikey Bussing, DNP, AGPCNP-BC, AOCNP Mon/Tues/Thurs/Fri 7am-5pm; Off Wednesdays Cell: [phone]  Heath Lark, MD

## 2020-05-26 NOTE — Progress Notes (Addendum)
[facility] Gastroenterology Progress Note  Stacy [name] 39 y.o. 02/09/1981  CC:  Constipation  Subjective: Patient reports constipation during hospitalization.  Takes Motegrity 1mg  at home with good control of symptoms. However, she was not taking it while admitted, and it was restarted yesterday.  Denies any abdominal pain, nausea, or vomiting.  ROS : Review of Systems  Respiratory: Negative for cough and shortness of breath.   Gastrointestinal: Positive for constipation. Negative for abdominal pain, blood in stool, diarrhea, heartburn, melena, nausea and vomiting.   Objective: Vital signs in last 24 hours: Vitals:   05/25/20 2026 05/26/20 0502  BP: 122/86 101/75  Pulse: 97 92  Resp: 18 16  Temp: 98.1 F (36.7 C) 98.2 F (36.8 C)  SpO2: 100% 100%    Physical Exam:  General:  Cachectic, alert, oriented, cooperative, no distress  Head:  Normocephalic, without obvious abnormality, atraumatic  Eyes:  Anicteric sclera, EOMs intact  Lungs:   Clear to auscultation bilaterally, respirations unlabored  Heart:  Regular rate and rhythm, S1, S2 normal  Abdomen:   Soft and non-tender with mild distention, bowel sounds active all four quadrants, no guarding or peritoneal signs  Extremities: Extremities normal, atraumatic, no  edema  Pulses: 2+ and symmetric    Lab Results: Recent Labs    05/26/20 0525  NA 136  K 3.8  CL 102  CO2 28  GLUCOSE 97  BUN 18  CREATININE <0.30*  CALCIUM 8.4*   No results for input(s): AST, ALT, ALKPHOS, BILITOT, PROT, ALBUMIN in the last 72 hours. Recent Labs    05/25/20 0514 05/26/20 0525  WBC 10.9* 10.2  HGB 9.2* 9.0*  HCT 28.5* 28.5*  MCV 91.9 92.2  PLT 352 359   No results for input(s): LABPROT, INR in the last 72 hours.     Assessment: History of chronic intestinal pseudoobstruction, small bowel dysmotility, cyclical nausea and vomiting, chronic constipation  Hypotension, mostly orthostatic and tachycardia: Has been evaluated by  cardiology, who have recommended increasing sodium intake to 4 g a day and for her to drink at least 3 to 4 L a day while continuing midodrine and fludrocortisone  Gradually decreasing Hgb over the last several days.  No frank melena or hematochezia.  Hgb 9.0 today as (10.6 four days ago)  Plan: One fleet enema.  Increase MiraLAX to TID dosing.  Continue Motegrity 1 mg daily, may take 48 hours to see improvement.    Continue pantoprazole 40 mg twice daily.  Continue supportive care.   [facility] will follow.  Salley Slaughter PA-C 05/26/2020, 11:00 AM  Contact #  [phone]

## 2020-05-27 ENCOUNTER — Inpatient Hospital Stay: Payer: 59 | Admitting: Hematology and Oncology

## 2020-05-27 ENCOUNTER — Telehealth: Payer: Self-pay | Admitting: Hematology and Oncology

## 2020-05-27 DIAGNOSIS — E869 Volume depletion, unspecified: Secondary | ICD-10-CM | POA: Diagnosis not present

## 2020-05-27 LAB — CBC
HCT: 29.9 % — ABNORMAL LOW (ref 36.0–46.0)
Hemoglobin: 9.3 g/dL — ABNORMAL LOW (ref 12.0–15.0)
MCH: 29.2 pg (ref 26.0–34.0)
MCHC: 31.1 g/dL (ref 30.0–36.0)
MCV: 94 fL (ref 80.0–100.0)
Platelets: 397 10*3/uL (ref 150–400)
RBC: 3.18 MIL/uL — ABNORMAL LOW (ref 3.87–5.11)
RDW: 17.2 % — ABNORMAL HIGH (ref 11.5–15.5)
WBC: 8.4 10*3/uL (ref 4.0–10.5)
nRBC: 0 % (ref 0.0–0.2)

## 2020-05-27 MED ORDER — SODIUM CHLORIDE 0.9 % IV BOLUS
1000.0000 mL | Freq: Once | INTRAVENOUS | Status: AC
  Administered 2020-05-27: 1000 mL via INTRAVENOUS

## 2020-05-27 MED ORDER — FLEET ENEMA 7-19 GM/118ML RE ENEM
1.0000 | ENEMA | Freq: Once | RECTAL | Status: AC
  Administered 2020-05-27: 1 via RECTAL
  Filled 2020-05-27: qty 1

## 2020-05-27 NOTE — Progress Notes (Signed)
PROGRESS NOTE    Stacy [name]  MGQ:[id number] DOB: [birth year] DOA: 05/16/2020 PCP: [name], MD     Brief Narrative:  Stacy [name] is a 39 yr old female with past medical history of  anxiety, chronic intestinal pseudo-obstruction, GERD, and FTT. About 7 years ago, she began losing weight without trying. She had intermittent nausea and vomiting and frequently not being able to eat. She continues to lose weight unintentionally. She has been followed closely by dietician as outpatient.   In the ED she was found to be hypotensive with tachycardia. Temperature, respiratory rate, and O2 saturations were normal. Creatinine is 0.45. Fe 23, TIBC 85, Ferritin is 147, albumin is 1.5, LFT's are normal. Lactic Acid is 2.0 upon presentation, but came down to 1.6 with hydration. WBC is 14.1 (increased bands and neutrophils), and Hemoglobin is 8.7. This is down from 9.2 on 05/01/2020. She is negative for COVID. She was admitted for hypotension and volume depletion.  She has remained orthostatic positive. She was started on midodrine and florinef. Cardiology was consulted as well as GI due to slow trending downward of Hgb. Hematology consulted as well.   New events last 24 hours / Subjective: Feeling well, no BM yet. Wants to go home.   After my evaluation, RN checked her orthostatic VS and it remains positive 87/55 --> 64/44. She also had a large BM.   Assessment & Plan:   Principal Problem:   Volume depletion Active Problems:   Hypotension due to hypovolemia   Underweight   Iron deficiency anemia   Chronic intestinal pseudo-obstruction   GERD (gastroesophageal reflux disease)   Abdominal pain with vomiting   Hypokalemia   Severe protein-calorie malnutrition (HCC)   Failure to thrive in adult   Nausea and vomiting   Sepsis (HCC)   Hypomagnesemia   Constipation   Volume depletion, gastrointestinal loss   Orthostatic hypotension secondary to volume depletion, nausea, vomiting,  diarrhea and suspected component of adrenal insufficiency  -Unable to complete cosyntropin stim test due to starting on IV Solu-Cortef on admission -Volume depletion thought to be secondary to ongoing GI loss -Continue midodrine, increased dose 10 mg 3 times daily. Fludrocortisone 0.1mg  daily.  Repeat orthostatic vital signs in the morning -Echocardiogram without structural abnl  -Appreciate cardiology -Give another 1L IVF today   Proteus mirabilis, E. coli UTI -Completed antibiotics  Cyclical vomiting syndrome -Appreciate GI, follow-up with them outpatient  Iron deficiency anemia -Transfused 2u pRBC 10/4 -Status post EGD 10/5 which showed normal esophagus, normal duodenum, erythematous mucosa in the stomach -IV Feraheme given -PPI -Continue to monitor CBC  -Appreciate GI and hematology   Severe protein calorie malnutrition -Appreciate dietitian    DVT prophylaxis:  Place and maintain sequential compression device Start: 05/19/20 0943 Place TED hose Start: 05/17/20 1315  Code Status: Full code Family Communication: Spouse at bedside  Disposition Plan:  Status is: Inpatient  Remains inpatient appropriate because:Hemodynamically unstable   Dispo: The patient is from: Home              Anticipated d/c is to: Home              Anticipated d/c date is: 1 day              Patient currently is not medically stable to d/c.  Continue Midodrine, fludrocortisone. IVF today. Remains orthostatic +    Antimicrobials:  Anti-infectives (From admission, onward)   Start     Dose/Rate Route Frequency Ordered Stop  05/19/20 1315  amoxicillin-clavulanate (AUGMENTIN) 875-125 MG per tablet 1 tablet        1 tablet Oral Every 12 hours 05/19/20 1219 05/24/20 0959   05/16/20 2000  piperacillin-tazobactam (ZOSYN) IVPB 3.375 g  Status:  Discontinued        3.375 g 12.5 mL/hr over 240 Minutes Intravenous Every 8 hours 05/16/20 1851 05/19/20 1219   05/16/20 1800  piperacillin-tazobactam  (ZOSYN) IVPB 3.375 g  Status:  Discontinued        3.375 g 12.5 mL/hr over 240 Minutes Intravenous Every 8 hours 05/16/20 1741 05/16/20 1851       Objective: Vitals:   05/27/20 0451 05/27/20 1120 05/27/20 1121 05/27/20 1123  BP: 118/82 (!) 87/55 (!) 85/57 (!) 64/44  Pulse: 85 100 (!) 107 (!) 126  Resp: 18 14    Temp: 98.1 F (36.7 C) 97.9 F (36.6 C)    TempSrc: Oral Oral    SpO2: 100% 99% 100% 99%  Weight: 36.6 kg     Height:        Intake/Output Summary (Last 24 hours) at 05/27/2020 1200 Last data filed at 05/27/2020 0147 Gross per 24 hour  Intake 337.16 ml  Output 1250 ml  Net -912.84 ml   Filed Weights   05/16/20 0841 05/20/20 0849 05/27/20 0451  Weight: 42.6 kg 42.6 kg 36.6 kg    Examination: General exam: Appears calm and comfortable, thin and frail  Respiratory system: Clear to auscultation. Respiratory effort normal. Cardiovascular system: S1 & S2 heard, RRR. No pedal edema. Gastrointestinal system: Abdomen is nondistended, soft and nontender. Normal bowel sounds heard. Central nervous system: Alert and oriented. Non focal exam. Speech clear  Extremities: Symmetric in appearance bilaterally  Skin: No rashes, lesions or ulcers on exposed skin  Psychiatry: Judgement and insight appear stable. Mood & affect appropriate.    Data Reviewed: I have personally reviewed following labs and imaging studies  CBC: Recent Labs  Lab 05/23/20 0500 05/24/20 0556 05/25/20 0514 05/26/20 0525 05/27/20 0448  WBC 10.7* 11.7* 10.9* 10.2 8.4  HGB 10.1* 9.7* 9.2* 9.0* 9.3*  HCT 31.6* 30.7* 28.5* 28.5* 29.9*  MCV 91.1 91.6 91.9 92.2 94.0  PLT 313 289 352 359 546   Basic Metabolic Panel: Recent Labs  Lab 05/21/20 0408 05/22/20 0417 05/26/20 0525  NA 137 136 136  K 3.8 3.9 3.8  CL 99 99 102  CO2 31 30 28   GLUCOSE 99 89 97  BUN 8 11 18   CREATININE 0.38* 0.37* <0.30*  CALCIUM 8.2* 8.5* 8.4*  MG 1.8 1.9  --   PHOS 3.4  --   --    GFR: CrCl cannot be calculated  (This lab value cannot be used to calculate CrCl because it is not a number: <0.30). Liver Function Tests: Recent Labs  Lab 05/21/20 0408  ALBUMIN 2.5*   No results for input(s): LIPASE, AMYLASE in the last 168 hours. No results for input(s): AMMONIA in the last 168 hours. Coagulation Profile: No results for input(s): INR, PROTIME in the last 168 hours. Cardiac Enzymes: No results for input(s): CKTOTAL, CKMB, CKMBINDEX, TROPONINI in the last 168 hours. BNP (last 3 results) No results for input(s): PROBNP in the last 8760 hours. HbA1C: No results for input(s): HGBA1C in the last 72 hours. CBG: No results for input(s): GLUCAP in the last 168 hours. Lipid Profile: No results for input(s): CHOL, HDL, LDLCALC, TRIG, CHOLHDL, LDLDIRECT in the last 72 hours. Thyroid Function Tests: No results for input(s): TSH, T4TOTAL, FREET4,  T3FREE, THYROIDAB in the last 72 hours. Anemia Panel: No results for input(s): VITAMINB12, FOLATE, FERRITIN, TIBC, IRON, RETICCTPCT in the last 72 hours. Sepsis Labs: No results for input(s): PROCALCITON, LATICACIDVEN in the last 168 hours.  No results found for this or any previous visit (from the past 240 hour(s)).    Radiology Studies: No results found.    Scheduled Meds: . (feeding supplement) PROSource Plus  30 mL Oral BID BM  . fludrocortisone  0.1 mg Oral Daily  . midodrine  10 mg Oral TID WC  . multivitamin with minerals  1 tablet Oral Daily  . pantoprazole  40 mg Oral BID AC  . polyethylene glycol  17 g Oral TID   Continuous Infusions: . sodium chloride 10 mL/hr at 05/27/20 0147  . sodium chloride       LOS: 11 days      Time spent: 25 minutes   Dessa Phi, DO [facility] 05/27/2020, 12:00 PM   Available via Epic secure chat 7am-7pm After these hours, please refer to coverage provider listed on [email or web address]

## 2020-05-27 NOTE — Telephone Encounter (Signed)
Scheduled per 10/11 sch msg. Called and spoke with pt, confirmed 10/25 appts

## 2020-05-27 NOTE — Progress Notes (Signed)
[facility] Gastroenterology Progress Note  Stacy [name] 39 y.o. 11-20-1980  CC:  Constipation  Subjective: Patient seen and examined at bedside.  No bowel movement today .  Denies abdominal pain.  Denies nausea or vomiting.  On regular diet.  ROS : Review of Systems  Respiratory: Negative for cough and shortness of breath.   Gastrointestinal: Positive for constipation. Negative for abdominal pain, blood in stool, diarrhea, heartburn, melena, nausea and vomiting.   Objective: Vital signs in last 24 hours: Vitals:   05/26/20 2053 05/27/20 0451  BP: (!) 154/104 118/82  Pulse: 86 85  Resp: 18 18  Temp: 98.8 F (37.1 C) 98.1 F (36.7 C)  SpO2: 100% 100%    Physical Exam:  General:  Cachectic, alert, oriented, cooperative, no distress  Head:  Normocephalic, without obvious abnormality, atraumatic  Eyes:  Anicteric sclera, EOMs intact  Lungs:   Clear to auscultation bilaterally, respirations unlabored  Heart:  Regular rate and rhythm, S1, S2 normal  Abdomen:    Soft, nontender, nondistended, bowel sounds present.  No peritoneal signs  Extremities: Extremities normal, atraumatic, no  edema  Pulses: 2+ and symmetric    Lab Results: Recent Labs    05/26/20 0525  NA 136  K 3.8  CL 102  CO2 28  GLUCOSE 97  BUN 18  CREATININE <0.30*  CALCIUM 8.4*   No results for input(s): AST, ALT, ALKPHOS, BILITOT, PROT, ALBUMIN in the last 72 hours. Recent Labs    05/26/20 0525 05/27/20 0448  WBC 10.2 8.4  HGB 9.0* 9.3*  HCT 28.5* 29.9*  MCV 92.2 94.0  PLT 359 397   No results for input(s): LABPROT, INR in the last 72 hours.     Assessment: - History of chronic intestinal pseudoobstruction, small bowel dysmotility,  - cyclical nausea and vomiting.  EGD May 20, 2020 showed mild gastritis.  Biopsies were negative for H. pylori. -  chronic constipation  Hypotension, mostly orthostatic and tachycardia: Has been evaluated by cardiology, who have recommended increasing sodium  intake to 4 g a day and for her to drink at least 3 to 4 L a day while continuing midodrine and fludrocortisone  Gradually decreasing Hgb over the last several days.  No frank melena or hematochezia.  Hemoglobin stable now.  Plan: -Continue Motegrity and MiraLAX upto 3 times a day. -1 more fleets enema today -Ambulate as tolerated -Okay to discharge from GI standpoint later today.  No further inpatient GI work-up planned.  GI will sign off. -Follow-up with primary GI at [facility] after discharge.   Ayra Hodgdon PA-C 05/27/2020, 9:41 AM  Contact #  [phone]

## 2020-05-28 DIAGNOSIS — E861 Hypovolemia: Secondary | ICD-10-CM

## 2020-05-28 DIAGNOSIS — K219 Gastro-esophageal reflux disease without esophagitis: Secondary | ICD-10-CM | POA: Diagnosis not present

## 2020-05-28 DIAGNOSIS — I9589 Other hypotension: Secondary | ICD-10-CM

## 2020-05-28 DIAGNOSIS — E869 Volume depletion, unspecified: Secondary | ICD-10-CM | POA: Diagnosis not present

## 2020-05-28 DIAGNOSIS — E876 Hypokalemia: Secondary | ICD-10-CM | POA: Diagnosis not present

## 2020-05-28 DIAGNOSIS — K5900 Constipation, unspecified: Secondary | ICD-10-CM | POA: Diagnosis not present

## 2020-05-28 DIAGNOSIS — I951 Orthostatic hypotension: Secondary | ICD-10-CM

## 2020-05-28 LAB — CBC
HCT: 27.6 % — ABNORMAL LOW (ref 36.0–46.0)
Hemoglobin: 8.9 g/dL — ABNORMAL LOW (ref 12.0–15.0)
MCH: 29.9 pg (ref 26.0–34.0)
MCHC: 32.2 g/dL (ref 30.0–36.0)
MCV: 92.6 fL (ref 80.0–100.0)
Platelets: 412 10*3/uL — ABNORMAL HIGH (ref 150–400)
RBC: 2.98 MIL/uL — ABNORMAL LOW (ref 3.87–5.11)
RDW: 17.2 % — ABNORMAL HIGH (ref 11.5–15.5)
WBC: 11.6 10*3/uL — ABNORMAL HIGH (ref 4.0–10.5)
nRBC: 0 % (ref 0.0–0.2)

## 2020-05-28 MED ORDER — PROSOURCE PLUS PO LIQD: 30.0000 mL | Freq: Two times a day (BID) | ORAL | Status: DC

## 2020-05-28 MED ORDER — FLUDROCORTISONE ACETATE 0.1 MG PO TABS
0.1000 mg | ORAL_TABLET | Freq: Two times a day (BID) | ORAL | Status: DC
  Filled 2020-05-28: qty 1

## 2020-05-28 MED ORDER — PRUCALOPRIDE SUCCINATE 2 MG PO TABS
2.0000 mg | ORAL_TABLET | Freq: Every day | ORAL | Status: DC
  Administered 2020-05-28: 2 mg via ORAL

## 2020-05-28 MED ORDER — POLYETHYLENE GLYCOL 3350 17 G PO PACK: 17.0000 g | PACK | Freq: Three times a day (TID) | ORAL | 0 refills | Status: AC

## 2020-05-28 MED ORDER — FLUDROCORTISONE ACETATE 0.1 MG PO TABS
0.1000 mg | ORAL_TABLET | Freq: Two times a day (BID) | ORAL | 2 refills | Status: DC
Start: 2020-05-28 — End: 2020-09-16

## 2020-05-28 MED ORDER — ADULT MULTIVITAMIN W/MINERALS CH: 1.0000 | ORAL_TABLET | Freq: Every day | ORAL | Status: DC

## 2020-05-28 MED ORDER — MIDODRINE HCL 10 MG PO TABS
10.0000 mg | ORAL_TABLET | Freq: Three times a day (TID) | ORAL | 2 refills | Status: DC
Start: 2020-05-28 — End: 2020-06-27

## 2020-05-28 NOTE — Progress Notes (Signed)
Pt discharged to home at this time. Prior to discharge, pt IV and tele was DC'd. Pt was provided discharge paperwork and discharge instructions along with medications and follow up instructions were reviewed. Pt verbalized understanding and stated no other concerns a this time. Pt was taken to personal vehicle driven by her husband. Pt stable at time of discharge and departure.

## 2020-05-28 NOTE — Discharge Summary (Signed)
Physician Discharge Summary  Stacy [name]:[id number] DOB: 29-Jan-1981 DOA: 05/16/2020  PCP: Stacy Minium, MD  Admit date: 05/16/2020 Discharge date: 05/28/2020  Time spent: 60 minutes  Recommendations for Outpatient Follow-up:  1. Follow-up with Stacy Minium, MD in 1 week.  On follow-up patient will need a basic metabolic profile, magnesium level, phosphorus level done to follow-up on electrolytes and renal function.  Patient also need a CBC done to follow-up on H&H.  Patient is orthostatic hypotension will need to be reassessed on follow-up.  Patient will likely benefit for referral to endocrinology for evaluation for possible adrenal insufficiency. 2. Follow-up with Dr. Michail [name], gastroenterology in 2 weeks or as previously scheduled. 3. Follow-up with Dr. Alvy [name], hematology/oncology in 2 weeks. 4. Patient will discharge home with home health therapies.   Discharge Diagnoses:  Principal Problem:   Volume depletion Active Problems:   Hypotension due to hypovolemia   Underweight   Iron deficiency anemia   Chronic intestinal pseudo-obstruction   GERD (gastroesophageal reflux disease)   Abdominal pain with vomiting   Hypokalemia   Severe protein-calorie malnutrition (HCC)   Failure to thrive in adult   Nausea and vomiting   Sepsis ([place])   Hypomagnesemia   Constipation   Volume depletion, gastrointestinal loss   Orthostatic hypotension   Hypophosphatemia   Discharge Condition: Stable and improved  Diet recommendation: Regular  Filed Weights   05/16/20 0841 05/20/20 0849 05/27/20 0451  Weight: 42.6 kg 42.6 kg 36.6 kg    History of present illness:  HPI per Dr. Benny Lennert The patient is a 39 yr old woman. She stated that she used to struggle with weight loss. However, about 7 years ago she began losing weight without trying. She had intermittent nausea and vomiting and frequently not being able to eat. She continued to lose weight. She stated it was like a  nightmare. Recently the patient was discharged from this institution after a stay for intestinal pseudo-obstruction.  On day of admission, the patient's pain was relieved with BM.   The patient carried a past medical history significant for anxiety, chronic intestinal pseudo-obstruction, GERD, and FTT.  In the ED she was found to be hypotensive with tachycardia. Temperature, respiratory rate, and O2 saturations were normal. Creatinine is 0.45. Fe 23, TIBC 85, Ferritin is 147, albumin is 1.5, LFT's are normal. Lactic Acid is  2.0 upon presentation, but came down to 1.6 with hydration. WBC is 14.1 (increased bands and neutrophils), and Hemoglobin is 8.7. This is down from 9.2 on 05/01/2020. He is negative for COVID.   The patient denied fevers, chills, cough, shortness of breath, wheezes, rashes, headaches, or neurological changes. She admitted to dizziness with standing, weakness, and feeling generally unwell.  [facility] were consulted to admit the patient for further evaluation and treatment. She will be admitted to a telemetry bed. I have ordered celiac work up as well as a cosyntropin stim test. EDP has discussed the patient with Dr. Therisa [name].  The patient remains hypotensive despite 3 L fluid. Will add hydrocortisone.  Hospital Course:  1 hypotension/volume depletion/nausea/emesis/diarrhea/sepsis ruled out Patient presented noted to be hypotensive, volume depleted, nauseous, emesis and diarrhea.  Concern for sepsis as patient noted to be hypotensive, with a leukocytosis and tachycardia, lactic acidosis which responded to IV fluids.  Urinalysis done concerning for UTI.  Abdominal films unremarkable.  Concern for intra-abdominal infection as such patient placed empirically on IV Zosyn.  Patient also placed on IV hydrocortisone due to persistent blood pressure  despite 3 L of IV fluids.  Patient noted to have more formed stools.  Patient stated had an episode of diarrhea on presentation to  the ED.  GI pathogen panel negative.  C. difficile PCR canceled as patient with formed stools.  Patient remained afebrile.  Urine cultures with Proteus mirabilis and E. coli.  Patient received a full course of antibiotic treatment during this hospitalization no further antibiotic treatment needed.   See problem #3.   2.  Hypomagnesemia/hypokalemia/hypophosphatemia Likely secondary to GI losses.  Repleted.  Outpatient follow-up.  3.  Orthostatic hypotension/volume depletion/??  Suspect component of adrenal insufficiency. Likely secondary to ongoing GI losses.  Patient initially hydrated aggressively on presentation and noted to still be hypotensive after 3 L of IV fluids and subsequently placed on IV Solu-Cortef.  Patient was pancultured.  Urine cultures positive for proteus mirabilis and E. coli.  Patient noted during the hospitalization initially to still be symptomatic with orthostasis.  IV Solu-Cortef discontinued and patient hydrated and was euvolemic.  Due to IV Solu-Cortef cosyntropin stim test was not done as result would have been skewed.  TED hose was ordered.  Patient also received IV albumin every 6 hours x1 day.  Patient initially improved clinically however due to ongoing symptomatic orthostasis patient subsequently started on midodrine 5 mg 3 times daily.  Patient still remained orthostatic and midodrine uptitrated to 10 mg 3 times daily.  Cardiology was consulted and patient seen in consultation by Dr. Oval [name].  2D echo was reviewed and felt IVC was flat and patient received a fluid bolus.  Patient's diet was liberalized and 4 g of sodium daily was recommended per cardiology to the patient.  Patient improved clinically with symptomatic improvement.  On day of discharge patient was still orthostatic however had improved clinically with some improvement with orthostasis and was totally asymptomatic.  Patient wanted to go home.  Patient's Florinef which was added to midodrine at 0.1 mg daily  was uptitrated to 0.1 mg twice daily as recommended per cardiology.  Patient be discharged home in stable and improved condition on midodrine as well as Florinef.  Close outpatient follow-up with PCP.  Patient may need evaluation in the outpatient setting by endocrinology due to concerns for adrenal insufficiency.   4.  Gastroesophageal reflux disease Status post upper endoscopy which showed a normal esophagus, normal duodenum, erythematous mucosa in the stomach.    Patient was on IV PPI and subsequently transition to oral PPI.  Outpatient follow-up.   5.  Nausea, vomiting, weight loss, abdominal pain Patient has had an extensive work-up in the outpatient setting and during last hospitalization.  Concern for celiac disease and bacterial overgrowth of small bowel versus adrenal insufficiency.  Patient noted during last hospitalization to have a tissue transglutaminase study which was negative.  Antigliadin antibodies negative.  Improved clinically.  Tolerated soft diet and diet has been advanced to regular diet per GI.  Per GI clinical presentation suggestive of cyclical vomiting syndrome.  GI discussed with patient and husband about principles of gastric physiology and cyclical vomiting in notion of a gastroparesis diet which is helpful at times of symptomatic exacerbation.    Patient maintained on antiemetics during the hospitalization as needed.  Patient improved clinically.  Did not have any further nausea or emesis and tolerated diet.  Outpatient follow-up with GI.  6.  Proteus mirabilis/E. coli UTI Urine cultures with Proteus mirabilis and E. coli.    Patient initially placed on IV Zosyn and subsequently transition to oral  Augmentin and completed a 7-day course of treatment.  Outpatient follow-up.    7.  Iron deficiency anemia Anemia panel with iron level of 23, TIBC of 85, folate of 6, ferritin 147, B12 514.  Hemoglobin dropped down to 6.3 from 8.2.  Status post 2 units packed red blood cells.   Hemoglobin stabilized at 8.9 by day of discharge.  FOBT was positive.  Patient received 2 doses of IV Feraheme during the hospitalization. Patient seen by GI and underwent upper endoscopy which showed normal esophagus, normal duodenum, erythematous mucosa in the stomach.  Patient maintained on IV PPI twice daily and subsequently transition to oral Protonix daily.  Oncology was consulted and patient seen in consultation by Dr. Simeon Craft such.  Oncology recommended second dose of IV Feraheme which patient received with close outpatient follow-up 2 weeks post discharge.  Patient will be discharged in stable condition.  8.  Chronic constipation Was followed by GI during the hospitalization.  Patient's MiraLAX was increased to 3 times daily and recommended to continue on Motegrity.  Patient received a fleets enema during the hospitalization.  GI felt no further work-up needed at this time and patient to follow-up with primary gastroenterologist in the outpatient setting as well as her primary GI at [facility] post discharge.  Outpatient follow-up.    9.  Severe protein calorie malnutrition Likely secondary to GI symptoms.  Patient noted to have an albumin level of 1.4.  Patient also noted to be orthostatic.  Status post IV albumin every 6 hours x1 day.  Albumin level improved.  Patient's diet was liberalized and patient placed on a regular diet.  Due to orthostasis patient also advised for 4 g sodium diet daily.  Patient was also seen by the dietitian.  Outpatient follow-up.    Procedures:  Abdominal x-ray 05/16/2020  Transfusion 2 units packed red blood cells 05/19/2020  Upper endoscopy 05/20/2020 per Dr. Penelope Coop  Consultations:  Cardiology: Dr. Skeet Latch 05/25/2020  Gastroenterology: Dr. Therisa [name] 05/16/2020  Hematology/oncology: Dr. Alvy [name] 06/04/2020  Discharge Exam: Vitals:   05/28/20 0909 05/28/20 1348  BP: 92/63 104/68  Pulse: (!) 113 (!) 105  Resp: 20 18  Temp: 97.7 F (36.5 C) 98.1 F  (36.7 C)  SpO2: 99% 99%    General: NAD Cardiovascular: RRR Respiratory: CTAB  Discharge Instructions   Discharge Instructions    Diet general   Complete by: As directed    Increase activity slowly   Complete by: As directed      Allergies as of 05/28/2020      Reactions   Tetracyclines & Related Swelling   Swelling in spine; required spinal tap.   Erythromycin Other (See Comments)   Swelling in spine; required spinal tap   Raspberry Rash   Sulfonamide Derivatives Rash   Reaction to cream      Medication List    STOP taking these medications   ciprofloxacin 250 MG tablet Commonly known as: CIPRO   ibuprofen 200 MG tablet Commonly known as: ADVIL   Poly-Iron 150 150 MG capsule Generic drug: iron polysaccharides     TAKE these medications   (feeding supplement) PROSource Plus liquid Take 30 mLs by mouth 2 (two) times daily between meals.   acetaminophen 500 MG tablet Commonly known as: TYLENOL Take 500 mg by mouth every 6 (six) hours as needed for headache (pain).   ALIGN PO Take 1 capsule by mouth daily.   cholecalciferol 25 MCG (1000 UNIT) tablet Commonly known as: VITAMIN D3 TAKE 2  TABLETS (2,000 UNITS TOTAL) BY MOUTH DAILY. What changed: See the new instructions.   Compro 25 MG suppository Generic drug: prochlorperazine Place 25 mg rectally every 12 (twelve) hours as needed for nausea.   cyanocobalamin 1000 MCG/ML injection Commonly known as: (VITAMIN B-12) Inject 1,000 mcg into the muscle every 30 (thirty) days.   famotidine 20 MG tablet Commonly known as: PEPCID TAKE 1 TABLET BY MOUTH EVERYDAY AT BEDTIME What changed: See the new instructions.   fludrocortisone 0.1 MG tablet Commonly known as: FLORINEF Take 1 tablet (0.1 mg total) by mouth 2 (two) times daily.   midodrine 10 MG tablet Commonly known as: PROAMATINE Take 1 tablet (10 mg total) by mouth 3 (three) times daily with meals.   montelukast 10 MG tablet Commonly known as:  SINGULAIR Take 10 mg by mouth daily.   Motegrity 2 MG Tabs Generic drug: Prucalopride Succinate Take 1 tablet (2 mg total) by mouth daily.   multivitamin with minerals Tabs tablet Take 1 tablet by mouth daily. Start taking on: May 29, 2020   omeprazole 40 MG capsule Commonly known as: PRILOSEC TAKE 1 CAPSULE BY MOUTH EVERY DAY What changed: how much to take   ondansetron 8 MG disintegrating tablet Commonly known as: ZOFRAN-ODT Take 8 mg by mouth 3 (three) times daily as needed for nausea/vomiting. What changed: Another medication with the same name was removed. Continue taking this medication, and follow the directions you see here.   polyethylene glycol 17 g packet Commonly known as: MIRALAX / GLYCOLAX Take 17 g by mouth 3 (three) times daily. What changed: when to take this            Durable Medical Equipment  (From admission, onward)         Start     Ordered   05/22/20 1603  For home use only DME 3 n 1  Once        05/22/20 1603   05/22/20 1410  For home use only DME Shower stool  Once        05/22/20 1410         Allergies  Allergen Reactions  . Tetracyclines & Related Swelling    Swelling in spine; required spinal tap.   . Erythromycin Other (See Comments)    Swelling in spine; required spinal tap  . Raspberry Rash  . Sulfonamide Derivatives Rash    Reaction to cream    Follow-up Information    [facility] Follow up.   Specialty: [place] Why: [facility] physical therapy/occupational therapy Contact information: [place] [address] [phone]        [facility] Follow up.   Why: rolling walker/shower stool Contact information: [address] [place] [address] [phone]        Stacy Minium, MD. Schedule an appointment as soon as possible for a visit in 1 week(s).   Specialty: Family Medicine Contact information: [address] [place]  [address] [phone]        Skeet Latch, MD .   Specialty: Cardiology Contact information: [address] [place] [address] [phone]        Wilford Corner, MD Follow up in 2 week(s).   Specialty: Gastroenterology Why: f/u in 2 weeks or as scheduled. Contact information: [address] [place] [address] [phone]        Heath Lark, MD. Schedule an appointment as soon as  possible for a visit in 2 week(s).   Specialty: Hematology and Oncology Contact information: [place] [address] [phone]                The results of significant diagnostics from this hospitalization (including imaging, microbiology, ancillary and laboratory) are listed below for reference.    Significant Diagnostic Studies: DG Abd 2 Views  Result Date: 05/16/2020 CLINICAL DATA:  Diarrhea and hypotension EXAM: X-RAY ABDOMEN 2 VIEWS COMPARISON:  04/26/2020 FINDINGS: Lung bases are clear. No free air beneath the diaphragm. 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 Mild increased small bowel gas, decreased distension compared with radiograph from 04/26/2020. Scattered colon gas is present. No radiopaque calculi. IMPRESSION: Mild increased small bowel gas without definitive obstructive pattern. Findings could be secondary to ileus or enteritis; degree of small bowel distension is improved since comparison radiograph from 04/26/2020. Electronically Signed   By: Donavan Foil M.D.   On: 05/16/2020 17:27   ECHOCARDIOGRAM COMPLETE  Result Date: 05/24/2020    ECHOCARDIOGRAM REPORT   Patient Name:   DOMINIQUA COONER Date of Exam: 05/24/2020 Medical Rec #:  [id number]       Height:       63.0 in Accession #:    [id number]      Weight:       93.9 lb Date of Birth:  May 24, 1981       BSA:          1.402 m Patient Age:    39 years        BP:           60/41 mmHg Patient Gender: F               HR:           102 bpm. Exam  Location:  Inpatient Procedure: 2D Echo Indications:    orthostatic hypotension  History:        Patient has no prior history of Echocardiogram examinations.  Sonographer:    Jannett Celestine RDCS (AE) Referring Phys: [phone] [facility]  Sonographer Comments: Suboptimal apical window. Image acquisition challenging due to respiratory motion and Image acquisition challenging due to patient body habitus. see comments IMPRESSIONS  1. Grossly, no wall motiion abnormalities noted. Left ventricular ejection fraction, by estimation, is 50 to 55%. The left ventricle has low normal function. Left ventricular endocardial border not optimally defined to evaluate regional wall motion. Left ventricular diastolic parameters were normal.  2. Right ventricular systolic function is normal. The right ventricular size is normal.  3. The mitral valve is grossly normal. No evidence of mitral valve regurgitation.  4. The aortic valve was not well visualized. Aortic valve regurgitation is trivial.  5. The inferior vena cava is normal in size with greater than 50% respiratory variability, suggesting right atrial pressure of 3 mmHg. Comparison(s): No prior Echocardiogram. FINDINGS  Left Ventricle: Grossly, no wall motiion abnormalities noted. Left ventricular ejection fraction, by estimation, is 50 to 55%. The left ventricle has low normal function. Left ventricular endocardial border not optimally defined to evaluate regional wall motion. The left ventricular internal cavity size was normal in size. There is no left ventricular hypertrophy. Left ventricular diastolic parameters were normal. Right Ventricle: The right ventricular size is normal. Right vetricular wall thickness was not well visualized. Right ventricular systolic function is normal. Left Atrium: Left atrial size was normal in size. Right Atrium: Right atrial size was normal in size. Pericardium: Trivial pericardial effusion  is present. Mitral Valve: The mitral valve is grossly  normal. No evidence of mitral valve regurgitation. Tricuspid Valve: The tricuspid valve is grossly normal. Tricuspid valve regurgitation is not demonstrated. Aortic Valve: The aortic valve was not well visualized. Aortic valve regurgitation is trivial. Pulmonic Valve: The pulmonic valve was not well visualized. Pulmonic valve regurgitation is not visualized. Aorta: The aortic root is normal in size and structure, the aortic arch was not well visualized and the ascending aorta was not well visualized. Venous: The inferior vena cava is normal in size with greater than 50% respiratory variability, suggesting right atrial pressure of 3 mmHg. IAS/Shunts: The interatrial septum was not assessed.  LEFT VENTRICLE PLAX 2D LVIDd:         4.50 cm  Diastology LVIDs:         3.00 cm  LV e' medial:    11.50 cm/s LV PW:         0.80 cm  LV E/e' medial:  4.9 LV IVS:        0.60 cm  LV e' lateral:   14.30 cm/s LVOT diam:     1.80 cm  LV E/e' lateral: 3.9 LV SV:         30 LV SV Index:   22 LVOT Area:     2.54 cm  RIGHT VENTRICLE RV S prime:     10.30 cm/s TAPSE (M-mode): 1.5 cm LEFT ATRIUM         Index LA diam:    2.60 cm 1.85 cm/m  AORTIC VALVE LVOT Vmax:   73.10 cm/s LVOT Vmean:  59.800 cm/s LVOT VTI:    0.119 m  AORTA Ao Root diam: 2.50 cm MITRAL VALVE MV Area (PHT): 4.39 cm    SHUNTS MV Decel Time: 173 msec    Systemic VTI:  0.12 m MV E velocity: 56.10 cm/s  Systemic Diam: 1.80 cm MV A velocity: 50.10 cm/s MV E/A ratio:  1.12 Rudean Haskell MD Electronically signed by Rudean Haskell MD Signature Date/Time: 05/24/2020/12:58:34 PM    Final     Microbiology: No results found for this or any previous visit (from the past 240 hour(s)).   Labs: Basic Metabolic Panel: Recent Labs  Lab 05/22/20 0417 05/26/20 0525  NA 136 136  K 3.9 3.8  CL 99 102  CO2 30 28  GLUCOSE 89 97  BUN 11 18  CREATININE 0.37* <0.30*  CALCIUM 8.5* 8.4*  MG 1.9  --    Liver Function Tests: No results for input(s): AST, ALT,  ALKPHOS, BILITOT, PROT, ALBUMIN in the last 168 hours. No results for input(s): LIPASE, AMYLASE in the last 168 hours. No results for input(s): AMMONIA in the last 168 hours. CBC: Recent Labs  Lab 05/24/20 0556 05/25/20 0514 05/26/20 0525 05/27/20 0448 05/28/20 0513  WBC 11.7* 10.9* 10.2 8.4 11.6*  HGB 9.7* 9.2* 9.0* 9.3* 8.9*  HCT 30.7* 28.5* 28.5* 29.9* 27.6*  MCV 91.6 91.9 92.2 94.0 92.6  PLT 289 352 359 397 412*   Cardiac Enzymes: No results for input(s): CKTOTAL, CKMB, CKMBINDEX, TROPONINI in the last 168 hours. BNP: BNP (last 3 results) No results for input(s): BNP in the last 8760 hours.  ProBNP (last 3 results) No results for input(s): PROBNP in the last 8760 hours.  CBG: No results for input(s): GLUCAP in the last 168 hours.     Signed:  Irine Seal MD.  [facility] 05/28/2020, 5:07 PM

## 2020-05-28 NOTE — Progress Notes (Signed)
Physical Therapy Treatment Patient Details Name: Stacy [name] MRN: [id number] DOB: 30-Dec-1980 Today's Date: 05/28/2020    History of Present Illness 39 yo female admitted with volume depletion, hypotension. Hx of anxiety, GERD, FTT, chronic intestinal pseudo obstruction.    PT Comments    Elizah was able to amb hallway distance today, no rest breaks, no LOB, no dizziness or other symptoms related to orthostasis. Discussed importance of moving slowly at home, especially when transitioning OOB/EOB or after sitting for longer periods of time and standing. Pt has rollator at home, discussed utilizing for safety as well. Pt is in agreement and is hopeful to d/c soon.  Follow Up Recommendations  Home health PT;Supervision for mobility/OOB     Equipment Recommendations  None recommended by PT    Recommendations for Other Services       Precautions / Restrictions Precautions Precaution Comments: orthostatic hypotension    Mobility  Bed Mobility Overal bed mobility: Modified Independent             General bed mobility comments: pt denied symptoms/dizziness with supine to sit.  Transfers Overall transfer level: Needs assistance Equipment used: None Transfers: Sit to/from Stand Sit to Stand: Supervision         General transfer comment: for safety   Ambulation/Gait Ambulation/Gait assistance: Min guard Gait Distance (Feet): 300 Feet Assistive device: Rolling walker (2 wheeled) Gait Pattern/deviations: Step-through pattern;Decreased stride length     General Gait Details: pt denied any dizziness, initially amb ~ 15' without device, slightly unsteady so switched to RW. min/guard for safety d/t orthostatic hypotension. no LOB    Stairs             Wheelchair Mobility    Modified Rankin (Stroke Patients Only)       Balance     Sitting balance-Leahy Scale: Good       Standing balance-Leahy Scale: Fair Standing balance comment: no external support,  pt with static standing only due to orthostasis                            Cognition Arousal/Alertness: Awake/alert Behavior During Therapy: WFL for tasks assessed/performed Overall Cognitive Status: Within Functional Limits for tasks assessed                                        Exercises      General Comments        Pertinent Vitals/Pain Pain Assessment: No/denies pain    Home Living                      Prior Function            PT Goals (current goals can now be found in the care plan section) Acute Rehab PT Goals Patient Stated Goal: to get better. "i need help" PT Goal Formulation: With patient/family Time For Goal Achievement: 06/04/20 Potential to Achieve Goals: Good Progress towards PT goals: Progressing toward goals    Frequency    Min 3X/week      PT Plan Current plan remains appropriate    Co-evaluation              AM-PAC PT "6 Clicks" Mobility   Outcome Measure  Help needed turning from your back to your side while in a flat bed without using bedrails?: None Help needed  moving from lying on your back to sitting on the side of a flat bed without using bedrails?: None Help needed moving to and from a bed to a chair (including a wheelchair)?: A Little Help needed standing up from a chair using your arms (e.g., wheelchair or bedside chair)?: A Little Help needed to walk in hospital room?: A Little Help needed climbing 3-5 steps with a railing? : A Little 6 Click Score: 20    End of Session Equipment Utilized During Treatment: Gait belt Activity Tolerance: Patient tolerated treatment well Patient left: in bed;with call bell/phone within reach;with family/visitor present Nurse Communication: Mobility status PT Visit Diagnosis: Muscle weakness (generalized) (M62.81);Difficulty in walking, not elsewhere classified (R26.2)     Time: 2263-3354 PT Time Calculation (min) (ACUTE ONLY): 14 min  Charges:   $Gait Training: 8-22 mins                     Baxter Flattery, PT  Acute Rehab Dept ([place]) [phone] Pager [phone]  05/28/2020    [facility] 05/28/2020, 3:58 PM

## 2020-06-02 ENCOUNTER — Other Ambulatory Visit: Payer: Self-pay | Admitting: Hematology and Oncology

## 2020-06-05 ENCOUNTER — Inpatient Hospital Stay: Payer: 59 | Admitting: Family Medicine

## 2020-06-06 ENCOUNTER — Encounter: Payer: Self-pay | Admitting: Family Medicine

## 2020-06-06 ENCOUNTER — Other Ambulatory Visit: Payer: Self-pay

## 2020-06-06 ENCOUNTER — Ambulatory Visit (INDEPENDENT_AMBULATORY_CARE_PROVIDER_SITE_OTHER): Payer: 59 | Admitting: Family Medicine

## 2020-06-06 VITALS — BP 105/80 | HR 89 | Temp 98.5°F | Resp 17 | Ht 63.0 in | Wt 81.2 lb

## 2020-06-06 DIAGNOSIS — E876 Hypokalemia: Secondary | ICD-10-CM

## 2020-06-06 DIAGNOSIS — I9589 Other hypotension: Secondary | ICD-10-CM | POA: Diagnosis not present

## 2020-06-06 DIAGNOSIS — E43 Unspecified severe protein-calorie malnutrition: Secondary | ICD-10-CM | POA: Diagnosis not present

## 2020-06-06 DIAGNOSIS — E871 Hypo-osmolality and hyponatremia: Secondary | ICD-10-CM

## 2020-06-06 DIAGNOSIS — E539 Vitamin B deficiency, unspecified: Secondary | ICD-10-CM | POA: Diagnosis not present

## 2020-06-06 DIAGNOSIS — D509 Iron deficiency anemia, unspecified: Secondary | ICD-10-CM

## 2020-06-06 DIAGNOSIS — R112 Nausea with vomiting, unspecified: Secondary | ICD-10-CM

## 2020-06-06 DIAGNOSIS — E861 Hypovolemia: Secondary | ICD-10-CM

## 2020-06-06 LAB — CBC WITH DIFFERENTIAL/PLATELET
Basophils Absolute: 0 10*3/uL (ref 0.0–0.1)
Basophils Relative: 0.8 % (ref 0.0–3.0)
Eosinophils Absolute: 0.1 10*3/uL (ref 0.0–0.7)
Eosinophils Relative: 1.7 % (ref 0.0–5.0)
HCT: 31.1 % — ABNORMAL LOW (ref 36.0–46.0)
Hemoglobin: 10.5 g/dL — ABNORMAL LOW (ref 12.0–15.0)
Lymphocytes Relative: 38.5 % (ref 12.0–46.0)
Lymphs Abs: 2.1 10*3/uL (ref 0.7–4.0)
MCHC: 33.9 g/dL (ref 30.0–36.0)
MCV: 88.9 fl (ref 78.0–100.0)
Monocytes Absolute: 0.6 10*3/uL (ref 0.1–1.0)
Monocytes Relative: 11.1 % (ref 3.0–12.0)
Neutro Abs: 2.7 10*3/uL (ref 1.4–7.7)
Neutrophils Relative %: 47.9 % (ref 43.0–77.0)
Platelets: 467 10*3/uL — ABNORMAL HIGH (ref 150.0–400.0)
RBC: 3.5 Mil/uL — ABNORMAL LOW (ref 3.87–5.11)
RDW: 17.8 % — ABNORMAL HIGH (ref 11.5–15.5)
WBC: 5.6 10*3/uL (ref 4.0–10.5)

## 2020-06-06 LAB — BASIC METABOLIC PANEL
BUN: 11 mg/dL (ref 6–23)
CO2: 33 mEq/L — ABNORMAL HIGH (ref 19–32)
Calcium: 8 mg/dL — ABNORMAL LOW (ref 8.4–10.5)
Chloride: 101 mEq/L (ref 96–112)
Creatinine, Ser: 0.56 mg/dL (ref 0.40–1.20)
GFR: 114.3 mL/min (ref >60.00)
Glucose, Bld: 77 mg/dL (ref 70–99)
Potassium: 3.5 mEq/L (ref 3.5–5.1)
Sodium: 138 mEq/L (ref 135–145)

## 2020-06-06 LAB — HEPATIC FUNCTION PANEL
ALT: 8 U/L (ref 0–35)
AST: 9 U/L (ref 0–37)
Albumin: 3 g/dL — ABNORMAL LOW (ref 3.5–5.2)
Alkaline Phosphatase: 64 U/L (ref 39–117)
Bilirubin, Direct: 0.1 mg/dL (ref 0.0–0.3)
Total Bilirubin: 0.3 mg/dL (ref 0.2–1.2)
Total Protein: 4.9 g/dL — ABNORMAL LOW (ref 6.0–8.3)

## 2020-06-06 LAB — PHOSPHORUS: Phosphorus: 3.8 mg/dL (ref 2.3–4.6)

## 2020-06-06 LAB — MAGNESIUM: Magnesium: 1.4 mg/dL — ABNORMAL LOW (ref 1.5–2.5)

## 2020-06-06 MED ORDER — CYANOCOBALAMIN 1000 MCG/ML IJ SOLN
1000.0000 ug | Freq: Once | INTRAMUSCULAR | Status: AC
  Administered 2020-06-06: 1000 ug via INTRAMUSCULAR

## 2020-06-06 MED ORDER — MIRTAZAPINE 15 MG PO TABS: 15.0000 mg | ORAL_TABLET | Freq: Every day | ORAL | 3 refills | Status: DC

## 2020-06-06 NOTE — Assessment & Plan Note (Signed)
Pt's Hgb dropped to 6.3 after IVFs.  She was transfused 2 units PRBCs and hgb was 8.9 by d/c.  Husband reports no one is able to tell him why her blood count is dropping.  Explained that she had heme + stools at hospital and she really does need a colonoscopy once she is more stable (EGD was unrevealing).  Has f/u for repeat iron infusion w/ Dr Alvy Bimler.  Will follow along.

## 2020-06-06 NOTE — Assessment & Plan Note (Signed)
Was corrected in hospital but will repeat today to ensure stability.

## 2020-06-06 NOTE — Progress Notes (Signed)
Subjective:    Patient ID: Stacy [name], female    DOB: 1981/07/08, 39 y.o.   MRN: [id number]  [facility] f/u- Pt was admitted from 10/1-10/13 for nausea/vomiting, hypotension, volume depletion.  Upon admission there was concern for sepsis given hypotension, elevated WBC, tachycardia, and lactic acidosis.  Lactic acidosis improved w/ IVF.  UA showed UTI.  She was started on IV Zosyn and IV hydrocortisone.  UTD was treated appropriately.  She had hypomagnesemia, hypokalemia, and hypophosphatemia due to GI losses and these were repleted.  Due to ongoing orthostasis, she was started on midodrine 5mg  TID which was increased to 10mg  TID.  She was encouraged to eat 4g of sodium daily.  Florinef was added at 0.1mg  BID.  Her hgb also dropped during admission, from 8.2-->6.3  She received 2 units PRBCs and hgb stabilized at 8.9 by d/c.  She will be followed by hematology at d/c.  Pt reports she is currently eating and 'keeping it down'.  Pt reports she is able to eat everything at this point but if stomach hurts she needs to switch to clear liquid diet and soft foods.  Pt reports pain is 'manageable'.  BMs have normalized.  Dizziness is 'much better'.  Currently on ProSure.  Husband reports calories are [phone]/day.  He is trying to increase to 1500 daily.  Nutritionist was recommended as was Endocrinology to assess for possible adrenal insuffiency.    Virtual visit w/ [facility] had recommended possible anti-depressant to increase appetite.  She has appt at [facility] on 10/29 to see GI motility specialist Sees Dr Alvy Bimler next week regarding anemia- iron infusion planned  Reviewed H&P, consult notes, labs, procedure reports, and D/C summary. Med rec performed   Review of Systems For ROS see HPI   This visit occurred during the SARS-CoV-2 public health emergency.  Safety protocols were in place, including screening questions prior to the visit, additional usage of staff PPE, and extensive cleaning of exam room  while observing appropriate contact time as indicated for disinfecting solutions.       Objective:   Physical Exam Vitals reviewed.  Constitutional:      General: She is not in acute distress.    Appearance: She is ill-appearing (cachectic).  HENT:     Head: Normocephalic and atraumatic.  Eyes:     Extraocular Movements: Extraocular movements intact.     Conjunctiva/sclera: Conjunctivae normal.     Pupils: Pupils are equal, round, and reactive to light.  Cardiovascular:     Rate and Rhythm: Normal rate and regular rhythm.     Pulses: Normal pulses.     Heart sounds: Normal heart sounds. No murmur heard.   Pulmonary:     Effort: Pulmonary effort is normal. No respiratory distress.     Breath sounds: Normal breath sounds. No wheezing or rhonchi.  Abdominal:     Comments: scaphoid  Musculoskeletal:     Cervical back: Normal range of motion and neck supple.     Right lower leg: No edema.     Left lower leg: No edema.  Lymphadenopathy:     Cervical: No cervical adenopathy.  Skin:    General: Skin is warm and dry.     Coloration: Skin is pale.  Neurological:     General: No focal deficit present.     Mental Status: She is alert and oriented to person, place, and time.  Psychiatric:        Mood and Affect: Mood normal.  Behavior: Behavior normal.        Thought Content: Thought content normal.           Assessment & Plan:

## 2020-06-06 NOTE — Assessment & Plan Note (Signed)
Ongoing problem for pt.  BMI is down to 14.38  She is cachectic.  Currently eating [phone] calories daily and husband is trying to increase this to at least 1500 calories/day.  I am almost worried about a refeeding syndrome given pts malnourished state.  She was worked with a Automotive engineer before and will refer again.  Encouraged ProSure 1-2x/day.  Again asked if anyone had discussed parenteral nutrition but apparently this was not seriously discussed at the hospital.  Will start Remeron to improve appetite as pt feels her stomach would be able to handle the increased intake.  Will continue to follow closely.

## 2020-06-06 NOTE — Assessment & Plan Note (Signed)
Chronic problem for pt.  Has currently resolved.  Has upcoming appt w/ motility specialist at [facility].  Has local GI (Dr Michail Sermon).

## 2020-06-06 NOTE — Assessment & Plan Note (Signed)
Ongoing issue for pt.  Had aggressive IVF during hospitalization but remained orthostatic.  Was started on Midodrine 5mg  TID and Florinef 0.1mg  BID.  BP is stable today and pt reports dizziness has improved.  Has f/u scheduled w/ Cardiology and will refer to Endo for possible adrenal insufficiency work up.  Pt expressed understanding and is in agreement w/ plan.

## 2020-06-06 NOTE — Patient Instructions (Addendum)
Follow up in 6 weeks to circle back after all of your appts We'll notify you of your lab results and make any changes if needed We'll call you with your nutrition and Endocrinology appts START the Remeron nightly Try and continually increase your caloric intake I would continue to ask about the anemia Call with any questions or concerns Hang in there!

## 2020-06-07 LAB — IRON,TIBC AND FERRITIN PANEL
%SAT: 53 % (calc) — ABNORMAL HIGH (ref 16–45)
Ferritin: 377 ng/mL — ABNORMAL HIGH (ref 16–154)
Iron: 100 ug/dL (ref 40–190)
TIBC: 188 mcg/dL (calc) — ABNORMAL LOW (ref 250–450)

## 2020-06-09 ENCOUNTER — Encounter: Payer: Self-pay | Admitting: Hematology and Oncology

## 2020-06-09 ENCOUNTER — Other Ambulatory Visit: Payer: Self-pay | Admitting: Hematology and Oncology

## 2020-06-09 ENCOUNTER — Inpatient Hospital Stay: Payer: 59

## 2020-06-09 ENCOUNTER — Other Ambulatory Visit: Payer: Self-pay

## 2020-06-09 ENCOUNTER — Inpatient Hospital Stay: Payer: 59 | Attending: Hematology and Oncology | Admitting: Hematology and Oncology

## 2020-06-09 DIAGNOSIS — D509 Iron deficiency anemia, unspecified: Secondary | ICD-10-CM | POA: Insufficient documentation

## 2020-06-09 DIAGNOSIS — R627 Adult failure to thrive: Secondary | ICD-10-CM | POA: Diagnosis not present

## 2020-06-09 DIAGNOSIS — E43 Unspecified severe protein-calorie malnutrition: Secondary | ICD-10-CM | POA: Diagnosis not present

## 2020-06-09 DIAGNOSIS — K5989 Other specified functional intestinal disorders: Secondary | ICD-10-CM | POA: Diagnosis not present

## 2020-06-09 NOTE — Assessment & Plan Note (Signed)
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

## 2020-06-09 NOTE — Assessment & Plan Note (Signed)
I have canceled her blood work today Her results from blood work from last Friday show adequate iron replacement She has persistent anemia, likely component of anemia of chronic illness secondary to her malnutrition She does not need iron infusion or blood transfusion support She has appointment to see her primary care doctor again in about 6 weeks with likely repeat blood work For that reason, I will just follow on the lab results from her primary care doctor's office At the first sign of recurrent iron deficiency anemia again, I will schedule intravenous iron infusion She is in agreement with the plan of care

## 2020-06-09 NOTE — Assessment & Plan Note (Signed)
I recommend she takes oral magnesium supplement as recommended by her primary care doctor

## 2020-06-09 NOTE — Progress Notes (Signed)
[facility] OFFICE PROGRESS NOTE  Stacy Minium, MD  ASSESSMENT & PLAN:  Iron deficiency anemia I have canceled her blood work today Her results from blood work from last Friday show adequate iron replacement She has persistent anemia, likely component of anemia of chronic illness secondary to her malnutrition She does not need iron infusion or blood transfusion support She has appointment to see her primary care doctor again in about 6 weeks with likely repeat blood work For that reason, I will just follow on the lab results from her primary care doctor's office At the first sign of recurrent iron deficiency anemia again, I will schedule intravenous iron infusion She is in agreement with the plan of care  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   The total time spent in the appointment was 20 minutes encounter with patients including review of chart and various tests results, discussions about plan of care and coordination of care plan   All questions were answered. The patient knows to call the clinic with any problems, questions or concerns. No barriers to learning was detected.    Stacy Lark, MD 10/25/20219:05 AM  INTERVAL HISTORY: Stacy [name] 39 y.o. female returns for follow-up on component of iron deficiency anemia and possible GI bleed She is accompanied by her husband She feels well She does have blood work done at her primary care doctor's office last week which I have the opportunity to review The patient denies any recent signs or symptoms of  bleeding such as spontaneous epistaxis, hematuria or hematochezia. According to her husband, she is eating better and tolerated oral food intake along with nutritional supplement as needed.  She was started on Remeron last week to help with weight gain  SUMMARY OF HEMATOLOGIC HISTORY: Please see my original consult note from the hospital dated May 26, 2020 for further details. She has past medical history significant for anxiety, chronic intestinal pseudoobstruction, GERD, failure to thrive.  The patient was admitted to the hospital with dehydration, nausea, vomiting, abdominal pain.  Labs on admission showed a WBC of 14.1 hemoglobin 8.7, normal platelet count 334,000, total protein 4.0, albumin 1.5.  Additional labs performed on 10/1 showed a reticulocyte count percentage of 3.6%, absolute reticulocyte count of 92.1, immature reticulocyte fraction of 24%, vitamin B12 level was 514, folate 6.0, ferritin 147, iron 23, TIBC 85, percent saturation 27%.  Stool for occult blood was checked this admission on 10/4 and was positive.  The patient has received 2 units PRBCs in that admission and received a dose of Feraheme 510 mg IV on 05/17/2020. The patient was seen by gastroenterology and underwent upper endoscopy on 10/5 which showed a normal esophagus, diffuse moderately erythematous mucosa without bleeding found in the entire stomach which was biopsied, examined duodenum was normal.  Biopsy of stomach antrum showed mild reactive gastropathy, negative H. pylori, negative for malignancy and biopsy of the stomach body showed reactive gastropathy and no malignancy.  Review of the patient's prior CBC results available to me in epic show that she has been persistently anemic since August 2020.  Prior to that, she  had intermittent anemia dating back to 2013.  She has not noticed any bleeding such as epistaxis, hemoptysis, hematemesis, hematuria, hematochezia. She takes oral iron and states that stools are black  normally. She reports that she is on vitamin B12 injections monthly at her primary care provider's office. She has never received IV iron prior to this admission but has been taking oral iron twice a day, which she tolerated poorly due to severe constipation. Has only received 1 blood transfusion previously which was during her hospitalization in September 2021. The patient is married and has 2 children. Denies history of alcohol tobacco use. Hematology was asked see the patient to make recommendations regarding her anemia.  She never donated blood.  Up until recently, she denies the need for blood transfusion She has not had any menstruation for many months due to her malnourished state The patient denies any recent signs or symptoms of bleeding such as spontaneous epistaxis, hematuria or hematochezia. She has noticed some passage of dark stool in the past The patient take regular NSAID prior to admission  I have reviewed the past medical history, past surgical history, social history and family history with the patient and they are unchanged from previous note.  ALLERGIES:  is allergic to tetracyclines & related, erythromycin, raspberry, sulfa antibiotics, and sulfonamide derivatives.  MEDICATIONS:  Current Outpatient Medications  Medication Sig Dispense Refill  . acetaminophen (TYLENOL) 500 MG tablet Take 500 mg by mouth every 6 (six) hours as needed for headache (pain).    . cholecalciferol (VITAMIN D3) 25 MCG (1000 UNIT) tablet TAKE 2 TABLETS (2,000 UNITS TOTAL) BY MOUTH DAILY. (Patient taking differently: Take 1,000 Units by mouth 2 (two) times daily. ) 200 tablet 3  . COMPRO 25 MG suppository Place 25 mg rectally every 12 (twelve) hours as needed for nausea.    . cyanocobalamin (,VITAMIN B-12,) 1000 MCG/ML injection Inject 1,000 mcg into the muscle every 30 (thirty) days.    . famotidine (PEPCID) 20 MG tablet TAKE 1 TABLET BY MOUTH EVERYDAY AT BEDTIME (Patient taking differently: Take 20  mg by mouth at bedtime. ) 30 tablet 6  . fludrocortisone (FLORINEF) 0.1 MG tablet Take 1 tablet (0.1 mg total) by mouth 2 (two) times daily. 60 tablet 2  . fludrocortisone (FLORINEF) 0.1 MG tablet Take by mouth.    . midodrine (PROAMATINE) 10 MG tablet Take 1 tablet (10 mg total) by mouth 3 (three) times daily with meals. 90 tablet 2  . mirtazapine (REMERON) 15 MG tablet Take 1 tablet (15 mg total) by mouth at bedtime. 30 tablet 3  . montelukast (SINGULAIR) 10 MG tablet Take 10 mg by mouth daily.    . Multiple Vitamin (MULTIVITAMIN WITH MINERALS) TABS tablet Take 1 tablet by mouth daily.    . Nutritional Supplements (,FEEDING SUPPLEMENT, PROSOURCE PLUS) liquid Take 30 mLs by mouth 2 (two) times daily between meals.    Marland Kitchen omeprazole (PRILOSEC) 40 MG capsule TAKE 1 CAPSULE BY MOUTH EVERY DAY (Patient taking differently: Take 40 mg by mouth daily. ) 90 capsule 1  . ondansetron (ZOFRAN-ODT) 8 MG disintegrating tablet Take 8 mg by mouth 3 (three) times daily as needed for nausea/vomiting.    . polyethylene glycol (MIRALAX / GLYCOLAX) 17 g packet Take 17 g by mouth 3 (three) times daily. 14 each 0  . Probiotic Product (ALIGN PO) Take 1 capsule by mouth daily.    . Prucalopride Succinate (MOTEGRITY) 2 MG TABS Take 1 tablet (2 mg total) by mouth daily. [place]  tablet 1   No current facility-administered medications for this visit.     REVIEW OF SYSTEMS:   Constitutional: Denies fevers, chills or night sweats Eyes: Denies blurriness of vision Ears, nose, mouth, throat, and face: Denies mucositis or sore throat Respiratory: Denies cough, dyspnea or wheezes Cardiovascular: Denies palpitation, chest discomfort or lower extremity swelling Gastrointestinal:  Denies nausea, heartburn or change in bowel habits Skin: Denies abnormal skin rashes Lymphatics: Denies new lymphadenopathy or easy bruising Neurological:Denies numbness, tingling or new weaknesses Behavioral/Psych: Mood is stable, no new changes  All  other systems were reviewed with the patient and are negative.  PHYSICAL EXAMINATION: ECOG PERFORMANCE STATUS: 0 - Asymptomatic  Vitals:   06/09/20 0851  BP: 99/63  Pulse: 95  Resp: 18  Temp: (!) 97.3 F (36.3 C)  SpO2: 100%   Filed Weights   06/09/20 0851  Weight: 80 lb (36.3 kg)    GENERAL:alert, no distress and comfortable.  She looks thin NEURO: alert & oriented x 3 with fluent speech, no focal motor/sensory deficits  LABORATORY DATA:  I have reviewed the data as listed     Component Value Date/Time   NA 138 06/06/2020 1157   NA 137 01/15/2019 0000   K 3.5 06/06/2020 1157   CL 101 06/06/2020 1157   CO2 33 (H) 06/06/2020 1157   GLUCOSE 77 06/06/2020 1157   BUN 11 06/06/2020 1157   BUN 15 01/15/2019 0000   CREATININE 0.56 06/06/2020 1157   CALCIUM 8.0 (L) 06/06/2020 1157   PROT 4.9 (L) 06/06/2020 1157   ALBUMIN 3.0 (L) 06/06/2020 1157   AST 9 06/06/2020 1157   ALT 8 06/06/2020 1157   ALKPHOS 64 06/06/2020 1157   BILITOT 0.3 06/06/2020 1157   GFRNONAA NOT CALCULATED 05/26/2020 0525   GFRAA >60 05/20/2020 0502    No results found for: SPEP, UPEP  Lab Results  Component Value Date   WBC 5.6 06/06/2020   NEUTROABS 2.7 06/06/2020   HGB 10.5 (L) 06/06/2020   HCT 31.1 (L) 06/06/2020   MCV 88.9 06/06/2020   PLT 467.0 (H) 06/06/2020      Chemistry      Component Value Date/Time   NA 138 06/06/2020 1157   NA 137 01/15/2019 0000   K 3.5 06/06/2020 1157   CL 101 06/06/2020 1157   CO2 33 (H) 06/06/2020 1157   BUN 11 06/06/2020 1157   BUN 15 01/15/2019 0000   CREATININE 0.56 06/06/2020 1157   GLU 91 01/15/2019 0000      Component Value Date/Time   CALCIUM 8.0 (L) 06/06/2020 1157   ALKPHOS 64 06/06/2020 1157   AST 9 06/06/2020 1157   ALT 8 06/06/2020 1157   BILITOT 0.3 06/06/2020 1157

## 2020-06-17 ENCOUNTER — Telehealth: Payer: Self-pay | Admitting: Family Medicine

## 2020-06-17 DIAGNOSIS — I313 Pericardial effusion (noninflammatory): Secondary | ICD-10-CM

## 2020-06-17 DIAGNOSIS — D509 Iron deficiency anemia, unspecified: Secondary | ICD-10-CM

## 2020-06-17 DIAGNOSIS — Z79899 Other long term (current) drug therapy: Secondary | ICD-10-CM

## 2020-06-17 DIAGNOSIS — Z7952 Long term (current) use of systemic steroids: Secondary | ICD-10-CM

## 2020-06-17 DIAGNOSIS — I350 Nonrheumatic aortic (valve) stenosis: Secondary | ICD-10-CM

## 2020-06-17 DIAGNOSIS — Z9181 History of falling: Secondary | ICD-10-CM

## 2020-06-17 DIAGNOSIS — I951 Orthostatic hypotension: Secondary | ICD-10-CM | POA: Diagnosis not present

## 2020-06-17 DIAGNOSIS — K5989 Other specified functional intestinal disorders: Secondary | ICD-10-CM

## 2020-06-17 DIAGNOSIS — K219 Gastro-esophageal reflux disease without esophagitis: Secondary | ICD-10-CM

## 2020-06-17 DIAGNOSIS — E43 Unspecified severe protein-calorie malnutrition: Secondary | ICD-10-CM | POA: Diagnosis not present

## 2020-06-17 DIAGNOSIS — K5909 Other constipation: Secondary | ICD-10-CM

## 2020-06-17 DIAGNOSIS — F419 Anxiety disorder, unspecified: Secondary | ICD-10-CM

## 2020-06-17 DIAGNOSIS — Z87440 Personal history of urinary (tract) infections: Secondary | ICD-10-CM

## 2020-06-17 DIAGNOSIS — E876 Hypokalemia: Secondary | ICD-10-CM

## 2020-06-17 NOTE — Telephone Encounter (Signed)
Faxed to [facility]  home health agency [phone]

## 2020-06-17 NOTE — Telephone Encounter (Signed)
Home health orders placed in Dr. Virgil Benedict bin up front

## 2020-06-27 ENCOUNTER — Other Ambulatory Visit: Payer: Self-pay

## 2020-06-27 DIAGNOSIS — I9589 Other hypotension: Secondary | ICD-10-CM

## 2020-06-27 MED ORDER — MIDODRINE HCL 10 MG PO TABS: 10.0000 mg | ORAL_TABLET | Freq: Three times a day (TID) | ORAL | 1 refills | Status: DC

## 2020-06-27 MED ORDER — MIDODRINE HCL 10 MG PO TABS: 10.0000 mg | ORAL_TABLET | Freq: Three times a day (TID) | ORAL | 2 refills | Status: DC

## 2020-06-28 ENCOUNTER — Other Ambulatory Visit: Payer: Self-pay | Admitting: Family Medicine

## 2020-06-30 NOTE — Telephone Encounter (Signed)
Refill sent on 06/06/2020 30 tabs with 3 refills.  Request for 90 days Rx.

## 2020-07-18 ENCOUNTER — Other Ambulatory Visit (INDEPENDENT_AMBULATORY_CARE_PROVIDER_SITE_OTHER): Payer: 59

## 2020-07-18 ENCOUNTER — Ambulatory Visit (INDEPENDENT_AMBULATORY_CARE_PROVIDER_SITE_OTHER): Payer: 59 | Admitting: Family Medicine

## 2020-07-18 ENCOUNTER — Other Ambulatory Visit: Payer: Self-pay

## 2020-07-18 ENCOUNTER — Encounter: Payer: Self-pay | Admitting: Family Medicine

## 2020-07-18 VITALS — BP 100/80 | HR 90 | Temp 97.9°F | Resp 15 | Ht 63.0 in | Wt 84.6 lb

## 2020-07-18 DIAGNOSIS — K5989 Other specified functional intestinal disorders: Secondary | ICD-10-CM

## 2020-07-18 DIAGNOSIS — K219 Gastro-esophageal reflux disease without esophagitis: Secondary | ICD-10-CM

## 2020-07-18 DIAGNOSIS — E43 Unspecified severe protein-calorie malnutrition: Secondary | ICD-10-CM | POA: Diagnosis not present

## 2020-07-18 DIAGNOSIS — E539 Vitamin B deficiency, unspecified: Secondary | ICD-10-CM | POA: Diagnosis not present

## 2020-07-18 MED ORDER — CYANOCOBALAMIN 1000 MCG/ML IJ SOLN
1000.0000 ug | Freq: Once | INTRAMUSCULAR | Status: AC
  Administered 2020-07-18: 1000 ug via INTRAMUSCULAR

## 2020-07-18 NOTE — Patient Instructions (Addendum)
Schedule your complete physical for May Call the pharmacy and check on the Aciphex prescription (this would be in place of Omeprazole) Continue to eat regularly, drink plenty of fluids Continue to get daily walking to build your stamina Call with any questions or concerns Hang in there! Happy Holidays!

## 2020-07-18 NOTE — Progress Notes (Signed)
   Subjective:    Patient ID: Stacy [name], female    DOB: 1980/10/07, 39 y.o.   MRN: [id number]  HPI Severe protein calorie malnutrition- pt reports she is 'eating better, feeling much better'.  Done w/ PT.  'things are going well'.  Saw Nutrition for initial visit and has f/u on 12/10.  Pt reports she is slowly trying to implement some of her suggestions.  Husband feels things are improving.    Pseudo-obstruction- saw Dr Derrill Kay at [facility].  She has multiple upcoming tests to determine if dx is correct.  Pt and husband felt much more comfortable at this appt and felt that they were heard.  GERD- Dr Derrill Kay wanted her to switch to Aciphex but this was not clearly conveyed during appt and they were unaware of prescription.   Review of Systems For ROS see HPI   This visit occurred during the SARS-CoV-2 public health emergency.  Safety protocols were in place, including screening questions prior to the visit, additional usage of staff PPE, and extensive cleaning of exam room while observing appropriate contact time as indicated for disinfecting solutions.       Objective:   Physical Exam Vitals reviewed.  Constitutional:      General: She is not in acute distress.    Comments: cachectic  HENT:     Head: Normocephalic and atraumatic.  Cardiovascular:     Rate and Rhythm: Normal rate and regular rhythm.     Pulses: Normal pulses.     Heart sounds: Normal heart sounds. No murmur heard.   Pulmonary:     Effort: Pulmonary effort is normal. No respiratory distress.     Breath sounds: Normal breath sounds. No wheezing, rhonchi or rales.  Abdominal:     General: Abdomen is flat. There is no distension.     Tenderness: There is no abdominal tenderness. There is no guarding.  Musculoskeletal:     Right lower leg: No edema.     Left lower leg: No edema.  Skin:    General: Skin is warm and dry.  Neurological:     General: No focal deficit present.     Mental Status: She is alert and  oriented to person, place, and time.  Psychiatric:        Mood and Affect: Mood normal.        Behavior: Behavior normal.        Thought Content: Thought content normal.           Assessment & Plan:

## 2020-07-21 ENCOUNTER — Encounter: Payer: Self-pay | Admitting: Endocrinology

## 2020-07-21 ENCOUNTER — Other Ambulatory Visit: Payer: Self-pay

## 2020-07-21 ENCOUNTER — Telehealth: Payer: Self-pay

## 2020-07-21 ENCOUNTER — Ambulatory Visit (INDEPENDENT_AMBULATORY_CARE_PROVIDER_SITE_OTHER): Payer: 59 | Admitting: Endocrinology

## 2020-07-21 VITALS — BP 110/78 | HR 88 | Ht 63.0 in | Wt 84.8 lb

## 2020-07-21 DIAGNOSIS — E559 Vitamin D deficiency, unspecified: Secondary | ICD-10-CM | POA: Diagnosis not present

## 2020-07-21 DIAGNOSIS — N912 Amenorrhea, unspecified: Secondary | ICD-10-CM | POA: Diagnosis not present

## 2020-07-21 DIAGNOSIS — I959 Hypotension, unspecified: Secondary | ICD-10-CM | POA: Diagnosis not present

## 2020-07-21 LAB — CORTISOL: Cortisol, Plasma: 12.8 ug/dL

## 2020-07-21 MED ORDER — COSYNTROPIN 0.25 MG IJ SOLR
0.2500 mg | Freq: Once | INTRAMUSCULAR | Status: AC
  Administered 2020-07-21: 0.25 mg via INTRAMUSCULAR

## 2020-07-21 NOTE — Patient Instructions (Signed)
Blood tests are requested for you today.  We'll let you know about the results.  

## 2020-07-21 NOTE — Telephone Encounter (Signed)
Called regarding lab work with PCP. She said PCP did not think she needed labs repeated. Offered appt on 12/21 for lab and e-visit with Dr. Alvy Bimler on 12/22. She declined appt offered for those days. Scheduled per her request lab appt on 1/5 at 0930 and e-visit on 1/6 at 1120. She is aware of appt times.

## 2020-07-21 NOTE — Progress Notes (Signed)
Subjective:    Patient ID: Stacy [name], female    DOB: December 09, 1980, 39 y.o.   MRN: [id number]  HPI Pt is referred by Dr Birdie Riddle, for hypotension.  Pt was noted to have hypotension in early 2021.  She was rx'ed Florinef and midodrine.  These helped BP, but not sxs.  no h/o abdominal or brain injury.  No h/o cancer, thyroid problems, seizures, hypoglycemia, amyloidosis, tuberculosis, or diabetes.  she has never been on glucocorticoid therapy.  No h/o ketoconazole, rifampin, or dilantin.  No menses since early 2021.  She takes Vit-D supplement, uncertain dosage, but husb says prob 2x1000 units/d.   Past Medical History:  Diagnosis Date  . Anxiety   . Benign liver cyst   . Chronic intestinal pseudo-obstruction   . GERD (gastroesophageal reflux disease)   . History of UTI     Past Surgical History:  Procedure Laterality Date  . BIOPSY  05/20/2020   Procedure: BIOPSY;  Surgeon: Wonda Horner, MD;  Location: [facility];  Service: Endoscopy;;  . CESAREAN SECTION     twice 2010, 2013  . ESOPHAGOGASTRODUODENOSCOPY (EGD) WITH PROPOFOL N/A 05/20/2020   Procedure: ESOPHAGOGASTRODUODENOSCOPY (EGD) WITH PROPOFOL;  Surgeon: Wonda Horner, MD;  Location: [facility];  Service: Endoscopy;  Laterality: N/A;  Donato Heinz  ~2015    Social History   Socioeconomic History  . Marital status: Married    Spouse name: Not on file  . Number of children: 2  . Years of education: Not on file  . Highest education level: Not on file  Occupational History  . 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 Smoking status: Never Smoker  . Smokeless tobacco: Never Used  Vaping Use  . Vaping Use: Never used  Substance and Sexual Activity  . Alcohol use: No    Comment: socially   . Drug use: No  . Sexual activity: Yes    Birth control/protection: None  Other Topics Concern  . Not on file  Social History Narrative   Household: pt, husband , 2 children   2 boys: 2010, 2013   P2G2    Original  from [place], no foreign trips   Social Determinants of Radio broadcast assistant Strain: Not on [employer] Insecurity: Not on file  Transportation Needs: Not on file  Physical Activity: Not on file  Stress: Not on file  Social Connections: Not on file  Intimate Partner Violence: Not on file    Current Outpatient Medications on File Prior to Visit  Medication Sig Dispense Refill  . acetaminophen (TYLENOL) 500 MG tablet Take 500 mg by mouth every 6 (six) hours as needed for headache (pain).    . cholecalciferol (VITAMIN D3) 25 MCG (1000 UNIT) tablet TAKE 2 TABLETS (2,000 UNITS TOTAL) BY MOUTH DAILY. (Patient taking differently: Take 1,000 Units by mouth 2 (two) times daily. ) 200 tablet 3  . COMPRO 25 MG suppository Place 25 mg rectally every 12 (twelve) hours as needed for nausea.    . cyanocobalamin (,VITAMIN B-12,) 1000 MCG/ML injection Inject 1,000 mcg into the muscle every 30 (thirty) days.    . famotidine (PEPCID) 20 MG tablet TAKE 1 TABLET BY MOUTH EVERYDAY AT BEDTIME (Patient taking differently: Take 20 mg by mouth at bedtime. ) 30 tablet 6  . fludrocortisone (FLORINEF) 0.1 MG tablet Take 1 tablet (0.1 mg total) by mouth 2 (two) times daily. 60 tablet 2  . midodrine (PROAMATINE) 10 MG tablet  Take 1 tablet (10 mg total) by mouth 3 (three) times daily with meals. 90 tablet 1  . mirtazapine (REMERON) 15 MG tablet TAKE 1 TABLET BY MOUTH EVERYDAY AT BEDTIME 90 tablet 2  . montelukast (SINGULAIR) 10 MG tablet Take 10 mg by mouth daily.    . montelukast (SINGULAIR) 10 MG tablet Take by mouth.    . Multiple Vitamin (MULTIVITAMIN WITH MINERALS) TABS tablet Take 1 tablet by mouth daily.    . Nutritional Supplements (,FEEDING SUPPLEMENT, PROSOURCE PLUS) liquid Take 30 mLs by mouth 2 (two) times daily between meals.    . ondansetron (ZOFRAN-ODT) 8 MG disintegrating tablet Take 8 mg by mouth 3 (three) times daily as needed for nausea/vomiting.    . polyethylene glycol (MIRALAX /  GLYCOLAX) 17 g packet Take 17 g by mouth 3 (three) times daily. 14 each 0  . Probiotic Product (ALIGN PO) Take 1 capsule by mouth daily.    . Prucalopride Succinate (MOTEGRITY) 2 MG TABS Take 1 tablet (2 mg total) by mouth daily. 90 tablet 1  . RABEprazole (ACIPHEX) 20 MG tablet Take by mouth.    Marland Kitchen omeprazole (PRILOSEC) 40 MG capsule TAKE 1 CAPSULE BY MOUTH EVERY DAY (Patient not taking: Reported on 07/21/2020) 90 capsule 1   No current facility-administered medications on file prior to visit.    Allergies  Allergen Reactions  . Tetracyclines & Related Swelling    Swelling in spine; required spinal tap.   . Erythromycin Other (See Comments)    Swelling in spine; required spinal tap  . Raspberry Rash  . Sulfa Antibiotics Rash  . Sulfonamide Derivatives Rash    Reaction to cream    Family History  Problem Relation Age of Onset  . CAD Father   . Hypertension Father   . Diabetes Father   . Diabetes Brother   . Colon cancer Neg Hx   . Breast cancer Neg Hx   . Adrenal disorder Neg Hx     BP 110/78   Pulse 88   Ht 5\' 3"  (1.6 m)   Wt 84 lb 12.8 oz (38.5 kg)   SpO2 99%   BMI 15.02 kg/m    Review of Systems Denies fatigue, dizziness, vitiligo, and change in skin tone.  She has lost 50 lbs x 8 years.  She has heartburn, nausea, cold intolerance, and abd bloating.  She has regained a few lbs over the past few mos.     Objective:   Physical Exam VS: see vs page GEN: no distress HEAD: head: no deformity eyes: no periorbital swelling, no proptosis external nose and ears are normal NECK: supple, thyroid is not enlarged CHEST WALL: no deformity LUNGS: clear to auscultation CV: reg rate and rhythm, no murmur.  MUSCULOSKELETAL: gait is normal and steady EXTEMITIES: no deformity.  no leg edema NEURO:  cn 2-12 grossly intact.   readily moves all 4's.  sensation is intact to touch on all 4's SKIN:  Normal texture and temperature.  No rash or suspicious lesion is visible.    NODES:  None palpable at the neck PSYCH: alert, well-oriented.  Does not appear anxious nor depressed.     CT (2021) adrenals are normal.   Cortisol (inpatient)=26 Lab Results  Component Value Date   CREATININE 0.56 06/06/2020   BUN 11 06/06/2020   NA 138 06/06/2020   K 3.5 06/06/2020   CL 101 06/06/2020   CO2 33 (H) 06/06/2020   Lab Results  Component Value Date   TSH  1.08 01/01/2020   I have reviewed outside records, and summarized: Pt was noted to have hypotension, and referred here.  Hypotension was felt to be due to dehydration.  These were in turn due to GI probs.   ACTH stimulation test is done: baseline cortisol level=13 then Cosyntropin 250 mcg is given im 45 minutes later, cortisol level=28 (normal response)  Ca++=8.4.    Assessment & Plan:  Hypotension. Adrenal insuff is excluded.  I agree with florinef Hypocalcemia: prob due to hypoalbuminemia.  check labs  Patient Instructions  Blood tests are requested for you today.  We'll let you know about the results.

## 2020-07-21 NOTE — Telephone Encounter (Signed)
-----   Message from Heath Lark, MD sent at 07/21/2020  7:39 AM EST ----- Regarding: labs I was under the impression her primary care doctor will check labs on Friday I did not see any labs repeated I recommend repeat labs again either end of the month or early next year We can schedule labs end of next week and e-visit next day to review result

## 2020-07-22 LAB — PTH, INTACT AND CALCIUM
Calcium: 8.4 mg/dL — ABNORMAL LOW (ref 8.6–10.2)
PTH: 31 pg/mL (ref 14–64)

## 2020-07-22 LAB — CORTISOL: Cortisol, Plasma: 27.9 ug/dL

## 2020-07-22 LAB — PROLACTIN: Prolactin: 11.9 ng/mL

## 2020-07-22 LAB — LUTEINIZING HORMONE: LH: 2.09 m[IU]/mL

## 2020-07-22 LAB — FOLLICLE STIMULATING HORMONE: FSH: 8.7 m[IU]/mL

## 2020-07-22 LAB — VITAMIN D 25 HYDROXY (VIT D DEFICIENCY, FRACTURES): VITD: 25.57 ng/mL — ABNORMAL LOW (ref 30.00–100.00)

## 2020-07-22 NOTE — Assessment & Plan Note (Signed)
Ongoing issue.  GI doctor wanted her to switch to Aciphex but this was not clearly conveyed to them.  Reviewed that this would be in place of Omeprazole.  They are to pick up from pharmacy and take as directed.

## 2020-07-22 NOTE — Assessment & Plan Note (Signed)
Ongoing issue.  Pt is now following w/ [facility] and they are starting over down the diagnostic pathway to ensure that her dx is correct.  Pt and husband are both pleased w/ this.  Will follow along as work up progresses.

## 2020-07-22 NOTE — Assessment & Plan Note (Signed)
Ongoing issue.  Pt has had 1 visit w/ nutritionist and felt that she had helpful suggestions.  She has not yet implemented most of her recommendations.  Encouraged her to implement suggestions and to f/u as scheduled.  Will continue to follow along.

## 2020-07-26 LAB — ACTH: C206 ACTH: 19 pg/mL (ref 6–50)

## 2020-08-01 ENCOUNTER — Other Ambulatory Visit: Payer: Self-pay | Admitting: Family Medicine

## 2020-08-18 ENCOUNTER — Telehealth: Payer: Self-pay | Admitting: Hematology and Oncology

## 2020-08-18 NOTE — Telephone Encounter (Signed)
Rescheduled appointment per 1/3 schedule message. Patient is aware of changes. 

## 2020-08-19 ENCOUNTER — Telehealth: Payer: Self-pay | Admitting: Family Medicine

## 2020-08-19 ENCOUNTER — Ambulatory Visit: Payer: 59

## 2020-08-19 NOTE — Telephone Encounter (Signed)
Pt called in stating that her husband tested positive for covid on 08/15/20, she states that she is not having any symptoms and is scheduled for do a PCR test on Friday and get her covid booster. She wanted to know if she can still get the covid booster.  Please advise

## 2020-08-20 ENCOUNTER — Other Ambulatory Visit: Payer: 59

## 2020-08-20 NOTE — Telephone Encounter (Signed)
I would wait on the booster until she knows her test is negative

## 2020-08-20 NOTE — Telephone Encounter (Signed)
Called patient in reference to PCP recommendations. Patient voiced understanding. 

## 2020-08-21 ENCOUNTER — Telehealth: Payer: 59 | Admitting: Hematology and Oncology

## 2020-08-26 ENCOUNTER — Ambulatory Visit: Payer: 59 | Admitting: Cardiovascular Disease

## 2020-08-26 ENCOUNTER — Telehealth: Payer: Self-pay | Admitting: Cardiovascular Disease

## 2020-08-26 NOTE — Telephone Encounter (Signed)
Spoke with patient to reschedule her 08/26/20 appointment with Dr. Hilliard Clark to Monday 09/08/20 at 11:40 am---patient voiced her understanding.

## 2020-09-05 ENCOUNTER — Other Ambulatory Visit: Payer: Self-pay

## 2020-09-05 ENCOUNTER — Ambulatory Visit (INDEPENDENT_AMBULATORY_CARE_PROVIDER_SITE_OTHER): Payer: 59 | Admitting: Emergency Medicine

## 2020-09-05 DIAGNOSIS — E539 Vitamin B deficiency, unspecified: Secondary | ICD-10-CM | POA: Diagnosis not present

## 2020-09-05 NOTE — Progress Notes (Signed)
Stacy [name] is a 40 y.o. female presents to the office today for Cyanocobalamin 1000 mcg/ml injections, per physician's orders. Cyanocobalamin (med), 54ml (dose),  IM (route) was administered Right deltoid (location) today. Patient tolerated injection. Patient next injection in one month.  Leonidas Romberg, CMA

## 2020-09-08 ENCOUNTER — Ambulatory Visit (INDEPENDENT_AMBULATORY_CARE_PROVIDER_SITE_OTHER): Payer: 59 | Admitting: Cardiovascular Disease

## 2020-09-08 ENCOUNTER — Other Ambulatory Visit: Payer: Self-pay

## 2020-09-08 ENCOUNTER — Encounter: Payer: Self-pay | Admitting: Cardiovascular Disease

## 2020-09-08 VITALS — BP 112/80 | HR 86 | Ht 63.0 in | Wt 87.4 lb

## 2020-09-08 DIAGNOSIS — E43 Unspecified severe protein-calorie malnutrition: Secondary | ICD-10-CM

## 2020-09-08 DIAGNOSIS — I951 Orthostatic hypotension: Secondary | ICD-10-CM

## 2020-09-08 DIAGNOSIS — I959 Hypotension, unspecified: Secondary | ICD-10-CM

## 2020-09-08 NOTE — Progress Notes (Signed)
Cardiology Office Note   Date:  09/08/2020   ID:  Kaidance Pantoja, DOB [birth year], MRN [id number]  PCP:  [name], MD  Cardiologist:   Skeet Latch, MD   No chief complaint on file.  History of Present Illness: Nayra Coury is a 40 y.o. female  anxiety, unintentional weight loss, failure to thrive, GERD, and chronic intestinal pseudo-obstruction who presents for follow up.  She was seen in the hospital 05/2020 with profound orthostasis.  Ms. Mian reports 7 years of unintentional weight loss.  Has had episodes of nausea and vomiting and inability to eat.  She continues to lose weight.  She was she was hospitalized for intestinal pseudo-obstruction.  This hospitalization she has been profoundly hypotensive and orthostatic, with the lowest recorded blood pressure 56/44.  She did receive several liters of IV fluids upon admission.  She has not received any in the last several days.  Her hospitalist team started her on midodrine and increase the dose to 10 mg 3 times daily.  She is also on fludrocortisone.  An echocardiogram that admission revealed LVEF 50 to 55% with no other significant abnormalities.  Her labs have been notable for mild anemia.  Hemoglobin slowly downtrending to 9.2.  Creatinine is low at 0.34.  Albumin is 2.7.  This was an improvement from 1.4 on admission.  Thyroid was normal 12/2019.  They plan to check for cortisol levels but were unable given her recent steroid use.  Cardiology was consulted for persistent orthostatic hypotension despite TED hose and fluid repletion.  She reports that lately she has been eating and drinking well.  She has no more nausea or vomiting.  She has not had any lower extremity edema, orthopnea, or PND.  She denies any chest pain and her breathing is stable.  Lately she has been doing much better.  She is working with a GI at [facility] and her PPI was changed.  She no longer has nausea.  She is able to eat regularly.  Her intake is  still somewhat limited.  She drinks about 48 oz of fluids daily.  She saw endocrinology and her cortisol stim test was normal.  She is able to eat what she wants and is slowly gaining weight.  She has no nausea, lightheadedness or dizziness.  She doesn't check her BP at home.  She has rare ankle edema and no supine headache.    Past Medical History:  Diagnosis Date  . Anxiety   . Benign liver cyst   . Chronic intestinal pseudo-obstruction   . GERD (gastroesophageal reflux disease)   . History of UTI     Past Surgical History:  Procedure Laterality Date  . BIOPSY  05/20/2020   Procedure: BIOPSY;  Surgeon: Wonda Horner, MD;  Location: [facility];  Service: Endoscopy;;  . CESAREAN SECTION     twice 2010, 2013  . ESOPHAGOGASTRODUODENOSCOPY (EGD) WITH PROPOFOL N/A 05/20/2020   Procedure: ESOPHAGOGASTRODUODENOSCOPY (EGD) WITH PROPOFOL;  Surgeon: Wonda Horner, MD;  Location: [facility];  Service: Endoscopy;  Laterality: N/A;  Donato Heinz  ~2015     Current Outpatient Medications  Medication Sig Dispense Refill  . acetaminophen (TYLENOL) 500 MG tablet Take 500 mg by mouth every 6 (six) hours as needed for headache (pain).    . cholecalciferol (VITAMIN D3) 25 MCG (1000 UNIT) tablet TAKE 2 TABLETS (2,000 UNITS TOTAL) BY MOUTH DAILY. 200 tablet 3  . COMPRO 25 MG suppository Place 25 mg rectally every 12 (  twelve) hours as needed for nausea.    . cyanocobalamin (,VITAMIN B-12,) 1000 MCG/ML injection Inject 1,000 mcg into the muscle every 30 (thirty) days.    . famotidine (PEPCID) 20 MG tablet TAKE 1 TABLET BY MOUTH EVERYDAY AT BEDTIME 90 tablet 2  . fludrocortisone (FLORINEF) 0.1 MG tablet Take 1 tablet (0.1 mg total) by mouth 2 (two) times daily. 60 tablet 2  . Magnesium 500 MG TABS 1 tablet with a meal    . metroNIDAZOLE (FLAGYL) 500 MG tablet Take by mouth.    . midodrine (PROAMATINE) 10 MG tablet Take 1 tablet (10 mg total) by mouth 3 (three) times daily with meals. 90 tablet 1  .  mirtazapine (REMERON) 15 MG tablet TAKE 1 TABLET BY MOUTH EVERYDAY AT BEDTIME 90 tablet 2  . montelukast (SINGULAIR) 10 MG tablet Take 10 mg by mouth daily.    . montelukast (SINGULAIR) 10 MG tablet Take by mouth.    . Multiple Vitamin (MULTIVITAMIN WITH MINERALS) TABS tablet Take 1 tablet by mouth daily.    . Nutritional Supplements (,FEEDING SUPPLEMENT, PROSOURCE PLUS) liquid Take 30 mLs by mouth 2 (two) times daily between meals.    Marland Kitchen omeprazole (PRILOSEC) 40 MG capsule TAKE 1 CAPSULE BY MOUTH EVERY DAY 90 capsule 1  . ondansetron (ZOFRAN-ODT) 8 MG disintegrating tablet Take 8 mg by mouth 3 (three) times daily as needed for nausea/vomiting.    . polyethylene glycol (MIRALAX / GLYCOLAX) 17 g packet Take 17 g by mouth 3 (three) times daily. 14 each 0  . RABEprazole (ACIPHEX) 20 MG tablet Take by mouth.     No current facility-administered medications for this visit.    Allergies:   Tetracyclines & related, Erythromycin, Raspberry, Sulfa antibiotics, and Sulfonamide derivatives    Social History:  The patient  reports that she has never smoked. She has never used smokeless tobacco. She reports that she does not drink alcohol and does not use drugs.   Family History:  The patient's family history includes CAD in her father; Diabetes in her brother and father; Hypertension in her father.    ROS:  Please see the history of present illness.   Otherwise, review of systems are positive for none.   All other systems are reviewed and negative.    PHYSICAL EXAM: VS:  BP 112/80   Pulse 86   Ht 5\' 3"  (1.6 m)   Wt 87 lb 6.4 oz (39.6 kg)   BMI 15.48 kg/m  , BMI Body mass index is 15.48 kg/m. GENERAL:  Well appearing HEENT:  Pupils equal round and reactive, fundi not visualized, oral mucosa unremarkable NECK:  No jugular venous distention, waveform within normal limits, carotid upstroke brisk and symmetric, no bruits LUNGS:  Clear to auscultation bilaterally HEART:  RRR.  PMI not displaced or  sustained,S1 and S2 within normal limits, no S3, no S4, no clicks, no rubs, no murmurs ABD:  Flat, positive bowel sounds normal in frequency in pitch, no bruits, no rebound, no guarding, no midline pulsatile mass, no hepatomegaly, no splenomegaly EXT:  2 plus pulses throughout, no edema, no cyanosis no clubbing SKIN:  No rashes no nodules NEURO:  Cranial nerves II through XII grossly intact, motor grossly intact throughout PSYCH:  Cognitively intact, oriented to person place and time   EKG:  EKG is ordered today. The ekg ordered today demonstrates sinus rhythm.  Rate 86 bpm.    Echo 05/24/20:  1. Grossly, no wall motiion abnormalities noted. Left ventricular  ejection  fraction, by estimation, is 50 to 55%. The left ventricle has low  normal function. Left ventricular endocardial border not optimally defined  to evaluate regional wall motion.  Left ventricular diastolic parameters were normal.  2. Right ventricular systolic function is normal. The right ventricular  size is normal.  3. The mitral valve is grossly normal. No evidence of mitral valve  regurgitation.  4. The aortic valve was not well visualized. Aortic valve regurgitation  is trivial.  5. The inferior vena cava is normal in size with greater than 50%  respiratory variability, suggesting right atrial pressure of 3 mmHg.   Recent Labs: 01/01/2020: TSH 1.08 06/06/2020: ALT 8; BUN 11; Creatinine, Ser 0.56; Hemoglobin 10.5; Magnesium 1.4; Platelets 467.0; Potassium 3.5; Sodium 138    Lipid Panel    Component Value Date/Time   CHOL 123 01/01/2020 1038   TRIG 60.0 01/01/2020 1038   HDL 50.40 01/01/2020 1038   CHOLHDL 2 01/01/2020 1038   VLDL 12.0 01/01/2020 1038   LDLCALC 61 01/01/2020 1038      Wt Readings from Last 3 Encounters:  09/08/20 87 lb 6.4 oz (39.6 kg)  07/21/20 84 lb 12.8 oz (38.5 kg)  07/18/20 84 lb 9.6 oz (38.4 kg)      ASSESSMENT AND PLAN:  # Orthostatic hypotension: Mrs. Pelaez had  severe, persistent orthostatic hypotension due to malnutrition.  She is eating more and has no nausea, but her intake is still suboptimal.  Recommended that she gradually increase fluid intake to a goal of 2.5 to 3L daily. Her BP is marginal on midodrine and florinef.  I would not stop them now or she is likely to have recurrent symptoms.  She will keep adding salt to her foods.  Continue on Florinef and midodrine at this time.  She can start trying to not take her noon dose of midodrine and see how she does.  Overall, I would suggest that she gained some more weight and increase her fluid intake before cutting back on these medications.  Structurally her heart is normal.  Her cortisol stim was normal.  Her hypotension is due to poor oral intake and not an underlying endocrine or cardiovascular issue.  Renal function, sodium and potassium have been normal.      Current medicines are reviewed at length with the patient today.  The patient does not have concerns regarding medicines.  The following changes have been made:  no change  Labs/ tests ordered today include:  No orders of the defined types were placed in this encounter.    Disposition:   FU with Malon Branton C. Oval Linsey, MD, [facility] in 6 months.     Signed, Graeme Menees C. Oval Linsey, MD, [facility]  09/08/2020 12:40 PM    [facility]

## 2020-09-08 NOTE — Patient Instructions (Signed)
Medication Instructions:  Your physician recommends that you continue on your current medications as directed. Please refer to the Current Medication list given to you today.  *If you need a refill on your cardiac medications before your next appointment, please call your pharmacy*  Lab Work: NONE   Testing/Procedures: NONE   Follow-Up: At [facility], you and your health needs are our priority.  As part of our continuing mission to provide you with exceptional heart care, we have created designated Provider Care Teams.  These Care Teams include your primary Cardiologist (physician) and Advanced Practice Providers (APPs -  Physician Assistants and Nurse Practitioners) who all work together to provide you with the care you need, when you need it.  We recommend signing up for the patient portal called "MyChart".  Sign up information is provided on this After Visit Summary.  MyChart is used to connect with patients for Virtual Visits (Telemedicine).  Patients are able to view lab/test results, encounter notes, upcoming appointments, etc.  Non-urgent messages can be sent to your provider as well.   To learn more about what you can do with MyChart, go to [email or web address].    Your next appointment:   6 month(s)  You will receive a reminder letter in the mail two months in advance. If you don't receive a letter, please call our office to schedule the follow-up appointment.  The format for your next appointment:   In Person  Provider:   You may see Tiffany Piperton, MD or one of the following Advanced Practice Providers on your designated Care Team:    Luke Kilroy, PA-C  Callie Goodrich, PA-C  Jesse Cleaver, FNP     

## 2020-09-10 MED ORDER — CYANOCOBALAMIN 1000 MCG/ML IJ SOLN
1000.0000 ug | Freq: Once | INTRAMUSCULAR | Status: AC
  Administered 2020-09-05: 1000 ug via INTRAMUSCULAR

## 2020-09-10 NOTE — Addendum Note (Signed)
Addended by: Leonidas Romberg on: 09/10/2020 10:17 AM   Modules accepted: Orders

## 2020-09-12 ENCOUNTER — Other Ambulatory Visit: Payer: Self-pay | Admitting: Hematology and Oncology

## 2020-09-12 DIAGNOSIS — D509 Iron deficiency anemia, unspecified: Secondary | ICD-10-CM

## 2020-09-15 ENCOUNTER — Inpatient Hospital Stay: Payer: 59 | Attending: Hematology and Oncology

## 2020-09-15 ENCOUNTER — Other Ambulatory Visit: Payer: Self-pay

## 2020-09-15 DIAGNOSIS — D509 Iron deficiency anemia, unspecified: Secondary | ICD-10-CM | POA: Diagnosis present

## 2020-09-15 LAB — IRON AND TIBC
Iron: 78 ug/dL (ref 41–142)
Saturation Ratios: 30 % (ref 21–57)
TIBC: 259 ug/dL (ref 236–444)
UIBC: 181 ug/dL (ref 120–384)

## 2020-09-15 LAB — CBC WITH DIFFERENTIAL/PLATELET
Abs Immature Granulocytes: 0.02 10*3/uL (ref 0.00–0.07)
Basophils Absolute: 0 10*3/uL (ref 0.0–0.1)
Basophils Relative: 1 %
Eosinophils Absolute: 0.1 10*3/uL (ref 0.0–0.5)
Eosinophils Relative: 1 %
HCT: 34.8 % — ABNORMAL LOW (ref 36.0–46.0)
Hemoglobin: 11.1 g/dL — ABNORMAL LOW (ref 12.0–15.0)
Immature Granulocytes: 0 %
Lymphocytes Relative: 37 %
Lymphs Abs: 2.5 10*3/uL (ref 0.7–4.0)
MCH: 30.9 pg (ref 26.0–34.0)
MCHC: 31.9 g/dL (ref 30.0–36.0)
MCV: 96.9 fL (ref 80.0–100.0)
Monocytes Absolute: 0.7 10*3/uL (ref 0.1–1.0)
Monocytes Relative: 11 %
Neutro Abs: 3.3 10*3/uL (ref 1.7–7.7)
Neutrophils Relative %: 50 %
Platelets: 334 10*3/uL (ref 150–400)
RBC: 3.59 MIL/uL — ABNORMAL LOW (ref 3.87–5.11)
RDW: 13.7 % (ref 11.5–15.5)
WBC: 6.6 10*3/uL (ref 4.0–10.5)
nRBC: 0 % (ref 0.0–0.2)

## 2020-09-15 LAB — FERRITIN: Ferritin: 177 ng/mL (ref 11–307)

## 2020-09-15 LAB — SEDIMENTATION RATE: Sed Rate: 30 mm/hr — ABNORMAL HIGH (ref 0–22)

## 2020-09-16 ENCOUNTER — Encounter: Payer: Self-pay | Admitting: Hematology and Oncology

## 2020-09-16 ENCOUNTER — Telehealth (HOSPITAL_BASED_OUTPATIENT_CLINIC_OR_DEPARTMENT_OTHER): Payer: 59 | Admitting: Hematology and Oncology

## 2020-09-16 DIAGNOSIS — D509 Iron deficiency anemia, unspecified: Secondary | ICD-10-CM | POA: Diagnosis not present

## 2020-09-16 DIAGNOSIS — E2740 Unspecified adrenocortical insufficiency: Secondary | ICD-10-CM | POA: Insufficient documentation

## 2020-09-16 DIAGNOSIS — R627 Adult failure to thrive: Secondary | ICD-10-CM

## 2020-09-16 MED ORDER — FLUDROCORTISONE ACETATE 0.1 MG PO TABS: 0.1000 mg | ORAL_TABLET | Freq: Two times a day (BID) | ORAL | 1 refills | Status: DC

## 2020-09-16 NOTE — Assessment & Plan Note (Signed)
She has history of multifactorial anemia, component of iron deficiency and B12 deficiency I reviewed test results with her Her anemia is dramatically improved She does not need further intravenous iron infusion She will continue B12 injection through her primary care doctor She will call me in the future if needed but for now, I recommend close monitoring and follow-up with her primary care doctor

## 2020-09-16 NOTE — Progress Notes (Signed)
HEMATOLOGY-ONCOLOGY ELECTRONIC VISIT PROGRESS NOTE  Patient Care Team: [name], MD as PCP - General (Family Medicine) Skeet Latch, MD as PCP - Cardiology (Cardiology) Wilford Corner, MD as Consulting Physician (Gastroenterology) Allyn Kenner, DO as Consulting Physician (Obstetrics and Gynecology) Elige Ko, [place] as Consulting Physician (Gastroenterology) Daphane Shepherd, Nicholos Johns, MD as Referring Physician (Gastroenterology)  I connected with by [facility] video conference and verified that I am speaking with the correct person using two identifiers.  I discussed the limitations, risks, security and privacy concerns of performing an evaluation and management service by EPIC and the availability of in person appointments.  I also discussed with the patient that there may be a patient responsible charge related to this service. The patient expressed understanding and agreed to proceed.   ASSESSMENT & PLAN:  Iron deficiency anemia She has history of multifactorial anemia, component of iron deficiency and B12 deficiency I reviewed test results with her Her anemia is dramatically improved She does not need further intravenous iron infusion She will continue B12 injection through her primary care doctor She will call me in the future if needed but for now, I recommend close monitoring and follow-up with her primary care doctor  Failure to thrive in adult She had history of failure to thrive with severe malnutrition and weight loss Since she had antibiotics for bacterial overgrowth and changes in medication by gastroenterologist, she is eating better and gaining weight I would defer future management to her GI physician  Adrenal insufficiency ([place]) She was diagnosed with adrenal insufficiency in the hospital and her last prescription of Florinef was prescribed by hospitalist She was not able to get that refilled I refilled the prescription for her but recommend the  patient to follow-up with her primary care doctor to see if she still needed in the future   No orders of the defined types were placed in this encounter.   INTERVAL HISTORY: Please see below for problem oriented charting. Since last time I saw her, she felt better She had extensive GI evaluation at [facility]; I have reviewed outside reports and test results She was diagnosed with intestinal bacterial overgrowth and was prescribed antibiotics She felt much better She is eating well and gaining weight Her energy level has improved The patient denies any recent signs or symptoms of bleeding such as spontaneous epistaxis, hematuria or hematochezia. She had trouble refilling her prescription of Florinef and requested me to refill it if possible  SUMMARY OF ONCOLOGIC HISTORY: Please see my original consult note from the hospital dated May 26, 2020 for further details. She has past medical history significant for anxiety, chronic intestinal pseudoobstruction, GERD, failure to thrive.  The patient was admitted to the hospital with dehydration, nausea, vomiting, abdominal pain.  Labs on admission showed a WBC of 14.1 hemoglobin 8.7, normal platelet count 334,000, total protein 4.0, albumin 1.5.  Additional labs performed on 10/1 showed a reticulocyte count percentage of 3.6%, absolute reticulocyte count of 92.1, immature reticulocyte fraction of 24%, vitamin B12 level was 514, folate 6.0, ferritin 147, iron 23, TIBC 85, percent saturation 27%.  Stool for occult blood was checked this admission on 10/4 and was positive.  The patient has received 2 units PRBCs in that admission and received a dose of Feraheme 510 mg IV on 05/17/2020. The patient was seen by gastroenterology and underwent upper endoscopy on 10/5 which showed a normal esophagus, diffuse moderately erythematous mucosa without bleeding found in the entire stomach which was biopsied, examined duodenum  was normal.  Biopsy of stomach antrum  showed mild reactive gastropathy, negative H. pylori, negative for malignancy and biopsy of the stomach body showed reactive gastropathy and no malignancy.  Review of the patient's prior CBC results available to me in epic show that she has been persistently anemic since August 2020.  Prior to that, she had intermittent anemia dating back to 2013.  She has not noticed any bleeding such as epistaxis, hemoptysis, hematemesis, hematuria, hematochezia. She takes oral iron and states that stools are black normally. She reports that she is on vitamin B12 injections monthly at her primary care provider's office. She has never received IV iron prior to this admission but has been taking oral iron twice a day, which she tolerated poorly due to severe constipation. Has only received 1 blood transfusion previously which was during her hospitalization in September 2021. The patient is married and has 2 children. Denies history of alcohol tobacco use. Hematology was asked see the patient to make recommendations regarding her anemia.  She never donated blood.  Up until recently, she denies the need for blood transfusion She has not had any menstruation for many months due to her malnourished state The patient denies any recent signs or symptoms of bleeding such as spontaneous epistaxis, hematuria or hematochezia. She has noticed some passage of dark stool in the past The patient take regular NSAID prior to admission The patient received intravenous iron and B12 injection in 2021  REVIEW OF SYSTEMS:   Constitutional: Denies fevers, chills or abnormal weight loss Eyes: Denies blurriness of vision Ears, nose, mouth, throat, and face: Denies mucositis or sore throat Respiratory: Denies cough, dyspnea or wheezes Cardiovascular: Denies palpitation, chest discomfort Gastrointestinal:  Denies nausea, heartburn or change in bowel habits Skin: Denies abnormal skin rashes Lymphatics: Denies new lymphadenopathy or  easy bruising Neurological:Denies numbness, tingling or new weaknesses Behavioral/Psych: Mood is stable, no new changes  Extremities: No lower extremity edema All other systems were reviewed with the patient and are negative.  I have reviewed the past medical history, past surgical history, social history and family history with the patient and they are unchanged from previous note.  ALLERGIES:  is allergic to tetracyclines & related, erythromycin, raspberry, sulfa antibiotics, and sulfonamide derivatives.  MEDICATIONS:  Current Outpatient Medications  Medication Sig Dispense Refill  . acetaminophen (TYLENOL) 500 MG tablet Take 500 mg by mouth every 6 (six) hours as needed for headache (pain).    . cholecalciferol (VITAMIN D3) 25 MCG (1000 UNIT) tablet TAKE 2 TABLETS (2,000 UNITS TOTAL) BY MOUTH DAILY. 200 tablet 3  . COMPRO 25 MG suppository Place 25 mg rectally every 12 (twelve) hours as needed for nausea.    . cyanocobalamin (,VITAMIN B-12,) 1000 MCG/ML injection Inject 1,000 mcg into the muscle every 30 (thirty) days.    . famotidine (PEPCID) 20 MG tablet TAKE 1 TABLET BY MOUTH EVERYDAY AT BEDTIME 90 tablet 2  . fludrocortisone (FLORINEF) 0.1 MG tablet Take 1 tablet (0.1 mg total) by mouth 2 (two) times daily. 120 tablet 1  . Magnesium 500 MG TABS 1 tablet with a meal    . metroNIDAZOLE (FLAGYL) 500 MG tablet Take by mouth.    . midodrine (PROAMATINE) 10 MG tablet Take 1 tablet (10 mg total) by mouth 3 (three) times daily with meals. 90 tablet 1  . mirtazapine (REMERON) 15 MG tablet TAKE 1 TABLET BY MOUTH EVERYDAY AT BEDTIME 90 tablet 2  . montelukast (SINGULAIR) 10 MG tablet Take 10 mg by  mouth daily.    . Multiple Vitamin (MULTIVITAMIN WITH MINERALS) TABS tablet Take 1 tablet by mouth daily.    . Nutritional Supplements (,FEEDING SUPPLEMENT, PROSOURCE PLUS) liquid Take 30 mLs by mouth 2 (two) times daily between meals.    Marland Kitchen omeprazole (PRILOSEC) 40 MG capsule TAKE 1 CAPSULE BY MOUTH  EVERY DAY 90 capsule 1  . ondansetron (ZOFRAN-ODT) 8 MG disintegrating tablet Take 8 mg by mouth 3 (three) times daily as needed for nausea/vomiting.    . polyethylene glycol (MIRALAX / GLYCOLAX) 17 g packet Take 17 g by mouth 3 (three) times daily. 14 each 0  . RABEprazole (ACIPHEX) 20 MG tablet Take by mouth.     No current facility-administered medications for this visit.    PHYSICAL EXAMINATION: ECOG PERFORMANCE STATUS: 1 - Symptomatic but completely ambulatory  LABORATORY DATA:  I have reviewed the data as listed CMP Latest Ref Rng & Units 07/21/2020 06/06/2020 05/26/2020  Glucose 70 - 99 mg/dL - 77 97  BUN 6 - 23 mg/dL - 11 18  Creatinine 0.40 - 1.20 mg/dL - 0.56 <0.30(L)  Sodium 135 - 145 mEq/L - 138 136  Potassium 3.5 - 5.1 mEq/L - 3.5 3.8  Chloride 96 - 112 mEq/L - 101 102  CO2 19 - 32 mEq/L - 33(H) 28  Calcium 8.6 - 10.2 mg/dL 8.4(L) 8.0(L) 8.4(L)  Total Protein 6.0 - 8.3 g/dL - 4.9(L) -  Total Bilirubin 0.2 - 1.2 mg/dL - 0.3 -  Alkaline Phos 39 - 117 U/L - 64 -  AST 0 - 37 U/L - 9 -  ALT 0 - 35 U/L - 8 -    Lab Results  Component Value Date   WBC 6.6 09/15/2020   HGB 11.1 (L) 09/15/2020   HCT 34.8 (L) 09/15/2020   MCV 96.9 09/15/2020   PLT 334 09/15/2020   NEUTROABS 3.3 09/15/2020     I discussed the assessment and treatment plan with the patient. The patient was provided an opportunity to ask questions and all were answered. The patient agreed with the plan and demonstrated an understanding of the instructions. The patient was advised to call back or seek an in-person evaluation if the symptoms worsen or if the condition fails to improve as anticipated.    I spent 20 minutes for the appointment reviewing test results, discuss management and coordination of care.  Heath Lark, MD 09/16/2020 12:17 PM

## 2020-09-16 NOTE — Assessment & Plan Note (Signed)
She had history of failure to thrive with severe malnutrition and weight loss Since she had antibiotics for bacterial overgrowth and changes in medication by gastroenterologist, she is eating better and gaining weight I would defer future management to her GI physician

## 2020-09-16 NOTE — Assessment & Plan Note (Signed)
She was diagnosed with adrenal insufficiency in the hospital and her last prescription of Florinef was prescribed by hospitalist She was not able to get that refilled I refilled the prescription for her but recommend the patient to follow-up with her primary care doctor to see if she still needed in the future

## 2020-09-25 ENCOUNTER — Encounter: Payer: Self-pay | Admitting: Family Medicine

## 2020-09-25 ENCOUNTER — Other Ambulatory Visit: Payer: Self-pay

## 2020-09-25 ENCOUNTER — Ambulatory Visit: Payer: 59 | Admitting: Family Medicine

## 2020-09-25 VITALS — BP 121/78 | HR 66 | Temp 98.6°F | Resp 15 | Ht 63.0 in | Wt 88.6 lb

## 2020-09-25 DIAGNOSIS — R300 Dysuria: Secondary | ICD-10-CM | POA: Diagnosis not present

## 2020-09-25 DIAGNOSIS — N898 Other specified noninflammatory disorders of vagina: Secondary | ICD-10-CM

## 2020-09-25 DIAGNOSIS — R634 Abnormal weight loss: Secondary | ICD-10-CM | POA: Insufficient documentation

## 2020-09-25 DIAGNOSIS — K5904 Chronic idiopathic constipation: Secondary | ICD-10-CM | POA: Insufficient documentation

## 2020-09-25 DIAGNOSIS — K769 Liver disease, unspecified: Secondary | ICD-10-CM | POA: Insufficient documentation

## 2020-09-25 DIAGNOSIS — R350 Frequency of micturition: Secondary | ICD-10-CM | POA: Diagnosis not present

## 2020-09-25 DIAGNOSIS — F419 Anxiety disorder, unspecified: Secondary | ICD-10-CM | POA: Insufficient documentation

## 2020-09-25 LAB — POCT URINALYSIS DIPSTICK OB
Blood, UA: -10
Glucose, UA: NEGATIVE
Ketones, UA: -5
Nitrite, UA: NEGATIVE
Spec Grav, UA: 1.015 (ref 1.010–1.025)
Urobilinogen, UA: 0.2 E.U./dL
pH, UA: 7 (ref 5.0–8.0)

## 2020-09-25 MED ORDER — CEPHALEXIN 500 MG PO CAPS: 500.0000 mg | ORAL_CAPSULE | Freq: Two times a day (BID) | ORAL | 0 refills | Status: AC

## 2020-09-25 NOTE — Patient Instructions (Signed)
Follow up as needed or as scheduled START the Cephalexin twice daily- take w/ food Drink plenty of fluids Call with any questions or concerns Stay Safe!  Stay Healthy!

## 2020-09-25 NOTE — Progress Notes (Signed)
   Subjective:    Patient ID: Stacy [name], female    DOB: [birth year], 40 y.o.   MRN: [id number]  HPI ? UTI- pt reports sxs started w/ vaginal itching.  Got OTC Monistat and used x3 nights.  Has subsequently developed some burning.  + frequency.  Some urgency.  No fever, N/V.  + LBP.  No longer having itching.  Denies d/c.   Review of Systems For ROS see HPI   This visit occurred during the SARS-CoV-2 public health emergency.  Safety protocols were in place, including screening questions prior to the visit, additional usage of staff PPE, and extensive cleaning of exam room while observing appropriate contact time as indicated for disinfecting solutions.     Objective:   Physical Exam Vitals reviewed.  Constitutional:      General: She is not in acute distress.    Appearance: She is well-developed and well-nourished.  Abdominal:     General: There is no distension.     Palpations: Abdomen is soft.     Tenderness: There is no abdominal tenderness (no suprapubic or CVA tenderness).  Skin:    General: Skin is warm and dry.  Neurological:     General: No focal deficit present.     Mental Status: She is alert and oriented to person, place, and time.  Psychiatric:        Mood and Affect: Mood normal.        Behavior: Behavior normal.        Thought Content: Thought content normal.           Assessment & Plan:  Dysuria/frequency- new.  Pt reports sxs started after initial vaginal itching and OTC treatment w/ monistat.  Sxs and UA suspicious for infxn.  Start Keflex.  Also send urine cytology to ensure yeast has been adequately treated.  Pt expressed understanding and is in agreement w/ plan.

## 2020-09-27 LAB — URINE CULTURE
MICRO NUMBER:: 11522081
SPECIMEN QUALITY:: ADEQUATE

## 2020-09-29 NOTE — Progress Notes (Signed)
Reviewed via [place].

## 2020-09-30 ENCOUNTER — Telehealth: Payer: Self-pay | Admitting: Family Medicine

## 2020-09-30 NOTE — Telephone Encounter (Signed)
Patient finished her medication for her uti this morning.  She said it is a lot better but still burns a little.  She wants to know if you could call her in another rx.  Please advise

## 2020-10-01 ENCOUNTER — Other Ambulatory Visit: Payer: 59

## 2020-10-01 ENCOUNTER — Other Ambulatory Visit (INDEPENDENT_AMBULATORY_CARE_PROVIDER_SITE_OTHER): Payer: 59

## 2020-10-01 ENCOUNTER — Other Ambulatory Visit: Payer: Self-pay

## 2020-10-01 DIAGNOSIS — N390 Urinary tract infection, site not specified: Secondary | ICD-10-CM

## 2020-10-01 LAB — POCT URINALYSIS DIPSTICK OB
Glucose, UA: NEGATIVE
Leukocytes, UA: NEGATIVE
Nitrite, UA: NEGATIVE
Spec Grav, UA: >=1.03 — AB (ref 1.010–1.025)
Urobilinogen, UA: 0.2 E.U./dL
pH, UA: 6 (ref 5.0–8.0)

## 2020-10-01 NOTE — Telephone Encounter (Signed)
Please advise if patient needs to give another urine sample at lab appointment on 10/07/20 for further evaluation

## 2020-10-01 NOTE — Telephone Encounter (Signed)
Pt is agreeable of coming in to leave a urine sample. Can you please place orders so wants to come in today.

## 2020-10-01 NOTE — Telephone Encounter (Signed)
Orders for urine have been placed

## 2020-10-01 NOTE — Telephone Encounter (Signed)
Patient called back wanting to know if Dr. Birdie Riddle is going to call her in another antibiotic - Please advise

## 2020-10-01 NOTE — Telephone Encounter (Signed)
If she is still symptomatic she needs to come in for another urine sample as she did not have infection based on the last culture.

## 2020-10-02 ENCOUNTER — Telehealth: Payer: Self-pay | Admitting: Family Medicine

## 2020-10-02 NOTE — Telephone Encounter (Signed)
Called and spoke with patient about concerns. Informed patient that as soon as her urine culture comes back and I will call her with results. Pt understood.

## 2020-10-02 NOTE — Telephone Encounter (Signed)
Patient called back wanting to know the results of her test and if an antibiotic is being called in.  Please advise

## 2020-10-02 NOTE — Telephone Encounter (Signed)
Patient would like to know if you have the results of her urine test yet and if you will be calling in an antibiotic?  Please advise

## 2020-10-04 LAB — URINE CULTURE
MICRO NUMBER:: 11544539
SPECIMEN QUALITY:: ADEQUATE

## 2020-10-07 ENCOUNTER — Other Ambulatory Visit: Payer: Self-pay | Admitting: Family Medicine

## 2020-10-07 ENCOUNTER — Other Ambulatory Visit: Payer: Self-pay

## 2020-10-07 ENCOUNTER — Ambulatory Visit (INDEPENDENT_AMBULATORY_CARE_PROVIDER_SITE_OTHER): Payer: 59 | Admitting: Emergency Medicine

## 2020-10-07 DIAGNOSIS — E539 Vitamin B deficiency, unspecified: Secondary | ICD-10-CM | POA: Diagnosis not present

## 2020-10-07 DIAGNOSIS — E861 Hypovolemia: Secondary | ICD-10-CM

## 2020-10-07 MED ORDER — CYANOCOBALAMIN 1000 MCG/ML IJ SOLN
1000.0000 ug | Freq: Once | INTRAMUSCULAR | Status: AC
  Administered 2020-10-07: 1000 ug via INTRAMUSCULAR

## 2020-10-07 NOTE — Progress Notes (Signed)
Stacy [name] is a 40 y.o. female presents to the office today for Cyanocobalamin 1,000 mcg/ml monthly injections, per physician's orders. Cyanocobalamin (med), 1,000 mcg/ml (dose),  IM (route) was administered Right deltoid (location) today. Patient tolerated injection.   Leonidas Romberg, CMA

## 2020-10-29 ENCOUNTER — Other Ambulatory Visit: Payer: Self-pay | Admitting: Family Medicine

## 2020-10-29 DIAGNOSIS — I9589 Other hypotension: Secondary | ICD-10-CM

## 2020-10-29 DIAGNOSIS — E861 Hypovolemia: Secondary | ICD-10-CM

## 2020-11-05 ENCOUNTER — Other Ambulatory Visit: Payer: Self-pay

## 2020-11-05 ENCOUNTER — Ambulatory Visit (INDEPENDENT_AMBULATORY_CARE_PROVIDER_SITE_OTHER): Payer: 59 | Admitting: Family Medicine

## 2020-11-05 DIAGNOSIS — E539 Vitamin B deficiency, unspecified: Secondary | ICD-10-CM

## 2020-11-05 MED ORDER — CYANOCOBALAMIN 1000 MCG/ML IJ SOLN
1000.0000 ug | Freq: Once | INTRAMUSCULAR | Status: AC
  Administered 2020-11-05: 1000 ug via INTRAMUSCULAR

## 2020-11-05 NOTE — Progress Notes (Signed)
Pt tolerated injxn w/o difficulty

## 2020-11-21 ENCOUNTER — Telehealth (INDEPENDENT_AMBULATORY_CARE_PROVIDER_SITE_OTHER): Payer: 59 | Admitting: Family Medicine

## 2020-11-21 ENCOUNTER — Encounter: Payer: Self-pay | Admitting: Family Medicine

## 2020-11-21 ENCOUNTER — Other Ambulatory Visit: Payer: Self-pay | Admitting: Family Medicine

## 2020-11-21 DIAGNOSIS — J329 Chronic sinusitis, unspecified: Secondary | ICD-10-CM

## 2020-11-21 DIAGNOSIS — E861 Hypovolemia: Secondary | ICD-10-CM

## 2020-11-21 DIAGNOSIS — B9689 Other specified bacterial agents as the cause of diseases classified elsewhere: Secondary | ICD-10-CM

## 2020-11-21 MED ORDER — AMOXICILLIN 875 MG PO TABS: 875.0000 mg | ORAL_TABLET | Freq: Two times a day (BID) | ORAL | 0 refills | Status: DC

## 2020-11-21 NOTE — Progress Notes (Signed)
Virtual Visit via Video   I connected with patient on 11/21/20 at  2:30 PM EDT by a video enabled telemedicine application and verified that I am speaking with the correct person using two identifiers.  Location patient: Home Location provider: [facility], Office Persons participating in the virtual visit: Patient, Provider, [place] (Sabrina M)  I discussed the limitations of evaluation and management by telemedicine and the availability of in person appointments. The patient expressed understanding and agreed to proceed.  Subjective:   HPI:   URI- 'I have sinuses'.  + nasal congestion- thick yellow drainage.  + sinus pressure/HA.  Bilateral ear popping.  + wet cough.  + chills.  No tooth pain.  Son brought this home from school last week and it has been running through the family.  + post-tussive emesis yesterday.  Took Dayquil this AM.  ROS:   See pertinent positives and negatives per HPI.  Patient Active Problem List   Diagnosis Date Noted  . Anxiety 09/25/2020  . Chronic idiopathic constipation 09/25/2020  . Lesion of liver 09/25/2020  . Weight loss 09/25/2020  . Adrenal insufficiency ([place]) 09/16/2020  . Amenorrhea 07/21/2020  . Orthostatic hypotension   . Hypophosphatemia   . Volume depletion, gastrointestinal loss   . Hypomagnesemia   . Bloating   . Volume depletion 05/16/2020  . Nausea and vomiting 04/28/2020  . Failure to thrive in adult 04/27/2020  . Physical exam 01/01/2020  . Generalized abdominal pain 04/25/2019  . Hyponatremia 04/25/2019  . Hypokalemia 04/25/2019  . Severe protein-calorie malnutrition ([place]) 04/25/2019  . Gastroesophageal reflux disease 04/07/2019  . Acute lower UTI 04/07/2019  . Normocytic anemia 04/07/2019  . 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 Iron deficiency anemia 01/22/2018  . Vitamin B deficiency 01/22/2018  . Vitamin D deficiency 01/22/2018  . PCP NOTES >>>>>>>>>>>>>>>>>>>>>>>>>>>>> 06/28/2017  .  Underweight 06/27/2017  . Cyst of ovary 10/05/2016  . Hypotension 07/27/2013  . Hepatic adenoma 07/27/2013    Social History   Tobacco Use  . Smoking status: Never Smoker  . Smokeless tobacco: Never Used  Substance Use Topics  . Alcohol use: No    Comment: socially     Current Outpatient Medications:  .  acetaminophen (TYLENOL) 500 MG tablet, Take 500 mg by mouth every 6 (six) hours as needed for headache (pain)., Disp: , Rfl:  .  cholecalciferol (VITAMIN D3) 25 MCG (1000 UNIT) tablet, TAKE 2 TABLETS (2,000 UNITS TOTAL) BY MOUTH DAILY., Disp: 200 tablet, Rfl: 3 .  COMPRO 25 MG suppository, Place 25 mg rectally every 12 (twelve) hours as needed for nausea., Disp: , Rfl:  .  cyanocobalamin (,VITAMIN B-12,) 1000 MCG/ML injection, Inject 1,000 mcg into the muscle every 30 (thirty) days., Disp: , Rfl:  .  famotidine (PEPCID) 20 MG tablet, TAKE 1 TABLET BY MOUTH EVERYDAY AT BEDTIME, Disp: 90 tablet, Rfl: 2 .  fludrocortisone (FLORINEF) 0.1 MG tablet, Take 1 tablet (0.1 mg total) by mouth 2 (two) times daily., Disp: 120 tablet, Rfl: 1 .  Magnesium 500 MG TABS, 1 tablet with a meal, Disp: , Rfl:  .  metroNIDAZOLE (FLAGYL) 500 MG tablet, Take by mouth., Disp: , Rfl:  .  midodrine (PROAMATINE) 10 MG tablet, TAKE 1 TABLET BY MOUTH 3 TIMES DAILY WITH MEALS., Disp: 90 tablet, Rfl: 0 .  mirtazapine (REMERON) 15 MG tablet, TAKE 1 TABLET BY MOUTH EVERYDAY AT BEDTIME, Disp: 90 tablet, Rfl: 2 .  montelukast (SINGULAIR) 10 MG tablet, Take 10 mg by mouth daily., Disp: ,  Rfl:  .  Multiple Vitamin (MULTIVITAMIN WITH MINERALS) TABS tablet, Take 1 tablet by mouth daily., Disp: , Rfl:  .  Nutritional Supplements (,FEEDING SUPPLEMENT, PROSOURCE PLUS) liquid, Take 30 mLs by mouth 2 (two) times daily between meals., Disp: , Rfl:  .  omeprazole (PRILOSEC) 40 MG capsule, TAKE 1 CAPSULE BY MOUTH EVERY DAY, Disp: 90 capsule, Rfl: 1 .  ondansetron (ZOFRAN-ODT) 8 MG disintegrating tablet, Take 8 mg by mouth 3 (three)  times daily as needed for nausea/vomiting., Disp: , Rfl:  .  polyethylene glycol (MIRALAX / GLYCOLAX) 17 g packet, Take 17 g by mouth 3 (three) times daily., Disp: 14 each, Rfl: 0 .  RABEprazole (ACIPHEX) 20 MG tablet, Take by mouth., Disp: , Rfl:   Allergies  Allergen Reactions  . Tetracyclines & Related Swelling    Swelling in spine; required spinal tap.   . Erythromycin Other (See Comments)    Swelling in spine; required spinal tap  . Raspberry Rash  . Sulfa Antibiotics Rash  . Sulfonamide Derivatives Rash    Reaction to cream    Objective:   There were no vitals taken for this visit. AAOx3, NAD NCAT, EOMI No obvious CN deficits + pale Pt is able to speak clearly, coherently without shortness of breath or increased work of breathing.  + cough Thought process is linear.  Mood is appropriate.   Assessment and Plan:   Bacterial sinusitis- new.  Pt's sxs and recent sick contacts are consistent w/ dx.  Start Amoxicillin.  Reviewed supportive care and red flags that should prompt return.  Pt expressed understanding and is in agreement w/ plan.   [name], MD 11/21/2020

## 2020-11-21 NOTE — Progress Notes (Signed)
I connected with  Stacy [name] on 11/21/20 by a video enabled telemedicine application and verified that I am speaking with the correct person using two identifiers.   I discussed the limitations of evaluation and management by telemedicine. The patient expressed understanding and agreed to proceed.

## 2020-11-24 ENCOUNTER — Telehealth: Payer: Self-pay | Admitting: Family Medicine

## 2020-11-24 NOTE — Telephone Encounter (Signed)
Please see message below

## 2020-11-24 NOTE — Telephone Encounter (Signed)
Patient called and stated that her temperature was 102 at 6:00am - after medication and tylenol - 99.5.  Please advise.  Is there something else she should be doing?

## 2020-11-24 NOTE — Telephone Encounter (Signed)
Temperature was up this morn around 6. Patient took tylenol and it decreased to 99.5. Is there anything else she can do to get it down more?

## 2020-11-24 NOTE — Telephone Encounter (Signed)
Spoke with patient regarding concerns of fever. She stated that she broke her fever around lunch time. It is now 99.3. I advised patient  per your order to alternate between the Tylenol and ibprofen X4hrs and use a cool compress as well. Patient voiced understanding. No further concerns at this time.

## 2020-11-24 NOTE — Telephone Encounter (Signed)
Alternate tylenol and ibuprofen every 4 hrs for fever relief.  Cool compresses to forehead will help

## 2020-12-08 ENCOUNTER — Ambulatory Visit (INDEPENDENT_AMBULATORY_CARE_PROVIDER_SITE_OTHER): Payer: 59 | Admitting: Family Medicine

## 2020-12-08 ENCOUNTER — Other Ambulatory Visit: Payer: Self-pay

## 2020-12-08 DIAGNOSIS — E539 Vitamin B deficiency, unspecified: Secondary | ICD-10-CM | POA: Diagnosis not present

## 2020-12-09 MED ORDER — CYANOCOBALAMIN 1000 MCG/ML IJ SOLN
1000.0000 ug | Freq: Once | INTRAMUSCULAR | Status: AC
  Administered 2020-12-08: 1000 ug via INTRAMUSCULAR

## 2020-12-10 LAB — HM PAP SMEAR

## 2020-12-10 LAB — HM MAMMOGRAPHY

## 2020-12-18 ENCOUNTER — Other Ambulatory Visit: Payer: Self-pay | Admitting: Family Medicine

## 2020-12-18 DIAGNOSIS — E861 Hypovolemia: Secondary | ICD-10-CM

## 2021-01-02 ENCOUNTER — Other Ambulatory Visit: Payer: Self-pay

## 2021-01-02 ENCOUNTER — Encounter: Payer: Self-pay | Admitting: Family Medicine

## 2021-01-02 ENCOUNTER — Ambulatory Visit (INDEPENDENT_AMBULATORY_CARE_PROVIDER_SITE_OTHER): Payer: 59 | Admitting: Family Medicine

## 2021-01-02 VITALS — BP 120/81 | HR 81 | Temp 98.0°F | Resp 16 | Ht 62.9 in | Wt 92.6 lb

## 2021-01-02 DIAGNOSIS — Z0000 Encounter for general adult medical examination without abnormal findings: Secondary | ICD-10-CM | POA: Diagnosis not present

## 2021-01-02 DIAGNOSIS — E559 Vitamin D deficiency, unspecified: Secondary | ICD-10-CM | POA: Diagnosis not present

## 2021-01-02 DIAGNOSIS — E43 Unspecified severe protein-calorie malnutrition: Secondary | ICD-10-CM | POA: Diagnosis not present

## 2021-01-02 DIAGNOSIS — E539 Vitamin B deficiency, unspecified: Secondary | ICD-10-CM

## 2021-01-02 LAB — LIPID PANEL
Cholesterol: 139 mg/dL (ref 0–200)
HDL: 53.2 mg/dL (ref >39.00)
LDL Cholesterol: 75 mg/dL (ref 0–99)
NonHDL: 85.91
Total CHOL/HDL Ratio: 3
Triglycerides: 56 mg/dL (ref 0.0–149.0)
VLDL: 11.2 mg/dL (ref 0.0–40.0)

## 2021-01-02 LAB — CBC WITH DIFFERENTIAL/PLATELET
Basophils Absolute: 0 10*3/uL (ref 0.0–0.1)
Basophils Relative: 0.3 % (ref 0.0–3.0)
Eosinophils Absolute: 0 10*3/uL (ref 0.0–0.7)
Eosinophils Relative: 0.3 % (ref 0.0–5.0)
HCT: 33.6 % — ABNORMAL LOW (ref 36.0–46.0)
Hemoglobin: 11.3 g/dL — ABNORMAL LOW (ref 12.0–15.0)
Lymphocytes Relative: 12.3 % (ref 12.0–46.0)
Lymphs Abs: 1.6 10*3/uL (ref 0.7–4.0)
MCHC: 33.4 g/dL (ref 30.0–36.0)
MCV: 88.6 fl (ref 78.0–100.0)
Monocytes Absolute: 0.7 10*3/uL (ref 0.1–1.0)
Monocytes Relative: 5.7 % (ref 3.0–12.0)
Neutro Abs: 10.6 10*3/uL — ABNORMAL HIGH (ref 1.4–7.7)
Neutrophils Relative %: 81.4 % — ABNORMAL HIGH (ref 43.0–77.0)
Platelets: 323 10*3/uL (ref 150.0–400.0)
RBC: 3.8 Mil/uL — ABNORMAL LOW (ref 3.87–5.11)
RDW: 13.7 % (ref 11.5–15.5)
WBC: 13.1 10*3/uL — ABNORMAL HIGH (ref 4.0–10.5)

## 2021-01-02 LAB — HEPATIC FUNCTION PANEL
ALT: 8 U/L (ref 0–35)
AST: 11 U/L (ref 0–37)
Albumin: 3.9 g/dL (ref 3.5–5.2)
Alkaline Phosphatase: 66 U/L (ref 39–117)
Bilirubin, Direct: 0.2 mg/dL (ref 0.0–0.3)
Total Bilirubin: 0.9 mg/dL (ref 0.2–1.2)
Total Protein: 6 g/dL (ref 6.0–8.3)

## 2021-01-02 LAB — BASIC METABOLIC PANEL
BUN: 16 mg/dL (ref 6–23)
CO2: 29 mEq/L (ref 19–32)
Calcium: 9 mg/dL (ref 8.4–10.5)
Chloride: 103 mEq/L (ref 96–112)
Creatinine, Ser: 0.44 mg/dL (ref 0.40–1.20)
GFR: 120.65 mL/min (ref >60.00)
Glucose, Bld: 81 mg/dL (ref 70–99)
Potassium: 3.9 mEq/L (ref 3.5–5.1)
Sodium: 139 mEq/L (ref 135–145)

## 2021-01-02 LAB — VITAMIN D 25 HYDROXY (VIT D DEFICIENCY, FRACTURES): VITD: 18.82 ng/mL — ABNORMAL LOW (ref 30.00–100.00)

## 2021-01-02 LAB — MAGNESIUM: Magnesium: 1.9 mg/dL (ref 1.5–2.5)

## 2021-01-02 LAB — B12 AND FOLATE PANEL
Folate: 17.3 ng/mL (ref >5.9)
Vitamin B-12: >1550 pg/mL — ABNORMAL HIGH (ref 211–911)

## 2021-01-02 LAB — TSH: TSH: 0.47 u[IU]/mL (ref 0.35–4.50)

## 2021-01-02 MED ORDER — CYANOCOBALAMIN 1000 MCG/ML IJ SOLN
1000.0000 ug | Freq: Once | INTRAMUSCULAR | Status: AC
  Administered 2021-01-02: 1000 ug via INTRAMUSCULAR

## 2021-01-02 NOTE — Assessment & Plan Note (Signed)
Check Vit D and replete prn. 

## 2021-01-02 NOTE — Assessment & Plan Note (Signed)
Pt's PE unchanged from previous and WNL w/ exception of being severely underweight.  UTD on pap, mammo, colonoscopy, immunizations.  Check labs.  Anticipatory guidance provided.

## 2021-01-02 NOTE — Progress Notes (Signed)
Patient presents to the office for her B12 injection. Patient received 1,082ml of cyanocobalamin left deltoid and  tolerated well. Patient will return next month to receive her next B12 injection.

## 2021-01-02 NOTE — Assessment & Plan Note (Signed)
B12 injxn given today. 

## 2021-01-02 NOTE — Progress Notes (Signed)
   Subjective:    Patient ID: Stacy [name], female    DOB: 1981/01/13, 40 y.o.   MRN: [id number]  HPI CPE- UTD on colonoscopy, mammo, pap, immunizations.  Reviewed past medical, surgical, family and social histories.   Patient Care Team    Relationship Specialty Notifications Start End  [name], MD PCP - General Family Medicine  07/17/19   Skeet Latch, MD PCP - Cardiology Cardiology  05/25/20   Wilford Corner, MD Consulting Physician Gastroenterology  06/27/17   Allyn Kenner, [place] Physician Obstetrics and Gynecology  06/27/17   Elige Ko, [place] Consulting Physician Gastroenterology  04/12/19   Sherlynn Carbon, MD Referring Physician Gastroenterology  07/17/19       Review of Systems Patient reports no vision/ hearing changes, adenopathy,fever, weight change,  persistant/recurrent hoarseness , swallowing issues, chest pain, palpitations, edema, persistant/recurrent cough, hemoptysis, dyspnea (rest/exertional/paroxysmal nocturnal), gastrointestinal bleeding (melena, rectal bleeding), abdominal pain, significant heartburn, bowel changes, GU symptoms (dysuria, hematuria, incontinence), Gyn symptoms (abnormal  bleeding, pain),  syncope, focal weakness, memory loss, numbness & tingling, skin/hair/nail changes, abnormal bruising or bleeding, anxiety, or depression.   This visit occurred during the SARS-CoV-2 public health emergency.  Safety protocols were in place, including screening questions prior to the visit, additional usage of staff PPE, and extensive cleaning of exam room while observing appropriate contact time as indicated for disinfecting solutions.       Objective:   Physical Exam General Appearance:    Alert, cooperative, no distress, appears stated age, underweight  Head:    Normocephalic, without obvious abnormality, atraumatic  Eyes:    PERRL, conjunctiva/corneas clear, EOM's intact, fundi    benign, both eyes  Ears:     Normal TM's and external ear canals, both ears  Nose:   Deferred due to COVID  Throat:   Neck:   Supple, symmetrical, trachea midline, no adenopathy;    Thyroid: no enlargement/tenderness/nodules  Back:     Symmetric, no curvature, ROM normal, no CVA tenderness  Lungs:     Clear to auscultation bilaterally, respirations unlabored  Chest Wall:    No tenderness or deformity   Heart:    Regular rate and rhythm, S1 and S2 normal, no murmur, rub   or gallop  Breast Exam:    Deferred to GYN  Abdomen:     Soft, non-tender, bowel sounds active all four quadrants,    no masses, no organomegaly  Genitalia:    Deferred to GYN  Rectal:    Extremities:   Extremities normal, atraumatic, no cyanosis or edema  Pulses:   2+ and symmetric all extremities  Skin:   Skin color, texture, turgor normal, no rashes or lesions  Lymph nodes:   Cervical, supraclavicular, and axillary nodes normal  Neurologic:   CNII-XII intact, normal strength, sensation and reflexes    throughout          Assessment & Plan:

## 2021-01-02 NOTE — Assessment & Plan Note (Signed)
Pt is working on eating and drinking more regularly.  Applauded her efforts.  Will check labs to risk stratify.

## 2021-01-02 NOTE — Addendum Note (Signed)
Addended by: Genevie Cheshire L on: 01/02/2021 09:51 AM   Modules accepted: Orders

## 2021-01-02 NOTE — Patient Instructions (Signed)
Follow up in 1 year or as needed We'll notify you of your lab results and make any changes if needed Keep up the good work on eating and drinking! Call with any questions or concerns Stay Safe!  Stay Healthy! Have a great summer!!!

## 2021-01-05 ENCOUNTER — Other Ambulatory Visit: Payer: Self-pay

## 2021-01-05 MED ORDER — VITAMIN D (ERGOCALCIFEROL) 1.25 MG (50000 UNIT) PO CAPS: 50000.0000 [IU] | ORAL_CAPSULE | ORAL | 0 refills | Status: AC

## 2021-01-13 ENCOUNTER — Other Ambulatory Visit: Payer: Self-pay | Admitting: Family Medicine

## 2021-01-13 DIAGNOSIS — E861 Hypovolemia: Secondary | ICD-10-CM

## 2021-01-17 ENCOUNTER — Other Ambulatory Visit: Payer: Self-pay | Admitting: Hematology and Oncology

## 2021-01-19 ENCOUNTER — Encounter: Payer: Self-pay | Admitting: Hematology and Oncology

## 2021-01-27 ENCOUNTER — Other Ambulatory Visit: Payer: Self-pay | Admitting: Family Medicine

## 2021-01-27 ENCOUNTER — Other Ambulatory Visit: Payer: Self-pay | Admitting: Hematology and Oncology

## 2021-01-27 DIAGNOSIS — I9589 Other hypotension: Secondary | ICD-10-CM

## 2021-01-28 ENCOUNTER — Telehealth: Payer: Self-pay | Admitting: Family Medicine

## 2021-01-28 ENCOUNTER — Encounter: Payer: Self-pay | Admitting: Hematology and Oncology

## 2021-01-28 NOTE — Telephone Encounter (Signed)
..  Medication Refills  Last OV:  Medication:  Fludrocortiso-ne 0.1 mg.  Pharmacy:  [facility] [place]  Let patient know to contact pharmacy at the end of the day to make sure medication is ready.   Please notify patient to allow 48-72 hours to process.  Encourage patient to contact the pharmacy for refills or they can request refills through [place] out below:   Last refill:  QTY:  Refill Date:    Other Comments:   Okay for refill?  Please advise.

## 2021-01-30 MED ORDER — FLUDROCORTISONE ACETATE 0.1 MG PO TABS: 0.1000 mg | ORAL_TABLET | Freq: Two times a day (BID) | ORAL | 1 refills | Status: DC

## 2021-01-30 NOTE — Telephone Encounter (Signed)
Prescription filled to local pharmacy

## 2021-02-03 ENCOUNTER — Ambulatory Visit (INDEPENDENT_AMBULATORY_CARE_PROVIDER_SITE_OTHER): Payer: 59

## 2021-02-03 ENCOUNTER — Other Ambulatory Visit: Payer: Self-pay

## 2021-02-03 DIAGNOSIS — E539 Vitamin B deficiency, unspecified: Secondary | ICD-10-CM

## 2021-02-03 MED ORDER — CYANOCOBALAMIN 1000 MCG/ML IJ SOLN
1000.0000 ug | Freq: Once | INTRAMUSCULAR | Status: AC
  Administered 2021-02-03: 1000 ug via INTRAMUSCULAR

## 2021-02-03 NOTE — Progress Notes (Signed)
Patient presents to the office to receive her B12 injection. Patient received 1,065mcg of cyanocobalamin in her right deltoid. Patient tolerated well. Patient will return next month for her next B12 injection.

## 2021-02-10 NOTE — Progress Notes (Signed)
Today's order was mine

## 2021-02-11 ENCOUNTER — Other Ambulatory Visit: Payer: Self-pay | Admitting: Family Medicine

## 2021-02-11 ENCOUNTER — Encounter: Payer: Self-pay | Admitting: *Deleted

## 2021-02-11 NOTE — Telephone Encounter (Signed)
Medication filled by historical provider. Please advise.

## 2021-03-04 ENCOUNTER — Other Ambulatory Visit: Payer: Self-pay | Admitting: Family Medicine

## 2021-03-04 DIAGNOSIS — I9589 Other hypotension: Secondary | ICD-10-CM

## 2021-03-04 DIAGNOSIS — E861 Hypovolemia: Secondary | ICD-10-CM

## 2021-03-05 ENCOUNTER — Ambulatory Visit (INDEPENDENT_AMBULATORY_CARE_PROVIDER_SITE_OTHER): Payer: 59 | Admitting: Registered Nurse

## 2021-03-05 ENCOUNTER — Ambulatory Visit: Payer: 59

## 2021-03-05 ENCOUNTER — Other Ambulatory Visit: Payer: Self-pay

## 2021-03-05 DIAGNOSIS — E539 Vitamin B deficiency, unspecified: Secondary | ICD-10-CM | POA: Diagnosis not present

## 2021-03-12 ENCOUNTER — Telehealth (INDEPENDENT_AMBULATORY_CARE_PROVIDER_SITE_OTHER): Payer: 59 | Admitting: Family Medicine

## 2021-03-12 ENCOUNTER — Other Ambulatory Visit: Payer: Self-pay

## 2021-03-12 DIAGNOSIS — R059 Cough, unspecified: Secondary | ICD-10-CM | POA: Diagnosis not present

## 2021-03-12 DIAGNOSIS — U071 COVID-19: Secondary | ICD-10-CM | POA: Diagnosis not present

## 2021-03-12 MED ORDER — NIRMATRELVIR/RITONAVIR (PAXLOVID)TABLET: 3.0000 | ORAL_TABLET | Freq: Two times a day (BID) | ORAL | 0 refills | Status: AC

## 2021-03-12 MED ORDER — BENZONATATE 100 MG PO CAPS: 100.0000 mg | ORAL_CAPSULE | Freq: Three times a day (TID) | ORAL | 0 refills | Status: DC | PRN

## 2021-03-12 NOTE — Progress Notes (Signed)
Virtual Visit via Video Note  I connected with Stacy [name] on 03/12/21 at 9:15 AM by a video enabled telemedicine application and verified that I am speaking with the correct person using two identifiers.  Patient location:home My location: office -[place]   I discussed the limitations, risks, security and privacy concerns of performing an evaluation and management service by telephone and the availability of in person appointments. I also discussed with the patient that there may be a patient responsible charge related to this service. The patient expressed understanding and agreed to proceed, consent obtained  Chief complaint:  Chief Complaint  Patient presents with   Covid Exposure    Pt reports fever 102 monday , headache, chills, body aches, nausea and vomiting Monday Tuesday. Trip to [place] last week. Tested positive yesterday both her kids are sick as well      History of Present Illness: Stacy [name] is a 40 y.o. female  COVID-19 infection Initial symptoms 03/09/2021, fatigue. Fever 102, cough , headache, chills 2 days ago. Drinking fluids. body aches, nausea, vomited 2 days ago then resolved. Able to drink fluids better past 2 days - eating and drinking fluids better past 2 days.  Home O2 sat 95-97% Positive test 03/11/2021 Trip out of town last week - returned Monday from [facility], sick contacts with both children as well - 72 yo and 11yo.  No chest pain, no dyspnea, no confusion, drinking fluids as above. Has Zofran if needed - used 2 days ago. Not needed today.  Tx: motrin, tylenol.    COVID risk of complications score 0. COVID-vaccine March, April 2021 with booster in January.  Lab Results  Component Value Date   ALT 8 01/02/2021   AST 11 01/02/2021   ALKPHOS 66 01/02/2021   BILITOT 0.9 01/02/2021   Lab Results  Component Value Date   CREATININE 0.44 01/02/2021  GFR 120 in May.    Patient Active Problem List   Diagnosis Date Noted    Anxiety 09/25/2020   Chronic idiopathic constipation 09/25/2020   Lesion of liver 09/25/2020   Weight loss 09/25/2020   Adrenal insufficiency ([place]) 09/16/2020   Amenorrhea 07/21/2020   Orthostatic hypotension    Hypophosphatemia    Volume depletion, gastrointestinal loss    Hypomagnesemia    Bloating    Nausea and vomiting 04/28/2020   Failure to thrive in adult 04/27/2020   Physical exam 01/01/2020   Generalized abdominal pain 04/25/2019   Hyponatremia 04/25/2019   Hypokalemia 04/25/2019   Severe protein-calorie malnutrition ([place]) 04/25/2019   Gastroesophageal reflux disease 04/07/2019   Normocytic anemia 04/07/2019   Chronic intestinal pseudo-obstruction 09/05/2018   Iron deficiency anemia 01/22/2018   Vitamin B deficiency 01/22/2018   Vitamin D deficiency 01/22/2018   Underweight 06/27/2017   Cyst of ovary 10/05/2016   Hepatic adenoma 07/27/2013   Past Medical History:  Diagnosis Date   Anxiety    Benign liver cyst    Chronic intestinal pseudo-obstruction    GERD (gastroesophageal reflux disease)    History of UTI    Past Surgical History:  Procedure Laterality Date   BIOPSY  05/20/2020   Procedure: BIOPSY;  Surgeon: Wonda Horner, MD;  Location: [facility];  Service: Endoscopy;;   CESAREAN SECTION     twice 2010, 2013   ESOPHAGOGASTRODUODENOSCOPY (EGD) WITH PROPOFOL N/A 05/20/2020   Procedure: ESOPHAGOGASTRODUODENOSCOPY (EGD) WITH PROPOFOL;  Surgeon: Wonda Horner, MD;  Location: [facility];  Service: Endoscopy;  Laterality: N/A;   [place]  ~  2015   Allergies  Allergen Reactions   Tetracyclines & Related Swelling    Swelling in spine; required spinal tap.    Erythromycin Other (See Comments)    Swelling in spine; required spinal tap   Raspberry Rash   Sulfa Antibiotics Rash   Sulfonamide Derivatives Rash    Reaction to cream   Prior to Admission medications   Medication Sig Start Date End Date Taking? Authorizing Provider  acetaminophen (TYLENOL) 500  MG tablet Take 500 mg by mouth every 6 (six) hours as needed for headache (pain).   Yes [provider]  cholecalciferol (VITAMIN D3) 25 MCG (1000 UNIT) tablet TAKE 2 TABLETS (2,000 UNITS TOTAL) BY MOUTH DAILY. 12/31/19  Yes [name], MD  COMPRO 25 MG suppository Place 25 mg rectally every 12 (twelve) hours as needed for nausea. 05/12/20  Yes [provider]  cyanocobalamin (,VITAMIN B-12,) 1000 MCG/ML injection Inject 1,000 mcg into the muscle every 30 (thirty) days.   Yes [provider]  famotidine (PEPCID) 20 MG tablet TAKE 1 TABLET BY MOUTH EVERYDAY AT BEDTIME 08/01/20  Yes [name], MD  fludrocortisone (FLORINEF) 0.1 MG tablet Take 1 tablet (0.1 mg total) by mouth 2 (two) times daily. 01/30/21  Yes [name], MD  Magnesium 500 MG TABS 1 tablet with a meal   Yes [provider]  metroNIDAZOLE (FLAGYL) 500 MG tablet Take by mouth. 08/27/20  Yes [provider]  midodrine (PROAMATINE) 10 MG tablet TAKE 1 TABLET BY MOUTH 3 TIMES DAILY WITH MEALS. 03/04/21  Yes [name], MD  mirtazapine (REMERON) 15 MG tablet TAKE 1 TABLET BY MOUTH EVERYDAY AT BEDTIME 07/01/20  Yes [name], MD  montelukast (SINGULAIR) 10 MG tablet Take 10 mg by mouth daily. 01/08/20  Yes [provider]  MOTEGRITY 2 MG TABS TAKE 1 TABLET BY MOUTH EVERY DAY 02/11/21  Yes [name], MD  Multiple Vitamin (MULTIVITAMIN WITH MINERALS) TABS tablet Take 1 tablet by mouth daily. 05/29/20  Yes Eugenie Filler, MD  omeprazole (PRILOSEC) 40 MG capsule TAKE 1 CAPSULE BY MOUTH EVERY DAY 05/12/20  Yes [name], MD  ondansetron (ZOFRAN-ODT) 8 MG disintegrating tablet Take 8 mg by mouth 3 (three) times daily as needed for nausea/vomiting. 05/12/20  Yes [provider]  polyethylene glycol (MIRALAX / GLYCOLAX) 17 g packet Take 17 g by mouth 3 (three) times daily. 05/28/20  Yes Eugenie Filler, MD  sucralfate  (CARAFATE) 1 g tablet Take 1 g by mouth 4 (four) times daily. 12/30/20  Yes [provider]  Vitamin D, Ergocalciferol, (DRISDOL) 1.25 MG (50000 UNIT) CAPS capsule Take 1 capsule (50,000 Units total) by mouth every 7 (seven) days for 12 doses. 01/05/21 03/24/21 Yes [name], MD  RABEprazole (ACIPHEX) 20 MG tablet Take by mouth. 06/25/20 09/08/20  [provider]   Social History   Socioeconomic History   Marital status: Married    Spouse name: Not on file   Number of children: 2   Years of education: Not on file   Highest education level: Not on file  Occupational History   Occupation: stay home ; associate degree paralega   Tobacco Use   Smoking status: Never   Smokeless tobacco: Never  Vaping Use   Vaping Use: Never used  Substance and Sexual Activity   Alcohol use: No    Comment: socially    Drug use: No   Sexual activity: Yes  Birth control/protection: None  Other Topics Concern   Not on file  Social History Narrative   Household: pt, husband , 2 children   2 boys: 2010, 2013   P2G2   Original  from [place], no foreign trips   Social Determinants of Radio broadcast assistant Strain: Not on [employer] Insecurity: Not on file  Transportation Needs: Not on file  Physical Activity: Not on file  Stress: Not on file  Social Connections: Not on file  Intimate Partner Violence: Not on file    Observations/Objective: There were no vitals filed for this visit. O2 sat 97% as above. Appears fatigued, but not toxic appearing on video.  Speaking in full sentences without respiratory distress.  Coherent responses.  All questions were answered with understanding of plan expressed  Assessment and Plan: Cough - Plan: benzonatate (TESSALON) 100 MG capsule  COVID-19 virus infection - Plan: nirmatrelvir/ritonavir EUA (PAXLOVID) TABS Status post vaccine and booster.  Multiple family members with COVID-19 infection.  No red flags on current symptoms and  now able to hydrate better, has Zofran if needed for nausea and vomiting but has not needed that medication today.  Continue symptomatic care discussed with Tylenol or Motrin for fever, body aches, headache.  Continue hydration.  Mucinex or Mucinex DM if needed for cough with prescription for Ladona Ridgel also sent to pharmacy if needed.  Antivirals were discussed and she would like to start Paxlovid.  Potential side effects and risk discussed.  No apparent contraindications on review of her meds.  Full dose based on last renal function.  ER precautions given, isolation and masking precautions given per CDC recommendations.  Follow Up Instructions: ER precautions given.   I discussed the assessment and treatment plan with the patient. The patient was provided an opportunity to ask questions and all were answered. The patient agreed with the plan and demonstrated an understanding of the instructions.   The patient was advised to call back or seek an in-person evaluation if the symptoms worsen or if the condition fails to improve as anticipated.  I provided 25 minutes of non-face-to-face time during this encounter.   Stacy Agreste, MD

## 2021-03-12 NOTE — Patient Instructions (Addendum)
Tylenol and motrin for fever, bodyache, headache.  Continue to drink plenty of fluids.  Mucinex or mucinex DM for cough as needed. Tessalon perles if needed up to 3 times per day.  Okay to use Zofran if needed for nausea or vomiting. Paxlovid sent to your pharmacy.  Start that today but if you have significant side effects with that medication, can stop it.  If chest pain, shortness of breath especially at rest, low oxygen levels as we discussed, difficulty with staying hydrated, or confusion/disorientation, be seen in the emergency room. Please let me know if there are further questions and I hope you and the family feel better soon.   This information is provided from the [facility]. There is a link provided below for more information if needed.   Everyone who has presumed or confirmed COVID-19 should stay home and isolate from other people for at least 5 full days (day 0 is the first day of symptoms or the date of the day of the positive viral test for asymptomatic persons). You can end isolation after 5 full days if you are fever-free for 24 hours without the use of fever-reducing medication and your other symptoms have improved (Loss of taste and smell may persist for weeks or months after recovery and need not delay the end of isolation). You should continue to wear a well-fitting mask around others at home and in public for 5 additional days (day 6 through day 10) after the end of your 5-day isolation period. If you are unable to wear a mask when around others, you should continue to isolate for a full 10 days. Avoid people who have weakened immune systems or are more likely to get very sick from COVID-19, and nursing homes and other high-risk settings, until after at least 10 days.  If you continue to have fever or your other symptoms have not improved after 5 days of isolation, you should wait to end your isolation until you are fever-free for 24 hours without the use of fever-reducing medication and  your other symptoms have improved. Continue to wear a well-fitting mask through day 10.   https://brown.org/.html

## 2021-03-19 MED ORDER — CYANOCOBALAMIN 1000 MCG/ML IJ SOLN
1000.0000 ug | Freq: Once | INTRAMUSCULAR | Status: AC
  Administered 2021-03-05: 1000 ug via INTRAMUSCULAR

## 2021-03-19 NOTE — Addendum Note (Signed)
Addended by: Patrcia Dolly on: 03/19/2021 11:14 AM   Modules accepted: Orders

## 2021-04-03 ENCOUNTER — Other Ambulatory Visit: Payer: Self-pay | Admitting: Family Medicine

## 2021-04-03 DIAGNOSIS — I9589 Other hypotension: Secondary | ICD-10-CM

## 2021-04-03 DIAGNOSIS — E861 Hypovolemia: Secondary | ICD-10-CM

## 2021-04-10 ENCOUNTER — Other Ambulatory Visit: Payer: Self-pay

## 2021-04-10 ENCOUNTER — Ambulatory Visit (INDEPENDENT_AMBULATORY_CARE_PROVIDER_SITE_OTHER): Payer: 59

## 2021-04-10 DIAGNOSIS — E539 Vitamin B deficiency, unspecified: Secondary | ICD-10-CM

## 2021-04-10 MED ORDER — CYANOCOBALAMIN 1000 MCG/ML IJ SOLN
1000.0000 ug | Freq: Once | INTRAMUSCULAR | Status: AC
  Administered 2021-04-10: 1000 ug via INTRAMUSCULAR

## 2021-04-10 NOTE — Progress Notes (Signed)
Patient presents to the office for a B12 injection. Patient received 67m of cyanocobalamin into her left deltoid and tolerated well. Patient will return in 4 weeks to receive her next B12 injection.

## 2021-04-11 ENCOUNTER — Other Ambulatory Visit: Payer: Self-pay | Admitting: Family Medicine

## 2021-04-19 ENCOUNTER — Other Ambulatory Visit: Payer: Self-pay | Admitting: Family Medicine

## 2021-04-22 ENCOUNTER — Other Ambulatory Visit: Payer: Self-pay | Admitting: Family Medicine

## 2021-04-23 ENCOUNTER — Encounter (HOSPITAL_BASED_OUTPATIENT_CLINIC_OR_DEPARTMENT_OTHER): Payer: Self-pay

## 2021-04-28 ENCOUNTER — Other Ambulatory Visit: Payer: Self-pay | Admitting: Family Medicine

## 2021-04-28 DIAGNOSIS — I9589 Other hypotension: Secondary | ICD-10-CM

## 2021-04-28 DIAGNOSIS — E861 Hypovolemia: Secondary | ICD-10-CM

## 2021-05-12 ENCOUNTER — Ambulatory Visit (INDEPENDENT_AMBULATORY_CARE_PROVIDER_SITE_OTHER): Payer: 59 | Admitting: Family Medicine

## 2021-05-12 ENCOUNTER — Other Ambulatory Visit: Payer: Self-pay

## 2021-05-12 DIAGNOSIS — E539 Vitamin B deficiency, unspecified: Secondary | ICD-10-CM | POA: Diagnosis not present

## 2021-05-12 MED ORDER — CYANOCOBALAMIN 1000 MCG/ML IJ SOLN
1000.0000 ug | Freq: Once | INTRAMUSCULAR | Status: AC
  Administered 2021-05-12: 1000 ug via INTRAMUSCULAR

## 2021-05-12 NOTE — Progress Notes (Signed)
cyano

## 2021-05-12 NOTE — Progress Notes (Signed)
Patient presents to the office for her B12 injection. Patient was ijnected with 1,058mcg/ml of cyanocobalamin in her left deltoid and tolerated well. Patient will return in around 4 weeks for her next B12 injection.

## 2021-05-19 ENCOUNTER — Ambulatory Visit: Payer: 59

## 2021-05-21 ENCOUNTER — Other Ambulatory Visit: Payer: Self-pay | Admitting: Family Medicine

## 2021-05-21 DIAGNOSIS — E861 Hypovolemia: Secondary | ICD-10-CM

## 2021-06-05 ENCOUNTER — Other Ambulatory Visit: Payer: Self-pay

## 2021-06-05 ENCOUNTER — Ambulatory Visit (INDEPENDENT_AMBULATORY_CARE_PROVIDER_SITE_OTHER): Payer: 59 | Admitting: Family Medicine

## 2021-06-05 DIAGNOSIS — Z23 Encounter for immunization: Secondary | ICD-10-CM | POA: Diagnosis not present

## 2021-06-05 DIAGNOSIS — E539 Vitamin B deficiency, unspecified: Secondary | ICD-10-CM | POA: Diagnosis not present

## 2021-06-05 MED ORDER — CYANOCOBALAMIN 1000 MCG/ML IJ SOLN
1000.0000 ug | Freq: Once | INTRAMUSCULAR | Status: AC
  Administered 2021-06-05: 1000 ug via INTRAMUSCULAR

## 2021-06-05 NOTE — Progress Notes (Signed)
Stacy [name] is a 41 y.o. female presents to the office today for Flu and B12 injections, per physician's orders. Patient tolerated both.   [name]

## 2021-06-08 ENCOUNTER — Other Ambulatory Visit: Payer: Self-pay | Admitting: Family Medicine

## 2021-06-08 DIAGNOSIS — I9589 Other hypotension: Secondary | ICD-10-CM

## 2021-06-08 DIAGNOSIS — E861 Hypovolemia: Secondary | ICD-10-CM

## 2021-06-16 ENCOUNTER — Encounter: Payer: Self-pay | Admitting: Family Medicine

## 2021-06-17 MED ORDER — OSELTAMIVIR PHOSPHATE 75 MG PO CAPS: 75.0000 mg | ORAL_CAPSULE | Freq: Every day | ORAL | 0 refills | Status: DC

## 2021-06-23 ENCOUNTER — Other Ambulatory Visit: Payer: Self-pay

## 2021-06-23 ENCOUNTER — Encounter (HOSPITAL_BASED_OUTPATIENT_CLINIC_OR_DEPARTMENT_OTHER): Payer: Self-pay | Admitting: Cardiovascular Disease

## 2021-06-23 ENCOUNTER — Ambulatory Visit (HOSPITAL_BASED_OUTPATIENT_CLINIC_OR_DEPARTMENT_OTHER): Payer: 59 | Admitting: Cardiovascular Disease

## 2021-06-23 DIAGNOSIS — E861 Hypovolemia: Secondary | ICD-10-CM

## 2021-06-23 DIAGNOSIS — I9589 Other hypotension: Secondary | ICD-10-CM

## 2021-06-23 DIAGNOSIS — I951 Orthostatic hypotension: Secondary | ICD-10-CM

## 2021-06-23 MED ORDER — MIDODRINE HCL 5 MG PO TABS: 5.0000 mg | ORAL_TABLET | Freq: Three times a day (TID) | ORAL | 3 refills | Status: DC

## 2021-06-23 NOTE — Patient Instructions (Signed)
Medication Instructions:  DECREASE- Midodrine 5 mg by mouth three times a day  KEEP DAILY BLOOD PRESSURE CHECK IF BLOOD PRESSURE BEGINS TO INCREASE OK TO TAKE 10 MG OF MIDODRINE, BUT PLEASE IF OFFICE A CALL AND LETS [place] KNOW  *If you need a refill on your cardiac medications before your next appointment, please call your pharmacy*   Lab Work: None Ordered   Testing/Procedures: None Ordered   Follow-Up: At [facility], you and your health needs are our priority.  As part of our continuing mission to provide you with exceptional heart care, we have created designated Provider Care Teams.  These Care Teams include your primary Cardiologist (physician) and Advanced Practice Providers (APPs -  Physician Assistants and Nurse Practitioners) who all work together to provide you with the care you need, when you need it.  We recommend signing up for the patient portal called "MyChart".  Sign up information is provided on this After Visit Summary.  MyChart is used to connect with patients for Virtual Visits (Telemedicine).  Patients are able to view lab/test results, encounter notes, upcoming appointments, etc.  Non-urgent messages can be sent to your provider as well.   To learn more about what you can do with MyChart, go to [email or web address].    Your next appointment:   6 month(s)  The format for your next appointment:   In Person  Provider:   Skeet Latch, MD{

## 2021-06-23 NOTE — Assessment & Plan Note (Addendum)
Symptoms have improved significantly with treatment of her GERD.  She is gaining weight and eating much better.  Her blood pressure has normalized.  We will try to reduce her midodrine to 5 mg 3 times a day.  Continue fludrocortisone 0.1 mg twice daily.  If her blood pressure starts getting low when she is symptomatic she can go back to 10 mg of the midodrine, but I have asked her to give [place] a call let us know.  She was encouraged to maintain adequate fluid intake and salt intake.

## 2021-06-23 NOTE — Progress Notes (Signed)
Cardiology Office Note   Date:  06/23/2021   ID:  Stacy [name], DOB [birth year], MRN [id number]  PCP:  [name], MD  Cardiologist:   Madelin Rear   No chief complaint on file.  History of Present Illness: Stacy [name] is a 40 y.o. female  anxiety, unintentional weight loss, failure to thrive, GERD, and chronic intestinal pseudo-obstruction who presents for follow up.  She was seen in the hospital 05/2020 with profound orthostasis.  Ms. Lauman reports 7 years of unintentional weight loss.  Has had episodes of nausea and vomiting and inability to eat.  She continues to lose weight.  She was she was hospitalized for intestinal pseudo-obstruction.  This hospitalization she has been profoundly hypotensive and orthostatic, with the lowest recorded blood pressure 56/44.  She did receive several liters of IV fluids upon admission.  She has not received any in the last several days.  Her hospitalist team started her on midodrine and increase the dose to 10 mg 3 times daily.  She is also on fludrocortisone.  An echocardiogram that admission revealed LVEF 50 to 55% with no other significant abnormalities.  Her labs have been notable for mild anemia.  Hemoglobin slowly downtrending to 9.2.  Creatinine is low at 0.34.  Albumin is 2.7.  This was an improvement from 1.4 on admission.  Thyroid was normal 12/2019.  They plan to check for cortisol levels but were unable given her recent steroid use.  Cardiology was consulted for persistent orthostatic hypotension despite TED hose and fluid repletion.    At her last appointment she was doing much better.  She is working with a GI at [facility] and her PPI was changed.  She was able to eat regularly and had no more lightheadedness or dizziness. She was encouraged to keep adding salt to her foods, and was maintained on florinef and midodrine. She saw endocrinology and her cortisol stim test was normal.  Today she is feeling good overall. She remains  compliant with her medication and her blood pressure has been stable at home. Her appetite has improved and she is able to eat larger portions. Every once in a while she has nausea and emesis, but not as severely as prior. For exercise she walks whenever she can, usually a few times a week. At other times she is running around at home taking care of her children. Her husband has noticed that when she sleeps she sometimes develops a rapid heartbeat and night sweats. She denies any palpitations, chest pain, or shortness of breath. No lightheadedness, headaches, syncope, orthopnea, PND, lower extremity edema or exertional symptoms.   Past Medical History:  Diagnosis Date   Anxiety    Benign liver cyst    Chronic intestinal pseudo-obstruction    GERD (gastroesophageal reflux disease)    History of UTI     Past Surgical History:  Procedure Laterality Date   BIOPSY  05/20/2020   Procedure: BIOPSY;  Surgeon: Wonda Horner, MD;  Location: [facility];  Service: Endoscopy;;   CESAREAN SECTION     twice 2010, 2013   ESOPHAGOGASTRODUODENOSCOPY (EGD) WITH PROPOFOL N/A 05/20/2020   Procedure: ESOPHAGOGASTRODUODENOSCOPY (EGD) WITH PROPOFOL;  Surgeon: Wonda Horner, MD;  Location: [facility];  Service: Endoscopy;  Laterality: N/A;   Donato Heinz  ~2015     Current Outpatient Medications  Medication Sig Dispense Refill   acetaminophen (TYLENOL) 500 MG tablet Take 500 mg by mouth every 6 (six) hours as needed for headache (pain).  cholecalciferol (VITAMIN D) 25 MCG (1000 UNIT) tablet TAKE 2 TABLETS (2,000 UNITS TOTAL) BY MOUTH DAILY. 180 tablet 1   cyanocobalamin (,VITAMIN B-12,) 1000 MCG/ML injection Inject 1,000 mcg into the muscle every 30 (thirty) days.     famotidine (PEPCID) 20 MG tablet TAKE 1 TABLET BY MOUTH EVERYDAY AT BEDTIME 90 tablet 2   fludrocortisone (FLORINEF) 0.1 MG tablet Take 1 tablet (0.1 mg total) by mouth 2 (two) times daily. 180 tablet 1   Magnesium 500 MG TABS 1 tablet with a  meal     mirtazapine (REMERON) 15 MG tablet TAKE 1 TABLET BY MOUTH EVERYDAY AT BEDTIME 90 tablet 1   montelukast (SINGULAIR) 10 MG tablet Take 10 mg by mouth daily.     MOTEGRITY 2 MG TABS TAKE 1 TABLET BY MOUTH EVERY DAY 90 tablet 1   Multiple Vitamin (MULTIVITAMIN WITH MINERALS) TABS tablet Take 1 tablet by mouth daily.     ondansetron (ZOFRAN-ODT) 8 MG disintegrating tablet Take 8 mg by mouth 3 (three) times daily as needed for nausea/vomiting.     polyethylene glycol (MIRALAX / GLYCOLAX) 17 g packet Take 17 g by mouth 3 (three) times daily. 14 each 0   RABEprazole (ACIPHEX) 20 MG tablet Take by mouth.     sucralfate (CARAFATE) 1 g tablet Take 1 g by mouth 4 (four) times daily.     midodrine (PROAMATINE) 5 MG tablet Take 1 tablet (5 mg total) by mouth 3 (three) times daily. 270 tablet 3   No current facility-administered medications for this visit.    Allergies:   Tetracyclines & related, Erythromycin, Raspberry, Sulfa antibiotics, and Sulfonamide derivatives    Social History:  The patient  reports that she has never smoked. She has never used smokeless tobacco. She reports that she does not drink alcohol and does not use drugs.   Family History:  The patient's family history includes CAD in her father; Diabetes in her brother and father; Hypertension in her father.    ROS:   Please see the history of present illness. (+) Nausea (+) Emesis All other systems are reviewed and negative.    PHYSICAL EXAM: VS:  BP 112/72 (BP Location: Left Arm, Patient Position: Sitting, Cuff Size: Normal)   Pulse 74   Ht 5\' 3"  (1.6 m)   Wt 101 lb 6.4 oz (46 kg)   BMI 17.96 kg/m  , BMI Body mass index is 17.96 kg/m. GENERAL:  Well appearing HEENT:  Pupils equal round and reactive, fundi not visualized, oral mucosa unremarkable NECK:  No jugular venous distention, waveform within normal limits, carotid upstroke brisk and symmetric, no bruits LUNGS:  Clear to auscultation bilaterally HEART:   RRR.  PMI not displaced or sustained,S1 and S2 within normal limits, no S3, no S4, no clicks, no rubs, no murmurs ABD:  Flat, positive bowel sounds normal in frequency in pitch, no bruits, no rebound, no guarding, no midline pulsatile mass, no hepatomegaly, no splenomegaly EXT:  2 plus pulses throughout, no edema, no cyanosis no clubbing SKIN:  No rashes no nodules NEURO:  Cranial nerves II through XII grossly intact, motor grossly intact throughout PSYCH:  Cognitively intact, oriented to person place and time   EKG:   06/23/2021: Sinus rhythm. Rate 74 bpm. 09/08/2020: Sinus rhythm.  Rate 86 bpm.    Echo 05/24/20:  1. Grossly, no wall motiion abnormalities noted. Left ventricular  ejection fraction, by estimation, is 50 to 55%. The left ventricle has low  normal function. Left ventricular  endocardial border not optimally defined  to evaluate regional wall motion.  Left ventricular diastolic parameters were normal.   2. Right ventricular systolic function is normal. The right ventricular  size is normal.   3. The mitral valve is grossly normal. No evidence of mitral valve  regurgitation.   4. The aortic valve was not well visualized. Aortic valve regurgitation  is trivial.   5. The inferior vena cava is normal in size with greater than 50%  respiratory variability, suggesting right atrial pressure of 3 mmHg.   Recent Labs: 01/02/2021: ALT 8; BUN 16; Creatinine, Ser 0.44; Hemoglobin 11.3; Magnesium 1.9; Platelets 323.0; Potassium 3.9; Sodium 139; TSH 0.47    Lipid Panel    Component Value Date/Time   CHOL 139 01/02/2021 0940   TRIG 56.0 01/02/2021 0940   HDL 53.20 01/02/2021 0940   CHOLHDL 3 01/02/2021 0940   VLDL 11.2 01/02/2021 0940   LDLCALC 75 01/02/2021 0940      Wt Readings from Last 3 Encounters:  06/23/21 101 lb 6.4 oz (46 kg)  01/02/21 92 lb 9.6 oz (42 kg)  09/25/20 88 lb 9.6 oz (40.2 kg)      ASSESSMENT AND PLAN:  Orthostatic hypotension Symptoms have improved  significantly with treatment of her GERD.  She is gaining weight and eating much better.  Her blood pressure has normalized.  We will try to reduce her midodrine to 5 mg 3 times a day.  Continue fludrocortisone 0.1 mg twice daily.  If her blood pressure starts getting low when she is symptomatic she can go back to 10 mg of the midodrine, but I have asked her to give [place] a call let us know.  She was encouraged to maintain adequate fluid intake and salt intake.    Current medicines are reviewed at length with the patient today.  The patient does not have concerns regarding medicines.  The following changes have been made:  reduce midodrine to 5mg  tid  Labs/ tests ordered today include:   Orders Placed This Encounter  Procedures   EKG 12-Lead      Disposition:   FU with Tiphanie Vo C. Oval Linsey, MD, [facility] in 6 months.   I,Mathew Stumpf,acting as a Education administrator for Skeet Latch, MD.,have documented all relevant documentation on the behalf of Skeet Latch, MD,as directed by  Skeet Latch, MD while in the presence of Skeet Latch, MD.  I, [place] Oval Linsey, MD have reviewed all documentation for this visit.  The documentation of the exam, diagnosis, procedures, and orders on 06/23/2021 are all accurate and complete.   Signed, Neria Procter C. Oval Linsey, MD, [facility]  06/23/2021 11:53 AM    [place]

## 2021-07-07 ENCOUNTER — Ambulatory Visit (INDEPENDENT_AMBULATORY_CARE_PROVIDER_SITE_OTHER): Payer: 59 | Admitting: Registered Nurse

## 2021-07-07 DIAGNOSIS — E539 Vitamin B deficiency, unspecified: Secondary | ICD-10-CM

## 2021-07-07 MED ORDER — CYANOCOBALAMIN 1000 MCG/ML IJ SOLN
1000.0000 ug | INTRAMUSCULAR | Status: DC
  Administered 2021-07-07 – 2023-04-21 (×7): 1000 ug via INTRAMUSCULAR

## 2021-07-07 NOTE — Progress Notes (Signed)
Patient was administered 1000 mcg B12 via intramuscular injection to the right deltoid without complications. No reported concerns, patient will return in 4 weeks for next dose.

## 2021-07-28 ENCOUNTER — Other Ambulatory Visit: Payer: Self-pay

## 2021-07-28 MED ORDER — MOTEGRITY 2 MG PO TABS: 1.0000 | ORAL_TABLET | Freq: Every day | ORAL | 1 refills | Status: DC

## 2021-08-06 ENCOUNTER — Other Ambulatory Visit: Payer: Self-pay | Admitting: Family Medicine

## 2021-08-06 ENCOUNTER — Ambulatory Visit (INDEPENDENT_AMBULATORY_CARE_PROVIDER_SITE_OTHER): Payer: 59 | Admitting: Family Medicine

## 2021-08-06 DIAGNOSIS — E539 Vitamin B deficiency, unspecified: Secondary | ICD-10-CM | POA: Diagnosis not present

## 2021-08-06 MED ORDER — CYANOCOBALAMIN 1000 MCG/ML IJ SOLN: 1000.0000 ug | Freq: Once | INTRAMUSCULAR | Status: DC

## 2021-08-06 NOTE — Progress Notes (Signed)
Stacy [name] is a 40 y.o. female presents to the office today for B12 injections, per physician's orders. Patient tolerated well  Stacy [name]

## 2021-08-12 ENCOUNTER — Telehealth: Payer: Self-pay | Admitting: Family Medicine

## 2021-08-12 ENCOUNTER — Other Ambulatory Visit: Payer: Self-pay

## 2021-08-12 MED ORDER — FLUDROCORTISONE ACETATE 0.1 MG PO TABS: 0.1000 mg | ORAL_TABLET | Freq: Two times a day (BID) | ORAL | 1 refills | Status: DC

## 2021-08-12 NOTE — Telephone Encounter (Signed)
PT called in asking for a new script of the Fludrocortisone .1MG  to be sent to the [facility] in [place]. Please advise   She is aware Birdie Riddle is not in the office today

## 2021-08-12 NOTE — Telephone Encounter (Signed)
Sent rx.

## 2021-08-21 ENCOUNTER — Ambulatory Visit: Payer: 59 | Admitting: Family Medicine

## 2021-09-08 ENCOUNTER — Ambulatory Visit (INDEPENDENT_AMBULATORY_CARE_PROVIDER_SITE_OTHER): Payer: 59 | Admitting: Family Medicine

## 2021-09-08 DIAGNOSIS — E538 Deficiency of other specified B group vitamins: Secondary | ICD-10-CM

## 2021-09-08 DIAGNOSIS — E539 Vitamin B deficiency, unspecified: Secondary | ICD-10-CM | POA: Diagnosis not present

## 2021-09-08 NOTE — Progress Notes (Signed)
Administered 1 cc cyanocobalamin 1065mcg/ml to the Rt Deltoid. Well tolerated. Pt will schedule next appt before leaving office today.

## 2021-09-09 ENCOUNTER — Emergency Department (HOSPITAL_COMMUNITY): Payer: 59

## 2021-09-09 ENCOUNTER — Encounter (HOSPITAL_BASED_OUTPATIENT_CLINIC_OR_DEPARTMENT_OTHER): Payer: Self-pay | Admitting: Emergency Medicine

## 2021-09-09 ENCOUNTER — Other Ambulatory Visit: Payer: Self-pay

## 2021-09-09 ENCOUNTER — Emergency Department (HOSPITAL_BASED_OUTPATIENT_CLINIC_OR_DEPARTMENT_OTHER)
Admission: EM | Admit: 2021-09-09 | Discharge: 2021-09-09 | Disposition: A | Discharge status: Other Acute Inpatient Hospital | Payer: 59 | Attending: Emergency Medicine | Admitting: Emergency Medicine

## 2021-09-09 DIAGNOSIS — H5700 Unspecified anomaly of pupillary function: Secondary | ICD-10-CM | POA: Insufficient documentation

## 2021-09-09 DIAGNOSIS — Z20822 Contact with and (suspected) exposure to covid-19: Secondary | ICD-10-CM | POA: Diagnosis not present

## 2021-09-09 DIAGNOSIS — H02402 Unspecified ptosis of left eyelid: Secondary | ICD-10-CM | POA: Diagnosis not present

## 2021-09-09 DIAGNOSIS — H5702 Anisocoria: Secondary | ICD-10-CM

## 2021-09-09 DIAGNOSIS — H579 Unspecified disorder of eye and adnexa: Secondary | ICD-10-CM | POA: Diagnosis present

## 2021-09-09 LAB — CBC WITH DIFFERENTIAL/PLATELET
Abs Immature Granulocytes: 0.01 10*3/uL (ref 0.00–0.07)
Basophils Absolute: 0 10*3/uL (ref 0.0–0.1)
Basophils Relative: 0 %
Eosinophils Absolute: 0.1 10*3/uL (ref 0.0–0.5)
Eosinophils Relative: 1 %
HCT: 34.2 % — ABNORMAL LOW (ref 36.0–46.0)
Hemoglobin: 10.8 g/dL — ABNORMAL LOW (ref 12.0–15.0)
Immature Granulocytes: 0 %
Lymphocytes Relative: 30 %
Lymphs Abs: 1.8 10*3/uL (ref 0.7–4.0)
MCH: 26.9 pg (ref 26.0–34.0)
MCHC: 31.6 g/dL (ref 30.0–36.0)
MCV: 85.3 fL (ref 80.0–100.0)
Monocytes Absolute: 0.6 10*3/uL (ref 0.1–1.0)
Monocytes Relative: 10 %
Neutro Abs: 3.6 10*3/uL (ref 1.7–7.7)
Neutrophils Relative %: 59 %
Platelets: 333 10*3/uL (ref 150–400)
RBC: 4.01 MIL/uL (ref 3.87–5.11)
RDW: 14.5 % (ref 11.5–15.5)
WBC: 6.2 10*3/uL (ref 4.0–10.5)
nRBC: 0 % (ref 0.0–0.2)

## 2021-09-09 LAB — COMPREHENSIVE METABOLIC PANEL
ALT: 9 U/L (ref 0–44)
AST: 14 U/L — ABNORMAL LOW (ref 15–41)
Albumin: 4 g/dL (ref 3.5–5.0)
Alkaline Phosphatase: 67 U/L (ref 38–126)
Anion gap: 8 (ref 5–15)
BUN: 13 mg/dL (ref 6–20)
CO2: 30 mmol/L (ref 22–32)
Calcium: 9.2 mg/dL (ref 8.9–10.3)
Chloride: 101 mmol/L (ref 98–111)
Creatinine, Ser: 0.49 mg/dL (ref 0.44–1.00)
GFR, Estimated: >60 mL/min (ref >60)
Glucose, Bld: 90 mg/dL (ref 70–99)
Potassium: 3.6 mmol/L (ref 3.5–5.1)
Sodium: 139 mmol/L (ref 135–145)
Total Bilirubin: 0.9 mg/dL (ref 0.3–1.2)
Total Protein: 6.6 g/dL (ref 6.5–8.1)

## 2021-09-09 LAB — RESP PANEL BY RT-PCR (FLU A&B, COVID) ARPGX2
Influenza A by PCR: NEGATIVE
Influenza B by PCR: NEGATIVE
SARS Coronavirus 2 by RT PCR: NEGATIVE

## 2021-09-09 LAB — SEDIMENTATION RATE: Sed Rate: 22 mm/hr (ref 0–22)

## 2021-09-09 LAB — C-REACTIVE PROTEIN: CRP: 0.5 mg/dL (ref <1.0)

## 2021-09-09 MED ORDER — GADOBUTROL 1 MMOL/ML IV SOLN
4.0000 mL | Freq: Once | INTRAVENOUS | Status: AC | PRN
  Administered 2021-09-09: 15:00:00 4 mL via INTRAVENOUS

## 2021-09-09 NOTE — ED Provider Notes (Signed)
Care of the patient assumed on transfer from [facility] with abnormal pupil exam and ptosis. She is currently in MRI.  Physical Exam  BP 113/74 (BP Location: Right Arm)    Pulse 99    Temp 98.8 F (37.1 C) (Oral)    Resp 18    Ht 5\' 3"  (1.6 m)    Wt 45.8 kg    SpO2 100%    BMI 17.89 kg/m   Physical Exam  Procedures  Procedures  ED Course / MDM   Clinical Course as of 09/09/21 1612  Wed Sep 09, 2021  1605 Care signed out to Dr. Darl Householder at shift change pending MRI results and neuro consult.  [CS]    Clinical Course User Index [CS] Truddie Hidden, MD   Medical Decision Making Amount and/or Complexity of Data Reviewed Labs: ordered. Radiology: ordered.  Risk Prescription drug management.          Truddie Hidden, MD 09/09/21 [phone]

## 2021-09-09 NOTE — ED Provider Notes (Signed)
°  Physical Exam  BP 113/74 (BP Location: Right Arm)    Pulse 99    Temp 98.8 F (37.1 C) (Oral)    Resp 18    Ht 5\' 3"  (1.6 m)    Wt 45.8 kg    SpO2 100%    BMI 17.89 kg/m   Physical Exam  Procedures  Procedures  ED Course / MDM   Clinical Course as of 09/09/21 1619  Wed Sep 09, 2021  1605 Care signed out to Dr. Darl Householder at shift change pending MRI results and neuro consult.  [CS]    Clinical Course User Index [CS] Truddie Hidden, MD   Medical Decision Making Care assumed at 4 PM.  Patient sent from ophthalmology to drop back for ptosis of the left eye.  Signed out pending inflammatory markers as well as MRI brain and orbits with and without contrast multiple sclerosis  4:20 PM MRI did not show any acute findings.  The ptosis of the left eye may be primary eye issue.  Patient has seen Dr. Monica Martinez from ophtho.  At this point, I think patient is stable for follow-up with ophthalmology outpatient.  Patient may need to be referred to a subspecialist for ptosis   Problems Addressed: Ptosis of eyelid, left: acute illness or injury  Amount and/or Complexity of Data Reviewed Labs: ordered. Radiology: ordered.  Risk Prescription drug management.         Drenda Freeze, MD 09/09/21 [phone]

## 2021-09-09 NOTE — ED Notes (Signed)
Triage RN spoke w pt regarding plan of care. Called MRI

## 2021-09-09 NOTE — ED Provider Notes (Addendum)
[facility] EMERGENCY DEPT Provider Note   CSN: [id number] Arrival date & time: 09/09/21  5573     History  Chief Complaint  Patient presents with   Eye Problem    Stacy [name] is a 41 y.o. female.  The history is provided by the patient and medical records. No language interpreter was used.  Eye Problem Location:  Both eyes Severity:  Moderate Duration:  7 days Timing:  Constant Progression:  Improving Chronicity:  New Relieved by:  Nothing Worsened by:  Nothing Ineffective treatments:  None tried Associated symptoms: no blurred vision, no crusting (resolved), no decreased vision, no discharge (resolved), no double vision, no headaches, no nausea, no numbness, no photophobia, no redness (resolved), no swelling (resolved), no tearing, no tingling, no vomiting and no weakness   Risk factors: recent URI       Home Medications Prior to Admission medications   Medication Sig Start Date End Date Taking? Authorizing Provider  acetaminophen (TYLENOL) 500 MG tablet Take 500 mg by mouth every 6 (six) hours as needed for headache (pain).    [provider]  cholecalciferol (VITAMIN D) 25 MCG (1000 UNIT) tablet TAKE 2 TABLETS (2,000 UNITS TOTAL) BY MOUTH DAILY. 04/13/21   [name], MD  cyanocobalamin (,VITAMIN B-12,) 1000 MCG/ML injection Inject 1,000 mcg into the muscle every 30 (thirty) days.    [provider]  famotidine (PEPCID) 20 MG tablet TAKE 1 TABLET BY MOUTH EVERYDAY AT BEDTIME 08/01/20   [name], MD  fludrocortisone (FLORINEF) 0.1 MG tablet Take 1 tablet (0.1 mg total) by mouth 2 (two) times daily. 08/12/21   [name], MD  Magnesium 500 MG TABS 1 tablet with a meal    [provider]  midodrine (PROAMATINE) 5 MG tablet Take 1 tablet (5 mg total) by mouth 3 (three) times daily. 06/23/21   Skeet Latch, MD  mirtazapine (REMERON) 15 MG tablet TAKE 1 TABLET BY MOUTH EVERYDAY AT BEDTIME 04/23/21    [name], MD  montelukast (SINGULAIR) 10 MG tablet Take 10 mg by mouth daily. 01/08/20   [provider]  Multiple Vitamin (MULTIVITAMIN WITH MINERALS) TABS tablet Take 1 tablet by mouth daily. 05/29/20   Eugenie Filler, MD  ondansetron (ZOFRAN-ODT) 8 MG disintegrating tablet Take 8 mg by mouth 3 (three) times daily as needed for nausea/vomiting. 05/12/20   [provider]  polyethylene glycol (MIRALAX / GLYCOLAX) 17 g packet Take 17 g by mouth 3 (three) times daily. 05/28/20   Eugenie Filler, MD  Prucalopride Succinate (MOTEGRITY) 2 MG TABS Take 1 tablet (2 mg total) by mouth daily. 07/28/21   [name], MD  RABEprazole (ACIPHEX) 20 MG tablet Take by mouth. 06/25/20 06/23/21  [provider]  sucralfate (CARAFATE) 1 g tablet Take 1 g by mouth 4 (four) times daily. 12/30/20   [provider]      Allergies    Tetracyclines & related, Erythromycin, Raspberry, Sulfa antibiotics, and Sulfonamide derivatives    Review of Systems   Review of Systems  Constitutional:  Negative for chills, fatigue and fever.  HENT:  Positive for facial swelling (resolved now). Negative for ear discharge, ear pain, rhinorrhea, sinus pain and trouble swallowing.   Eyes:  Negative for blurred vision, double vision, photophobia, discharge (resolved) and redness (resolved).  Respiratory:  Negative for cough, chest tightness, shortness of breath and wheezing.   Cardiovascular:  Negative for chest pain.  Gastrointestinal:  Negative for abdominal pain,  nausea and vomiting.  Genitourinary:  Negative for flank pain.  Musculoskeletal:  Negative for back pain and neck pain.  Neurological:  Negative for dizziness, tingling, weakness, light-headedness, numbness and headaches.  Psychiatric/Behavioral:  Negative for agitation and confusion.   All other systems reviewed and are negative.  Physical Exam Updated Vital Signs BP (!) 167/118 (BP Location: Right Arm)     Pulse 83    Temp 98 F (36.7 C) (Oral)    Resp 18    Ht 5' 3"  (1.6 m)    Wt 45.8 kg    SpO2 98%    BMI 17.89 kg/m  Physical Exam Vitals and nursing note reviewed.  Constitutional:      General: She is not in acute distress.    Appearance: She is well-developed. She is not ill-appearing, toxic-appearing or diaphoretic.  Eyes:     General: No scleral icterus.       Right eye: No discharge.        Left eye: No discharge.     Extraocular Movements:     Right eye: Normal extraocular motion and no nystagmus.     Left eye: Normal extraocular motion and no nystagmus.     Conjunctiva/sclera: Conjunctivae normal.     Right eye: Right conjunctiva is not injected.     Left eye: Left conjunctiva is not injected.     Pupils: Pupils are unequal.     Right eye: Pupil is not reactive.     Left eye: Pupil is not reactive.     Comments: Left eye ptosis.  Right eye is more dull than left but neither will constrict or dilate.  Normal extraocular movements.  No pain.  No redness.  Cardiovascular:     Rate and Rhythm: Normal rate and regular rhythm.     Heart sounds: No murmur heard. Pulmonary:     Effort: Pulmonary effort is normal. No respiratory distress.     Breath sounds: Normal breath sounds.  Abdominal:     Palpations: Abdomen is soft.     Tenderness: There is no abdominal tenderness.  Musculoskeletal:        General: No swelling.     Cervical back: Neck supple.  Skin:    General: Skin is warm and dry.     Capillary Refill: Capillary refill takes less than 2 seconds.  Neurological:     Mental Status: She is alert.  Psychiatric:        Mood and Affect: Mood normal.    ED Results / Procedures / Treatments   Labs (all labs ordered are listed, but only abnormal results are displayed) Labs Reviewed  RESP PANEL BY RT-PCR (FLU A&B, COVID) ARPGX2  CBC WITH DIFFERENTIAL/PLATELET  COMPREHENSIVE METABOLIC PANEL  SEDIMENTATION RATE  C-REACTIVE PROTEIN    EKG None  Radiology No results  found.  Procedures Procedures    Medications Ordered in ED Medications - No data to display  ED Course/ Medical Decision Making/ A&P                           Medical Decision Making Amount and/or Complexity of Data Reviewed Labs: ordered. Radiology: ordered.   Stacy [name] is a 41 y.o. female with a past medical history significant for GERD, previous benign liver cyst, adrenal insufficiency, and anxiety who presents from her ophthalmologist office for evaluation of abnormal neurologic exam.  According to patient, a week and a half ago she had what she  thought was a viral conjunctivitis with swelling, redness, and pain in her left eye.  She had crusting and the area around her eye was swollen.  She saw an urgent care and was started on antibiotics and some steroids.  She says that the symptoms began to improve from a swelling and redness and discomfort standpoint and now she has no pain however her left eyelid has been drooping and her eyes have looked different.  She went to an optometrist on Sunday who recommend she go to an ophthalmologist whom she saw today.  Per the patient report, the ophthalmologist did not do any dilation of the eyes but both of her eyes are not reacting neither with dilation or constriction.  They were concerned about multiple etiologies that could be causing this abnormal exam.  She continues to report that the ophthalmologist did not do any dilation during the exam today.  She denies any eye pains and denies any vision changes.  Denies diplopia.  Denies any headaches.  Denies any recent trauma.  She does report that a cousin has had MS.  On my exam, I did not appreciate an asymmetric smile but she does have ptosis of the left eye.  She is able to close it and she has some weakness in the left forehead compared to the right.  Neither pupil is responding to light and the right is more dilated than the left.  She had normal extraocular movements with no pain  with movement.   normal conjunctiva on exam.  Lungs clear, chest nontender.  No focal neurologic deficits otherwise.  No carotid bruit appreciated.  Patient otherwise well-appearing.  We will touch base with neurology given the upper facial ptosis sparing the left lower face as well as the pupils that are not responding to light.  Given her recent infection near the eye, I am concerned she may have some sort of neuritis from a viral infection causing the symptoms.  As she is not having severe pain, swelling, or redness I have less suspicion for acute infection such as a orbital cellulitis or abscess.  Given lack of trauma, I have less suspicion that CT imaging will be as beneficial as possible MRI imaging.  Await discussion with neurology to discuss further imaging.  Spoke with Dr. Cheral Marker with neurology who does recommend ED to ED transfer to [facility] for advanced imaging with MRI.  He recommends MRI with and without contrast of the brain and orbits as well as some basic labs.  Due to possible auto inflammatory troubles or MS or neuritis, will get ESR and CRP as well as basic labs.  Based on these findings, neurology feels she may need ENT consult, ophthalmology consult for second ophthalmologic opinion, or even neurology consult if it looks like a primary neurological problem.  Patient will be transferred with IV in place by personal vehicle as this is gone for several days and otherwise she appears stable from a vital sign standpoint.  Patient agreed with plan of care and will go straight to [facility] for evaluation.  ED to ED transfer was accepted by Dr. Armandina Gemma.       Final Clinical Impression(s) / ED Diagnoses Final diagnoses:  Ptosis of eyelid, left  Pupil asymmetry  Pupillary function, abnormal    Clinical Impression: 1. Ptosis of eyelid, left   2. Pupil asymmetry   3. Pupillary function, abnormal     Disposition: ED to ED transfer for more advanced imaging and reassessment  to determine  disposition.  This note was prepared with assistance of Systems analyst. Occasional wrong-word or sound-a-like substitutions may have occurred due to the inherent limitations of voice recognition software.      Stacy [name], Gwenyth Allegra, MD 09/09/21 1200    Autie Vasudevan, Gwenyth Allegra, MD 09/09/21 1228

## 2021-09-09 NOTE — ED Triage Notes (Addendum)
Woke up last Thursday  with  left eye droop ,  went to [facility] , given eye drops , no better Sunday went to [facility] , was seen at opthamologist this am and sent here for further test  left eye pupil not contracting per  no blurred vision , no weakness  no dizziness

## 2021-09-09 NOTE — ED Notes (Signed)
Labs drawn and sent to lab to be held

## 2021-09-09 NOTE — ED Notes (Signed)
Pt instructed to report directly to [facility] ED, to remain NPO,  and not to make additional stops. Pt instructed not to tamper with IV. Pt's husband to drive pt and is aware on how to get to destination.

## 2021-09-09 NOTE — Discharge Instructions (Addendum)
Your MRI of your eye and brain did not show any signs of optic neuritis or multiple sclerosis.  You need to follow back up with Dr. Eulas Post.  You may need to be referred to a subspecialist that specializes in eyelids  Return to ER if you have blurry vision, unable to see out of the left eye, eye pain, weakness or numbness

## 2021-09-09 NOTE — ED Notes (Signed)
Pt verbalizes understanding of discharge instructions. Opportunity for questions and answers were provided. Pt discharged from the ED.   ?

## 2021-09-09 NOTE — ED Notes (Signed)
IV secured for transport

## 2021-09-09 NOTE — ED Notes (Signed)
Patient transported to MRI 

## 2021-10-07 ENCOUNTER — Encounter: Payer: Self-pay | Admitting: *Deleted

## 2021-10-09 ENCOUNTER — Ambulatory Visit (INDEPENDENT_AMBULATORY_CARE_PROVIDER_SITE_OTHER): Payer: 59 | Admitting: Family Medicine

## 2021-10-09 DIAGNOSIS — E538 Deficiency of other specified B group vitamins: Secondary | ICD-10-CM

## 2021-10-09 NOTE — Progress Notes (Signed)
Patient came into the office to get Vitamin B-12 injection in right deltoid per Dr. Birdie Riddle. Patient tolerated the well and will return in 30 days

## 2021-10-13 ENCOUNTER — Ambulatory Visit: Payer: 59 | Admitting: Diagnostic Neuroimaging

## 2021-10-13 ENCOUNTER — Encounter: Payer: Self-pay | Admitting: Diagnostic Neuroimaging

## 2021-10-13 VITALS — BP 91/61 | HR 96 | Ht 63.0 in | Wt 104.0 lb

## 2021-10-13 DIAGNOSIS — H57053 Tonic pupil, bilateral: Secondary | ICD-10-CM | POA: Diagnosis not present

## 2021-10-13 DIAGNOSIS — H00036 Abscess of eyelid left eye, unspecified eyelid: Secondary | ICD-10-CM

## 2021-10-13 NOTE — Progress Notes (Signed)
[facility]  PATIENT: Stacy [name] DOB: 1980/11/03  REFERRING CLINICIAN: Lonia Skinner, MD HISTORY FROM: patient and husband REASON FOR VISIT: new consult   HISTORICAL  CHIEF COMPLAINT:  Chief Complaint  Patient presents with   R/O third nerve palsy    Rm 7 New Pt  Husband- Emad    HISTORY OF PRESENT ILLNESS:   41 year old female here for evaluation of left eyelid swelling, ptosis, nonreactive pupils.  09/03/2021 patient woke up with left eye swollen shut.  A week earlier she had nasal congestion, headache, drainage, diagnosed with sinusitis and started on antibiotics.  Patient went to urgent care, then optometry and then ophthalmology.  On 09/09/2021 she saw the ophthalmologist who referred patient to ER to rule out intracranial pathologies.  Patient was also noted to have bilateral dilated, unresponsive pupils.  MRI of the brain and orbits were negative.  Over the next 2 days symptoms resolved.  Patient denies any issues related to contralateral ptosis, double vision, speech or swallowing difficulties.  No problems with arms or legs.   REVIEW OF SYSTEMS: Full 14 system review of systems performed and negative with exception of: As per HPI.  ALLERGIES: Allergies  Allergen Reactions   Tetracyclines & Related Swelling    Swelling in spine; required spinal tap.    Erythromycin Other (See Comments)    Swelling in spine; required spinal tap   Raspberry Rash   Sulfa Antibiotics Rash    hives   Sulfonamide Derivatives Rash    Reaction to cream    HOME MEDICATIONS: Outpatient Medications Prior to Visit  Medication Sig Dispense Refill   acetaminophen (TYLENOL) 500 MG tablet Take 500 mg by mouth every 6 (six) hours as needed for headache (pain).     cholecalciferol (VITAMIN D) 25 MCG (1000 UNIT) tablet TAKE 2 TABLETS (2,000 UNITS TOTAL) BY MOUTH DAILY. 180 tablet 1   cyanocobalamin (,VITAMIN B-12,) 1000 MCG/ML injection Inject 1,000 mcg into the  muscle every 30 (thirty) days.     famotidine (PEPCID) 20 MG tablet TAKE 1 TABLET BY MOUTH EVERYDAY AT BEDTIME 90 tablet 2   fludrocortisone (FLORINEF) 0.1 MG tablet Take 1 tablet (0.1 mg total) by mouth 2 (two) times daily. 180 tablet 1   midodrine (PROAMATINE) 5 MG tablet Take 1 tablet (5 mg total) by mouth 3 (three) times daily. 270 tablet 3   mirtazapine (REMERON) 15 MG tablet TAKE 1 TABLET BY MOUTH EVERYDAY AT BEDTIME 90 tablet 1   montelukast (SINGULAIR) 10 MG tablet Take 10 mg by mouth daily.     Multiple Vitamin (MULTIVITAMIN WITH MINERALS) TABS tablet Take 1 tablet by mouth daily.     polyethylene glycol (MIRALAX / GLYCOLAX) 17 g packet Take 17 g by mouth 3 (three) times daily. 14 each 0   Prucalopride Succinate (MOTEGRITY) 2 MG TABS Take 1 tablet (2 mg total) by mouth daily. 90 tablet 1   RABEprazole (ACIPHEX) 20 MG tablet Take by mouth.     sucralfate (CARAFATE) 1 g tablet Take 1 g by mouth 4 (four) times daily.     Magnesium 500 MG TABS 1 tablet with a meal (Patient not taking: Reported on 10/13/2021)     Facility-Administered Medications Prior to Visit  Medication Dose Route Frequency Provider Last Rate Last Admin   cyanocobalamin ((VITAMIN B-12)) injection 1,000 mcg  1,000 mcg Intramuscular Q30 days [name], MD   1,000 mcg at 10/09/21 1015   cyanocobalamin ((VITAMIN B-12)) injection 1,000 mcg  1,000  mcg Intramuscular Once [name], MD        PAST MEDICAL HISTORY: Past Medical History:  Diagnosis Date   Anxiety    Benign liver cyst    Chronic intestinal pseudo-obstruction    GERD (gastroesophageal reflux disease)    History of UTI     PAST SURGICAL HISTORY: Past Surgical History:  Procedure Laterality Date   BIOPSY  05/20/2020   Procedure: BIOPSY;  Surgeon: Wonda Horner, MD;  Location: [facility];  Service: Endoscopy;;   CESAREAN SECTION     twice 2010, 2013   ESOPHAGOGASTRODUODENOSCOPY (EGD) WITH PROPOFOL N/A 05/20/2020   Procedure:  ESOPHAGOGASTRODUODENOSCOPY (EGD) WITH PROPOFOL;  Surgeon: Wonda Horner, MD;  Location: [facility];  Service: Endoscopy;  Laterality: N/A;   Donato Heinz  ~2015    FAMILY HISTORY: Family History  Problem Relation Age of Onset   CAD Father    Hypertension Father    Diabetes Father    Diabetes Brother    Colon cancer Neg Hx    Breast cancer Neg Hx    Adrenal disorder Neg Hx     SOCIAL HISTORY: Social History   Socioeconomic History   Marital status: Married    Spouse name: Emad   Number of children: 2   Years of education: Not on file   Highest education level: Not on file  Occupational History   Occupation: stay home ; associate degree paralega   Tobacco Use   Smoking status: Never   Smokeless tobacco: Never  Vaping Use   Vaping Use: Never used  Substance and Sexual Activity   Alcohol use: No    Comment: socially    Drug use: No   Sexual activity: Yes    Birth control/protection: None  Other Topics Concern   Not on file  Social History Narrative   Household: pt, husband , 2 children   2 boys: 2010, 2013   P2G2   Original  from [place], no foreign trips   Social Determinants of Radio broadcast assistant Strain: Not on [employer] Insecurity: Not on file  Transportation Needs: Not on file  Physical Activity: Not on file  Stress: Not on file  Social Connections: Not on file  Intimate Partner Violence: Not on file     PHYSICAL EXAM  GENERAL EXAM/CONSTITUTIONAL: Vitals:  Vitals:   10/13/21 1510  BP: 91/61  Pulse: 96  Weight: 104 lb (47.2 kg)  Height: 5\' 3"  (1.6 m)   Body mass index is 18.42 kg/m. Wt Readings from Last 3 Encounters:  10/13/21 104 lb (47.2 kg)  09/09/21 101 lb (45.8 kg)  06/23/21 101 lb 6.4 oz (46 kg)   Patient is in no distress; well developed, nourished and groomed; neck is supple  CARDIOVASCULAR: Examination of carotid arteries is normal; no carotid bruits Regular rate and rhythm, no murmurs Examination of peripheral vascular  system by observation and palpation is normal  EYES: Ophthalmoscopic exam of optic discs and posterior segments is normal; no papilledema or hemorrhages No results found.  MUSCULOSKELETAL: Gait, strength, tone, movements noted in Neurologic exam below  NEUROLOGIC: MENTAL STATUS:  No flowsheet data found. awake, alert, oriented to person, place and time recent and remote memory intact normal attention and concentration language fluent, comprehension intact, naming intact fund of knowledge appropriate  CRANIAL NERVE:  2nd - no papilledema on fundoscopic exam 2nd, 3rd, 4th, 6th - pupils equal and reactive to light, visual fields full to confrontation, extraocular muscles intact, no nystagmus 5th -  facial sensation symmetric 7th - facial strength symmetric 8th - hearing intact 9th - palate elevates symmetrically, uvula midline 11th - shoulder shrug symmetric 12th - tongue protrusion midline  MOTOR:  normal bulk and tone, full strength in the BUE, BLE  SENSORY:  normal and symmetric to light touch, pinprick, temperature, vibration  COORDINATION:  finger-nose-finger, fine finger movements normal  REFLEXES:  deep tendon reflexes TRACE IN ARMS; ABSENT IN LEGS  GAIT/STATION:  narrow based gait     DIAGNOSTIC DATA (LABS, IMAGING, TESTING) - I reviewed patient records, labs, notes, testing and imaging myself where available.  Lab Results  Component Value Date   WBC 6.2 09/09/2021   HGB 10.8 (L) 09/09/2021   HCT 34.2 (L) 09/09/2021   MCV 85.3 09/09/2021   PLT 333 09/09/2021      Component Value Date/Time   NA 139 09/09/2021 1030   NA 137 01/15/2019 0000   K 3.6 09/09/2021 1030   CL 101 09/09/2021 1030   CO2 30 09/09/2021 1030   GLUCOSE 90 09/09/2021 1030   BUN 13 09/09/2021 1030   BUN 15 01/15/2019 0000   CREATININE 0.49 09/09/2021 1030   CALCIUM 9.2 09/09/2021 1030   PROT 6.6 09/09/2021 1030   ALBUMIN 4.0 09/09/2021 1030   AST 14 (L) 09/09/2021 1030   ALT  9 09/09/2021 1030   ALKPHOS 67 09/09/2021 1030   BILITOT 0.9 09/09/2021 1030   GFRNONAA >60 09/09/2021 1030   GFRAA >60 05/20/2020 0502   Lab Results  Component Value Date   CHOL 139 01/02/2021   HDL 53.20 01/02/2021   LDLCALC 75 01/02/2021   TRIG 56.0 01/02/2021   CHOLHDL 3 01/02/2021   Lab Results  Component Value Date   HGBA1C 5.1 07/28/2013   Lab Results  Component Value Date   VITAMINB12 >1550 (H) 01/02/2021   Lab Results  Component Value Date   TSH 0.47 01/02/2021    09/09/21 MRI brain and orbits [I reviewed images myself and agree with interpretation. -VRP]  -Normal    ASSESSMENT AND PLAN  41 y.o. year old female here with:   Dx:  1. Cellulitis of left eyelid   2. Adie's tonic pupil, bilateral       PLAN:  LEFT EYELID SWELLING (likely cellulitis from sinus infection) - resolved; monitor  ADIE'S TONIC PUPIL (hyporeflexia) - normal variant   Return for pending if symptoms worsen or fail to improve.    Penni Bombard, MD 4/50/3888, 2:80 PM Certified in Neurology, Neurophysiology and Neuroimaging  [facility] [address], [place], [place] [address] [phone]

## 2021-10-27 ENCOUNTER — Ambulatory Visit: Payer: 59 | Admitting: Family Medicine

## 2021-10-27 ENCOUNTER — Encounter: Payer: Self-pay | Admitting: Family Medicine

## 2021-10-27 VITALS — BP 100/70 | HR 87 | Temp 97.7°F | Resp 16 | Wt 101.6 lb

## 2021-10-27 DIAGNOSIS — J329 Chronic sinusitis, unspecified: Secondary | ICD-10-CM

## 2021-10-27 DIAGNOSIS — B079 Viral wart, unspecified: Secondary | ICD-10-CM | POA: Diagnosis not present

## 2021-10-27 DIAGNOSIS — B9689 Other specified bacterial agents as the cause of diseases classified elsewhere: Secondary | ICD-10-CM

## 2021-10-27 MED ORDER — AMOXICILLIN 875 MG PO TABS: 875.0000 mg | ORAL_TABLET | Freq: Two times a day (BID) | ORAL | 0 refills | Status: AC

## 2021-10-27 NOTE — Progress Notes (Signed)
? ?Subjective:  ? ? Patient ID: Stacy [name], female    DOB: Nov 30, 1980, 41 y.o.   MRN: [id number] ? ?HPI ?URI- pt reports funny nose, sinus headache, 'my head is throbbing'.  Son is also home sick.  Sxs started Sunday w/ nasal congestion.  + fatigue.  Not currently on Claritin or Zyrtec.  No tooth pain.  Denies ear pain.  + frontal and maxillary sinus pain.  Drainage is dark yellow.  No N/V.  + dry cough. ? ?Wart- L middle finger.  Pt used OTC freeze tx and it got crusty.  Now using OTC wart removal strips and wart is bright white.  Not painful.   ? ? ?Review of Systems ?For ROS see HPI  ? ?This visit occurred during the SARS-CoV-2 public health emergency.  Safety protocols were in place, including screening questions prior to the visit, additional usage of staff PPE, and extensive cleaning of exam room while observing appropriate contact time as indicated for disinfecting solutions.   ?   ?Objective:  ? Physical Exam ?Vitals reviewed.  ?Constitutional:   ?   General: She is not in acute distress. ?   Appearance: Normal appearance. She is well-developed.  ?HENT:  ?   Head: Normocephalic and atraumatic.  ?   Right Ear: Tympanic membrane normal.  ?   Left Ear: Tympanic membrane normal.  ?   Nose: Mucosal edema and rhinorrhea present.  ?   Right Sinus: Maxillary sinus tenderness and frontal sinus tenderness present.  ?   Left Sinus: Maxillary sinus tenderness and frontal sinus tenderness present.  ?   Mouth/Throat:  ?   Pharynx: Uvula midline. Posterior oropharyngeal erythema present. No oropharyngeal exudate.  ?Eyes:  ?   Conjunctiva/sclera: Conjunctivae normal.  ?   Pupils: Pupils are equal, round, and reactive to light.  ?Cardiovascular:  ?   Rate and Rhythm: Normal rate and regular rhythm.  ?   Heart sounds: Normal heart sounds.  ?Pulmonary:  ?   Effort: Pulmonary effort is normal. No respiratory distress.  ?   Breath sounds: Normal breath sounds. No wheezing.  ?Musculoskeletal:  ?   Cervical back: Normal  range of motion and neck supple.  ?Lymphadenopathy:  ?   Cervical: No cervical adenopathy.  ?Skin: ?   General: Skin is warm and dry.  ?   Comments: Wart on palmar surface of L middle finger  ?Neurological:  ?   General: No focal deficit present.  ?   Mental Status: She is alert and oriented to person, place, and time.  ?   Cranial Nerves: No cranial nerve deficit.  ?   Motor: No weakness.  ?   Coordination: Coordination normal.  ?Psychiatric:     ?   Mood and Affect: Mood normal.     ?   Behavior: Behavior normal.     ?   Thought Content: Thought content normal.  ? ? ? ? ? ?   ?Assessment & Plan:  ? ?Bacterial sinusitis- new.  Pt's sxs and PE consistent w/ infxn.  She is traveling this upcoming weekend and would prefer to treat rather than wait.  Prescription for Amoxicillin sent.  Reviewed supportive care and red flags that should prompt return.  Pt expressed understanding and is in agreement w/ plan.  ? ?Wart- new.  Reviewed tx w/ pt and encouraged her that she was doing the right things.  Discussed that she can pare down the excess skin as long as it's not painful so  that the wart strips can work better.  Pt expressed understanding and is in agreement w/ plan.  ?

## 2021-10-27 NOTE — Patient Instructions (Signed)
Follow up as needed or as scheduled ?START the Amoxicillin twice daily- take w/ food ?Drink LOTS of fluids ?REST!! ?TAKE Claritin or Zyrtec daily to improve the allergy component ?Continue the wart removal strips.  You can pare down the dead skin (as long as it's not painful) and the strips will work faster ?Call with any questions or concerns ?Hang in there!!! ?

## 2021-10-30 ENCOUNTER — Ambulatory Visit: Payer: 59 | Admitting: Diagnostic Neuroimaging

## 2021-11-06 ENCOUNTER — Other Ambulatory Visit: Payer: Self-pay

## 2021-11-06 ENCOUNTER — Ambulatory Visit (INDEPENDENT_AMBULATORY_CARE_PROVIDER_SITE_OTHER): Payer: 59 | Admitting: Family Medicine

## 2021-11-06 DIAGNOSIS — E539 Vitamin B deficiency, unspecified: Secondary | ICD-10-CM | POA: Diagnosis not present

## 2021-11-06 DIAGNOSIS — E538 Deficiency of other specified B group vitamins: Secondary | ICD-10-CM

## 2021-11-06 NOTE — Progress Notes (Signed)
Patient is here to receive B12 injection per Dr. Birdie Riddle. ?

## 2021-12-08 ENCOUNTER — Ambulatory Visit (INDEPENDENT_AMBULATORY_CARE_PROVIDER_SITE_OTHER): Payer: 59 | Admitting: Family Medicine

## 2021-12-08 DIAGNOSIS — E538 Deficiency of other specified B group vitamins: Secondary | ICD-10-CM

## 2021-12-08 MED ORDER — CYANOCOBALAMIN 1000 MCG/ML IJ SOLN
1000.0000 ug | Freq: Once | INTRAMUSCULAR | Status: AC
  Administered 2021-12-08: 1000 ug via INTRAMUSCULAR

## 2021-12-08 NOTE — Progress Notes (Signed)
Stacy [name] is a 41 y.o. female presents to the office today for b12 injection, per physician's orders. ? ? ?[name] ? ?

## 2021-12-11 LAB — HM PAP SMEAR: HM Pap smear: NEGATIVE

## 2021-12-11 LAB — HM MAMMOGRAPHY

## 2021-12-15 ENCOUNTER — Other Ambulatory Visit: Payer: Self-pay | Admitting: Obstetrics and Gynecology

## 2021-12-15 DIAGNOSIS — R928 Other abnormal and inconclusive findings on diagnostic imaging of breast: Secondary | ICD-10-CM

## 2021-12-22 ENCOUNTER — Telehealth: Payer: Self-pay

## 2021-12-22 MED ORDER — MOTEGRITY 2 MG PO TABS: 1.0000 | ORAL_TABLET | Freq: Every day | ORAL | 1 refills | Status: DC

## 2021-12-22 NOTE — Telephone Encounter (Signed)
Prescription for Motegrity sent at pt's request ?

## 2021-12-28 ENCOUNTER — Other Ambulatory Visit: Payer: Self-pay

## 2021-12-28 DIAGNOSIS — I951 Orthostatic hypotension: Secondary | ICD-10-CM

## 2021-12-28 MED ORDER — FLUDROCORTISONE ACETATE 0.1 MG PO TABS: 0.1000 mg | ORAL_TABLET | Freq: Every day | ORAL | Status: DC

## 2022-01-05 ENCOUNTER — Ambulatory Visit
Admission: RE | Admit: 2022-01-05 | Discharge: 2022-01-05 | Disposition: A | Discharge status: Home / Self Care | Payer: 59 | Source: Ambulatory Visit | Attending: Obstetrics and Gynecology | Admitting: Obstetrics and Gynecology

## 2022-01-05 ENCOUNTER — Ambulatory Visit: Payer: 59

## 2022-01-05 DIAGNOSIS — R928 Other abnormal and inconclusive findings on diagnostic imaging of breast: Secondary | ICD-10-CM

## 2022-01-08 ENCOUNTER — Ambulatory Visit: Payer: 59

## 2022-01-08 ENCOUNTER — Encounter: Payer: Self-pay | Admitting: Family Medicine

## 2022-01-08 ENCOUNTER — Ambulatory Visit (INDEPENDENT_AMBULATORY_CARE_PROVIDER_SITE_OTHER): Payer: 59 | Admitting: Family Medicine

## 2022-01-08 VITALS — BP 98/60 | HR 89 | Temp 98.1°F | Resp 16 | Ht 63.0 in | Wt 97.8 lb

## 2022-01-08 DIAGNOSIS — E539 Vitamin B deficiency, unspecified: Secondary | ICD-10-CM

## 2022-01-08 DIAGNOSIS — E43 Unspecified severe protein-calorie malnutrition: Secondary | ICD-10-CM | POA: Diagnosis not present

## 2022-01-08 DIAGNOSIS — E559 Vitamin D deficiency, unspecified: Secondary | ICD-10-CM

## 2022-01-08 DIAGNOSIS — Z0000 Encounter for general adult medical examination without abnormal findings: Secondary | ICD-10-CM | POA: Diagnosis not present

## 2022-01-08 LAB — HEPATIC FUNCTION PANEL
ALT: 10 U/L (ref 0–35)
AST: 12 U/L (ref 0–37)
Albumin: 4.2 g/dL (ref 3.5–5.2)
Alkaline Phosphatase: 59 U/L (ref 39–117)
Bilirubin, Direct: 0.2 mg/dL (ref 0.0–0.3)
Total Bilirubin: 0.8 mg/dL (ref 0.2–1.2)
Total Protein: 6.6 g/dL (ref 6.0–8.3)

## 2022-01-08 LAB — CBC WITH DIFFERENTIAL/PLATELET
Basophils Absolute: 0 10*3/uL (ref 0.0–0.1)
Basophils Relative: 0.4 % (ref 0.0–3.0)
Eosinophils Absolute: 0 10*3/uL (ref 0.0–0.7)
Eosinophils Relative: 0.4 % (ref 0.0–5.0)
HCT: 31.2 % — ABNORMAL LOW (ref 36.0–46.0)
Hemoglobin: 10.3 g/dL — ABNORMAL LOW (ref 12.0–15.0)
Lymphocytes Relative: 20.3 % (ref 12.0–46.0)
Lymphs Abs: 1.6 10*3/uL (ref 0.7–4.0)
MCHC: 32.9 g/dL (ref 30.0–36.0)
MCV: 82.7 fl (ref 78.0–100.0)
Monocytes Absolute: 0.7 10*3/uL (ref 0.1–1.0)
Monocytes Relative: 8.7 % (ref 3.0–12.0)
Neutro Abs: 5.4 10*3/uL (ref 1.4–7.7)
Neutrophils Relative %: 70.2 % (ref 43.0–77.0)
Platelets: 381 10*3/uL (ref 150.0–400.0)
RBC: 3.77 Mil/uL — ABNORMAL LOW (ref 3.87–5.11)
RDW: 14.3 % (ref 11.5–15.5)
WBC: 7.7 10*3/uL (ref 4.0–10.5)

## 2022-01-08 LAB — BASIC METABOLIC PANEL
BUN: 18 mg/dL (ref 6–23)
CO2: 34 mEq/L — ABNORMAL HIGH (ref 19–32)
Calcium: 9.2 mg/dL (ref 8.4–10.5)
Chloride: 100 mEq/L (ref 96–112)
Creatinine, Ser: 0.6 mg/dL (ref 0.40–1.20)
GFR: 111.16 mL/min (ref >60.00)
Glucose, Bld: 60 mg/dL — ABNORMAL LOW (ref 70–99)
Potassium: 3.6 mEq/L (ref 3.5–5.1)
Sodium: 141 mEq/L (ref 135–145)

## 2022-01-08 LAB — MAGNESIUM: Magnesium: 2.4 mg/dL (ref 1.5–2.5)

## 2022-01-08 LAB — B12 AND FOLATE PANEL
Folate: 23.9 ng/mL (ref >5.9)
Vitamin B-12: >1504 pg/mL — ABNORMAL HIGH (ref 211–911)

## 2022-01-08 LAB — VITAMIN D 25 HYDROXY (VIT D DEFICIENCY, FRACTURES): VITD: 27.03 ng/mL — ABNORMAL LOW (ref 30.00–100.00)

## 2022-01-08 LAB — TSH: TSH: 0.46 u[IU]/mL (ref 0.35–5.50)

## 2022-01-08 LAB — LIPID PANEL
Cholesterol: 167 mg/dL (ref 0–200)
HDL: 69 mg/dL (ref >39.00)
LDL Cholesterol: 87 mg/dL (ref 0–99)
NonHDL: 97.95
Total CHOL/HDL Ratio: 2
Triglycerides: 56 mg/dL (ref 0.0–149.0)
VLDL: 11.2 mg/dL (ref 0.0–40.0)

## 2022-01-08 MED ORDER — CYANOCOBALAMIN 1000 MCG/ML IJ SOLN
1000.0000 ug | Freq: Once | INTRAMUSCULAR | Status: AC
  Administered 2022-02-04: 1000 ug via INTRAMUSCULAR

## 2022-01-08 NOTE — Progress Notes (Unsigned)
   Subjective:    Patient ID: Stacy [name], female    DOB: [birth year], 41 y.o.   MRN: [id number]  HPI CPE- UTD on pap, Tdap, mammo.  'i'm doing great'.  Pt reports she is eating and drinking regularly.  Trying to gain weight.  Walking regularly.  Patient Care Team    Relationship Specialty Notifications Start End  [name], MD PCP - General Family Medicine  07/17/19   Skeet Latch, MD PCP - Cardiology Cardiology  05/25/20   Wilford Corner, MD Consulting Physician Gastroenterology  06/27/17   Allyn Kenner, [place] Physician Obstetrics and Gynecology  06/27/17   Elige Ko, [place] Physician Gastroenterology  04/12/19   Sherlynn Carbon, MD Referring Physician Gastroenterology  07/17/19     Health Maintenance  Topic Date Due   INFLUENZA VACCINE  03/16/2022   PAP SMEAR-Modifier  12/11/2024   TETANUS/TDAP  06/02/2025   Hepatitis C Screening  Completed   HIV Screening  Completed   HPV VACCINES  Aged Out   COVID-19 Vaccine  Discontinued      Review of Systems Patient reports no vision/ hearing changes, adenopathy,fever, weight change,  persistant/recurrent hoarseness , swallowing issues, chest pain, palpitations, edema, persistant/recurrent cough, hemoptysis, dyspnea (rest/exertional/paroxysmal nocturnal), gastrointestinal bleeding (melena, rectal bleeding), abdominal pain, significant heartburn, bowel changes, GU symptoms (dysuria, hematuria, incontinence), Gyn symptoms (abnormal  bleeding, pain),  syncope, focal weakness, memory loss, numbness & tingling, skin/hair/nail changes, abnormal bruising or bleeding, anxiety, or depression.     Objective:   Physical Exam General Appearance:    Alert, cooperative, no distress, appears stated age, very thin  Head:    Normocephalic, without obvious abnormality, atraumatic  Eyes:    PERRL, conjunctiva/corneas clear, EOM's intact both eyes  Ears:    Normal TM's and external ear canals, both ears   Nose:   Nares normal, septum midline, mucosa normal, no drainage    or sinus tenderness  Throat:   Lips, mucosa, and tongue normal; teeth and gums normal  Neck:   Supple, symmetrical, trachea midline, no adenopathy;    Thyroid: no enlargement/tenderness/nodules  Back:     Symmetric, no curvature, ROM normal, no CVA tenderness  Lungs:     Clear to auscultation bilaterally, respirations unlabored  Chest Wall:    No tenderness or deformity   Heart:    Regular rate and rhythm, S1 and S2 normal, no murmur, rub   or gallop  Breast Exam:    Deferred to GYN  Abdomen:     Soft, non-tender, bowel sounds active all four quadrants,    no masses, no organomegaly  Genitalia:    Deferred to GYN  Rectal:    Extremities:   Extremities normal, atraumatic, no cyanosis or edema  Pulses:   2+ and symmetric all extremities  Skin:   Skin color, texture, turgor normal, no rashes or lesions  Lymph nodes:   Cervical, supraclavicular, and axillary nodes normal  Neurologic:   CNII-XII intact, normal strength, sensation and reflexes    throughout          Assessment & Plan:

## 2022-01-08 NOTE — Patient Instructions (Signed)
Follow up in 1 year or as needed We'll notify you of your lab results and make any changes if needed Keep up the good work on regular eating and weight gain- you're doing great!!! Call with any questions or concerns Stay Safe!  Stay Healthy! Have a great summer!!!

## 2022-01-11 NOTE — Assessment & Plan Note (Signed)
Ongoing issue for pt.  She reports she is trying to gain weight.  BMI currently 17.32  Stressed need for high protein diet and regular eating.  Will continue to follow.

## 2022-01-11 NOTE — Assessment & Plan Note (Signed)
Check labs.  Replete prn. 

## 2022-01-11 NOTE — Assessment & Plan Note (Signed)
PE WNL w/ exception of low BMI.  UTD on pap, mammo, Tdap.  Check labs.  Anticipatory guidance provided.

## 2022-01-13 ENCOUNTER — Other Ambulatory Visit: Payer: Self-pay

## 2022-01-13 DIAGNOSIS — E559 Vitamin D deficiency, unspecified: Secondary | ICD-10-CM

## 2022-01-13 MED ORDER — VITAMIN D (ERGOCALCIFEROL) 1.25 MG (50000 UNIT) PO CAPS: 50000.0000 [IU] | ORAL_CAPSULE | ORAL | 0 refills | Status: DC

## 2022-02-04 ENCOUNTER — Ambulatory Visit (INDEPENDENT_AMBULATORY_CARE_PROVIDER_SITE_OTHER): Payer: 59 | Admitting: Family Medicine

## 2022-02-04 DIAGNOSIS — E539 Vitamin B deficiency, unspecified: Secondary | ICD-10-CM | POA: Diagnosis not present

## 2022-02-04 NOTE — Progress Notes (Signed)
Pt presented today for B12 injection, no concerns with last dose, given in the Lt deltoid without concerns

## 2022-02-08 ENCOUNTER — Encounter: Payer: Self-pay | Admitting: Family Medicine

## 2022-02-09 ENCOUNTER — Other Ambulatory Visit: Payer: Self-pay

## 2022-02-09 ENCOUNTER — Telehealth: Payer: 59 | Admitting: Registered Nurse

## 2022-02-09 DIAGNOSIS — J0110 Acute frontal sinusitis, unspecified: Secondary | ICD-10-CM | POA: Diagnosis not present

## 2022-02-09 MED ORDER — AMOXICILLIN 875 MG PO TABS: 875.0000 mg | ORAL_TABLET | Freq: Two times a day (BID) | ORAL | 0 refills | Status: AC

## 2022-02-11 ENCOUNTER — Telehealth: Payer: Self-pay | Admitting: Family Medicine

## 2022-02-11 MED ORDER — FLUDROCORTISONE ACETATE 0.1 MG PO TABS: 0.1000 mg | ORAL_TABLET | Freq: Two times a day (BID) | ORAL | 2 refills | Status: DC

## 2022-02-11 NOTE — Telephone Encounter (Signed)
Prescription sent to [facility] for pt

## 2022-02-11 NOTE — Telephone Encounter (Signed)
Pt called stating that she need her medication Fludrocortisone 0.1 mg before she leave town tomorrow.

## 2022-03-09 ENCOUNTER — Ambulatory Visit (INDEPENDENT_AMBULATORY_CARE_PROVIDER_SITE_OTHER): Payer: 59 | Admitting: Family Medicine

## 2022-03-09 ENCOUNTER — Ambulatory Visit: Payer: 59

## 2022-03-09 DIAGNOSIS — E539 Vitamin B deficiency, unspecified: Secondary | ICD-10-CM

## 2022-03-09 MED ORDER — CYANOCOBALAMIN 1000 MCG/ML IJ SOLN
1000.0000 ug | Freq: Once | INTRAMUSCULAR | Status: AC
  Administered 2022-03-09: 1000 ug via INTRAMUSCULAR

## 2022-03-09 NOTE — Progress Notes (Signed)
Pt received her B12 injection today. Given in left deltoid

## 2022-03-12 ENCOUNTER — Telehealth: Payer: Self-pay | Admitting: Family Medicine

## 2022-03-12 ENCOUNTER — Other Ambulatory Visit: Payer: Self-pay

## 2022-03-12 MED ORDER — MONTELUKAST SODIUM 10 MG PO TABS: 10.0000 mg | ORAL_TABLET | Freq: Every day | ORAL | 3 refills | Status: DC

## 2022-03-12 NOTE — Telephone Encounter (Signed)
Refill sent to pharmacy.   

## 2022-03-12 NOTE — Telephone Encounter (Signed)
Encourage patient to contact the pharmacy for refills or they can request refills through [facility]  (Please schedule appointment if patient has not been seen in over a year)    [facility] THIS SENT TO: [place], [place] - [address]  MEDICATION NAME & DOSE:Montelukast 10 mg  NOTES/COMMENTS FROM PATIENT: Pt states that she is completely out of medication.      [place] office please notify patient: It takes 48-72 hours to process rx refill requests Ask patient to call pharmacy to ensure rx is ready before heading there.

## 2022-04-06 ENCOUNTER — Other Ambulatory Visit: Payer: Self-pay

## 2022-04-06 DIAGNOSIS — E559 Vitamin D deficiency, unspecified: Secondary | ICD-10-CM

## 2022-04-06 MED ORDER — VITAMIN D (ERGOCALCIFEROL) 1.25 MG (50000 UNIT) PO CAPS: 50000.0000 [IU] | ORAL_CAPSULE | ORAL | 3 refills | Status: DC

## 2022-04-08 ENCOUNTER — Other Ambulatory Visit: Payer: Self-pay

## 2022-04-08 MED ORDER — FLUDROCORTISONE ACETATE 0.1 MG PO TABS: 0.1000 mg | ORAL_TABLET | Freq: Two times a day (BID) | ORAL | 2 refills | Status: DC

## 2022-04-13 ENCOUNTER — Ambulatory Visit (INDEPENDENT_AMBULATORY_CARE_PROVIDER_SITE_OTHER): Payer: 59 | Admitting: Family Medicine

## 2022-04-13 DIAGNOSIS — E539 Vitamin B deficiency, unspecified: Secondary | ICD-10-CM | POA: Diagnosis not present

## 2022-04-14 DIAGNOSIS — E539 Vitamin B deficiency, unspecified: Secondary | ICD-10-CM | POA: Diagnosis not present

## 2022-04-14 MED ORDER — CYANOCOBALAMIN 1000 MCG/ML IJ SOLN
1000.0000 ug | Freq: Once | INTRAMUSCULAR | Status: AC
  Administered 2022-04-14: 1000 ug via INTRAMUSCULAR

## 2022-04-15 NOTE — Progress Notes (Signed)
Pt for B12 injxn.  Tolerated w/o difficulty

## 2022-05-14 ENCOUNTER — Other Ambulatory Visit: Payer: Self-pay

## 2022-05-14 ENCOUNTER — Ambulatory Visit (INDEPENDENT_AMBULATORY_CARE_PROVIDER_SITE_OTHER): Payer: 59 | Admitting: Family Medicine

## 2022-05-14 ENCOUNTER — Telehealth: Payer: Self-pay

## 2022-05-14 DIAGNOSIS — E539 Vitamin B deficiency, unspecified: Secondary | ICD-10-CM | POA: Diagnosis not present

## 2022-05-14 MED ORDER — CYANOCOBALAMIN 1000 MCG/ML IJ SOLN
1000.0000 ug | Freq: Once | INTRAMUSCULAR | Status: AC
  Administered 2023-05-24: 1000 ug via INTRAMUSCULAR

## 2022-05-14 NOTE — Telephone Encounter (Signed)
We can stop her B12 shots and see how things go over the next few months.  I would want to recheck a B12 level at a lab only visit in January to see if the level has dropped or is remaining stable (dx- B12 deficiency)

## 2022-05-14 NOTE — Progress Notes (Signed)
Pt came in for her B12 injection . Gave in right deltoid . Pt tolerated injection well

## 2022-05-14 NOTE — Telephone Encounter (Signed)
Spoke w/ pt and explained she can stop the injections and we will recheck her B12 at a lab only visit in Jan . We have scheduled that apt and B12 lab order is in place

## 2022-05-19 ENCOUNTER — Other Ambulatory Visit: Payer: 59

## 2022-05-19 ENCOUNTER — Ambulatory Visit (INDEPENDENT_AMBULATORY_CARE_PROVIDER_SITE_OTHER): Payer: 59 | Admitting: Family Medicine

## 2022-05-19 DIAGNOSIS — Z23 Encounter for immunization: Secondary | ICD-10-CM | POA: Diagnosis not present

## 2022-05-19 NOTE — Progress Notes (Signed)
Pt came in for her flu vaccine tolerated well

## 2022-07-07 ENCOUNTER — Other Ambulatory Visit: Payer: Self-pay

## 2022-07-07 MED ORDER — MOTEGRITY 2 MG PO TABS: 1.0000 | ORAL_TABLET | Freq: Every day | ORAL | 1 refills | Status: DC

## 2022-07-10 ENCOUNTER — Other Ambulatory Visit: Payer: Self-pay | Admitting: Family Medicine

## 2022-07-10 ENCOUNTER — Other Ambulatory Visit (HOSPITAL_BASED_OUTPATIENT_CLINIC_OR_DEPARTMENT_OTHER): Payer: Self-pay | Admitting: Cardiovascular Disease

## 2022-07-10 DIAGNOSIS — E861 Hypovolemia: Secondary | ICD-10-CM

## 2022-07-12 ENCOUNTER — Other Ambulatory Visit: Payer: Self-pay

## 2022-07-12 MED ORDER — FLUDROCORTISONE ACETATE 0.1 MG PO TABS: 0.1000 mg | ORAL_TABLET | Freq: Two times a day (BID) | ORAL | 2 refills | Status: DC

## 2022-07-12 NOTE — Telephone Encounter (Signed)
Rx(s) sent to pharmacy electronically.  

## 2022-07-15 ENCOUNTER — Telehealth: Payer: 59 | Admitting: Family Medicine

## 2022-07-15 ENCOUNTER — Encounter: Payer: Self-pay | Admitting: Family Medicine

## 2022-07-15 DIAGNOSIS — J111 Influenza due to unidentified influenza virus with other respiratory manifestations: Secondary | ICD-10-CM

## 2022-07-15 MED ORDER — OSELTAMIVIR PHOSPHATE 75 MG PO CAPS: 75.0000 mg | ORAL_CAPSULE | Freq: Two times a day (BID) | ORAL | 0 refills | Status: DC

## 2022-07-15 MED ORDER — GUAIFENESIN-CODEINE 100-10 MG/5ML PO SYRP: 10.0000 mL | ORAL_SOLUTION | Freq: Three times a day (TID) | ORAL | 0 refills | Status: DC | PRN

## 2022-07-15 NOTE — Progress Notes (Signed)
Virtual Visit via Video   I connected with patient on 07/15/22 at  9:40 AM EST by a video enabled telemedicine application and verified that I am speaking with the correct person using two identifiers.  Location patient: Home Location provider: [facility], Office Persons participating in the virtual visit: Patient, Provider, [place] [name])  I discussed the limitations of evaluation and management by telemedicine and the availability of in person appointments. The patient expressed understanding and agreed to proceed.  Subjective:   HPI:   Fever- pt started vomiting Monday night.  Vomited Tuesday.  Developed fever to 101 Tuesday night.  Now w/ body aches, chills, cough, HA.  No sore throat.  Skin is sensitive to touch.  Pt has tested negative for COVID x2.    ROS:   See pertinent positives and negatives per HPI.  Patient Active Problem List   Diagnosis Date Noted   Adie's tonic pupil, 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   Anxiety 09/25/2020   Chronic idiopathic constipation 09/25/2020   Lesion of liver 09/25/2020   Weight loss 09/25/2020   Adrenal insufficiency ([place]) 09/16/2020   Amenorrhea 07/21/2020   Orthostatic hypotension    Hypophosphatemia    Volume depletion, gastrointestinal loss    Hypomagnesemia    Bloating    Nausea and vomiting 04/28/2020   Failure to thrive in adult 04/27/2020   Physical exam 01/01/2020   Generalized abdominal pain 04/25/2019   Hyponatremia 04/25/2019   Hypokalemia 04/25/2019   Severe protein-calorie malnutrition ([place]) 04/25/2019   Gastroesophageal reflux disease 04/07/2019   Normocytic anemia 04/07/2019   Chronic intestinal pseudo-obstruction 09/05/2018   Iron deficiency anemia 01/22/2018   Vitamin B deficiency 01/22/2018   Vitamin D deficiency 01/22/2018   Underweight 06/27/2017   Cyst of ovary 10/05/2016   Hepatic adenoma 07/27/2013    Social History   Tobacco Use   Smoking status: Never   Smokeless tobacco: Never  Substance Use  Topics   Alcohol use: No    Comment: socially     Current Outpatient Medications:    acetaminophen (TYLENOL) 500 MG tablet, Take 500 mg by mouth every 6 (six) hours as needed for headache (pain)., Disp: , Rfl:    cholecalciferol (VITAMIN D) 25 MCG (1000 UNIT) tablet, TAKE 2 TABLETS (2,000 UNITS TOTAL) BY MOUTH DAILY., Disp: 180 tablet, Rfl: 1   cyanocobalamin (,VITAMIN B-12,) 1000 MCG/ML injection, Inject 1,000 mcg into the muscle every 30 (thirty) days., Disp: , Rfl:    famotidine (PEPCID) 20 MG tablet, TAKE 1 TABLET BY MOUTH EVERYDAY AT BEDTIME, Disp: 90 tablet, Rfl: 2   fludrocortisone (FLORINEF) 0.1 MG tablet, Take 1 tablet (0.1 mg total) by mouth 2 (two) times daily., Disp: 60 tablet, Rfl: 2   midodrine (PROAMATINE) 5 MG tablet, Take 1 tablet (5 mg total) by mouth 3 (three) times daily with meals. NEED APPOINTMENT, Disp: 30 tablet, Rfl: 0   mirtazapine (REMERON) 15 MG tablet, TAKE 1 TABLET BY MOUTH EVERYDAY AT BEDTIME, Disp: 90 tablet, Rfl: 1   montelukast (SINGULAIR) 10 MG tablet, TAKE 1 TABLET(10 MG) BY MOUTH DAILY, Disp: 30 tablet, Rfl: 3   montelukast (SINGULAIR) 10 MG tablet, TAKE 1 TABLET(10 MG) BY MOUTH DAILY, Disp: 30 tablet, Rfl: 3   Multiple Vitamin (MULTIVITAMIN WITH MINERALS) TABS tablet, Take 1 tablet by mouth daily., Disp: , Rfl:    polyethylene glycol (MIRALAX / GLYCOLAX) 17 g packet, Take 17 g by mouth 3 (three) times daily., Disp: 14 each, Rfl: 0   Prucalopride Succinate (MOTEGRITY) 2 MG  TABS, Take 1 tablet (2 mg total) by mouth daily., Disp: 90 tablet, Rfl: 1   sucralfate (CARAFATE) 1 g tablet, Take 1 g by mouth 4 (four) times daily., Disp: , Rfl:    Vitamin D, Ergocalciferol, (DRISDOL) 1.25 MG (50000 UNIT) CAPS capsule, Take 1 capsule (50,000 Units total) by mouth every 7 (seven) days., Disp: 12 capsule, Rfl: 3   RABEprazole (ACIPHEX) 20 MG tablet, Take by mouth., Disp: , Rfl:   Current Facility-Administered Medications:    cyanocobalamin ((VITAMIN B-12)) injection  1,000 mcg, 1,000 mcg, Intramuscular, Q30 days, [name], MD, 1,000 mcg at 11/06/21 4128   cyanocobalamin ((VITAMIN B-12)) injection 1,000 mcg, 1,000 mcg, Intramuscular, Once, [name], MD   cyanocobalamin (VITAMIN B12) injection 1,000 mcg, 1,000 mcg, Intramuscular, Once, [name], MD  Allergies  Allergen Reactions   Tetracyclines & Related Swelling    Swelling in spine; required spinal tap.    Erythromycin Other (See Comments)    Swelling in spine; required spinal tap   Raspberry Rash   Sulfa Antibiotics Rash    hives   Sulfonamide Derivatives Rash    Reaction to cream    Objective:   There were no vitals taken for this visit. AAOx3, ill appearing NCAT, EOMI No obvious CN deficits Pale  Pt is able to speak clearly, coherently without shortness of breath or increased work of breathing.  Thought process is linear.  Mood is appropriate.   Assessment and Plan:   Influenza- new.  Unable to test pt via video but given her fever, vomiting, body aches, skin sensitivity, cough and negative COVID x2 this is highly suspicious for influenza and consistent w/ what we have been seeing recently in the community.  Start Tamiflu.  She is to take her nausea medication as needed.  Will provide codeine cough syrup to use prn.  Reviewed supportive care and red flags that should prompt return.  Pt expressed understanding and is in agreement w/ plan.    [name], MD 07/15/2022

## 2022-07-28 ENCOUNTER — Encounter: Payer: Self-pay | Admitting: Family Medicine

## 2022-07-28 ENCOUNTER — Ambulatory Visit (INDEPENDENT_AMBULATORY_CARE_PROVIDER_SITE_OTHER): Payer: Self-pay | Admitting: Family Medicine

## 2022-07-28 VITALS — BP 100/68 | HR 70 | Temp 100.0°F | Resp 16 | Ht 63.0 in | Wt 97.0 lb

## 2022-07-28 DIAGNOSIS — J329 Chronic sinusitis, unspecified: Secondary | ICD-10-CM

## 2022-07-28 DIAGNOSIS — B9689 Other specified bacterial agents as the cause of diseases classified elsewhere: Secondary | ICD-10-CM

## 2022-07-28 MED ORDER — AMOXICILLIN 875 MG PO TABS: 875.0000 mg | ORAL_TABLET | Freq: Two times a day (BID) | ORAL | 0 refills | Status: AC

## 2022-07-28 NOTE — Progress Notes (Signed)
   Subjective:    Patient ID: Stacy [name], female    DOB: 22-Jun-1981, 41 y.o.   MRN: [id number]  HPI Sinusitis- husband had COVID, she had flu, kids had RSV.  Pt reports cough never improved.  Ears have been 'popping'.  + runny nose.  Maxillary sinus pressure, HA.  'i feel horrible'.  Coughing at night.  + upper tooth pain.   Review of Systems For ROS see HPI     Objective:   Physical Exam Vitals reviewed.  Constitutional:      General: She is not in acute distress.    Appearance: Normal appearance. She is well-developed.  HENT:     Head: Normocephalic and atraumatic.     Right Ear: Tympanic membrane normal.     Left Ear: Tympanic membrane normal.     Nose: Mucosal edema and rhinorrhea present.     Right Sinus: Maxillary sinus tenderness and frontal sinus tenderness present.     Left Sinus: Maxillary sinus tenderness and frontal sinus tenderness present.     Mouth/Throat:     Pharynx: Uvula midline. Posterior oropharyngeal erythema present. No oropharyngeal exudate.  Eyes:     Conjunctiva/sclera: Conjunctivae normal.     Pupils: Pupils are equal, round, and reactive to light.  Cardiovascular:     Rate and Rhythm: Normal rate and regular rhythm.     Heart sounds: Normal heart sounds.  Pulmonary:     Effort: Pulmonary effort is normal. No respiratory distress.     Breath sounds: Normal breath sounds. No wheezing.  Musculoskeletal:     Cervical back: Normal range of motion and neck supple.  Lymphadenopathy:     Cervical: No cervical adenopathy.  Skin:    General: Skin is warm and dry.  Neurological:     General: No focal deficit present.     Mental Status: She is alert and oriented to person, place, and time.     Cranial Nerves: No cranial nerve deficit.     Motor: No weakness.     Coordination: Coordination normal.  Psychiatric:        Mood and Affect: Mood normal.        Behavior: Behavior normal.        Thought Content: Thought content normal.            Assessment & Plan:  Bacterial sinusitis- new.  Pt was recently sick w/ the flu and now has sinus pain, HA, tooth pain.  This is consistent w/ secondary sinusitis.  Start Amoxicillin '875mg'$  BID.  Reviewed supportive care and red flags that should prompt return.  Pt expressed understanding and is in agreement w/ plan.

## 2022-07-28 NOTE — Patient Instructions (Signed)
Follow up as needed or as scheduled START the Amoxicillin twice daily- take w/ food- for the sinus infection Drink LOTS of fluids REST! Robitussin or Delsym or Mucinex DM for cough/congestion Call with any questions or concerns Hang in there! Happy Holidays!!!

## 2022-08-18 ENCOUNTER — Other Ambulatory Visit: Payer: 59

## 2022-08-19 ENCOUNTER — Telehealth: Payer: 59 | Admitting: Family Medicine

## 2022-08-26 ENCOUNTER — Ambulatory Visit (HOSPITAL_BASED_OUTPATIENT_CLINIC_OR_DEPARTMENT_OTHER): Payer: 59 | Admitting: Cardiovascular Disease

## 2022-08-26 ENCOUNTER — Encounter (HOSPITAL_BASED_OUTPATIENT_CLINIC_OR_DEPARTMENT_OTHER): Payer: Self-pay | Admitting: Cardiovascular Disease

## 2022-08-26 VITALS — BP 118/70 | HR 84 | Ht 63.0 in | Wt 99.1 lb

## 2022-08-26 DIAGNOSIS — I9589 Other hypotension: Secondary | ICD-10-CM

## 2022-08-26 DIAGNOSIS — E861 Hypovolemia: Secondary | ICD-10-CM | POA: Diagnosis not present

## 2022-08-26 DIAGNOSIS — I951 Orthostatic hypotension: Secondary | ICD-10-CM | POA: Diagnosis not present

## 2022-08-26 MED ORDER — FLUDROCORTISONE ACETATE 0.1 MG PO TABS: 0.1000 mg | ORAL_TABLET | Freq: Two times a day (BID) | ORAL | 2 refills | Status: DC

## 2022-08-26 MED ORDER — MIDODRINE HCL 5 MG PO TABS: 5.0000 mg | ORAL_TABLET | Freq: Three times a day (TID) | ORAL | 3 refills | Status: DC

## 2022-08-26 NOTE — Assessment & Plan Note (Signed)
Symptoms have been very well-controlled on midodrine and Florinef.  She did have some mild orthostasis while she had the flu.  This is since resolved.  She tried cutting her midodrine back to twice daily and got lightheaded when going up and down the steps.  Will continue with her current doses of both medications.  She has not had any issues with swelling.  Electrolytes have been stable.  Since her GERD has been treated she has done much better.

## 2022-08-26 NOTE — Patient Instructions (Signed)
Medication Instructions:  Refills sent to pharmacy for Midodrine and Florinef *If you need a refill on your cardiac medications before your next appointment, please call your pharmacy*   Lab Work: None  If you have labs (blood work) drawn today and your tests are completely normal, you will receive your results only by: [place] (if you have MyChart) OR A paper copy in the mail If you have any lab test that is abnormal or we need to change your treatment, we will call you to review the results.   Testing/Procedures: None   Follow-Up: At [facility], you and your health needs are our priority.  As part of our continuing mission to provide you with exceptional heart care, we have created designated Provider Care Teams.  These Care Teams include your primary Cardiologist (physician) and Advanced Practice Providers (APPs -  Physician Assistants and Nurse Practitioners) who all work together to provide you with the care you need, when you need it.  We recommend signing up for the patient portal called "MyChart".  Sign up information is provided on this After Visit Summary.  MyChart is used to connect with patients for Virtual Visits (Telemedicine).  Patients are able to view lab/test results, encounter notes, upcoming appointments, etc.  Non-urgent messages can be sent to your provider as well.   To learn more about what you can do with MyChart, go to [email or web address].    Your next appointment:   1 year(s)  Provider:   Skeet Latch, MD    Other Instructions None

## 2022-08-26 NOTE — Progress Notes (Signed)
Cardiology Office Note   Date:  08/26/2022   ID:  Stacy [name], DOB 1980/11/17, MRN [id number]  PCP:  Stacy Minium, MD  Cardiologist:   Stacy Latch, MD   No chief complaint on file.   History of Present Illness: Stacy [name] is a 42 y.o. female  anxiety, unintentional weight loss, failure to thrive, GERD, and chronic intestinal pseudo-obstruction who presents for follow up.  She was seen in the hospital 05/2020 with profound orthostasis.  Ms. Mclees reports 7 years of unintentional weight loss.  Has had episodes of nausea and vomiting and inability to eat.  She continues to lose weight.  She was she was hospitalized for intestinal pseudo-obstruction.  This hospitalization she has been profoundly hypotensive and orthostatic, with the lowest recorded blood pressure 56/44.  She did receive several liters of IV fluids upon admission.  She has not received any in the last several days.  Her hospitalist team started her on midodrine and increase the dose to 10 mg 3 times daily.  She is also on fludrocortisone.  An echocardiogram that admission revealed LVEF 50 to 55% with no other significant abnormalities.  Her labs have been notable for mild anemia.  Hemoglobin slowly downtrending to 9.2.  Creatinine is low at 0.34.  Albumin is 2.7.  This was an improvement from 1.4 on admission.  Thyroid was normal 12/2019.  They plan to check for cortisol levels but were unable given her recent steroid use.  Cardiology was consulted for persistent orthostatic hypotension despite TED hose and fluid repletion.    She is working with a GI at [facility] and her PPI was changed.  She was able to eat regularly and had no more lightheadedness or dizziness. She was encouraged to keep adding salt to her foods, and was maintained on florinef and midodrine. She saw endocrinology and her cortisol stim test was normal.  At the last visit midodrine was reduced to 5 mg.   Today, she is feeling overall well. She  reports that she recently had the flu twice, she tested positive for the flu on New Years Eve. She continues to have a lingering cough and has an appointment scheduled with her doctor on Monday.   She has been managing her blood pressure well at home. However, while she had the flu, her blood pressure dropped and was as low as 86/54 and was resolved after eating lunch. She previously took her blood pressure medication twice a day but stopped because it was making her lightheaded when walking up the stairs. She is currently compliant with florinef (twice a day) and midodrine (3 times a day).  She stays active mainly by walking. She struggles to do so during the winter, but when the weather is warm she is more consistent. She sometimes feels as though her blood sugar levels are low when walking, but overall she feels fine when walking.   She denies any palpitations, chest pain, shortness of breath, or peripheral edema. No headaches, syncope, orthopnea, or PND.   Past Medical History:  Diagnosis Date   Anxiety    Benign liver cyst    Chronic intestinal pseudo-obstruction    GERD (gastroesophageal reflux disease)    History of UTI     Past Surgical History:  Procedure Laterality Date   BIOPSY  05/20/2020   Procedure: BIOPSY;  Surgeon: Wonda Horner, MD;  Location: [facility];  Service: Endoscopy;;   CESAREAN SECTION     twice 2010, 2013  ESOPHAGOGASTRODUODENOSCOPY (EGD) WITH PROPOFOL N/A 05/20/2020   Procedure: ESOPHAGOGASTRODUODENOSCOPY (EGD) WITH PROPOFOL;  Surgeon: Wonda Horner, MD;  Location: [facility];  Service: Endoscopy;  Laterality: N/A;   Stacy [name]  ~2015     Current Outpatient Medications  Medication Sig Dispense Refill   acetaminophen (TYLENOL) 500 MG tablet Take 500 mg by mouth every 6 (six) hours as needed for headache (pain).     cholecalciferol (VITAMIN D) 25 MCG (1000 UNIT) tablet TAKE 2 TABLETS (2,000 UNITS TOTAL) BY MOUTH DAILY. 180 tablet 1   cyanocobalamin  (,VITAMIN B-12,) 1000 MCG/ML injection Inject 1,000 mcg into the muscle every 30 (thirty) days.     famotidine (PEPCID) 20 MG tablet TAKE 1 TABLET BY MOUTH EVERYDAY AT BEDTIME 90 tablet 2   guaiFENesin-codeine (ROBITUSSIN AC) 100-10 MG/5ML syrup Take 10 mLs by mouth 3 (three) times daily as needed for cough. 120 mL 0   mirtazapine (REMERON) 15 MG tablet TAKE 1 TABLET BY MOUTH EVERYDAY AT BEDTIME 90 tablet 1   montelukast (SINGULAIR) 10 MG tablet TAKE 1 TABLET(10 MG) BY MOUTH DAILY 30 tablet 3   montelukast (SINGULAIR) 10 MG tablet TAKE 1 TABLET(10 MG) BY MOUTH DAILY 30 tablet 3   Multiple Vitamin (MULTIVITAMIN WITH MINERALS) TABS tablet Take 1 tablet by mouth daily.     oseltamivir (TAMIFLU) 75 MG capsule Take 1 capsule (75 mg total) by mouth 2 (two) times daily. 10 capsule 0   polyethylene glycol (MIRALAX / GLYCOLAX) 17 g packet Take 17 g by mouth 3 (three) times daily. 14 each 0   Prucalopride Succinate (MOTEGRITY) 2 MG TABS Take 1 tablet (2 mg total) by mouth daily. 90 tablet 1   RABEprazole (ACIPHEX) 20 MG tablet Take by mouth.     sucralfate (CARAFATE) 1 g tablet Take 1 g by mouth 4 (four) times daily.     Vitamin D, Ergocalciferol, (DRISDOL) 1.25 MG (50000 UNIT) CAPS capsule Take 1 capsule (50,000 Units total) by mouth every 7 (seven) days. 12 capsule 3   fludrocortisone (FLORINEF) 0.1 MG tablet Take 1 tablet (0.1 mg total) by mouth 2 (two) times daily. 60 tablet 2   midodrine (PROAMATINE) 5 MG tablet Take 1 tablet (5 mg total) by mouth 3 (three) times daily with meals. 90 tablet 3   Current Facility-Administered Medications  Medication Dose Route Frequency Provider Last Rate Last Admin   cyanocobalamin ((VITAMIN B-12)) injection 1,000 mcg  1,000 mcg Intramuscular Q30 days Stacy Minium, MD   1,000 mcg at 11/06/21 4081   cyanocobalamin ((VITAMIN B-12)) injection 1,000 mcg  1,000 mcg Intramuscular Once Stacy Minium, MD       cyanocobalamin (VITAMIN B12) injection 1,000 mcg   1,000 mcg Intramuscular Once Stacy Minium, MD        Allergies:   Tetracyclines & related, Erythromycin, Raspberry, Sulfa antibiotics, and Sulfonamide derivatives    Social History:  The patient  reports that she has never smoked. She has never used smokeless tobacco. She reports that she does not drink alcohol and does not use drugs.   Family History:  The patient's family history includes CAD in her father; Diabetes in her brother and father; Hypertension in her father.    ROS:   Please see the history of present illness. (+) Lightheadedness (blood pressure medication twice a day) All other systems are reviewed and negative.    PHYSICAL EXAM: VS:  BP 118/70 (BP Location: Right Arm, Patient Position: Sitting, Cuff Size: Normal)   Pulse 84  Ht '5\' 3"'$  (1.6 m)   Wt 99 lb 1.6 oz (45 kg)   BMI 17.55 kg/m  , BMI Body mass index is 17.55 kg/m. GENERAL:  Well appearing HEENT:  Pupils equal round and reactive, fundi not visualized, oral mucosa unremarkable NECK:  No jugular venous distention, waveform within normal limits, carotid upstroke brisk and symmetric, no bruits LUNGS:  Clear to auscultation bilaterally HEART:  RRR.  PMI not displaced or sustained,S1 and S2 within normal limits, no S3, no S4, no clicks, no rubs, no murmurs ABD:  Flat, positive bowel sounds normal in frequency in pitch, no bruits, no rebound, no guarding, no midline pulsatile mass, no hepatomegaly, no splenomegaly EXT:  2 plus pulses throughout, no edema, no cyanosis no clubbing SKIN:  No rashes no nodules NEURO:  Cranial nerves II through XII grossly intact, motor grossly intact throughout PSYCH:  Cognitively intact, oriented to person place and time   EKG:  EKG is personally reviewed. 08/26/2022: Sinus rhythm. Rate 84 bpm. 06/23/2021: Sinus rhythm. Rate 74 bpm. 09/08/2020: Sinus rhythm.  Rate 86 bpm.    Echo 05/24/20:  1. Grossly, no wall motiion abnormalities noted. Left ventricular  ejection  fraction, by estimation, is 50 to 55%. The left ventricle has low  normal function. Left ventricular endocardial border not optimally defined  to evaluate regional wall motion.  Left ventricular diastolic parameters were normal.   2. Right ventricular systolic function is normal. The right ventricular  size is normal.   3. The mitral valve is grossly normal. No evidence of mitral valve  regurgitation.   4. The aortic valve was not well visualized. Aortic valve regurgitation  is trivial.   5. The inferior vena cava is normal in size with greater than 50%  respiratory variability, suggesting right atrial pressure of 3 mmHg.   Recent Labs: 01/08/2022: ALT 10; BUN 18; Creatinine, Ser 0.60; Hemoglobin 10.3; Magnesium 2.4; Platelets 381.0; Potassium 3.6; Sodium 141; TSH 0.46    Lipid Panel    Component Value Date/Time   CHOL 167 01/08/2022 0937   TRIG 56.0 01/08/2022 0937   HDL 69.00 01/08/2022 0937   CHOLHDL 2 01/08/2022 0937   VLDL 11.2 01/08/2022 0937   LDLCALC 87 01/08/2022 0937      Wt Readings from Last 3 Encounters:  08/26/22 99 lb 1.6 oz (45 kg)  07/28/22 97 lb (44 kg)  01/08/22 97 lb 12.8 oz (44.4 kg)      ASSESSMENT AND PLAN:  Orthostatic hypotension Symptoms have been very well-controlled on midodrine and Florinef.  She did have some mild orthostasis while she had the flu.  This is since resolved.  She tried cutting her midodrine back to twice daily and got lightheaded when going up and down the steps.  Will continue with her current doses of both medications.  She has not had any issues with swelling.  Electrolytes have been stable.  Since her GERD has been treated she has done much better.   Current medicines are reviewed at length with the patient today.  The patient does not have concerns regarding medicines.  The following changes have been made:  reduce midodrine to '5mg'$  tid  Labs/ tests ordered today include:   Orders Placed This Encounter  Procedures   EKG  12-Lead    Disposition:   FU with Odester Nilson C. Oval Linsey, MD, [facility] in 1 year.    I,Rachel Rivera,acting as a scribe for Stacy Latch, MD.,have documented all relevant documentation on the behalf of Stacy Latch, MD,as  directed by  Stacy Latch, MD while in the presence of Stacy Latch, MD.  I, [place] Oval Linsey, MD have reviewed all documentation for this visit.  The documentation of the exam, diagnosis, procedures, and orders on 08/26/2022 are all accurate and complete.    Signed, Latonja Bobeck C. Oval Linsey, MD, [facility]  08/26/2022 10:28 AM    [place]

## 2022-08-27 ENCOUNTER — Other Ambulatory Visit (INDEPENDENT_AMBULATORY_CARE_PROVIDER_SITE_OTHER): Payer: 59

## 2022-08-27 DIAGNOSIS — E539 Vitamin B deficiency, unspecified: Secondary | ICD-10-CM

## 2022-08-27 LAB — B12 AND FOLATE PANEL
Folate: >23.8 ng/mL (ref >5.9)
Vitamin B-12: 310 pg/mL (ref 211–911)

## 2022-08-30 ENCOUNTER — Encounter: Payer: Self-pay | Admitting: Family Medicine

## 2022-08-30 ENCOUNTER — Ambulatory Visit (INDEPENDENT_AMBULATORY_CARE_PROVIDER_SITE_OTHER): Payer: Self-pay | Admitting: Family Medicine

## 2022-08-30 VITALS — BP 102/68 | HR 89 | Temp 98.9°F | Resp 18 | Ht 63.0 in | Wt 96.2 lb

## 2022-08-30 DIAGNOSIS — R058 Other specified cough: Secondary | ICD-10-CM

## 2022-08-30 DIAGNOSIS — E539 Vitamin B deficiency, unspecified: Secondary | ICD-10-CM

## 2022-08-30 MED ORDER — PREDNISONE 10 MG PO TABS: ORAL_TABLET | ORAL | 0 refills | Status: DC

## 2022-08-30 MED ORDER — ALBUTEROL SULFATE HFA 108 (90 BASE) MCG/ACT IN AERS: 2.0000 | INHALATION_SPRAY | Freq: Four times a day (QID) | RESPIRATORY_TRACT | 0 refills | Status: DC | PRN

## 2022-08-30 MED ORDER — CYANOCOBALAMIN 1000 MCG/ML IJ SOLN
1000.0000 ug | Freq: Once | INTRAMUSCULAR | Status: AC
  Administered 2022-08-30: 1000 ug via INTRAMUSCULAR

## 2022-08-30 NOTE — Patient Instructions (Signed)
Follow up as needed or as scheduled START the Prednisone as directed- take w/ food USE the Albuterol as needed for coughing fits- 2 puffs as needed Continue to drink LOTS of fluids Call with any questions or concerns Stay Safe!  Stay Healthy! Hang in there!!!

## 2022-08-30 NOTE — Progress Notes (Signed)
   Subjective:    Patient ID: Stacy [name], female    DOB: April 26, 1981, 42 y.o.   MRN: [id number]  HPI Cough- 'my cough is still not gone'.  Cough is wet but not productive.  'it's embarrassing and annoying'.  Pt reports she will have coughing fits.  Has used Robitussin w/ honey w/o relief.  B12 deficiency- here for injxn   Review of Systems For ROS see HPI     Objective:   Physical Exam Vitals reviewed.  Constitutional:      General: She is not in acute distress.    Appearance: Normal appearance. She is well-developed. She is not ill-appearing.  HENT:     Head: Normocephalic and atraumatic.     Nose: Congestion (mild) present. No rhinorrhea.     Mouth/Throat:     Mouth: Mucous membranes are moist.     Pharynx: No oropharyngeal exudate or posterior oropharyngeal erythema.  Eyes:     Conjunctiva/sclera: Conjunctivae normal.     Pupils: Pupils are equal, round, and reactive to light.  Cardiovascular:     Rate and Rhythm: Normal rate and regular rhythm.     Heart sounds: Normal heart sounds. No murmur heard. Pulmonary:     Effort: Pulmonary effort is normal. No respiratory distress.     Breath sounds: Normal breath sounds. No wheezing.     Comments: + hacking cough Musculoskeletal:     Cervical back: Normal range of motion and neck supple.  Lymphadenopathy:     Cervical: No cervical adenopathy.  Skin:    General: Skin is warm and dry.  Neurological:     General: No focal deficit present.     Mental Status: She is alert and oriented to person, place, and time.  Psychiatric:        Mood and Affect: Mood normal.        Behavior: Behavior normal.        Thought Content: Thought content normal.           Assessment & Plan:  Post-viral cough- new.  Pt feels well w/ exception of ongoing cough.  Discussed residual airway inflammation.  Will start albuterol prn and prednisone taper to decrease airway inflammation.  Reviewed supportive care and red flags that should  prompt return.  Pt expressed understanding and is in agreement w/ plan.

## 2022-09-23 ENCOUNTER — Other Ambulatory Visit: Payer: Self-pay | Admitting: Family Medicine

## 2022-09-26 ENCOUNTER — Other Ambulatory Visit: Payer: Self-pay | Admitting: Family Medicine

## 2022-09-30 ENCOUNTER — Other Ambulatory Visit: Payer: Self-pay

## 2022-09-30 ENCOUNTER — Ambulatory Visit (INDEPENDENT_AMBULATORY_CARE_PROVIDER_SITE_OTHER): Payer: 59

## 2022-09-30 ENCOUNTER — Telehealth: Payer: Self-pay | Admitting: Family Medicine

## 2022-09-30 DIAGNOSIS — E539 Vitamin B deficiency, unspecified: Secondary | ICD-10-CM | POA: Diagnosis not present

## 2022-09-30 MED ORDER — RABEPRAZOLE SODIUM 20 MG PO TBEC: 20.0000 mg | DELAYED_RELEASE_TABLET | Freq: Every day | ORAL | 1 refills | Status: DC

## 2022-09-30 NOTE — Telephone Encounter (Signed)
Refill sent in

## 2022-09-30 NOTE — Progress Notes (Signed)
Pt presented today for a B12 injection, pt reports no concerns

## 2022-09-30 NOTE — Telephone Encounter (Signed)
Encourage patient to contact the pharmacy for refills or they can request refills through [place] TO:  [address] [place], [place] ([place]) 20 MG tablet   NOTES/COMMENTS FROM PATIENT:      [place] office please notify patient: It takes 48-72 hours to process rx refill requests Ask patient to call pharmacy to ensure rx is ready before heading there.

## 2022-10-01 ENCOUNTER — Other Ambulatory Visit: Payer: Self-pay

## 2022-10-01 ENCOUNTER — Telehealth: Payer: Self-pay | Admitting: Family Medicine

## 2022-10-01 MED ORDER — MOTEGRITY 2 MG PO TABS: 1.0000 | ORAL_TABLET | Freq: Every day | ORAL | 1 refills | Status: DC

## 2022-10-01 NOTE — Telephone Encounter (Signed)
Called pt vm is not set up. RX was sent to [facility]

## 2022-10-01 NOTE — Telephone Encounter (Signed)
Encourage patient to contact the pharmacy for refills or they can request refills through [facility]  (Please schedule appointment if patient has not been seen in over a year)    WHAT PHARMACY WOULD THEY LIKE THIS SENT TO: [facility] in [place]:  prucalopride succinate (MOTEGRITY) 2 MG  NOTES/COMMENTS FROM PATIENT: Pt stated that she is out to this medication and would like to see if it can be called in today.      [place] office please notify patient: It takes 48-72 hours to process rx refill requests Ask patient to call pharmacy to ensure rx is ready before heading there.

## 2022-10-29 ENCOUNTER — Ambulatory Visit (INDEPENDENT_AMBULATORY_CARE_PROVIDER_SITE_OTHER): Payer: 59

## 2022-10-29 NOTE — Progress Notes (Signed)
Patient presented today for B12 injection, no concerns expressed.

## 2022-11-25 ENCOUNTER — Other Ambulatory Visit (HOSPITAL_BASED_OUTPATIENT_CLINIC_OR_DEPARTMENT_OTHER): Payer: Self-pay | Admitting: Cardiovascular Disease

## 2022-11-25 NOTE — Telephone Encounter (Signed)
Rx(s) sent to pharmacy electronically.  

## 2022-12-02 ENCOUNTER — Ambulatory Visit (INDEPENDENT_AMBULATORY_CARE_PROVIDER_SITE_OTHER): Payer: 59

## 2022-12-02 DIAGNOSIS — E539 Vitamin B deficiency, unspecified: Secondary | ICD-10-CM

## 2022-12-02 MED ORDER — CYANOCOBALAMIN 1000 MCG/ML IJ SOLN
1000.0000 ug | Freq: Once | INTRAMUSCULAR | Status: AC
Start: 2022-12-02 — End: 2022-12-02
  Administered 2022-12-02: 1000 ug via INTRAMUSCULAR

## 2022-12-02 NOTE — Progress Notes (Signed)
Gave pt 1mL of B 12 injection in right deltoid . Pt tolerated well .

## 2022-12-30 ENCOUNTER — Ambulatory Visit (INDEPENDENT_AMBULATORY_CARE_PROVIDER_SITE_OTHER): Payer: 59

## 2022-12-30 DIAGNOSIS — E539 Vitamin B deficiency, unspecified: Secondary | ICD-10-CM

## 2022-12-30 MED ORDER — CYANOCOBALAMIN 1000 MCG/ML IJ SOLN
1000.0000 ug | Freq: Once | INTRAMUSCULAR | Status: AC
Start: 2022-12-30 — End: 2022-12-30
  Administered 2022-12-30: 1000 ug via INTRAMUSCULAR

## 2022-12-30 NOTE — Progress Notes (Signed)
Pt came in for her monthly B 12 injection . Gave 1 mL of B 12 in right deltoid and pt tolerated injection well .

## 2023-01-12 ENCOUNTER — Other Ambulatory Visit: Payer: Self-pay | Admitting: Family Medicine

## 2023-01-13 ENCOUNTER — Encounter: Payer: Self-pay | Admitting: Family Medicine

## 2023-01-13 ENCOUNTER — Other Ambulatory Visit: Payer: Self-pay

## 2023-01-13 ENCOUNTER — Telehealth: Payer: Self-pay

## 2023-01-13 ENCOUNTER — Ambulatory Visit (INDEPENDENT_AMBULATORY_CARE_PROVIDER_SITE_OTHER): Payer: 59 | Admitting: Family Medicine

## 2023-01-13 VITALS — BP 98/58 | HR 98 | Temp 98.2°F | Resp 18 | Ht 63.0 in | Wt 92.1 lb

## 2023-01-13 DIAGNOSIS — Z0000 Encounter for general adult medical examination without abnormal findings: Secondary | ICD-10-CM

## 2023-01-13 DIAGNOSIS — D649 Anemia, unspecified: Secondary | ICD-10-CM

## 2023-01-13 DIAGNOSIS — E539 Vitamin B deficiency, unspecified: Secondary | ICD-10-CM

## 2023-01-13 DIAGNOSIS — E559 Vitamin D deficiency, unspecified: Secondary | ICD-10-CM

## 2023-01-13 DIAGNOSIS — E876 Hypokalemia: Secondary | ICD-10-CM

## 2023-01-13 DIAGNOSIS — E43 Unspecified severe protein-calorie malnutrition: Secondary | ICD-10-CM | POA: Diagnosis not present

## 2023-01-13 DIAGNOSIS — R8290 Unspecified abnormal findings in urine: Secondary | ICD-10-CM

## 2023-01-13 LAB — BASIC METABOLIC PANEL
BUN: 19 mg/dL (ref 6–23)
CO2: 33 mEq/L — ABNORMAL HIGH (ref 19–32)
Calcium: 9.1 mg/dL (ref 8.4–10.5)
Chloride: 97 mEq/L (ref 96–112)
Creatinine, Ser: 0.63 mg/dL (ref 0.40–1.20)
GFR: 109.09 mL/min (ref >60.00)
Glucose, Bld: 104 mg/dL — ABNORMAL HIGH (ref 70–99)
Potassium: 3.2 mEq/L — ABNORMAL LOW (ref 3.5–5.1)
Sodium: 139 mEq/L (ref 135–145)

## 2023-01-13 LAB — CBC WITH DIFFERENTIAL/PLATELET
Basophils Absolute: 0 10*3/uL (ref 0.0–0.1)
Basophils Relative: 0.7 % (ref 0.0–3.0)
Eosinophils Absolute: 0 10*3/uL (ref 0.0–0.7)
Eosinophils Relative: 0.3 % (ref 0.0–5.0)
HCT: 28.6 % — ABNORMAL LOW (ref 36.0–46.0)
Hemoglobin: 8.9 g/dL — ABNORMAL LOW (ref 12.0–15.0)
Lymphocytes Relative: 22.3 % (ref 12.0–46.0)
Lymphs Abs: 1.6 10*3/uL (ref 0.7–4.0)
MCHC: 31.2 g/dL (ref 30.0–36.0)
MCV: 76 fl — ABNORMAL LOW (ref 78.0–100.0)
Monocytes Absolute: 0.7 10*3/uL (ref 0.1–1.0)
Monocytes Relative: 9.8 % (ref 3.0–12.0)
Neutro Abs: 4.8 10*3/uL (ref 1.4–7.7)
Neutrophils Relative %: 66.9 % (ref 43.0–77.0)
Platelets: 472 10*3/uL — ABNORMAL HIGH (ref 150.0–400.0)
RBC: 3.76 Mil/uL — ABNORMAL LOW (ref 3.87–5.11)
RDW: 17 % — ABNORMAL HIGH (ref 11.5–15.5)
WBC: 7.2 10*3/uL (ref 4.0–10.5)

## 2023-01-13 LAB — POCT URINALYSIS DIPSTICK
Glucose, UA: NEGATIVE
Ketones, UA: NEGATIVE
Protein, UA: NEGATIVE
Spec Grav, UA: 1.015 (ref 1.010–1.025)
Urobilinogen, UA: 0.2 E.U./dL
pH, UA: 6.5 (ref 5.0–8.0)

## 2023-01-13 LAB — LIPID PANEL
Cholesterol: 144 mg/dL (ref 0–200)
HDL: 62.1 mg/dL (ref >39.00)
LDL Cholesterol: 68 mg/dL (ref 0–99)
NonHDL: 81.71
Total CHOL/HDL Ratio: 2
Triglycerides: 71 mg/dL (ref 0.0–149.0)
VLDL: 14.2 mg/dL (ref 0.0–40.0)

## 2023-01-13 LAB — VITAMIN D 25 HYDROXY (VIT D DEFICIENCY, FRACTURES): VITD: 34.57 ng/mL (ref 30.00–100.00)

## 2023-01-13 LAB — TSH: TSH: 0.61 u[IU]/mL (ref 0.35–5.50)

## 2023-01-13 LAB — HEPATIC FUNCTION PANEL
ALT: 9 U/L (ref 0–35)
AST: 13 U/L (ref 0–37)
Albumin: 4 g/dL (ref 3.5–5.2)
Alkaline Phosphatase: 61 U/L (ref 39–117)
Bilirubin, Direct: 0.1 mg/dL (ref 0.0–0.3)
Total Bilirubin: 0.5 mg/dL (ref 0.2–1.2)
Total Protein: 6.9 g/dL (ref 6.0–8.3)

## 2023-01-13 LAB — B12 AND FOLATE PANEL
Folate: 12.8 ng/mL (ref >5.9)
Vitamin B-12: 431 pg/mL (ref 211–911)

## 2023-01-13 MED ORDER — CEPHALEXIN 500 MG PO CAPS: 500.0000 mg | ORAL_CAPSULE | Freq: Two times a day (BID) | ORAL | 0 refills | Status: AC

## 2023-01-13 NOTE — Assessment & Plan Note (Signed)
Ongoing issue for pt.  She is down 8 lbs since last visit.  She reports eating regularly.  Admits that she has trouble eating when she is dealing w/ abdominal distension.  Encouraged her to call GI.  Pt agreeable to this.  Check labs to risk stratify.  Will follow.

## 2023-01-13 NOTE — Assessment & Plan Note (Signed)
Check labs and replete prn. 

## 2023-01-13 NOTE — Progress Notes (Signed)
   Subjective:    Patient ID: Stacy [name], female    DOB: [birth year], 42 y.o.   MRN: [id number]  HPI CPE- UTD on pap, mammo, Tdap.  Patient Care Team    Relationship Specialty Notifications Start End  [name], MD PCP - General Family Medicine  08/27/22   Charlott Rakes, MD Consulting Physician Gastroenterology  06/27/17   Philip Aspen, DO Consulting Physician Obstetrics and Gynecology  06/27/17   Everlene Farrier, [place] Consulting Physician Gastroenterology  04/12/19   Maeola Sarah, MD Referring Physician Gastroenterology  07/17/19      Health Maintenance  Topic Date Due   INFLUENZA VACCINE  03/17/2023   PAP SMEAR-Modifier  12/11/2024   DTaP/Tdap/Td (2 - Td or Tdap) 06/02/2025   Hepatitis C Screening  Completed   HIV Screening  Completed   HPV VACCINES  Aged Out   COVID-19 Vaccine  Discontinued      Review of Systems Patient reports no vision/ hearing changes, adenopathy,fever, weight change,  persistant/recurrent hoarseness , swallowing issues, chest pain, palpitations, edema, persistant/recurrent cough, hemoptysis, dyspnea (rest/exertional/paroxysmal nocturnal), gastrointestinal bleeding (melena, rectal bleeding), abdominal pain, significant heartburn, bowel changes, Gyn symptoms (abnormal  bleeding, pain),  syncope, focal weakness, memory loss, numbness & tingling, skin/hair/nail changes, abnormal bruising or bleeding, anxiety, or depression.   + intermittent GU irritation + bloating- pt reports it's hard to eat or be active when bloated.      Objective:   Physical Exam General Appearance:    Alert, cooperative, no distress, appears stated age, very thin  Head:    Normocephalic, without obvious abnormality, atraumatic  Eyes:    Conjunctiva/corneas clear, EOM's intact both eyes  Ears:    Normal TM's and external ear canals, both ears  Nose:   Nares normal, septum midline, mucosa normal, no drainage    or sinus tenderness  Throat:   Lips,  mucosa, and tongue normal; teeth and gums normal  Neck:   Supple, symmetrical, trachea midline, no adenopathy;    Thyroid: no enlargement/tenderness/nodules  Back:     Symmetric, no curvature, ROM normal, no CVA tenderness  Lungs:     Clear to auscultation bilaterally, respirations unlabored  Chest Wall:    No tenderness or deformity   Heart:    Regular rate and rhythm, S1 and S2 normal, no murmur, rub   or gallop  Breast Exam:    Deferred to GYN  Abdomen:     Soft, non-tender, bowel sounds active all four quadrants, mildly distended, but no masses, no organomegaly  Genitalia:    Deferred to GYN  Rectal:    Extremities:   Extremities normal, atraumatic, no cyanosis or edema  Pulses:   2+ and symmetric all extremities  Skin:   Skin color, texture, turgor normal, no rashes or lesions  Lymph nodes:   Cervical, supraclavicular, and axillary nodes normal  Neurologic:   CNII-XII intact, normal strength, sensation and reflexes    throughout          Assessment & Plan:

## 2023-01-13 NOTE — Assessment & Plan Note (Signed)
Pt's PE WNL w/ exception of low BMI.  UTD on pap, mammo, Tdap.  Too young for colonoscopy.  Check labs.  Anticipatory guidance provided.

## 2023-01-13 NOTE — Telephone Encounter (Signed)
-----   Message from [name], MD sent at 01/13/2023  1:01 PM EDT ----- Labs look great w/ a few exceptions  1) your potassium is low.  Please increase your intake of leafy green veggies, citrus fruits, bananas, orange juice, etc.  We will repeat your BMP at a lab only visit in 2 weeks (dx low potassium)  2) your hemoglobin (blood count) has dropped from last check.  Are you having particularly heavy menstrual cycles?  Have you noticed any blood in your stool or dark, tarry stools?  Please make sure you are taking a daily multivitamin with iron and we will repeat your CBC at the same lab visit in 2 weeks (dx low hemoglobin)

## 2023-01-13 NOTE — Telephone Encounter (Signed)
Pt aware of lab results . Lab only visit has been made and the repeat CBC and BMP orders are in

## 2023-01-13 NOTE — Patient Instructions (Signed)
Follow up in 1 year or as needed We'll notify you of your lab results and make any changes if needed Make sure you are eating regularly Call GI about your bloating- but some of this may be related to your urinary infection START the Cephalexin twice daily- take w/ food Call with any questions or concerns Stay Safe!  Stay Healthy! Have a great summer!!

## 2023-01-15 LAB — URINE CULTURE
MICRO NUMBER:: 15020472
SPECIMEN QUALITY:: ADEQUATE

## 2023-01-17 ENCOUNTER — Encounter: Payer: Self-pay | Admitting: Family Medicine

## 2023-01-17 ENCOUNTER — Other Ambulatory Visit: Payer: Self-pay

## 2023-01-17 ENCOUNTER — Telehealth: Payer: Self-pay

## 2023-01-17 DIAGNOSIS — N390 Urinary tract infection, site not specified: Secondary | ICD-10-CM

## 2023-01-17 MED ORDER — NITROFURANTOIN MONOHYD MACRO 100 MG PO CAPS
100.0000 mg | ORAL_CAPSULE | Freq: Two times a day (BID) | ORAL | 0 refills | Status: DC
Start: 2023-01-17 — End: 2023-03-16

## 2023-01-17 NOTE — Telephone Encounter (Signed)
-----   Message from [name], MD sent at 01/17/2023  7:36 AM EDT ----- Your UTI is resistant to the Cephalexin you are taking.  We need to switch to Macrobid 100mg  twice daily x7 days (#14, no refills)

## 2023-01-17 NOTE — Telephone Encounter (Signed)
Pt aware of lab results Macrobid 100 mg # 14 sent to pharmacy

## 2023-01-27 ENCOUNTER — Other Ambulatory Visit (INDEPENDENT_AMBULATORY_CARE_PROVIDER_SITE_OTHER): Payer: 59

## 2023-01-27 DIAGNOSIS — E876 Hypokalemia: Secondary | ICD-10-CM

## 2023-01-27 DIAGNOSIS — D649 Anemia, unspecified: Secondary | ICD-10-CM

## 2023-01-27 LAB — CBC WITH DIFFERENTIAL/PLATELET
Basophils Absolute: 0 10*3/uL (ref 0.0–0.1)
Basophils Relative: 0.5 % (ref 0.0–3.0)
Eosinophils Absolute: 0.1 10*3/uL (ref 0.0–0.7)
Eosinophils Relative: 1.2 % (ref 0.0–5.0)
HCT: 27.4 % — ABNORMAL LOW (ref 36.0–46.0)
Hemoglobin: 8.6 g/dL — ABNORMAL LOW (ref 12.0–15.0)
Lymphocytes Relative: 30.3 % (ref 12.0–46.0)
Lymphs Abs: 2 10*3/uL (ref 0.7–4.0)
MCHC: 31.5 g/dL (ref 30.0–36.0)
MCV: 76.2 fl — ABNORMAL LOW (ref 78.0–100.0)
Monocytes Absolute: 0.7 10*3/uL (ref 0.1–1.0)
Monocytes Relative: 11 % (ref 3.0–12.0)
Neutro Abs: 3.7 10*3/uL (ref 1.4–7.7)
Neutrophils Relative %: 57 % (ref 43.0–77.0)
Platelets: 435 10*3/uL — ABNORMAL HIGH (ref 150.0–400.0)
RBC: 3.59 Mil/uL — ABNORMAL LOW (ref 3.87–5.11)
RDW: 17.9 % — ABNORMAL HIGH (ref 11.5–15.5)
WBC: 6.5 10*3/uL (ref 4.0–10.5)

## 2023-01-27 LAB — BASIC METABOLIC PANEL
BUN: 16 mg/dL (ref 6–23)
CO2: 32 mEq/L (ref 19–32)
Calcium: 9.3 mg/dL (ref 8.4–10.5)
Chloride: 97 mEq/L (ref 96–112)
Creatinine, Ser: 0.45 mg/dL (ref 0.40–1.20)
GFR: 118.27 mL/min (ref >60.00)
Glucose, Bld: 89 mg/dL (ref 70–99)
Potassium: 3.3 mEq/L — ABNORMAL LOW (ref 3.5–5.1)
Sodium: 138 mEq/L (ref 135–145)

## 2023-02-01 ENCOUNTER — Ambulatory Visit (INDEPENDENT_AMBULATORY_CARE_PROVIDER_SITE_OTHER): Payer: 59

## 2023-02-01 DIAGNOSIS — E539 Vitamin B deficiency, unspecified: Secondary | ICD-10-CM

## 2023-02-01 MED ORDER — CYANOCOBALAMIN 1000 MCG/ML IJ SOLN
1000.0000 ug | Freq: Once | INTRAMUSCULAR | Status: AC
Start: 2023-02-01 — End: 2023-02-01
  Administered 2023-02-01: 1000 ug via INTRAMUSCULAR

## 2023-02-23 NOTE — Progress Notes (Signed)
Stacy [name] is a 42 y.o. female presents to the office today for B12 injections, per physician's orders. Vitamin B12 (med), 1mL (dose),  IM (route) was administered L Deltoid (location) today. Patient tolerated injection. Patient next injection due: 03/03/2023, appt made Yes   Ester Rink

## 2023-02-23 NOTE — Addendum Note (Signed)
Addended by: Ester Rink on: 02/23/2023 10:22 AM   Modules accepted: Level of Service

## 2023-02-24 ENCOUNTER — Other Ambulatory Visit: Payer: Self-pay

## 2023-02-24 DIAGNOSIS — D649 Anemia, unspecified: Secondary | ICD-10-CM

## 2023-02-24 NOTE — Telephone Encounter (Signed)
I have informed pt of the referral and she was very understanding . Pt states she brought sample by office on 02/14/23 and gave to [place] .

## 2023-02-24 NOTE — Telephone Encounter (Signed)
I have placed the referral for Hematology

## 2023-02-28 ENCOUNTER — Telehealth: Payer: Self-pay | Admitting: Hematology and Oncology

## 2023-02-28 NOTE — Telephone Encounter (Signed)
Patient is aware of up coming appointment

## 2023-03-02 ENCOUNTER — Other Ambulatory Visit: Payer: Self-pay | Admitting: Family Medicine

## 2023-03-02 DIAGNOSIS — E559 Vitamin D deficiency, unspecified: Secondary | ICD-10-CM

## 2023-03-03 ENCOUNTER — Ambulatory Visit (INDEPENDENT_AMBULATORY_CARE_PROVIDER_SITE_OTHER): Payer: 59

## 2023-03-03 DIAGNOSIS — E539 Vitamin B deficiency, unspecified: Secondary | ICD-10-CM | POA: Diagnosis not present

## 2023-03-03 MED ORDER — CYANOCOBALAMIN 1000 MCG/ML IJ SOLN
1000.0000 ug | Freq: Once | INTRAMUSCULAR | Status: AC
Start: 2023-03-03 — End: 2023-03-03
  Administered 2023-03-03: 1000 ug via INTRAMUSCULAR

## 2023-03-03 NOTE — Telephone Encounter (Signed)
Blima Ledger states the lab stated they could not make out the birthday on container so they tossed sample pt is aware of this

## 2023-03-03 NOTE — Progress Notes (Signed)
Pt came in to get her monthly B 12 injections .  Pt tolerated injection well. Had no concerns today

## 2023-03-15 ENCOUNTER — Other Ambulatory Visit (HOSPITAL_BASED_OUTPATIENT_CLINIC_OR_DEPARTMENT_OTHER): Payer: Self-pay | Admitting: Cardiovascular Disease

## 2023-03-15 DIAGNOSIS — E861 Hypovolemia: Secondary | ICD-10-CM

## 2023-03-16 ENCOUNTER — Encounter: Payer: Self-pay | Admitting: Family Medicine

## 2023-03-16 ENCOUNTER — Ambulatory Visit: Payer: 59 | Admitting: Family Medicine

## 2023-03-16 ENCOUNTER — Encounter (INDEPENDENT_AMBULATORY_CARE_PROVIDER_SITE_OTHER): Payer: Self-pay

## 2023-03-16 VITALS — BP 108/78 | HR 94 | Temp 97.8°F | Resp 17 | Ht 63.0 in | Wt 91.0 lb

## 2023-03-16 DIAGNOSIS — R636 Underweight: Secondary | ICD-10-CM

## 2023-03-16 MED ORDER — MEGESTROL ACETATE 625 MG/5ML PO SUSP: 625.0000 mg | Freq: Every day | ORAL | 3 refills | Status: DC

## 2023-03-16 NOTE — Assessment & Plan Note (Signed)
Ongoing issue for pt.  She reports she has been trying to gain weight for 'years' w/o success.  Will start Megace to hopefully boost appetite and weight.  She is already on Remeron and she has not noticed any improvement.  Encouraged calorie rich foods throughout the day.  Will follow.

## 2023-03-16 NOTE — Telephone Encounter (Signed)
Rx request sent to pharmacy.  

## 2023-03-16 NOTE — Progress Notes (Signed)
   Subjective:    Patient ID: Stacy [name], female    DOB: 18-Mar-1981, 42 y.o.   MRN: [id number]  HPI Underweight- pt reports she wants to be around 110 lbs.  She is eating 3 meals/day with snacks in between.  Has been to nutritionist and has followed their instructions but 'i'm in a rut'.  Already on Mirtazapine w/o gaining weight.  Reports she's been struggling w/ gaining weight 'for years'.  Asking for a mediation to help her.   Review of Systems For ROS see HPI     Objective:   Physical Exam Vitals reviewed.  Constitutional:      Appearance: Normal appearance.     Comments: Very thin, nearly cachectic  HENT:     Head: Normocephalic and atraumatic.  Cardiovascular:     Rate and Rhythm: Normal rate and regular rhythm.  Pulmonary:     Effort: Pulmonary effort is normal. No respiratory distress.  Skin:    General: Skin is warm and dry.  Neurological:     General: No focal deficit present.     Mental Status: She is alert and oriented to person, place, and time.  Psychiatric:        Mood and Affect: Mood normal.        Behavior: Behavior normal.        Thought Content: Thought content normal.           Assessment & Plan:

## 2023-03-16 NOTE — Patient Instructions (Addendum)
Follow up in 2 months to recheck weight START the Megace 5ml daily to help boost appetite and gain weight Continue to eat regularly and eat high calorie foods Call with any questions or concerns Hang in there!  We'll try and get this on track!

## 2023-03-17 ENCOUNTER — Encounter (HOSPITAL_BASED_OUTPATIENT_CLINIC_OR_DEPARTMENT_OTHER): Payer: Self-pay | Admitting: Cardiovascular Disease

## 2023-03-17 DIAGNOSIS — E861 Hypovolemia: Secondary | ICD-10-CM

## 2023-03-17 MED ORDER — MIDODRINE HCL 5 MG PO TABS
5.0000 mg | ORAL_TABLET | Freq: Three times a day (TID) | ORAL | 3 refills | Status: DC
Start: 2023-03-17 — End: 2024-05-31

## 2023-03-24 ENCOUNTER — Inpatient Hospital Stay: Payer: 59

## 2023-03-24 ENCOUNTER — Encounter: Payer: Self-pay | Admitting: Hematology and Oncology

## 2023-03-24 ENCOUNTER — Inpatient Hospital Stay: Payer: 59 | Attending: Hematology and Oncology | Admitting: Hematology and Oncology

## 2023-03-24 VITALS — BP 96/67 | HR 98 | Temp 97.6°F | Resp 18 | Ht 63.0 in | Wt 98.6 lb

## 2023-03-24 DIAGNOSIS — N926 Irregular menstruation, unspecified: Secondary | ICD-10-CM | POA: Diagnosis not present

## 2023-03-24 DIAGNOSIS — K909 Intestinal malabsorption, unspecified: Secondary | ICD-10-CM | POA: Insufficient documentation

## 2023-03-24 DIAGNOSIS — K219 Gastro-esophageal reflux disease without esophagitis: Secondary | ICD-10-CM | POA: Diagnosis not present

## 2023-03-24 DIAGNOSIS — D500 Iron deficiency anemia secondary to blood loss (chronic): Secondary | ICD-10-CM | POA: Diagnosis present

## 2023-03-24 DIAGNOSIS — D509 Iron deficiency anemia, unspecified: Secondary | ICD-10-CM

## 2023-03-24 NOTE — Assessment & Plan Note (Signed)
She has severe GERD like symptoms and is taking antiacid that could inhibit absorption of oral iron supplement For that reason, I recommend intravenous iron infusion We discussed the benefit of GI follow-up and she will consider it

## 2023-03-24 NOTE — Assessment & Plan Note (Signed)
She has intermittent irregular menstruation We discussed the need for future GYN follow-up if this becomes persistent

## 2023-03-24 NOTE — Assessment & Plan Note (Signed)
The most likely cause of her anemia is due to chronic blood loss/malabsorption syndrome. We discussed some of the risks, benefits, and alternatives of intravenous iron infusions. The patient is symptomatic from anemia and the iron level is critically low. She tolerated oral iron supplement poorly and desires to achieved higher levels of iron faster for adequate hematopoesis. Some of the side-effects to be expected including risks of infusion reactions, phlebitis, headaches, nausea and fatigue.  The patient is willing to proceed. Patient education material was dispensed.  Goal is to keep ferritin level greater than 50 and resolution of anemia Based on her recent blood work, anticipate she will need at least 3 doses of intravenous iron sucrose Will plan to see her back in approximately 3 months for further follow-up

## 2023-03-24 NOTE — Progress Notes (Signed)
[facility] OFFICE PROGRESS NOTE  Stacy Hatch, MD  ASSESSMENT & PLAN:  Iron deficiency anemia The most likely cause of her anemia is due to chronic blood loss/malabsorption syndrome. We discussed some of the risks, benefits, and alternatives of intravenous iron infusions. The patient is symptomatic from anemia and the iron level is critically low. She tolerated oral iron supplement poorly and desires to achieved higher levels of iron faster for adequate hematopoesis. Some of the side-effects to be expected including risks of infusion reactions, phlebitis, headaches, nausea and fatigue.  The patient is willing to proceed. Patient education material was dispensed.  Goal is to keep ferritin level greater than 50 and resolution of anemia Based on her recent blood work, anticipate she will need at least 3 doses of intravenous iron sucrose Will plan to see her back in approximately 3 months for further follow-up  Gastroesophageal reflux disease She has severe GERD like symptoms and is taking antiacid that could inhibit absorption of oral iron supplement For that reason, I recommend intravenous iron infusion We discussed the benefit of GI follow-up and she will consider it  Irregular bleeding She has intermittent irregular menstruation We discussed the need for future GYN follow-up if this becomes persistent  Orders Placed This Encounter  Procedures   CBC with Differential (Cancer Center Only)    Standing Status:   Future    Standing Expiration Date:   03/23/2024   Ferritin    Standing Status:   Future    Standing Expiration Date:   03/23/2024   Iron and Iron Binding Capacity ([facility] only)    Standing Status:   Future    Standing Expiration Date:   03/23/2024   Reticulocytes    Standing Status:   Future    Standing Expiration Date:   03/23/2024   Sedimentation rate    Standing Status:   Future    Standing Expiration Date:   03/23/2024    The total time spent in the  appointment was 30 minutes encounter with patients including review of chart and various tests results, discussions about plan of care and coordination of care plan   All questions were answered. The patient knows to call the clinic with any problems, questions or concerns. No barriers to learning was detected.    Artis Delay, MD 8/8/202410:47 AM  INTERVAL HISTORY: Stacy [name] 42 y.o. female returns for further evaluation for microcytic anemia with reactive thrombocytosis The patient was seen several years ago for similar reasons She received intravenous iron infusion for history of iron deficiency anemia She has been getting B12 injections through her primary care doctor I reviewed all her test results The patient have irregular menstrual cycle that was heavy recently She denies other forms of bleeding She is taking Carafate and Pepcid due to chronic stomach issues/GERD She has not been seen by GI doctor for some time She complained of fatigue.  She denies pica  SUMMARY OF HEMATOLOGIC HISTORY:  Please see my original consult note from the hospital dated May 26, 2020 for further details. She has past medical history significant for anxiety, chronic intestinal pseudoobstruction, GERD, failure to thrive.  The patient was admitted to the hospital with dehydration, nausea, vomiting, abdominal pain.  Labs on admission showed a WBC of 14.1 hemoglobin 8.7, normal platelet count 334,000, total protein 4.0, albumin 1.5.  Additional labs performed on 10/1 showed a reticulocyte count percentage of 3.6%, absolute reticulocyte count of 92.1, immature reticulocyte fraction of 24%, vitamin  B12 level was 514, folate 6.0, ferritin 147, iron 23, TIBC 85, percent saturation 27%.  Stool for occult blood was checked this admission on 10/4 and was positive.  The patient has received 2 units PRBCs in that admission and received a dose of Feraheme 510 mg IV on 05/17/2020. The patient was seen by  gastroenterology and underwent upper endoscopy on 10/5 which showed a normal esophagus, diffuse moderately erythematous mucosa without bleeding found in the entire stomach which was biopsied, examined duodenum was normal.  Biopsy of stomach antrum showed mild reactive gastropathy, negative H. pylori, negative for malignancy and biopsy of the stomach body showed reactive gastropathy and no malignancy.   Review of the patient's prior CBC results available to me in epic show that she has been persistently anemic since August 2020.  Prior to that, she had intermittent anemia dating back to 2013.   She has not noticed any bleeding such as epistaxis, hemoptysis, hematemesis, hematuria, hematochezia. She takes oral iron and states that stools are black normally. She reports that she is on vitamin B12 injections monthly at her primary care provider's office. She has never received IV iron prior to this admission but has been taking oral iron twice a day, which she tolerated poorly due to severe constipation. Has only received 1 blood transfusion previously which was during her hospitalization in September 2021. The patient is married and has 2 children. Denies history of alcohol tobacco use. Hematology was asked see the patient to make recommendations regarding her anemia.   She never donated blood.  Up until recently, she denies the need for blood transfusion She has not had any menstruation for many months due to her malnourished state The patient denies any recent signs or symptoms of bleeding such as spontaneous epistaxis, hematuria or hematochezia. She has noticed some passage of dark stool in the past The patient take regular NSAID prior to admission The patient received intravenous iron and B12 injection in 2021   I have reviewed the past medical history, past surgical history, social history and family history with the patient and they are unchanged from previous note.  ALLERGIES:  is allergic to  tetracyclines & related, erythromycin, raspberry, sulfa antibiotics, and sulfonamide derivatives.  MEDICATIONS:  Current Outpatient Medications  Medication Sig Dispense Refill   cholecalciferol (VITAMIN D) 25 MCG (1000 UNIT) tablet TAKE 2 TABLETS (2,000 UNITS TOTAL) BY MOUTH DAILY. 180 tablet 1   cyanocobalamin (,VITAMIN B-12,) 1000 MCG/ML injection Inject 1,000 mcg into the muscle every 30 (thirty) days.     famotidine (PEPCID) 20 MG tablet TAKE 1 TABLET BY MOUTH EVERYDAY AT BEDTIME 90 tablet 2   fludrocortisone (FLORINEF) 0.1 MG tablet TAKE 1 TABLET(0.1 MG) BY MOUTH TWICE DAILY 180 tablet 2   megestrol (MEGACE ES) 625 MG/5ML suspension Take 5 mLs (625 mg total) by mouth daily. 150 mL 3   metoCLOPramide (REGLAN) 5 MG tablet Take 5 mg by mouth 3 (three) times daily before meals.     midodrine (PROAMATINE) 5 MG tablet Take 1 tablet (5 mg total) by mouth 3 (three) times daily with meals. 270 tablet 3   mirtazapine (REMERON) 15 MG tablet TAKE 1 TABLET BY MOUTH EVERYDAY AT BEDTIME 90 tablet 1   montelukast (SINGULAIR) 10 MG tablet TAKE 1 TABLET(10 MG) BY MOUTH DAILY 30 tablet 3   montelukast (SINGULAIR) 10 MG tablet TAKE 1 TABLET(10 MG) BY MOUTH DAILY 30 tablet 3   Multiple Vitamin (MULTIVITAMIN WITH MINERALS) TABS tablet Take 1 tablet by mouth  daily.     polyethylene glycol (MIRALAX / GLYCOLAX) 17 g packet Take 17 g by mouth 3 (three) times daily. 14 each 0   Prucalopride Succinate (MOTEGRITY) 2 MG TABS Take 1 tablet (2 mg total) by mouth daily. 90 tablet 1   RABEprazole (ACIPHEX) 20 MG tablet Take 1 tablet (20 mg total) by mouth daily. 90 tablet 1   sucralfate (CARAFATE) 1 g tablet Take 1 g by mouth 4 (four) times daily.     Vitamin D, Ergocalciferol, (DRISDOL) 1.25 MG (50000 UNIT) CAPS capsule TAKE 1 CAPSULE BY MOUTH EVERY 7 DAYS 12 capsule 3   Current Facility-Administered Medications  Medication Dose Route Frequency Provider Last Rate Last Admin   cyanocobalamin ((VITAMIN B-12)) injection  1,000 mcg  1,000 mcg Intramuscular Q30 days Stacy Hatch, MD   1,000 mcg at 09/30/22 1000   cyanocobalamin ((VITAMIN B-12)) injection 1,000 mcg  1,000 mcg Intramuscular Once Stacy Hatch, MD       cyanocobalamin (VITAMIN B12) injection 1,000 mcg  1,000 mcg Intramuscular Once Stacy Hatch, MD         REVIEW OF SYSTEMS:   Constitutional: Denies fevers, chills or night sweats Eyes: Denies blurriness of vision Ears, nose, mouth, throat, and face: Denies mucositis or sore throat Respiratory: Denies cough, dyspnea or wheezes Cardiovascular: Denies palpitation, chest discomfort or lower extremity swelling Gastrointestinal:  Denies nausea, heartburn or change in bowel habits Skin: Denies abnormal skin rashes Lymphatics: Denies new lymphadenopathy or easy bruising Neurological:Denies numbness, tingling or new weaknesses Behavioral/Psych: Mood is stable, no new changes  All other systems were reviewed with the patient and are negative.  PHYSICAL EXAMINATION: ECOG PERFORMANCE STATUS: 1 - Symptomatic but completely ambulatory  Vitals:   03/24/23 1006  BP: 96/67  Pulse: 98  Resp: 18  Temp: 97.6 F (36.4 C)  SpO2: 99%   Filed Weights   03/24/23 1006  Weight: 98 lb 9.6 oz (44.7 kg)    GENERAL:alert, no distress and comfortable NEURO: alert & oriented x 3 with fluent speech, no focal motor/sensory deficits  LABORATORY DATA:  I have reviewed the data as listed     Component Value Date/Time   NA 138 01/27/2023 0900   NA 137 01/15/2019 0000   K 3.3 (L) 01/27/2023 0900   CL 97 01/27/2023 0900   CO2 32 01/27/2023 0900   GLUCOSE 89 01/27/2023 0900   BUN 16 01/27/2023 0900   BUN 15 01/15/2019 0000   CREATININE 0.45 01/27/2023 0900   CALCIUM 9.3 01/27/2023 0900   PROT 6.9 01/13/2023 0926   ALBUMIN 4.0 01/13/2023 0926   AST 13 01/13/2023 0926   ALT 9 01/13/2023 0926   ALKPHOS 61 01/13/2023 0926   BILITOT 0.5 01/13/2023 0926   GFRNONAA >60 09/09/2021 1030    GFRAA >60 05/20/2020 0502    No results found for: "SPEP", "UPEP"  Lab Results  Component Value Date   WBC 6.5 01/27/2023   NEUTROABS 3.7 01/27/2023   HGB 8.6 Repeated and verified X2. (L) 01/27/2023   HCT 27.4 (L) 01/27/2023   MCV 76.2 (L) 01/27/2023   PLT 435.0 (H) 01/27/2023      Chemistry      Component Value Date/Time   NA 138 01/27/2023 0900   NA 137 01/15/2019 0000   K 3.3 (L) 01/27/2023 0900   CL 97 01/27/2023 0900   CO2 32 01/27/2023 0900   BUN 16 01/27/2023 0900   BUN 15 01/15/2019 0000   CREATININE 0.45 01/27/2023 0900  GLU 91 01/15/2019 0000      Component Value Date/Time   CALCIUM 9.3 01/27/2023 0900   ALKPHOS 61 01/13/2023 0926   AST 13 01/13/2023 0926   ALT 9 01/13/2023 0926   BILITOT 0.5 01/13/2023 0926

## 2023-03-25 ENCOUNTER — Other Ambulatory Visit: Payer: Self-pay | Admitting: Family Medicine

## 2023-04-05 ENCOUNTER — Inpatient Hospital Stay: Payer: 59

## 2023-04-05 VITALS — BP 119/81 | HR 100 | Temp 97.7°F | Resp 18

## 2023-04-05 DIAGNOSIS — D500 Iron deficiency anemia secondary to blood loss (chronic): Secondary | ICD-10-CM | POA: Diagnosis not present

## 2023-04-05 DIAGNOSIS — D509 Iron deficiency anemia, unspecified: Secondary | ICD-10-CM

## 2023-04-05 MED ORDER — SODIUM CHLORIDE 0.9 % IV SOLN: Freq: Once | INTRAVENOUS | Status: AC

## 2023-04-05 MED ORDER — SODIUM CHLORIDE 0.9 % IV SOLN
400.0000 mg | Freq: Once | INTRAVENOUS | Status: AC
  Administered 2023-04-05: 400 mg via INTRAVENOUS
  Filled 2023-04-05: qty 400

## 2023-04-05 NOTE — Progress Notes (Signed)
Pt observed for post venofer infusion. Tolerated tx well without complaint.

## 2023-04-07 ENCOUNTER — Ambulatory Visit: Payer: 59

## 2023-04-07 ENCOUNTER — Telehealth: Payer: 59 | Admitting: Nurse Practitioner

## 2023-04-07 DIAGNOSIS — U071 COVID-19: Secondary | ICD-10-CM

## 2023-04-07 MED ORDER — BENZONATATE 100 MG PO CAPS
100.0000 mg | ORAL_CAPSULE | Freq: Three times a day (TID) | ORAL | 0 refills | Status: DC | PRN
Start: 2023-04-07 — End: 2023-06-10

## 2023-04-07 MED ORDER — ALBUTEROL SULFATE HFA 108 (90 BASE) MCG/ACT IN AERS
2.0000 | INHALATION_SPRAY | Freq: Four times a day (QID) | RESPIRATORY_TRACT | 0 refills | Status: DC | PRN
Start: 2023-04-07 — End: 2023-06-10

## 2023-04-07 MED ORDER — NIRMATRELVIR/RITONAVIR (PAXLOVID)TABLET
3.0000 | ORAL_TABLET | Freq: Two times a day (BID) | ORAL | 0 refills | Status: AC
Start: 2023-04-07 — End: 2023-04-12

## 2023-04-07 NOTE — Progress Notes (Signed)
Virtual Visit Consent   Stacy [name], you are scheduled for a virtual visit with a [facility] provider today. Just as with appointments in the office, your consent must be obtained to participate. Your consent will be active for this visit and any virtual visit you may have with one of our providers in the next 365 days. If you have a MyChart account, a copy of this consent can be sent to you electronically.  As this is a virtual visit, video technology does not allow for your provider to perform a traditional examination. This may limit your provider's ability to fully assess your condition. If your provider identifies any concerns that need to be evaluated in person or the need to arrange testing (such as labs, EKG, etc.), we will make arrangements to do so. Although advances in technology are sophisticated, we cannot ensure that it will always work on either your end or our end. If the connection with a video visit is poor, the visit may have to be switched to a telephone visit. With either a video or telephone visit, we are not always able to ensure that we have a secure connection.  By engaging in this virtual visit, you consent to the provision of healthcare and authorize for your insurance to be billed (if applicable) for the services provided during this visit. Depending on your insurance coverage, you may receive a charge related to this service.  I need to obtain your verbal consent now. Are you willing to proceed with your visit today? Stacy [name] has provided verbal consent on 04/07/2023 for a virtual visit (video or telephone). Viviano Simas, FNP  Date: 04/07/2023 2:58 PM  Virtual Visit via Video Note   I, Viviano Simas, connected with  Stacy [name]  ([id number], Apr 11, 1981) on 04/07/23 at  3:00 PM EDT by a video-enabled telemedicine application and verified that I am speaking with the correct person using two identifiers.  Location: Patient: Virtual Visit Location  Patient: Home Provider: Virtual Visit Location Provider: Home Office   I discussed the limitations of evaluation and management by telemedicine and the availability of in person appointments. The patient expressed understanding and agreed to proceed.    History of Present Illness: Stacy [name] is a 42 y.o. who identifies as a female who was assigned female at birth, and is being seen today after testing positive for COVID with a home test today   Symptom onset was yesterday  Symptoms include:  Dry cough, headache, fatigue, nasal congestion  She does have a fever today 100.5  She has had COVID in the past  Once prior she took an anti-viral with good outcomes unsure which one    Labs in June of this year with GFR 118 Denies any history of kidney disease   She has used an inhaler in the past for a cough with the flu in the past   Problems:  Patient Active Problem List   Diagnosis Date Noted   Irregular bleeding 03/24/2023   Adie's tonic pupil, 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   Anxiety 09/25/2020   Chronic idiopathic constipation 09/25/2020   Weight loss 09/25/2020   Adrenal insufficiency (HCC) 09/16/2020   Amenorrhea 07/21/2020   Orthostatic hypotension    Hypophosphatemia    Hypomagnesemia    Bloating    Nausea and vomiting 04/28/2020   Failure to thrive in adult 04/27/2020   Physical exam 01/01/2020   Generalized abdominal pain 04/25/2019   Hyponatremia 04/25/2019   Hypokalemia 04/25/2019   Severe  protein-calorie malnutrition (HCC) 04/25/2019   Gastroesophageal reflux disease 04/07/2019   Normocytic anemia 04/07/2019   Chronic intestinal pseudo-obstruction 09/05/2018   Iron deficiency anemia 01/22/2018   Vitamin B deficiency 01/22/2018   Vitamin D deficiency 01/22/2018   Underweight 06/27/2017   Cyst of ovary 10/05/2016   Hepatic adenoma 07/27/2013    Allergies:  Allergies  Allergen Reactions   Tetracyclines & Related Swelling    Swelling in spine; required spinal  tap.    Erythromycin Other (See Comments)    Swelling in spine; required spinal tap   Raspberry Rash   Sulfa Antibiotics Rash    hives   Sulfonamide Derivatives Rash    Reaction to cream   Medications:  Current Outpatient Medications:    cholecalciferol (VITAMIN D) 25 MCG (1000 UNIT) tablet, TAKE 2 TABLETS (2,000 UNITS TOTAL) BY MOUTH DAILY., Disp: 180 tablet, Rfl: 1   cyanocobalamin (,VITAMIN B-12,) 1000 MCG/ML injection, Inject 1,000 mcg into the muscle every 30 (thirty) days., Disp: , Rfl:    famotidine (PEPCID) 20 MG tablet, TAKE 1 TABLET BY MOUTH EVERYDAY AT BEDTIME, Disp: 90 tablet, Rfl: 2   fludrocortisone (FLORINEF) 0.1 MG tablet, TAKE 1 TABLET(0.1 MG) BY MOUTH TWICE DAILY, Disp: 180 tablet, Rfl: 2   megestrol (MEGACE ES) 625 MG/5ML suspension, Take 5 mLs (625 mg total) by mouth daily., Disp: 150 mL, Rfl: 3   metoCLOPramide (REGLAN) 5 MG tablet, Take 5 mg by mouth 3 (three) times daily before meals., Disp: , Rfl:    midodrine (PROAMATINE) 5 MG tablet, Take 1 tablet (5 mg total) by mouth 3 (three) times daily with meals., Disp: 270 tablet, Rfl: 3   mirtazapine (REMERON) 15 MG tablet, TAKE 1 TABLET BY MOUTH EVERYDAY AT BEDTIME, Disp: 90 tablet, Rfl: 1   montelukast (SINGULAIR) 10 MG tablet, TAKE 1 TABLET(10 MG) BY MOUTH DAILY, Disp: 30 tablet, Rfl: 3   montelukast (SINGULAIR) 10 MG tablet, TAKE 1 TABLET(10 MG) BY MOUTH DAILY, Disp: 30 tablet, Rfl: 3   MOTEGRITY 2 MG TABS, TAKE 1 TABLET(2 MG) BY MOUTH DAILY, Disp: 90 tablet, Rfl: 1   Multiple Vitamin (MULTIVITAMIN WITH MINERALS) TABS tablet, Take 1 tablet by mouth daily., Disp: , Rfl:    polyethylene glycol (MIRALAX / GLYCOLAX) 17 g packet, Take 17 g by mouth 3 (three) times daily., Disp: 14 each, Rfl: 0   RABEprazole (ACIPHEX) 20 MG tablet, Take 1 tablet (20 mg total) by mouth daily., Disp: 90 tablet, Rfl: 1   sucralfate (CARAFATE) 1 g tablet, Take 1 g by mouth 4 (four) times daily., Disp: , Rfl:    Vitamin D, Ergocalciferol,  (DRISDOL) 1.25 MG (50000 UNIT) CAPS capsule, TAKE 1 CAPSULE BY MOUTH EVERY 7 DAYS, Disp: 12 capsule, Rfl: 3  Current Facility-Administered Medications:    cyanocobalamin ((VITAMIN B-12)) injection 1,000 mcg, 1,000 mcg, Intramuscular, Q30 days, [name], MD, 1,000 mcg at 09/30/22 1000   cyanocobalamin ((VITAMIN B-12)) injection 1,000 mcg, 1,000 mcg, Intramuscular, Once, [name], MD   cyanocobalamin (VITAMIN B12) injection 1,000 mcg, 1,000 mcg, Intramuscular, Once, [name], MD  Observations/Objective: Patient is well-developed, well-nourished in no acute distress.  Resting comfortably  at home.  Head is normocephalic, atraumatic.  No labored breathing.  Speech is clear and coherent with logical content.  Patient is alert and oriented at baseline.    Assessment and Plan:  1. COVID-19  - nirmatrelvir/ritonavir (PAXLOVID) 20 x 150 MG & 10 x 100MG  TABS; Take 3 tablets by mouth 2 (two) times  daily for 5 days. (Take nirmatrelvir 150 mg two tablets twice daily for 5 days and ritonavir 100 mg one tablet twice daily for 5 days) Patient GFR is 118  Dispense: 30 tablet; Refill: 0 - albuterol (VENTOLIN HFA) 108 (90 Base) MCG/ACT inhaler; Inhale 2 puffs into the lungs every 6 (six) hours as needed for wheezing or shortness of breath.  Dispense: 8 g; Refill: 0 - benzonatate (TESSALON) 100 MG capsule; Take 1 capsule (100 mg total) by mouth 3 (three) times daily as needed.  Dispense: 30 capsule; Refill: 0     Follow Up Instructions: I discussed the assessment and treatment plan with the patient. The patient was provided an opportunity to ask questions and all were answered. The patient agreed with the plan and demonstrated an understanding of the instructions.  A copy of instructions were sent to the patient via MyChart unless otherwise noted below.    The patient was advised to call back or seek an in-person evaluation if the symptoms worsen or if the condition fails to  improve as anticipated.  Time:  I spent 14 minutes with the patient via telehealth technology discussing the above problems/concerns.    Viviano Simas, FNP

## 2023-04-11 ENCOUNTER — Telehealth: Payer: 59 | Admitting: Family Medicine

## 2023-04-11 ENCOUNTER — Inpatient Hospital Stay: Payer: 59

## 2023-04-11 VITALS — BP 140/93 | HR 88 | Temp 98.8°F | Resp 20 | Wt 95.5 lb

## 2023-04-11 DIAGNOSIS — D509 Iron deficiency anemia, unspecified: Secondary | ICD-10-CM

## 2023-04-11 DIAGNOSIS — D500 Iron deficiency anemia secondary to blood loss (chronic): Secondary | ICD-10-CM | POA: Diagnosis not present

## 2023-04-11 MED ORDER — SODIUM CHLORIDE 0.9 % IV SOLN: INTRAVENOUS | Status: DC

## 2023-04-11 NOTE — Progress Notes (Signed)
Pt. complains of dizziness, no chest pain and no shortness of breath noted. Blood pressure low 80/49 and 84/54 and pt. States she takes medication to elevate her blood pressure and took this am. Per Santiago Glad PA, no Venofer today and pt. to receive 1 liter of normal saline.

## 2023-04-11 NOTE — Patient Instructions (Signed)

## 2023-04-12 ENCOUNTER — Telehealth: Payer: Self-pay | Admitting: Hematology and Oncology

## 2023-04-12 NOTE — Telephone Encounter (Signed)
Patient is aware of updated infusion scheduled as well as follow up appointment with Dr. Bertis Ruddy

## 2023-04-13 ENCOUNTER — Ambulatory Visit: Payer: 59

## 2023-04-20 ENCOUNTER — Ambulatory Visit: Payer: 59

## 2023-04-21 ENCOUNTER — Ambulatory Visit (INDEPENDENT_AMBULATORY_CARE_PROVIDER_SITE_OTHER): Payer: 59

## 2023-04-21 DIAGNOSIS — E539 Vitamin B deficiency, unspecified: Secondary | ICD-10-CM | POA: Diagnosis not present

## 2023-04-21 NOTE — Progress Notes (Signed)
Stacy [name] is a 42 y.o. female presents to the office today for :B12 injections, per physician's orders.  Cyanocobalamin (med), 1032mcg/mL (dose),  IM (route) was administered Lt Deltoid  (location) today. Patient tolerated injection. Patient due for follow up labs/provider appt: No.  Patient next injection due: 1 month, appt made Yes  [name]

## 2023-04-22 ENCOUNTER — Inpatient Hospital Stay: Payer: 59 | Attending: Hematology and Oncology

## 2023-04-22 VITALS — BP 112/75 | HR 104 | Temp 98.2°F | Resp 18

## 2023-04-22 DIAGNOSIS — D509 Iron deficiency anemia, unspecified: Secondary | ICD-10-CM

## 2023-04-22 DIAGNOSIS — N926 Irregular menstruation, unspecified: Secondary | ICD-10-CM | POA: Diagnosis present

## 2023-04-22 DIAGNOSIS — D500 Iron deficiency anemia secondary to blood loss (chronic): Secondary | ICD-10-CM | POA: Insufficient documentation

## 2023-04-22 MED ORDER — SODIUM CHLORIDE 0.9 % IV SOLN: Freq: Once | INTRAVENOUS | Status: AC

## 2023-04-22 MED ORDER — SODIUM CHLORIDE 0.9 % IV SOLN
400.0000 mg | Freq: Once | INTRAVENOUS | Status: AC
  Administered 2023-04-22: 400 mg via INTRAVENOUS
  Filled 2023-04-22: qty 400

## 2023-04-22 NOTE — Patient Instructions (Signed)
Iron Sucrose Injection What is this medication? IRON SUCROSE (EYE ern SOO krose) treats low levels of iron (iron deficiency anemia) in people with kidney disease. Iron is a mineral that plays an important role in making red blood cells, which carry oxygen from your lungs to the rest of your body. This medicine may be used for other purposes; ask your health care provider or pharmacist if you have questions. COMMON BRAND NAME(S): Venofer What should I tell my care team before I take this medication? They need to know if you have any of these conditions: Anemia not caused by low iron levels Heart disease High levels of iron in the blood Kidney disease Liver disease An unusual or allergic reaction to iron, other medications, foods, dyes, or preservatives Pregnant or trying to get pregnant Breastfeeding How should I use this medication? This medication is for infusion into a vein. It is given in a hospital or clinic setting. Talk to your care team about the use of this medication in children. While this medication may be prescribed for children as young as 2 years for selected conditions, precautions do apply. Overdosage: If you think you have taken too much of this medicine contact a poison control center or emergency room at once. NOTE: This medicine is only for you. Do not share this medicine with others. What if I miss a dose? Keep appointments for follow-up doses. It is important not to miss your dose. Call your care team if you are unable to keep an appointment. What may interact with this medication? Do not take this medication with any of the following: Deferoxamine Dimercaprol Other iron products This medication may also interact with the following: Chloramphenicol Deferasirox This list may not describe all possible interactions. Give your health care provider a list of all the medicines, herbs, non-prescription drugs, or dietary supplements you use. Also tell them if you smoke,  drink alcohol, or use illegal drugs. Some items may interact with your medicine. What should I watch for while using this medication? Visit your care team regularly. Tell your care team if your symptoms do not start to get better or if they get worse. You may need blood work done while you are taking this medication. You may need to follow a special diet. Talk to your care team. Foods that contain iron include: whole grains/cereals, dried fruits, beans, or peas, leafy green vegetables, and organ meats (liver, kidney). What side effects may I notice from receiving this medication? Side effects that you should report to your care team as soon as possible: Allergic reactions--skin rash, itching, hives, swelling of the face, lips, tongue, or throat Low blood pressure--dizziness, feeling faint or lightheaded, blurry vision Shortness of breath Side effects that usually do not require medical attention (report to your care team if they continue or are bothersome): Flushing Headache Joint pain Muscle pain Nausea Pain, redness, or irritation at injection site This list may not describe all possible side effects. Call your doctor for medical advice about side effects. You may report side effects to FDA at 1-800-FDA-1088. Where should I keep my medication? This medication is given in a hospital or clinic. It will not be stored at home. NOTE: This sheet is a summary. It may not cover all possible information. If you have questions about this medicine, talk to your doctor, pharmacist, or health care provider.  2024 Elsevier/Gold Standard (2023-01-07 00:00:00)

## 2023-04-27 ENCOUNTER — Other Ambulatory Visit: Payer: Self-pay

## 2023-04-27 ENCOUNTER — Telehealth: Payer: Self-pay

## 2023-04-27 ENCOUNTER — Telehealth: Payer: Self-pay | Admitting: Family Medicine

## 2023-04-27 ENCOUNTER — Other Ambulatory Visit (HOSPITAL_COMMUNITY): Payer: Self-pay

## 2023-04-27 ENCOUNTER — Encounter: Payer: Self-pay | Admitting: Hematology and Oncology

## 2023-04-27 MED ORDER — MOTEGRITY 2 MG PO TABS: 2.0000 mg | ORAL_TABLET | ORAL | 1 refills | Status: AC

## 2023-04-27 NOTE — Telephone Encounter (Signed)
I have advised the pt.

## 2023-04-27 NOTE — Telephone Encounter (Signed)
Encourage patient to contact the pharmacy for refills or they can request refills through [facility]    WHAT PHARMACY WOULD THEY LIKE THIS SENT TO:  [id number] - [place], [place] - [address]  [address], [place]  [address]   MEDICATION NAME & DOSE: MOTEGRITY 2 MG TABS   NOTES/COMMENTS FROM PATIENT:      Front office please notify patient: It takes 48-72 hours to process rx refill requests Ask patient to call pharmacy to ensure rx is ready before heading there.

## 2023-04-27 NOTE — Telephone Encounter (Signed)
Can you submit a PA for Pt Rx Motegrity  2 mg ? Dx K59.89. K59.04

## 2023-04-27 NOTE — Telephone Encounter (Signed)
Ran test claim, came back refill too soon. A 90 day supply was filled on 03/28/23. Insurance will allow another fill on 06/04/23. PA was previously approved through 10/05/23

## 2023-04-28 ENCOUNTER — Other Ambulatory Visit: Payer: Self-pay | Admitting: Hematology and Oncology

## 2023-04-29 ENCOUNTER — Inpatient Hospital Stay: Payer: 59

## 2023-04-29 ENCOUNTER — Encounter: Payer: Self-pay | Admitting: Hematology and Oncology

## 2023-04-29 VITALS — BP 147/106 | HR 87 | Temp 98.2°F | Resp 18 | Wt 100.1 lb

## 2023-04-29 DIAGNOSIS — D509 Iron deficiency anemia, unspecified: Secondary | ICD-10-CM

## 2023-04-29 DIAGNOSIS — D500 Iron deficiency anemia secondary to blood loss (chronic): Secondary | ICD-10-CM | POA: Diagnosis not present

## 2023-04-29 MED ORDER — SODIUM CHLORIDE 0.9 % IV SOLN
400.0000 mg | Freq: Once | INTRAVENOUS | Status: AC
  Administered 2023-04-29: 400 mg via INTRAVENOUS
  Filled 2023-04-29: qty 400

## 2023-04-29 MED ORDER — SODIUM CHLORIDE 0.9 % IV SOLN: INTRAVENOUS | Status: DC

## 2023-04-29 NOTE — Patient Instructions (Signed)
Iron Sucrose Injection What is this medication? IRON SUCROSE (EYE ern SOO krose) treats low levels of iron (iron deficiency anemia) in people with kidney disease. Iron is a mineral that plays an important role in making red blood cells, which carry oxygen from your lungs to the rest of your body. This medicine may be used for other purposes; ask your health care provider or pharmacist if you have questions. COMMON BRAND NAME(S): Venofer What should I tell my care team before I take this medication? They need to know if you have any of these conditions: Anemia not caused by low iron levels Heart disease High levels of iron in the blood Kidney disease Liver disease An unusual or allergic reaction to iron, other medications, foods, dyes, or preservatives Pregnant or trying to get pregnant Breastfeeding How should I use this medication? This medication is for infusion into a vein. It is given in a hospital or clinic setting. Talk to your care team about the use of this medication in children. While this medication may be prescribed for children as young as 2 years for selected conditions, precautions do apply. Overdosage: If you think you have taken too much of this medicine contact a poison control center or emergency room at once. NOTE: This medicine is only for you. Do not share this medicine with others. What if I miss a dose? Keep appointments for follow-up doses. It is important not to miss your dose. Call your care team if you are unable to keep an appointment. What may interact with this medication? Do not take this medication with any of the following: Deferoxamine Dimercaprol Other iron products This medication may also interact with the following: Chloramphenicol Deferasirox This list may not describe all possible interactions. Give your health care provider a list of all the medicines, herbs, non-prescription drugs, or dietary supplements you use. Also tell them if you smoke,  drink alcohol, or use illegal drugs. Some items may interact with your medicine. What should I watch for while using this medication? Visit your care team regularly. Tell your care team if your symptoms do not start to get better or if they get worse. You may need blood work done while you are taking this medication. You may need to follow a special diet. Talk to your care team. Foods that contain iron include: whole grains/cereals, dried fruits, beans, or peas, leafy green vegetables, and organ meats (liver, kidney). What side effects may I notice from receiving this medication? Side effects that you should report to your care team as soon as possible: Allergic reactions--skin rash, itching, hives, swelling of the face, lips, tongue, or throat Low blood pressure--dizziness, feeling faint or lightheaded, blurry vision Shortness of breath Side effects that usually do not require medical attention (report to your care team if they continue or are bothersome): Flushing Headache Joint pain Muscle pain Nausea Pain, redness, or irritation at injection site This list may not describe all possible side effects. Call your doctor for medical advice about side effects. You may report side effects to FDA at 1-800-FDA-1088. Where should I keep my medication? This medication is given in a hospital or clinic. It will not be stored at home. NOTE: This sheet is a summary. It may not cover all possible information. If you have questions about this medicine, talk to your doctor, pharmacist, or health care provider.  2024 Elsevier/Gold Standard (2023-01-07 00:00:00)

## 2023-04-29 NOTE — Progress Notes (Signed)
Patient declines to wait 30 minute observation period post iron infusion. Patient well feeling at end of appointment, no dizziness. Blood pressure elevated above patient normal. Patient and spouse saying might be due to medication she is one to increase her blood pressure as well as recent weight gain. Patient will follow up with prescriber.

## 2023-05-05 ENCOUNTER — Encounter (HOSPITAL_BASED_OUTPATIENT_CLINIC_OR_DEPARTMENT_OTHER): Payer: Self-pay | Admitting: Cardiovascular Disease

## 2023-05-17 ENCOUNTER — Encounter: Payer: Self-pay | Admitting: Family Medicine

## 2023-05-17 ENCOUNTER — Ambulatory Visit: Payer: 59 | Admitting: Family Medicine

## 2023-05-17 VITALS — BP 108/70 | HR 103 | Temp 97.9°F | Wt 97.4 lb

## 2023-05-17 DIAGNOSIS — E43 Unspecified severe protein-calorie malnutrition: Secondary | ICD-10-CM | POA: Diagnosis not present

## 2023-05-17 NOTE — Progress Notes (Signed)
Subjective:    Patient ID: Stacy [name], female    DOB: Dec 03, 1980, 42 y.o.   MRN: [id number]  HPI Underweight- she started Megace at last visit.  pt has gained 6 lbs since 7/31.  Pt reports feeling 'good'.  Pt reports she is eating more than she was.  Food is staying down.  Pt reports nausea and vomiting pretty well controlled.   Review of Systems For ROS see HPI     Objective:   Physical Exam Vitals reviewed.  Constitutional:      General: She is not in acute distress.    Appearance: She is not ill-appearing.  HENT:     Head: Normocephalic and atraumatic.  Cardiovascular:     Rate and Rhythm: Normal rate and regular rhythm.  Pulmonary:     Effort: Pulmonary effort is normal. No respiratory distress.     Breath sounds: No wheezing or rhonchi.  Abdominal:     General: There is no distension.     Palpations: Abdomen is soft.     Tenderness: There is no abdominal tenderness. There is no guarding or rebound.  Skin:    General: Skin is warm and dry.  Neurological:     General: No focal deficit present.     Mental Status: She is alert and oriented to person, place, and time.  Psychiatric:        Mood and Affect: Mood normal.        Behavior: Behavior normal.        Thought Content: Thought content normal.           Assessment & Plan:

## 2023-05-17 NOTE — Patient Instructions (Addendum)
Schedule your complete physical after 6/1 Continue the Megace daily Continue to eat regularly and focus on those high calorie foods Call with any questions or concerns Happy Fall!!

## 2023-05-22 NOTE — Assessment & Plan Note (Signed)
Slowly improving.  She started Megace at last visit and has gained 6 lbs since 7/31.  This is in addition to Remron.  She reports feeling well.  No changes at this time.  Will continue to follow.

## 2023-05-24 ENCOUNTER — Ambulatory Visit: Payer: 59

## 2023-05-24 DIAGNOSIS — E539 Vitamin B deficiency, unspecified: Secondary | ICD-10-CM | POA: Diagnosis not present

## 2023-05-24 DIAGNOSIS — Z23 Encounter for immunization: Secondary | ICD-10-CM | POA: Diagnosis not present

## 2023-05-24 NOTE — Progress Notes (Addendum)
Stacy [name] is a 43 y.o. female presents to the office today for :B12 injections, per physician's orders.   Cyanocobalamin (med), 1041mcg/mL (dose),  IM (route) was administered Rt Deltoid  (location) today. Patient tolerated injection. Patient due for follow up labs/provider appt: No.  Patient next injection due: 1 month, appt made Yes  Patient also received Flu vaccine in the Lt Deltoid without issue  [name]

## 2023-05-31 ENCOUNTER — Telehealth: Payer: Self-pay | Admitting: Family Medicine

## 2023-05-31 ENCOUNTER — Other Ambulatory Visit: Payer: Self-pay | Admitting: Family Medicine

## 2023-05-31 MED ORDER — MEGESTROL ACETATE 625 MG/5ML PO SUSP: 625.0000 mg | Freq: Every day | ORAL | 3 refills | Status: DC

## 2023-05-31 NOTE — Telephone Encounter (Signed)
Encourage patient to contact the pharmacy for refills or they can request refills through [facility]   WHAT PHARMACY WOULD THEY LIKE THIS SENT TO:  [facility] #[id number] - [place], [place] - [address]  [address], [place] [address]    MEDICATION NAME & DOSE: megestrol (MEGACE ES) 625 MG/5ML suspension  NOTES/COMMENTS FROM PATIENT:      Front office please notify patient: It takes 48-72 hours to process rx refill requests Ask patient to call pharmacy to ensure rx is ready before heading there.

## 2023-05-31 NOTE — Telephone Encounter (Signed)
Sent to pharmacy and informed patient

## 2023-06-01 ENCOUNTER — Other Ambulatory Visit: Payer: Self-pay

## 2023-06-01 DIAGNOSIS — J0110 Acute frontal sinusitis, unspecified: Secondary | ICD-10-CM

## 2023-06-01 MED ORDER — MONTELUKAST SODIUM 10 MG PO TABS: 10.0000 mg | ORAL_TABLET | Freq: Every day | ORAL | 3 refills | Status: DC

## 2023-06-10 ENCOUNTER — Ambulatory Visit: Payer: 59 | Admitting: Family Medicine

## 2023-06-10 ENCOUNTER — Encounter: Payer: Self-pay | Admitting: Family Medicine

## 2023-06-10 VITALS — BP 98/64 | HR 96 | Temp 98.1°F | Ht 63.0 in | Wt 101.2 lb

## 2023-06-10 DIAGNOSIS — R52 Pain, unspecified: Secondary | ICD-10-CM | POA: Diagnosis not present

## 2023-06-10 DIAGNOSIS — R051 Acute cough: Secondary | ICD-10-CM

## 2023-06-10 DIAGNOSIS — B9689 Other specified bacterial agents as the cause of diseases classified elsewhere: Secondary | ICD-10-CM | POA: Diagnosis not present

## 2023-06-10 DIAGNOSIS — J329 Chronic sinusitis, unspecified: Secondary | ICD-10-CM

## 2023-06-10 LAB — POC INFLUENZA A&B (BINAX/QUICKVUE)
Influenza A, POC: NEGATIVE
Influenza B, POC: NEGATIVE

## 2023-06-10 LAB — POC COVID19 BINAXNOW: SARS Coronavirus 2 Ag: NEGATIVE

## 2023-06-10 MED ORDER — AMOXICILLIN 875 MG PO TABS: 875.0000 mg | ORAL_TABLET | Freq: Two times a day (BID) | ORAL | 0 refills | Status: AC

## 2023-06-10 NOTE — Patient Instructions (Addendum)
Follow up as needed or as scheduled START the Amoxicillin twice daily- take w/ food Drink LOTS of fluids Tylenol/Ibuprofen as needed for pain/fever Call with any questions or concerns Hang in there!!!

## 2023-06-10 NOTE — Progress Notes (Signed)
Subjective:    Patient ID: Stacy [name], female    DOB: 1980-11-14, 42 y.o.   MRN: [id number]  HPI URI- 'pretty sure I have a sinus infxn'.  + nasal congestion, thick yellow drainage, maxillary sinus pain.  + HA, pressure in the head.  Some upper tooth pain.  No documented fever.  No cough.     Review of Systems For ROS see HPI     Objective:   Physical Exam Vitals reviewed.  Constitutional:      General: She is not in acute distress.    Appearance: She is well-developed.  HENT:     Head: Normocephalic and atraumatic.     Right Ear: Tympanic membrane normal.     Left Ear: Tympanic membrane normal.     Nose: Mucosal edema and congestion present. No rhinorrhea.     Right Sinus: Maxillary sinus tenderness present. No frontal sinus tenderness.     Left Sinus: Maxillary sinus tenderness present. No frontal sinus tenderness.     Mouth/Throat:     Pharynx: Uvula midline. Posterior oropharyngeal erythema present. No oropharyngeal exudate.  Eyes:     Conjunctiva/sclera: Conjunctivae normal.     Pupils: Pupils are equal, round, and reactive to light.  Cardiovascular:     Rate and Rhythm: Normal rate and regular rhythm.     Heart sounds: Normal heart sounds.  Pulmonary:     Effort: Pulmonary effort is normal. No respiratory distress.     Breath sounds: Normal breath sounds. No wheezing.  Musculoskeletal:     Cervical back: Normal range of motion and neck supple.  Lymphadenopathy:     Cervical: No cervical adenopathy.  Skin:    General: Skin is warm and dry.  Neurological:     General: No focal deficit present.     Mental Status: She is alert and oriented to person, place, and time.     Cranial Nerves: No cranial nerve deficit.     Motor: No weakness.     Coordination: Coordination normal.  Psychiatric:        Mood and Affect: Mood normal.        Behavior: Behavior normal.        Thought Content: Thought content normal.           Assessment & Plan:  Bacterial  sinusitis- pt has hx of similar and this is consistent w/ previous dx.  COVID and flu negative.  Start Amoxicillin BID x10 days.  Reviewed supportive care and red flags that should prompt return.  Pt expressed understanding and is in agreement w/ plan.

## 2023-06-13 ENCOUNTER — Inpatient Hospital Stay: Payer: 59 | Attending: Hematology and Oncology

## 2023-06-13 DIAGNOSIS — R627 Adult failure to thrive: Secondary | ICD-10-CM | POA: Diagnosis not present

## 2023-06-13 DIAGNOSIS — Z862 Personal history of diseases of the blood and blood-forming organs and certain disorders involving the immune mechanism: Secondary | ICD-10-CM | POA: Diagnosis present

## 2023-06-13 DIAGNOSIS — D509 Iron deficiency anemia, unspecified: Secondary | ICD-10-CM

## 2023-06-13 LAB — CBC WITH DIFFERENTIAL (CANCER CENTER ONLY)
Abs Immature Granulocytes: 0.1 10*3/uL — ABNORMAL HIGH (ref 0.00–0.07)
Basophils Absolute: 0 10*3/uL (ref 0.0–0.1)
Basophils Relative: 0 %
Eosinophils Absolute: 0.1 10*3/uL (ref 0.0–0.5)
Eosinophils Relative: 1 %
HCT: 37.1 % (ref 36.0–46.0)
Hemoglobin: 12.4 g/dL (ref 12.0–15.0)
Immature Granulocytes: 1 %
Lymphocytes Relative: 30 %
Lymphs Abs: 2.8 10*3/uL (ref 0.7–4.0)
MCH: 28.9 pg (ref 26.0–34.0)
MCHC: 33.4 g/dL (ref 30.0–36.0)
MCV: 86.5 fL (ref 80.0–100.0)
Monocytes Absolute: 0.7 10*3/uL (ref 0.1–1.0)
Monocytes Relative: 8 %
Neutro Abs: 5.5 10*3/uL (ref 1.7–7.7)
Neutrophils Relative %: 60 %
Platelet Count: 416 10*3/uL — ABNORMAL HIGH (ref 150–400)
RBC: 4.29 MIL/uL (ref 3.87–5.11)
RDW: 16.3 % — ABNORMAL HIGH (ref 11.5–15.5)
WBC Count: 9.2 10*3/uL (ref 4.0–10.5)
nRBC: 0 % (ref 0.0–0.2)

## 2023-06-13 LAB — FERRITIN: Ferritin: 74 ng/mL (ref 11–307)

## 2023-06-13 LAB — IRON AND IRON BINDING CAPACITY (CC-WL,HP ONLY)
Iron: 79 ug/dL (ref 28–170)
Saturation Ratios: 23 % (ref 10.4–31.8)
TIBC: 346 ug/dL (ref 250–450)
UIBC: 267 ug/dL (ref 148–442)

## 2023-06-13 LAB — RETICULOCYTES
Immature Retic Fract: 14.7 % (ref 2.3–15.9)
RBC.: 4.32 MIL/uL (ref 3.87–5.11)
Retic Count, Absolute: 52.3 10*3/uL (ref 19.0–186.0)
Retic Ct Pct: 1.2 % (ref 0.4–3.1)

## 2023-06-13 LAB — SEDIMENTATION RATE: Sed Rate: 22 mm/h (ref 0–22)

## 2023-06-16 ENCOUNTER — Encounter: Payer: Self-pay | Admitting: Hematology and Oncology

## 2023-06-16 ENCOUNTER — Inpatient Hospital Stay: Payer: 59 | Admitting: Hematology and Oncology

## 2023-06-16 ENCOUNTER — Other Ambulatory Visit: Payer: Self-pay | Admitting: Hematology and Oncology

## 2023-06-16 VITALS — BP 92/72 | HR 54 | Temp 97.8°F | Resp 18 | Ht 63.0 in | Wt 103.4 lb

## 2023-06-16 DIAGNOSIS — Z862 Personal history of diseases of the blood and blood-forming organs and certain disorders involving the immune mechanism: Secondary | ICD-10-CM | POA: Diagnosis not present

## 2023-06-16 DIAGNOSIS — D509 Iron deficiency anemia, unspecified: Secondary | ICD-10-CM

## 2023-06-16 DIAGNOSIS — R627 Adult failure to thrive: Secondary | ICD-10-CM

## 2023-06-16 DIAGNOSIS — K219 Gastro-esophageal reflux disease without esophagitis: Secondary | ICD-10-CM | POA: Diagnosis not present

## 2023-06-16 NOTE — Assessment & Plan Note (Signed)
She continues to have significant reflux disease Her antiacid medications might interfere with iron and B12 absorption She will continue vitamin B12 injection through her primary care doctor

## 2023-06-16 NOTE — Assessment & Plan Note (Signed)
Iron deficiency anemia has resolved Her recent treatment was 3 years away from her original treatment in 2021 It is hard to predict when she might need intravenous iron infusion again I recommend close monitoring and follow-up with her primary care doctor and she will call me if she has signs or symptoms of anemia again

## 2023-06-16 NOTE — Assessment & Plan Note (Signed)
She continues Megace seems to help with her appetite Megace might stop her menstrual cycle and might keep her blood counts stable I will defer to her primary care doctor for management

## 2023-06-16 NOTE — Progress Notes (Signed)
[facility] OFFICE PROGRESS NOTE  [name], MD  ASSESSMENT & PLAN:  Iron deficiency anemia Iron deficiency anemia has resolved Her recent treatment was 3 years away from her original treatment in 2021 It is hard to predict when she might need intravenous iron infusion again I recommend close monitoring and follow-up with her primary care doctor and she will call me if she has signs or symptoms of anemia again  Failure to thrive in adult She continues Megace seems to help with her appetite Megace might stop her menstrual cycle and might keep her blood counts stable I will defer to her primary care doctor for management  Gastroesophageal reflux disease She continues to have significant reflux disease Her antiacid medications might interfere with iron and B12 absorption She will continue vitamin B12 injection through her primary care doctor  No orders of the defined types were placed in this encounter.   The total time spent in the appointment was 20 minutes encounter with patients including review of chart and various tests results, discussions about plan of care and coordination of care plan   All questions were answered. The patient knows to call the clinic with any problems, questions or concerns. No barriers to learning was detected.    Artis Delay, MD 10/31/202410:11 AM  INTERVAL HISTORY: Stacy [name] 42 y.o. female returns for further follow-up for recurrent iron deficiency anemia She is doing well and felt great since recent intravenous iron infusion We reviewed test results and discussed future plan of care  SUMMARY OF HEMATOLOGIC HISTORY:  Please see my original consult note from the hospital dated May 26, 2020 for further details. She has past medical history significant for anxiety, chronic intestinal pseudoobstruction, GERD, failure to thrive.  The patient was admitted to the hospital with dehydration, nausea, vomiting,  abdominal pain.  Labs on admission showed a WBC of 14.1 hemoglobin 8.7, normal platelet count 334,000, total protein 4.0, albumin 1.5.  Additional labs performed on 10/1 showed a reticulocyte count percentage of 3.6%, absolute reticulocyte count of 92.1, immature reticulocyte fraction of 24%, vitamin B12 level was 514, folate 6.0, ferritin 147, iron 23, TIBC 85, percent saturation 27%.  Stool for occult blood was checked this admission on 10/4 and was positive.  The patient has received 2 units PRBCs in that admission and received a dose of Feraheme 510 mg IV on 05/17/2020. The patient was seen by gastroenterology and underwent upper endoscopy on 10/5 which showed a normal esophagus, diffuse moderately erythematous mucosa without bleeding found in the entire stomach which was biopsied, examined duodenum was normal.  Biopsy of stomach antrum showed mild reactive gastropathy, negative H. pylori, negative for malignancy and biopsy of the stomach body showed reactive gastropathy and no malignancy.   Review of the patient's prior CBC results available to me in epic show that she has been persistently anemic since August 2020.  Prior to that, she had intermittent anemia dating back to 2013.   She has not noticed any bleeding such as epistaxis, hemoptysis, hematemesis, hematuria, hematochezia. She takes oral iron and states that stools are black normally. She reports that she is on vitamin B12 injections monthly at her primary care provider's office. She has never received IV iron prior to this admission but has been taking oral iron twice a day, which she tolerated poorly due to severe constipation. Has only received 1 blood transfusion previously which was during her hospitalization in September 2021. The patient is married and has  2 children. Denies history of alcohol tobacco use. Hematology was asked see the patient to make recommendations regarding her anemia.   She never donated blood.  Up until recently, she  denies the need for blood transfusion She has not had any menstruation for many months due to her malnourished state The patient denies any recent signs or symptoms of bleeding such as spontaneous epistaxis, hematuria or hematochezia. She has noticed some passage of dark stool in the past The patient take regular NSAID prior to admission The patient received intravenous iron and B12 injection in 2021 and again in September 2024   I have reviewed the past medical history, past surgical history, social history and family history with the patient and they are unchanged from previous note.  ALLERGIES:  is allergic to tetracyclines & related, erythromycin, raspberry, sulfa antibiotics, and sulfonamide derivatives.  MEDICATIONS:  Current Outpatient Medications  Medication Sig Dispense Refill   amoxicillin (AMOXIL) 875 MG tablet Take 1 tablet (875 mg total) by mouth 2 (two) times daily for 10 days. 20 tablet 0   cyanocobalamin (,VITAMIN B-12,) 1000 MCG/ML injection Inject 1,000 mcg into the muscle every 30 (thirty) days.     famotidine (PEPCID) 20 MG tablet TAKE 1 TABLET BY MOUTH EVERYDAY AT BEDTIME 90 tablet 2   fludrocortisone (FLORINEF) 0.1 MG tablet TAKE 1 TABLET(0.1 MG) BY MOUTH TWICE DAILY 180 tablet 2   megestrol (MEGACE ES) 625 MG/5ML suspension Take 5 mLs (625 mg total) by mouth daily. 150 mL 3   metoCLOPramide (REGLAN) 5 MG tablet Take 5 mg by mouth 3 (three) times daily before meals.     midodrine (PROAMATINE) 5 MG tablet Take 1 tablet (5 mg total) by mouth 3 (three) times daily with meals. 270 tablet 3   mirtazapine (REMERON) 15 MG tablet TAKE 1 TABLET BY MOUTH EVERYDAY AT BEDTIME 90 tablet 1   montelukast (SINGULAIR) 10 MG tablet Take 1 tablet (10 mg total) by mouth daily. 90 tablet 3   Multiple Vitamin (MULTIVITAMIN WITH MINERALS) TABS tablet Take 1 tablet by mouth daily.     polyethylene glycol (MIRALAX / GLYCOLAX) 17 g packet Take 17 g by mouth 3 (three) times daily. 14 each 0    RABEprazole (ACIPHEX) 20 MG tablet Take 1 tablet (20 mg total) by mouth daily. 90 tablet 1   sucralfate (CARAFATE) 1 g tablet Take 1 g by mouth 4 (four) times daily.     Vitamin D, Ergocalciferol, (DRISDOL) 1.25 MG (50000 UNIT) CAPS capsule TAKE 1 CAPSULE BY MOUTH EVERY 7 DAYS 12 capsule 3   No current facility-administered medications for this visit.     REVIEW OF SYSTEMS:   Constitutional: Denies fevers, chills or night sweats Eyes: Denies blurriness of vision Ears, nose, mouth, throat, and face: Denies mucositis or sore throat Respiratory: Denies cough, dyspnea or wheezes Cardiovascular: Denies palpitation, chest discomfort or lower extremity swelling Gastrointestinal:  Denies nausea, heartburn or change in bowel habits Skin: Denies abnormal skin rashes Lymphatics: Denies new lymphadenopathy or easy bruising Neurological:Denies numbness, tingling or new weaknesses Behavioral/Psych: Mood is stable, no new changes  All other systems were reviewed with the patient and are negative.  PHYSICAL EXAMINATION: ECOG PERFORMANCE STATUS: 0 - Asymptomatic  Vitals:   06/16/23 1000  BP: 92/72  Pulse: (!) 54  Resp: 18  Temp: 97.8 F (36.6 C)  SpO2: (!) 0%   Filed Weights   06/16/23 1000  Weight: 103 lb 6.4 oz (46.9 kg)    GENERAL:alert, no distress and  comfortable   LABORATORY DATA:  I have reviewed the data as listed     Component Value Date/Time   NA 138 01/27/2023 0900   NA 137 01/15/2019 0000   K 3.3 (L) 01/27/2023 0900   CL 97 01/27/2023 0900   CO2 32 01/27/2023 0900   GLUCOSE 89 01/27/2023 0900   BUN 16 01/27/2023 0900   BUN 15 01/15/2019 0000   CREATININE 0.45 01/27/2023 0900   CALCIUM 9.3 01/27/2023 0900   PROT 6.9 01/13/2023 0926   ALBUMIN 4.0 01/13/2023 0926   AST 13 01/13/2023 0926   ALT 9 01/13/2023 0926   ALKPHOS 61 01/13/2023 0926   BILITOT 0.5 01/13/2023 0926   GFRNONAA >60 09/09/2021 1030   GFRAA >60 05/20/2020 0502    No results found for: "SPEP",  "UPEP"  Lab Results  Component Value Date   WBC 9.2 06/13/2023   NEUTROABS 5.5 06/13/2023   HGB 12.4 06/13/2023   HCT 37.1 06/13/2023   MCV 86.5 06/13/2023   PLT 416 (H) 06/13/2023      Chemistry      Component Value Date/Time   NA 138 01/27/2023 0900   NA 137 01/15/2019 0000   K 3.3 (L) 01/27/2023 0900   CL 97 01/27/2023 0900   CO2 32 01/27/2023 0900   BUN 16 01/27/2023 0900   BUN 15 01/15/2019 0000   CREATININE 0.45 01/27/2023 0900   GLU 91 01/15/2019 0000      Component Value Date/Time   CALCIUM 9.3 01/27/2023 0900   ALKPHOS 61 01/13/2023 0926   AST 13 01/13/2023 0926   ALT 9 01/13/2023 0926   BILITOT 0.5 01/13/2023 0926

## 2023-06-27 ENCOUNTER — Ambulatory Visit (INDEPENDENT_AMBULATORY_CARE_PROVIDER_SITE_OTHER): Payer: 59

## 2023-06-27 DIAGNOSIS — E539 Vitamin B deficiency, unspecified: Secondary | ICD-10-CM | POA: Diagnosis not present

## 2023-06-27 MED ORDER — CYANOCOBALAMIN 1000 MCG/ML IJ SOLN
1000.0000 ug | Freq: Once | INTRAMUSCULAR | Status: AC
  Administered 2023-06-27: 1000 ug via INTRAMUSCULAR

## 2023-06-27 NOTE — Progress Notes (Signed)
Stacy [name] is a 42 y.o. female presents to the office today for Cyanocobalamin injection per physician's orders. Injection was administered Intramuscular Right deltoid.   Patient's next injection due 07/27/2023, appt made? yes  [name]

## 2023-07-25 ENCOUNTER — Other Ambulatory Visit: Payer: Self-pay | Admitting: Family Medicine

## 2023-07-29 ENCOUNTER — Ambulatory Visit (INDEPENDENT_AMBULATORY_CARE_PROVIDER_SITE_OTHER): Payer: 59 | Admitting: Family Medicine

## 2023-07-29 ENCOUNTER — Other Ambulatory Visit: Payer: Self-pay

## 2023-07-29 DIAGNOSIS — E539 Vitamin B deficiency, unspecified: Secondary | ICD-10-CM | POA: Diagnosis not present

## 2023-07-29 MED ORDER — CYANOCOBALAMIN 1000 MCG/ML IJ SOLN
1000.0000 ug | Freq: Once | INTRAMUSCULAR | Status: AC
  Administered 2023-08-30: 1000 ug via INTRAMUSCULAR

## 2023-07-29 MED ORDER — RABEPRAZOLE SODIUM 20 MG PO TBEC: 20.0000 mg | DELAYED_RELEASE_TABLET | Freq: Every day | ORAL | 1 refills | Status: DC

## 2023-07-29 NOTE — Patient Instructions (Signed)
Stacy [name] is a 42 y.o. female presents to the office today for B12 Injection per physician's orders. Injection was administered Intramuscular Right deltoid.   Patient's next injection due 4 wks, appt made? not applicable  Ester Rink

## 2023-07-29 NOTE — Progress Notes (Signed)
B12 given, pt tolerated w/o difficulty

## 2023-08-30 ENCOUNTER — Ambulatory Visit: Payer: 59

## 2023-08-30 DIAGNOSIS — E539 Vitamin B deficiency, unspecified: Secondary | ICD-10-CM | POA: Diagnosis not present

## 2023-08-30 NOTE — Progress Notes (Signed)
 Stacy [name] is a 43 y.o. female presents to the office today for Cyanocobalamin  injection 1,05mcg/mL per physician's orders. Injection was administered Intramuscular Right deltoid.   Patient's next injection due 14 days , appt made? yes  [employer]

## 2023-09-29 ENCOUNTER — Encounter: Payer: Self-pay | Admitting: Family Medicine

## 2023-09-29 ENCOUNTER — Ambulatory Visit: Payer: 59 | Admitting: Family Medicine

## 2023-09-29 VITALS — BP 94/64 | HR 82 | Temp 97.8°F | Wt 100.5 lb

## 2023-09-29 DIAGNOSIS — E539 Vitamin B deficiency, unspecified: Secondary | ICD-10-CM

## 2023-09-29 DIAGNOSIS — J069 Acute upper respiratory infection, unspecified: Secondary | ICD-10-CM | POA: Diagnosis not present

## 2023-09-29 LAB — POC INFLUENZA A&B (BINAX/QUICKVUE)
Influenza A, POC: NEGATIVE
Influenza B, POC: NEGATIVE

## 2023-09-29 LAB — POC COVID19 BINAXNOW: SARS Coronavirus 2 Ag: NEGATIVE

## 2023-09-29 MED ORDER — PROMETHAZINE-DM 6.25-15 MG/5ML PO SYRP: 5.0000 mL | ORAL_SOLUTION | Freq: Four times a day (QID) | ORAL | 0 refills | Status: DC | PRN

## 2023-09-29 MED ORDER — CYANOCOBALAMIN 1000 MCG/ML IJ SOLN
1000.0000 ug | Freq: Once | INTRAMUSCULAR | Status: AC
  Administered 2023-09-29: 1000 ug via INTRAMUSCULAR

## 2023-09-29 NOTE — Progress Notes (Signed)
   Subjective:    Patient ID: Stacy [name], female    DOB: [birth year], 43 y.o.   MRN: [id number]  HPI URI- 'I've got a bad cough'.  Sxs started a few days ago.  + congestion.  Bilateral ear pain.  Cough is wet but not productive.  No fever.  Denies HA.  No body aches but + fatigue.  + sick contacts.   Review of Systems For ROS see HPI     Objective:   Physical Exam Vitals reviewed.  Constitutional:      General: She is not in acute distress.    Appearance: Normal appearance. She is well-developed. She is not ill-appearing.  HENT:     Head: Normocephalic and atraumatic.     Right Ear: Tympanic membrane and ear canal normal.     Left Ear: Tympanic membrane and ear canal normal.     Nose: Congestion present. No rhinorrhea.     Comments: No TTP over frontal or maxillary sinuses Eyes:     Conjunctiva/sclera: Conjunctivae normal.     Pupils: Pupils are equal, round, and reactive to light.  Cardiovascular:     Rate and Rhythm: Normal rate and regular rhythm.     Heart sounds: Normal heart sounds. No murmur heard. Pulmonary:     Effort: Pulmonary effort is normal. No respiratory distress.     Breath sounds: Normal breath sounds. No wheezing or rhonchi.     Comments: + hacking cough Musculoskeletal:     Cervical back: Normal range of motion and neck supple.  Lymphadenopathy:     Cervical: No cervical adenopathy.  Skin:    General: Skin is warm and dry.  Neurological:     General: No focal deficit present.     Mental Status: She is alert and oriented to person, place, and time.  Psychiatric:        Mood and Affect: Mood normal.        Behavior: Behavior normal.        Thought Content: Thought content normal.           Assessment & Plan:  URI- new.  Flu and COVID are negative and no evidence of bacterial infxn on exam.  Suspect viral URI- no abx needed.  Start cough meds prn.  Reviewed supportive care and red flags that should prompt return.  Pt expressed understanding  and is in agreement w/ plan.

## 2023-09-29 NOTE — Patient Instructions (Signed)
Follow up as needed or as scheduled This appears to be viral and should improve w/ time Drink LOTS of fluids REST! Use the cough syrup as needed- may cause drowsiness Use Dayquil or Nyquil for congestion and cough Call with any questions or concerns Hang in there!!!

## 2023-09-30 ENCOUNTER — Ambulatory Visit: Payer: 59

## 2023-10-22 ENCOUNTER — Other Ambulatory Visit: Payer: Self-pay | Admitting: Family Medicine

## 2023-10-29 ENCOUNTER — Encounter (HOSPITAL_COMMUNITY)
Admission: EM | Disposition: A | Discharge status: Home Health | Payer: Self-pay | Source: Home / Self Care | Attending: Family Medicine

## 2023-10-29 ENCOUNTER — Inpatient Hospital Stay (HOSPITAL_COMMUNITY): Admitting: Anesthesiology

## 2023-10-29 ENCOUNTER — Emergency Department (HOSPITAL_COMMUNITY)

## 2023-10-29 ENCOUNTER — Inpatient Hospital Stay (HOSPITAL_COMMUNITY)
Admission: EM | Admit: 2023-10-29 | Discharge: 2023-11-15 | DRG: 853 | Disposition: A | Discharge status: Home Health | Attending: Family Medicine | Admitting: Family Medicine

## 2023-10-29 ENCOUNTER — Other Ambulatory Visit: Payer: Self-pay

## 2023-10-29 ENCOUNTER — Inpatient Hospital Stay (HOSPITAL_COMMUNITY)

## 2023-10-29 DIAGNOSIS — Z681 Body mass index (BMI) 19 or less, adult: Secondary | ICD-10-CM

## 2023-10-29 DIAGNOSIS — Z7952 Long term (current) use of systemic steroids: Secondary | ICD-10-CM

## 2023-10-29 DIAGNOSIS — N926 Irregular menstruation, unspecified: Secondary | ICD-10-CM | POA: Diagnosis present

## 2023-10-29 DIAGNOSIS — R100 Acute abdomen: Secondary | ICD-10-CM

## 2023-10-29 DIAGNOSIS — J90 Pleural effusion, not elsewhere classified: Secondary | ICD-10-CM | POA: Diagnosis not present

## 2023-10-29 DIAGNOSIS — K5720 Diverticulitis of large intestine with perforation and abscess without bleeding: Secondary | ICD-10-CM | POA: Diagnosis present

## 2023-10-29 DIAGNOSIS — E440 Moderate protein-calorie malnutrition: Secondary | ICD-10-CM | POA: Diagnosis present

## 2023-10-29 DIAGNOSIS — R7881 Bacteremia: Secondary | ICD-10-CM | POA: Insufficient documentation

## 2023-10-29 DIAGNOSIS — D72819 Decreased white blood cell count, unspecified: Secondary | ICD-10-CM | POA: Diagnosis present

## 2023-10-29 DIAGNOSIS — K661 Hemoperitoneum: Secondary | ICD-10-CM | POA: Diagnosis present

## 2023-10-29 DIAGNOSIS — Z882 Allergy status to sulfonamides status: Secondary | ICD-10-CM

## 2023-10-29 DIAGNOSIS — Q438 Other specified congenital malformations of intestine: Secondary | ICD-10-CM

## 2023-10-29 DIAGNOSIS — R188 Other ascites: Secondary | ICD-10-CM | POA: Diagnosis present

## 2023-10-29 DIAGNOSIS — K5740 Diverticulitis of both small and large intestine with perforation and abscess without bleeding: Secondary | ICD-10-CM | POA: Diagnosis present

## 2023-10-29 DIAGNOSIS — K631 Perforation of intestine (nontraumatic): Secondary | ICD-10-CM | POA: Diagnosis present

## 2023-10-29 DIAGNOSIS — D509 Iron deficiency anemia, unspecified: Secondary | ICD-10-CM

## 2023-10-29 DIAGNOSIS — I959 Hypotension, unspecified: Secondary | ICD-10-CM

## 2023-10-29 DIAGNOSIS — N939 Abnormal uterine and vaginal bleeding, unspecified: Secondary | ICD-10-CM

## 2023-10-29 DIAGNOSIS — Z79899 Other long term (current) drug therapy: Secondary | ICD-10-CM

## 2023-10-29 DIAGNOSIS — F419 Anxiety disorder, unspecified: Secondary | ICD-10-CM | POA: Diagnosis not present

## 2023-10-29 DIAGNOSIS — R739 Hyperglycemia, unspecified: Secondary | ICD-10-CM | POA: Diagnosis present

## 2023-10-29 DIAGNOSIS — J9811 Atelectasis: Secondary | ICD-10-CM | POA: Diagnosis not present

## 2023-10-29 DIAGNOSIS — K567 Ileus, unspecified: Secondary | ICD-10-CM | POA: Insufficient documentation

## 2023-10-29 DIAGNOSIS — K668 Other specified disorders of peritoneum: Secondary | ICD-10-CM

## 2023-10-29 DIAGNOSIS — E781 Pure hyperglyceridemia: Secondary | ICD-10-CM | POA: Diagnosis present

## 2023-10-29 DIAGNOSIS — E876 Hypokalemia: Secondary | ICD-10-CM

## 2023-10-29 DIAGNOSIS — K219 Gastro-esophageal reflux disease without esophagitis: Secondary | ICD-10-CM | POA: Diagnosis present

## 2023-10-29 DIAGNOSIS — K650 Generalized (acute) peritonitis: Secondary | ICD-10-CM | POA: Diagnosis present

## 2023-10-29 DIAGNOSIS — R339 Retention of urine, unspecified: Secondary | ICD-10-CM | POA: Diagnosis not present

## 2023-10-29 DIAGNOSIS — K5904 Chronic idiopathic constipation: Secondary | ICD-10-CM | POA: Diagnosis present

## 2023-10-29 DIAGNOSIS — R6521 Severe sepsis with septic shock: Secondary | ICD-10-CM | POA: Diagnosis present

## 2023-10-29 DIAGNOSIS — Z888 Allergy status to other drugs, medicaments and biological substances status: Secondary | ICD-10-CM

## 2023-10-29 DIAGNOSIS — D649 Anemia, unspecified: Secondary | ICD-10-CM | POA: Diagnosis present

## 2023-10-29 DIAGNOSIS — K811 Chronic cholecystitis: Secondary | ICD-10-CM | POA: Diagnosis present

## 2023-10-29 DIAGNOSIS — Z833 Family history of diabetes mellitus: Secondary | ICD-10-CM

## 2023-10-29 DIAGNOSIS — E870 Hyperosmolality and hypernatremia: Secondary | ICD-10-CM | POA: Diagnosis not present

## 2023-10-29 DIAGNOSIS — K659 Peritonitis, unspecified: Secondary | ICD-10-CM | POA: Diagnosis not present

## 2023-10-29 DIAGNOSIS — R0681 Apnea, not elsewhere classified: Secondary | ICD-10-CM | POA: Diagnosis not present

## 2023-10-29 DIAGNOSIS — I951 Orthostatic hypotension: Secondary | ICD-10-CM | POA: Diagnosis present

## 2023-10-29 DIAGNOSIS — Z1152 Encounter for screening for COVID-19: Secondary | ICD-10-CM | POA: Diagnosis not present

## 2023-10-29 DIAGNOSIS — A419 Sepsis, unspecified organism: Principal | ICD-10-CM | POA: Diagnosis present

## 2023-10-29 DIAGNOSIS — E8809 Other disorders of plasma-protein metabolism, not elsewhere classified: Secondary | ICD-10-CM | POA: Diagnosis present

## 2023-10-29 DIAGNOSIS — Z8249 Family history of ischemic heart disease and other diseases of the circulatory system: Secondary | ICD-10-CM

## 2023-10-29 DIAGNOSIS — R6520 Severe sepsis without septic shock: Secondary | ICD-10-CM | POA: Diagnosis not present

## 2023-10-29 DIAGNOSIS — K639 Disease of intestine, unspecified: Secondary | ICD-10-CM

## 2023-10-29 DIAGNOSIS — J029 Acute pharyngitis, unspecified: Secondary | ICD-10-CM | POA: Diagnosis not present

## 2023-10-29 DIAGNOSIS — N289 Disorder of kidney and ureter, unspecified: Secondary | ICD-10-CM | POA: Diagnosis present

## 2023-10-29 DIAGNOSIS — Z91018 Allergy to other foods: Secondary | ICD-10-CM

## 2023-10-29 DIAGNOSIS — S31109A Unspecified open wound of abdominal wall, unspecified quadrant without penetration into peritoneal cavity, initial encounter: Secondary | ICD-10-CM | POA: Diagnosis not present

## 2023-10-29 DIAGNOSIS — Z881 Allergy status to other antibiotic agents status: Secondary | ICD-10-CM

## 2023-10-29 DIAGNOSIS — R1084 Generalized abdominal pain: Principal | ICD-10-CM

## 2023-10-29 LAB — CBC WITH DIFFERENTIAL/PLATELET
Abs Immature Granulocytes: 0.02 10*3/uL (ref 0.00–0.07)
Basophils Absolute: 0 10*3/uL (ref 0.0–0.1)
Basophils Relative: 0 %
Eosinophils Absolute: 0 10*3/uL (ref 0.0–0.5)
Eosinophils Relative: 1 %
HCT: 34.1 % — ABNORMAL LOW (ref 36.0–46.0)
Hemoglobin: 10.8 g/dL — ABNORMAL LOW (ref 12.0–15.0)
Immature Granulocytes: 1 %
Lymphocytes Relative: 28 %
Lymphs Abs: 1.1 10*3/uL (ref 0.7–4.0)
MCH: 28.6 pg (ref 26.0–34.0)
MCHC: 31.7 g/dL (ref 30.0–36.0)
MCV: 90.2 fL (ref 80.0–100.0)
Monocytes Absolute: 0 10*3/uL — ABNORMAL LOW (ref 0.1–1.0)
Monocytes Relative: 1 %
Neutro Abs: 2.6 10*3/uL (ref 1.7–7.7)
Neutrophils Relative %: 69 %
Platelets: 477 10*3/uL — ABNORMAL HIGH (ref 150–400)
RBC: 3.78 MIL/uL — ABNORMAL LOW (ref 3.87–5.11)
RDW: 13.9 % (ref 11.5–15.5)
WBC: 3.8 10*3/uL — ABNORMAL LOW (ref 4.0–10.5)
nRBC: 0 % (ref 0.0–0.2)

## 2023-10-29 LAB — COMPREHENSIVE METABOLIC PANEL
ALT: 10 U/L (ref 0–44)
AST: 15 U/L (ref 15–41)
Albumin: 3.4 g/dL — ABNORMAL LOW (ref 3.5–5.0)
Alkaline Phosphatase: 52 U/L (ref 38–126)
Anion gap: 13 (ref 5–15)
BUN: 31 mg/dL — ABNORMAL HIGH (ref 6–20)
CO2: 26 mmol/L (ref 22–32)
Calcium: 8.4 mg/dL — ABNORMAL LOW (ref 8.9–10.3)
Chloride: 100 mmol/L (ref 98–111)
Creatinine, Ser: 0.78 mg/dL (ref 0.44–1.00)
GFR, Estimated: >60 mL/min (ref >60)
Glucose, Bld: 147 mg/dL — ABNORMAL HIGH (ref 70–99)
Potassium: 2 mmol/L — CL (ref 3.5–5.1)
Sodium: 139 mmol/L (ref 135–145)
Total Bilirubin: 1.4 mg/dL — ABNORMAL HIGH (ref 0.0–1.2)
Total Protein: 6.5 g/dL (ref 6.5–8.1)

## 2023-10-29 LAB — I-STAT CHEM 8, ED
BUN: 23 mg/dL — ABNORMAL HIGH (ref 6–20)
Calcium, Ion: 0.96 mmol/L — ABNORMAL LOW (ref 1.15–1.40)
Chloride: 111 mmol/L (ref 98–111)
Creatinine, Ser: 0.7 mg/dL (ref 0.44–1.00)
Glucose, Bld: 87 mg/dL (ref 70–99)
HCT: 21 % — ABNORMAL LOW (ref 36.0–46.0)
Hemoglobin: 7.1 g/dL — ABNORMAL LOW (ref 12.0–15.0)
Potassium: 2.6 mmol/L — CL (ref 3.5–5.1)
Sodium: 144 mmol/L (ref 135–145)
TCO2: 19 mmol/L — ABNORMAL LOW (ref 22–32)

## 2023-10-29 LAB — POCT I-STAT, CHEM 8
BUN: 23 mg/dL — ABNORMAL HIGH (ref 6–20)
Calcium, Ion: 1.41 mmol/L — ABNORMAL HIGH (ref 1.15–1.40)
Chloride: 109 mmol/L (ref 98–111)
Creatinine, Ser: 0.6 mg/dL (ref 0.44–1.00)
Glucose, Bld: 99 mg/dL (ref 70–99)
HCT: 20 % — ABNORMAL LOW (ref 36.0–46.0)
Hemoglobin: 6.8 g/dL — CL (ref 12.0–15.0)
Potassium: 3.1 mmol/L — ABNORMAL LOW (ref 3.5–5.1)
Sodium: 141 mmol/L (ref 135–145)
TCO2: 21 mmol/L — ABNORMAL LOW (ref 22–32)

## 2023-10-29 LAB — BLOOD GAS, ARTERIAL
Acid-base deficit: 1.3 mmol/L (ref 0.0–2.0)
Bicarbonate: 24.8 mmol/L (ref 20.0–28.0)
Drawn by: 270211
FIO2: 100 %
MECHVT: 410 mL
O2 Saturation: 100 %
PEEP: 5 cmH2O
Patient temperature: 36.3
RATE: 18 {breaths}/min
pCO2 arterial: 45 mmHg (ref 32–48)
pH, Arterial: 7.35 (ref 7.35–7.45)
pO2, Arterial: 275 mmHg — ABNORMAL HIGH (ref 83–108)

## 2023-10-29 LAB — POCT I-STAT 7, (LYTES, BLD GAS, ICA,H+H)
Acid-base deficit: 5 mmol/L — ABNORMAL HIGH (ref 0.0–2.0)
Bicarbonate: 20.3 mmol/L (ref 20.0–28.0)
Calcium, Ion: 1.43 mmol/L — ABNORMAL HIGH (ref 1.15–1.40)
HCT: 21 % — ABNORMAL LOW (ref 36.0–46.0)
Hemoglobin: 7.1 g/dL — ABNORMAL LOW (ref 12.0–15.0)
O2 Saturation: 99 %
Patient temperature: 37
Potassium: 3.1 mmol/L — ABNORMAL LOW (ref 3.5–5.1)
Sodium: 141 mmol/L (ref 135–145)
TCO2: 22 mmol/L (ref 22–32)
pCO2 arterial: 40.4 mmHg (ref 32–48)
pH, Arterial: 7.31 — ABNORMAL LOW (ref 7.35–7.45)
pO2, Arterial: 160 mmHg — ABNORMAL HIGH (ref 83–108)

## 2023-10-29 LAB — BASIC METABOLIC PANEL
Anion gap: 9 (ref 5–15)
BUN: 27 mg/dL — ABNORMAL HIGH (ref 6–20)
CO2: 23 mmol/L (ref 22–32)
Calcium: 8.4 mg/dL — ABNORMAL LOW (ref 8.9–10.3)
Chloride: 109 mmol/L (ref 98–111)
Creatinine, Ser: 0.72 mg/dL (ref 0.44–1.00)
GFR, Estimated: >60 mL/min (ref >60)
Glucose, Bld: 115 mg/dL — ABNORMAL HIGH (ref 70–99)
Potassium: 3.5 mmol/L (ref 3.5–5.1)
Sodium: 141 mmol/L (ref 135–145)

## 2023-10-29 LAB — GLUCOSE, CAPILLARY
Glucose-Capillary: 101 mg/dL — ABNORMAL HIGH (ref 70–99)
Glucose-Capillary: 107 mg/dL — ABNORMAL HIGH (ref 70–99)
Glucose-Capillary: 99 mg/dL (ref 70–99)

## 2023-10-29 LAB — PREPARE RBC (CROSSMATCH)

## 2023-10-29 LAB — URINALYSIS, W/ REFLEX TO CULTURE (INFECTION SUSPECTED)
Bilirubin Urine: NEGATIVE
Glucose, UA: NEGATIVE mg/dL
Ketones, ur: NEGATIVE mg/dL
Leukocytes,Ua: NEGATIVE
Nitrite: NEGATIVE
Protein, ur: NEGATIVE mg/dL
Specific Gravity, Urine: 1.035 — ABNORMAL HIGH (ref 1.005–1.030)
pH: 5 (ref 5.0–8.0)

## 2023-10-29 LAB — APTT: aPTT: 30 s (ref 24–36)

## 2023-10-29 LAB — CBC
HCT: 30.7 % — ABNORMAL LOW (ref 36.0–46.0)
Hemoglobin: 9.7 g/dL — ABNORMAL LOW (ref 12.0–15.0)
MCH: 28.4 pg (ref 26.0–34.0)
MCHC: 31.6 g/dL (ref 30.0–36.0)
MCV: 90 fL (ref 80.0–100.0)
Platelets: 270 10*3/uL (ref 150–400)
RBC: 3.41 MIL/uL — ABNORMAL LOW (ref 3.87–5.11)
RDW: 14.5 % (ref 11.5–15.5)
WBC: 4.8 10*3/uL (ref 4.0–10.5)
nRBC: 0 % (ref 0.0–0.2)

## 2023-10-29 LAB — I-STAT CG4 LACTIC ACID, ED
Lactic Acid, Venous: 1.7 mmol/L (ref 0.5–1.9)
Lactic Acid, Venous: 1.6 mmol/L (ref 0.5–1.9)

## 2023-10-29 LAB — RESP PANEL BY RT-PCR (RSV, FLU A&B, COVID)  RVPGX2
Influenza A by PCR: NEGATIVE
Influenza B by PCR: NEGATIVE
Resp Syncytial Virus by PCR: NEGATIVE
SARS Coronavirus 2 by RT PCR: NEGATIVE

## 2023-10-29 LAB — POTASSIUM: Potassium: 2.3 mmol/L — CL (ref 3.5–5.1)

## 2023-10-29 LAB — HCG, SERUM, QUALITATIVE: Preg, Serum: NEGATIVE

## 2023-10-29 LAB — MRSA NEXT GEN BY PCR, NASAL: MRSA by PCR Next Gen: NOT DETECTED

## 2023-10-29 LAB — PROTIME-INR
INR: 1.4 — ABNORMAL HIGH (ref 0.8–1.2)
Prothrombin Time: 17.3 s — ABNORMAL HIGH (ref 11.4–15.2)

## 2023-10-29 LAB — POC OCCULT BLOOD, ED: Fecal Occult Bld: NEGATIVE

## 2023-10-29 LAB — MAGNESIUM: Magnesium: 2.1 mg/dL (ref 1.7–2.4)

## 2023-10-29 SURGERY — LAPAROTOMY, EXPLORATORY
Anesthesia: General | Site: Abdomen

## 2023-10-29 MED ORDER — SODIUM CHLORIDE 0.9 % IV SOLN: INTRAVENOUS | Status: DC | PRN

## 2023-10-29 MED ORDER — ACETAMINOPHEN 10 MG/ML IV SOLN
INTRAVENOUS | Status: AC
  Filled 2023-10-29: qty 100

## 2023-10-29 MED ORDER — SODIUM CHLORIDE 0.9% IV SOLUTION: Freq: Once | INTRAVENOUS | Status: DC

## 2023-10-29 MED ORDER — AMISULPRIDE (ANTIEMETIC) 5 MG/2ML IV SOLN: 10.0000 mg | Freq: Once | INTRAVENOUS | Status: DC | PRN

## 2023-10-29 MED ORDER — SUCCINYLCHOLINE CHLORIDE 200 MG/10ML IV SOSY
PREFILLED_SYRINGE | INTRAVENOUS | Status: AC
  Filled 2023-10-29: qty 10

## 2023-10-29 MED ORDER — SODIUM BICARBONATE 8.4 % IV SOLN
INTRAVENOUS | Status: AC
  Filled 2023-10-29: qty 50

## 2023-10-29 MED ORDER — ALBUMIN HUMAN 5 % IV SOLN
INTRAVENOUS | Status: AC
  Filled 2023-10-29: qty 250

## 2023-10-29 MED ORDER — PHENYLEPHRINE HCL-NACL 20-0.9 MG/250ML-% IV SOLN
INTRAVENOUS | Status: DC | PRN
  Administered 2023-10-29: 25 ug/min via INTRAVENOUS

## 2023-10-29 MED ORDER — PROPOFOL 500 MG/50ML IV EMUL
INTRAVENOUS | Status: DC | PRN
  Administered 2023-10-29: 100 ug/kg/min via INTRAVENOUS

## 2023-10-29 MED ORDER — DEXAMETHASONE SODIUM PHOSPHATE 10 MG/ML IJ SOLN
INTRAMUSCULAR | Status: AC
  Filled 2023-10-29: qty 1

## 2023-10-29 MED ORDER — ORAL CARE MOUTH RINSE
15.0000 mL | OROMUCOSAL | Status: DC
Start: 2023-10-29 — End: 2023-10-31
  Administered 2023-10-29 – 2023-10-31 (×23): 15 mL via OROMUCOSAL

## 2023-10-29 MED ORDER — NOREPINEPHRINE 4 MG/250ML-% IV SOLN
2.0000 ug/min | INTRAVENOUS | Status: DC
  Filled 2023-10-29: qty 250

## 2023-10-29 MED ORDER — FENTANYL CITRATE (PF) 100 MCG/2ML IJ SOLN
INTRAMUSCULAR | Status: AC
  Filled 2023-10-29: qty 2

## 2023-10-29 MED ORDER — LIDOCAINE HCL (PF) 2 % IJ SOLN
INTRAMUSCULAR | Status: AC
  Filled 2023-10-29: qty 5

## 2023-10-29 MED ORDER — LIDOCAINE HCL (PF) 2 % IJ SOLN
INTRAMUSCULAR | Status: DC | PRN
  Administered 2023-10-29: 40 mg via INTRADERMAL

## 2023-10-29 MED ORDER — HYDROCORTISONE SOD SUC (PF) 100 MG IJ SOLR
100.0000 mg | Freq: Once | INTRAMUSCULAR | Status: AC
  Administered 2023-10-29: 100 mg via INTRAVENOUS
  Filled 2023-10-29: qty 2

## 2023-10-29 MED ORDER — PROPOFOL 10 MG/ML IV BOLUS
INTRAVENOUS | Status: DC | PRN
Start: 2023-10-29 — End: 2023-10-29
  Administered 2023-10-29: 100 mg via INTRAVENOUS

## 2023-10-29 MED ORDER — LACTATED RINGERS IV SOLN: INTRAVENOUS | Status: AC

## 2023-10-29 MED ORDER — FENTANYL CITRATE PF 50 MCG/ML IJ SOSY
50.0000 ug | PREFILLED_SYRINGE | Freq: Once | INTRAMUSCULAR | Status: AC
  Administered 2023-10-29: 50 ug via INTRAVENOUS
  Filled 2023-10-29: qty 1

## 2023-10-29 MED ORDER — ONDANSETRON HCL 4 MG/2ML IJ SOLN
INTRAMUSCULAR | Status: AC
  Filled 2023-10-29: qty 2

## 2023-10-29 MED ORDER — FENTANYL BOLUS VIA INFUSION
50.0000 ug | INTRAVENOUS | Status: DC | PRN
  Administered 2023-10-29 – 2023-10-30 (×2): 50 ug via INTRAVENOUS
  Administered 2023-10-30 (×3): 75 ug via INTRAVENOUS
  Administered 2023-10-30: 50 ug via INTRAVENOUS
  Administered 2023-10-30: 75 ug via INTRAVENOUS
  Administered 2023-10-31: 50 ug via INTRAVENOUS
  Administered 2023-10-31: 10 ug via INTRAVENOUS
  Filled 2023-10-29: qty 100

## 2023-10-29 MED ORDER — FENTANYL CITRATE PF 50 MCG/ML IJ SOSY: 25.0000 ug | PREFILLED_SYRINGE | INTRAMUSCULAR | Status: DC | PRN

## 2023-10-29 MED ORDER — PIPERACILLIN-TAZOBACTAM 3.375 G IVPB 30 MIN
3.3750 g | INTRAVENOUS | Status: AC
  Administered 2023-10-29: 3.375 g via INTRAVENOUS
  Filled 2023-10-29: qty 50

## 2023-10-29 MED ORDER — SODIUM CHLORIDE 0.9 % IV SOLN
250.0000 mL | INTRAVENOUS | Status: AC
  Administered 2023-10-29: 250 mL via INTRAVENOUS

## 2023-10-29 MED ORDER — FENTANYL CITRATE PF 50 MCG/ML IJ SOSY: 50.0000 ug | PREFILLED_SYRINGE | Freq: Once | INTRAMUSCULAR | Status: DC

## 2023-10-29 MED ORDER — FENTANYL 2500MCG IN NS 250ML (10MCG/ML) PREMIX INFUSION
50.0000 ug/h | INTRAVENOUS | Status: DC
  Administered 2023-10-29: 50 ug/h via INTRAVENOUS
  Administered 2023-10-30: 150 ug/h via INTRAVENOUS
  Administered 2023-10-31: 200 ug/h via INTRAVENOUS
  Filled 2023-10-29 (×4): qty 250

## 2023-10-29 MED ORDER — IOHEXOL 300 MG/ML  SOLN
100.0000 mL | Freq: Once | INTRAMUSCULAR | Status: AC | PRN
  Administered 2023-10-29: 100 mL via INTRAVENOUS

## 2023-10-29 MED ORDER — ALBUMIN HUMAN 5 % IV SOLN: INTRAVENOUS | Status: DC | PRN

## 2023-10-29 MED ORDER — LACTATED RINGERS IV SOLN: INTRAVENOUS | Status: DC | PRN

## 2023-10-29 MED ORDER — PROPOFOL 1000 MG/100ML IV EMUL
0.0000 ug/kg/min | INTRAVENOUS | Status: DC
Start: 2023-10-29 — End: 2023-10-31
  Administered 2023-10-29 – 2023-10-31 (×8): 50 ug/kg/min via INTRAVENOUS
  Filled 2023-10-29 (×7): qty 100

## 2023-10-29 MED ORDER — OXYCODONE HCL 5 MG PO TABS: 5.0000 mg | ORAL_TABLET | Freq: Once | ORAL | Status: DC | PRN

## 2023-10-29 MED ORDER — PIPERACILLIN-TAZOBACTAM 3.375 G IVPB
3.3750 g | Freq: Three times a day (TID) | INTRAVENOUS | Status: DC
  Administered 2023-10-29 – 2023-11-07 (×27): 3.375 g via INTRAVENOUS
  Filled 2023-10-29 (×26): qty 50

## 2023-10-29 MED ORDER — ROCURONIUM BROMIDE 10 MG/ML (PF) SYRINGE
PREFILLED_SYRINGE | INTRAVENOUS | Status: DC | PRN
  Administered 2023-10-29 (×2): 50 mg via INTRAVENOUS

## 2023-10-29 MED ORDER — POTASSIUM CHLORIDE 10 MEQ/100ML IV SOLN
10.0000 meq | INTRAVENOUS | Status: AC
  Administered 2023-10-29 (×4): 10 meq via INTRAVENOUS
  Filled 2023-10-29 (×4): qty 100

## 2023-10-29 MED ORDER — CHLORHEXIDINE GLUCONATE CLOTH 2 % EX PADS
6.0000 | MEDICATED_PAD | Freq: Every day | CUTANEOUS | Status: DC
  Administered 2023-10-29 – 2023-11-15 (×18): 6 via TOPICAL

## 2023-10-29 MED ORDER — POTASSIUM CHLORIDE 10 MEQ/100ML IV SOLN
10.0000 meq | INTRAVENOUS | Status: DC
  Administered 2023-10-29 (×3): 10 meq via INTRAVENOUS
  Filled 2023-10-29 (×3): qty 100

## 2023-10-29 MED ORDER — ACETAMINOPHEN 10 MG/ML IV SOLN
INTRAVENOUS | Status: DC | PRN
  Administered 2023-10-29: 1000 mg via INTRAVENOUS

## 2023-10-29 MED ORDER — POTASSIUM CHLORIDE 10 MEQ/100ML IV SOLN
10.0000 meq | INTRAVENOUS | Status: AC
  Administered 2023-10-29 (×3): 10 meq via INTRAVENOUS
  Filled 2023-10-29 (×3): qty 100

## 2023-10-29 MED ORDER — TRIMETHOBENZAMIDE HCL 100 MG/ML IM SOLN
200.0000 mg | INTRAMUSCULAR | Status: AC
  Administered 2023-10-29: 200 mg via INTRAMUSCULAR
  Filled 2023-10-29: qty 2

## 2023-10-29 MED ORDER — METRONIDAZOLE 500 MG/100ML IV SOLN
500.0000 mg | Freq: Once | INTRAVENOUS | Status: AC
  Administered 2023-10-29: 500 mg via INTRAVENOUS
  Filled 2023-10-29: qty 100

## 2023-10-29 MED ORDER — PROPOFOL 10 MG/ML IV BOLUS
INTRAVENOUS | Status: AC
  Filled 2023-10-29: qty 20

## 2023-10-29 MED ORDER — LACTATED RINGERS IV BOLUS (SEPSIS)
1000.0000 mL | Freq: Once | INTRAVENOUS | Status: AC
  Administered 2023-10-29: 1000 mL via INTRAVENOUS

## 2023-10-29 MED ORDER — CALCIUM CHLORIDE 10 % IV SOLN
INTRAVENOUS | Status: AC
  Filled 2023-10-29: qty 10

## 2023-10-29 MED ORDER — PROPOFOL 1000 MG/100ML IV EMUL
INTRAVENOUS | Status: AC
Start: 2023-10-29 — End: ?
  Filled 2023-10-29: qty 100

## 2023-10-29 MED ORDER — ORAL CARE MOUTH RINSE: 15.0000 mL | OROMUCOSAL | Status: DC | PRN

## 2023-10-29 MED ORDER — PANTOPRAZOLE SODIUM 40 MG IV SOLR
40.0000 mg | Freq: Every day | INTRAVENOUS | Status: DC
  Administered 2023-10-29 – 2023-11-15 (×18): 40 mg via INTRAVENOUS
  Filled 2023-10-29 (×18): qty 10

## 2023-10-29 MED ORDER — PHENYLEPHRINE 80 MCG/ML (10ML) SYRINGE FOR IV PUSH (FOR BLOOD PRESSURE SUPPORT)
PREFILLED_SYRINGE | INTRAVENOUS | Status: DC | PRN
Start: 2023-10-29 — End: 2023-10-29
  Administered 2023-10-29: 160 ug via INTRAVENOUS
  Administered 2023-10-29 (×2): 80 ug via INTRAVENOUS
  Administered 2023-10-29: 160 ug via INTRAVENOUS

## 2023-10-29 MED ORDER — DEXAMETHASONE SODIUM PHOSPHATE 10 MG/ML IJ SOLN
INTRAMUSCULAR | Status: DC | PRN
  Administered 2023-10-29: 8 mg via INTRAVENOUS

## 2023-10-29 MED ORDER — 0.9 % SODIUM CHLORIDE (POUR BTL) OPTIME
TOPICAL | Status: DC | PRN
  Administered 2023-10-29: 5000 mL

## 2023-10-29 MED ORDER — MIDAZOLAM HCL 2 MG/2ML IJ SOLN
INTRAMUSCULAR | Status: AC
  Filled 2023-10-29: qty 2

## 2023-10-29 MED ORDER — DOCUSATE SODIUM 100 MG PO CAPS: 100.0000 mg | ORAL_CAPSULE | Freq: Two times a day (BID) | ORAL | Status: DC | PRN

## 2023-10-29 MED ORDER — MAGNESIUM SULFATE 2 GM/50ML IV SOLN
2.0000 g | Freq: Once | INTRAVENOUS | Status: AC
  Administered 2023-10-29: 2 g via INTRAVENOUS
  Filled 2023-10-29: qty 50

## 2023-10-29 MED ORDER — CALCIUM GLUCONATE-NACL 1-0.675 GM/50ML-% IV SOLN
1.0000 g | Freq: Once | INTRAVENOUS | Status: AC
  Administered 2023-10-29: 1000 mg via INTRAVENOUS
  Filled 2023-10-29: qty 50

## 2023-10-29 MED ORDER — OXYCODONE HCL 5 MG/5ML PO SOLN: 5.0000 mg | Freq: Once | ORAL | Status: DC | PRN

## 2023-10-29 MED ORDER — ROCURONIUM BROMIDE 10 MG/ML (PF) SYRINGE
PREFILLED_SYRINGE | INTRAVENOUS | Status: AC
  Filled 2023-10-29: qty 10

## 2023-10-29 MED ORDER — POLYETHYLENE GLYCOL 3350 17 G PO PACK: 17.0000 g | PACK | Freq: Every day | ORAL | Status: DC | PRN

## 2023-10-29 MED ORDER — SODIUM CHLORIDE 0.9 % IV SOLN
2.0000 g | Freq: Once | INTRAVENOUS | Status: AC
  Administered 2023-10-29: 2 g via INTRAVENOUS
  Filled 2023-10-29: qty 12.5

## 2023-10-29 MED ORDER — SODIUM CHLORIDE 0.9 % IV BOLUS
1000.0000 mL | Freq: Once | INTRAVENOUS | Status: AC
  Administered 2023-10-29: 1000 mL via INTRAVENOUS

## 2023-10-29 MED ORDER — KETOROLAC TROMETHAMINE 15 MG/ML IJ SOLN
15.0000 mg | Freq: Once | INTRAMUSCULAR | Status: AC
  Administered 2023-10-29: 15 mg via INTRAVENOUS
  Filled 2023-10-29: qty 1

## 2023-10-29 MED ORDER — POTASSIUM CHLORIDE CRYS ER 20 MEQ PO TBCR
60.0000 meq | EXTENDED_RELEASE_TABLET | Freq: Once | ORAL | Status: AC
Start: 2023-10-29 — End: 2023-10-29
  Administered 2023-10-29: 60 meq via ORAL
  Filled 2023-10-29: qty 3

## 2023-10-29 MED ORDER — PHENYLEPHRINE 80 MCG/ML (10ML) SYRINGE FOR IV PUSH (FOR BLOOD PRESSURE SUPPORT)
PREFILLED_SYRINGE | INTRAVENOUS | Status: AC
  Filled 2023-10-29: qty 10

## 2023-10-29 MED ORDER — FAMOTIDINE 20 MG PO TABS
20.0000 mg | ORAL_TABLET | Freq: Two times a day (BID) | ORAL | Status: DC
  Administered 2023-10-29: 20 mg
  Filled 2023-10-29 (×2): qty 1

## 2023-10-29 MED ORDER — SUCCINYLCHOLINE CHLORIDE 200 MG/10ML IV SOSY
PREFILLED_SYRINGE | INTRAVENOUS | Status: DC | PRN
  Administered 2023-10-29: 60 mg via INTRAVENOUS

## 2023-10-29 MED ORDER — FENTANYL CITRATE PF 50 MCG/ML IJ SOSY
25.0000 ug | PREFILLED_SYRINGE | Freq: Once | INTRAMUSCULAR | Status: AC
  Administered 2023-10-29: 25 ug via INTRAVENOUS
  Filled 2023-10-29: qty 1

## 2023-10-29 MED ORDER — SODIUM BICARBONATE 8.4 % IV SOLN
INTRAVENOUS | Status: DC | PRN
Start: 2023-10-29 — End: 2023-10-29
  Administered 2023-10-29: 50 meq via INTRAVENOUS

## 2023-10-29 MED ORDER — ACETAMINOPHEN 10 MG/ML IV SOLN: 1000.0000 mg | Freq: Once | INTRAVENOUS | Status: DC | PRN

## 2023-10-29 MED ORDER — MIDAZOLAM HCL 2 MG/2ML IJ SOLN
INTRAMUSCULAR | Status: DC | PRN
  Administered 2023-10-29: 2 mg via INTRAVENOUS

## 2023-10-29 MED ORDER — CALCIUM CHLORIDE 10 % IV SOLN
INTRAVENOUS | Status: DC | PRN
  Administered 2023-10-29: 1 g via INTRAVENOUS

## 2023-10-29 MED ORDER — POTASSIUM CHLORIDE 10 MEQ/100ML IV SOLN
10.0000 meq | INTRAVENOUS | Status: DC
  Administered 2023-10-29: 10 meq via INTRAVENOUS
  Filled 2023-10-29: qty 100

## 2023-10-29 MED ORDER — HYDROMORPHONE HCL 1 MG/ML IJ SOLN
1.0000 mg | Freq: Once | INTRAMUSCULAR | Status: AC
  Administered 2023-10-29: 1 mg via INTRAVENOUS
  Filled 2023-10-29: qty 1

## 2023-10-29 MED ORDER — FENTANYL CITRATE (PF) 100 MCG/2ML IJ SOLN
INTRAMUSCULAR | Status: DC | PRN
Start: 2023-10-29 — End: 2023-10-29
  Administered 2023-10-29: 100 ug via INTRAVENOUS

## 2023-10-29 MED ORDER — PHENYLEPHRINE HCL-NACL 20-0.9 MG/250ML-% IV SOLN
0.0000 ug/min | INTRAVENOUS | Status: DC
  Administered 2023-10-29: 20 ug/min via INTRAVENOUS
  Administered 2023-10-30: 40 ug/min via INTRAVENOUS
  Administered 2023-10-30: 20 ug/min via INTRAVENOUS
  Administered 2023-10-31: 40 ug/min via INTRAVENOUS
  Administered 2023-10-31: 20 ug/min via INTRAVENOUS
  Filled 2023-10-29 (×4): qty 250

## 2023-10-29 SURGICAL SUPPLY — 38 items
BAG COUNTER SPONGE SURGICOUNT (BAG) IMPLANT
CANISTER WOUNDNEG PRESSURE 500 (CANNISTER) IMPLANT
CHLORAPREP W/TINT 26 (MISCELLANEOUS) IMPLANT
COVER MAYO STAND STRL (DRAPES) ×1 IMPLANT
COVER SURGICAL LIGHT HANDLE (MISCELLANEOUS) ×1 IMPLANT
DRAIN CHANNEL 19F RND (DRAIN) IMPLANT
DRAPE LAPAROSCOPIC ABDOMINAL (DRAPES) ×1 IMPLANT
DRSG OPSITE POSTOP 4X10 (GAUZE/BANDAGES/DRESSINGS) IMPLANT
DRSG OPSITE POSTOP 4X6 (GAUZE/BANDAGES/DRESSINGS) IMPLANT
DRSG OPSITE POSTOP 4X8 (GAUZE/BANDAGES/DRESSINGS) IMPLANT
ELECT REM PT RETURN 15FT ADLT (MISCELLANEOUS) ×1 IMPLANT
EVACUATOR SILICONE 100CC (DRAIN) IMPLANT
GLOVE BIO SURGEON STRL SZ 6 (GLOVE) ×2 IMPLANT
GLOVE INDICATOR 6.5 STRL GRN (GLOVE) ×2 IMPLANT
GOWN STRL REUS W/ TWL LRG LVL3 (GOWN DISPOSABLE) ×1 IMPLANT
GOWN STRL REUS W/ TWL XL LVL3 (GOWN DISPOSABLE) IMPLANT
KIT BASIN OR (CUSTOM PROCEDURE TRAY) ×1 IMPLANT
KIT TURNOVER KIT A (KITS) IMPLANT
PACK GENERAL/GYN (CUSTOM PROCEDURE TRAY) ×1 IMPLANT
RELOAD PROXIMATE 75MM BLUE (ENDOMECHANICALS) ×2 IMPLANT
RELOAD STAPLE 75 3.8 BLU REG (ENDOMECHANICALS) IMPLANT
RETRACTOR WOUND ALXS 34CM XLRG (MISCELLANEOUS) IMPLANT
RTRCTR WOUND ALEXIS 34CM XLRG (MISCELLANEOUS) ×1 IMPLANT
SPONGE ABD ABTHERA ADVANCE (MISCELLANEOUS) IMPLANT
SPONGE T-LAP 18X18 ~~LOC~~+RFID (SPONGE) IMPLANT
STAPLER PROXIMATE 75MM BLUE (STAPLE) IMPLANT
STAPLER SKIN PROX WIDE 3.9 (STAPLE) IMPLANT
SUT ETHILON 2 0 PS N (SUTURE) IMPLANT
SUT MNCRL AB 4-0 PS2 18 (SUTURE) IMPLANT
SUT NOVA T20/GS 25 (SUTURE) IMPLANT
SUT SILK 2 0 SH CR/8 (SUTURE) ×1 IMPLANT
SUT SILK 2-0 18XBRD TIE 12 (SUTURE) ×1 IMPLANT
SUT SILK 3 0 SH CR/8 (SUTURE) ×1 IMPLANT
SUT SILK 3-0 18XBRD TIE 12 (SUTURE) ×1 IMPLANT
TOWEL GREEN STERILE FF (TOWEL DISPOSABLE) ×1 IMPLANT
TOWEL OR 17X26 10 PK STRL BLUE (TOWEL DISPOSABLE) ×1 IMPLANT
TRAY FOLEY MTR SLVR 14FR STAT (SET/KITS/TRAYS/PACK) ×1 IMPLANT
TRAY FOLEY MTR SLVR 16FR STAT (SET/KITS/TRAYS/PACK) ×1 IMPLANT

## 2023-10-29 NOTE — Op Note (Signed)
 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 on the right side just lateral to the duodenum, diffuse and severe purulent peritonitis, extremely distended but not inflamed appearing gallbladder  Infection present at time of surgery: Yes, patient had diffuse purulent peritonitis, copious pus in the abdomen, perforated viscus, sepsis all prior to surgery   Specimens: Extended right hemicolectomy Retained items: ABThera wound VAC EBL: 30cc Complications: none   Description of procedure: After confirming informed consent the patient was taken to the operating room and placed supine on operating room table where general endotracheal anesthesia was initiated, preoperative antibiotics were administered, SCDs applied, and a formal timeout was performed.  A Foley catheter was inserted under sterile conditions as well as an arterial line and an NG tube.  The abdomen was prepped and draped in the usual sterile fashion.  A midline laparotomy was created and peritoneal entry revealed air and copious pus with small amounts of particulate matter.  The laparotomy was extended and an Alexis wound protector was placed.  Pus was evacuated and the bowel inspected.  There was diffuse severe serositis along the entirety of the small bowel with patchy ischemic changes and palpable bowel wall edema diffusely.  I identified the ligament of Treitz and ran the bowel distally to the ileocecal valve.  There was no small bowel perforation or frank necrosis, several areas of purulent exudate were gently wiped away.  The pelvis was inspected.  The  uterus and adnexa appeared normal with no overt infectious pathology.  Additional pus was evacuated from the cul-de-sac and the rectum was identified.  This was palpably soft and was free of any perforation or abnormality.  The sigmoid and descending colon were inspected closely, along with the splenic flexure and distal transverse colon.  These all appear to be free of perforation or palpable abnormality.  Along the mid transverse colon adjacent to the inferior aspect of the mesenteric border was an approximately 1.5 cm area of wall thickening and erosion with a very small central full-thickness defect noted consistent with the expected perforation.  I followed the colon proximally all the way around to the ileocecal valve again; the remainder of the colon appeared to be intact however there was a firm mass along the proximal transverse colon mesentery which tethered the mesentery down to the retroperitoneum lateral to the duodenum.  The stomach was inspected and confirmed to be within normal limits.  The NG tube was palpated in appropriate position in the stomach.  The pylorus and duodenal bulb were visualized and appeared healthy.  At this point I mobilized the lateral attachments of the right colon as well as the hepatic flexure and proximal transverse colon, carefully protecting the underlying duodenum.  I was able to visualize the second and third portion of the duodenum and these appeared completely healthy and free of any abnormality.  The area of mesenteric mass near the hepatic flexure/proximal transverse colon was able to be carefully dissected away from the retroperitoneum, leaving a firm, white area on the retroperitoneum which may reflect residual tumor if this is ultimately demonstrated to be malignant.  Again this is just a couple centimeters lateral to the second portion of the duodenum.  I elected to complete an extended right hemicolectomy to include the mesenteric abnormality here as well as the  perforation in the central transverse colon.  The bowel was divided just distal to the perforation as well as about 10 cm from the ileocecal valve using 75 mm Endo GIA blue load staplers.  The intervening mesentery was divided with the LigaSure were, with bleeding points oversewn with 2-0 silk.  There was a approximately 1 cm lymph node along the mid transverse colon mesentery proximally towards the larger vessels which I did not resect at this time.  Once the specimen had been freed, this was handed off and the abdominal cavity was inspected, hemostasis was ensured.  The abdomen was irrigated with 4 L of warm sterile saline; the effluent was clear.  All appreciable pus had been evacuated.  The small bowel was run once more and confirmed to be free of injury.  The patchy ischemic changes did seem to have resolved but there was ongoing severe serositis and edema.  Given her overall condition and the condition of her bowel, I elected to leave the bowel in discontinuity for a second look in 24 to 48 hours.  The ABThera wound VAC system was placed after trimming the internal drape slightly.  Suction was applied with excellent seal. The patient was then transported to the ICU, intubated and in critical condition.    All counts were correct at the completion of the case.

## 2023-10-29 NOTE — Interval H&P Note (Signed)
 History and Physical Interval Note:  10/29/2023 12:32 PM  Stacy [name]  has presented today for surgery, with the diagnosis of FREE AIR.  The various methods of treatment have been discussed with the patient and family. After consideration of risks, benefits and other options for treatment, the patient has consented to  Procedure(s): LAPAROTOMY, EXPLORATORY (N/A) as a surgical intervention.  The patient's history has been reviewed, patient examined, no change in status, stable for surgery.  I have reviewed the patient's chart and labs.  Questions were answered to the patient's satisfaction.     [place] Lollie Sails

## 2023-10-29 NOTE — Anesthesia Procedure Notes (Signed)
 Procedure Name: Intubation Date/Time: 10/29/2023 2:34 PM  Performed by: Oletha Cruel, CRNAPre-anesthesia Checklist: Patient identified, Emergency Drugs available, Suction available and Patient being monitored Patient Re-evaluated:Patient Re-evaluated prior to induction Oxygen Delivery Method: Circle system utilized Preoxygenation: Pre-oxygenation with 100% oxygen Induction Type: IV induction Laryngoscope Size: Mac and 4 Grade View: Grade II Tube type: Oral Tube size: 7.5 mm Number of attempts: 1 Airway Equipment and Method: Stylet Placement Confirmation: ETT inserted through vocal cords under direct vision, positive ETCO2, CO2 detector and breath sounds checked- equal and bilateral Secured at: 22 cm Tube secured with: Tape Dental Injury: Teeth and Oropharynx as per pre-operative assessment  Comments: Atraumatic intubation. Lips and teeth remain in preoperative condition.

## 2023-10-29 NOTE — Progress Notes (Addendum)
 Arterial line placed in OR- RT labeled. Currently, arterial line has good waveform, draws and flushes blood, insertion site WNL.  Placed ETT holder on PT- tube currently resides at 22cm lip.

## 2023-10-29 NOTE — Anesthesia Preprocedure Evaluation (Addendum)
 Anesthesia Evaluation  Patient identified by MRN, date of birth, ID band Patient awake    Reviewed: Allergy & Precautions, NPO status , Patient's Chart, lab work & pertinent test results  Airway Mallampati: II  TM Distance: >3 FB Neck ROM: Full    Dental no notable dental hx.    Pulmonary neg pulmonary ROS   Pulmonary exam normal        Cardiovascular negative cardio ROS  Rhythm:Regular Rate:Tachycardia     Neuro/Psych   Anxiety     negative neurological ROS     GI/Hepatic Neg liver ROS,GERD  Medicated and Controlled,,Chronic intestinal pseudo-obstruction   Endo/Other  Adrenal insufficiency hypokalemia  Renal/GU negative Renal ROS     Musculoskeletal negative musculoskeletal ROS (+)    Abdominal   Peds  Hematology  (+) Blood dyscrasia, anemia INR: 1.4   Anesthesia Other Findings FREE AIR  Reproductive/Obstetrics Hcg negative                             Anesthesia Physical Anesthesia Plan  ASA: 3 and emergent  Anesthesia Plan: General   Post-op Pain Management:    Induction: Intravenous  PONV Risk Score and Plan: 4 or greater and Ondansetron, Dexamethasone, Midazolam, Scopolamine patch - Pre-op and Treatment may vary due to age or medical condition  Airway Management Planned: Oral ETT  Additional Equipment: Arterial line  Intra-op Plan:   Post-operative Plan: Possible Post-op intubation/ventilation  Informed Consent: I have reviewed the patients History and Physical, chart, labs and discussed the procedure including the risks, benefits and alternatives for the proposed anesthesia with the patient or authorized representative who has indicated his/her understanding and acceptance.     Dental advisory given  Plan Discussed with: CRNA and Surgeon  Anesthesia Plan Comments: (Potential central line discussed Potential arterial line discussed )       Anesthesia Quick  Evaluation

## 2023-10-29 NOTE — Sepsis Progress Note (Signed)
 Elink monitoring for the code sepsis protocol.

## 2023-10-29 NOTE — Anesthesia Procedure Notes (Signed)
 Arterial Line Insertion Start/End3/15/2025 2:35 PM, 10/29/2023 2:45 PM Performed by: Leonides Grills, MD, anesthesiologist  Patient location: OR. Preanesthetic checklist: patient identified, IV checked, site marked, risks and benefits discussed, surgical consent, monitors and equipment checked, pre-op evaluation, timeout performed and anesthesia consent Left, radial was placed Catheter size: 20 G Hand hygiene performed , maximum sterile barriers used  and Seldinger technique used  Attempts: 1 Procedure performed using ultrasound guided technique. Ultrasound Notes:anatomy identified, needle tip was noted to be adjacent to the nerve/plexus identified and no ultrasound evidence of intravascular and/or intraneural injection Following insertion, dressing applied and Biopatch. Post procedure assessment: normal and unchanged  Patient tolerated the procedure well with no immediate complications.

## 2023-10-29 NOTE — Plan of Care (Signed)
   Problem: Education: Goal: Knowledge of General Education information will improve Description Including pain rating scale, medication(s)/side effects and non-pharmacologic comfort measures Outcome: Progressing   Problem: Health Behavior/Discharge Planning: Goal: Ability to manage health-related needs will improve Outcome: Progressing

## 2023-10-29 NOTE — ED Provider Notes (Signed)
 [place] EMERGENCY DEPARTMENT AT [facility] Provider Note   CSN: [id number] Arrival date & time: 10/29/23  0247     History  Chief Complaint  Patient presents with   Abdominal Pain    Stacy [name] is a 43 y.o. female.  The history is provided by the patient and the EMS personnel.  Abdominal Pain Pain location:  Generalized Pain quality: sharp   Pain radiates to:  Does not radiate Pain severity:  Severe Onset quality:  Sudden Timing:  Constant Progression:  Unchanged Chronicity:  Recurrent Context: not laxative use   Relieved by:  Nothing Worsened by:  Nothing Ineffective treatments:  None tried Associated symptoms: diarrhea, nausea, vaginal bleeding and vomiting   Associated symptoms: no fever   Risk factors: has not had multiple surgeries and not pregnant   Patient with a h/o of failure to thrive, nausea and vomiting and pseudo-obstruction presents with sudden onset abdominal pain and n/v/d.  No fevers.  No urinary symptoms.      Past Medical History:  Diagnosis Date   Anxiety    Benign liver cyst    Chronic intestinal pseudo-obstruction    GERD (gastroesophageal reflux disease)    History of UTI    Nausea and vomiting 04/28/2020     Home Medications Prior to Admission medications   Medication Sig Start Date End Date Taking? Authorizing Provider  cyanocobalamin (,VITAMIN B-12,) 1000 MCG/ML injection Inject 1,000 mcg into the muscle every 30 (thirty) days.    [provider]  famotidine (PEPCID) 20 MG tablet TAKE 1 TABLET BY MOUTH EVERYDAY AT BEDTIME 08/01/20   [name], MD  fludrocortisone (FLORINEF) 0.1 MG tablet TAKE 1 TABLET(0.1 MG) BY MOUTH TWICE DAILY 07/25/23   [name], MD  megestrol (MEGACE ES) 625 MG/5ML suspension SHAKE LIQUID AND TAKE 5 ML(625 MG) BY MOUTH DAILY 10/24/23   [name], MD  metoCLOPramide (REGLAN) 5 MG tablet Take 5 mg by mouth 3 (three) times daily before meals.    [provider]  midodrine (PROAMATINE) 5 MG tablet Take 1 tablet (5 mg total) by mouth 3 (three) times daily with meals. 03/17/23   Chilton Si, MD  mirtazapine (REMERON) 15 MG tablet TAKE 1 TABLET BY MOUTH EVERYDAY AT BEDTIME 04/23/21   [name], MD  montelukast (SINGULAIR) 10 MG tablet Take 1 tablet (10 mg total) by mouth daily. 06/01/23   [name], MD  Multiple Vitamin (MULTIVITAMIN WITH MINERALS) TABS tablet Take 1 tablet by mouth daily. 05/29/20   Rodolph Bong, MD  polyethylene glycol (MIRALAX / GLYCOLAX) 17 g packet Take 17 g by mouth 3 (three) times daily. 05/28/20   Rodolph Bong, MD  promethazine-dextromethorphan (PROMETHAZINE-DM) 6.25-15 MG/5ML syrup Take 5 mLs by mouth 4 (four) times daily as needed. 09/29/23   [name], MD  RABEprazole (ACIPHEX) 20 MG tablet Take 1 tablet (20 mg total) by mouth daily. 07/29/23 09/28/25  [name], MD  sucralfate (CARAFATE) 1 g tablet Take 1 g by mouth 4 (four) times daily. 12/30/20   [provider]  Vitamin D, Ergocalciferol, (DRISDOL) 1.25 MG (50000 UNIT) CAPS capsule TAKE 1 CAPSULE BY MOUTH EVERY 7 DAYS 03/02/23   [name], MD      Allergies    Tetracyclines & related, Erythromycin, Raspberry, Sulfa antibiotics, and Sulfonamide derivatives    Review of Systems   Review of Systems  Constitutional:  Negative for fever.  Gastrointestinal:  Positive for  abdominal pain, diarrhea, nausea and vomiting.  Genitourinary:  Positive for vaginal bleeding.       Vaginal bleeding started in the ED  All other systems reviewed and are negative.   Physical Exam Updated Vital Signs BP 101/69   Pulse (!) 102   Temp 98.3 F (36.8 C)   Resp 16   SpO2 98%  Physical Exam Vitals and nursing note reviewed.  Constitutional:      General: She is not in acute distress.    Appearance: She is well-developed.  HENT:     Head: Normocephalic and atraumatic.     Nose: Nose normal.  Eyes:      Pupils: Pupils are equal, round, and reactive to light.  Cardiovascular:     Rate and Rhythm: Normal rate and regular rhythm.     Pulses: Normal pulses.     Heart sounds: Normal heart sounds.  Pulmonary:     Effort: Pulmonary effort is normal. No respiratory distress.     Breath sounds: Normal breath sounds. No wheezing or rales.  Abdominal:     Palpations: Abdomen is soft.     Tenderness: There is abdominal tenderness. There is no guarding or rebound. Negative signs include Murphy's sign and McBurney's sign.     Comments: Hyperactive bowel sounds lower abdomen. Patient is passing loose stool and gas in the room   Genitourinary:    Rectum: Guaiac result negative.  Musculoskeletal:        General: Normal range of motion.     Cervical back: Neck supple.  Skin:    General: Skin is warm and dry.     Capillary Refill: Capillary refill takes less than 2 seconds.     Findings: No erythema or rash.  Neurological:     General: No focal deficit present.     Mental Status: She is alert.     Deep Tendon Reflexes: Reflexes normal.  Psychiatric:        Mood and Affect: Mood normal.     ED Results / Procedures / Treatments   Labs (all labs ordered are listed, but only abnormal results are displayed) Results for orders placed or performed during the hospital encounter of 10/29/23  hCG, serum, qualitative   Collection Time: 10/29/23  3:22 AM  Result Value Ref Range   Preg, Serum NEGATIVE NEGATIVE  CBC with Differential   Collection Time: 10/29/23  3:22 AM  Result Value Ref Range   WBC 3.8 (L) 4.0 - 10.5 K/uL   RBC 3.78 (L) 3.87 - 5.11 MIL/uL   Hemoglobin 10.8 (L) 12.0 - 15.0 g/dL   HCT 16.1 (L) 09.6 - 04.5 %   MCV 90.2 80.0 - 100.0 fL   MCH 28.6 26.0 - 34.0 pg   MCHC 31.7 30.0 - 36.0 g/dL   RDW 40.9 81.1 - 91.4 %   Platelets 477 (H) 150 - 400 K/uL   nRBC 0.0 0.0 - 0.2 %   Neutrophils Relative % 69 %   Neutro Abs 2.6 1.7 - 7.7 K/uL   Lymphocytes Relative 28 %   Lymphs Abs 1.1 0.7  - 4.0 K/uL   Monocytes Relative 1 %   Monocytes Absolute 0.0 (L) 0.1 - 1.0 K/uL   Eosinophils Relative 1 %   Eosinophils Absolute 0.0 0.0 - 0.5 K/uL   Basophils Relative 0 %   Basophils Absolute 0.0 0.0 - 0.1 K/uL   Immature Granulocytes 1 %   Abs Immature Granulocytes 0.02 0.00 - 0.07 K/uL  Comprehensive metabolic  panel   Collection Time: 10/29/23  3:22 AM  Result Value Ref Range   Sodium 139 135 - 145 mmol/L   Potassium 2.0 (LL) 3.5 - 5.1 mmol/L   Chloride 100 98 - 111 mmol/L   CO2 26 22 - 32 mmol/L   Glucose, Bld 147 (H) 70 - 99 mg/dL   BUN 31 (H) 6 - 20 mg/dL   Creatinine, Ser 1.30 0.44 - 1.00 mg/dL   Calcium 8.4 (L) 8.9 - 10.3 mg/dL   Total Protein 6.5 6.5 - 8.1 g/dL   Albumin 3.4 (L) 3.5 - 5.0 g/dL   AST 15 15 - 41 U/L   ALT 10 0 - 44 U/L   Alkaline Phosphatase 52 38 - 126 U/L   Total Bilirubin 1.4 (H) 0.0 - 1.2 mg/dL   GFR, Estimated >86 >57 mL/min   Anion gap 13 5 - 15  Magnesium   Collection Time: 10/29/23  3:22 AM  Result Value Ref Range   Magnesium 2.1 1.7 - 2.4 mg/dL  I-Stat Lactic Acid, ED   Collection Time: 10/29/23  6:39 AM  Result Value Ref Range   Lactic Acid, Venous 1.7 0.5 - 1.9 mmol/L  POC occult blood, ED Provider will collect   Collection Time: 10/29/23  6:41 AM  Result Value Ref Range   Fecal Occult Bld NEGATIVE NEGATIVE   CT ABDOMEN PELVIS W CONTRAST Result Date: 10/29/2023 CLINICAL DATA:  Abdominal pain after waking up in the middle of the night. EXAM: CT ABDOMEN AND PELVIS WITH CONTRAST TECHNIQUE: Multidetector CT imaging of the abdomen and pelvis was performed using the standard protocol following bolus administration of intravenous contrast. RADIATION DOSE REDUCTION: This exam was performed according to the departmental dose-optimization program which includes automated exposure control, adjustment of the mA and/or kV according to patient size and/or use of iterative reconstruction technique. CONTRAST:  OMNIPAQUE IOHEXOL 300 MG/ML  SOLN  COMPARISON:  CT 04/26/2020, CT 04/25/2019, MRI 09/03/2016. FINDINGS: Lower chest: Ground-glass opacities in atelectasis noted within the lung bases, left greater than right. No pleural effusion identified Hepatobiliary: Previously characterized lesion within the posterolateral right lobe of liver is again seen measuring 1.4 cm. 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 Caudate lobe of liver hemangioma is also unchanged measuring 1.9 cm, image 23/2. 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 On the prior exam from 09/03/2016 this measured 5.6 x 4.0 cm. The gallbladder appears mildly distended. There is no wall thickening or kidney stones identified. Common bile duct is mildly increased in caliber with mild intrahepatic biliary dilatation measuring 9 mm. Pancreas: Unremarkable. No pancreatic ductal dilatation or surrounding inflammatory changes. Spleen: Normal in size without focal abnormality. Adrenals/Urinary Tract: There is a an exophytic lesion arising off the anterolateral cortex of the left kidney measuring 0.9 cm. Signs of internal enhancement noted with Hounsfield units is I is 165, image 35/2. No additional focal kidney lesions. No nephrolithiasis or hydronephrosis. Bladder is mildly distended and there is gas within the non dependent portion of the bladder. Stomach/Bowel: Stomach appears normal. There is bowel wall thickening/edema with mucosal enhancement involving the small and large bowel loops. This appears to be a chronic abnormality and has been reported across multiple studies. No signs of pneumatosis. The small bowel loops appear mildly prominent measuring up to 2.6 cm in diameter. The appendix is suboptimally visualized. Vascular/Lymphatic: Patent abdominal aorta and upper abdominal vascularity. No signs of abdominopelvic adenopathy.  There is diffuse edema throughout the mesentery. Reproductive:  Uterus and bilateral adnexa are unremarkable. Other: Signs of pneumoperitoneum identified. Multiple foci free air noted predominantly within the ventral portions of the abdomen. Etiology is indeterminate but perforated viscus cannot be excluded. Free fluid is identified within the abdomen and pelvis this measures up to 26 Hounsfield units which may reflect underlying hemoperitoneum. Musculoskeletal: Remote healed ninth lateral rib fracture. No acute or suspicious osseous findings. IMPRESSION: 1. Signs of pneumoperitoneum identified. Multiple foci of free air noted predominantly within the ventral portions of the abdomen. Etiology is indeterminate but perforated viscus cannot be excluded. 2. Free fluid is identified within the abdomen and pelvis this measures up to 26 Hounsfield units which may reflect underlying hemoperitoneum versus peritonitis. 3. There is bowel wall thickening/edema with mucosal enhancement involving the small and large bowel loops. This appears to be a chronic abnormality and has been reported across multiple studies. No signs of pneumatosis. The small bowel loops appear mildly prominent measuring up to 2.6 cm in diameter. Bowel wall thickening may be the result of chronic protein losing enteropathy, inflammatory bowel disease, or edema from hypoproteinemia. 4. There is a new exophytic lesion arising off the anterolateral cortex of the left kidney measuring 0.9 cm. This exhibits signs of internal enhancement noted with Hounsfield units up to 165. This may reflect a small renal cell carcinoma. Consider further evaluation with nonemergent renal protocol MRI. 5. Ground-glass opacities and atelectasis noted within the lung bases, left greater than right. 6. Stable appearance of previously characterized liver lesions. 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 On the prior exam from 09/03/2016 this measured 5.6 x 4.0  cm. Critical Value/emergent results were called by telephone at the time of interpretation on 10/29/2023 at 6:38 am to provider [facility] , who verbally acknowledged these results. Electronically Signed   By: Signa Kell M.D.   On: 10/29/2023 06:38    EKG EKG Interpretation Date/Time:  Saturday October 29 2023 03:32:38 EDT Ventricular Rate:  91 PR Interval:  127 QRS Duration:  80 QT Interval:  450 QTC Calculation: 554 R Axis:   24  Text Interpretation: Sinus rhythm Prolonged QT interval Confirmed by Ona Rathert ([id number]) on 10/29/2023 3:59:42 AM  Radiology CT ABDOMEN PELVIS W CONTRAST Result Date: 10/29/2023 CLINICAL DATA:  Abdominal pain after waking up in the middle of the night. EXAM: CT ABDOMEN AND PELVIS WITH CONTRAST TECHNIQUE: Multidetector CT imaging of the abdomen and pelvis was performed using the standard protocol following bolus administration of intravenous contrast. RADIATION DOSE REDUCTION: This exam was performed according to the departmental dose-optimization program which includes automated exposure control, adjustment of the mA and/or kV according to patient size and/or use of iterative reconstruction technique. CONTRAST:  OMNIPAQUE IOHEXOL 300 MG/ML  SOLN COMPARISON:  CT 04/26/2020, CT 04/25/2019, MRI 09/03/2016. FINDINGS: Lower chest: Ground-glass opacities in atelectasis noted within the lung bases, left greater than right. No pleural effusion identified Hepatobiliary: Previously characterized lesion within the posterolateral right lobe of liver is again seen measuring 1.4 cm. 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 Caudate lobe of liver hemangioma is also unchanged measuring 1.9 cm, image 23/2. 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 On the prior exam from 09/03/2016 this measured 5.6 x 4.0 cm. The gallbladder appears mildly distended. There is no wall  thickening  or kidney stones identified. Common bile duct is mildly increased in caliber with mild intrahepatic biliary dilatation measuring 9 mm. Pancreas: Unremarkable. No pancreatic ductal dilatation or surrounding inflammatory changes. Spleen: Normal in size without focal abnormality. Adrenals/Urinary Tract: There is a an exophytic lesion arising off the anterolateral cortex of the left kidney measuring 0.9 cm. Signs of internal enhancement noted with Hounsfield units is I is 165, image 35/2. No additional focal kidney lesions. No nephrolithiasis or hydronephrosis. Bladder is mildly distended and there is gas within the non dependent portion of the bladder. Stomach/Bowel: Stomach appears normal. There is bowel wall thickening/edema with mucosal enhancement involving the small and large bowel loops. This appears to be a chronic abnormality and has been reported across multiple studies. No signs of pneumatosis. The small bowel loops appear mildly prominent measuring up to 2.6 cm in diameter. The appendix is suboptimally visualized. Vascular/Lymphatic: Patent abdominal aorta and upper abdominal vascularity. No signs of abdominopelvic adenopathy. There is diffuse edema throughout the mesentery. Reproductive: Uterus and bilateral adnexa are unremarkable. Other: Signs of pneumoperitoneum identified. Multiple foci free air noted predominantly within the ventral portions of the abdomen. Etiology is indeterminate but perforated viscus cannot be excluded. Free fluid is identified within the abdomen and pelvis this measures up to 26 Hounsfield units which may reflect underlying hemoperitoneum. Musculoskeletal: Remote healed ninth lateral rib fracture. No acute or suspicious osseous findings. IMPRESSION: 1. Signs of pneumoperitoneum identified. Multiple foci of free air noted predominantly within the ventral portions of the abdomen. Etiology is indeterminate but perforated viscus cannot be excluded. 2. Free fluid is  identified within the abdomen and pelvis this measures up to 26 Hounsfield units which may reflect underlying hemoperitoneum versus peritonitis. 3. There is bowel wall thickening/edema with mucosal enhancement involving the small and large bowel loops. This appears to be a chronic abnormality and has been reported across multiple studies. No signs of pneumatosis. The small bowel loops appear mildly prominent measuring up to 2.6 cm in diameter. Bowel wall thickening may be the result of chronic protein losing enteropathy, inflammatory bowel disease, or edema from hypoproteinemia. 4. There is a new exophytic lesion arising off the anterolateral cortex of the left kidney measuring 0.9 cm. This exhibits signs of internal enhancement noted with Hounsfield units up to 165. This may reflect a small renal cell carcinoma. Consider further evaluation with nonemergent renal protocol MRI. 5. Ground-glass opacities and atelectasis noted within the lung bases, left greater than right. 6. Stable appearance of previously characterized liver lesions. 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 On the prior exam from 09/03/2016 this measured 5.6 x 4.0 cm. Critical Value/emergent results were called by telephone at the time of interpretation on 10/29/2023 at 6:38 am to provider [facility] , who verbally acknowledged these results. Electronically Signed   By: Signa Kell M.D.   On: 10/29/2023 06:38    Procedures .Critical Care  Performed by: Cy Blamer, MD Authorized by: Cy Blamer, MD   Critical care provider statement:    Critical care time (minutes):  90   Critical care end time:  10/29/2023 7:22 AM   Critical care was necessary to treat or prevent imminent or life-threatening deterioration of the following conditions:  Sepsis   Critical care was time spent personally by me on the following activities:  Examination of patient,  ordering and performing treatments and interventions, ordering and review of laboratory studies, ordering and  review of radiographic studies, pulse oximetry, re-evaluation of patient's condition and review of old charts (multiple consults)   I assumed direction of critical care for this patient from another provider in my specialty: no     Care discussed with: admitting provider       Medications Ordered in ED Medications  potassium chloride 10 mEq in 100 mL IVPB (10 mEq Intravenous New Bag/Given 10/29/23 0552)  lactated ringers infusion ( Intravenous New Bag/Given 10/29/23 0626)  lactated ringers bolus 1,000 mL (1,000 mLs Intravenous New Bag/Given 10/29/23 0626)  ceFEPIme (MAXIPIME) 2 g in sodium chloride 0.9 % 100 mL IVPB (2 g Intravenous New Bag/Given 10/29/23 1914)  metroNIDAZOLE (FLAGYL) IVPB 500 mg (has no administration in time range)  sodium chloride 0.9 % bolus 1,000 mL (0 mLs Intravenous Stopped 10/29/23 0640)  potassium chloride SA (KLOR-CON M) CR tablet 60 mEq (60 mEq Oral Given 10/29/23 0556)  magnesium sulfate IVPB 2 g 50 mL (0 g Intravenous Stopped 10/29/23 0626)  trimethobenzamide (TIGAN) injection 200 mg (200 mg Intramuscular Given 10/29/23 0437)  ketorolac (TORADOL) 15 MG/ML injection 15 mg (15 mg Intravenous Given 10/29/23 0415)  iohexol (OMNIPAQUE) 300 MG/ML solution 100 mL (100 mLs Intravenous Contrast Given 10/29/23 0507)  fentaNYL (SUBLIMAZE) injection 50 mcg (50 mcg Intravenous Given 10/29/23 0649)  fentaNYL (SUBLIMAZE) injection 25 mcg (25 mcg Intravenous Given 10/29/23 7829)    ED Course/ Medical Decision Making/ A&P                                 Medical Decision Making Patient with a h/o vomiting and pseudo-obstruction presents with sudden onset pain n/v/d   Amount and/or Complexity of Data Reviewed Independent Historian: EMS    Details: See above  External Data Reviewed: labs and notes.    Details: Previous visits reviewed Labs: ordered.    Details: Negative  pregnancy test, low blood count 3.8, low hemoglobin 10.8, elevated platelets 477.  Normal sodium 139, low potassium 2, low creatinine 0.78, lactate 1.7 normal, hemoccult negative, magnesium 2.1  Radiology: ordered and independent interpretation performed.    Details: Air in the bladder, thickened bowel diffusely by me  ECG/medicine tests: ordered and independent interpretation performed. Decision-making details documented in ED Course. Discussion of management or test interpretation with external provider(s): 7:00 AM case d/w Dr. Sherryll Burger of ICU who will admit the patient 6:22 AM case d/w Dr. Donell Beers, am team to see the patient for surgery 6:38 AM case d/w Dr. Donell Beers.  I have updated Dr. Donell Beers on chat received from Dr. Bradly Chris of radiology that there may be blood vs.  Peritonitis in the abdomen. AM team to see    Risk Prescription drug management. Decision regarding hospitalization.    Final Clinical Impression(s) / ED Diagnoses Final diagnoses:  Generalized abdominal pain  Hypokalemia  Vaginal bleeding  Pneumoperitoneum  Hemoperitoneum  Thickened small bowel   The patient appears reasonably stabilized for admission considering the current resources, flow, and capabilities available in the ED at this time, and I doubt any other [facility] requiring further screening and/or treatment in the ED prior to admission.   Rx / DC Orders ED Discharge Orders     None         Nathanael Krist, MD 10/29/23 5621

## 2023-10-29 NOTE — H&P (Addendum)
 NAME:  Stacy [name], MRN:  [id number], DOB:  [birth year], LOS: 0 ADMISSION DATE:  10/29/2023, CONSULTATION DATE:  10/29/2023  REFERRING MD:  Nicanor Alcon, EDP, CHIEF COMPLAINT: Abdominal pain  History of Present Illness:  43 year old woman with a history of chronic intestinal pseudoobstruction presented with severe abdominal pain that started around midnight.  She was hypotensive initially responding to IV fluids. Labs showed normal lactate, potassium 2.0, mild leukopenia 3.8, chronic anemia 10.8 CT abdomen showed pneumoperitoneum with free fluid in the abdomen, bowel thickening/edema small and large bowel which was chronic without pneumatosis, left kidney lesion 0.9 cm, stable liver lesions Seen by surgery and plan is to take to the OR  Pertinent  Medical History  B12 and iron deficiency Protein calorie malnutrition Chronic hypotension -on midodrine and fludrocortisone  Significant Hospital Events: Including procedures, antibiotic start and stop dates in addition to other pertinent events     Interim History / Subjective:  Complains of abdominal pain Afebrile  Objective   Blood pressure 101/69, pulse (!) 102, temperature 98.3 F (36.8 C), resp. rate 16, SpO2 98%.        Intake/Output Summary (Last 24 hours) at 10/29/2023 0833 Last data filed at 10/29/2023 0825 Gross per 24 hour  Intake 1194.94 ml  Output --  Net 1194.94 ml   There were no vitals filed for this visit.  Examination: General: Thin woman, lying supine mild distress due to pain HENT: Mild pallor, no icterus, no JVD, dry mucosa Lungs: Clear breath sounds bilateral, no accessory muscle use Cardiovascular: S1-S2 regular Abdomen: Soft, diffuse tenderness, mild guarding Extremities: No edema, no deformity Neuro: Alert, oriented, nonfocal   Labs show hypokalemia 2.0, magnesium 2.1, bilirubin 1.4, WBC count 3.8, hemoglobin 10.8, INR 1.4, platelets normal  Resolved Hospital Problem list     Assessment & Plan:   Acute abdomen/pneumoperitoneum likely due to perforated viscus  -Exploratory laparotomy planned by surgery -PCCM will admit to assist with critical illness depending on intra/ postoperative course    Chronic hypotension -maintained on midodrine and Florinef by cardiology, does not carry a formal diagnosis of renal insufficiency in fact baseline cortisol was 26 making this less likely -Blood pressure has responded well to fluid resuscitation -Will give 1 dose of Solu-Cortef 100 mg  Hypokalemia -being repleted IV  Iron deficiency anemia/B12 deficiency with questionable absorption -Supplement as needed   Best Practice (right click and "Reselect all SmartList Selections" daily)   Diet/type: NPO DVT prophylaxis SCD Pressure ulcer(s): N/A GI prophylaxis: N/A Lines: N/A Foley:  N/A Code Status:  full code Last date of multidisciplinary goals of care discussion [NA]  Labs   CBC: Recent Labs  Lab 10/29/23 0322  WBC 3.8*  NEUTROABS 2.6  HGB 10.8*  HCT 34.1*  MCV 90.2  PLT 477*    Basic Metabolic Panel: Recent Labs  Lab 10/29/23 0322  NA 139  K 2.0*  CL 100  CO2 26  GLUCOSE 147*  BUN 31*  CREATININE 0.78  CALCIUM 8.4*  MG 2.1   GFR: CrCl cannot be calculated (Unknown ideal weight.). Recent Labs  Lab 10/29/23 0322 10/29/23 0639  WBC 3.8*  --   LATICACIDVEN  --  1.7    Liver Function Tests: Recent Labs  Lab 10/29/23 0322  AST 15  ALT 10  ALKPHOS 52  BILITOT 1.4*  PROT 6.5  ALBUMIN 3.4*   No results for input(s): "LIPASE", "AMYLASE" in the last 168 hours. No results for input(s): "AMMONIA" in the last 168 hours.  ABG  No results found for: "PHART", "PCO2ART", "PO2ART", "HCO3", "TCO2", "ACIDBASEDEF", "O2SAT"   Coagulation Profile: Recent Labs  Lab 10/29/23 0635  INR 1.4*    Cardiac Enzymes: No results for input(s): "CKTOTAL", "CKMB", "CKMBINDEX", "TROPONINI" in the last 168 hours.  HbA1C: Hgb A1c MFr Bld  Date/Time Value Ref Range Status   07/28/2013 04:30 AM 5.1 <5.7 % Final    Comment:    (NOTE)                                                                       According to the ADA Clinical Practice Recommendations for 2011, when HbA1c is used as a screening test:  >=6.5%   Diagnostic of Diabetes Mellitus           (if abnormal result is confirmed) 5.7-6.4%   Increased risk of developing Diabetes Mellitus References:Diagnosis and Classification of Diabetes Mellitus,Diabetes Care,2011,34(Suppl 1):S62-S69 and Standards of Medical Care in         Diabetes - 2011,Diabetes Care,2011,34 (Suppl 1):S11-S61.    CBG: No results for input(s): "GLUCAP" in the last 168 hours.  Review of Systems:   Constitutional: negative for anorexia, fevers and sweats  Eyes: negative for irritation, redness and visual disturbance  Ears, nose, mouth, throat, and face: negative for earaches, epistaxis, nasal congestion and sore throat  Respiratory: negative for cough, dyspnea on exertion, sputum and wheezing  Cardiovascular: negative for chest pain, dyspnea, lower extremity edema, orthopnea, palpitations and syncope  Gastrointestinal: negative for constipation, diarrhea, melena, nausea and vomiting  Genitourinary:negative for dysuria, frequency and hematuria  Hematologic/lymphatic: negative for bleeding, easy bruising and lymphadenopathy  Musculoskeletal:negative for arthralgias, muscle weakness and stiff joints  Neurological: negative for coordination problems, gait problems, headaches and weakness  Endocrine: negative for diabetic symptoms including polydipsia, polyuria and weight loss   Past Medical History:  She,  has a past medical history of Anxiety, Benign liver cyst, Chronic intestinal pseudo-obstruction, GERD (gastroesophageal reflux disease), History of UTI, and Nausea and vomiting (04/28/2020).   Surgical History:   Past Surgical History:  Procedure Laterality Date   BIOPSY  05/20/2020   Procedure: BIOPSY;  Surgeon: Graylin Shiver, MD;  Location: [facility];  Service: Endoscopy;;   CESAREAN SECTION     twice 2010, 2013   ESOPHAGOGASTRODUODENOSCOPY (EGD) WITH PROPOFOL N/A 05/20/2020   Procedure: ESOPHAGOGASTRODUODENOSCOPY (EGD) WITH PROPOFOL;  Surgeon: Graylin Shiver, MD;  Location: [facility];  Service: Endoscopy;  Laterality: N/A;   Eustace Pen  ~2015     Social History:   reports that she has never smoked. She has never used smokeless tobacco. She reports that she does not drink alcohol and does not use drugs.   Family History:  Her family history includes CAD in her father; Diabetes in her brother and father; Hypertension in her father. There is no history of Colon cancer, Breast cancer, or Adrenal disorder.   Allergies Allergies  Allergen Reactions   Tetracyclines & Related Swelling    Swelling in spine; required spinal tap.    Erythromycin Other (See Comments)    Swelling in spine; required spinal tap   Raspberry Rash   Sulfa Antibiotics Rash    hives   Sulfonamide Derivatives Rash    Reaction to cream  Home Medications  Prior to Admission medications   Medication Sig Start Date End Date Taking? Authorizing Provider  cyanocobalamin (,VITAMIN B-12,) 1000 MCG/ML injection Inject 1,000 mcg into the muscle every 30 (thirty) days.    [provider]  famotidine (PEPCID) 20 MG tablet TAKE 1 TABLET BY MOUTH EVERYDAY AT BEDTIME 08/01/20   [name], MD  fludrocortisone (FLORINEF) 0.1 MG tablet TAKE 1 TABLET(0.1 MG) BY MOUTH TWICE DAILY 07/25/23   [name], MD  megestrol (MEGACE ES) 625 MG/5ML suspension SHAKE LIQUID AND TAKE 5 ML(625 MG) BY MOUTH DAILY 10/24/23   [name], MD  metoCLOPramide (REGLAN) 5 MG tablet Take 5 mg by mouth 3 (three) times daily before meals.    [provider]  midodrine (PROAMATINE) 5 MG tablet Take 1 tablet (5 mg total) by mouth 3 (three) times daily with meals. 03/17/23   Chilton Si, MD  mirtazapine (REMERON) 15 MG  tablet TAKE 1 TABLET BY MOUTH EVERYDAY AT BEDTIME 04/23/21   [name], MD  montelukast (SINGULAIR) 10 MG tablet Take 1 tablet (10 mg total) by mouth daily. 06/01/23   [name], MD  Multiple Vitamin (MULTIVITAMIN WITH MINERALS) TABS tablet Take 1 tablet by mouth daily. 05/29/20   Rodolph Bong, MD  polyethylene glycol (MIRALAX / GLYCOLAX) 17 g packet Take 17 g by mouth 3 (three) times daily. 05/28/20   Rodolph Bong, MD  promethazine-dextromethorphan (PROMETHAZINE-DM) 6.25-15 MG/5ML syrup Take 5 mLs by mouth 4 (four) times daily as needed. 09/29/23   [name], MD  RABEprazole (ACIPHEX) 20 MG tablet Take 1 tablet (20 mg total) by mouth daily. 07/29/23 09/28/25  [name], MD  sucralfate (CARAFATE) 1 g tablet Take 1 g by mouth 4 (four) times daily. 12/30/20   [provider]  Vitamin D, Ergocalciferol, (DRISDOL) 1.25 MG (50000 UNIT) CAPS capsule TAKE 1 CAPSULE BY MOUTH EVERY 7 DAYS 03/02/23   [name], MD       Cyril Mourning MD. FCCP. [facility] Pager : [phone]  If no response to pager , please call [phone] until 7 pm After 7:00 pm call Elink  [phone]   10/29/2023

## 2023-10-29 NOTE — Consult Note (Signed)
 Reason for Consult:Free air Referring Physician: [name] is an 43 y.o. female.  HPI:  Patient presented urgently to the emergency department after being awakened from sleep with severe sudden onset abdominal pain.  This is accompanied by nausea,vomiting, and diarrhea.  Patient denies any previous history of abdominal pain like this.  The pain is worse when she moves and the pain medication has helped some but has not alleviated her discomfort.  Patient is noted to have vaginal bleeding today.  She has chronically irregular periods.  The patient does have a history of intestinal issues, but things have been stable now for several years.  She in the past was treated for small bowel intestinal overgrowth and then chronic idiopathic constipation.  She was admitted in the [facility] system in 2020 for chronic intestinal pseudoobstruction associated with abdominal pain and nausea and vomiting.  Since that time however she has not required additional admissions and has been managed at home with treatment for chronic idiopathic constipation at the direction of Dr. Bosie Clos from [facility].  Of note, the patient also has had chronic hypotension.  She is treated with midodrine and fludrocortisone with Dr. Duke Salvia.    She has chronic iron deficiency anemia treated by Dr. Bertis Ruddy.    Past Medical History:  Diagnosis Date   Anxiety    Benign liver cyst    Chronic intestinal pseudo-obstruction    GERD (gastroesophageal reflux disease)    History of UTI    Nausea and vomiting 04/28/2020    Past Surgical History:  Procedure Laterality Date   BIOPSY  05/20/2020   Procedure: BIOPSY;  Surgeon: Graylin Shiver, MD;  Location: [facility];  Service: Endoscopy;;   CESAREAN SECTION     twice 2010, 2013   ESOPHAGOGASTRODUODENOSCOPY (EGD) WITH PROPOFOL N/A 05/20/2020   Procedure: ESOPHAGOGASTRODUODENOSCOPY (EGD) WITH PROPOFOL;  Surgeon: Graylin Shiver, MD;  Location: [facility];  Service:  Endoscopy;  Laterality: N/A;   Eustace Pen  ~2015    Family History  Problem Relation Age of Onset   CAD Father    Hypertension Father    Diabetes Father    Diabetes Brother    Colon cancer Neg Hx    Breast cancer Neg Hx    Adrenal disorder Neg Hx     Social History:  reports that she has never smoked. She has never used smokeless tobacco. She reports that she does not drink alcohol and does not use drugs.  Allergies:  Allergies  Allergen Reactions   Tetracyclines & Related Swelling    Swelling in spine; required spinal tap.    Erythromycin Other (See Comments)    Swelling in spine; required spinal tap   Raspberry Rash   Sulfa Antibiotics Rash    hives   Sulfonamide Derivatives Rash    Reaction to cream    Medications:  Midodrine Fludrocortisone Vit B12 Megace Pepcid Reglan Remeron Singulair Miralax Aciphex Carafate Vit d  Results for orders placed or performed during the hospital encounter of 10/29/23 (from the past 48 hours)  hCG, serum, qualitative     Status: None   Collection Time: 10/29/23  3:22 AM  Result Value Ref Range   Preg, Serum NEGATIVE NEGATIVE    Comment:        THE SENSITIVITY OF THIS METHODOLOGY IS >10 mIU/mL. Performed at [facility], [address] [place], [place] [address]   CBC with Differential     Status: Abnormal   Collection Time: 10/29/23  3:22 AM  Result Value Ref Range   WBC 3.8 (L) 4.0 - 10.5 K/uL   RBC 3.78 (L) 3.87 - 5.11 MIL/uL   Hemoglobin 10.8 (L) 12.0 - 15.0 g/dL   HCT 16.1 (L) 09.6 - 04.5 %   MCV 90.2 80.0 - 100.0 fL   MCH 28.6 26.0 - 34.0 pg   MCHC 31.7 30.0 - 36.0 g/dL   RDW 40.9 81.1 - 91.4 %   Platelets 477 (H) 150 - 400 K/uL   nRBC 0.0 0.0 - 0.2 %   Neutrophils Relative % 69 %   Neutro Abs 2.6 1.7 - 7.7 K/uL   Lymphocytes Relative 28 %   Lymphs Abs 1.1 0.7 - 4.0 K/uL   Monocytes Relative 1 %   Monocytes Absolute 0.0 (L) 0.1 - 1.0 K/uL   Eosinophils Relative 1 %   Eosinophils Absolute  0.0 0.0 - 0.5 K/uL   Basophils Relative 0 %   Basophils Absolute 0.0 0.0 - 0.1 K/uL   Immature Granulocytes 1 %   Abs Immature Granulocytes 0.02 0.00 - 0.07 K/uL    Comment: Performed at [facility], [address], [place], [place] [address]  Comprehensive metabolic panel     Status: Abnormal   Collection Time: 10/29/23  3:22 AM  Result Value Ref Range   Sodium 139 135 - 145 mmol/L   Potassium 2.0 (LL) 3.5 - 5.1 mmol/L    Comment: CRITICAL RESULT CALLED TO, READ BACK BY AND VERIFIED WITH C. FEVRIER, RN [phone] 10/29/23 BY Jonnie Finner    Chloride 100 98 - 111 mmol/L   CO2 26 22 - 32 mmol/L   Glucose, Bld 147 (H) 70 - 99 mg/dL    Comment: Glucose reference range applies only to samples taken after fasting for at least 8 hours.   BUN 31 (H) 6 - 20 mg/dL   Creatinine, Ser 0.86 0.44 - 1.00 mg/dL   Calcium 8.4 (L) 8.9 - 10.3 mg/dL   Total Protein 6.5 6.5 - 8.1 g/dL   Albumin 3.4 (L) 3.5 - 5.0 g/dL   AST 15 15 - 41 U/L   ALT 10 0 - 44 U/L   Alkaline Phosphatase 52 38 - 126 U/L   Total Bilirubin 1.4 (H) 0.0 - 1.2 mg/dL   GFR, Estimated >57 >84 mL/min    Comment: (NOTE) Calculated using the CKD-EPI Creatinine Equation (2021)    Anion gap 13 5 - 15    Comment: Performed at [facility], [address] [place], [place] [address]  Magnesium     Status: None   Collection Time: 10/29/23  3:22 AM  Result Value Ref Range   Magnesium 2.1 1.7 - 2.4 mg/dL    Comment: Performed at [facility], [address], [place], [place] [address]  Protime-INR     Status: Abnormal   Collection Time: 10/29/23  6:35 AM  Result Value Ref Range   Prothrombin Time 17.3 (H) 11.4 - 15.2 seconds   INR 1.4 (H) 0.8 - 1.2    Comment: (NOTE) INR goal varies based on device and disease states. Performed at [facility], [address], [place], [place] [address]   APTT     Status: None   Collection Time: 10/29/23  6:35 AM  Result Value  Ref Range   aPTT 30 24 - 36 seconds    Comment: Performed at [facility], [address], [place], [place] [address]  I-Stat Lactic Acid,  ED     Status: None   Collection Time: 10/29/23  6:39 AM  Result Value Ref Range   Lactic Acid, Venous 1.7 0.5 - 1.9 mmol/L  POC occult blood, ED Provider will collect     Status: None   Collection Time: 10/29/23  6:41 AM  Result Value Ref Range   Fecal Occult Bld NEGATIVE NEGATIVE    DG Chest Port 1 View Result Date: 10/29/2023 CLINICAL DATA:  Sepsis.  Severe abdominal pain. EXAM: PORTABLE CHEST 1 VIEW COMPARISON:  CT AP from earlier today. FINDINGS: Normal cardiomediastinal contours. Decreased lung volumes. Asymmetric opacity within the left lower lung identified concerning for atelectasis and or pneumonia. Right lung appears clear. IMPRESSION: Asymmetric opacity within the left lower lung concerning for atelectasis and/or pneumonia. Electronically Signed   By: Signa Kell M.D.   On: 10/29/2023 07:53   CT ABDOMEN PELVIS W CONTRAST Result Date: 10/29/2023 CLINICAL DATA:  Abdominal pain after waking up in the middle of the night. EXAM: CT ABDOMEN AND PELVIS WITH CONTRAST TECHNIQUE: Multidetector CT imaging of the abdomen and pelvis was performed using the standard protocol following bolus administration of intravenous contrast. RADIATION DOSE REDUCTION: This exam was performed according to the departmental dose-optimization program which includes automated exposure control, adjustment of the mA and/or kV according to patient size and/or use of iterative reconstruction technique. CONTRAST:  OMNIPAQUE IOHEXOL 300 MG/ML  SOLN COMPARISON:  CT 04/26/2020, CT 04/25/2019, MRI 09/03/2016. FINDINGS: Lower chest: Ground-glass opacities in atelectasis noted within the lung bases, left greater than right. No pleural effusion identified Hepatobiliary: Previously characterized lesion within the posterolateral right lobe of liver is again seen  measuring 1.4 cm. 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 Caudate lobe of liver hemangioma is also unchanged measuring 1.9 cm, image 23/2. 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 On the prior exam from 09/03/2016 this measured 5.6 x 4.0 cm. The gallbladder appears mildly distended. There is no wall thickening or kidney stones identified. Common bile duct is mildly increased in caliber with mild intrahepatic biliary dilatation measuring 9 mm. Pancreas: Unremarkable. No pancreatic ductal dilatation or surrounding inflammatory changes. Spleen: Normal in size without focal abnormality. Adrenals/Urinary Tract: There is a an exophytic lesion arising off the anterolateral cortex of the left kidney measuring 0.9 cm. Signs of internal enhancement noted with Hounsfield units is I is 165, image 35/2. No additional focal kidney lesions. No nephrolithiasis or hydronephrosis. Bladder is mildly distended and there is gas within the non dependent portion of the bladder. Stomach/Bowel: Stomach appears normal. There is bowel wall thickening/edema with mucosal enhancement involving the small and large bowel loops. This appears to be a chronic abnormality and has been reported across multiple studies. No signs of pneumatosis. The small bowel loops appear mildly prominent measuring up to 2.6 cm in diameter. The appendix is suboptimally visualized. Vascular/Lymphatic: Patent abdominal aorta and upper abdominal vascularity. No signs of abdominopelvic adenopathy. There is diffuse edema throughout the mesentery. Reproductive: Uterus and bilateral adnexa are unremarkable. Other: Signs of pneumoperitoneum identified. Multiple foci free air noted predominantly within the ventral portions of the abdomen. Etiology is indeterminate but perforated viscus cannot be excluded. Free fluid is identified within the abdomen and pelvis this measures  up to 26 Hounsfield units which may reflect underlying hemoperitoneum. Musculoskeletal: Remote healed ninth lateral rib fracture. No acute or suspicious osseous findings. IMPRESSION: 1. Signs of pneumoperitoneum identified. Multiple foci  of free air noted predominantly within the ventral portions of the abdomen. Etiology is indeterminate but perforated viscus cannot be excluded. 2. Free fluid is identified within the abdomen and pelvis this measures up to 26 Hounsfield units which may reflect underlying hemoperitoneum versus peritonitis. 3. There is bowel wall thickening/edema with mucosal enhancement involving the small and large bowel loops. This appears to be a chronic abnormality and has been reported across multiple studies. No signs of pneumatosis. The small bowel loops appear mildly prominent measuring up to 2.6 cm in diameter. Bowel wall thickening may be the result of chronic protein losing enteropathy, inflammatory bowel disease, or edema from hypoproteinemia. 4. There is a new exophytic lesion arising off the anterolateral cortex of the left kidney measuring 0.9 cm. This exhibits signs of internal enhancement noted with Hounsfield units up to 165. This may reflect a small renal cell carcinoma. Consider further evaluation with nonemergent renal protocol MRI. 5. Ground-glass opacities and atelectasis noted within the lung bases, left greater than right. 6. Stable appearance of previously characterized liver lesions. 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 On the prior exam from 09/03/2016 this measured 5.6 x 4.0 cm. Critical Value/emergent results were called by telephone at the time of interpretation on 10/29/2023 at 6:38 am to provider [facility] , who verbally acknowledged these results. Electronically Signed   By: Signa Kell M.D.   On: 10/29/2023 06:38    Review of Systems  Constitutional: Negative.   HENT:  Negative.    Eyes: Negative.   Respiratory: Negative.    Cardiovascular: Negative.   Gastrointestinal:  Positive for abdominal distention, abdominal pain, diarrhea, nausea and vomiting.  Endocrine:       Chronically low BP  Genitourinary:  Positive for vaginal bleeding.  Musculoskeletal: Negative.   Skin: Negative.   Allergic/Immunologic: Negative.   Neurological: Negative.   Hematological: Negative.   Psychiatric/Behavioral: Negative.    All other systems reviewed and are negative.  Blood pressure 101/69, pulse (!) 102, temperature 98.3 F (36.8 C), resp. rate 16, SpO2 98%. Physical Exam Vitals reviewed.  Constitutional:      General: She is in acute distress.     Appearance: She is ill-appearing and toxic-appearing.     Comments: Very thin  HENT:     Head: Normocephalic and atraumatic.  Eyes:     General: No scleral icterus.    Extraocular Movements: Extraocular movements intact.  Cardiovascular:     Rate and Rhythm: Normal rate and regular rhythm.     Heart sounds: Normal heart sounds. No murmur heard.    No friction rub. No gallop.  Pulmonary:     Effort: Pulmonary effort is normal. No respiratory distress.     Breath sounds: Normal breath sounds. No stridor. No wheezing or rhonchi.  Abdominal:     General: Bowel sounds are decreased. There is distension. There is no abdominal bruit.     Palpations: Abdomen is rigid. There is no shifting dullness, fluid wave, hepatomegaly, splenomegaly or mass.     Tenderness: There is abdominal tenderness (across lower abdomen). There is guarding and rebound.     Hernia: No hernia is present.  Skin:    General: Skin is warm and dry.     Capillary Refill: Capillary refill takes 2 to 3 seconds.     Coloration: Skin is pale. Skin is not cyanotic, jaundiced or mottled.     Findings: No  erythema or rash.  Neurological:     General: No focal deficit present.     Mental Status: She is alert and oriented to person, place, and time.   Psychiatric:        Mood and Affect: Mood is anxious. Mood is not depressed.        Behavior: Behavior normal.     Assessment/Plan: Pneumoperitoneum Severe hypokalemia Sepsis Chronic bowel wall thickening **left kidney lesion**  Chronic iron deficiency anemia Hyperglycemia Hypoalbuminemia Leukopenia Chronic hypotension ? Adrenal insufficiency Chronic idiopathic constipation with history of pseudoobstruction.  Plan ex lap.   The chronically inflamed bowel wall will certainly make things difficult.  Should she have an ulcer this would make leak more likely.  Should she have a small bowel perforation, it is questionable whether a primary anastomosis could be performed given the level of thickening seen on CT.  Certainly with her current status should she have a colonic perforation she would almost certainly get an ostomy.  Her potassium is being corrected.  Stat recheck is being sent.  Discussed that this with anesthesia.  The critical care team has evaluated the patient. Communicated this with Dr. Fredricka Bonine. Patient may end up needing stress dose steroids. Will defer this to Medical team.   Broad spectrum antibiotics indicated.    Will need nonurgent renal protocol MRI to eval renal lesion.     Almond Lint 10/29/2023, 8:21 AM

## 2023-10-29 NOTE — ED Triage Notes (Signed)
 [facility]. Pt reports having abdominal pain upon waking up in the middle of the night. Pt reports pain being very sharp.

## 2023-10-29 NOTE — H&P (View-Only) (Signed)
 Reason for Consult:Free air Referring Physician: [name] is an 43 y.o. female.  HPI:  Patient presented urgently to the emergency department after being awakened from sleep with severe sudden onset abdominal pain.  This is accompanied by nausea,vomiting, and diarrhea.  Patient denies any previous history of abdominal pain like this.  The pain is worse when Stacy [name] moves and the pain medication has helped some but has not alleviated her discomfort.  Patient is noted to have vaginal bleeding today.  Stacy [name] has chronically irregular periods.  The patient does have a history of intestinal issues, but things have been stable now for several years.  Stacy [name] in the past was treated for small bowel intestinal overgrowth and then chronic idiopathic constipation.  Stacy [name] was admitted in the [facility] system in 2020 for chronic intestinal pseudoobstruction associated with abdominal pain and nausea and vomiting.  Since that time however Stacy [name] has not required additional admissions and has been managed at home with treatment for chronic idiopathic constipation at the direction of Dr. Bosie Clos from [facility].  Of note, the patient also has had chronic hypotension.  Stacy [name] is treated with midodrine and fludrocortisone with Dr. Duke Salvia.    Stacy [name] has chronic iron deficiency anemia treated by Dr. Bertis Ruddy.    Past Medical History:  Diagnosis Date   Anxiety    Benign liver cyst    Chronic intestinal pseudo-obstruction    GERD (gastroesophageal reflux disease)    History of UTI    Nausea and vomiting 04/28/2020    Past Surgical History:  Procedure Laterality Date   BIOPSY  05/20/2020   Procedure: BIOPSY;  Surgeon: Graylin Shiver, MD;  Location: [facility];  Service: Endoscopy;;   CESAREAN SECTION     twice 2010, 2013   ESOPHAGOGASTRODUODENOSCOPY (EGD) WITH PROPOFOL N/A 05/20/2020   Procedure: ESOPHAGOGASTRODUODENOSCOPY (EGD) WITH PROPOFOL;  Surgeon: Graylin Shiver, MD;  Location: [facility];  Service:  Endoscopy;  Laterality: N/A;   Eustace Pen  ~2015    Family History  Problem Relation Age of Onset   CAD Father    Hypertension Father    Diabetes Father    Diabetes Brother    Colon cancer Neg Hx    Breast cancer Neg Hx    Adrenal disorder Neg Hx     Social History:  reports that Stacy [name] has never smoked. Stacy [name] has never used smokeless tobacco. Stacy [name] reports that Stacy [name] does not drink alcohol and does not use drugs.  Allergies:  Allergies  Allergen Reactions   Tetracyclines & Related Swelling    Swelling in spine; required spinal tap.    Erythromycin Other (See Comments)    Swelling in spine; required spinal tap   Raspberry Rash   Sulfa Antibiotics Rash    hives   Sulfonamide Derivatives Rash    Reaction to cream    Medications:  Midodrine Fludrocortisone Vit B12 Megace Pepcid Reglan Remeron Singulair Miralax Aciphex Carafate Vit d  Results for orders placed or performed during the hospital encounter of 10/29/23 (from the past 48 hours)  hCG, serum, qualitative     Status: None   Collection Time: 10/29/23  3:22 AM  Result Value Ref Range   Preg, Serum NEGATIVE NEGATIVE    Comment:        THE SENSITIVITY OF THIS METHODOLOGY IS >10 mIU/mL. Performed at [facility], [address] [place], [place] [address]   CBC with Differential     Status: Abnormal   Collection Time: 10/29/23  3:22 AM  Result Value Ref Range   WBC 3.8 (L) 4.0 - 10.5 K/uL   RBC 3.78 (L) 3.87 - 5.11 MIL/uL   Hemoglobin 10.8 (L) 12.0 - 15.0 g/dL   HCT 16.1 (L) 09.6 - 04.5 %   MCV 90.2 80.0 - 100.0 fL   MCH 28.6 26.0 - 34.0 pg   MCHC 31.7 30.0 - 36.0 g/dL   RDW 40.9 81.1 - 91.4 %   Platelets 477 (H) 150 - 400 K/uL   nRBC 0.0 0.0 - 0.2 %   Neutrophils Relative % 69 %   Neutro Abs 2.6 1.7 - 7.7 K/uL   Lymphocytes Relative 28 %   Lymphs Abs 1.1 0.7 - 4.0 K/uL   Monocytes Relative 1 %   Monocytes Absolute 0.0 (L) 0.1 - 1.0 K/uL   Eosinophils Relative 1 %   Eosinophils Absolute  0.0 0.0 - 0.5 K/uL   Basophils Relative 0 %   Basophils Absolute 0.0 0.0 - 0.1 K/uL   Immature Granulocytes 1 %   Abs Immature Granulocytes 0.02 0.00 - 0.07 K/uL    Comment: Performed at [facility], [address], [place], [place] [address]  Comprehensive metabolic panel     Status: Abnormal   Collection Time: 10/29/23  3:22 AM  Result Value Ref Range   Sodium 139 135 - 145 mmol/L   Potassium 2.0 (LL) 3.5 - 5.1 mmol/L    Comment: CRITICAL RESULT CALLED TO, READ BACK BY AND VERIFIED WITH C. FEVRIER, RN [phone] 10/29/23 BY Jonnie Finner    Chloride 100 98 - 111 mmol/L   CO2 26 22 - 32 mmol/L   Glucose, Bld 147 (H) 70 - 99 mg/dL    Comment: Glucose reference range applies only to samples taken after fasting for at least 8 hours.   BUN 31 (H) 6 - 20 mg/dL   Creatinine, Ser 0.86 0.44 - 1.00 mg/dL   Calcium 8.4 (L) 8.9 - 10.3 mg/dL   Total Protein 6.5 6.5 - 8.1 g/dL   Albumin 3.4 (L) 3.5 - 5.0 g/dL   AST 15 15 - 41 U/L   ALT 10 0 - 44 U/L   Alkaline Phosphatase 52 38 - 126 U/L   Total Bilirubin 1.4 (H) 0.0 - 1.2 mg/dL   GFR, Estimated >57 >84 mL/min    Comment: (NOTE) Calculated using the CKD-EPI Creatinine Equation (2021)    Anion gap 13 5 - 15    Comment: Performed at [facility], [address] [place], [place] [address]  Magnesium     Status: None   Collection Time: 10/29/23  3:22 AM  Result Value Ref Range   Magnesium 2.1 1.7 - 2.4 mg/dL    Comment: Performed at [facility], [address], [place], [place] [address]  Protime-INR     Status: Abnormal   Collection Time: 10/29/23  6:35 AM  Result Value Ref Range   Prothrombin Time 17.3 (H) 11.4 - 15.2 seconds   INR 1.4 (H) 0.8 - 1.2    Comment: (NOTE) INR goal varies based on device and disease states. Performed at [facility], [address], [place], [place] [address]   APTT     Status: None   Collection Time: 10/29/23  6:35 AM  Result Value  Ref Range   aPTT 30 24 - 36 seconds    Comment: Performed at [facility], [address], [place], [place] [address]  I-Stat Lactic Acid,  ED     Status: None   Collection Time: 10/29/23  6:39 AM  Result Value Ref Range   Lactic Acid, Venous 1.7 0.5 - 1.9 mmol/L  POC occult blood, ED Provider will collect     Status: None   Collection Time: 10/29/23  6:41 AM  Result Value Ref Range   Fecal Occult Bld NEGATIVE NEGATIVE    DG Chest Port 1 View Result Date: 10/29/2023 CLINICAL DATA:  Sepsis.  Severe abdominal pain. EXAM: PORTABLE CHEST 1 VIEW COMPARISON:  CT AP from earlier today. FINDINGS: Normal cardiomediastinal contours. Decreased lung volumes. Asymmetric opacity within the left lower lung identified concerning for atelectasis and or pneumonia. Right lung appears clear. IMPRESSION: Asymmetric opacity within the left lower lung concerning for atelectasis and/or pneumonia. Electronically Signed   By: Signa Kell M.D.   On: 10/29/2023 07:53   CT ABDOMEN PELVIS W CONTRAST Result Date: 10/29/2023 CLINICAL DATA:  Abdominal pain after waking up in the middle of the night. EXAM: CT ABDOMEN AND PELVIS WITH CONTRAST TECHNIQUE: Multidetector CT imaging of the abdomen and pelvis was performed using the standard protocol following bolus administration of intravenous contrast. RADIATION DOSE REDUCTION: This exam was performed according to the departmental dose-optimization program which includes automated exposure control, adjustment of the mA and/or kV according to patient size and/or use of iterative reconstruction technique. CONTRAST:  OMNIPAQUE IOHEXOL 300 MG/ML  SOLN COMPARISON:  CT 04/26/2020, CT 04/25/2019, MRI 09/03/2016. FINDINGS: Lower chest: Ground-glass opacities in atelectasis noted within the lung bases, left greater than right. No pleural effusion identified Hepatobiliary: Previously characterized lesion within the posterolateral right lobe of liver is again seen  measuring 1.4 cm. 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 Caudate lobe of liver hemangioma is also unchanged measuring 1.9 cm, image 23/2. 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 On the prior exam from 09/03/2016 this measured 5.6 x 4.0 cm. The gallbladder appears mildly distended. There is no wall thickening or kidney stones identified. Common bile duct is mildly increased in caliber with mild intrahepatic biliary dilatation measuring 9 mm. Pancreas: Unremarkable. No pancreatic ductal dilatation or surrounding inflammatory changes. Spleen: Normal in size without focal abnormality. Adrenals/Urinary Tract: There is a an exophytic lesion arising off the anterolateral cortex of the left kidney measuring 0.9 cm. Signs of internal enhancement noted with Hounsfield units is I is 165, image 35/2. No additional focal kidney lesions. No nephrolithiasis or hydronephrosis. Bladder is mildly distended and there is gas within the non dependent portion of the bladder. Stomach/Bowel: Stomach appears normal. There is bowel wall thickening/edema with mucosal enhancement involving the small and large bowel loops. This appears to be a chronic abnormality and has been reported across multiple studies. No signs of pneumatosis. The small bowel loops appear mildly prominent measuring up to 2.6 cm in diameter. The appendix is suboptimally visualized. Vascular/Lymphatic: Patent abdominal aorta and upper abdominal vascularity. No signs of abdominopelvic adenopathy. There is diffuse edema throughout the mesentery. Reproductive: Uterus and bilateral adnexa are unremarkable. Other: Signs of pneumoperitoneum identified. Multiple foci free air noted predominantly within the ventral portions of the abdomen. Etiology is indeterminate but perforated viscus cannot be excluded. Free fluid is identified within the abdomen and pelvis this measures  up to 26 Hounsfield units which may reflect underlying hemoperitoneum. Musculoskeletal: Remote healed ninth lateral rib fracture. No acute or suspicious osseous findings. IMPRESSION: 1. Signs of pneumoperitoneum identified. Multiple foci  of free air noted predominantly within the ventral portions of the abdomen. Etiology is indeterminate but perforated viscus cannot be excluded. 2. Free fluid is identified within the abdomen and pelvis this measures up to 26 Hounsfield units which may reflect underlying hemoperitoneum versus peritonitis. 3. There is bowel wall thickening/edema with mucosal enhancement involving the small and large bowel loops. This appears to be a chronic abnormality and has been reported across multiple studies. No signs of pneumatosis. The small bowel loops appear mildly prominent measuring up to 2.6 cm in diameter. Bowel wall thickening may be the result of chronic protein losing enteropathy, inflammatory bowel disease, or edema from hypoproteinemia. 4. There is a new exophytic lesion arising off the anterolateral cortex of the left kidney measuring 0.9 cm. This exhibits signs of internal enhancement noted with Hounsfield units up to 165. This may reflect a small renal cell carcinoma. Consider further evaluation with nonemergent renal protocol MRI. 5. Ground-glass opacities and atelectasis noted within the lung bases, left greater than right. 6. Stable appearance of previously characterized liver lesions. 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 On the prior exam from 09/03/2016 this measured 5.6 x 4.0 cm. Critical Value/emergent results were called by telephone at the time of interpretation on 10/29/2023 at 6:38 am to provider [facility] , who verbally acknowledged these results. Electronically Signed   By: Signa Kell M.D.   On: 10/29/2023 06:38    Review of Systems  Constitutional: Negative.   HENT:  Negative.    Eyes: Negative.   Respiratory: Negative.    Cardiovascular: Negative.   Gastrointestinal:  Positive for abdominal distention, abdominal pain, diarrhea, nausea and vomiting.  Endocrine:       Chronically low BP  Genitourinary:  Positive for vaginal bleeding.  Musculoskeletal: Negative.   Skin: Negative.   Allergic/Immunologic: Negative.   Neurological: Negative.   Hematological: Negative.   Psychiatric/Behavioral: Negative.    All other systems reviewed and are negative.  Blood pressure 101/69, pulse (!) 102, temperature 98.3 F (36.8 C), resp. rate 16, SpO2 98%. Physical Exam Vitals reviewed.  Constitutional:      General: Stacy [name] is in acute distress.     Appearance: Stacy [name] is ill-appearing and toxic-appearing.     Comments: Very thin  HENT:     Head: Normocephalic and atraumatic.  Eyes:     General: No scleral icterus.    Extraocular Movements: Extraocular movements intact.  Cardiovascular:     Rate and Rhythm: Normal rate and regular rhythm.     Heart sounds: Normal heart sounds. No murmur heard.    No friction rub. No gallop.  Pulmonary:     Effort: Pulmonary effort is normal. No respiratory distress.     Breath sounds: Normal breath sounds. No stridor. No wheezing or rhonchi.  Abdominal:     General: Bowel sounds are decreased. There is distension. There is no abdominal bruit.     Palpations: Abdomen is rigid. There is no shifting dullness, fluid wave, hepatomegaly, splenomegaly or mass.     Tenderness: There is abdominal tenderness (across lower abdomen). There is guarding and rebound.     Hernia: No hernia is present.  Skin:    General: Skin is warm and dry.     Capillary Refill: Capillary refill takes 2 to 3 seconds.     Coloration: Skin is pale. Skin is not cyanotic, jaundiced or mottled.     Findings: No  erythema or rash.  Neurological:     General: No focal deficit present.     Mental Status: Stacy [name] is alert and oriented to person, place, and time.   Psychiatric:        Mood and Affect: Mood is anxious. Mood is not depressed.        Behavior: Behavior normal.     Assessment/Plan: Pneumoperitoneum Severe hypokalemia Sepsis Chronic bowel wall thickening **left kidney lesion**  Chronic iron deficiency anemia Hyperglycemia Hypoalbuminemia Leukopenia Chronic hypotension ? Adrenal insufficiency Chronic idiopathic constipation with history of pseudoobstruction.  Plan ex lap.   The chronically inflamed bowel wall will certainly make things difficult.  Should Stacy [name] have an ulcer this would make leak more likely.  Should Stacy [name] have a small bowel perforation, it is questionable whether a primary anastomosis could be performed given the level of thickening seen on CT.  Certainly with her current status should Stacy [name] have a colonic perforation Stacy [name] would almost certainly get an ostomy.  Her potassium is being corrected.  Stat recheck is being sent.  Discussed that this with anesthesia.  The critical care team has evaluated the patient. Communicated this with Dr. Fredricka Bonine. Patient may end up needing stress dose steroids. Will defer this to Medical team.   Broad spectrum antibiotics indicated.    Will need nonurgent renal protocol MRI to eval renal lesion.     Almond Lint 10/29/2023, 8:21 AM

## 2023-10-29 NOTE — ED Notes (Signed)
 I gave critical I Stat chem 8 result to MD [facility]

## 2023-10-29 NOTE — Transfer of Care (Addendum)
 Immediate Anesthesia Transfer of Care Note  Patient: Stacy [name]  Procedure(s) Performed: LAPAROTOMY, EXPLORATORY; RIGHT EXTENDED COLECTOMY (Abdomen)  Patient Location: ICU  Anesthesia Type:General  Level of Consciousness: sedated  Airway & Oxygen Therapy: Patient remains intubated per anesthesia plan  Post-op Assessment: Report given to RN and Post -op Vital signs reviewed and stable  Post vital signs: Reviewed and stable  Last Vitals:  Vitals Value Taken Time  BP 126/74 10/29/23   1641  Temp 97.4 10/29/23   1641  Pulse 107 10/29/23   1641  Resp 18 10/29/23   1641  SpO2 100 % 10/29/23 1626    Last Pain:  Vitals:   10/29/23 1229  TempSrc: Oral  PainSc:          Complications: Patient transported to ICU intubated and sedated with propofol drip infusing.Vital signs stable. Phenylephrine drip infusing at 30 mcg/min. Patient hand ventilated with ambu bag with 10 liters/ minute of oxygen flow. Report given to ICU RN.

## 2023-10-30 ENCOUNTER — Other Ambulatory Visit: Payer: Self-pay

## 2023-10-30 ENCOUNTER — Encounter (HOSPITAL_COMMUNITY): Payer: Self-pay | Admitting: Pulmonary Disease

## 2023-10-30 DIAGNOSIS — I959 Hypotension, unspecified: Secondary | ICD-10-CM | POA: Diagnosis not present

## 2023-10-30 DIAGNOSIS — R6520 Severe sepsis without septic shock: Secondary | ICD-10-CM | POA: Diagnosis not present

## 2023-10-30 DIAGNOSIS — A419 Sepsis, unspecified organism: Principal | ICD-10-CM

## 2023-10-30 DIAGNOSIS — R100 Acute abdomen: Secondary | ICD-10-CM | POA: Diagnosis not present

## 2023-10-30 LAB — BLOOD CULTURE ID PANEL (REFLEXED) - BCID2

## 2023-10-30 LAB — BASIC METABOLIC PANEL
Anion gap: 9 (ref 5–15)
BUN: 27 mg/dL — ABNORMAL HIGH (ref 6–20)
CO2: 22 mmol/L (ref 22–32)
Calcium: 8.4 mg/dL — ABNORMAL LOW (ref 8.9–10.3)
Chloride: 109 mmol/L (ref 98–111)
Creatinine, Ser: 0.69 mg/dL (ref 0.44–1.00)
GFR, Estimated: >60 mL/min (ref >60)
Glucose, Bld: 110 mg/dL — ABNORMAL HIGH (ref 70–99)
Potassium: 3.5 mmol/L (ref 3.5–5.1)
Sodium: 140 mmol/L (ref 135–145)

## 2023-10-30 LAB — GLUCOSE, CAPILLARY
Glucose-Capillary: 94 mg/dL (ref 70–99)
Glucose-Capillary: 86 mg/dL (ref 70–99)
Glucose-Capillary: 88 mg/dL (ref 70–99)
Glucose-Capillary: 80 mg/dL (ref 70–99)
Glucose-Capillary: 74 mg/dL (ref 70–99)

## 2023-10-30 LAB — CBC
HCT: 35.1 % — ABNORMAL LOW (ref 36.0–46.0)
Hemoglobin: 11 g/dL — ABNORMAL LOW (ref 12.0–15.0)
MCH: 28.7 pg (ref 26.0–34.0)
MCHC: 31.3 g/dL (ref 30.0–36.0)
MCV: 91.6 fL (ref 80.0–100.0)
Platelets: 288 10*3/uL (ref 150–400)
RBC: 3.83 MIL/uL — ABNORMAL LOW (ref 3.87–5.11)
RDW: 14.8 % (ref 11.5–15.5)
WBC: 12.7 10*3/uL — ABNORMAL HIGH (ref 4.0–10.5)
nRBC: 0 % (ref 0.0–0.2)

## 2023-10-30 LAB — MAGNESIUM: Magnesium: 2 mg/dL (ref 1.7–2.4)

## 2023-10-30 LAB — PHOSPHORUS: Phosphorus: 5.2 mg/dL — ABNORMAL HIGH (ref 2.5–4.6)

## 2023-10-30 LAB — TRIGLYCERIDES: Triglycerides: 296 mg/dL — ABNORMAL HIGH (ref <150)

## 2023-10-30 MED ORDER — POTASSIUM CHLORIDE 10 MEQ/100ML IV SOLN
10.0000 meq | INTRAVENOUS | Status: AC
  Administered 2023-10-30 (×4): 10 meq via INTRAVENOUS
  Filled 2023-10-30 (×4): qty 100

## 2023-10-30 MED ORDER — ALBUMIN HUMAN 5 % IV SOLN
12.5000 g | Freq: Once | INTRAVENOUS | Status: AC
  Administered 2023-10-30: 12.5 g via INTRAVENOUS
  Filled 2023-10-30: qty 250

## 2023-10-30 MED ORDER — LACTATED RINGERS IV SOLN: INTRAVENOUS | Status: AC

## 2023-10-30 NOTE — Progress Notes (Signed)
 1 Day Post-Op   Subjective/Chief Complaint: No acute events. On 20 of neo.    Objective: Vital signs in last 24 hours: Temp:  [97.4 F (36.3 C)-99.4 F (37.4 C)] 98.1 F (36.7 C) (03/15 2359) Pulse Rate:  [87-117] 97 (03/16 0815) Resp:  [15-28] 18 (03/16 0815) BP: (88-132)/(58-89) 132/89 (03/15 1700) SpO2:  [92 %-100 %] 100 % (03/16 0815) Arterial Line BP: (90-136)/(48-78) 90/48 (03/16 0815) FiO2 (%):  [40 %-100 %] 50 % (03/16 0752) Weight:  [50 kg-51.2 kg] 51.2 kg (03/16 0703)    Intake/Output from previous day: 03/15 0701 - 03/16 0700 In: 7275 [I.V.:4631.9; Blood:315; IV Piggyback:2328.1] Out: 700 [Urine:100; Drains:500; Blood:100] Intake/Output this shift: Total I/O In: 484.5 [I.V.:248.4; IV Piggyback:236.1] Out: 1200 [Urine:450; Emesis/NG output:450; Drains:300]  Intubated, sedated Regular rate and rhythm, low-dose neo requirement due to sedation Abdomen is soft, tender, VAC in place with good seal, murky appearing serosanguineous output in canister which nurse reports is the second canister since surgery Clear yellow urine in Foley bag   Lab Results:  Recent Labs    10/29/23 1846 10/30/23 0728  WBC 4.8 12.7*  HGB 9.7* 11.0*  HCT 30.7* 35.1*  PLT 270 288   BMET Recent Labs    10/29/23 1846 10/30/23 0435  NA 141 140  K 3.5 3.5  CL 109 109  CO2 23 22  GLUCOSE 115* 110*  BUN 27* 27*  CREATININE 0.72 0.69  CALCIUM 8.4* 8.4*   PT/INR Recent Labs    10/29/23 0635  LABPROT 17.3*  INR 1.4*   ABG Recent Labs    10/29/23 1523 10/29/23 1828  PHART 7.310* 7.35  HCO3 20.3 24.8    Studies/Results: [place] EKG SITE RITE Result Date: 10/30/2023 If Site Rite image not attached, placement could not be confirmed due to current cardiac rhythm.  DG Abd 1 View Result Date: 10/29/2023 CLINICAL DATA:  Enteric catheter placement, abnormal CT EXAM: ABDOMEN - 1 VIEW COMPARISON:  10/29/2023 FINDINGS: Supine frontal view of the lower chest and upper abdomen was  obtained. Pelvis is excluded by collimation. Enteric catheter passes below diaphragm, tip and side port projecting over the gastric body. Unremarkable bowel gas pattern without evidence of obstruction or ileus. The bowel wall thickening seen on previous CT is again appreciated. The pneumoperitoneum identified on prior CT imaging is not well visualized on this supine exam. There is patchy consolidation at the lung bases, left greater than right. IMPRESSION: 1. Enteric catheter tip projecting over the gastric body. 2. Persistent bowel wall thickening, unchanged since CT. 3. The pneumoperitoneum seen on CT is not well visualized on this supine x-ray. 4. Patchy bibasilar consolidation, left greater than right. Electronically Signed   By: Sharlet Salina M.D.   On: 10/29/2023 17:27   Portable Chest x-ray Result Date: 10/29/2023 CLINICAL DATA:  Intubated EXAM: PORTABLE CHEST 1 VIEW COMPARISON:  10/29/2023 FINDINGS: Single frontal view of the chest demonstrates endotracheal tube overlying tracheal air column, tip 3 cm above carina. Enteric catheter passes below diaphragm, tip excluded by collimation but side port projecting over the gastric body. Cardiac silhouette is stable. Persistent patchy consolidation at the lung bases, left greater than right. No effusion or pneumothorax. No acute bony abnormalities. IMPRESSION: 1. Support devices as above. 2. Stable bibasilar consolidation, left greater than right. Electronically Signed   By: Sharlet Salina M.D.   On: 10/29/2023 17:18   DG Chest Port 1 View Result Date: 10/29/2023 CLINICAL DATA:  Sepsis.  Severe abdominal pain. EXAM: PORTABLE CHEST 1  VIEW COMPARISON:  CT AP from earlier today. FINDINGS: Normal cardiomediastinal contours. Decreased lung volumes. Asymmetric opacity within the left lower lung identified concerning for atelectasis and or pneumonia. Right lung appears clear. IMPRESSION: Asymmetric opacity within the left lower lung concerning for atelectasis and/or  pneumonia. Electronically Signed   By: Signa Kell M.D.   On: 10/29/2023 07:53   CT ABDOMEN PELVIS W CONTRAST Result Date: 10/29/2023 CLINICAL DATA:  Abdominal pain after waking up in the middle of the night. EXAM: CT ABDOMEN AND PELVIS WITH CONTRAST TECHNIQUE: Multidetector CT imaging of the abdomen and pelvis was performed using the standard protocol following bolus administration of intravenous contrast. RADIATION DOSE REDUCTION: This exam was performed according to the departmental dose-optimization program which includes automated exposure control, adjustment of the mA and/or kV according to patient size and/or use of iterative reconstruction technique. CONTRAST:  OMNIPAQUE IOHEXOL 300 MG/ML  SOLN COMPARISON:  CT 04/26/2020, CT 04/25/2019, MRI 09/03/2016. FINDINGS: Lower chest: Ground-glass opacities in atelectasis noted within the lung bases, left greater than right. No pleural effusion identified Hepatobiliary: Previously characterized lesion within the posterolateral right lobe of liver is again seen measuring 1.4 cm. 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 Caudate lobe of liver hemangioma is also unchanged measuring 1.9 cm, image 23/2. 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 On the prior exam from 09/03/2016 this measured 5.6 x 4.0 cm. The gallbladder appears mildly distended. There is no wall thickening or kidney stones identified. Common bile duct is mildly increased in caliber with mild intrahepatic biliary dilatation measuring 9 mm. Pancreas: Unremarkable. No pancreatic ductal dilatation or surrounding inflammatory changes. Spleen: Normal in size without focal abnormality. Adrenals/Urinary Tract: There is a an exophytic lesion arising off the anterolateral cortex of the left kidney measuring 0.9 cm. Signs of internal enhancement noted with Hounsfield units is I is 165, image 35/2. No  additional focal kidney lesions. No nephrolithiasis or hydronephrosis. Bladder is mildly distended and there is gas within the non dependent portion of the bladder. Stomach/Bowel: Stomach appears normal. There is bowel wall thickening/edema with mucosal enhancement involving the small and large bowel loops. This appears to be a chronic abnormality and has been reported across multiple studies. No signs of pneumatosis. The small bowel loops appear mildly prominent measuring up to 2.6 cm in diameter. The appendix is suboptimally visualized. Vascular/Lymphatic: Patent abdominal aorta and upper abdominal vascularity. No signs of abdominopelvic adenopathy. There is diffuse edema throughout the mesentery. Reproductive: Uterus and bilateral adnexa are unremarkable. Other: Signs of pneumoperitoneum identified. Multiple foci free air noted predominantly within the ventral portions of the abdomen. Etiology is indeterminate but perforated viscus cannot be excluded. Free fluid is identified within the abdomen and pelvis this measures up to 26 Hounsfield units which may reflect underlying hemoperitoneum. Musculoskeletal: Remote healed ninth lateral rib fracture. No acute or suspicious osseous findings. IMPRESSION: 1. Signs of pneumoperitoneum identified. Multiple foci of free air noted predominantly within the ventral portions of the abdomen. Etiology is indeterminate but perforated viscus cannot be excluded. 2. Free fluid is identified within the abdomen and pelvis this measures up to 26 Hounsfield units which may reflect underlying hemoperitoneum versus peritonitis. 3. There is bowel wall thickening/edema with mucosal enhancement involving the small and large bowel loops. This appears to be a chronic abnormality and has been reported across multiple studies. No signs of pneumatosis. The small bowel loops appear mildly prominent measuring  up to 2.6 cm in diameter. Bowel wall thickening may be the result of chronic protein  losing enteropathy, inflammatory bowel disease, or edema from hypoproteinemia. 4. There is a new exophytic lesion arising off the anterolateral cortex of the left kidney measuring 0.9 cm. This exhibits signs of internal enhancement noted with Hounsfield units up to 165. This may reflect a small renal cell carcinoma. Consider further evaluation with nonemergent renal protocol MRI. 5. Ground-glass opacities and atelectasis noted within the lung bases, left greater than right. 6. Stable appearance of previously characterized liver lesions. 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 On the prior exam from 09/03/2016 this measured 5.6 x 4.0 cm. Critical Value/emergent results were called by telephone at the time of interpretation on 10/29/2023 at 6:38 am to provider [facility] , who verbally acknowledged these results. Electronically Signed   By: Signa Kell M.D.   On: 10/29/2023 06:38    Anti-infectives: Anti-infectives (From admission, onward)    Start     Dose/Rate Route Frequency Ordered Stop   10/29/23 1800  piperacillin-tazobactam (ZOSYN) IVPB 3.375 g        3.375 g 12.5 mL/hr over 240 Minutes Intravenous Every 8 hours 10/29/23 0918     10/29/23 0930  piperacillin-tazobactam (ZOSYN) IVPB 3.375 g        3.375 g 100 mL/hr over 30 Minutes Intravenous STAT 10/29/23 0918 10/29/23 1005   10/29/23 0615  ceFEPIme (MAXIPIME) 2 g in sodium chloride 0.9 % 100 mL IVPB        2 g 200 mL/hr over 30 Minutes Intravenous  Once 10/29/23 0610 10/29/23 0703   10/29/23 0615  metroNIDAZOLE (FLAGYL) IVPB 500 mg        500 mg 100 mL/hr over 60 Minutes Intravenous  Once 10/29/23 0610 10/29/23 0825       Assessment/Plan:  43 year old woman with history of chronic intestinal pseudoobstruction who presented with perforated viscus and is now status post extended right colectomy for perforated transverse colon with proximal mesenteric  mass 10/29/2023 -Continue NG to suction, strict n.p.o.  Intestinal discontinuity -Continue VAC dressing -Continue broad-spectrum IV antibiotics, fluid resuscitation, supportive care -Anticipate return to OR tomorrow with Dr. Andrey Campanile for reassessment and hopeful reanastomosis/closure.  I discussed intraoperative findings and plans going forward with her mother and father at the bedside this morning.   LOS: 1 day    Berna Bue 10/30/2023

## 2023-10-30 NOTE — H&P (View-Only) (Signed)
 1 Day Post-Op   Subjective/Chief Complaint: No acute events. On 20 of neo.    Objective: Vital signs in last 24 hours: Temp:  [97.4 F (36.3 C)-99.4 F (37.4 C)] 98.1 F (36.7 C) (03/15 2359) Pulse Rate:  [87-117] 97 (03/16 0815) Resp:  [15-28] 18 (03/16 0815) BP: (88-132)/(58-89) 132/89 (03/15 1700) SpO2:  [92 %-100 %] 100 % (03/16 0815) Arterial Line BP: (90-136)/(48-78) 90/48 (03/16 0815) FiO2 (%):  [40 %-100 %] 50 % (03/16 0752) Weight:  [50 kg-51.2 kg] 51.2 kg (03/16 0703)    Intake/Output from previous day: 03/15 0701 - 03/16 0700 In: 7275 [I.V.:4631.9; Blood:315; IV Piggyback:2328.1] Out: 700 [Urine:100; Drains:500; Blood:100] Intake/Output this shift: Total I/O In: 484.5 [I.V.:248.4; IV Piggyback:236.1] Out: 1200 [Urine:450; Emesis/NG output:450; Drains:300]  Intubated, sedated Regular rate and rhythm, low-dose neo requirement due to sedation Abdomen is soft, tender, VAC in place with good seal, murky appearing serosanguineous output in canister which nurse reports is the second canister since surgery Clear yellow urine in Foley bag   Lab Results:  Recent Labs    10/29/23 1846 10/30/23 0728  WBC 4.8 12.7*  HGB 9.7* 11.0*  HCT 30.7* 35.1*  PLT 270 288   BMET Recent Labs    10/29/23 1846 10/30/23 0435  NA 141 140  K 3.5 3.5  CL 109 109  CO2 23 22  GLUCOSE 115* 110*  BUN 27* 27*  CREATININE 0.72 0.69  CALCIUM 8.4* 8.4*   PT/INR Recent Labs    10/29/23 0635  LABPROT 17.3*  INR 1.4*   ABG Recent Labs    10/29/23 1523 10/29/23 1828  PHART 7.310* 7.35  HCO3 20.3 24.8    Studies/Results: [place] EKG SITE RITE Result Date: 10/30/2023 If Site Rite image not attached, placement could not be confirmed due to current cardiac rhythm.  DG Abd 1 View Result Date: 10/29/2023 CLINICAL DATA:  Enteric catheter placement, abnormal CT EXAM: ABDOMEN - 1 VIEW COMPARISON:  10/29/2023 FINDINGS: Supine frontal view of the lower chest and upper abdomen was  obtained. Pelvis is excluded by collimation. Enteric catheter passes below diaphragm, tip and side port projecting over the gastric body. Unremarkable bowel gas pattern without evidence of obstruction or ileus. The bowel wall thickening seen on previous CT is again appreciated. The pneumoperitoneum identified on prior CT imaging is not well visualized on this supine exam. There is patchy consolidation at the lung bases, left greater than right. IMPRESSION: 1. Enteric catheter tip projecting over the gastric body. 2. Persistent bowel wall thickening, unchanged since CT. 3. The pneumoperitoneum seen on CT is not well visualized on this supine x-ray. 4. Patchy bibasilar consolidation, left greater than right. Electronically Signed   By: Sharlet Salina M.D.   On: 10/29/2023 17:27   Portable Chest x-ray Result Date: 10/29/2023 CLINICAL DATA:  Intubated EXAM: PORTABLE CHEST 1 VIEW COMPARISON:  10/29/2023 FINDINGS: Single frontal view of the chest demonstrates endotracheal tube overlying tracheal air column, tip 3 cm above carina. Enteric catheter passes below diaphragm, tip excluded by collimation but side port projecting over the gastric body. Cardiac silhouette is stable. Persistent patchy consolidation at the lung bases, left greater than right. No effusion or pneumothorax. No acute bony abnormalities. IMPRESSION: 1. Support devices as above. 2. Stable bibasilar consolidation, left greater than right. Electronically Signed   By: Sharlet Salina M.D.   On: 10/29/2023 17:18   DG Chest Port 1 View Result Date: 10/29/2023 CLINICAL DATA:  Sepsis.  Severe abdominal pain. EXAM: PORTABLE CHEST 1  VIEW COMPARISON:  CT AP from earlier today. FINDINGS: Normal cardiomediastinal contours. Decreased lung volumes. Asymmetric opacity within the left lower lung identified concerning for atelectasis and or pneumonia. Right lung appears clear. IMPRESSION: Asymmetric opacity within the left lower lung concerning for atelectasis and/or  pneumonia. Electronically Signed   By: Signa Kell M.D.   On: 10/29/2023 07:53   CT ABDOMEN PELVIS W CONTRAST Result Date: 10/29/2023 CLINICAL DATA:  Abdominal pain after waking up in the middle of the night. EXAM: CT ABDOMEN AND PELVIS WITH CONTRAST TECHNIQUE: Multidetector CT imaging of the abdomen and pelvis was performed using the standard protocol following bolus administration of intravenous contrast. RADIATION DOSE REDUCTION: This exam was performed according to the departmental dose-optimization program which includes automated exposure control, adjustment of the mA and/or kV according to patient size and/or use of iterative reconstruction technique. CONTRAST:  OMNIPAQUE IOHEXOL 300 MG/ML  SOLN COMPARISON:  CT 04/26/2020, CT 04/25/2019, MRI 09/03/2016. FINDINGS: Lower chest: Ground-glass opacities in atelectasis noted within the lung bases, left greater than right. No pleural effusion identified Hepatobiliary: Previously characterized lesion within the posterolateral right lobe of liver is again seen measuring 1.4 cm. 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 Caudate lobe of liver hemangioma is also unchanged measuring 1.9 cm, image 23/2. 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 On the prior exam from 09/03/2016 this measured 5.6 x 4.0 cm. The gallbladder appears mildly distended. There is no wall thickening or kidney stones identified. Common bile duct is mildly increased in caliber with mild intrahepatic biliary dilatation measuring 9 mm. Pancreas: Unremarkable. No pancreatic ductal dilatation or surrounding inflammatory changes. Spleen: Normal in size without focal abnormality. Adrenals/Urinary Tract: There is a an exophytic lesion arising off the anterolateral cortex of the left kidney measuring 0.9 cm. Signs of internal enhancement noted with Hounsfield units is I is 165, image 35/2. No  additional focal kidney lesions. No nephrolithiasis or hydronephrosis. Bladder is mildly distended and there is gas within the non dependent portion of the bladder. Stomach/Bowel: Stomach appears normal. There is bowel wall thickening/edema with mucosal enhancement involving the small and large bowel loops. This appears to be a chronic abnormality and has been reported across multiple studies. No signs of pneumatosis. The small bowel loops appear mildly prominent measuring up to 2.6 cm in diameter. The appendix is suboptimally visualized. Vascular/Lymphatic: Patent abdominal aorta and upper abdominal vascularity. No signs of abdominopelvic adenopathy. There is diffuse edema throughout the mesentery. Reproductive: Uterus and bilateral adnexa are unremarkable. Other: Signs of pneumoperitoneum identified. Multiple foci free air noted predominantly within the ventral portions of the abdomen. Etiology is indeterminate but perforated viscus cannot be excluded. Free fluid is identified within the abdomen and pelvis this measures up to 26 Hounsfield units which may reflect underlying hemoperitoneum. Musculoskeletal: Remote healed ninth lateral rib fracture. No acute or suspicious osseous findings. IMPRESSION: 1. Signs of pneumoperitoneum identified. Multiple foci of free air noted predominantly within the ventral portions of the abdomen. Etiology is indeterminate but perforated viscus cannot be excluded. 2. Free fluid is identified within the abdomen and pelvis this measures up to 26 Hounsfield units which may reflect underlying hemoperitoneum versus peritonitis. 3. There is bowel wall thickening/edema with mucosal enhancement involving the small and large bowel loops. This appears to be a chronic abnormality and has been reported across multiple studies. No signs of pneumatosis. The small bowel loops appear mildly prominent measuring  up to 2.6 cm in diameter. Bowel wall thickening may be the result of chronic protein  losing enteropathy, inflammatory bowel disease, or edema from hypoproteinemia. 4. There is a new exophytic lesion arising off the anterolateral cortex of the left kidney measuring 0.9 cm. This exhibits signs of internal enhancement noted with Hounsfield units up to 165. This may reflect a small renal cell carcinoma. Consider further evaluation with nonemergent renal protocol MRI. 5. Ground-glass opacities and atelectasis noted within the lung bases, left greater than right. 6. Stable appearance of previously characterized liver lesions. 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 On the prior exam from 09/03/2016 this measured 5.6 x 4.0 cm. Critical Value/emergent results were called by telephone at the time of interpretation on 10/29/2023 at 6:38 am to provider [facility] , who verbally acknowledged these results. Electronically Signed   By: Signa Kell M.D.   On: 10/29/2023 06:38    Anti-infectives: Anti-infectives (From admission, onward)    Start     Dose/Rate Route Frequency Ordered Stop   10/29/23 1800  piperacillin-tazobactam (ZOSYN) IVPB 3.375 g        3.375 g 12.5 mL/hr over 240 Minutes Intravenous Every 8 hours 10/29/23 0918     10/29/23 0930  piperacillin-tazobactam (ZOSYN) IVPB 3.375 g        3.375 g 100 mL/hr over 30 Minutes Intravenous STAT 10/29/23 0918 10/29/23 1005   10/29/23 0615  ceFEPIme (MAXIPIME) 2 g in sodium chloride 0.9 % 100 mL IVPB        2 g 200 mL/hr over 30 Minutes Intravenous  Once 10/29/23 0610 10/29/23 0703   10/29/23 0615  metroNIDAZOLE (FLAGYL) IVPB 500 mg        500 mg 100 mL/hr over 60 Minutes Intravenous  Once 10/29/23 0610 10/29/23 0825       Assessment/Plan:  43 year old woman with history of chronic intestinal pseudoobstruction who presented with perforated viscus and is now status post extended right colectomy for perforated transverse colon with proximal mesenteric  mass 10/29/2023 -Continue NG to suction, strict n.p.o.  Intestinal discontinuity -Continue VAC dressing -Continue broad-spectrum IV antibiotics, fluid resuscitation, supportive care -Anticipate return to OR tomorrow with Dr. Andrey Campanile for reassessment and hopeful reanastomosis/closure.  I discussed intraoperative findings and plans going forward with her mother and father at the bedside this morning.   LOS: 1 day    Berna Bue 10/30/2023

## 2023-10-30 NOTE — TOC Initial Note (Signed)
 Transition of Care [facility]) - Initial/Assessment Note    Patient Details  Name: Stacy [name] MRN: [id number] Date of Birth: 12-14-1980  Transition of [facility]) CM/SW Contact:    Diona Browner, LCSW Phone Number: 10/30/2023, 8:35 AM  Clinical Narrative:                 Pt from home with family. Pt continues medical workup. TOC following for needs.     Barriers to Discharge: Continued Medical Work up   Patient Goals and CMS Choice Patient states their goals for this hospitalization and ongoing recovery are:: return home   Choice offered to / list presented to : NA [place] ownership interest in [facility].provided to::  (na)    Expected Discharge Plan and Services       Living arrangements for the past 2 months: Single Family Home                   DME Agency: NA                  Prior Living Arrangements/Services Living arrangements for the past 2 months: Single Family Home Lives with:: Relatives Patient language and need for interpreter reviewed:: Yes Do you feel safe going back to the place where you live?: Yes        Care giver support system in place?: Yes (comment)   Criminal Activity/Legal Involvement Pertinent to Current Situation/Hospitalization: No - Comment as needed  Activities of Daily Living      Permission Sought/Granted                  Emotional Assessment Appearance:: Appears stated age       Alcohol / Substance Use: Not Applicable Psych Involvement: No (comment)  Admission diagnosis:  Pneumoperitoneum [K66.8] Hypokalemia [E87.6] Acute abdomen [R10.0] Hemoperitoneum [K66.1] Vaginal bleeding [N93.9] Generalized abdominal pain [R10.84] Thickened small bowel [K63.9] Patient Active Problem List   Diagnosis Date Noted   Acute abdomen 10/29/2023   Irregular bleeding 03/24/2023   Adie's tonic pupil, 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   Anxiety 09/25/2020   Chronic idiopathic constipation 09/25/2020   Weight loss  09/25/2020   Adrenal insufficiency (HCC) 09/16/2020   Amenorrhea 07/21/2020   Orthostatic hypotension    Hypophosphatemia    Hypomagnesemia    Bloating    Nausea and vomiting 04/28/2020   Failure to thrive in adult 04/27/2020   Physical exam 01/01/2020   Generalized abdominal pain 04/25/2019   Hyponatremia 04/25/2019   Hypokalemia 04/25/2019   Severe protein-calorie malnutrition (HCC) 04/25/2019   Gastroesophageal reflux disease 04/07/2019   Normocytic anemia 04/07/2019   Chronic intestinal pseudo-obstruction 09/05/2018   Iron deficiency anemia 01/22/2018   Vitamin B deficiency 01/22/2018   Vitamin D deficiency 01/22/2018   Underweight 06/27/2017   Cyst of ovary 10/05/2016   Hepatic adenoma 07/27/2013   PCP:  [name], MD Pharmacy:   [facility] [phone] - [place], [place] - [address] AT [facility] [address] [place] [address] Phone: [phone] Fax: [phone]     Social Drivers of Health (SDOH) Social History: SDOH Screenings   Food Insecurity: No Food Insecurity (10/30/2023)  Housing: Low Risk  (10/30/2023)  Transportation Needs: Patient Unable To Answer (10/30/2023)  Utilities: Patient Unable To Answer (10/30/2023)  Alcohol Screen: Low Risk  (06/09/2023)  Depression (PHQ2-9): Low Risk  (05/17/2023)  Financial Resource Strain: Low Risk  (06/09/2023)  Physical Activity:  Insufficiently Active (06/09/2023)  Social Connections: Moderately Integrated (06/09/2023)  Stress: No Stress Concern Present (06/09/2023)  Tobacco Use: Low Risk  (09/29/2023)   SDOH Interventions:     Readmission Risk Interventions    10/30/2023    8:34 AM  Readmission Risk Prevention Plan  Transportation Screening Complete  PCP or Specialist Appt within 5-7 Days Complete  Home Care Screening Complete  Medication Review (RN CM) Complete

## 2023-10-30 NOTE — Progress Notes (Signed)
 [facility] ADULT ICU REPLACEMENT PROTOCOL   The patient does apply for the [facility] Adult ICU Electrolyte Replacment Protocol based on the criteria listed below:   1.Exclusion criteria: TCTS, ECMO, Dialysis, and Myasthenia Gravis patients 2. Is GFR >/= 30 ml/min? Yes.    Patient's GFR today is >60 3. Is SCr </= 2? Yes.   Patient's SCr is 0.69 mg/dL 4. Did SCr increase >/= 0.5 in 24 hours? No. 5.Pt's weight >40kg  Yes.   6. Abnormal electrolyte(s): K  7. Electrolytes replaced per protocol 8.  Call MD STAT for K+ </= 2.5, Phos </= 1, or Mag </= 1 Physician:  Forbes Cellar [facility] 10/30/2023 5:27 AM

## 2023-10-30 NOTE — Progress Notes (Signed)
 PHARMACY - PHYSICIAN COMMUNICATION CRITICAL VALUE ALERT - BLOOD CULTURE IDENTIFICATION (BCID)  Stacy [name] is an 43 y.o. female who presented to [facility] on 10/29/2023 with a chief complaint of  Chief Complaint  Patient presents with   Abdominal Pain     Assessment: Perforated viscus, peritonitis, sepsis. 1/4 bottles showing Staph species   Name of physician (or Provider) Contacted:  Dr. Vassie Loll   Current antibiotics: Zosyn  Changes to prescribed antibiotics recommended:  Patient is on recommended antibiotics - No changes needed - for IAI   Results for orders placed or performed during the hospital encounter of 10/29/23  Blood Culture ID Panel (Reflexed) (Collected: 10/29/2023  6:30 AM)  Result Value Ref Range   Enterococcus faecalis NOT DETECTED NOT DETECTED   Enterococcus Faecium NOT DETECTED NOT DETECTED   Listeria monocytogenes NOT DETECTED NOT DETECTED   Staphylococcus species DETECTED (A) NOT DETECTED   Staphylococcus aureus (BCID) NOT DETECTED NOT DETECTED   Staphylococcus epidermidis NOT DETECTED NOT DETECTED   Staphylococcus lugdunensis NOT DETECTED NOT DETECTED   Streptococcus species NOT DETECTED NOT DETECTED   Streptococcus agalactiae NOT DETECTED NOT DETECTED   Streptococcus pneumoniae NOT DETECTED NOT DETECTED   Streptococcus pyogenes NOT DETECTED NOT DETECTED   A.calcoaceticus-baumannii NOT DETECTED NOT DETECTED   Bacteroides fragilis NOT DETECTED NOT DETECTED   Enterobacterales NOT DETECTED NOT DETECTED   Enterobacter cloacae complex NOT DETECTED NOT DETECTED   Escherichia coli NOT DETECTED NOT DETECTED   Klebsiella aerogenes NOT DETECTED NOT DETECTED   Klebsiella oxytoca NOT DETECTED NOT DETECTED   Klebsiella pneumoniae NOT DETECTED NOT DETECTED   Proteus species NOT DETECTED NOT DETECTED   Salmonella species NOT DETECTED NOT DETECTED   Serratia marcescens NOT DETECTED NOT DETECTED   Haemophilus influenzae NOT DETECTED NOT DETECTED   Neisseria  meningitidis NOT DETECTED NOT DETECTED   Pseudomonas aeruginosa NOT DETECTED NOT DETECTED   Stenotrophomonas maltophilia NOT DETECTED NOT DETECTED   Candida albicans NOT DETECTED NOT DETECTED   Candida auris NOT DETECTED NOT DETECTED   Candida glabrata NOT DETECTED NOT DETECTED   Candida krusei NOT DETECTED NOT DETECTED   Candida parapsilosis NOT DETECTED NOT DETECTED   Candida tropicalis NOT DETECTED NOT DETECTED   Cryptococcus neoformans/gattii NOT DETECTED NOT DETECTED     Adalberto Cole, PharmD, BCPS 10/30/2023 8:41 AM

## 2023-10-30 NOTE — Progress Notes (Addendum)
 NAME:  Stacy [name], MRN:  [id number], DOB:  April 05, 1981, LOS: 1 ADMISSION DATE:  10/29/2023, CONSULTATION DATE:  10/30/2023  REFERRING MD:  Nicanor Alcon, EDP, CHIEF COMPLAINT: Abdominal pain  History of Present Illness:  43 year old woman with a history of chronic intestinal pseudoobstruction presented with severe abdominal pain that started around midnight.  She was hypotensive initially responding to IV fluids. Labs showed normal lactate, potassium 2.0, mild leukopenia 3.8, chronic anemia 10.8 CT abdomen showed pneumoperitoneum with free fluid in the abdomen, bowel thickening/edema small and large bowel which was chronic without pneumatosis, left kidney lesion 0.9 cm, stable liver lesions Seen by surgery and plan is to take to the OR  Pertinent  Medical History  B12 and iron deficiency Protein calorie malnutrition Chronic hypotension -on midodrine and fludrocortisone  Significant Hospital Events: Including procedures, antibiotic start and stop dates in addition to other pertinent events   3/15 exploratory laparotomy >> perforation at mid transverse colon, mesenteric mass and proximal transverse colon, diffuse severe purulent peritonitis >> extended right hemicolectomy, wound VAC  Interim History / Subjective:   Brought back to ICU intubated sedated, on low-dose Neo-Synephrine. Critically ill Low urine output, 6 L positive  Objective   Blood pressure 132/89, pulse 97, temperature 98.1 F (36.7 C), temperature source Axillary, resp. rate 18, height 5\' 3"  (1.6 m), weight 51.2 kg, SpO2 100%.    Vent Mode: PRVC FiO2 (%):  [40 %-100 %] 50 % Set Rate:  [18 bmp] 18 bmp Vt Set:  [410 mL] 410 mL PEEP:  [5 cmH20] 5 cmH20 Plateau Pressure:  [10 cmH20-19 cmH20] 11 cmH20   Intake/Output Summary (Last 24 hours) at 10/30/2023 4098 Last data filed at 10/30/2023 1191 Gross per 24 hour  Intake 6564.53 ml  Output 1900 ml  Net 4664.53 ml   Filed Weights   10/29/23 1700 10/30/23 0703   Weight: 50 kg 51.2 kg    Examination: General: Thin woman, intubated, HENT: Mild pallor, no icterus, no JVD, dry mucosa Lungs: Bilateral ventilated breath sounds, no accessory muscle use Cardiovascular: S1-S2 regular Abdomen: Soft, tender, midline wound VAC Extremities: No edema, no deformity Neuro: Sedate, RASS 0 to -1, follows one-step commands   Labs show improved hypokalemia 2.0, mild leukocytosis, stable anemia Chest x-ray shows ET tube in position, NG tube in position, no new infiltrates  Resolved Hospital Problem list     Assessment & Plan:   Perforated colon, start post right hemicolectomy, severe peritonitis -Bowel in discontinuity with wound VAC, second look in OR planned 3/17 -Continue Zosyn  Acute respiratory insufficiency postoperative -No spontaneous breathing trials while belly open -Maintain deeper sedation RASS -2 to -3 with propofol/fentanyl  Septic shock Chronic hypotension -maintained on midodrine and Florinef by cardiology, does not carry a formal diagnosis of adrenal insufficiency in fact baseline cortisol was 26 making this less likely Staph in blood 1/4 is likely contaminant -Taper Neo-Synephrine to off -Continue IV fluids, monitor urine output -No need for stress dose steroids here -Place PICC  Hypokalemia -replete IV  Iron deficiency anemia/B12 deficiency with questionable absorption -Supplement as needed   Best Practice (right click and "Reselect all SmartList Selections" daily)   Diet/type: NPO DVT prophylaxis SCD Pressure ulcer(s): N/A GI prophylaxis: N/A Lines: N/A Foley:  N/A Code Status:  full code Last date of multidisciplinary goals of care discussion [NA] family updated at bedside  Labs   CBC: Recent Labs  Lab 10/29/23 0322 10/29/23 1229 10/29/23 1523 10/29/23 1524 10/29/23 1846 10/30/23 0728  WBC 3.8*  --   --   --  4.8 12.7*  NEUTROABS 2.6  --   --   --   --   --   HGB 10.8* 7.1* 7.1* 6.8* 9.7* 11.0*  HCT 34.1*  21.0* 21.0* 20.0* 30.7* 35.1*  MCV 90.2  --   --   --  90.0 91.6  PLT 477*  --   --   --  270 288    Basic Metabolic Panel: Recent Labs  Lab 10/29/23 0322 10/29/23 0818 10/29/23 1229 10/29/23 1523 10/29/23 1524 10/29/23 1846 10/30/23 0435  NA 139  --  144 141 141 141 140  K 2.0*   < > 2.6* 3.1* 3.1* 3.5 3.5  CL 100  --  111  --  109 109 109  CO2 26  --   --   --   --  23 22  GLUCOSE 147*  --  87  --  99 115* 110*  BUN 31*  --  23*  --  23* 27* 27*  CREATININE 0.78  --  0.70  --  0.60 0.72 0.69  CALCIUM 8.4*  --   --   --   --  8.4* 8.4*  MG 2.1  --   --   --   --   --  2.0  PHOS  --   --   --   --   --   --  5.2*   < > = values in this interval not displayed.   GFR: Estimated Creatinine Clearance: 73.3 mL/min (by C-G formula based on SCr of 0.69 mg/dL). Recent Labs  Lab 10/29/23 0322 10/29/23 0639 10/29/23 0843 10/29/23 1846 10/30/23 0728  WBC 3.8*  --   --  4.8 12.7*  LATICACIDVEN  --  1.7 1.6  --   --     Liver Function Tests: Recent Labs  Lab 10/29/23 0322  AST 15  ALT 10  ALKPHOS 52  BILITOT 1.4*  PROT 6.5  ALBUMIN 3.4*   No results for input(s): "LIPASE", "AMYLASE" in the last 168 hours. No results for input(s): "AMMONIA" in the last 168 hours.  ABG    Component Value Date/Time   PHART 7.35 10/29/2023 1828   PCO2ART 45 10/29/2023 1828   PO2ART 275 (H) 10/29/2023 1828   HCO3 24.8 10/29/2023 1828   TCO2 21 (L) 10/29/2023 1524   ACIDBASEDEF 1.3 10/29/2023 1828   O2SAT 100 10/29/2023 1828     Coagulation Profile: Recent Labs  Lab 10/29/23 0635  INR 1.4*    Cardiac Enzymes: No results for input(s): "CKTOTAL", "CKMB", "CKMBINDEX", "TROPONINI" in the last 168 hours.  HbA1C: Hgb A1c MFr Bld  Date/Time Value Ref Range Status  07/28/2013 04:30 AM 5.1 <5.7 % Final    Comment:    (NOTE)                                                                       According to the ADA Clinical Practice Recommendations for 2011, when HbA1c is used as  a screening test:  >=6.5%   Diagnostic of Diabetes Mellitus           (if abnormal result is confirmed) 5.7-6.4%   Increased risk of developing Diabetes Mellitus References:Diagnosis and Classification of Diabetes Mellitus,Diabetes Care,2011,34(Suppl 1):S62-S69 and Standards  of Medical Care in         Diabetes - 2011,Diabetes Care,2011,34 (Suppl 1):S11-S61.    CBG: Recent Labs  Lab 10/29/23 1741 10/29/23 1952 10/29/23 2335 10/30/23 0434  GLUCAP 107* 99 101* 94    My independent critical care time was 35 minutes  Cyril Mourning MD. FCCP. [facility] Pager : [phone]  If no response to pager , please call [phone] until 7 pm After 7:00 pm call Elink  [phone]   10/30/2023

## 2023-10-31 ENCOUNTER — Inpatient Hospital Stay (HOSPITAL_COMMUNITY): Admitting: Anesthesiology

## 2023-10-31 ENCOUNTER — Other Ambulatory Visit: Payer: Self-pay

## 2023-10-31 ENCOUNTER — Encounter (HOSPITAL_COMMUNITY): Payer: Self-pay | Admitting: Surgery

## 2023-10-31 ENCOUNTER — Encounter (HOSPITAL_COMMUNITY)
Admission: EM | Disposition: A | Discharge status: Home Health | Payer: Self-pay | Source: Home / Self Care | Attending: Family Medicine

## 2023-10-31 DIAGNOSIS — F419 Anxiety disorder, unspecified: Secondary | ICD-10-CM

## 2023-10-31 DIAGNOSIS — S31109A Unspecified open wound of abdominal wall, unspecified quadrant without penetration into peritoneal cavity, initial encounter: Secondary | ICD-10-CM | POA: Diagnosis not present

## 2023-10-31 DIAGNOSIS — R100 Acute abdomen: Secondary | ICD-10-CM | POA: Diagnosis not present

## 2023-10-31 DIAGNOSIS — A419 Sepsis, unspecified organism: Secondary | ICD-10-CM | POA: Diagnosis not present

## 2023-10-31 DIAGNOSIS — R6521 Severe sepsis with septic shock: Secondary | ICD-10-CM | POA: Diagnosis not present

## 2023-10-31 LAB — CBC WITH DIFFERENTIAL/PLATELET
Abs Immature Granulocytes: 0.05 10*3/uL (ref 0.00–0.07)
Basophils Absolute: 0 10*3/uL (ref 0.0–0.1)
Basophils Relative: 0 %
Eosinophils Absolute: 0 10*3/uL (ref 0.0–0.5)
Eosinophils Relative: 0 %
HCT: 35.7 % — ABNORMAL LOW (ref 36.0–46.0)
Hemoglobin: 11 g/dL — ABNORMAL LOW (ref 12.0–15.0)
Immature Granulocytes: 0 %
Lymphocytes Relative: 10 %
Lymphs Abs: 1.4 10*3/uL (ref 0.7–4.0)
MCH: 28.5 pg (ref 26.0–34.0)
MCHC: 30.8 g/dL (ref 30.0–36.0)
MCV: 92.5 fL (ref 80.0–100.0)
Monocytes Absolute: 0.4 10*3/uL (ref 0.1–1.0)
Monocytes Relative: 3 %
Neutro Abs: 11.4 10*3/uL — ABNORMAL HIGH (ref 1.7–7.7)
Neutrophils Relative %: 87 %
Platelets: 324 10*3/uL (ref 150–400)
RBC: 3.86 MIL/uL — ABNORMAL LOW (ref 3.87–5.11)
RDW: 14.8 % (ref 11.5–15.5)
WBC: 13.2 10*3/uL — ABNORMAL HIGH (ref 4.0–10.5)
nRBC: 0 % (ref 0.0–0.2)

## 2023-10-31 LAB — GLUCOSE, CAPILLARY
Glucose-Capillary: 107 mg/dL — ABNORMAL HIGH (ref 70–99)
Glucose-Capillary: 108 mg/dL — ABNORMAL HIGH (ref 70–99)
Glucose-Capillary: 130 mg/dL — ABNORMAL HIGH (ref 70–99)
Glucose-Capillary: 59 mg/dL — ABNORMAL LOW (ref 70–99)
Glucose-Capillary: 62 mg/dL — ABNORMAL LOW (ref 70–99)
Glucose-Capillary: 64 mg/dL — ABNORMAL LOW (ref 70–99)
Glucose-Capillary: 82 mg/dL (ref 70–99)
Glucose-Capillary: 91 mg/dL (ref 70–99)
Glucose-Capillary: 94 mg/dL (ref 70–99)
Glucose-Capillary: 94 mg/dL (ref 70–99)

## 2023-10-31 LAB — CORTISOL: Cortisol, Plasma: 9.5 ug/dL

## 2023-10-31 LAB — CULTURE, BLOOD (ROUTINE X 2)

## 2023-10-31 LAB — BASIC METABOLIC PANEL
Anion gap: 7 (ref 5–15)
BUN: 19 mg/dL (ref 6–20)
CO2: 23 mmol/L (ref 22–32)
Calcium: 8.3 mg/dL — ABNORMAL LOW (ref 8.9–10.3)
Chloride: 114 mmol/L — ABNORMAL HIGH (ref 98–111)
Creatinine, Ser: 0.61 mg/dL (ref 0.44–1.00)
GFR, Estimated: >60 mL/min (ref >60)
Glucose, Bld: 75 mg/dL (ref 70–99)
Potassium: 4.1 mmol/L (ref 3.5–5.1)
Sodium: 144 mmol/L (ref 135–145)

## 2023-10-31 LAB — PHOSPHORUS: Phosphorus: 3.7 mg/dL (ref 2.5–4.6)

## 2023-10-31 LAB — MAGNESIUM: Magnesium: 2 mg/dL (ref 1.7–2.4)

## 2023-10-31 LAB — TRIGLYCERIDES: Triglycerides: 793 mg/dL — ABNORMAL HIGH (ref <150)

## 2023-10-31 SURGERY — LAPAROTOMY, EXPLORATORY
Anesthesia: General

## 2023-10-31 MED ORDER — DEXTROSE 50 % IV SOLN
INTRAVENOUS | Status: AC
  Administered 2023-10-31: 50 mL
  Filled 2023-10-31: qty 50

## 2023-10-31 MED ORDER — DEXTROSE 10 % IV SOLN: INTRAVENOUS | Status: AC

## 2023-10-31 MED ORDER — SODIUM CHLORIDE 0.9% FLUSH
10.0000 mL | INTRAVENOUS | Status: DC | PRN
  Administered 2023-11-05 – 2023-11-15 (×3): 10 mL

## 2023-10-31 MED ORDER — FENTANYL CITRATE PF 50 MCG/ML IJ SOSY
12.5000 ug | PREFILLED_SYRINGE | INTRAMUSCULAR | Status: DC | PRN
  Administered 2023-11-01 (×2): 25 ug via INTRAVENOUS
  Filled 2023-10-31 (×3): qty 1

## 2023-10-31 MED ORDER — DEXTROSE 50 % IV SOLN: 12.5000 g | Freq: Once | INTRAVENOUS | Status: AC

## 2023-10-31 MED ORDER — DEXMEDETOMIDINE HCL IN NACL 200 MCG/50ML IV SOLN
0.0000 ug/kg/h | INTRAVENOUS | Status: DC
  Administered 2023-10-31: 0.4 ug/kg/h via INTRAVENOUS
  Filled 2023-10-31: qty 50

## 2023-10-31 MED ORDER — ROCURONIUM BROMIDE 100 MG/10ML IV SOLN
INTRAVENOUS | Status: DC | PRN
  Administered 2023-10-31: 40 mg via INTRAVENOUS

## 2023-10-31 MED ORDER — FENTANYL CITRATE (PF) 250 MCG/5ML IJ SOLN
INTRAMUSCULAR | Status: DC | PRN
  Administered 2023-10-31 (×2): 100 ug via INTRAVENOUS

## 2023-10-31 MED ORDER — 0.9 % SODIUM CHLORIDE (POUR BTL) OPTIME
TOPICAL | Status: DC | PRN
  Administered 2023-10-31 (×4): 1000 mL

## 2023-10-31 MED ORDER — SODIUM CHLORIDE 0.9 % IV SOLN
100.0000 mg | Freq: Every day | INTRAVENOUS | Status: DC
  Administered 2023-10-31 – 2023-11-07 (×8): 100 mg via INTRAVENOUS
  Filled 2023-10-31 (×8): qty 5

## 2023-10-31 MED ORDER — ROCURONIUM BROMIDE 10 MG/ML (PF) SYRINGE
PREFILLED_SYRINGE | INTRAVENOUS | Status: AC
  Filled 2023-10-31: qty 10

## 2023-10-31 MED ORDER — SUGAMMADEX SODIUM 200 MG/2ML IV SOLN
INTRAVENOUS | Status: DC | PRN
  Administered 2023-10-31: 200 mg via INTRAVENOUS

## 2023-10-31 MED ORDER — MIDAZOLAM HCL 2 MG/2ML IJ SOLN
INTRAMUSCULAR | Status: AC
  Filled 2023-10-31: qty 2

## 2023-10-31 MED ORDER — SODIUM CHLORIDE 0.9% FLUSH
10.0000 mL | Freq: Two times a day (BID) | INTRAVENOUS | Status: DC
  Administered 2023-10-31 – 2023-11-02 (×5): 10 mL
  Administered 2023-11-03: 30 mL
  Administered 2023-11-03 – 2023-11-13 (×13): 10 mL
  Administered 2023-11-14: 30 mL

## 2023-10-31 MED ORDER — PROPOFOL 10 MG/ML IV BOLUS
INTRAVENOUS | Status: AC
  Filled 2023-10-31: qty 20

## 2023-10-31 MED ORDER — FENTANYL CITRATE PF 50 MCG/ML IJ SOSY: 25.0000 ug | PREFILLED_SYRINGE | INTRAMUSCULAR | Status: DC | PRN

## 2023-10-31 MED ORDER — LACTATED RINGERS IV SOLN: INTRAVENOUS | Status: DC | PRN

## 2023-10-31 MED ORDER — ORAL CARE MOUTH RINSE: 15.0000 mL | OROMUCOSAL | Status: DC | PRN

## 2023-10-31 MED ORDER — ONDANSETRON HCL 4 MG/2ML IJ SOLN
INTRAMUSCULAR | Status: AC
  Filled 2023-10-31: qty 2

## 2023-10-31 MED ORDER — ACETAMINOPHEN 10 MG/ML IV SOLN
1000.0000 mg | Freq: Four times a day (QID) | INTRAVENOUS | Status: AC
  Administered 2023-10-31 – 2023-11-02 (×6): 1000 mg via INTRAVENOUS
  Filled 2023-10-31 (×7): qty 100

## 2023-10-31 MED ORDER — FENTANYL CITRATE (PF) 100 MCG/2ML IJ SOLN
INTRAMUSCULAR | Status: AC
  Filled 2023-10-31: qty 2

## 2023-10-31 MED ORDER — SUGAMMADEX SODIUM 200 MG/2ML IV SOLN
INTRAVENOUS | Status: AC
  Filled 2023-10-31: qty 2

## 2023-10-31 MED ORDER — DEXAMETHASONE SODIUM PHOSPHATE 10 MG/ML IJ SOLN
INTRAMUSCULAR | Status: AC
  Filled 2023-10-31: qty 1

## 2023-10-31 MED ORDER — LIDOCAINE HCL (PF) 2 % IJ SOLN
INTRAMUSCULAR | Status: AC
Start: 2023-10-31 — End: ?
  Filled 2023-10-31: qty 5

## 2023-10-31 MED ORDER — FENTANYL CITRATE (PF) 250 MCG/5ML IJ SOLN
INTRAMUSCULAR | Status: AC
  Filled 2023-10-31: qty 5

## 2023-10-31 MED ORDER — FAMOTIDINE IN NACL 20-0.9 MG/50ML-% IV SOLN
20.0000 mg | Freq: Two times a day (BID) | INTRAVENOUS | Status: DC
  Administered 2023-10-31: 20 mg via INTRAVENOUS
  Filled 2023-10-31: qty 50

## 2023-10-31 MED ORDER — FENTANYL CITRATE PF 50 MCG/ML IJ SOSY
25.0000 ug | PREFILLED_SYRINGE | INTRAMUSCULAR | Status: DC | PRN
  Filled 2023-10-31: qty 2

## 2023-10-31 SURGICAL SUPPLY — 51 items
BAG COUNTER SPONGE SURGICOUNT (BAG) IMPLANT
BLADE EXTENDED COATED 6.5IN (ELECTRODE) ×1 IMPLANT
BNDG GAUZE DERMACEA FLUFF 4 (GAUZE/BANDAGES/DRESSINGS) IMPLANT
CELLS DAT CNTRL 66122 CELL SVR (MISCELLANEOUS) IMPLANT
CLIP TI LARGE 6 (CLIP) IMPLANT
CLIP TI MEDIUM 6 (CLIP) IMPLANT
CLIP VESOCCLUDE LG 6/CT (CLIP) IMPLANT
COVER SURGICAL LIGHT HANDLE (MISCELLANEOUS) ×1 IMPLANT
DRAIN CHANNEL 19F RND (DRAIN) IMPLANT
DRAPE LAPAROSCOPIC ABDOMINAL (DRAPES) IMPLANT
DRSG OPSITE POSTOP 4X6 (GAUZE/BANDAGES/DRESSINGS) IMPLANT
DRSG OPSITE POSTOP 4X8 (GAUZE/BANDAGES/DRESSINGS) IMPLANT
ELECT REM PT RETURN 15FT ADLT (MISCELLANEOUS) ×1 IMPLANT
EVACUATOR SILICONE 100CC (DRAIN) ×1 IMPLANT
GAUZE PAD ABD 8X10 STRL (GAUZE/BANDAGES/DRESSINGS) IMPLANT
GAUZE SPONGE 4X4 12PLY STRL (GAUZE/BANDAGES/DRESSINGS) ×1 IMPLANT
GLOVE BIO SURGEON STRL SZ7.5 (GLOVE) ×1 IMPLANT
GLOVE INDICATOR 8.0 STRL GRN (GLOVE) ×1 IMPLANT
GOWN SPEC L4 XLG W/TWL (GOWN DISPOSABLE) ×1 IMPLANT
KIT TURNOVER KIT A (KITS) IMPLANT
LEGGING LITHOTOMY PAIR STRL (DRAPES) IMPLANT
LIGASURE IMPACT 36 18CM CVD LR (INSTRUMENTS) IMPLANT
NS IRRIG 1000ML POUR BTL (IV SOLUTION) ×2 IMPLANT
PACK GENERAL/GYN (CUSTOM PROCEDURE TRAY) ×1 IMPLANT
RELOAD STAPLE 60 3.6 BLU REG (STAPLE) IMPLANT
RELOAD STAPLER BLUE 60MM (STAPLE) ×1 IMPLANT
RETRACTOR WND ALEXIS 18 MED (MISCELLANEOUS) IMPLANT
RETRACTOR WND ALEXIS 25 LRG (MISCELLANEOUS) IMPLANT
RETRACTOR WOUND ALXS 25CM LRG (MISCELLANEOUS) ×1 IMPLANT
RTRCTR WOUND ALEXIS 18CM MED (MISCELLANEOUS) IMPLANT
RTRCTR WOUND ALEXIS 25CM LRG (MISCELLANEOUS) ×1 IMPLANT
SHEARS FOC LG CVD HARMONIC 17C (MISCELLANEOUS) IMPLANT
SHEARS HARMONIC 36 ACE (MISCELLANEOUS) IMPLANT
STAPLE ECHEON FLEX 60 POW ENDO (STAPLE) IMPLANT
STAPLER RELOAD BLUE 60MM (STAPLE) ×1 IMPLANT
STAPLER SKIN PROX WIDE 3.9 (STAPLE) ×1 IMPLANT
SUT NOVA NAB DX-16 0-1 5-0 T12 (SUTURE) IMPLANT
SUT PDS AB 1 TP1 96 (SUTURE) IMPLANT
SUT PDS AB 3-0 PS2 18 (SUTURE) IMPLANT
SUT PDS AB 3-0 SH 27 (SUTURE) ×1 IMPLANT
SUT PDS AB 4-0 SH 27 (SUTURE) IMPLANT
SUT PROLENE 2 0 BLUE (SUTURE) IMPLANT
SUT SILK 2 0 SH CR/8 (SUTURE) ×2 IMPLANT
SUT SILK 2 0SH CR/8 30 (SUTURE) IMPLANT
SUT SILK 2-0 18XBRD TIE 12 (SUTURE) ×2 IMPLANT
SUT SILK 2-0 30XBRD TIE 12 (SUTURE) IMPLANT
SUT SILK 3 0 SH CR/8 (SUTURE) ×2 IMPLANT
SUT SILK 3-0 18XBRD TIE 12 (SUTURE) ×2 IMPLANT
TAPE CLOTH SURG 4X10 WHT LF (GAUZE/BANDAGES/DRESSINGS) IMPLANT
TOWEL OR 17X26 10 PK STRL BLUE (TOWEL DISPOSABLE) ×1 IMPLANT
TRAY FOLEY MTR SLVR 16FR STAT (SET/KITS/TRAYS/PACK) ×1 IMPLANT

## 2023-10-31 NOTE — Procedures (Signed)
 Extubation Procedure Note  Patient Details:   Name: Stacy [name] DOB: October 22, 1980 MRN: [id number]   Airway Documentation:    Vent end date: 10/31/23 Vent end time: 1825   Evaluation  O2 sats: stable throughout Complications: No apparent complications Patient did tolerate procedure well. Bilateral Breath Sounds: Clear, Diminished   Yes  Patient tolerated wean. Positive for cuff leak. Patient extubated to a 4 Lpm [place]. No signs of dyspnea or stridor noted. RN at bedside. Patient resting comfortably.   Ancil Boozer 10/31/2023, 7:06 PM

## 2023-10-31 NOTE — Anesthesia Postprocedure Evaluation (Signed)
 Anesthesia Post Note  Patient: Stacy [name]  Procedure(s) Performed: RE-EXPLORATION OF ABDOMEN, CHOLECYSTECTOMY, ILEOCOLONIC ANASTOMOSIS     Patient location during evaluation: ICU Anesthesia Type: General Level of consciousness: patient remains intubated per anesthesia plan Pain management: pain level controlled Vital Signs Assessment: post-procedure vital signs reviewed and stable Respiratory status: patient remains intubated per anesthesia plan Cardiovascular status: blood pressure returned to baseline and stable Postop Assessment: no apparent nausea or vomiting Anesthetic complications: no  No notable events documented.  Last Vitals:  Vitals:   10/31/23 1201 10/31/23 1512  BP:    Pulse: 96 85  Resp: 19 18  Temp: 37.7 C   SpO2: 100% 100%    Last Pain:  Vitals:   10/29/23 2359  TempSrc: Axillary  PainSc:                  Denell Cothern L Majorie Santee

## 2023-10-31 NOTE — Progress Notes (Signed)
 Peripherally Inserted Central Catheter Placement  The IV Nurse has discussed with the patient and/or persons authorized to consent for the patient, the purpose of this procedure and the potential benefits and risks involved with this procedure.  The benefits include less needle sticks, lab draws from the catheter, and the patient may be discharged home with the catheter. Risks include, but not limited to, infection, bleeding, blood clot (thrombus formation), and puncture of an artery; nerve damage and irregular heartbeat and possibility to perform a PICC exchange if needed/ordered by physician.  Alternatives to this procedure were also discussed.  Bard Power PICC patient education guide, fact sheet on infection prevention and patient information card has been provided to patient /or left at bedside.    PICC Placement Documentation  PICC Triple Lumen 10/31/23 Right Basilic 37 cm 0 cm (Active)  Indication for Insertion or Continuance of Line Administration of hyperosmolar/irritating solutions (i.e. TPN, Vancomycin, etc.) 10/31/23 1620  Exposed Catheter (cm) 0 cm 10/31/23 1620  Site Assessment Clean, Dry, Intact 10/31/23 1620  Lumen #1 Status Flushed;Saline locked;Blood return noted 10/31/23 1620  Lumen #2 Status Flushed;Saline locked;Blood return noted 10/31/23 1620  Lumen #3 Status Flushed;Saline locked;Blood return noted 10/31/23 1620  Dressing Type Transparent;Securing device 10/31/23 1620  Dressing Status Antimicrobial disc/dressing in place;Clean, Dry, Intact 10/31/23 1620  Line Care Connections checked and tightened 10/31/23 1620  Line Adjustment (NICU/IV Team Only) No 10/31/23 1620  Dressing Intervention New dressing;Adhesive placed at insertion site (IV team only) 10/31/23 1620  Dressing Change Due 11/07/23 10/31/23 1620    Husband signed consent   Maximino Greenland 10/31/2023, 4:44 PM

## 2023-10-31 NOTE — Progress Notes (Signed)
 Initial Nutrition Assessment  DOCUMENTATION CODES:   Non-severe (moderate) malnutrition in context of chronic illness  INTERVENTION:  - Plan to start TPN once PICC placed.   - TPN management per Pharmacy.   - Monitor magnesium, potassium, and phosphorus BID for at least 3 days, MD to replete as needed, as pt may be at risk for refeeding syndrome. - Recommend 100mg  thiamine x5 days.    NUTRITION DIAGNOSIS:   Moderate Malnutrition related to chronic illness as evidenced by mild fat depletion, mild muscle depletion.  GOAL:   Patient will meet greater than or equal to 90% of their needs  MONITOR:   Vent status, Labs, Weight trends, I & O's  REASON FOR ASSESSMENT:   Ventilator    ASSESSMENT:   43 year old woman with a PMH of chronic intestinal pseudoobstruction who presented with severe abdominal pain. Admitted for perforated colon.   3/15 Admit; ex-lap, found to have perforation at mid transverse colon, mesenteric mass and proximal transverse colon, s/p extended right hemicolectomy, wound VAC placement, Left in discontinuity, returned to ICU intubated postop  Patient is currently intubated on ventilator support MV: 7.4 L/min Temp (24hrs), Avg:100.4 F (38 C), Min:100 F (37.8 C), Max:100.8 F (38.2 C)  Dad at bedside. He reports patient's UBW to be around 100#. He notes that the patient had been trying to gain weight over the past year.  Per EMR, patient has gained from 92# to 110# over the past year.   Dad reports patient was eating well right up until the severe abdominal pain started that caused her to come to the ED.  Patient typically eats 3 meals a day plus snacks. Often has [place] sausage/bacon, berries, and bread for breakfast, a salad or TV dinner for lunch, and either cooks a meal at home or goes out to eat for dinner. Does not consume any protein supplements at home as she had noticed issues with dairy.  Plan to return to the OR today with CCS.  Per CCM note  today, PICC ordered for TPN.   PICC line will not be placed until after surgery today so TPN to not start today.  Once started, patient will be at risk for refeeding syndrome.    Medications reviewed and include: D10 @ 38mL/hr (provides 408 kcals over 24 hours) Fentanyl Neo @ 20 mcg/min Propofol @ 65mL/hr (provides 396 kcals over 24 hours)  Labs reviewed:  -   NUTRITION - FOCUSED PHYSICAL EXAM:  Flowsheet Row Most Recent Value  Orbital Region Mild depletion  Upper Arm Region Mild depletion  Thoracic and Lumbar Region No depletion  Buccal Region Unable to assess  Temple Region Mild depletion  Clavicle Bone Region Mild depletion  Clavicle and Acromion Bone Region Moderate depletion  Scapular Bone Region Unable to assess  Dorsal Hand No depletion  Patellar Region No depletion  Anterior Thigh Region No depletion  Posterior Calf Region No depletion  Edema (RD Assessment) None  Hair Reviewed  Eyes Reviewed  Mouth Reviewed  Skin Reviewed  Nails Reviewed       Diet Order:   Diet Order             Diet NPO time specified  Diet effective now                   EDUCATION NEEDS:  Education needs have been addressed  Skin:  Skin Assessment: Skin Integrity Issues: Skin Integrity Issues:: Incisions Incisions: Abdomen  Last BM:  PTA  Height:  Ht Readings  from Last 1 Encounters:  10/29/23 5\' 3"  (1.6 m)   Weight:  Wt Readings from Last 1 Encounters:  10/31/23 57.2 kg   BMI:  Body mass index is 22.34 kg/m.  Estimated Nutritional Needs:  Kcal:  1600-1750 kcals Protein:  85-100 grams Fluid:  >/= 1.6L    Shelle Iron RD, LDN Contact via Secure Chat.

## 2023-10-31 NOTE — Progress Notes (Signed)
 Spoke to [facility], stated she will talk to MD about having PICC or CL placed during surgery to day at 1230. She will contact PICC team for updates. [name]

## 2023-10-31 NOTE — Op Note (Signed)
 10/31/2023  2:45 PM  PATIENT:  Stacy [name]  43 y.o. female  PRE-OPERATIVE DIAGNOSIS:  open abdomen; bowel discontinuity; history of exploratory laparotomy, extended right colectomy without anastomosis March 15  POST-OPERATIVE DIAGNOSIS:  same  PROCEDURE:  Procedure(s): RE-EXPLORATION OF ABDOMEN, CHOLECYSTECTOMY, ILEOCOLONIC ANASTOMOSIS  SURGEON:  Surgeon(s): Gaynelle Adu, MD   ASSISTANTS: Berna Bue, MD   ANESTHESIA:   general  DRAINS: Nasogastric Tube and Urinary Catheter (Foley)   LOCAL MEDICATIONS USED:  NONE  SPECIMEN:  Source of Specimen:  gallbladder  EBL: minimal  DISPOSITION OF SPECIMEN:  PATHOLOGY  COUNTS:  YES  INDICATION FOR PROCEDURE: Patient is a 43 year old female who had presented with diffuse peritonitis and free air over the weekend.  She was taken emergently to the operating room where she underwent laparotomy.  There was significant amount of purulent peritonitis.  There was evidence of a small perforation of the mid transverse colon.  She underwent resection of the terminal ileum and right colon.  She was left in discontinuity for a second look.  She was brought back today for reexploration, possible bowel resection, possible cholecystectomy, possible reanastomosis and possible closure.  Risk and benefits were discussed with the husband and documented separately.  The discussion regarding the gallbladder was that it was very distended at the time of her emergency surgery  PROCEDURE: Patient was brought directly from the [facility] long intensive care unit on the ventilator directly to the OR for at [facility].  She was placed on the operating room table.  Her endotracheal tube was connected to the anesthesia circuit and full general endotracheal anesthesia was established.  Sequential compression devices were already in place.  The external ABThera wound VAC dressing was removed.  Her abdomen was then prepped and a combination of ChloraPrep  and Betadine.  She was on scheduled therapeutic antibiotics.  A surgical timeout was performed.  The internal wound VAC sponge was removed.  There was purulent fluid within the abdomen.  The purulent fluid was throughout the abdomen.  We placed a wound protector.  We then placed a Bookwalter.  We identified the distal end of the small bowel that had been stapled and that appeared hemostatic intact.  The transverse colon where it had been resected staple line appeared intact and without any signs of leak.  We then ran the small bowel from the ligament of Treitz all the way to the distal staple line 2 times.  The small bowel was chronically thickened.  There was some mild lymphadenopathy in the small bowel mesentery.  The transverse colon, splenic flexure descending colon sigmoid colon and upper rectum were normal.  The patient had a redundant sigmoid colon.  The uterus and tubes and ovaries appeared normal without any signs of abscess or signs of being the source of her infection.  We ran her small bowel again and looked at the colon and pelvic structures for second time.  The gallbladder was distended.  We decided to remove her gallbladder because of potential challenges in the future with accessing the gallbladder laparoscopically due to adhesions since the majority of her incision was in the upper abdomen and the fact that a looked very distended was concerning for potential acalculous cholecystitis postoperatively.  We utilized a dome down approach to separate the gallbladder from the right hepatic lobe mainly with Bovie electrocautery.  The node of Towanda Malkin was identified.  We incised the peritoneum both medially and laterally with Bovie electrocautery.  The cystic duct and cystic  artery were both identified and dissected out circumferentially.  A large critical view was achieved.  We were able to identify the common hepatic duct and common bile duct.  2 clips were placed on the patient's side of the cystic artery  and 1 distally was then transected with Metzenbaums.  We placed 3 clips on the patient's side of the cystic duct and 1 as it entered the gallbladder and then transected it with Metzenbaum scissors.  This freed the gallbladder.  We then obtained hemostasis from the gallbladder fossa with electrocautery.  There is no evidence of bleeding or bile leak.  We then turned our attention back to the intestine.  We irrigated the abdomen with 4 L of saline.  We then decided to perform an anastomosis between the ileum and the transverse colon.  A side-to-side functional end-to-end anastomosis was performed.  An enterotomy was made just proximal to the small bowel staple line and a colotomy was made just distal to the staple line.  An Echelon 60 stapler with a blue load was then placed through the enterotomy and a colotomy and brought together and fired to create a common channel.  The common defect was then closed with two 3-0 PDS sutures.  A 3-0 silk suture was placed in the crotch of the anastomosis.  The mesenteric defect was quite large and wide open and we did not feel would be appropriate to try to close that.  We then tacked some of the omentum over the anastomosis with a 3-0 silk suture.  We irrigated the abdomen 1 last time.  The fascia was then loose with a running #1 looped PDS 1 from above and 1 from below and tied centrally.  There were a few interrupted #1 Novafil sutures places internal retention sutures.  The skin was left open.  The midline incision was packed with moist gauze and cover with dry gauze.  All needle, instrument, and sponge counts were correct x 2.  There were no immediate complications.  The patient was taken directly back to the ICU.  PLAN OF CARE:  already inpatient  PATIENT DISPOSITION:  ICU - intubated and hemodynamically stable.   Delay start of Pharmacological VTE agent (>24hrs) due to surgical blood loss or risk of bleeding:  no  [name]. Andrey Campanile, MD, FACS General, Bariatric, &  Minimally Invasive Surgery [facility] Surgery, [place]

## 2023-10-31 NOTE — Anesthesia Postprocedure Evaluation (Signed)
 Anesthesia Post Note  Patient: Stacy [name]  Procedure(s) Performed: LAPAROTOMY, EXPLORATORY; RIGHT EXTENDED COLECTOMY (Abdomen)     Patient location during evaluation: ICU Anesthesia Type: General Level of consciousness: sedated Pain management: pain level controlled Vital Signs Assessment: post-procedure vital signs reviewed and stable Respiratory status: patient remains intubated per anesthesia plan Cardiovascular status: stable Postop Assessment: no apparent nausea or vomiting Anesthetic complications: no   No notable events documented.  Last Vitals:  Vitals:   10/30/23 2200 10/30/23 2300  BP:    Pulse: 97 94  Resp: 18 18  Temp: 37.9 C (!) 38 C  SpO2: 100% 100%    Last Pain:  Vitals:   10/29/23 2359  TempSrc: Axillary  PainSc:                  Catheryn Bacon Lajuana Patchell

## 2023-10-31 NOTE — Progress Notes (Signed)
   10/31/23 1244  Vent Select  Invasive or Noninvasive (S)   (Placed vent on standby: pt bagged with full E cylinder, 100% FI02 from ICU 1223 to the OR. ETT stable @ 21 cm.  Pt handed off to the OR staff.)

## 2023-10-31 NOTE — Transfer of Care (Signed)
 Immediate Anesthesia Transfer of Care Note  Patient: Stacy [name]  Procedure(s) Performed: RE-EXPLORATION OF ABDOMEN, CHOLECYSTECTOMY, ILEOCOLONIC ANASTOMOSIS  Patient Location: ICU  Anesthesia Type:General  Level of Consciousness: sedated and Patient remains intubated per anesthesia plan  Airway & Oxygen Therapy: Patient remains intubated per anesthesia plan and Patient placed on Ventilator (see vital sign flow sheet for setting)  Post-op Assessment: Report given to RN and Post -op Vital signs reviewed and stable  Post vital signs: Reviewed and stable  Last Vitals:  Vitals Value Taken Time  BP 149/95   Temp    Pulse 87 10/31/23 1510  Resp 18 10/31/23 1510  SpO2 100 % 10/31/23 1510  Vitals shown include unfiled device data.  Last Pain:  Vitals:   10/29/23 2359  TempSrc: Axillary  PainSc:      Report given and care of patient transferred to Belva Chimes RN    Complications: No notable events documented.

## 2023-10-31 NOTE — Progress Notes (Signed)
 Patient seen upon arrival back to room after surgery s/p Ileocolonic anastomosis, cholecystectomy, and abdominal closure. Free to wean off ventilator as able per surgery. On low dose neo.  Plan Change propofol to precedex Wean off ventilator as able

## 2023-10-31 NOTE — Interval H&P Note (Signed)
 History and Physical Interval Note:  10/31/2023 12:10 PM  Stacy [name]  has presented today for surgery, with the diagnosis of FREE AIR.  The various methods of treatment have been discussed with the patient and family. After consideration of risks, benefits and other options for treatment, the patient has consented to  Procedure(s): LAPAROTOMY, EXPLORATORY (N/A) as a surgical intervention.  The patient's history has been reviewed, patient examined, no change in status, stable for surgery.  I have reviewed the patient's chart and labs.  Questions were answered to the patient's satisfaction.    Reexploration of abdomen, possible bowel resection, possible anastomosis, possible cholecystectomy, possible abdominal closure  Risk and benefits were discussed at the bedside with patient and family member.    I discussed the procedure in detail.  We discussed the risks and benefits of surgery including, but not limited to bleeding, infection (such as wound infection, abdominal abscess), injury to surrounding structures, blood clot formation, urinary retention, incisional hernia, anastomotic stricture, anastomotic leak, anesthesia risks, pulmonary & cardiac complications such as pneumonia &/or heart attack, need for additional procedures, ileus, & prolonged hospitalization.  We discussed the typical postoperative recovery course, including limitations & restrictions postoperatively. I explained that the likelihood of improvement in their symptoms is good.  We did discuss the patient is at increased risk for anastomotic leak compared to a planned scheduled elective surgery but not prohibitive.  Currently not anticipating performing an ileostomy.   Based on Dr. Eustaquio Boyden assessment of the gallbladder during the initial surgery we discussed the possibility of needing to perform cholecystectomy during this reexploration.  We discussed the risk of bile leak, injury to adjacent structures such as the common bile  duct, bleeding and infection.  We discussed that if there is severe inflammation around the gallbladder infundibulum we may manage the gallbladder differently than cholecystectomy during this admission  Stacy [name]

## 2023-10-31 NOTE — Progress Notes (Signed)
 Extubated to nasal cannula earlier following successful, spontaneous breathing trial. Currently appears comfortable on nasal cannula. Hemodynamically stable. Able to tell me her name. Still a little bit weak. Plan  Continuing pulse ox Wean oxygen is able  We have spirometry at the bedside, will start this tonight as she wakes up further  I have added scheduled IV Tylenol an effort to help with narcotic requirements. Her sedation infusions have been off now for over an hour and a half.  Will start off on low-dose as needed, fentanyl in addition to the schedule IV Tylenol

## 2023-10-31 NOTE — Progress Notes (Addendum)
 NAME:  Stacy [name], MRN:  [id number], DOB:  1980/09/04, LOS: 2 ADMISSION DATE:  10/29/2023, CONSULTATION DATE:  10/31/2023  REFERRING MD:  Nicanor Alcon, EDP, CHIEF COMPLAINT: Abdominal pain  History of Present Illness:  43 year old woman with a history of chronic intestinal pseudoobstruction presented with severe abdominal pain that started around midnight.  She was hypotensive initially responding to IV fluids. Labs showed normal lactate, potassium 2.0, mild leukopenia 3.8, chronic anemia 10.8 CT abdomen showed pneumoperitoneum with free fluid in the abdomen, bowel thickening/edema small and large bowel which was chronic without pneumatosis, left kidney lesion 0.9 cm, stable liver lesions Seen by surgery and plan is to take to the OR  Pertinent  Medical History  B12 and iron deficiency Protein calorie malnutrition Chronic hypotension -on midodrine and fludrocortisone  Significant Hospital Events: Including procedures, antibiotic start and stop dates in addition to other pertinent events   3/15 exploratory laparotomy >> perforation at mid transverse colon, mesenteric mass and proximal transverse colon, diffuse severe purulent peritonitis >> extended right hemicolectomy, wound VAC. Left discontinuous. Remains intubated postop 3/17 back to OR today   Interim History / Subjective:  Maxed on propofol and still wakes up with stimulation. Going to the OR soon. Requiring boluses to maintain rass goal even on 200 mcg/min fent. But currently sedated and appears comfortable   Objective   Blood pressure 132/89, pulse 94, temperature (!) 100.8 F (38.2 C), resp. rate 18, height 5\' 3"  (1.6 m), weight 51.2 kg, SpO2 100%.    Vent Mode: PRVC FiO2 (%):  [40 %-50 %] 40 % Set Rate:  [18 bmp] 18 bmp Vt Set:  [410 mL] 410 mL PEEP:  [5 cmH20] 5 cmH20 Plateau Pressure:  [11 cmH20-14 cmH20] 14 cmH20   Intake/Output Summary (Last 24 hours) at 10/31/2023 0745 Last data filed at 10/31/2023 0600 Gross per  24 hour  Intake 4513.07 ml  Output 2075 ml  Net 2438.07 ml   Filed Weights   10/29/23 1700 10/30/23 0703  Weight: 50 kg 51.2 kg    Examination: General: intubated and sedated, in no distress  HENT: ET tube present, NCAT Lungs: clear bilaterally, no accessory muscle use  Cardiovascular: RRR, no murmur Abdomen: soft, wound vac present with ~453ml purulent drainage in wound vac canister. Absent bowel sounds Extremities: left hand cool (a-line), no edema  Neuro: unable to assess, rass goal -3/-4  Resolved Hospital Problem list   Septic shock Hypokalemia  Assessment & Plan:   Perforated colon and s/p post right hemicolectomy 3/15 secondary to chronic intestinal pseudo obstruction  Colon left discontinuous with wound VAC.  Plan Back to OR today  Will need PICC line for TNA Wound care as directed by surgical team   Septic shock due to abd peritonitis from  perforated colon superimposed on Chronic hypotension also exacerbated by sedating meds Large amount purulent drainage in wound vac. On midodrine at home. Pos blood cx 3/15 likely contaminant. Repeat blood cx NGTD. Currently on neo but also maxed on propofol. Suspect pressor requirement is due to sedation and pt is no longer in shock. Plan Back to surgery today  Continue zosyn day 3/x Continue neo, taper as able Picc to be placed today, get CVP Monitor blood cx for growth Adding empirical fungal coverage  Ventilator management  Remains intubated post op with plans to take back to OR today Plan no SBT while abd in discontinuation  rass goal -3 Continue propofol and fentanyl VAP bundle  PRN CXR   Hypoglycemia CBGs  50s-80s. Not currently getting any nutrition  Plan Start D10 at 50cc /hr cbg q 2 h   Iron deficiency anemia/B12 deficiency with questionable absorption Plan Supplement as needed   Best Practice (right click and "Reselect all SmartList Selections" daily)   Diet/type: NPO DVT prophylaxis SCD Pressure  ulcer(s): N/A GI prophylaxis: N/A Lines: N/A Foley:  N/A Code Status:  full code Last date of multidisciplinary goals of care discussion [NA] family updated at bedside  My cct 32 min  Simonne Martinet ACNP-BC [facility] Pulmonary/Critical Care Pager # [phone] OR # [phone] if no answer

## 2023-10-31 NOTE — Progress Notes (Signed)
 PICC now placed Prop stopped earlier, as was fent infusion Evaluated earlier around 1720 at that point still sedated w/ dex at 0.14mcg/kg/min  She would open eyes, nod, spont efforts on PSV of 5 peep 5 w/ VTs in the 1200 cc range but then would trigger apnea if left unstimulated.  Had nursing stop dex infusion Returned to bedside around 1740. More awake. Starting to track more. Following commands.  Placed on SBT w/ initial VTs 700s. Rate 12-14 no accessory use. Not indicating shortness of breath when asked. Still a little weak. Able to squeeze hands but not really lift spont on own. Not able to lift head off bed. Not clear to me if some of this may just be all of her tubes and lines.   Plan Cont SBT off from sedating gtts Will re-eval after ~20-30 minutes. If she continues to improve we should be able to get her extubated today

## 2023-10-31 NOTE — Anesthesia Preprocedure Evaluation (Addendum)
 Anesthesia Evaluation  Patient identified by MRN, date of birth, ID band Patient unresponsive    Reviewed: Allergy & Precautions, NPO status , Patient's Chart, lab work & pertinent test results  Airway Mallampati: Intubated       Dental   Pulmonary  Intubated with 7.5 oral ETT      + intubated    Cardiovascular negative cardio ROS  Rhythm:Regular Rate:Normal     Neuro/Psych   Anxiety     Sedated  Propofol and Fentanyl drips    GI/Hepatic Neg liver ROS,,,  Endo/Other  negative endocrine ROS    Renal/GU negative Renal ROS     Musculoskeletal negative musculoskeletal ROS (+)    Abdominal   Peds  Hematology  (+) Blood dyscrasia, anemia   Anesthesia Other Findings Open Abdomen  Reproductive/Obstetrics                             Anesthesia Physical Anesthesia Plan  ASA: 3  Anesthesia Plan: General   Post-op Pain Management:    Induction:   PONV Risk Score and Plan: 3 and Ondansetron, Dexamethasone, Midazolam and Treatment may vary due to age or medical condition  Airway Management Planned: Oral ETT  Additional Equipment:   Intra-op Plan:   Post-operative Plan: Possible Post-op intubation/ventilation  Informed Consent: I have reviewed the patients History and Physical, chart, labs and discussed the procedure including the risks, benefits and alternatives for the proposed anesthesia with the patient or authorized representative who has indicated his/her understanding and acceptance.     Consent reviewed with POA  Plan Discussed with: CRNA and Surgeon  Anesthesia Plan Comments: (Anesthetic plan discussed with husband at the bedside in the ICU. )       Anesthesia Quick Evaluation

## 2023-11-01 ENCOUNTER — Encounter (HOSPITAL_COMMUNITY): Payer: Self-pay | Admitting: General Surgery

## 2023-11-01 DIAGNOSIS — E440 Moderate protein-calorie malnutrition: Secondary | ICD-10-CM | POA: Diagnosis not present

## 2023-11-01 DIAGNOSIS — A419 Sepsis, unspecified organism: Secondary | ICD-10-CM | POA: Diagnosis not present

## 2023-11-01 DIAGNOSIS — R100 Acute abdomen: Secondary | ICD-10-CM | POA: Diagnosis not present

## 2023-11-01 DIAGNOSIS — R6521 Severe sepsis with septic shock: Secondary | ICD-10-CM | POA: Diagnosis not present

## 2023-11-01 LAB — BASIC METABOLIC PANEL
Anion gap: 6 (ref 5–15)
BUN: 14 mg/dL (ref 6–20)
CO2: 24 mmol/L (ref 22–32)
Calcium: 7.7 mg/dL — ABNORMAL LOW (ref 8.9–10.3)
Chloride: 110 mmol/L (ref 98–111)
Creatinine, Ser: 0.54 mg/dL (ref 0.44–1.00)
GFR, Estimated: >60 mL/min (ref >60)
Glucose, Bld: 137 mg/dL — ABNORMAL HIGH (ref 70–99)
Potassium: 3.6 mmol/L (ref 3.5–5.1)
Sodium: 140 mmol/L (ref 135–145)

## 2023-11-01 LAB — GLUCOSE, CAPILLARY
Glucose-Capillary: 110 mg/dL — ABNORMAL HIGH (ref 70–99)
Glucose-Capillary: 118 mg/dL — ABNORMAL HIGH (ref 70–99)
Glucose-Capillary: 119 mg/dL — ABNORMAL HIGH (ref 70–99)
Glucose-Capillary: 125 mg/dL — ABNORMAL HIGH (ref 70–99)
Glucose-Capillary: 131 mg/dL — ABNORMAL HIGH (ref 70–99)
Glucose-Capillary: 131 mg/dL — ABNORMAL HIGH (ref 70–99)
Glucose-Capillary: 148 mg/dL — ABNORMAL HIGH (ref 70–99)

## 2023-11-01 LAB — COMPREHENSIVE METABOLIC PANEL
ALT: 20 U/L (ref 0–44)
AST: 18 U/L (ref 15–41)
Albumin: 1.8 g/dL — ABNORMAL LOW (ref 3.5–5.0)
Alkaline Phosphatase: 56 U/L (ref 38–126)
Anion gap: 6 (ref 5–15)
BUN: 12 mg/dL (ref 6–20)
CO2: 24 mmol/L (ref 22–32)
Calcium: 7.5 mg/dL — ABNORMAL LOW (ref 8.9–10.3)
Chloride: 110 mmol/L (ref 98–111)
Creatinine, Ser: 0.44 mg/dL (ref 0.44–1.00)
GFR, Estimated: >60 mL/min (ref >60)
Glucose, Bld: 130 mg/dL — ABNORMAL HIGH (ref 70–99)
Potassium: 3 mmol/L — ABNORMAL LOW (ref 3.5–5.1)
Sodium: 140 mmol/L (ref 135–145)
Total Bilirubin: 1.3 mg/dL — ABNORMAL HIGH (ref 0.0–1.2)
Total Protein: 4.4 g/dL — ABNORMAL LOW (ref 6.5–8.1)

## 2023-11-01 LAB — CBC
HCT: 34.9 % — ABNORMAL LOW (ref 36.0–46.0)
Hemoglobin: 11 g/dL — ABNORMAL LOW (ref 12.0–15.0)
MCH: 28.7 pg (ref 26.0–34.0)
MCHC: 31.5 g/dL (ref 30.0–36.0)
MCV: 91.1 fL (ref 80.0–100.0)
Platelets: 236 10*3/uL (ref 150–400)
RBC: 3.83 MIL/uL — ABNORMAL LOW (ref 3.87–5.11)
RDW: 14.4 % (ref 11.5–15.5)
WBC: 8.6 10*3/uL (ref 4.0–10.5)
nRBC: 0 % (ref 0.0–0.2)

## 2023-11-01 LAB — TRIGLYCERIDES: Triglycerides: 640 mg/dL — ABNORMAL HIGH (ref <150)

## 2023-11-01 LAB — MAGNESIUM: Magnesium: 1.5 mg/dL — ABNORMAL LOW (ref 1.7–2.4)

## 2023-11-01 LAB — SURGICAL PATHOLOGY

## 2023-11-01 MED ORDER — FENTANYL CITRATE PF 50 MCG/ML IJ SOSY: 25.0000 ug | PREFILLED_SYRINGE | INTRAMUSCULAR | Status: DC | PRN

## 2023-11-01 MED ORDER — MORPHINE SULFATE 1 MG/ML IV SOLN PCA
INTRAVENOUS | Status: DC
  Administered 2023-11-01: 12 mg via INTRAVENOUS
  Administered 2023-11-02: 6 mg via INTRAVENOUS
  Administered 2023-11-02: 10.5 mg via INTRAVENOUS
  Administered 2023-11-02: 13.5 mg via INTRAVENOUS
  Administered 2023-11-02 (×2): 3 mg via INTRAVENOUS
  Filled 2023-11-01 (×3): qty 30

## 2023-11-01 MED ORDER — THIAMINE HCL 100 MG/ML IJ SOLN
100.0000 mg | Freq: Every day | INTRAMUSCULAR | Status: AC
  Administered 2023-11-01 – 2023-11-05 (×5): 100 mg via INTRAVENOUS
  Filled 2023-11-01 (×5): qty 2

## 2023-11-01 MED ORDER — NALOXONE HCL 0.4 MG/ML IJ SOLN: 0.4000 mg | INTRAMUSCULAR | Status: DC | PRN

## 2023-11-01 MED ORDER — MORPHINE SULFATE (PF) 2 MG/ML IV SOLN
1.0000 mg | INTRAVENOUS | Status: DC | PRN
  Administered 2023-11-01 (×3): 2 mg via INTRAVENOUS
  Filled 2023-11-01 (×3): qty 1

## 2023-11-01 MED ORDER — DIPHENHYDRAMINE HCL 12.5 MG/5ML PO ELIX: 12.5000 mg | ORAL_SOLUTION | Freq: Four times a day (QID) | ORAL | Status: DC | PRN

## 2023-11-01 MED ORDER — MAGNESIUM SULFATE 2 GM/50ML IV SOLN
2.0000 g | Freq: Once | INTRAVENOUS | Status: AC
  Administered 2023-11-01: 2 g via INTRAVENOUS
  Filled 2023-11-01: qty 50

## 2023-11-01 MED ORDER — SODIUM CHLORIDE 0.9% FLUSH: 9.0000 mL | INTRAVENOUS | Status: DC | PRN

## 2023-11-01 MED ORDER — TRAVASOL 10 % IV SOLN
INTRAVENOUS | Status: AC
  Filled 2023-11-01: qty 528

## 2023-11-01 MED ORDER — POTASSIUM CHLORIDE 10 MEQ/50ML IV SOLN
10.0000 meq | INTRAVENOUS | Status: AC
  Administered 2023-11-02 (×2): 10 meq via INTRAVENOUS
  Filled 2023-11-01 (×2): qty 50

## 2023-11-01 MED ORDER — ONDANSETRON HCL 4 MG/2ML IJ SOLN: 4.0000 mg | Freq: Four times a day (QID) | INTRAMUSCULAR | Status: DC | PRN

## 2023-11-01 MED ORDER — METHOCARBAMOL 1000 MG/10ML IJ SOLN
500.0000 mg | Freq: Three times a day (TID) | INTRAMUSCULAR | Status: DC
  Administered 2023-11-01 – 2023-11-03 (×7): 500 mg via INTRAVENOUS
  Filled 2023-11-01 (×7): qty 5

## 2023-11-01 MED ORDER — POTASSIUM CHLORIDE 10 MEQ/50ML IV SOLN
10.0000 meq | INTRAVENOUS | Status: AC
  Administered 2023-11-01 (×6): 10 meq via INTRAVENOUS
  Filled 2023-11-01 (×6): qty 50

## 2023-11-01 MED ORDER — ENOXAPARIN SODIUM 40 MG/0.4ML IJ SOSY
40.0000 mg | PREFILLED_SYRINGE | INTRAMUSCULAR | Status: DC
  Administered 2023-11-01 – 2023-11-15 (×15): 40 mg via SUBCUTANEOUS
  Filled 2023-11-01 (×15): qty 0.4

## 2023-11-01 MED ORDER — INSULIN ASPART 100 UNIT/ML IJ SOLN: 0.0000 [IU] | Freq: Three times a day (TID) | INTRAMUSCULAR | Status: DC

## 2023-11-01 MED ORDER — DIPHENHYDRAMINE HCL 50 MG/ML IJ SOLN
12.5000 mg | Freq: Four times a day (QID) | INTRAMUSCULAR | Status: DC | PRN
  Administered 2023-11-04: 12.5 mg via INTRAVENOUS
  Filled 2023-11-01: qty 1

## 2023-11-01 MED ORDER — POTASSIUM CHLORIDE 10 MEQ/50ML IV SOLN
10.0000 meq | INTRAVENOUS | Status: AC
  Administered 2023-11-01 (×4): 10 meq via INTRAVENOUS
  Filled 2023-11-01 (×6): qty 50

## 2023-11-01 NOTE — Progress Notes (Signed)
 NAME:  Stacy [name], MRN:  [id number], DOB:  06/24/1981, LOS: 3 ADMISSION DATE:  10/29/2023, CONSULTATION DATE:  11/01/2023  REFERRING MD:  Nicanor Alcon, EDP, CHIEF COMPLAINT: Abdominal pain  History of Present Illness:  43 year old woman with a history of chronic intestinal pseudoobstruction presented with severe abdominal pain that started around midnight.  She was hypotensive initially responding to IV fluids. Labs showed normal lactate, potassium 2.0, mild leukopenia 3.8, chronic anemia 10.8 CT abdomen showed pneumoperitoneum with free fluid in the abdomen, bowel thickening/edema small and large bowel which was chronic without pneumatosis, left kidney lesion 0.9 cm, stable liver lesions Seen by surgery and plan is to take to the OR  Pertinent  Medical History  B12 and iron deficiency Protein calorie malnutrition Chronic hypotension -on midodrine and fludrocortisone  Significant Hospital Events: Including procedures, antibiotic start and stop dates in addition to other pertinent events   3/15 exploratory laparotomy >> perforation at mid transverse colon, mesenteric mass and proximal transverse colon, diffuse severe purulent peritonitis >> extended right hemicolectomy, wound VAC. Left discontinuous. Remains intubated postop 3/17 antifungal coverage added. Hypoglycemic. Added D10. went back to OR. Re-exploration, washout, ileocolonic anastomosis, cholecystectomy. Back to ICU. PICC placed RUE. Extubated after successful SBT.  3/18 getting fc out. Changed BP goal to systolic 90s. Having pain. Regimen adjusted per surgical team. Getting OOB off oxygen   Interim History / Subjective:  Having pain. Feeling weak   Objective   Blood pressure 132/89, pulse (!) 103, temperature 98.6 F (37 C), temperature source Oral, resp. rate 19, height 5\' 3"  (1.6 m), weight 57.2 kg, SpO2 100%. CVP:  [1 mmHg-5 mmHg] 4 mmHg  Vent Mode: PSV;CPAP FiO2 (%):  [30 %-40 %] 30 % Set Rate:  [18 bmp] 18 bmp Vt Set:   [410 mL] 410 mL PEEP:  [5 cmH20] 5 cmH20 Pressure Support:  [5 cmH20] 5 cmH20 Plateau Pressure:  [13 cmH20-14 cmH20] 13 cmH20   Intake/Output Summary (Last 24 hours) at 11/01/2023 0721 Last data filed at 11/01/2023 0400 Gross per 24 hour  Intake 2743.98 ml  Output 2490 ml  Net 253.98 ml   Filed Weights   10/29/23 1700 10/30/23 0703 10/31/23 0701  Weight: 50 kg 51.2 kg 57.2 kg    Examination: General 43 year old female laying in bed no distress HENT NCAT no JVD NGT in place to LIWS Pulm dec bases no accessory use. Just taken off Oxygen and sats staying in 90s Card rrr Abd soft dressing intact hypoactive Bowel sounds Ext warm and dry  Neuro oriented x 3 moves all ext but very weak   Resolved Hospital Problem list   Septic shock Hypokalemia  Assessment & Plan:   Perforated colon w/ proximal mesenteric mass  s/p post right hemicolectomy 3/15 w/ return to OR 3/17 for Re-exploration, washout, ileocolonic anastomosis, cholecystectomy. Complicated by h/o chronic intestinal pseudo-obstruction  Plan NPO; deferring advancement of oral intake to surgical team Wound care as directed by surgical team PICC placed 3/17; anticipate she will need IV nutrition Mobilize Spiro Multi-modal analgesic support  Remove foley  Septic shock due to abd peritonitis from  perforated colon superimposed on Chronic hypotension also exacerbated by sedating meds -cultures w/ exception of isolated BC (felt contaminant) negative to date -she is still on low dose Neo. BP fluctuating. Don't really see a correlation between fent administration and BP last night. Only 1 dose fent. Hgb stable, WBC better, no acidosis, CVP low nml. I think her current BP is likely close to  baseline Plan Continue zosyn day 4/x, day 2 micafungin  Change neo parameters to SBP >90. Remove MAP parameter CVP monitoring to continue q shift Remove a line later today  Monitor blood cx for growth  Bibasilar atelectasis  -extubated  4/17. O2 removed. Sats mid 90s Plan Push pulm hygiene  Mobilize Wean O2 PRN CXR  Hypokalemia Plan Replace and recheck as indicated w/ concern for post-op ileus risk shoot for K goal > 4  Hypertriglyceridemia  2/2 propofol. Dc'd post-op 3/18. Peaked at 793 Plan Repeat in am 3/19  Will need to be taken into consideration re: TNA   Hypoglycemia CBGs 50s-80s. Started D10 3/18 w/ excellent response.  Plan Cont D10 at 50cc /hr, change CBG to q 4 Can likely wean off if we start TNA later today   Iron deficiency anemia/B12 deficiency with questionable absorption Plan Supplement as needed   Best Practice (right click and "Reselect all SmartList Selections" daily)   Diet/type: NPO DVT prophylaxis LMWH Pressure ulcer(s): N/A GI prophylaxis: N/A Lines: N/A Foley:  N/A Code Status:  full code Last date of multidisciplinary goals of care discussion [NA] family updated at bedside  My cct 32 min  Simonne Martinet ACNP-BC [facility] Pulmonary/Critical Care Pager # [phone] OR # [phone] if no answer

## 2023-11-01 NOTE — Progress Notes (Signed)
 Pain management has been difficult over course of day. Has limited our ability to mobilize her.  BP was labile over course of day. Seemed to respond to LR. She's now ~8 liters pos so will hold off on further bolus unless hemodynamics suggest as we will also be adding IVF volume w/ TPN tonight  Adding PCA K and Mg not at goal  Plan ->replace

## 2023-11-01 NOTE — Progress Notes (Signed)
 1 Day Post-Op  Subjective: Awake, extubated.  Husband at bedside.  Some pain, but seems well controlled currently.  Tmax 100.8  Objective: Vital signs in last 24 hours: Temp:  [98.6 F (37 C)-100 F (37.8 C)] 98.6 F (37 C) (03/17 2000) Pulse Rate:  [85-105] 103 (03/18 0200) Resp:  [14-19] 19 (03/18 0200) SpO2:  [100 %] 100 % (03/18 0200) Arterial Line BP: (97-130)/(59-95) 130/85 (03/18 0200) FiO2 (%):  [30 %] 30 % (03/17 1730) Weight:  [53.8 kg] 53.8 kg (03/18 0707)    Intake/Output from previous day: 03/17 0701 - 03/18 0700 In: 2744 [I.V.:2358.4; IV Piggyback:385.6] Out: 2490 [Urine:1750; Emesis/NG output:250; Drains:470; Blood:20] Intake/Output this shift: No intake/output data recorded.  PE: Gen: NAD Heart: regular Lungs: respiratory effort nonlabored Abd: soft, appropriately tender, midline wound is clean and packed.  Fascia intact, NGT with some bilious output, 250cc yesterday GU: foley in place with clear yellow output.  Lab Results:  Recent Labs    10/31/23 0515 11/01/23 0550  WBC 13.2* 8.6  HGB 11.0* 11.0*  HCT 35.7* 34.9*  PLT 324 236   BMET Recent Labs    10/31/23 0515 11/01/23 0550  NA 144 140  K 4.1 3.0*  CL 114* 110  CO2 23 24  GLUCOSE 75 130*  BUN 19 12  CREATININE 0.61 0.44  CALCIUM 8.3* 7.5*   PT/INR No results for input(s): "LABPROT", "INR" in the last 72 hours. CMP     Component Value Date/Time   NA 140 11/01/2023 0550   NA 137 01/15/2019 0000   K 3.0 (L) 11/01/2023 0550   CL 110 11/01/2023 0550   CO2 24 11/01/2023 0550   GLUCOSE 130 (H) 11/01/2023 0550   BUN 12 11/01/2023 0550   BUN 15 01/15/2019 0000   CREATININE 0.44 11/01/2023 0550   CALCIUM 7.5 (L) 11/01/2023 0550   PROT 4.4 (L) 11/01/2023 0550   ALBUMIN 1.8 (L) 11/01/2023 0550   AST 18 11/01/2023 0550   ALT 20 11/01/2023 0550   ALKPHOS 56 11/01/2023 0550   BILITOT 1.3 (H) 11/01/2023 0550   GFRNONAA >60 11/01/2023 0550   GFRAA >60 05/20/2020 0502   Lipase      Component Value Date/Time   LIPASE 21 04/29/2020 0315       Studies/Results: [place] EKG SITE RITE Result Date: 10/30/2023 If Site Rite image not attached, placement could not be confirmed due to current cardiac rhythm.   Anti-infectives: Anti-infectives (From admission, onward)    Start     Dose/Rate Route Frequency Ordered Stop   10/31/23 1100  micafungin (MYCAMINE) 100 mg in sodium chloride 0.9 % 100 mL IVPB        100 mg 105 mL/hr over 1 Hours Intravenous Daily 10/31/23 1031     10/29/23 1800  piperacillin-tazobactam (ZOSYN) IVPB 3.375 g        3.375 g 12.5 mL/hr over 240 Minutes Intravenous Every 8 hours 10/29/23 0918     10/29/23 0930  piperacillin-tazobactam (ZOSYN) IVPB 3.375 g        3.375 g 100 mL/hr over 30 Minutes Intravenous STAT 10/29/23 0918 10/29/23 1005   10/29/23 0615  ceFEPIme (MAXIPIME) 2 g in sodium chloride 0.9 % 100 mL IVPB        2 g 200 mL/hr over 30 Minutes Intravenous  Once 10/29/23 0610 10/29/23 0703   10/29/23 0615  metroNIDAZOLE (FLAGYL) IVPB 500 mg        500 mg 100 mL/hr over 60 Minutes Intravenous  Once 10/29/23 0610 10/29/23 0825        Assessment/Plan POD 3/1, ex lap with extended right colectomy for perforated transverse colon, Dr. Fredricka Bonine, 3/15, ex lap with ileocolonic anastomosis and cholecystectomy Dr. Andrey Campanile 3/17  -significant contamination.  Cont abx therapy for likely 5-7 days post op and monitor for post op infection. -WBC normal today, monitor labs -cont NGT, expect post op ileus, may need to go ahead and consider TNA -multi-modal pain control, IV tylenol, morphine, robaxin -replace K with 6 runs to keep K up to help with ileus -PT to help start mobilizing -BID WD dressing changes to midline wound -pulm toilet/IS -path pending -d/w all of this with patient and her husband  FEN - NPO/NGT/IVFs, replace K as above VTE - lovenox ID - zosyn, micafungin    LOS: 3 days    Letha Cape , [facility]  Surgery 11/01/2023, 8:15 AM Please see Amion for pager number during day hours 7:00am-4:30pm or 7:00am -11:30am on weekends

## 2023-11-01 NOTE — Progress Notes (Addendum)
 PHARMACY - TOTAL PARENTERAL NUTRITION CONSULT NOTE   Indication: Prolonged ileus  Patient Measurements: Height: 5\' 3"  (160 cm) Weight: 53.8 kg (118 lb 9.7 oz) IBW/kg (Calculated) : 52.4 TPN AdjBW (KG): 50 Body mass index is 21.01 kg/m.  Assessment: 61 yoF admitted on 3/15 with abdominal pain, found to have pneumoperitoneum, colon perforation, mesenteric mass, diffuse/severe peritonitis, and distended gallbladder.  She is s/p OR on 3/15 and 3/17.  Pharmacy is consulted to dose TPN.    Glucose / Insulin: Initially hypoglycemic, but improved after starting D10W Electrolytes: WNL except K 3.0 (KCl IV ordered), CorrCa 9.26 Renal: SCr < 1, BUN WNL Hepatic: AST/ALT WNL, Albumin 1.8, Tbili 1.3 - Elevated Triglycerides on 3/16, 3/17, and 3/18.  Suspect related to previous high rate propofol use, but Propofol d/c 3/17 afternoon.  Intake / Output; MIVF:  - mIVF:  D10 @ 50 ml/hr - Output: drains 470 mL, NG 250 mL, urine 1750 mL GI Imaging: GI Surgeries / Procedures:  - 3/15 right colectomy for perforated transverse colon with proximal mesenteric mass   - 3/17 Ileocolonic anastomosis, cholecystectomy, and abdominal closure   Central access: PICC 3/17 TPN start date: 3/18   Nutritional Goals: Goal TPN rate is 70 mL/hr (provides 92 g of protein and 1730 kcals per day)  RD Assessment: Estimated Needs Total Energy Estimated Needs: 1600-1750 kcals Total Protein Estimated Needs: 85-100 grams Total Fluid Estimated Needs: >/= 1.6L  Current Nutrition:  NPO  Plan:  Start TPN at 40 mL/hr at 1800 - Remove lipids from TPN due to elevated triglycerides until TG < 400. Electrolytes in TPN: Na 16mEq/L, K 26mEq/L, Ca 67mEq/L, Mg 95mEq/L, and Phos 65mmol/L. Cl:Ac 1:1 Add standard MVI and trace elements to TPN Initiate CBGs and sensitive SSI q8h and adjust as needed  Reduce MIVF to KVO at 1800 Monitor TPN labs on Mon/Thurs and prn.  Daily Bmet/mag/phos x3 days d/t refeeding risk.  Recheck  triglycerides on Thursday 3/20.   Thiamine 100mg  IV x5 days.   Lynann Beaver PharmD, BCPS [facility] main pharmacy [phone] 11/01/2023 11:34 AM

## 2023-11-01 NOTE — Plan of Care (Signed)

## 2023-11-02 DIAGNOSIS — A419 Sepsis, unspecified organism: Secondary | ICD-10-CM | POA: Diagnosis not present

## 2023-11-02 DIAGNOSIS — R100 Acute abdomen: Secondary | ICD-10-CM | POA: Diagnosis not present

## 2023-11-02 DIAGNOSIS — R6521 Severe sepsis with septic shock: Secondary | ICD-10-CM | POA: Diagnosis not present

## 2023-11-02 LAB — COMPREHENSIVE METABOLIC PANEL
ALT: 17 U/L (ref 0–44)
AST: 16 U/L (ref 15–41)
Albumin: 1.7 g/dL — ABNORMAL LOW (ref 3.5–5.0)
Alkaline Phosphatase: 56 U/L (ref 38–126)
Anion gap: 5 (ref 5–15)
BUN: 17 mg/dL (ref 6–20)
CO2: 24 mmol/L (ref 22–32)
Calcium: 7.8 mg/dL — ABNORMAL LOW (ref 8.9–10.3)
Chloride: 111 mmol/L (ref 98–111)
Creatinine, Ser: 0.51 mg/dL (ref 0.44–1.00)
GFR, Estimated: >60 mL/min (ref >60)
Glucose, Bld: 122 mg/dL — ABNORMAL HIGH (ref 70–99)
Potassium: 4.3 mmol/L (ref 3.5–5.1)
Sodium: 140 mmol/L (ref 135–145)
Total Bilirubin: 1 mg/dL (ref 0.0–1.2)
Total Protein: 4.6 g/dL — ABNORMAL LOW (ref 6.5–8.1)

## 2023-11-02 LAB — GLUCOSE, CAPILLARY
Glucose-Capillary: 107 mg/dL — ABNORMAL HIGH (ref 70–99)
Glucose-Capillary: 107 mg/dL — ABNORMAL HIGH (ref 70–99)
Glucose-Capillary: 111 mg/dL — ABNORMAL HIGH (ref 70–99)
Glucose-Capillary: 112 mg/dL — ABNORMAL HIGH (ref 70–99)
Glucose-Capillary: 117 mg/dL — ABNORMAL HIGH (ref 70–99)

## 2023-11-02 LAB — BPAM RBC
Blood Product Expiration Date: 202504092359
Blood Product Expiration Date: 202504122359
ISSUE DATE / TIME: 202503151520
Unit Type and Rh: 5100
Unit Type and Rh: 5100

## 2023-11-02 LAB — CBC
HCT: 33.4 % — ABNORMAL LOW (ref 36.0–46.0)
Hemoglobin: 10.1 g/dL — ABNORMAL LOW (ref 12.0–15.0)
MCH: 28.7 pg (ref 26.0–34.0)
MCHC: 30.2 g/dL (ref 30.0–36.0)
MCV: 94.9 fL (ref 80.0–100.0)
Platelets: 263 10*3/uL (ref 150–400)
RBC: 3.52 MIL/uL — ABNORMAL LOW (ref 3.87–5.11)
RDW: 14.6 % (ref 11.5–15.5)
WBC: 10.5 10*3/uL (ref 4.0–10.5)
nRBC: 0 % (ref 0.0–0.2)

## 2023-11-02 LAB — TYPE AND SCREEN
ABO/RH(D): O POS
Antibody Screen: NEGATIVE
Unit division: 0
Unit division: 0

## 2023-11-02 LAB — PHOSPHORUS: Phosphorus: 2.2 mg/dL — ABNORMAL LOW (ref 2.5–4.6)

## 2023-11-02 LAB — SURGICAL PATHOLOGY

## 2023-11-02 LAB — MAGNESIUM: Magnesium: 2 mg/dL (ref 1.7–2.4)

## 2023-11-02 MED ORDER — PHENOL 1.4 % MT LIQD
1.0000 | OROMUCOSAL | Status: DC | PRN
  Administered 2023-11-02: 1 via OROMUCOSAL
  Filled 2023-11-02: qty 177

## 2023-11-02 MED ORDER — SODIUM PHOSPHATES 45 MMOLE/15ML IV SOLN
15.0000 mmol | Freq: Once | INTRAVENOUS | Status: AC
  Administered 2023-11-02: 15 mmol via INTRAVENOUS
  Filled 2023-11-02: qty 5

## 2023-11-02 MED ORDER — STERILE WATER FOR INJECTION IV SOLN
INTRAMUSCULAR | Status: DC
  Filled 2023-11-02: qty 726

## 2023-11-02 MED ORDER — ACETAMINOPHEN 10 MG/ML IV SOLN
1000.0000 mg | Freq: Four times a day (QID) | INTRAVENOUS | Status: AC
  Administered 2023-11-02 – 2023-11-03 (×4): 1000 mg via INTRAVENOUS
  Filled 2023-11-02 (×3): qty 100

## 2023-11-02 MED ORDER — TRAVASOL 10 % IV SOLN
INTRAVENOUS | Status: AC
  Filled 2023-11-02: qty 726

## 2023-11-02 NOTE — Progress Notes (Signed)
 eLink Physician-Brief Progress Note Patient Name: Kynli Chou DOB: June 19, 1981 MRN: [id number]   Date of Service  11/02/2023  HPI/Events of Note  Post intubation sore throat  eICU Interventions  Chloraseptic spray     Intervention Category Minor Interventions: Routine modifications to care plan (e.g. PRN medications for pain, fever)  Kemp Gomes 11/02/2023, 5:19 AM

## 2023-11-02 NOTE — Progress Notes (Addendum)
 PHARMACY - TOTAL PARENTERAL NUTRITION CONSULT NOTE   Indication: Prolonged ileus  Patient Measurements: Height: 5\' 3"  (160 cm) Weight: 54 kg (119 lb 0.8 oz) IBW/kg (Calculated) : 52.4 TPN AdjBW (KG): 50 Body mass index is 21.09 kg/m.  Assessment: 66 yoF admitted on 3/15 with abdominal pain, found to have pneumoperitoneum, colon perforation, mesenteric mass, diffuse/severe peritonitis, and distended gallbladder.  She is s/p OR on 3/15 and 3/17.  Pharmacy is consulted to dose TPN.    Glucose / Insulin: No hx DM -Hypoglycemic requiring D10 infusion on 3/16 prior to TPN -BG goal <180. Range 117 - 148 (no SSI required) Electrolytes: Phos (2.2) low. All other electrolytes WNL, including CorrCa (9.6) -Goal: K >/= 4, Mg >/= 2 Renal: SCr < 1, BUN WNL Hepatic: AST/ALT, Tbili - WNL, Albumin 1.7 low - Elevated Triglycerides on 3/16, 3/17, and 3/18.  Suspect related to previous high rate propofol use, but Propofol d/c 3/17 afternoon.  Intake / Output; MIVF: No mIVF.  -UOP: 950 mL, NG output: 1100 mL. Drain removed. GI Imaging: GI Surgeries / Procedures:  - 3/15 right colectomy for perforated transverse colon with proximal mesenteric mass   - 3/17 Ileocolonic anastomosis, cholecystectomy, and abdominal closure   Central access: PICC 3/17 TPN start date: 3/18   Nutritional Goals: Goal TPN rate is 70 mL/hr (provides 92 g of protein and 1730 kcals per day)  RD Assessment: Estimated Needs Total Energy Estimated Needs: 1600-1750 kcals Total Protein Estimated Needs: 85-100 grams Total Fluid Estimated Needs: >/= 1.6L  Current Nutrition:  NPO, TPN  Plan:  Now:  Na Phos 15 mmol IV once  At 1800: Increase TPN to 55 mL/hr at 1800 Will provide 964 kcal, 73 protein Lipids removed from TPN due to elevated triglycerides until TG < 400. Electrolytes in TPN: No changes Na 50 mEq/L, K 50 mEq/L, Ca 5 mEq/L, Mg 5 mEq/L, and Phos 15 mmol/L.  Cl:Ac 1:1 Add standard MVI and trace elements to  TPN Continue CBG + sensitive SSI q8h and adjust as needed  mIVF management per MD Monitor TPN labs on Mon/Thurs and prn.  Daily Bmet/mag/phos x3 days d/t refeeding risk.  Recheck triglycerides on Thursday 3/20. Thiamine 100mg  IV x5 days.   Cindi Carbon, PharmD 11/02/23 8:15 AM

## 2023-11-02 NOTE — TOC Progression Note (Signed)
 Transition of Care [facility]) - Progression Note    Patient Details  Name: Stacy [name] MRN: [id number] Date of Birth: Nov 16, 1980  Transition of [facility]) CM/SW Contact  Howell Rucks, RN Phone Number: 11/02/2023, 2:12 PM  Clinical Narrative:   Meet with pt and spouse at bedside to introduce role of TOC/NCM and review for dc planning, review PT recommendation for [facility] PT, discussed TOC will continue to follow for dc needs as pt becomes more medically stable, spouse voiced agreement.       Barriers to Discharge: Continued Medical Work up  Expected Discharge Plan and Services       Living arrangements for the past 2 months: Single Family Home                   DME Agency: NA                   Social Determinants of Health (SDOH) Interventions SDOH Screenings   Food Insecurity: No Food Insecurity (10/30/2023)  Housing: Low Risk  (10/30/2023)  Transportation Needs: Patient Unable To Answer (10/30/2023)  Utilities: Patient Unable To Answer (10/30/2023)  Alcohol Screen: Low Risk  (06/09/2023)  Depression (PHQ2-9): Low Risk  (05/17/2023)  Financial Resource Strain: Low Risk  (06/09/2023)  Physical Activity: Insufficiently Active (06/09/2023)  Social Connections: Moderately Integrated (06/09/2023)  Stress: No Stress Concern Present (06/09/2023)  Tobacco Use: Low Risk  (10/30/2023)    Readmission Risk Interventions    11/02/2023    2:10 PM 10/30/2023    8:34 AM  Readmission Risk Prevention Plan  Transportation Screening Complete Complete  PCP or Specialist Appt within 5-7 Days Complete Complete  Home Care Screening Complete Complete  Medication Review (RN CM) Complete Complete

## 2023-11-02 NOTE — Progress Notes (Signed)
 2 Days Post-Op  Subjective: CC: Her husband is at bedside.   She cannot say where her abdominal pain is but it is improved from yesterday and well controlled w/ pca. No nausea. NGT output bilious, 1.1L/24 hours. No flatus or bm. Foley out yesterday and has not voided. RN to do bladder scan. Oob to chair yesterday.   Afebrile. Tachycardic in the low 100's. Soft BP overnight resolved - 107/72 on last check. Off pressors. Labs pending.    Objective: Vital signs in last 24 hours: Temp:  [98 F (36.7 C)-98.9 F (37.2 C)] 98.9 F (37.2 C) (03/19 0400) Pulse Rate:  [95-114] 103 (03/19 0600) Resp:  [11-19] 13 (03/19 0600) BP: (86-118)/(57-79) 107/72 (03/19 0600) SpO2:  [94 %-97 %] 96 % (03/19 0600) Arterial Line BP: (103-114)/(68-75) 103/68 (03/18 1000) Weight:  [54 kg] 54 kg (03/19 0500)    Intake/Output from previous day: 03/18 0701 - 03/19 0700 In: 2474 [I.V.:1251.1; IV Piggyback:1222.8] Out: 2050 [Urine:950; Emesis/NG output:1100] Intake/Output this shift: No intake/output data recorded.  PE: Gen:  Alert, NAD, pleasant Abd: Soft, mild distension, generalized ttp greatest around her incision, RLQ and left hemiabdomen without rigidity or guarding. Hypoactive BS. NGT bilious. Midline wound clean as below.    Lab Results:  Recent Labs    10/31/23 0515 11/01/23 0550  WBC 13.2* 8.6  HGB 11.0* 11.0*  HCT 35.7* 34.9*  PLT 324 236   BMET Recent Labs    11/01/23 0550 11/01/23 1418  NA 140 140  K 3.0* 3.6  CL 110 110  CO2 24 24  GLUCOSE 130* 137*  BUN 12 14  CREATININE 0.44 0.54  CALCIUM 7.5* 7.7*   PT/INR No results for input(s): "LABPROT", "INR" in the last 72 hours. CMP     Component Value Date/Time   NA 140 11/01/2023 1418   NA 137 01/15/2019 0000   K 3.6 11/01/2023 1418   CL 110 11/01/2023 1418   CO2 24 11/01/2023 1418   GLUCOSE 137 (H) 11/01/2023 1418   BUN 14 11/01/2023 1418   BUN 15 01/15/2019 0000   CREATININE 0.54 11/01/2023 1418   CALCIUM 7.7  (L) 11/01/2023 1418   PROT 4.4 (L) 11/01/2023 0550   ALBUMIN 1.8 (L) 11/01/2023 0550   AST 18 11/01/2023 0550   ALT 20 11/01/2023 0550   ALKPHOS 56 11/01/2023 0550   BILITOT 1.3 (H) 11/01/2023 0550   GFRNONAA >60 11/01/2023 1418   GFRAA >60 05/20/2020 0502   Lipase     Component Value Date/Time   LIPASE 21 04/29/2020 0315    Studies/Results: No results found.  Anti-infectives: Anti-infectives (From admission, onward)    Start     Dose/Rate Route Frequency Ordered Stop   10/31/23 1100  micafungin (MYCAMINE) 100 mg in sodium chloride 0.9 % 100 mL IVPB        100 mg 105 mL/hr over 1 Hours Intravenous Daily 10/31/23 1031     10/29/23 1800  piperacillin-tazobactam (ZOSYN) IVPB 3.375 g        3.375 g 12.5 mL/hr over 240 Minutes Intravenous Every 8 hours 10/29/23 0918     10/29/23 0930  piperacillin-tazobactam (ZOSYN) IVPB 3.375 g        3.375 g 100 mL/hr over 30 Minutes Intravenous STAT 10/29/23 0918 10/29/23 1005   10/29/23 0615  ceFEPIme (MAXIPIME) 2 g in sodium chloride 0.9 % 100 mL IVPB        2 g 200 mL/hr over 30 Minutes Intravenous  Once 10/29/23 0610  10/29/23 0703   10/29/23 0615  metroNIDAZOLE (FLAGYL) IVPB 500 mg        500 mg 100 mL/hr over 60 Minutes Intravenous  Once 10/29/23 0610 10/29/23 0825        Assessment/Plan POD 4/2, ex lap with extended right colectomy for perforated transverse colon, Dr. Fredricka Bonine, 3/15, ex lap with ileocolonic anastomosis and cholecystectomy Dr. Andrey Campanile 3/17  - Significant contamination.  Cont abx therapy for likely 5-7 days post op and monitor for post op infection. - WBC pending today, monitor labs - Cont NGT, expect post op ileus. Cont TPN - BID WD dressing changes to midline wound - Cont PCA. IV tylenol and robaxin - Keep K >=4, Phos >= 3, Mg >= 2 and mobilize for bowel function - PT to help start mobilizing - Pulm toilet/IS - Path pending - Updated her husband at bedside. Discussed w/ RN and CCM in person as well.    FEN -  NPO, NGT to LIWS, TPN, IVF per primary  VTE - SCDs, Lovenox ID - Zosyn/Micafungin.   Foley - out 3/18. Bladder scan now  Staph Capitis +Bcx   LOS: 4 days    Jacinto Halim, [facility] 11/02/2023, 7:13 AM Please see Amion for pager number during day hours 7:00am-4:30pm

## 2023-11-02 NOTE — Progress Notes (Signed)
 NAME:  Stacy [name], MRN:  [id number], DOB:  July 25, 1981, LOS: 4 ADMISSION DATE:  10/29/2023, CONSULTATION DATE:  11/02/2023  REFERRING MD:  Nicanor Alcon, EDP, CHIEF COMPLAINT: Abdominal pain  History of Present Illness:  43 year old woman with a history of chronic intestinal pseudoobstruction presented with severe abdominal pain that started around midnight.  She was hypotensive initially responding to IV fluids. Labs showed normal lactate, potassium 2.0, mild leukopenia 3.8, chronic anemia 10.8 CT abdomen showed pneumoperitoneum with free fluid in the abdomen, bowel thickening/edema small and large bowel which was chronic without pneumatosis, left kidney lesion 0.9 cm, stable liver lesions Seen by surgery and plan is to take to the OR  Pertinent  Medical History  B12 and iron deficiency Protein calorie malnutrition Chronic hypotension -on midodrine and fludrocortisone  Significant Hospital Events: Including procedures, antibiotic start and stop dates in addition to other pertinent events   3/15 exploratory laparotomy >> perforation at mid transverse colon, mesenteric mass and proximal transverse colon, diffuse severe purulent peritonitis >> extended right hemicolectomy, wound VAC. Left discontinuous. Remains intubated postop 3/17 antifungal coverage added. Hypoglycemic. Added D10. went back to OR. Re-exploration, washout, ileocolonic anastomosis, cholecystectomy. Back to ICU. PICC placed RUE. Extubated after successful SBT.  3/18 getting fc out. Changed BP goal to systolic 90s. Having pain. Regimen adjusted per surgical team. Getting OOB off oxygen   Interim History / Subjective:   Started on PCA pump Continues on TPN  Objective   Blood pressure 119/81, pulse (!) 108, temperature 98.9 F (37.2 C), temperature source Oral, resp. rate 16, height 5\' 3"  (1.6 m), weight 54 kg, SpO2 96%. CVP:  [4 mmHg-5 mmHg] 4 mmHg      Intake/Output Summary (Last 24 hours) at 11/02/2023 0809 Last data  filed at 11/02/2023 0801 Gross per 24 hour  Intake 2514.62 ml  Output 1250 ml  Net 1264.62 ml   Filed Weights   10/31/23 0701 11/01/23 0707 11/02/23 0500  Weight: 57.2 kg 53.8 kg 54 kg    Examination: Gen:      No acute distress HEENT:  EOMI, sclera anicteric Neck:     No masses; no thyromegaly Lungs:    Clear to auscultation bilaterally; normal respiratory effort CV:         Regular rate and rhythm; no murmurs Abd:      Mild abdominal tenderness, abdominal dressing Ext:    No edema; adequate peripheral perfusion Skin:      Warm and dry; no rash Neuro: alert and oriented x 3 Psych: normal mood and affect   Lab/imaging reviewed Significant for Phos 2.2 Hemoglobin 11.0  Resolved Hospital Problem list   Septic shock Hypokalemia  Assessment & Plan:   Perforated colon w/ proximal mesenteric mass  s/p post right hemicolectomy 3/15 w/ return to OR 3/17 for Re-exploration, washout, ileocolonic anastomosis, cholecystectomy. Complicated by h/o chronic intestinal pseudo-obstruction  Plan NPO; deferring advancement of oral intake to surgical team Wound care as directed by surgical team PICC placed 3/17; started on TPN Mobilize Spiro Multi-modal analgesic support  Remove foley  Septic shock due to abd peritonitis from  perforated colon superimposed on Chronic hypotension also exacerbated by sedating meds -cultures w/ exception of isolated BC (felt contaminant) negative to date -she is still on low dose Neo. BP fluctuating. Don't really see a correlation between fent administration and BP last night. Only 1 dose fent. Hgb stable, WBC better, no acidosis, CVP low nml. I think her current BP is likely close to baseline Plan  Continue zosyn day 5/x, day 3 micafungin  Monitor blood cx for growth  Bibasilar atelectasis  -extubated 4/17. O2 removed. Sats mid 90s Plan Push pulm hygiene  Mobilize Wean O2 PRN CXR  Hypertriglyceridemia  2/2 propofol. Dc'd post-op 3/18. Peaked at  793 Plan Follow labs Will need to be taken into consideration re: TNA   Iron deficiency anemia/B12 deficiency with questionable absorption Plan Supplement as needed  Transfer to stepdown unit and to hospitalist service.  Best Practice (right click and "Reselect all SmartList Selections" daily)   Diet/type: NPO, TPN DVT prophylaxis LMWH Pressure ulcer(s): N/A GI prophylaxis: N/A Lines: Central line Foley:  N/A Code Status:  full code Last date of multidisciplinary goals of care discussion [NA] family updated at bedside  Chilton Greathouse MD [facility] See Amion for pager  If no response to pager , please call [phone] until 7pm After 7:00 pm call Elink  [phone] 11/02/2023, 9:08 AM

## 2023-11-02 NOTE — Evaluation (Signed)
 Physical Therapy Evaluation Patient Details Name: Stacy Stacy [name] MRN: [id number] DOB: 10/21/1980 Today's Date: 11/02/2023  History of Present Illness  43 yo female presents to therapy following hospital admission on 10/29/2023 due to severe and sudden onset of abdominal pain with nausea, vomiting and diarrhea. Pt underwent an exploratory lap with extended R hemicolectomy due to perforated transferse colon and proximal mesenteric mass and placement of wound vac on 3/15 secondary to free air and pseudoobstruction. Pt hospitalization complicated secondary to hypotensive episode and decline in medical status requiring intubation with placement of NG tube. Pt returned to OR for abdominal wound closure on 3/17 and found to be septic. Pt PMH includes but is not limited to: anxiety, benign liver cycst, chronic intestinal pseudo-obstruction, and GERD.  Clinical Impression  Pt admitted with above diagnosis.  Pt currently with functional limitations due to the deficits listed below (see PT Problem Stacy [name]). Pt seated in recliner when PT arrived. Spouse present. Pt indicated 8/10 abdominal pain. Pt required cues for technique, sequencing and abdominal precautions with mobility tasks. Min A for sit to stand  from recliner, min A for safety with SPT to return to bed, CGA to scoot laterally in sitting toward HOB and mod A for sit to supine. Pt left in bed, all needs in place, spouse present. Pt will benefit from [facility] services following hospital d/c. Pt will benefit from acute skilled PT to increase their independence and safety with mobility to allow discharge.         If plan is discharge home, recommend the following: A little help with walking and/or transfers;A little help with bathing/dressing/bathroom;Assistance with cooking/housework;Assist for transportation;Help with stairs or ramp for entrance   Can travel by private vehicle        Equipment Recommendations None recommended by PT  Recommendations for  Other Services  OT consult    Functional Status Assessment Patient has had a recent decline in their functional status and demonstrates the ability to make significant improvements in function in a reasonable and predictable amount of time.     Precautions / Restrictions Precautions Precautions: Fall (abdominal binder and precuations) Restrictions Weight Bearing Restrictions Per Provider Order: No      Mobility  Bed Mobility Overal bed mobility: Needs Assistance Bed Mobility: Sit to Supine       Sit to supine: Mod assist   General bed mobility comments: cues to maintain abdominal precuations    Transfers Overall transfer level: Needs assistance Equipment used: 1 person hand held assist Transfers: Bed to chair/wheelchair/BSC   Stand pivot transfers: Min assist         General transfer comment: min cues and increased time with A to manage lines    Ambulation/Gait               General Gait Details: NT  Stairs            Wheelchair Mobility     Tilt Bed    Modified Rankin (Stroke Patients Only)       Balance Overall balance assessment: Mild deficits observed, not formally tested (no fall hx)                                           Pertinent Vitals/Pain Pain Assessment Pain Assessment: 0-10 Pain Score: 8  Pain Location: abdomen Pain Descriptors / Indicators: Aching, Constant, Discomfort, Dull, Grimacing, Operative site guarding,  Tender, Sore Pain Intervention(s): Limited activity within patient's tolerance, Monitored during session, Premedicated before session, Repositioned, PCA encouraged    Home Living Family/patient expects to be discharged to:: Private residence Living Arrangements: Spouse/significant other;Children Available Help at Discharge: Family Type of Home: House Home Access: Stairs to enter   Secretary/administrator of Steps: 2   Home Layout: Able to live on main level with bedroom/bathroom    Additional Comments: 83 and 51 yo children    Prior Function Prior Level of Function : Independent/Modified Independent             Mobility Comments: IND no AD for all ADLs, self care tasks and IADLs       Extremity/Trunk Assessment        Lower Extremity Assessment Lower Extremity Assessment: Generalized weakness    Cervical / Trunk Assessment Cervical / Trunk Assessment: Normal  Communication   Communication Communication: No apparent difficulties    Cognition Arousal: Alert Behavior During Therapy: WFL for tasks assessed/performed   PT - Cognitive impairments: No apparent impairments                         Following commands: Intact       Cueing       General Comments General comments (skin integrity, edema, etc.): B UE edema noted    Exercises     Assessment/Plan    PT Assessment Patient needs continued PT services  PT Problem Stacy [name] Decreased strength;Decreased activity tolerance;Decreased balance;Decreased mobility;Pain       PT Treatment Interventions Gait training;Stair training;Functional mobility training;Therapeutic activities;Therapeutic exercise;Balance training;Neuromuscular re-education;Patient/family education    PT Goals (Current goals can be found in the Care Plan section)  Acute Rehab PT Goals Patient Stated Goal: to be able to get some rest, recover and go home PT Goal Formulation: With patient Time For Goal Achievement: 11/22/23 Potential to Achieve Goals: Fair    Frequency Min 3X/week     Co-evaluation               AM-PAC PT "6 Clicks" Mobility  Outcome Measure Help needed turning from your back to your side while in a flat bed without using bedrails?: A Little Help needed moving from lying on your back to sitting on the side of a flat bed without using bedrails?: A Little Help needed moving to and from a bed to a chair (including a wheelchair)?: A Little Help needed standing up from a chair using your  arms (e.g., wheelchair or bedside chair)?: A Little Help needed to walk in hospital room?: Total Help needed climbing 3-5 steps with a railing? : Total 6 Click Score: 14    End of Session Equipment Utilized During Treatment: Other (comment) (abdominal binder) Activity Tolerance: Patient limited by fatigue Patient left: in bed;with call bell/phone within reach;with family/visitor present Nurse Communication: Mobility status PT Visit Diagnosis: Unsteadiness on feet (R26.81);Muscle weakness (generalized) (M62.81);Difficulty in walking, not elsewhere classified (R26.2);Pain Pain - part of body:  (abdomen)    Time: 1308-6578 PT Time Calculation (min) (ACUTE ONLY): 18 min   Charges:   PT Evaluation $PT Eval Moderate Complexity: 1 Mod   PT General Charges $$ ACUTE PT VISIT: 1 Visit         Johnny Bridge, PT Acute Rehab   Jacqualyn Posey 11/02/2023, 12:18 PM

## 2023-11-02 NOTE — Progress Notes (Signed)
 Nutrition Follow-up  DOCUMENTATION CODES:   Non-severe (moderate) malnutrition in context of chronic illness  INTERVENTION:  - Continue TPN.   - TPN management per pharmacy.   - Monitor magnesium, potassium, and phosphorus BID for at least 3 days, MD to replete as needed, as pt is at risk for refeeding syndrome.  - Will monitor for diet advancement.   NUTRITION DIAGNOSIS:   Moderate Malnutrition related to chronic illness as evidenced by mild fat depletion, mild muscle depletion. *ongoing  GOAL:   Patient will meet greater than or equal to 90% of their needs *progressing, on TPN  MONITOR:   Vent status, Labs, Weight trends, I & O's  REASON FOR ASSESSMENT:   Ventilator    ASSESSMENT:   43 year old woman with a PMH of chronic intestinal pseudoobstruction who presented with severe abdominal pain. Admitted for perforated colon.  3/15 Admit; ex-lap, found to have perforation at mid transverse colon, mesenteric mass and proximal transverse colon, s/p extended right hemicolectomy, wound VAC placement, Left in discontinuity, returned to ICU intubated postop  3/17 re-exploration of abdomen, cholecystectomy, ileocolonic anastomosis; extubated; PICC placed 3/18 TPN initiated  Patient in bedside chair at time of visit, father at bedside. NGT in place, remains to LIS. .  TPN started last night. Discussed that TPN currently with no lipids as triglycerides remain high.  Continue to advance to meet 100% of needs. Monitoring for ability to add back in triglycerides.   No bowel function yet but patient currently hungry. Will monitor for diet advancement.   Admit weight: 110# Current weight: 119# I&O's: +8.8L + for mild pitting generalized edema  Medications reviewed and include: 100mg  thiamine  Labs reviewed:  Phosphorus 2.2 Triglycerides 640 (as of 3/18)   Diet Order:   Diet Order             Diet NPO time specified  Diet effective now                    EDUCATION NEEDS:  Education needs have been addressed  Skin:  Skin Assessment: Skin Integrity Issues: Skin Integrity Issues:: Incisions Incisions: Abdomen  Last BM:  PTA  Height:  Ht Readings from Last 1 Encounters:  10/29/23 5\' 3"  (1.6 m)   Weight:  Wt Readings from Last 1 Encounters:  11/02/23 54 kg    BMI:  Body mass index is 21.09 kg/m.  Estimated Nutritional Needs:  Kcal:  1600-1750 kcals Protein:  85-100 grams Fluid:  >/= 1.6L    Shelle Iron RD, LDN Contact via Secure Chat.

## 2023-11-03 ENCOUNTER — Ambulatory Visit: Payer: 59

## 2023-11-03 DIAGNOSIS — K631 Perforation of intestine (nontraumatic): Secondary | ICD-10-CM | POA: Diagnosis not present

## 2023-11-03 DIAGNOSIS — K567 Ileus, unspecified: Secondary | ICD-10-CM | POA: Insufficient documentation

## 2023-11-03 DIAGNOSIS — R7881 Bacteremia: Secondary | ICD-10-CM | POA: Insufficient documentation

## 2023-11-03 LAB — COMPREHENSIVE METABOLIC PANEL
ALT: 14 U/L (ref 0–44)
AST: 14 U/L — ABNORMAL LOW (ref 15–41)
Albumin: 1.6 g/dL — ABNORMAL LOW (ref 3.5–5.0)
Alkaline Phosphatase: 52 U/L (ref 38–126)
Anion gap: 4 — ABNORMAL LOW (ref 5–15)
BUN: 18 mg/dL (ref 6–20)
CO2: 23 mmol/L (ref 22–32)
Calcium: 7.6 mg/dL — ABNORMAL LOW (ref 8.9–10.3)
Chloride: 112 mmol/L — ABNORMAL HIGH (ref 98–111)
Creatinine, Ser: 0.38 mg/dL — ABNORMAL LOW (ref 0.44–1.00)
GFR, Estimated: >60 mL/min (ref >60)
Glucose, Bld: 121 mg/dL — ABNORMAL HIGH (ref 70–99)
Potassium: 4 mmol/L (ref 3.5–5.1)
Sodium: 139 mmol/L (ref 135–145)
Total Bilirubin: 0.6 mg/dL (ref 0.0–1.2)
Total Protein: 4.6 g/dL — ABNORMAL LOW (ref 6.5–8.1)

## 2023-11-03 LAB — CULTURE, BLOOD (ROUTINE X 2): Special Requests: ADEQUATE

## 2023-11-03 LAB — CBC
HCT: 31.6 % — ABNORMAL LOW (ref 36.0–46.0)
Hemoglobin: 9.6 g/dL — ABNORMAL LOW (ref 12.0–15.0)
MCH: 28.8 pg (ref 26.0–34.0)
MCHC: 30.4 g/dL (ref 30.0–36.0)
MCV: 94.9 fL (ref 80.0–100.0)
Platelets: 249 10*3/uL (ref 150–400)
RBC: 3.33 MIL/uL — ABNORMAL LOW (ref 3.87–5.11)
RDW: 15 % (ref 11.5–15.5)
WBC: 12.8 10*3/uL — ABNORMAL HIGH (ref 4.0–10.5)
nRBC: 0 % (ref 0.0–0.2)

## 2023-11-03 LAB — GLUCOSE, CAPILLARY: Glucose-Capillary: 109 mg/dL — ABNORMAL HIGH (ref 70–99)

## 2023-11-03 LAB — TRIGLYCERIDES: Triglycerides: 99 mg/dL (ref <150)

## 2023-11-03 LAB — MAGNESIUM: Magnesium: 1.6 mg/dL — ABNORMAL LOW (ref 1.7–2.4)

## 2023-11-03 LAB — PHOSPHORUS: Phosphorus: 4.1 mg/dL (ref 2.5–4.6)

## 2023-11-03 MED ORDER — MORPHINE SULFATE 1 MG/ML IV SOLN PCA
INTRAVENOUS | Status: DC
  Administered 2023-11-04: 1 mg via INTRAVENOUS
  Administered 2023-11-05: 15.02 mg via INTRAVENOUS
  Administered 2023-11-05: 7.5 mg via INTRAVENOUS
  Administered 2023-11-05: 14.54 mg via INTRAVENOUS
  Administered 2023-11-06: 24.26 mg via INTRAVENOUS
  Administered 2023-11-06: 9.22 mg via INTRAVENOUS
  Administered 2023-11-06: 9 mg via INTRAVENOUS
  Administered 2023-11-07: 4.5 mg via INTRAVENOUS
  Administered 2023-11-07: 12.1 mg via INTRAVENOUS
  Administered 2023-11-07: 25.6 mg via INTRAVENOUS
  Administered 2023-11-08 (×3): 6 mg via INTRAVENOUS
  Administered 2023-11-08 – 2023-11-09 (×2): 7.5 mg via INTRAVENOUS
  Administered 2023-11-09 – 2023-11-10 (×2): 3 mg via INTRAVENOUS
  Administered 2023-11-10: 6 mg via INTRAVENOUS
  Administered 2023-11-10: 1.5 mg via INTRAVENOUS
  Administered 2023-11-10: 4.5 mg via INTRAVENOUS
  Administered 2023-11-10: 9 mg via INTRAVENOUS
  Administered 2023-11-11: 7.5 mg via INTRAVENOUS
  Administered 2023-11-11: 4.5 mg via INTRAVENOUS
  Filled 2023-11-03 (×20): qty 30

## 2023-11-03 MED ORDER — SODIUM CHLORIDE 0.9 % IV SOLN: INTRAVENOUS | Status: AC | PRN

## 2023-11-03 MED ORDER — MAGNESIUM SULFATE 2 GM/50ML IV SOLN
2.0000 g | Freq: Once | INTRAVENOUS | Status: AC
  Administered 2023-11-03: 2 g via INTRAVENOUS
  Filled 2023-11-03: qty 50

## 2023-11-03 MED ORDER — METHOCARBAMOL 1000 MG/10ML IJ SOLN
1000.0000 mg | Freq: Three times a day (TID) | INTRAMUSCULAR | Status: DC
  Administered 2023-11-03 – 2023-11-15 (×36): 1000 mg via INTRAVENOUS
  Filled 2023-11-03 (×36): qty 10

## 2023-11-03 MED ORDER — ACETAMINOPHEN 10 MG/ML IV SOLN
1000.0000 mg | Freq: Four times a day (QID) | INTRAVENOUS | Status: AC
Start: 2023-11-03 — End: 2023-11-04
  Administered 2023-11-03 – 2023-11-04 (×4): 1000 mg via INTRAVENOUS
  Filled 2023-11-03 (×4): qty 100

## 2023-11-03 MED ORDER — TRAVASOL 10 % IV SOLN
INTRAVENOUS | Status: AC
  Filled 2023-11-03: qty 924

## 2023-11-03 NOTE — Progress Notes (Signed)
  Progress Note   Patient: Stacy [name] ZOX:[id number] DOB: Jul 14, 1981 DOA: 10/29/2023     5 DOS: the patient was seen and examined on 11/03/2023 at 10:40AM      Brief hospital course: 43 y.o. F with hx chronic pseudoobstruction, underweight, iron deficiency anemia and orthostatic hypotension who presented with severe abdominal pain, CT showed perforated colon.     Assessment and Plan: * Colon perforation [facility]) S/p ex lap and RIGHT colectomy left in discontinuity by Dr. Fredricka Bonine on 3/15 S/p ex lap with primary ileocolonic anastomosis and cholecystectomy by Dr. Andrey Campanile 3/17  Post-operative Ileus No bowel activity yet.  Urine also retaining. - TPN per Surgery - Analgesics per Surgery - NG per surgery - Dressings per Surgery - Continue micafungin and Zosyn    Positive blood culture CoNS in 1/2, likely contaminant, no further work up.  Malnutrition of moderate degree Ileus - Continue TPN - Keep K>4, mag>2, phos>3 - Continue coverage SS insulin - Can resume home Megace at discharge  Chronic orthostatic hypotension Chronically on midodrine and Florinef. - Resume Florinef and midodrine when able to take PO - Continue IV fluids  Normocytic anemia Iron deficiency anemia           Subjective: Worse pain overnight, improved with adjusting PCA settings.  This morning feeling well, not having any voiding.  No flatus or BM as well.  No confusion, no fever.  Her pain is located in the abdomen.     Physical Exam: BP 109/68   Pulse (!) 104   Temp 98.1 F (36.7 C) (Oral)   Resp 12   Ht 5\' 3"  (1.6 m)   Wt 57 kg   SpO2 98%   BMI 22.26 kg/m   Thin adult female, lying in bed, NG tube in place, makes eye contact Tachycardic, regular, diffuse anasarca Respiratory rate seems normal, lungs diminished but no rales or wheezes appreciated The abdomen incision is covered, but the dressings are clean dry and intact.  She has moderate tenderness to palpation of the abdomen  no involuntary guarding Attention normal, oriented to situation, face symmetric, speech fluent, left eyelid droop    Data Reviewed: Basic metabolic panel unremarkable, LFTs normal Magnesium low, mild hypomagnesemia, magnesium supplemented White blood cell count slightly up to 12, hemoglobin slightly down to 9.6 Blood cultures no growth  Family Communication: Husband and sister at the bedside    Disposition: Status is: Inpatient         Author: Alberteen Sam, MD 11/03/2023 12:53 PM  For on call review [email or web address].

## 2023-11-03 NOTE — Assessment & Plan Note (Signed)
 S/p ex lap and RIGHT colectomy left in discontinuity by Dr. Fredricka Bonine on 3/15 S/p ex lap with primary ileocolonic anastomosis and cholecystectomy by Dr. Andrey Campanile 3/17  Post-operative Ileus No bowel activity yet.  Urine also retaining. - TPN per Surgery - Analgesics per Surgery - NG per surgery - Dressings per Surgery - Continue micafungin and Zosyn

## 2023-11-03 NOTE — Plan of Care (Signed)
  Problem: Education: Goal: Knowledge of General Education information will improve Description: Including pain rating scale, medication(s)/side effects and non-pharmacologic comfort measures Outcome: Progressing   Problem: Clinical Measurements: Goal: Respiratory complications will improve Outcome: Progressing Goal: Cardiovascular complication will be avoided Outcome: Progressing   Problem: Nutrition: Goal: Adequate nutrition will be maintained Outcome: Progressing   Problem: Coping: Goal: Level of anxiety will decrease Outcome: Progressing   Problem: Pain Managment: Goal: General experience of comfort will improve and/or be controlled Outcome: Progressing

## 2023-11-03 NOTE — Assessment & Plan Note (Signed)
Iron deficiency anemia

## 2023-11-03 NOTE — Assessment & Plan Note (Addendum)
 Ileus - Continue TPN - Keep K>4, mag>2, phos>3 - Continue coverage SS insulin - Can resume home Megace at discharge

## 2023-11-03 NOTE — Progress Notes (Signed)
 3 Days Post-Op  Subjective: CC: Her husband and parents is at bedside.   Having generalized abdomianl pain that is greatest around her incision. PCA increased overnight. Pain better controlled. No nausea. NGT output bilious, 1.5L/24 hours. No flatus or bm. Foley out. Required I/O x 2 over the last 24 hours with the last at 5am. Has not voided. Oob to chair yesterday.   Afebrile. Tachycardic in the low 100's. BP 119/74 on last check. Off pressors. WBC 12.8 (10.5). Hgb 9.6 (10.1). CMP pending.    Objective: Vital signs in last 24 hours: Temp:  [97.8 F (36.6 C)-98.2 F (36.8 C)] 97.8 F (36.6 C) (03/20 0800) Pulse Rate:  [98-108] 103 (03/20 0800) Resp:  [11-21] 14 (03/20 0818) BP: (93-133)/(58-91) 119/74 (03/20 0800) SpO2:  [94 %-99 %] 96 % (03/20 0800) Weight:  [57 kg] 57 kg (03/20 0443) Last BM Date :  (PTA)  Intake/Output from previous day: 03/19 0701 - 03/20 0700 In: 2058.6 [I.V.:1156.1; IV Piggyback:902.5] Out: 2750 [Urine:1250; Emesis/NG output:1500] Intake/Output this shift: Total I/O In: 57.4 [I.V.:47; IV Piggyback:10.4] Out: -   PE: Gen:  Alert, NAD, pleasant Abd: Soft, mild distension, generalized ttp greatest around her incision, RLQ and left hemiabdomen without rigidity or guarding. More active but still hypoactive BS. NGT bilious. Midline wound clean and stable from picture 3/19  Lab Results:  Recent Labs    11/02/23 0630 11/03/23 0451  WBC 10.5 12.8*  HGB 10.1* 9.6*  HCT 33.4* 31.6*  PLT 263 249   BMET Recent Labs    11/01/23 1418 11/02/23 0630  NA 140 140  K 3.6 4.3  CL 110 111  CO2 24 24  GLUCOSE 137* 122*  BUN 14 17  CREATININE 0.54 0.51  CALCIUM 7.7* 7.8*   PT/INR No results for input(s): "LABPROT", "INR" in the last 72 hours. CMP     Component Value Date/Time   NA 140 11/02/2023 0630   NA 137 01/15/2019 0000   K 4.3 11/02/2023 0630   CL 111 11/02/2023 0630   CO2 24 11/02/2023 0630   GLUCOSE 122 (H) 11/02/2023 0630   BUN 17  11/02/2023 0630   BUN 15 01/15/2019 0000   CREATININE 0.51 11/02/2023 0630   CALCIUM 7.8 (L) 11/02/2023 0630   PROT 4.6 (L) 11/02/2023 0630   ALBUMIN 1.7 (L) 11/02/2023 0630   AST 16 11/02/2023 0630   ALT 17 11/02/2023 0630   ALKPHOS 56 11/02/2023 0630   BILITOT 1.0 11/02/2023 0630   GFRNONAA >60 11/02/2023 0630   GFRAA >60 05/20/2020 0502   Lipase     Component Value Date/Time   LIPASE 21 04/29/2020 0315    Studies/Results: No results found.  Anti-infectives: Anti-infectives (From admission, onward)    Start     Dose/Rate Route Frequency Ordered Stop   10/31/23 1100  micafungin (MYCAMINE) 100 mg in sodium chloride 0.9 % 100 mL IVPB        100 mg 105 mL/hr over 1 Hours Intravenous Daily 10/31/23 1031     10/29/23 1800  piperacillin-tazobactam (ZOSYN) IVPB 3.375 g        3.375 g 12.5 mL/hr over 240 Minutes Intravenous Every 8 hours 10/29/23 0918     10/29/23 0930  piperacillin-tazobactam (ZOSYN) IVPB 3.375 g        3.375 g 100 mL/hr over 30 Minutes Intravenous STAT 10/29/23 0918 10/29/23 1005   10/29/23 0615  ceFEPIme (MAXIPIME) 2 g in sodium chloride 0.9 % 100 mL IVPB  2 g 200 mL/hr over 30 Minutes Intravenous  Once 10/29/23 0610 10/29/23 0703   10/29/23 0615  metroNIDAZOLE (FLAGYL) IVPB 500 mg        500 mg 100 mL/hr over 60 Minutes Intravenous  Once 10/29/23 0610 10/29/23 0825        Assessment/Plan POD 5/3, ex lap with extended right colectomy for perforated transverse colon, Dr. Fredricka Bonine, 3/15, ex lap with ileocolonic anastomosis and cholecystectomy Dr. Andrey Campanile 3/17  - Path w/ diverticulitis and chronic cholecystitis/focal adenomyomatous change - Significant contamination.  Cont abx therapy for likely 5-7 days post op and monitor for post op infection. - Cont NGT, expect post op ileus. Cont TPN - BID WD dressing changes to midline wound - Cont PCA. IV tylenol and robaxin - Keep K >=4, Phos >= 3, Mg >= 2 and mobilize for bowel function - PT to help start  mobilizing - Pulm toilet/IS - Updated her husband at bedside. Discussed w/ RN in person.    FEN - NPO, NGT to LIWS, TPN, IVF per primary  VTE - SCDs, Lovenox ID - Zosyn/Micafungin. Afebrile. WBC 12.8 (10.5) Foley - out 3/18. I/O x 2. Replace foley if unable to void 6 hours after last I/O.  Staph Capitis +Bcx   LOS: 5 days    Jacinto Halim, [facility] Surgery 11/03/2023, 8:22 AM Please see Amion for pager number during day hours 7:00am-4:30pm

## 2023-11-03 NOTE — Progress Notes (Signed)
 PHARMACY - TOTAL PARENTERAL NUTRITION CONSULT NOTE   Indication: Prolonged ileus  Patient Measurements: Height: 5\' 3"  (160 cm) Weight: 57 kg (125 lb 10.6 oz) IBW/kg (Calculated) : 52.4 TPN AdjBW (KG): 50 Body mass index is 22.26 kg/m.  Assessment: 60 yoF admitted on 3/15 with abdominal pain, found to have pneumoperitoneum, colon perforation, mesenteric mass, diffuse/severe peritonitis, and distended gallbladder.  She is s/p OR on 3/15 and 3/17.  Pharmacy is consulted to dose TPN.    Glucose / Insulin: No hx DM -Hypoglycemic requiring D10 infusion on 3/16 prior to TPN -BG goal <180. Range 107 - 122 (no SSI required) Electrolytes: Mg (1.6) low. All other electrolytes WNL, including CorrCa (9.5) -Goal: K >/= 4, Mg >/= 2, Phos >/= 3 Renal: SCr < 1, BUN WNL  Hepatic: AST/ALT, Tbili - WNL, Albumin 1.6 low  -TG now WNL. Suspect previously high levels related to previous high rate propofol   Intake / Output; MIVF: No mIVF -UOP: 1250 mL, NG output: 1500 mL GI Imaging: GI Surgeries / Procedures:  - 3/15 right colectomy for perforated transverse colon with proximal mesenteric mass   - 3/17 Ileocolonic anastomosis, cholecystectomy, and abdominal closure   Central access: PICC 3/17 TPN start date: 3/18   Nutritional Goals: Goal TPN rate is 70 mL/hr (provides 92 g of protein and 1662 kcals per day)  RD Assessment: Estimated Needs Total Energy Estimated Needs: 1600-1750 kcals Total Protein Estimated Needs: 85-100 grams Total Fluid Estimated Needs: >/= 1.6L  Current Nutrition:  NPO, TPN  Plan:  Now:  Mg 2 g IV once  At 1800: Increase TPN to goal rate of 70 mL/hr Add lipids to TPN since hypertriglyceridemia has resolved Electrolytes in TPN: No changes Na 50 mEq/L, K 50 mEq/L, Ca 5 mEq/L, Mg 5 mEq/L, and Phos 15 mmol/L.  Cl:Ac 1:1 Add standard MVI and trace elements to TPN Continue CBG + sensitive SSI q8h and adjust as needed  mIVF management per MD Monitor TPN labs on  Mon/Thurs and prn. Check electrolytes with AM labs tomorrow.  Thiamine 100mg  IV x5 days through 3/22  Cindi Carbon, PharmD 11/03/23 7:18 AM

## 2023-11-03 NOTE — Assessment & Plan Note (Signed)
 Chronically on midodrine and Florinef. - Resume Florinef and midodrine when able to take PO - Continue IV fluids

## 2023-11-03 NOTE — Assessment & Plan Note (Signed)
 CoNS in 1/2, likely contaminant, no further work up.

## 2023-11-03 NOTE — Plan of Care (Signed)
  Problem: Health Behavior/Discharge Planning: Goal: Ability to manage health-related needs will improve Outcome: Progressing   Problem: Activity: Goal: Risk for activity intolerance will decrease Outcome: Progressing   Problem: Coping: Goal: Level of anxiety will decrease Outcome: Progressing   Problem: Pain Managment: Goal: General experience of comfort will improve and/or be controlled Outcome: Progressing   Problem: Activity: Goal: Ability to tolerate increased activity will improve Outcome: Progressing   Problem: Elimination: Goal: Will not experience complications related to urinary retention Outcome: Not Progressing

## 2023-11-03 NOTE — Progress Notes (Addendum)
 eLink Physician-Brief Progress Note Patient Name: Stacy [name] DOB: 07/19/1981 MRN: [id number]   Date of Service  11/03/2023  HPI/Events of Note  Patient with complex intra-abdominal surgical history with concern for peritonitis.  Currently on a morphine PCA.  Continues to have 10 out of 10 pain despite morphine PCA.  The patient has had 9-10 out of 10 pain while awake/asleep throughout the day with no change despite PCA.  eICU Interventions  Will escalate PCA somewhat, but may need to consider alternatives in the a.m. if this fails.   4098 -bladder scan consistent with urinary retention.  In-N-Out cath as needed.  Standard retention guidelines.  Intervention Category Intermediate Interventions: Pain - evaluation and management  [name] 11/03/2023, 2:24 AM

## 2023-11-03 NOTE — Hospital Course (Addendum)
 43 y.o. F with hx chronic constipation, SIBO (treated years ago), underweight, iron deficiency anemia and orthostatic hypotension who presented with severe abdominal pain, CT showed perforated colon.  Status post surgery.  Consultants Critical care General Surgery  Procedures/Events 3/15 admitted by critical care 3/15 exploratory laparotomy, extended right hemicolectomy, application of ABThera wound VAC  3/17 reexploration of abdomen, cholecystectomy, ileocolonic anastomosis  Johnny Bridge, I saw your note and Dr. Ricky Stabs signature.  I do not see that she has been seen by GI here, which group/person should she follow-up with?  Does she need pain medications on discharge?  If so can you prescribe please?  I thought to stop Motegrity.  She is to stop Reglan on discharge as well?

## 2023-11-04 ENCOUNTER — Inpatient Hospital Stay (HOSPITAL_COMMUNITY)

## 2023-11-04 DIAGNOSIS — K631 Perforation of intestine (nontraumatic): Secondary | ICD-10-CM | POA: Diagnosis not present

## 2023-11-04 LAB — PHOSPHORUS: Phosphorus: 4.1 mg/dL (ref 2.5–4.6)

## 2023-11-04 LAB — BASIC METABOLIC PANEL
Anion gap: 4 — ABNORMAL LOW (ref 5–15)
BUN: 19 mg/dL (ref 6–20)
CO2: 23 mmol/L (ref 22–32)
Calcium: 7.7 mg/dL — ABNORMAL LOW (ref 8.9–10.3)
Chloride: 115 mmol/L — ABNORMAL HIGH (ref 98–111)
Creatinine, Ser: 0.36 mg/dL — ABNORMAL LOW (ref 0.44–1.00)
GFR, Estimated: >60 mL/min (ref >60)
Glucose, Bld: 123 mg/dL — ABNORMAL HIGH (ref 70–99)
Potassium: 4.1 mmol/L (ref 3.5–5.1)
Sodium: 142 mmol/L (ref 135–145)

## 2023-11-04 LAB — CBC
HCT: 29.2 % — ABNORMAL LOW (ref 36.0–46.0)
Hemoglobin: 9 g/dL — ABNORMAL LOW (ref 12.0–15.0)
MCH: 28.8 pg (ref 26.0–34.0)
MCHC: 30.8 g/dL (ref 30.0–36.0)
MCV: 93.3 fL (ref 80.0–100.0)
Platelets: 280 10*3/uL (ref 150–400)
RBC: 3.13 MIL/uL — ABNORMAL LOW (ref 3.87–5.11)
RDW: 15.1 % (ref 11.5–15.5)
WBC: 14.1 10*3/uL — ABNORMAL HIGH (ref 4.0–10.5)
nRBC: 0 % (ref 0.0–0.2)

## 2023-11-04 LAB — MAGNESIUM: Magnesium: 1.8 mg/dL (ref 1.7–2.4)

## 2023-11-04 LAB — IRON AND TIBC
Iron: 8 ug/dL — ABNORMAL LOW (ref 28–170)
Saturation Ratios: 6 % — ABNORMAL LOW (ref 10.4–31.8)
TIBC: 129 ug/dL — ABNORMAL LOW (ref 250–450)
UIBC: 121 ug/dL

## 2023-11-04 LAB — GLUCOSE, CAPILLARY
Glucose-Capillary: 112 mg/dL — ABNORMAL HIGH (ref 70–99)
Glucose-Capillary: 116 mg/dL — ABNORMAL HIGH (ref 70–99)
Glucose-Capillary: 125 mg/dL — ABNORMAL HIGH (ref 70–99)

## 2023-11-04 LAB — FERRITIN: Ferritin: 357 ng/mL — ABNORMAL HIGH (ref 11–307)

## 2023-11-04 MED ORDER — FUROSEMIDE 10 MG/ML IJ SOLN
20.0000 mg | Freq: Once | INTRAMUSCULAR | Status: AC
  Administered 2023-11-04: 20 mg via INTRAVENOUS
  Filled 2023-11-04: qty 2

## 2023-11-04 MED ORDER — ACETAMINOPHEN 10 MG/ML IV SOLN
1000.0000 mg | Freq: Four times a day (QID) | INTRAVENOUS | Status: AC
Start: 2023-11-04 — End: 2023-11-05
  Administered 2023-11-04 – 2023-11-05 (×4): 1000 mg via INTRAVENOUS
  Filled 2023-11-04 (×3): qty 100

## 2023-11-04 MED ORDER — DIPHENHYDRAMINE HCL 12.5 MG/5ML PO ELIX: 25.0000 mg | ORAL_SOLUTION | Freq: Four times a day (QID) | ORAL | Status: DC | PRN

## 2023-11-04 MED ORDER — FAMOTIDINE IN NACL 20-0.9 MG/50ML-% IV SOLN
20.0000 mg | Freq: Once | INTRAVENOUS | Status: AC
  Administered 2023-11-04: 20 mg via INTRAVENOUS
  Filled 2023-11-04: qty 50

## 2023-11-04 MED ORDER — MAGNESIUM SULFATE 2 GM/50ML IV SOLN
2.0000 g | Freq: Once | INTRAVENOUS | Status: AC
  Administered 2023-11-04: 2 g via INTRAVENOUS
  Filled 2023-11-04: qty 50

## 2023-11-04 MED ORDER — SODIUM CHLORIDE 0.9 % IV SOLN
100.0000 mg | Freq: Once | INTRAVENOUS | Status: AC
  Administered 2023-11-04: 100 mg via INTRAVENOUS
  Filled 2023-11-04: qty 5

## 2023-11-04 MED ORDER — DIPHENHYDRAMINE HCL 50 MG/ML IJ SOLN
25.0000 mg | Freq: Four times a day (QID) | INTRAMUSCULAR | Status: DC | PRN
  Administered 2023-11-05 (×2): 25 mg via INTRAVENOUS
  Filled 2023-11-04 (×3): qty 1

## 2023-11-04 MED ORDER — TRAVASOL 10 % IV SOLN
INTRAVENOUS | Status: AC
  Filled 2023-11-04: qty 924

## 2023-11-04 NOTE — Plan of Care (Signed)
  Problem: Clinical Measurements: Goal: Diagnostic test results will improve Outcome: Progressing Goal: Respiratory complications will improve Outcome: Progressing   Problem: Pain Managment: Goal: General experience of comfort will improve and/or be controlled Outcome: Progressing   Problem: Activity: Goal: Ability to tolerate increased activity will improve Outcome: Progressing

## 2023-11-04 NOTE — Progress Notes (Signed)
  Progress Note   Patient: Stacy [name] WUJ:[id number] DOB: Mar 02, 1981 DOA: 10/29/2023     6 DOS: the patient was seen and examined on 11/04/2023        Brief hospital course: 43 y.o. F with hx chronic pseudoobstruction, underweight, iron deficiency anemia and orthostatic hypotension who presented with severe abdominal pain, CT showed perforated colon.     Assessment and Plan: * Colon perforation [facility]) S/p ex lap and RIGHT colectomy left in discontinuity by Dr. Fredricka Bonine on 3/15 S/p ex lap with primary ileocolonic anastomosis and cholecystectomy by Dr. Andrey Campanile 3/17  Post-operative Ileus No flatus.  Walking better. - TPN per Surgery - Analgesics per Surgery - NG per surgery - Dressings per Surgery - Continue micafungin and Zosyn day 4 post-op    Dyspnea Left pleural effusion Noted some coughing, chest congestion last 24 hours.  Very anasarcic.  CXR shows small left effusion, likely due to hypoalbuminemia - Trial IV Lasix - Daily BMP  Chronic orthostatic hypotension Chronically on midodrine and Florinef. - Resume Florinef and midodrine when able to take PO  Normocytic anemia Iron deficiency anemia B12 deficiency ruled out.   - IV iron          Subjective: No flatus or bowel movement.  Good energy, ambulating a little bit.  No fever.  She is feeling congested, still has anasarca.  No hypoxia or orthopnea.     Physical Exam: BP 114/78 (BP Location: Left Arm)   Pulse (!) 110   Temp 98.1 F (36.7 C) (Oral)   Resp 14   Ht 5\' 3"  (1.6 m)   Wt 51.7 kg   SpO2 96%   BMI 20.19 kg/m   Adult female, lying in bed, NG in place, interactive and appropriate Heart tachycardic, regular, diffuse anasarca, no murmurs obvious Respiratory rate normal, lungs diminished but no rales or wheezes appreciated Abdomen soft, tender throughout, but no guarding, no rigidity Attention normal, affect pleasant, judgment and insight appear normal, face symmetric    Data  Reviewed: Discussed with general surgery CBC shows white count up to 14, hemoglobin slightly down at 9 Ferritin elevated but iron saturation extremely low Magnesium and phosphorus okay, creatinine normal Calcium corrects to normal   Family Communication: Husband, parents at the bedside    Disposition: Status is: Inpatient         Author: Alberteen Sam, MD 11/04/2023 1:01 PM  For on call review [email or web address].

## 2023-11-04 NOTE — Progress Notes (Signed)
 PHARMACY - TOTAL PARENTERAL NUTRITION CONSULT NOTE   Indication: Prolonged ileus  Patient Measurements: Height: 5\' 3"  (160 cm) Weight: 51.7 kg (113 lb 15.7 oz) IBW/kg (Calculated) : 52.4 TPN AdjBW (KG): 51.7 Body mass index is 20.19 kg/m.  Assessment: 65 yoF admitted on 3/15 with abdominal pain, found to have pneumoperitoneum, colon perforation, mesenteric mass, diffuse/severe peritonitis, and distended gallbladder.  She is s/p OR on 3/15 and 3/17.  Pharmacy is consulted to dose TPN.    Glucose / Insulin: No hx DM -Hypoglycemic requiring D10 infusion on 3/16 prior to TPN -BG goal <180. Range 109-125 (no SSI required) Electrolytes: WNL, except Cl elevated, Mag 1.8 -Goal: K >/= 4, Mg >/= 2, Phos >/= 3 Renal: SCr < 1, BUN WNL  Hepatic: AST/ALT, Tbili - WNL, Albumin 1.6 low  -TG now WNL. Suspect previously high levels related to propofol   Intake / Output; MIVF: No mIVF -UOP: 1275 mL, NG output: 1100 mL GI Imaging: GI Surgeries / Procedures:  - 3/15 right colectomy for perforated transverse colon with proximal mesenteric mass   - 3/17 Ileocolonic anastomosis, cholecystectomy, and abdominal closure   Central access: PICC 3/17 TPN start date: 3/18   Nutritional Goals: Goal TPN rate is 70 mL/hr (provides 92 g of protein and 1730 kcals per day)  RD Assessment: Estimated Needs Total Energy Estimated Needs: 1600-1750 kcals Total Protein Estimated Needs: 85-100 grams Total Fluid Estimated Needs: >/= 1.6L  Current Nutrition:  NPO, TPN  Plan:  Now: Mag 2g IV x1  At 1800: Continue TPN at goal rate of 70 mL/hr Electrolytes in TPN: No changes Na 50 mEq/L, K 50 mEq/L, Ca 5 mEq/L, Mg 5 mEq/L, and Phos 15 mmol/L.  Cl:Ac 1:2  Add standard MVI and trace elements to TPN Continue CBG + sensitive SSI q8h and adjust as needed  mIVF management per MD Monitor TPN labs on Mon/Thurs and prn. Bmet and Mag with AM labs.  Thiamine 100mg  IV x5 days through 3/22   [facility] PharmD,  BCPS [facility] main pharmacy [phone] 11/04/2023 7:26 AM

## 2023-11-04 NOTE — Plan of Care (Signed)
  Problem: Education: Goal: Knowledge of General Education information will improve Description: Including pain rating scale, medication(s)/side effects and non-pharmacologic comfort measures Outcome: Progressing   Problem: Clinical Measurements: Goal: Cardiovascular complication will be avoided Outcome: Progressing   Problem: Coping: Goal: Level of anxiety will decrease Outcome: Progressing   Problem: Pain Managment: Goal: General experience of comfort will improve and/or be controlled Outcome: Progressing   Problem: Safety: Goal: Ability to remain free from injury will improve Outcome: Progressing   Problem: Skin Integrity: Goal: Risk for impaired skin integrity will decrease Outcome: Progressing   Problem: Activity: Goal: Ability to tolerate increased activity will improve Outcome: Progressing

## 2023-11-04 NOTE — Progress Notes (Addendum)
 4 Days Post-Op  Subjective: CC: Her mother is at bedside.   Generalized abdomianl pain that is greatest around her incision is improved from yesterday and well controlled w/ PCA. No nausea. NGT output bilious, 1.1L/24 hours. No flatus or bm. Foley replaced yesterday, good uop. Oob to chair yesterday. She reports some "gurgling" in her chest. No productive cough, cp or sob. She denies any tobacco use.   Afebrile. Tachycardic in the 100's. BP 109/72 on last check. Off pressors. WBC 14.1 (12.8). Hgb stable at 9. K 4.1.    Objective: Vital signs in last 24 hours: Temp:  [98 F (36.7 C)-98.2 F (36.8 C)] 98.1 F (36.7 C) (03/21 0351) Pulse Rate:  [100-116] 109 (03/21 0700) Resp:  [12-20] 13 (03/21 0700) BP: (83-161)/(51-104) 109/72 (03/21 0700) SpO2:  [91 %-99 %] 92 % (03/21 0700) FiO2 (%):  [0 %] 0 % (03/20 1431) Weight:  [51.7 kg] 51.7 kg (03/21 0540) Last BM Date :  (PTA)  Intake/Output from previous day: 03/20 0701 - 03/21 0700 In: 2140.4 [I.V.:1597.6; IV Piggyback:542.8] Out: 2375 [Urine:1275; Emesis/NG output:1100] Intake/Output this shift: Total I/O In: 174.7 [I.V.:74.7; IV Piggyback:100] Out: -   PE: Gen:  Alert, NAD, pleasant Card: Reg Lungs: Rhonchi b/l. No wheezing or obvious rales. Normal rate and effort.  Abd: Soft, mild distension, generalized ttp greatest around her incision, RLQ and left hemiabdomen without rigidity or guarding. More active but still hypoactive BS. NGT bilious. Midline wound clean and stable from picture 3/19  Lab Results:  Recent Labs    11/03/23 0451 11/04/23 0356  WBC 12.8* 14.1*  HGB 9.6* 9.0*  HCT 31.6* 29.2*  PLT 249 280   BMET Recent Labs    11/03/23 0756 11/04/23 0356  NA 139 142  K 4.0 4.1  CL 112* 115*  CO2 23 23  GLUCOSE 121* 123*  BUN 18 19  CREATININE 0.38* 0.36*  CALCIUM 7.6* 7.7*   PT/INR No results for input(s): "LABPROT", "INR" in the last 72 hours. CMP     Component Value Date/Time   NA 142  11/04/2023 0356   NA 137 01/15/2019 0000   K 4.1 11/04/2023 0356   CL 115 (H) 11/04/2023 0356   CO2 23 11/04/2023 0356   GLUCOSE 123 (H) 11/04/2023 0356   BUN 19 11/04/2023 0356   BUN 15 01/15/2019 0000   CREATININE 0.36 (L) 11/04/2023 0356   CALCIUM 7.7 (L) 11/04/2023 0356   PROT 4.6 (L) 11/03/2023 0756   ALBUMIN 1.6 (L) 11/03/2023 0756   AST 14 (L) 11/03/2023 0756   ALT 14 11/03/2023 0756   ALKPHOS 52 11/03/2023 0756   BILITOT 0.6 11/03/2023 0756   GFRNONAA >60 11/04/2023 0356   GFRAA >60 05/20/2020 0502   Lipase     Component Value Date/Time   LIPASE 21 04/29/2020 0315    Studies/Results: No results found.  Anti-infectives: Anti-infectives (From admission, onward)    Start     Dose/Rate Route Frequency Ordered Stop   10/31/23 1100  micafungin (MYCAMINE) 100 mg in sodium chloride 0.9 % 100 mL IVPB        100 mg 105 mL/hr over 1 Hours Intravenous Daily 10/31/23 1031     10/29/23 1800  piperacillin-tazobactam (ZOSYN) IVPB 3.375 g        3.375 g 12.5 mL/hr over 240 Minutes Intravenous Every 8 hours 10/29/23 0918     10/29/23 0930  piperacillin-tazobactam (ZOSYN) IVPB 3.375 g        3.375 g 100  mL/hr over 30 Minutes Intravenous STAT 10/29/23 0918 10/29/23 1005   10/29/23 0615  ceFEPIme (MAXIPIME) 2 g in sodium chloride 0.9 % 100 mL IVPB        2 g 200 mL/hr over 30 Minutes Intravenous  Once 10/29/23 0610 10/29/23 0703   10/29/23 0615  metroNIDAZOLE (FLAGYL) IVPB 500 mg        500 mg 100 mL/hr over 60 Minutes Intravenous  Once 10/29/23 0610 10/29/23 0825        Assessment/Plan POD 6/4, ex lap with extended right colectomy for perforated transverse colon, Dr. Fredricka Bonine, 3/15, ex lap with ileocolonic anastomosis and cholecystectomy Dr. Andrey Campanile 3/17  - Path w/ diverticulitis and chronic cholecystitis/focal adenomyomatous change - Significant contamination.  Cont abx therapy for likely 5-7 days post op and monitor for post op infection. - Cont NGT, expect post op ileus.  Cont TPN - BID WD dressing changes to midline wound - Cont PCA. IV tylenol and robaxin - Keep K >=4, Phos >= 3, Mg >= 2 and mobilize for bowel function - PT to help start mobilizing - Pulm toilet/IS - Appreciate TRH assistance in her care   FEN - NPO, NGT to LIWS, TPN, IVF per primary  VTE - SCDs, Lovenox ID - Zosyn/Micafungin. Afebrile. WBC 14.1. Will get CXR today Foley - out 3/18. Replaced 3/20 for retention.   Staph Capitis +Bcx   LOS: 6 days    Jacinto Halim, [facility] 11/04/2023, 8:25 AM Please see Amion for pager number during day hours 7:00am-4:30pm

## 2023-11-05 DIAGNOSIS — K631 Perforation of intestine (nontraumatic): Secondary | ICD-10-CM | POA: Diagnosis not present

## 2023-11-05 LAB — BASIC METABOLIC PANEL
Anion gap: 6 (ref 5–15)
BUN: 20 mg/dL (ref 6–20)
CO2: 24 mmol/L (ref 22–32)
Calcium: 7.9 mg/dL — ABNORMAL LOW (ref 8.9–10.3)
Chloride: 116 mmol/L — ABNORMAL HIGH (ref 98–111)
Creatinine, Ser: 0.35 mg/dL — ABNORMAL LOW (ref 0.44–1.00)
GFR, Estimated: >60 mL/min (ref >60)
Glucose, Bld: 124 mg/dL — ABNORMAL HIGH (ref 70–99)
Potassium: 3.9 mmol/L (ref 3.5–5.1)
Sodium: 146 mmol/L — ABNORMAL HIGH (ref 135–145)

## 2023-11-05 LAB — GLUCOSE, CAPILLARY
Glucose-Capillary: 116 mg/dL — ABNORMAL HIGH (ref 70–99)
Glucose-Capillary: 117 mg/dL — ABNORMAL HIGH (ref 70–99)
Glucose-Capillary: 129 mg/dL — ABNORMAL HIGH (ref 70–99)
Glucose-Capillary: 133 mg/dL — ABNORMAL HIGH (ref 70–99)

## 2023-11-05 LAB — CBC
HCT: 27.6 % — ABNORMAL LOW (ref 36.0–46.0)
Hemoglobin: 8.4 g/dL — ABNORMAL LOW (ref 12.0–15.0)
MCH: 28.5 pg (ref 26.0–34.0)
MCHC: 30.4 g/dL (ref 30.0–36.0)
MCV: 93.6 fL (ref 80.0–100.0)
Platelets: 344 10*3/uL (ref 150–400)
RBC: 2.95 MIL/uL — ABNORMAL LOW (ref 3.87–5.11)
RDW: 15.1 % (ref 11.5–15.5)
WBC: 16.2 10*3/uL — ABNORMAL HIGH (ref 4.0–10.5)
nRBC: 0 % (ref 0.0–0.2)

## 2023-11-05 LAB — MAGNESIUM: Magnesium: 1.9 mg/dL (ref 1.7–2.4)

## 2023-11-05 MED ORDER — TRAVASOL 10 % IV SOLN
INTRAVENOUS | Status: AC
  Filled 2023-11-05: qty 924

## 2023-11-05 NOTE — Progress Notes (Signed)
  Progress Note   Patient: Stacy [name] ZOX:[id number] DOB: 06-28-1981 DOA: 10/29/2023     7 DOS: the patient was seen and examined on 11/05/2023 at 9:45 AM      Brief hospital course: 43 y.o. F with hx chronic pseudoobstruction, underweight, iron deficiency anemia and orthostatic hypotension who presented with severe abdominal pain, CT showed perforated colon.     Assessment and Plan: * Colon perforation [facility]) S/p ex lap and RIGHT colectomy left in discontinuity by Dr. Fredricka Bonine on 3/15 S/p ex lap with primary ileocolonic anastomosis and cholecystectomy by Dr. Andrey Campanile 3/17  Post-operative Ileus Still no flatus or bowel movement.  White count rising, sinus tachycardia to the 120s.  However her pain is improving, and she is ambulating more. - TPN, analgesics, NG per surgery - Dressings per surgery - Continue micafungin and Zosyn - Discussed intra-abdominal infection with general surgery, no overt signs at this time    Sinus tachycardia Overall still consistent with her history of chronic orthostasis, weight loss, and major surgery/ileus.  Chronic orthostatic hypotension On midodrine and Florinef as an outpatient - Resume Florinef and midodrine when able to take PO   Positive blood culture CoNS in 1/2, likely contaminant, no further work up.  Malnutrition of moderate degree Ileus - Continue TPN - Keep K>4, mag>2, phos>3 - Continue coverage SS insulin - Can resume home Megace at discharge  Normocytic anemia Iron deficiency anemia B12 deficiency ruled out.  Given IV iron 3/21.  Hemoglobin trending down  Hypernatremia Mild - Hold IV fluids for now         Subjective: Patient's pain is improving.  She is ambulating.  She has had no flatus or bowel movement.  She has had no fever, respiratory symptoms, no confusion.     Physical Exam: BP 109/68 (BP Location: Left Arm)   Pulse (!) 118   Temp 98.1 F (36.7 C) (Oral)   Resp 16   Ht 5\' 3"  (1.6 m)   Wt 51.8  kg   SpO2 95%   BMI 20.23 kg/m   Thin adult female, sitting up in recliner, interactive and pleasant NG tube in place Tachycardic, regular, no murmurs, anasarca has improved Respiratory rate normal, lungs clear without rales or wheezes Abdomen exam deferred Attention normal, affect pleasant, judgment and insight appear normal, face symmetric, speech fluent    Data Reviewed: Discussed with general surgery Basic metabolic panel shows stable renal function, mild hyponatremia, potassium, magnesium near normal CBC shows rising white count, dropping hemoglobin  Family Communication: Husband at the bedside    Disposition: Status is: Inpatient         Author: Alberteen Sam, MD 11/05/2023 10:11 AM  For on call review [email or web address].

## 2023-11-05 NOTE — Plan of Care (Signed)
  Problem: Education: Goal: Knowledge of General Education information will improve Description Including pain rating scale, medication(s)/side effects and non-pharmacologic comfort measures Outcome: Progressing   Problem: Clinical Measurements: Goal: Respiratory complications will improve Outcome: Completed/Met

## 2023-11-05 NOTE — Plan of Care (Signed)
  Problem: Education: Goal: Knowledge of General Education information will improve Description: Including pain rating scale, medication(s)/side effects and non-pharmacologic comfort measures Outcome: Progressing   Problem: Clinical Measurements: Goal: Ability to maintain clinical measurements within normal limits will improve Outcome: Progressing Goal: Will remain free from infection Outcome: Progressing Goal: Cardiovascular complication will be avoided Outcome: Progressing   Problem: Activity: Goal: Risk for activity intolerance will decrease Outcome: Progressing   Problem: Nutrition: Goal: Adequate nutrition will be maintained Outcome: Not Progressing   Problem: Elimination: Goal: Will not experience complications related to bowel motility Outcome: Not Progressing   Problem: Pain Managment: Goal: General experience of comfort will improve and/or be controlled Outcome: Progressing   Problem: Safety: Goal: Ability to remain free from injury will improve Outcome: Progressing   Problem: Skin Integrity: Goal: Risk for impaired skin integrity will decrease Outcome: Progressing

## 2023-11-05 NOTE — Progress Notes (Signed)
 PHARMACY - TOTAL PARENTERAL NUTRITION CONSULT NOTE   Indication: Prolonged ileus  Patient Measurements: Height: 5\' 3"  (160 cm) Weight: 51.8 kg (114 lb 3.2 oz) IBW/kg (Calculated) : 52.4 TPN AdjBW (KG): 51.7 Body mass index is 20.23 kg/m.  Assessment: 57 yoF admitted on 3/15 with abdominal pain, found to have pneumoperitoneum, colon perforation, mesenteric mass, diffuse/severe peritonitis, and distended gallbladder.  She is s/p OR on 3/15 and 3/17.  Pharmacy is consulted to dose TPN.    Glucose / Insulin: No hx DM -Hypoglycemic requiring D10 infusion on 3/16 prior to TPN -BG goal <180. Range 112-129 (no SSI required) Electrolytes: Na 146, Cl 116, K 3.9, Mag 1.9.  Other WNL. -Goal: K >/= 4, Mg >/= 2, Phos >/= 3 Renal: SCr < 1, BUN WNL  Hepatic: AST/ALT, Tbili - WNL, Albumin 1.6 low  -TG now WNL. Suspect previously high levels related to propofol   Intake / Output; MIVF: No mIVF -UOP: 2300 mL, NG output: 2650 mL GI Imaging: GI Surgeries / Procedures:  - 3/15 right colectomy for perforated transverse colon with proximal mesenteric mass   - 3/17 Ileocolonic anastomosis, cholecystectomy, and abdominal closure   Central access: PICC 3/17 TPN start date: 3/18   Nutritional Goals: Goal TPN rate is 70 mL/hr (provides 92 g of protein and 1730 kcals per day)  RD Assessment: Estimated Needs Total Energy Estimated Needs: 1600-1750 kcals Total Protein Estimated Needs: 85-100 grams Total Fluid Estimated Needs: >/= 1.6L  Current Nutrition:  NPO, TPN  Plan:  At 1800: Continue TPN at goal rate of 70 mL/hr Electrolytes in TPN: Na 25 mEq/L, K 70 mEq/L, Ca 5 mEq/L, Mg 10 mEq/L, and Phos 15 mmol/L.  Cl:Ac max Ac Add standard MVI and trace elements to TPN Continue CBG + sensitive SSI q8h and adjust as needed  mIVF management per MD Monitor TPN labs on Mon/Thurs and prn. Bmet and Mag with AM labs.  Thiamine 100mg  IV x5 days through 3/22   [facility] PharmD, BCPS [facility] main pharmacy  [phone] 11/05/2023 7:03 AM

## 2023-11-05 NOTE — Progress Notes (Signed)
 Patient ID: Stacy [name], female   DOB: 04-26-1981, 43 y.o.   MRN: [id number]   Acute Care Surgery Service Progress Note:    Chief Complaint/Subjective: Pt walked yesterday No flatus/bm NG >2L bilious Mom at [facility]  Objective: Vital signs in last 24 hours: Temp:  [97.7 F (36.5 C)-98.3 F (36.8 C)] 98.1 F (36.7 C) (03/22 0400) Pulse Rate:  [101-121] 118 (03/22 0800) Resp:  [11-23] 16 (03/22 0800) BP: (82-119)/(45-83) 109/68 (03/22 0400) SpO2:  [90 %-96 %] 95 % (03/22 0800) Weight:  [51.8 kg] 51.8 kg (03/22 0500) Last BM Date :  (PTA)  Intake/Output from previous day: 03/21 0701 - 03/22 0700 In: 2451.5 [I.V.:1625.7; IV Piggyback:825.7] Out: 4955 [Urine:2305; Emesis/NG output:2650] Intake/Output this shift: No intake/output data recorded.  Lungs: nonlabored  Cardiovascular: reg  Abd: soft, flat, mild approp TTP  Extremities: no edema, +SCDs  Neuro: alert, nonfocal  Lab Results: CBC  Recent Labs    11/04/23 0356 11/05/23 0328  WBC 14.1* 16.2*  HGB 9.0* 8.4*  HCT 29.2* 27.6*  PLT 280 344   BMET Recent Labs    11/04/23 0356 11/05/23 0328  NA 142 146*  K 4.1 3.9  CL 115* 116*  CO2 23 24  GLUCOSE 123* 124*  BUN 19 20  CREATININE 0.36* 0.35*  CALCIUM 7.7* 7.9*   LFT    Latest Ref Rng & Units 11/03/2023    7:56 AM 11/02/2023    6:30 AM 11/01/2023    5:50 AM  Hepatic Function  Total Protein 6.5 - 8.1 g/dL 4.6  4.6  4.4   Albumin 3.5 - 5.0 g/dL 1.6  1.7  1.8   AST 15 - 41 U/L 14  16  18    ALT 0 - 44 U/L 14  17  20    Alk Phosphatase 38 - 126 U/L 52  56  56   Total Bilirubin 0.0 - 1.2 mg/dL 0.6  1.0  1.3    PT/INR No results for input(s): "LABPROT", "INR" in the last 72 hours. ABG No results for input(s): "PHART", "HCO3" in the last 72 hours.  Invalid input(s): "PCO2", "PO2"  Studies/Results:  Anti-infectives: Anti-infectives (From admission, onward)    Start     Dose/Rate Route Frequency Ordered Stop   10/31/23 1100  micafungin (MYCAMINE)  100 mg in sodium chloride 0.9 % 100 mL IVPB        100 mg 105 mL/hr over 1 Hours Intravenous Daily 10/31/23 1031     10/29/23 1800  piperacillin-tazobactam (ZOSYN) IVPB 3.375 g        3.375 g 12.5 mL/hr over 240 Minutes Intravenous Every 8 hours 10/29/23 0918     10/29/23 0930  piperacillin-tazobactam (ZOSYN) IVPB 3.375 g        3.375 g 100 mL/hr over 30 Minutes Intravenous STAT 10/29/23 0918 10/29/23 1005   10/29/23 0615  ceFEPIme (MAXIPIME) 2 g in sodium chloride 0.9 % 100 mL IVPB        2 g 200 mL/hr over 30 Minutes Intravenous  Once 10/29/23 0610 10/29/23 0703   10/29/23 0615  metroNIDAZOLE (FLAGYL) IVPB 500 mg        500 mg 100 mL/hr over 60 Minutes Intravenous  Once 10/29/23 0610 10/29/23 0825       Medications: Scheduled Meds:  Chlorhexidine Gluconate Cloth  6 each Topical Daily   enoxaparin (LOVENOX) injection  40 mg Subcutaneous Q24H   insulin aspart  0-6 Units Subcutaneous Q8H   methocarbamol (ROBAXIN) injection  1,000 mg  Intravenous Q8H   morphine   Intravenous Q4H   pantoprazole (PROTONIX) IV  40 mg Intravenous Daily   sodium chloride flush  10-40 mL Intracatheter Q12H   thiamine (VITAMIN B1) injection  100 mg Intravenous Daily   Continuous Infusions:  micafungin (MYCAMINE) 100 mg in sodium chloride 0.9 % 100 mL IVPB Stopped (11/04/23 1212)   piperacillin-tazobactam (ZOSYN)  IV Stopped (11/05/23 0546)   TPN ADULT (ION) 70 mL/hr at 11/05/23 0450   TPN ADULT (ION)     PRN Meds:.diphenhydrAMINE **OR** diphenhydrAMINE, naloxone **AND** sodium chloride flush, ondansetron (ZOFRAN) IV, mouth rinse, phenol, sodium chloride flush  Assessment/Plan: Patient Active Problem List   Diagnosis Date Noted   Positive blood culture 11/03/2023   Ileus (HCC) 11/03/2023   Malnutrition of moderate degree 11/01/2023   Colon perforation (HCC) 10/29/2023   Irregular bleeding 03/24/2023   Adie's tonic pupil, 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   Anxiety 09/25/2020   Chronic idiopathic constipation  09/25/2020   Weight loss 09/25/2020   Adrenal insufficiency (HCC) 09/16/2020   Amenorrhea 07/21/2020   Chronic orthostatic hypotension    Hypophosphatemia    Hypomagnesemia    Bloating    Nausea and vomiting 04/28/2020   Failure to thrive in adult 04/27/2020   Physical exam 01/01/2020   Generalized abdominal pain 04/25/2019   Hyponatremia 04/25/2019   Hypokalemia 04/25/2019   Severe protein-calorie malnutrition (HCC) 04/25/2019   Gastroesophageal reflux disease 04/07/2019   Normocytic anemia 04/07/2019   Chronic intestinal pseudo-obstruction 09/05/2018   Iron deficiency anemia 01/22/2018   Vitamin B deficiency 01/22/2018   Vitamin D deficiency 01/22/2018   Underweight 06/27/2017   Cyst of ovary 10/05/2016   Hepatic adenoma 07/27/2013   POD 7/5, ex lap with extended right colectomy for perforated transverse colon, Dr. Fredricka Bonine, 3/15, ex lap with ileocolonic anastomosis and cholecystectomy Dr. Andrey Campanile 3/17  - Path w/ diverticulitis and chronic cholecystitis/focal adenomyomatous change - Significant contamination.  Cont abx therapy for likely 5-7 days post op and monitor for post op infection. - Cont NGT, expect post op ileus. Cont TPN - BID WD dressing changes to midline wound - Cont PCA. IV tylenol and robaxin - Keep K >=4, Phos >= 3, Mg >= 2 and mobilize for bowel function - PT to help start mobilizing - Pulm toilet/IS - Appreciate TRH assistance in her care   FEN - NPO, NGT to LIWS, TPN, IVF per primary  VTE - SCDs, Lovenox ID - Zosyn/Micafungin. Afebrile. WBC 14.1-->16.2. CXR yesterday - atelectasis Foley - out 3/18. Replaced 3/20 for retention. Per TRH   Staph Capitis +Bcx  Disposition: Discussed with patient and mom the importance of her getting up and moving around and spending more time in the chair and walking the halls, no fever however rising white blood cell count.  Patient will likely be getting a CT scan of her abdomen pelvis in the next day or so to evaluate for  potential intra-abdominal fluid collection since she had significant contamination.  Right now I have no suspicion of an anastomotic leak based on her physical exam and no fever.  okay to transfer to floor  LOS: 7 days    Stacy [name]. Andrey Campanile, MD, FACS General, Bariatric, & Minimally Invasive Surgery [phone] [place] [facility]

## 2023-11-06 ENCOUNTER — Inpatient Hospital Stay (HOSPITAL_COMMUNITY)

## 2023-11-06 DIAGNOSIS — K631 Perforation of intestine (nontraumatic): Secondary | ICD-10-CM | POA: Diagnosis not present

## 2023-11-06 LAB — GLUCOSE, CAPILLARY
Glucose-Capillary: 117 mg/dL — ABNORMAL HIGH (ref 70–99)
Glucose-Capillary: 121 mg/dL — ABNORMAL HIGH (ref 70–99)
Glucose-Capillary: 131 mg/dL — ABNORMAL HIGH (ref 70–99)
Glucose-Capillary: 134 mg/dL — ABNORMAL HIGH (ref 70–99)

## 2023-11-06 LAB — BASIC METABOLIC PANEL
Anion gap: 8 (ref 5–15)
BUN: 21 mg/dL — ABNORMAL HIGH (ref 6–20)
CO2: 22 mmol/L (ref 22–32)
Calcium: 8 mg/dL — ABNORMAL LOW (ref 8.9–10.3)
Chloride: 116 mmol/L — ABNORMAL HIGH (ref 98–111)
Creatinine, Ser: 0.39 mg/dL — ABNORMAL LOW (ref 0.44–1.00)
GFR, Estimated: >60 mL/min (ref >60)
Glucose, Bld: 132 mg/dL — ABNORMAL HIGH (ref 70–99)
Potassium: 4.3 mmol/L (ref 3.5–5.1)
Sodium: 146 mmol/L — ABNORMAL HIGH (ref 135–145)

## 2023-11-06 LAB — MAGNESIUM: Magnesium: 1.9 mg/dL (ref 1.7–2.4)

## 2023-11-06 LAB — CBC
HCT: 27.1 % — ABNORMAL LOW (ref 36.0–46.0)
Hemoglobin: 8.2 g/dL — ABNORMAL LOW (ref 12.0–15.0)
MCH: 28.2 pg (ref 26.0–34.0)
MCHC: 30.3 g/dL (ref 30.0–36.0)
MCV: 93.1 fL (ref 80.0–100.0)
Platelets: 447 10*3/uL — ABNORMAL HIGH (ref 150–400)
RBC: 2.91 MIL/uL — ABNORMAL LOW (ref 3.87–5.11)
RDW: 15.1 % (ref 11.5–15.5)
WBC: 15.4 10*3/uL — ABNORMAL HIGH (ref 4.0–10.5)
nRBC: 0 % (ref 0.0–0.2)

## 2023-11-06 LAB — PHOSPHORUS: Phosphorus: 3.8 mg/dL (ref 2.5–4.6)

## 2023-11-06 MED ORDER — MAGNESIUM SULFATE 2 GM/50ML IV SOLN
2.0000 g | Freq: Once | INTRAVENOUS | Status: AC
  Administered 2023-11-06: 2 g via INTRAVENOUS
  Filled 2023-11-06: qty 50

## 2023-11-06 MED ORDER — POTASSIUM PHOSPHATES 15 MMOLE/5ML IV SOLN
30.0000 mmol | Freq: Once | INTRAVENOUS | Status: DC
  Filled 2023-11-06: qty 10

## 2023-11-06 MED ORDER — IOHEXOL 300 MG/ML  SOLN
100.0000 mL | Freq: Once | INTRAMUSCULAR | Status: AC | PRN
  Administered 2023-11-06: 80 mL via INTRAVENOUS

## 2023-11-06 MED ORDER — TRAVASOL 10 % IV SOLN
INTRAVENOUS | Status: AC
  Filled 2023-11-06: qty 924

## 2023-11-06 MED ORDER — IOHEXOL 9 MG/ML PO SOLN
1000.0000 mL | Freq: Once | ORAL | Status: AC
  Administered 2023-11-06: 1000 mL via ORAL

## 2023-11-06 NOTE — Plan of Care (Signed)
  Problem: Education: Goal: Knowledge of General Education information will improve Description: Including pain rating scale, medication(s)/side effects and non-pharmacologic comfort measures Outcome: Progressing   Problem: Health Behavior/Discharge Planning: Goal: Ability to manage health-related needs will improve Outcome: Progressing   Problem: Clinical Measurements: Goal: Ability to maintain clinical measurements within normal limits will improve Outcome: Progressing Goal: Will remain free from infection Outcome: Progressing Goal: Diagnostic test results will improve Outcome: Progressing Goal: Cardiovascular complication will be avoided Outcome: Progressing   Problem: Activity: Goal: Risk for activity intolerance will decrease Outcome: Progressing   Problem: Nutrition: Goal: Adequate nutrition will be maintained Outcome: Progressing   Problem: Coping: Goal: Level of anxiety will decrease Outcome: Progressing   Problem: Elimination: Goal: Will not experience complications related to bowel motility Outcome: Progressing Goal: Will not experience complications related to urinary retention Outcome: Progressing   Problem: Pain Managment: Goal: General experience of comfort will improve and/or be controlled Outcome: Progressing   Problem: Safety: Goal: Ability to remain free from injury will improve Outcome: Progressing   Problem: Skin Integrity: Goal: Risk for impaired skin integrity will decrease Outcome: Progressing   Problem: Activity: Goal: Ability to tolerate increased activity will improve Outcome: Progressing   Problem: Respiratory: Goal: Ability to maintain a clear airway and adequate ventilation will improve Outcome: Progressing   Problem: Respiratory: Goal: Ability to maintain a clear airway and adequate ventilation will improve Outcome: Progressing   Problem: Role Relationship: Goal: Method of communication will improve Outcome: Progressing

## 2023-11-06 NOTE — Progress Notes (Signed)
 Mobility Specialist - Progress Note   11/06/23 1333  Mobility  Activity Ambulated with assistance in hallway  Level of Assistance Standby assist, set-up cues, supervision of patient - no hands on  Assistive Device Front wheel walker  Distance Ambulated (ft) 275 ft  Activity Response Tolerated well  Mobility Referral Yes  Mobility visit 1 Mobility  Mobility Specialist Start Time (ACUTE ONLY) 1321  Mobility Specialist Stop Time (ACUTE ONLY) 1332  Mobility Specialist Time Calculation (min) (ACUTE ONLY) 11 min   Pt received in bed and agreeable to mobility. Pt was minA from supine>sitting & minA getting back into bed. Upon returning to room, pt c/o having a headache. RN made aware. No other complaints during session. Pt to bed after session with all needs met.    [facility]

## 2023-11-06 NOTE — Plan of Care (Signed)
  Problem: Education: Goal: Knowledge of General Education information will improve Description: Including pain rating scale, medication(s)/side effects and non-pharmacologic comfort measures Outcome: Progressing   Problem: Health Behavior/Discharge Planning: Goal: Ability to manage health-related needs will improve Outcome: Progressing   Problem: Clinical Measurements: Goal: Ability to maintain clinical measurements within normal limits will improve Outcome: Progressing Goal: Will remain free from infection Outcome: Progressing Goal: Diagnostic test results will improve Outcome: Progressing Goal: Cardiovascular complication will be avoided Outcome: Progressing   Problem: Activity: Goal: Risk for activity intolerance will decrease Outcome: Progressing   Problem: Nutrition: Goal: Adequate nutrition will be maintained Outcome: Progressing   Problem: Coping: Goal: Level of anxiety will decrease Outcome: Progressing   Problem: Elimination: Goal: Will not experience complications related to bowel motility Outcome: Progressing Goal: Will not experience complications related to urinary retention Outcome: Progressing   Problem: Pain Managment: Goal: General experience of comfort will improve and/or be controlled Outcome: Progressing   Problem: Safety: Goal: Ability to remain free from injury will improve Outcome: Progressing   Problem: Skin Integrity: Goal: Risk for impaired skin integrity will decrease Outcome: Progressing   Problem: Activity: Goal: Ability to tolerate increased activity will improve Outcome: Progressing   Problem: Respiratory: Goal: Ability to maintain a clear airway and adequate ventilation will improve Outcome: Progressing   Problem: Role Relationship: Goal: Method of communication will improve Outcome: Progressing

## 2023-11-06 NOTE — Progress Notes (Signed)
  Progress Note   Patient: Stacy [name] ZOX:[id number] DOB: [birth year] DOA: 10/29/2023     8 DOS: the patient was seen and examined on 11/06/2023 at 9:06 AM      Brief hospital course: 43 y.o. F with hx chronic pseudoobstruction, underweight, iron deficiency anemia and orthostatic hypotension who presented with severe abdominal pain, CT showed perforated colon.     Assessment and Plan: * Colon perforation (HCC) S/p ex lap and RIGHT colectomy left in discontinuity by Dr. Fredricka Bonine on 3/15 S/p ex lap with primary ileocolonic anastomosis and cholecystectomy by Dr. Andrey Campanile 3/17  Post-operative Ileus No changes - CT abdomen and pelvis ordered by surgery - TPN, analgesics, NG, dressings per surgery - Continue micafungin and Zosyn    Malnutrition of moderate degree Ileus - Continue TPN - Keep K>4, mag>2, phos>3 - Continue coverage SS insulin  Chronic orthostatic hypotension Blood pressure stable here  Normocytic anemia Iron deficiency anemia Status post IV iron hemoglobin trending down, no clinical bleeding observed or reported          Subjective: Patient's pain seems it is improving, she has had no flatus or bowel movement.  Surgery have ordered a CT scan, she has had no fever.     Physical Exam: BP (!) 129/95 (BP Location: Left Arm)   Pulse (!) 113   Temp 98.2 F (36.8 C) (Oral)   Resp 16   Ht 5\' 3"  (1.6 m)   Wt 51.8 kg   SpO2 96%   BMI 20.23 kg/m   Thin adult female, lying in bed, interactive and appropriate Tachycardic, no murmurs, mild overall anasarca Respiratory rate normal, lungs diminished but no rales or wheezes appreciated Abdomen tender diffusely, mild voluntary guarding, wound not observed Attention normal, judgment and insight appear normal, oriented to person, place, time, space symmetric, speech fluent   Data Reviewed: Magnesium and potassium slightly down, phosphorus normal White blood cell count stable, hemoglobin trended down to  8.2  Family Communication: None present    Disposition: Status is: Inpatient         Author: Alberteen Sam, MD 11/06/2023 1:22 PM  For on call review [email or web address].

## 2023-11-06 NOTE — Progress Notes (Signed)
 Paged Dr Gaynelle Adu, confirming he wants contrast down the NG.

## 2023-11-06 NOTE — Progress Notes (Signed)
 PHARMACY - TOTAL PARENTERAL NUTRITION CONSULT NOTE   Indication: Prolonged ileus  Patient Measurements: Height: 5\' 3"  (160 cm) Weight: 51.8 kg (114 lb 3.2 oz) IBW/kg (Calculated) : 52.4 TPN AdjBW (KG): 51.7 Body mass index is 20.23 kg/m.  Assessment: 35 yoF admitted on 3/15 with abdominal pain, found to have pneumoperitoneum, colon perforation, mesenteric mass, diffuse/severe peritonitis, and distended gallbladder.  She is s/p OR on 3/15 and 3/17.  Pharmacy is consulted to dose TPN.    Glucose / Insulin: No hx DM - CBGs remain at goal (100-150) despite full-rate TPN - no SSI used since admission Electrolytes: Na, Cl remain slightly elevated; others WNL (Mg slightly below goal for ileus, however) Renal: SCr low/stable; BUN trending up; now slightly elevated; UOP remains adequate per charting (last dose Lasix 3/21) Hepatic: (3/20) AST/ALT, Tbili all WNL, Albumin remains low - (3/20) TG now normalized after being previously elevated to nearly 800 (suspect propofol-related) I/O: no flatus/BM yet - NG output significantly improved yesterday (2250 >> 1350 ml) - no drains or recent diuretics GI Imaging: - 3/15 CT a/p: pneumoperitoneum with multiple foci in ventral abdomen suggesting perforated viscus, diffuse peritonitis GI Surgeries / Procedures:  - 3/15 right colectomy for perforated transverse colon with proximal mesenteric mass   - 3/17 Ileocolonic anastomosis, cholecystectomy, and abdominal closure   Central access: PICC 3/17 TPN start date: 3/18   Nutritional Goals: Goal TPN rate is 70 mL/hr (provides 92 g of protein and 1730 kcals per day) - Per Surgery, keep K >/= 4, Mg >/= 2, Phos >/= 3  RD Assessment: Estimated Needs Total Energy Estimated Needs: 1600-1750 kcals Total Protein Estimated Needs: 85-100 grams Total Fluid Estimated Needs: >/= 1.6L  Current Nutrition:  NPO, TPN  Plan:  Mg 2g IV x 1 per TRH  Continue TPN at goal rate 70 mL/hr at 1800 Electrolytes in TPN:  no changes today - can further decrease Na on Monday if remains elevated Na - 25 mEq/L K - 70 mEq/L Ca - 5 mEq/L Mg - 10 mEq/L Phos - 15 mmol/L Cl:Ac ratio - max Ac Add standard MVI and trace elements to TPN Chromium remains on hold d/t national backorder Will discontinue SSI/CBG checks d/t excellent control w/o SSI MIVF per MD Monitor TPN labs on Mon/Thurs  Bernadene Person, PharmD, BCPS [phone] 11/06/2023, 11:23 AM

## 2023-11-06 NOTE — Progress Notes (Signed)
 Dr Andrey Campanile confirmed to give contrast via NG

## 2023-11-06 NOTE — Progress Notes (Signed)
 Patient ID: Stacy [name], female   DOB: 07-15-1981, 43 y.o.   MRN: [id number]   Acute Care Surgery Service Progress Note:    Chief Complaint/Subjective: Pt walked yesterday several times No flatus/bm [id number] bilious Husband on Facetime   Objective: Vital signs in last 24 hours: Temp:  [98 F (36.7 C)-98.7 F (37.1 C)] 98.6 F (37 C) (03/23 9629) Pulse Rate:  [110-125] 119 (03/23 0611) Resp:  [13-24] 16 (03/23 0611) BP: (87-127)/(51-89) 127/89 (03/23 0611) SpO2:  [93 %-99 %] 93 % (03/23 0611) FiO2 (%):  [0 %] 0 % (03/23 0416) Last BM Date :  (PTA)  Intake/Output from previous day: 03/22 0701 - 03/23 0700 In: 2005.1 [I.V.:1752.2; IV Piggyback:253] Out: 2525 [Urine:725; Emesis/NG output:1800] Intake/Output this shift: Total I/O In: -  Out: 475 [Urine:200; Emesis/NG output:275]  Lungs: nonlabored  Cardiovascular: reg  Abd: soft, flat, mild approp TTP  Extremities: no edema, +SCDs  Neuro: alert, nonfocal  Lab Results: CBC  Recent Labs    11/05/23 0328 11/06/23 0247  WBC 16.2* 15.4*  HGB 8.4* 8.2*  HCT 27.6* 27.1*  PLT 344 447*   BMET Recent Labs    11/05/23 0328 11/06/23 0247  NA 146* 146*  K 3.9 4.3  CL 116* 116*  CO2 24 22  GLUCOSE 124* 132*  BUN 20 21*  CREATININE 0.35* 0.39*  CALCIUM 7.9* 8.0*   LFT    Latest Ref Rng & Units 11/03/2023    7:56 AM 11/02/2023    6:30 AM 11/01/2023    5:50 AM  Hepatic Function  Total Protein 6.5 - 8.1 g/dL 4.6  4.6  4.4   Albumin 3.5 - 5.0 g/dL 1.6  1.7  1.8   AST 15 - 41 U/L 14  16  18    ALT 0 - 44 U/L 14  17  20    Alk Phosphatase 38 - 126 U/L 52  56  56   Total Bilirubin 0.0 - 1.2 mg/dL 0.6  1.0  1.3    PT/INR No results for input(s): "LABPROT", "INR" in the last 72 hours. ABG No results for input(s): "PHART", "HCO3" in the last 72 hours.  Invalid input(s): "PCO2", "PO2"  Studies/Results:  Anti-infectives: Anti-infectives (From admission, onward)    Start     Dose/Rate Route Frequency  Ordered Stop   10/31/23 1100  micafungin (MYCAMINE) 100 mg in sodium chloride 0.9 % 100 mL IVPB        100 mg 105 mL/hr over 1 Hours Intravenous Daily 10/31/23 1031     10/29/23 1800  piperacillin-tazobactam (ZOSYN) IVPB 3.375 g        3.375 g 12.5 mL/hr over 240 Minutes Intravenous Every 8 hours 10/29/23 0918     10/29/23 0930  piperacillin-tazobactam (ZOSYN) IVPB 3.375 g        3.375 g 100 mL/hr over 30 Minutes Intravenous STAT 10/29/23 0918 10/29/23 1005   10/29/23 0615  ceFEPIme (MAXIPIME) 2 g in sodium chloride 0.9 % 100 mL IVPB        2 g 200 mL/hr over 30 Minutes Intravenous  Once 10/29/23 0610 10/29/23 0703   10/29/23 0615  metroNIDAZOLE (FLAGYL) IVPB 500 mg        500 mg 100 mL/hr over 60 Minutes Intravenous  Once 10/29/23 0610 10/29/23 0825       Medications: Scheduled Meds:  Chlorhexidine Gluconate Cloth  6 each Topical Daily   enoxaparin (LOVENOX) injection  40 mg Subcutaneous Q24H   insulin aspart  0-6 Units Subcutaneous  Q8H   methocarbamol (ROBAXIN) injection  1,000 mg Intravenous Q8H   morphine   Intravenous Q4H   pantoprazole (PROTONIX) IV  40 mg Intravenous Daily   sodium chloride flush  10-40 mL Intracatheter Q12H   Continuous Infusions:  magnesium sulfate bolus IVPB     micafungin (MYCAMINE) 100 mg in sodium chloride 0.9 % 100 mL IVPB Stopped (11/05/23 1126)   piperacillin-tazobactam (ZOSYN)  IV 12.5 mL/hr at 11/06/23 0530   TPN ADULT (ION) 70 mL/hr at 11/06/23 0530   PRN Meds:.diphenhydrAMINE **OR** diphenhydrAMINE, naloxone **AND** sodium chloride flush, ondansetron (ZOFRAN) IV, mouth rinse, phenol, sodium chloride flush  Assessment/Plan: Patient Active Problem List   Diagnosis Date Noted   Positive blood culture 11/03/2023   Ileus (HCC) 11/03/2023   Malnutrition of moderate degree 11/01/2023   Colon perforation (HCC) 10/29/2023   Irregular bleeding 03/24/2023   Adie's tonic pupil, 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   Anxiety 09/25/2020   Chronic idiopathic  constipation 09/25/2020   Weight loss 09/25/2020   Adrenal insufficiency (HCC) 09/16/2020   Amenorrhea 07/21/2020   Chronic orthostatic hypotension    Hypophosphatemia    Hypomagnesemia    Bloating    Nausea and vomiting 04/28/2020   Failure to thrive in adult 04/27/2020   Physical exam 01/01/2020   Generalized abdominal pain 04/25/2019   Hyponatremia 04/25/2019   Hypokalemia 04/25/2019   Severe protein-calorie malnutrition (HCC) 04/25/2019   Gastroesophageal reflux disease 04/07/2019   Normocytic anemia 04/07/2019   Chronic intestinal pseudo-obstruction 09/05/2018   Iron deficiency anemia 01/22/2018   Vitamin B deficiency 01/22/2018   Vitamin D deficiency 01/22/2018   Underweight 06/27/2017   Cyst of ovary 10/05/2016   Hepatic adenoma 07/27/2013   POD 8/6, ex lap with extended right colectomy for perforated transverse colon, Dr. Fredricka Bonine, 3/15, ex lap with ileocolonic anastomosis and cholecystectomy Dr. Andrey Campanile 3/17  - Path w/ diverticulitis and chronic cholecystitis/focal adenomyomatous change - Significant contamination.  Cont abx therapy for likely 5-7 days post op and monitor for post op infection. - Cont NGT, expect post op ileus. Cont TPN - BID WD dressing changes to midline wound - Cont PCA. IV tylenol and robaxin - Keep K >=4, Phos >= 3, Mg >= 2 and mobilize for bowel function - PT to help start mobilizing - Pulm toilet/IS - Appreciate TRH assistance in her care   FEN - NPO, NGT to LIWS, TPN, IVF per primary  VTE - SCDs, Lovenox ID - Zosyn/Micafungin. Afebrile. WBC 14.1-->16.2-->15. CXR yesterday - atelectasis Foley - out 3/18. Replaced 3/20 for retention. Per TRH   Staph Capitis +Bcx  Disposition:  check CT a/p today to evaluate postop fluid collection, prolonged ileus (sort of expected); if CT negative for abscess - stop zosyn    LOS: 8 days    [name]. Andrey Campanile, MD, FACS General, Bariatric, & Minimally Invasive Surgery [phone] [facility]  Surgery, [facility]

## 2023-11-06 NOTE — Progress Notes (Signed)
 Per Crystal in pharmacy, zosyn and magnesium are compatible

## 2023-11-06 NOTE — Progress Notes (Signed)
 Notified CT first bottle of contrast done.  To start second bottle in 1 hour as directed

## 2023-11-07 DIAGNOSIS — K631 Perforation of intestine (nontraumatic): Secondary | ICD-10-CM | POA: Diagnosis not present

## 2023-11-07 LAB — COMPREHENSIVE METABOLIC PANEL
ALT: 16 U/L (ref 0–44)
AST: 16 U/L (ref 15–41)
Albumin: 1.8 g/dL — ABNORMAL LOW (ref 3.5–5.0)
Alkaline Phosphatase: 69 U/L (ref 38–126)
Anion gap: 9 (ref 5–15)
BUN: 20 mg/dL (ref 6–20)
CO2: 24 mmol/L (ref 22–32)
Calcium: 8.1 mg/dL — ABNORMAL LOW (ref 8.9–10.3)
Chloride: 107 mmol/L (ref 98–111)
Creatinine, Ser: <0.3 mg/dL — ABNORMAL LOW (ref 0.44–1.00)
Glucose, Bld: 126 mg/dL — ABNORMAL HIGH (ref 70–99)
Potassium: 4.4 mmol/L (ref 3.5–5.1)
Sodium: 140 mmol/L (ref 135–145)
Total Bilirubin: 0.2 mg/dL (ref 0.0–1.2)
Total Protein: 5.5 g/dL — ABNORMAL LOW (ref 6.5–8.1)

## 2023-11-07 LAB — MAGNESIUM: Magnesium: 2.1 mg/dL (ref 1.7–2.4)

## 2023-11-07 LAB — PHOSPHORUS: Phosphorus: 3.8 mg/dL (ref 2.5–4.6)

## 2023-11-07 LAB — CBC
HCT: 25.6 % — ABNORMAL LOW (ref 36.0–46.0)
Hemoglobin: 7.8 g/dL — ABNORMAL LOW (ref 12.0–15.0)
MCH: 28.3 pg (ref 26.0–34.0)
MCHC: 30.5 g/dL (ref 30.0–36.0)
MCV: 92.8 fL (ref 80.0–100.0)
Platelets: 574 10*3/uL — ABNORMAL HIGH (ref 150–400)
RBC: 2.76 MIL/uL — ABNORMAL LOW (ref 3.87–5.11)
RDW: 14.8 % (ref 11.5–15.5)
WBC: 14.5 10*3/uL — ABNORMAL HIGH (ref 4.0–10.5)
nRBC: 0 % (ref 0.0–0.2)

## 2023-11-07 LAB — GLUCOSE, CAPILLARY
Glucose-Capillary: 122 mg/dL — ABNORMAL HIGH (ref 70–99)
Glucose-Capillary: 125 mg/dL — ABNORMAL HIGH (ref 70–99)

## 2023-11-07 LAB — TRIGLYCERIDES: Triglycerides: 324 mg/dL — ABNORMAL HIGH (ref <150)

## 2023-11-07 MED ORDER — TRAVASOL 10 % IV SOLN
INTRAVENOUS | Status: AC
  Filled 2023-11-07: qty 924

## 2023-11-07 NOTE — TOC Progression Note (Signed)
 Transition of Care [facility]) - Progression Note    Patient Details  Name: Stacy [name] MRN: [id number] Date of Birth: [birth year]  [facility]) CM/SW Contact  Howell Rucks, RN Phone Number: 11/07/2023, 11:02 AM  Clinical Narrative:  Pt currently on TPN. TOC will continue to follow for dc planning.       Barriers to Discharge: Continued Medical Work up  Expected Discharge Plan and Services       Living arrangements for the past 2 months: Single Family Home                   DME Agency: NA                   Social Determinants of Health (SDOH) Interventions SDOH Screenings   Food Insecurity: No Food Insecurity (10/30/2023)  Housing: Low Risk  (10/30/2023)  Transportation Needs: Patient Unable To Answer (10/30/2023)  Utilities: Patient Unable To Answer (10/30/2023)  Alcohol Screen: Low Risk  (06/09/2023)  Depression (PHQ2-9): Low Risk  (05/17/2023)  Financial Resource Strain: Low Risk  (06/09/2023)  Physical Activity: Insufficiently Active (06/09/2023)  Social Connections: Moderately Integrated (06/09/2023)  Stress: No Stress Concern Present (06/09/2023)  Tobacco Use: Low Risk  (10/30/2023)    Readmission Risk Interventions    11/02/2023    2:10 PM 10/30/2023    8:34 AM  Readmission Risk Prevention Plan  Transportation Screening Complete Complete  PCP or Specialist Appt within 5-7 Days Complete Complete  Home Care Screening Complete Complete  Medication Review (RN CM) Complete Complete

## 2023-11-07 NOTE — Progress Notes (Signed)
 PHARMACY - TOTAL PARENTERAL NUTRITION CONSULT NOTE   Indication: Prolonged ileus  Patient Measurements: Height: 5\' 3"  (160 cm) Weight: 51 kg (112 lb 7 oz) IBW/kg (Calculated) : 52.4 TPN AdjBW (KG): 51.7 Body mass index is 19.92 kg/m.  Assessment: 59 yoF admitted on 3/15 with abdominal pain, found to have pneumoperitoneum, colon perforation, mesenteric mass, diffuse/severe peritonitis, and distended gallbladder.  She is s/p OR on 3/15 and 3/17.  Pharmacy is consulted to dose TPN.    Glucose / Insulin: No hx DM - CBGs remain at goal (100-150) on full-rate TPN SSI/CBGs stopped 3/23 Electrolytes: WNL  Renal: SCr low/stable; BUN WNL; UOP remains adequate per charting (last dose Lasix 3/21) Hepatic: (3/24) AST/ALT, Tbili all WNL, Albumin remains low - Tg previously elevated to nearly 800 (suspect propofol-related) then down to 99 on 3/20, now up to 324> plan to recheck Tg on 3/27  I/O: no flatus/BM yet - NG output 1850 ml UOP 1700 ml - no drains or recent diuretics GI Imaging: - 3/15 CT a/p: pneumoperitoneum with multiple foci in ventral abdomen suggesting perforated viscus, diffuse peritonitis 3/23 CT a/p: diffuse, nonspecific infectious or inflammatory Enterocolitis, marked, diffuse peritoneal thickening and hyperenhancement throughout the abdomen and pelvis. Findings are consistent with peritonitis. GI Surgeries / Procedures:  - 3/15 right colectomy for perforated transverse colon with proximal mesenteric mass   - 3/17 Ileocolonic anastomosis, cholecystectomy, and abdominal closure   Central access: PICC 3/17 TPN start date: 3/18   Nutritional Goals: Goal TPN rate is 70 mL/hr (provides 92 g of protein and 1730 kcals per day) - Per Surgery, keep K >/= 4, Mg >/= 2, Phos >/= 3  RD Assessment: Estimated Needs Total Energy Estimated Needs: 1600-1750 kcals Total Protein Estimated Needs: 85-100 grams Total Fluid Estimated Needs: >/= 1.6L  Current Nutrition:  NPO, TPN  Plan:   Continue TPN at goal rate 70 mL/hr at 1800 Electrolytes in TPN: no changes today -    Na - 25 mEq/L K - 70 mEq/L Ca - 5 mEq/L Mg - 10 mEq/L Phos - 15 mmol/L Cl:Ac ratio - max Ac Add standard MVI and trace elements to TPN Chromium remains on hold d/t national backorder  DC SSI/CBGs on 3/23 MIVF per MD  (none currently) Monitor TPN labs on Mon/Thurs Plan to recheck Tg on 3/27   Herby Abraham, Pharm.D Use secure chat for questions 11/07/2023 10:42 AM

## 2023-11-07 NOTE — Progress Notes (Signed)
  Progress Note   Patient: Stacy [name] UEA:[id number] DOB: 1980/09/12 DOA: 10/29/2023     9 DOS: the patient was seen and examined on 11/07/2023 at 9:06AM      Brief hospital course: 43 y.o. F with hx chronic constipation, SIBO (treated years ago), underweight, iron deficiency anemia and orthostatic hypotension who presented with severe abdominal pain, CT showed perforated colon.     Assessment and Plan: * Colon perforation [facility]) S/p ex lap and RIGHT colectomy left in discontinuity by Dr. Fredricka Bonine on 3/15 S/p ex lap with primary ileocolonic anastomosis and cholecystectomy by Dr. Andrey Campanile 3/17  Post-operative Ileus Still nonresolving ileus CT from 3/23 reassuring.  Shows only chronic bowel wall thickening Completed 7 days micafungin and Zosyn, WBC improving.  Discussed with Surgery, abdomen exam benign, will d/c antibiotics - TPN per Surgery - Analgesics per Surgery - NG per surgery - Dressings per Surgery - Stop micafungin and Zosyn    Positive blood culture CoNS in 1/2, likely contaminant, no further work up.  Malnutrition of moderate degree Ileus - Continue TPN - Keep K>4, mag>2, phos>3 - Continue coverage SS insulin - Can resume home Megace at discharge  Chronic orthostatic hypotension Chronically on midodrine and Florinef. - Resume Florinef and midodrine when able to take PO  Normocytic anemia Iron deficiency anemia B12 deficiency ruled out.   Given IV iron           Subjective: No new complaints, no nursing concerns.  CT from yesterday reassuring     Physical Exam: BP (!) 137/95 (BP Location: Left Arm)   Pulse (!) 108   Temp 97.8 F (36.6 C) (Oral)   Resp 14   Ht 5\' 3"  (1.6 m)   Wt 51 kg   SpO2 95%   BMI 19.92 kg/m   Thin adult female, lying in bed, interactive and appropriate Tachycardic, regular, no murmurs, no peripheral edema Respiratory rate normal, lungs clear without rales or wheezes Abdominal binder in place, diffuse tenderness  but no rigidity or rebound Attention normal, affect pleasant, judgment and insight appear normal, face symmetric, speech fluent, generalized weakness but symmetric strength    Data Reviewed: Discussed with general surgery Magnesium, phosphorus, potassium, creatinine normal Albumin 1.8 white blood cell count slightly down  Family Communication: Husband at the bedside    Disposition: Status is: Inpatient         Author: Alberteen Sam, MD 11/07/2023 3:37 PM  For on call review [email or web address].

## 2023-11-07 NOTE — Plan of Care (Signed)
 ?  Problem: Clinical Measurements: ?Goal: Will remain free from infection ?Outcome: Progressing ?  ?

## 2023-11-07 NOTE — Progress Notes (Signed)
 7 Days Post-Op   Subjective/Chief Complaint: Patient about the same.  Denies abdominal pain.  Reviewed CT scan findings with husband and wife today.  She is having some flatus but no bowel movement.   Objective: Vital signs in last 24 hours: Temp:  [97.5 F (36.4 C)-98.5 F (36.9 C)] 97.5 F (36.4 C) (03/24 0931) Pulse Rate:  [99-113] 113 (03/24 0931) Resp:  [14-18] 14 (03/24 0941) BP: (100-126)/(74-89) 100/74 (03/24 0931) SpO2:  [94 %-98 %] 98 % (03/24 0931) Weight:  [51 kg] 51 kg (03/24 0500) Last BM Date :  (PTA)  Intake/Output from previous day: 03/23 0701 - 03/24 0700 In: 1051.2 [I.V.:892.4; IV Piggyback:158.8] Out: 3550 [Urine:1700; Emesis/NG output:1850] Intake/Output this shift: Total I/O In: -  Out: 1500 [Urine:900; Emesis/NG output:600]  Abdomen: Incisions clean.  It is open to being packed.  No rebound or guarding.  Lab Results:  Recent Labs    11/06/23 0247 11/07/23 0210  WBC 15.4* 14.5*  HGB 8.2* 7.8*  HCT 27.1* 25.6*  PLT 447* 574*   BMET Recent Labs    11/06/23 0247 11/07/23 0210  NA 146* 140  K 4.3 4.4  CL 116* 107  CO2 22 24  GLUCOSE 132* 126*  BUN 21* 20  CREATININE 0.39* <0.30*  CALCIUM 8.0* 8.1*   PT/INR No results for input(s): "LABPROT", "INR" in the last 72 hours. ABG No results for input(s): "PHART", "HCO3" in the last 72 hours.  Invalid input(s): "PCO2", "PO2"  Studies/Results: CT ABDOMEN PELVIS W CONTRAST Result Date: 11/06/2023 CLINICAL DATA:  Postoperative abdominal pain EXAM: CT ABDOMEN AND PELVIS WITH CONTRAST TECHNIQUE: Multidetector CT imaging of the abdomen and pelvis was performed using the standard protocol following bolus administration of intravenous contrast. RADIATION DOSE REDUCTION: This exam was performed according to the departmental dose-optimization program which includes automated exposure control, adjustment of the mA and/or kV according to patient size and/or use of iterative reconstruction technique.  CONTRAST:  80mL OMNIPAQUE IOHEXOL 300 MG/ML  SOLN COMPARISON:  CT abdomen pelvis, 10/29/2023, MR abdomen 09/03/2016 FINDINGS: Lower chest: Moderate left, small right pleural effusions and associated atelectasis or consolidation, all findings increased compared to prior examination. 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 Status post cholecystectomy. Unchanged mild postoperative biliary ductal dilatation. Pancreas: Unremarkable. No pancreatic ductal dilatation or surrounding inflammatory changes. Spleen: Normal in size without significant abnormality. Adrenals/Urinary Tract: Adrenal glands are unremarkable. Unchanged tiny heterogeneous lesion of the anterior midportion of the right kidney measuring 0.8 cm (series 2, image 45). The left kidney is normal, without renal calculi, solid lesion, or hydronephrosis. B Foley catheter in the bladder. Stomach/Bowel: Stomach is within normal limits. Status post interval right hemicolectomy and ileocolic anastomosis. Diffusely thickened small bowel and colon throughout the abdomen and pelvis, similar in appearance to prior examination. Vascular/Lymphatic: No significant vascular findings are present. No enlarged abdominal or pelvic lymph nodes. Reproductive: No mass or other significant abnormality. Bilateral Essure tubal occlusion devices. Other: Status post midline laparotomy. Anasarca. Moderate volume ascites, increased compared to prior examination, with marked, diffuse peritoneal thickening and hyperenhancement throughout the abdomen and pelvis. Musculoskeletal: No acute or significant osseous findings. IMPRESSION: 1. Status post interval right hemicolectomy and ileocolic anastomosis. 2. Diffusely thickened small bowel and colon throughout the abdomen  and pelvis, similar in appearance to prior examination.  Findings are consistent with diffuse, nonspecific infectious or inflammatory enterocolitis. 3. Moderate volume ascites, increased compared to prior examination, with marked, diffuse peritoneal thickening and hyperenhancement throughout the abdomen and pelvis. Findings are consistent with peritonitis. 4. Moderate left, small right pleural effusions and associated atelectasis or consolidation, all findings increased compared to prior examination. 5. Unchanged tiny heterogeneous lesion of the anterior midportion of the right kidney measuring 0.8 cm. This is too small to characterize by CT or MR, but remains suspicious for a small renal neoplasm. Consider multiphasic CT or MRI to further evaluate on a nonacute, outpatient basis at the resolution of current clinical presentation. Electronically Signed   By: Jearld Lesch M.D.   On: 11/06/2023 20:32    Anti-infectives: Anti-infectives (From admission, onward)    Start     Dose/Rate Route Frequency Ordered Stop   10/31/23 1100  micafungin (MYCAMINE) 100 mg in sodium chloride 0.9 % 100 mL IVPB        100 mg 105 mL/hr over 1 Hours Intravenous Daily 10/31/23 1031     10/29/23 1800  piperacillin-tazobactam (ZOSYN) IVPB 3.375 g        3.375 g 12.5 mL/hr over 240 Minutes Intravenous Every 8 hours 10/29/23 0918     10/29/23 0930  piperacillin-tazobactam (ZOSYN) IVPB 3.375 g        3.375 g 100 mL/hr over 30 Minutes Intravenous STAT 10/29/23 0918 10/29/23 1005   10/29/23 0615  ceFEPIme (MAXIPIME) 2 g in sodium chloride 0.9 % 100 mL IVPB        2 g 200 mL/hr over 30 Minutes Intravenous  Once 10/29/23 0610 10/29/23 0703   10/29/23 0615  metroNIDAZOLE (FLAGYL) IVPB 500 mg        500 mg 100 mL/hr over 60 Minutes Intravenous  Once 10/29/23 0610 10/29/23 0825       Assessment/Plan: s/p Procedure(s): RE-EXPLORATION OF ABDOMEN, CHOLECYSTECTOMY, ILEOCOLONIC ANASTOMOSIS (N/A)  POD 9/7 , ex lap with extended  right colectomy for perforated transverse colon, Dr. Fredricka Bonine, 3/15, ex lap with ileocolonic anastomosis and cholecystectomy Dr. Andrey Campanile 3/17  - Path w/ diverticulitis and chronic cholecystitis/focal adenomyomatous change - Significant contamination.  Cont abx therapy for likely 5-7 days post op and monitor for post op infection. - Cont NGT, expect post op ileus. Cont TPN - BID WD dressing changes to midline wound - Cont PCA. IV tylenol and robaxin - Keep K >=4, Phos >= 3, Mg >= 2 and mobilize for bowel function - PT to help start mobilizing - Pulm toilet/IS - Appreciate TRH assistance in her care   FEN - NPO, NGT to LIWS, TPN, IVF per primary  VTE - SCDs, Lovenox ID - Zosyn/Micafungin. Afebrile. WBC 14.1-->16.2-->15. CXR yesterday - atelectasis Foley - out 3/18. Replaced 3/20 for retention. Per TRH CT scan from 11/06/2023 reviewed.  Postop ileus and ascites noted which is not expected.  Her albumin is 1.8. No signs of abscess or ongoing infection.  Will stop Zosyn at this point in time.  Will continue TNA for now and antifungal therapy.  Consider trial of removing Foley.  Discussed surgical plan of care with husband and wife today at the bedside.  Staph Capitis +Bcx    LOS: 9 days    Dortha Schwalbe MD 11/07/2023

## 2023-11-08 DIAGNOSIS — K631 Perforation of intestine (nontraumatic): Secondary | ICD-10-CM | POA: Diagnosis not present

## 2023-11-08 LAB — GLUCOSE, CAPILLARY
Glucose-Capillary: 116 mg/dL — ABNORMAL HIGH (ref 70–99)
Glucose-Capillary: 120 mg/dL — ABNORMAL HIGH (ref 70–99)

## 2023-11-08 LAB — CBC
HCT: 25.9 % — ABNORMAL LOW (ref 36.0–46.0)
Hemoglobin: 8.1 g/dL — ABNORMAL LOW (ref 12.0–15.0)
MCH: 28.3 pg (ref 26.0–34.0)
MCHC: 31.3 g/dL (ref 30.0–36.0)
MCV: 90.6 fL (ref 80.0–100.0)
Platelets: 675 10*3/uL — ABNORMAL HIGH (ref 150–400)
RBC: 2.86 MIL/uL — ABNORMAL LOW (ref 3.87–5.11)
RDW: 14.3 % (ref 11.5–15.5)
WBC: 15.7 10*3/uL — ABNORMAL HIGH (ref 4.0–10.5)
nRBC: 0.1 % (ref 0.0–0.2)

## 2023-11-08 MED ORDER — LIDOCAINE 5 % EX PTCH
1.0000 | MEDICATED_PATCH | CUTANEOUS | Status: DC
  Administered 2023-11-08 – 2023-11-15 (×6): 1 via TRANSDERMAL
  Filled 2023-11-08 (×8): qty 1

## 2023-11-08 MED ORDER — ACETAMINOPHEN 10 MG/ML IV SOLN
1000.0000 mg | Freq: Four times a day (QID) | INTRAVENOUS | Status: AC
  Administered 2023-11-08 – 2023-11-09 (×4): 1000 mg via INTRAVENOUS
  Filled 2023-11-08 (×4): qty 100

## 2023-11-08 MED ORDER — TRAVASOL 10 % IV SOLN
INTRAVENOUS | Status: AC
  Filled 2023-11-08: qty 924

## 2023-11-08 NOTE — Progress Notes (Signed)
 Progress Note  8 Days Post-Op  Subjective: Pt reported she was unable to get comfortable overnight, doing a little better this AM. No flatus or BM. Walked once yesterday to the other end of the hall but then was too tired to walk the rest of the day. Discussed CT again and post-op ileus.   Objective: Vital signs in last 24 hours: Temp:  [97.8 F (36.6 C)-98.4 F (36.9 C)] 98.2 F (36.8 C) (03/25 0555) Pulse Rate:  [108-116] 108 (03/25 0555) Resp:  [14-18] 15 (03/25 1127) BP: (125-137)/(90-96) 129/94 (03/25 0555) SpO2:  [95 %-98 %] 96 % (03/25 0555) Weight:  [50.9 kg] 50.9 kg (03/25 0500) Last BM Date :  (PTA)  Intake/Output from previous day: 03/24 0701 - 03/25 0700 In: 2157.7 [I.V.:1767.1; NG/GT:120; IV Piggyback:270.6] Out: 4550 [Urine:3050; Emesis/NG output:1500] Intake/Output this shift: Total I/O In: 0  Out: 350 [Urine:300; Emesis/NG output:50]  PE: General: pleasant, WD, thin female who is laying in bed in NAD Heart: regular, rate, and rhythm.   Lungs:  Respiratory effort nonlabored Abd: soft, appropriately ttp, ND, incision clean, NGT with thin bilious drainage Psych: A&Ox3 with an appropriate affect.    Lab Results:  Recent Labs    11/07/23 0210 11/08/23 0243  WBC 14.5* 15.7*  HGB 7.8* 8.1*  HCT 25.6* 25.9*  PLT 574* 675*   BMET Recent Labs    11/06/23 0247 11/07/23 0210  NA 146* 140  K 4.3 4.4  CL 116* 107  CO2 22 24  GLUCOSE 132* 126*  BUN 21* 20  CREATININE 0.39* <0.30*  CALCIUM 8.0* 8.1*   PT/INR No results for input(s): "LABPROT", "INR" in the last 72 hours. CMP     Component Value Date/Time   NA 140 11/07/2023 0210   NA 137 01/15/2019 0000   K 4.4 11/07/2023 0210   CL 107 11/07/2023 0210   CO2 24 11/07/2023 0210   GLUCOSE 126 (H) 11/07/2023 0210   BUN 20 11/07/2023 0210   BUN 15 01/15/2019 0000   CREATININE <0.30 (L) 11/07/2023 0210   CALCIUM 8.1 (L) 11/07/2023 0210   PROT 5.5 (L) 11/07/2023 0210   ALBUMIN 1.8 (L)  11/07/2023 0210   AST 16 11/07/2023 0210   ALT 16 11/07/2023 0210   ALKPHOS 69 11/07/2023 0210   BILITOT 0.2 11/07/2023 0210   GFRNONAA NOT CALCULATED 11/07/2023 0210   GFRAA >60 05/20/2020 0502   Lipase     Component Value Date/Time   LIPASE 21 04/29/2020 0315       Studies/Results: CT ABDOMEN PELVIS W CONTRAST Result Date: 11/06/2023 CLINICAL DATA:  Postoperative abdominal pain EXAM: CT ABDOMEN AND PELVIS WITH CONTRAST TECHNIQUE: Multidetector CT imaging of the abdomen and pelvis was performed using the standard protocol following bolus administration of intravenous contrast. RADIATION DOSE REDUCTION: This exam was performed according to the departmental dose-optimization program which includes automated exposure control, adjustment of the mA and/or kV according to patient size and/or use of iterative reconstruction technique. CONTRAST:  80mL OMNIPAQUE IOHEXOL 300 MG/ML  SOLN COMPARISON:  CT abdomen pelvis, 10/29/2023, MR abdomen 09/03/2016 FINDINGS: Lower chest: Moderate left, small right pleural effusions and associated atelectasis or consolidation, all findings increased compared to prior examination. 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 Status post cholecystectomy. Unchanged mild postoperative biliary ductal dilatation. Pancreas:  Unremarkable. No pancreatic ductal dilatation or surrounding inflammatory changes. Spleen: Normal in size without significant abnormality. Adrenals/Urinary Tract: Adrenal glands are unremarkable. Unchanged tiny heterogeneous lesion of the anterior midportion of the right kidney measuring 0.8 cm (series 2, image 45). The left kidney is normal, without renal calculi, solid lesion, or hydronephrosis. B Foley catheter in the bladder.  Stomach/Bowel: Stomach is within normal limits. Status post interval right hemicolectomy and ileocolic anastomosis. Diffusely thickened small bowel and colon throughout the abdomen and pelvis, similar in appearance to prior examination. Vascular/Lymphatic: No significant vascular findings are present. No enlarged abdominal or pelvic lymph nodes. Reproductive: No mass or other significant abnormality. Bilateral Essure tubal occlusion devices. Other: Status post midline laparotomy. Anasarca. Moderate volume ascites, increased compared to prior examination, with marked, diffuse peritoneal thickening and hyperenhancement throughout the abdomen and pelvis. Musculoskeletal: No acute or significant osseous findings. IMPRESSION: 1. Status post interval right hemicolectomy and ileocolic anastomosis. 2. Diffusely thickened small bowel and colon throughout the abdomen and pelvis, similar in appearance to prior examination. Findings are consistent with diffuse, nonspecific infectious or inflammatory enterocolitis. 3. Moderate volume ascites, increased compared to prior examination, with marked, diffuse peritoneal thickening and hyperenhancement throughout the abdomen and pelvis. Findings are consistent with peritonitis. 4. Moderate left, small right pleural effusions and associated atelectasis or consolidation, all findings increased compared to prior examination. 5. Unchanged tiny heterogeneous lesion of the anterior midportion of the right kidney measuring 0.8 cm. This is too small to characterize by CT or MR, but remains suspicious for a small renal neoplasm. Consider multiphasic CT or MRI to further evaluate on a nonacute, outpatient basis at the resolution of current clinical presentation. Electronically Signed   By: Jearld Lesch M.D.   On: 11/06/2023 20:32    Anti-infectives: Anti-infectives (From admission, onward)    Start     Dose/Rate Route Frequency Ordered Stop   10/31/23 1100  micafungin (MYCAMINE) 100 mg  in sodium chloride 0.9 % 100 mL IVPB  Status:  Discontinued        100 mg 105 mL/hr over 1 Hours Intravenous Daily 10/31/23 1031 11/07/23 1308   10/29/23 1800  piperacillin-tazobactam (ZOSYN) IVPB 3.375 g  Status:  Discontinued        3.375 g 12.5 mL/hr over 240 Minutes Intravenous Every 8 hours 10/29/23 0918 11/07/23 1308   10/29/23 0930  piperacillin-tazobactam (ZOSYN) IVPB 3.375 g        3.375 g 100 mL/hr over 30 Minutes Intravenous STAT 10/29/23 0918 10/29/23 1005   10/29/23 0615  ceFEPIme (MAXIPIME) 2 g in sodium chloride 0.9 % 100 mL IVPB        2 g 200 mL/hr over 30 Minutes Intravenous  Once 10/29/23 0610 10/29/23 0703   10/29/23 0615  metroNIDAZOLE (FLAGYL) IVPB 500 mg        500 mg 100 mL/hr over 60 Minutes Intravenous  Once 10/29/23 0610 10/29/23 0825        Assessment/Plan  POD 10/8 , ex lap with extended right colectomy for perforated transverse colon, Dr. Fredricka Bonine, 3/15, ex lap with ileocolonic anastomosis and cholecystectomy Dr. Andrey Campanile 3/17  - Path w/ diverticulitis and chronic cholecystitis/focal adenomyomatous change - Significant contamination. Abx continued through yesterday and will monitor off now - CT 3/23 with ileus and ascites but no signs of abscess or ongoing infection  - Cont NGT, expect post op ileus. Cont TPN - BID WD dressing changes to midline wound - Cont PCA. IV tylenol and robaxin - Keep K >=4,  Phos >= 3, Mg >= 2 and mobilize for bowel function - PT to help start mobilizing - Pulm toilet/IS - Appreciate TRH assistance in her care - if no improvement in bowel function with increased mobilization then consider repeat CT toward the end of the week    FEN - NPO, NGT to LIWS, TPN, IVF per primary  VTE - SCDs, Lovenox ID - Zosyn/Micafungin stopped 3/24; afebrile, WBC 15K Foley - out 3/18. Replaced 3/20 for retention. Per TRH - will discuss but I think could be removed today   LOS: 10 days     Juliet Rude, [facility]  Surgery 11/08/2023, 11:45 AM Please see Amion for pager number during day hours 7:00am-4:30pm

## 2023-11-08 NOTE — Progress Notes (Signed)
 PHARMACY - TOTAL PARENTERAL NUTRITION CONSULT NOTE   Indication: Prolonged ileus  Patient Measurements: Height: 5\' 3"  (160 cm) Weight: 50.9 kg (112 lb 3.4 oz) IBW/kg (Calculated) : 52.4 TPN AdjBW (KG): 51.7 Body mass index is 19.88 kg/m.  Assessment: 64 yoF admitted on 3/15 with abdominal pain, found to have pneumoperitoneum, colon perforation, mesenteric mass, diffuse/severe peritonitis, and distended gallbladder.  She is s/p OR on 3/15 and 3/17.  Pharmacy is consulted to dose TPN.    Glucose / Insulin: No hx DM - CBGs remain at goal (100-150) on full-rate TPN SSI/CBGs stopped 3/23 Electrolytes: WNL on 3/24, no labs today Renal: SCr low/stable; BUN WNL; UOP remains adequate per charting (last dose Lasix 3/21) Hepatic: (3/24) AST/ALT, Tbili all WNL, Albumin remains low - Tg previously elevated to nearly 800 (suspect propofol-related) then down to 99 on 3/20, now up to 324> plan to recheck Tg on 3/27  I/O: no BM yet, some flatus reported 3/24 - NG output 1500 ml UOP 3050 ml - no drains or recent diuretics GI Imaging: - 3/15 CT a/p: pneumoperitoneum with multiple foci in ventral abdomen suggesting perforated viscus, diffuse peritonitis 3/23 CT a/p: diffuse, nonspecific infectious or inflammatory Enterocolitis, marked, diffuse peritoneal thickening and hyperenhancement throughout the abdomen and pelvis. Findings are consistent with peritonitis. GI Surgeries / Procedures:  - 3/15 right colectomy for perforated transverse colon with proximal mesenteric mass   - 3/17 Ileocolonic anastomosis, cholecystectomy, and abdominal closure   Central access: PICC 3/17 TPN start date: 3/18   Nutritional Goals: Goal TPN rate is 70 mL/hr (provides 92 g of protein and 1730 kcals per day) - Per Surgery, keep K >/= 4, Mg >/= 2, Phos >/= 3  RD Assessment: Estimated Needs Total Energy Estimated Needs: 1600-1750 kcals Total Protein Estimated Needs: 85-100 grams Total Fluid Estimated Needs: >/=  1.6L  Current Nutrition:  NPO, TPN  Plan:  Continue TPN at goal rate 70 mL/hr at 1800 Electrolytes in TPN:   Na - 25 mEq/L K - 70 mEq/L Ca - 5 mEq/L Mg - 10 mEq/L Phos - 15 mmol/L Cl:Ac ratio - max Ac Add standard MVI and trace elements to TPN Chromium remains on hold d/t national backorder  DC SSI/CBGs on 3/23 MIVF per MD  (none currently) Monitor TPN labs on Mon/Thurs Plan to recheck Tg on 3/27   Herby Abraham, Pharm.D Use secure chat for questions 11/08/2023 7:53 AM

## 2023-11-08 NOTE — Plan of Care (Signed)
   Problem: Coping: Goal: Level of anxiety will decrease Outcome: Progressing   Problem: Pain Managment: Goal: General experience of comfort will improve and/or be controlled Outcome: Progressing

## 2023-11-08 NOTE — Progress Notes (Signed)
 Progress Note   Patient: Stacy [name] OZH:[id number] DOB: 1980/11/16 DOA: 10/29/2023     10 DOS: the patient was seen and examined on 11/08/2023 at 8:40AM      Brief hospital course: 43 y.o. 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  Underwent partial colectomy and later ileocolonic anastomosis.    Post-op course complicated by prolonged ileus.    Assessment and Plan: * Colon perforation [facility]) S/p ex lap and RIGHT colectomy left in discontinuity by Dr. Fredricka Bonine on 3/15 S/p ex lap with primary ileocolonic anastomosis and cholecystectomy by Dr. Andrey Campanile 3/17  Post-operative Ileus Pathology from colon found perforated diverticulitis and pericolic abscess.  It also suggested a fibrinopurulent serositis presumably from bowel rupture.    Since surgery, ileus nonresolving.  WBC trended up and underwent CT abdomen on 3/23 which showed no fluid collections, only diffuse bowel thickening, which is chronic.  Micafungin and Zosyn completed 7 days, stopped 3/24  Pain worse today In light of her chronic constipation on Motegrity, Gen Surg have discussed trial Reglan to promote motility, patient wishes to defer for now. - TPN per Surgery - Analgesics per Surgery - NG per surgery - Dressings per Surgery - Repeat imaging deferred to Surgery, possibly later this week     Malnutrition of moderate degree Ileus Chronic idiopathic constipation Patient has longstanding problems with constipation, abdominal pain, nausea, weight loss and underweight.  She has been evaluated by GI at [facility], at [facility], at [facility].  See notes by Dr. Charleen Kirks on 05/08/19 and Dr. Alycia Rossetti on 06/25/20. Currently follows with Dr. Bosie Clos.    Currently, malnutrition is worsened by ileus - Continue TPN - Keep  K>4, mag>2, phos>3 - Continue coverage SS insulin - Can resume home Megace at discharge   Chronic orthostatic hypotension Chronically on midodrine and Florinef. - Resume Florinef and midodrine when able to take PO   Normocytic anemia Iron deficiency anemia B12 deficiency ruled out.   Given IV iron here. - Start oral iron when able to take PO - Follow up iron level in 6 weeks   Positive blood culture CoNS in 1/2, likely contaminant, no further work up.   Diffuse bowel thickening on CT This is a long-standing issue, chronic, seen on previous scans.           Subjective: Patient has worse pain today, she has had no fever.  No confusion.  Remains tachycardic.     Physical Exam: BP (!) 120/90 (BP Location: Left Arm)   Pulse (!) 107   Temp 98.2 F (36.8 C) (Oral)   Resp 14   Ht 5\' 3"  (1.6 m)   Wt 50.9 kg   SpO2 96%   BMI 19.88 kg/m   Thin adult female, interactive and appropriate, sitting up in bed Tachycardic, regular, no murmurs, no peripheral edema Respiratory normal, lungs clear without rales or wheezes Abdomen with diffuse tenderness, no rigidity or rebound, incision not evaluated Attention normal, pleasant affect, judgment and insight normal, face symmetric, speech fluent, generalized weakness    Data Reviewed: Discussed with general surgery and GI Potassium, magnesium and phosphorus normal.  Albumin low CBC shows persistent leukocytosis    Family Communication: Husband at the bedside    Disposition: Status is: Inpatient 43 year old F with chronic intestinal motility/constipation issues presented with what appears to be perforated diverticulitis  Undergone  surgery, postop course complicated by prolonged ileus  Will need to continue TPN, hopefully ileus will resolve in the coming days/week        Author: Alberteen Sam, MD 11/08/2023 2:27 PM  For on call review [email or web address].

## 2023-11-09 ENCOUNTER — Inpatient Hospital Stay (HOSPITAL_COMMUNITY)

## 2023-11-09 DIAGNOSIS — K567 Ileus, unspecified: Secondary | ICD-10-CM

## 2023-11-09 DIAGNOSIS — I951 Orthostatic hypotension: Secondary | ICD-10-CM | POA: Diagnosis not present

## 2023-11-09 DIAGNOSIS — K631 Perforation of intestine (nontraumatic): Secondary | ICD-10-CM | POA: Diagnosis not present

## 2023-11-09 LAB — CBC
HCT: 26.9 % — ABNORMAL LOW (ref 36.0–46.0)
Hemoglobin: 8.5 g/dL — ABNORMAL LOW (ref 12.0–15.0)
MCH: 29 pg (ref 26.0–34.0)
MCHC: 31.6 g/dL (ref 30.0–36.0)
MCV: 91.8 fL (ref 80.0–100.0)
Platelets: 779 10*3/uL — ABNORMAL HIGH (ref 150–400)
RBC: 2.93 MIL/uL — ABNORMAL LOW (ref 3.87–5.11)
RDW: 14 % (ref 11.5–15.5)
WBC: 16.3 10*3/uL — ABNORMAL HIGH (ref 4.0–10.5)
nRBC: 0 % (ref 0.0–0.2)

## 2023-11-09 LAB — COMPREHENSIVE METABOLIC PANEL
ALT: 93 U/L — ABNORMAL HIGH (ref 0–44)
AST: 56 U/L — ABNORMAL HIGH (ref 15–41)
Albumin: 2.1 g/dL — ABNORMAL LOW (ref 3.5–5.0)
Alkaline Phosphatase: 268 U/L — ABNORMAL HIGH (ref 38–126)
Anion gap: 9 (ref 5–15)
BUN: 20 mg/dL (ref 6–20)
CO2: 24 mmol/L (ref 22–32)
Calcium: 8.7 mg/dL — ABNORMAL LOW (ref 8.9–10.3)
Chloride: 103 mmol/L (ref 98–111)
Creatinine, Ser: 0.35 mg/dL — ABNORMAL LOW (ref 0.44–1.00)
GFR, Estimated: >60 mL/min (ref >60)
Glucose, Bld: 117 mg/dL — ABNORMAL HIGH (ref 70–99)
Potassium: 4.5 mmol/L (ref 3.5–5.1)
Sodium: 136 mmol/L (ref 135–145)
Total Bilirubin: 0.5 mg/dL (ref 0.0–1.2)
Total Protein: 6.4 g/dL — ABNORMAL LOW (ref 6.5–8.1)

## 2023-11-09 MED ORDER — ACETAMINOPHEN 10 MG/ML IV SOLN
1000.0000 mg | Freq: Four times a day (QID) | INTRAVENOUS | Status: AC
  Administered 2023-11-09 – 2023-11-10 (×4): 1000 mg via INTRAVENOUS
  Filled 2023-11-09 (×4): qty 100

## 2023-11-09 MED ORDER — LIDOCAINE HCL 1 % IJ SOLN
INTRAMUSCULAR | Status: AC
  Filled 2023-11-09: qty 20

## 2023-11-09 MED ORDER — TRAVASOL 10 % IV SOLN
INTRAVENOUS | Status: AC
  Filled 2023-11-09: qty 924

## 2023-11-09 NOTE — Progress Notes (Signed)
 Patient ID: Stacy [name], female   DOB: Jan 08, 1981, 43 y.o.   MRN: [id number] Pt presented to [place] dept today for paracentesis. On limited US abd in all 4 quadrants there is only trace ascites present and not enough to safely aspirate at this time. Procedure cancelled. Pt/CCS informed.

## 2023-11-09 NOTE — Plan of Care (Signed)
  Problem: Education: Goal: Knowledge of General Education information will improve Description: Including pain rating scale, medication(s)/side effects and non-pharmacologic comfort measures Outcome: Progressing   Problem: Health Behavior/Discharge Planning: Goal: Ability to manage health-related needs will improve Outcome: Progressing   Problem: Clinical Measurements: Goal: Ability to maintain clinical measurements within normal limits will improve Outcome: Progressing Goal: Will remain free from infection Outcome: Progressing Goal: Diagnostic test results will improve Outcome: Progressing   Problem: Activity: Goal: Risk for activity intolerance will decrease Outcome: Progressing   Problem: Nutrition: Goal: Adequate nutrition will be maintained Outcome: Progressing   Problem: Coping: Goal: Level of anxiety will decrease Outcome: Progressing   Problem: Elimination: Goal: Will not experience complications related to bowel motility Outcome: Progressing Goal: Will not experience complications related to urinary retention Outcome: Progressing   Problem: Pain Managment: Goal: General experience of comfort will improve and/or be controlled Outcome: Progressing   Problem: Safety: Goal: Ability to remain free from injury will improve Outcome: Progressing   Problem: Skin Integrity: Goal: Risk for impaired skin integrity will decrease Outcome: Progressing   Problem: Activity: Goal: Ability to tolerate increased activity will improve Outcome: Progressing

## 2023-11-09 NOTE — Progress Notes (Signed)
 Progress Note  9 Days Post-Op  Subjective: Pt reported improved pain control overnight. No flatus or BM. Walked twice in hall yesterday. Getting up to bathroom to urinate s/p foley removal. Discussed IR eval for possible paracentesis today. Discussed reglan for pro-motility and patient prefers to hold off at this time.   Objective: Vital signs in last 24 hours: Temp:  [97.9 F (36.6 C)-98.9 F (37.2 C)] 98.7 F (37.1 C) (03/25 2328) Pulse Rate:  [102-110] 102 (03/25 2328) Resp:  [13-18] 16 (03/26 0848) BP: (113-135)/(82-100) 135/98 (03/25 2328) SpO2:  [96 %-98 %] 96 % (03/26 0415) FiO2 (%):  [28 %] 28 % (03/25 2332) Weight:  [50.2 kg] 50.2 kg (03/26 0401) Last BM Date :  (PTA)  Intake/Output from previous day: 03/25 0701 - 03/26 0700 In: 1281.9 [P.O.:240; I.V.:841.9; IV Piggyback:200] Out: 3250 [Urine:1900; Emesis/NG output:1350] Intake/Output this shift: Total I/O In: 0  Out: 200 [Urine:200]  PE: General: pleasant, WD, thin female who is laying in bed in NAD Heart: regular, rate, and rhythm.   Lungs:  Respiratory effort nonlabored Abd: soft, appropriately ttp, ND, incision clean, NGT with thin bilious drainage Psych: A&Ox3 with an appropriate affect.    Lab Results:  Recent Labs    11/08/23 0243 11/09/23 0405  WBC 15.7* 16.3*  HGB 8.1* 8.5*  HCT 25.9* 26.9*  PLT 675* 779*   BMET Recent Labs    11/07/23 0210 11/09/23 0405  NA 140 136  K 4.4 4.5  CL 107 103  CO2 24 24  GLUCOSE 126* 117*  BUN 20 20  CREATININE <0.30* 0.35*  CALCIUM 8.1* 8.7*   PT/INR No results for input(s): "LABPROT", "INR" in the last 72 hours. CMP     Component Value Date/Time   NA 136 11/09/2023 0405   NA 137 01/15/2019 0000   K 4.5 11/09/2023 0405   CL 103 11/09/2023 0405   CO2 24 11/09/2023 0405   GLUCOSE 117 (H) 11/09/2023 0405   BUN 20 11/09/2023 0405   BUN 15 01/15/2019 0000   CREATININE 0.35 (L) 11/09/2023 0405   CALCIUM 8.7 (L) 11/09/2023 0405   PROT 6.4 (L)  11/09/2023 0405   ALBUMIN 2.1 (L) 11/09/2023 0405   AST 56 (H) 11/09/2023 0405   ALT 93 (H) 11/09/2023 0405   ALKPHOS 268 (H) 11/09/2023 0405   BILITOT 0.5 11/09/2023 0405   GFRNONAA >60 11/09/2023 0405   GFRAA >60 05/20/2020 0502   Lipase     Component Value Date/Time   LIPASE 21 04/29/2020 0315       Studies/Results: No results found.   Anti-infectives: Anti-infectives (From admission, onward)    Start     Dose/Rate Route Frequency Ordered Stop   10/31/23 1100  micafungin (MYCAMINE) 100 mg in sodium chloride 0.9 % 100 mL IVPB  Status:  Discontinued        100 mg 105 mL/hr over 1 Hours Intravenous Daily 10/31/23 1031 11/07/23 1308   10/29/23 1800  piperacillin-tazobactam (ZOSYN) IVPB 3.375 g  Status:  Discontinued        3.375 g 12.5 mL/hr over 240 Minutes Intravenous Every 8 hours 10/29/23 0918 11/07/23 1308   10/29/23 0930  piperacillin-tazobactam (ZOSYN) IVPB 3.375 g        3.375 g 100 mL/hr over 30 Minutes Intravenous STAT 10/29/23 0918 10/29/23 1005   10/29/23 0615  ceFEPIme (MAXIPIME) 2 g in sodium chloride 0.9 % 100 mL IVPB        2 g 200 mL/hr over 30  Minutes Intravenous  Once 10/29/23 0610 10/29/23 0703   10/29/23 0615  metroNIDAZOLE (FLAGYL) IVPB 500 mg        500 mg 100 mL/hr over 60 Minutes Intravenous  Once 10/29/23 0610 10/29/23 0825        Assessment/Plan  POD 11/9 , ex lap with extended right colectomy for perforated transverse colon, Dr. Fredricka Bonine, 3/15, ex lap with ileocolonic anastomosis and cholecystectomy Dr. Andrey Campanile 3/17  - Path w/ diverticulitis and chronic cholecystitis/focal adenomyomatous change - Significant contamination. Abx continued through 3/24 and will monitor off now - CT 3/23 with ileus and ascites but no signs of abscess or ongoing infection  - Cont NGT, expect post op ileus. Cont TPN - BID WD dressing changes to midline wound - Cont PCA. IV tylenol and robaxin - Keep K >=4, Phos >= 3, Mg >= 2 and mobilize for bowel function - PT  to help start mobilizing - Pulm toilet/IS - Appreciate TRH assistance in her care - consider repeat CT tomorrow or Friday  - IR consult for possible paracentesis    FEN - NPO, NGT to LIWS, TPN, IVF per primary  VTE - SCDs, Lovenox ID - Zosyn/Micafungin stopped 3/24; afebrile, WBC 16K Foley - removed 3/25 and voiding   LOS: 11 days     Juliet Rude, [facility] Surgery 11/09/2023, 9:10 AM Please see Amion for pager number during day hours 7:00am-4:30pm

## 2023-11-09 NOTE — Progress Notes (Signed)
  Progress Note   Patient: Stacy [name] JJO:[id number] DOB: 07/15/1981 DOA: 10/29/2023     11 DOS: the patient was seen and examined on 11/09/2023   Brief hospital course: 43 y.o. F with hx chronic constipation, SIBO (treated years ago), underweight, iron deficiency anemia and orthostatic hypotension who presented with severe abdominal pain, CT showed perforated colon.  Status post surgery.  Assessment and Plan: * Colon perforation [facility]) S/p ex lap and RIGHT colectomy left in discontinuity by Dr. Fredricka Bonine on 3/15 S/p ex lap with primary ileocolonic anastomosis and cholecystectomy by Dr. Andrey Campanile 3/17  Post-operative Ileus Pathology from colon found perforated diverticulitis and pericolic abscess.  It also suggested a fibrinopurulent serositis presumably from bowel rupture.   Since surgery, ileus nonresolving.  WBC trended up and underwent CT abdomen on 3/23 which showed no fluid collections, only diffuse bowel thickening, which is chronic.  Micafungin and Zosyn completed 7 days, stopped 3/24 Continue TPN per surgery, management per surgery   Malnutrition of moderate degree Ileus Chronic idiopathic constipation Longstanding problems with constipation, abdominal pain, nausea, weight loss and underweight.  She has been evaluated by GI at [facility], at [facility], at [facility].  See notes by Dr. Charleen Kirks on 05/08/19 and Dr. Alycia Rossetti on 06/25/20. Currently follows with Dr. Bosie Clos.    Currently, malnutrition is worsened by ileus - Continue TPN - Keep K>4, mag>2, phos>3 - Continue coverage SS insulin - Can resume home Megace at discharge    Chronic orthostatic hypotension Chronically on midodrine and Florinef. - Resume Florinef and midodrine when able to take PO    Normocytic anemia Iron deficiency anemia B12 deficiency ruled out.   Given IV iron here. - Start oral iron when able to take PO - Follow up iron level in 6 weeks    Positive blood culture CoNS in 1/2, likely contaminant, no further work  up.    Diffuse bowel thickening on CT This is a long-standing issue, chronic, seen on previous scans.     Subjective:  Feels ok  Physical Exam: Vitals:   11/09/23 1225 11/09/23 1415 11/09/23 1811 11/09/23 1828  BP:  106/80 120/89   Pulse:  99 (!) 102   Resp: 18 18 18 18   Temp:  97.6 F (36.4 C) 97.7 F (36.5 C)   TempSrc:  Oral Oral   SpO2:  97% 97%   Weight:      Height:       Physical Exam Vitals reviewed.  Constitutional:      General: She is not in acute distress.    Appearance: She is not ill-appearing or toxic-appearing.  Cardiovascular:     Rate and Rhythm: Normal rate and regular rhythm.     Heart sounds: No murmur heard. Pulmonary:     Effort: Pulmonary effort is normal. No respiratory distress.     Breath sounds: No wheezing, rhonchi or rales.  Neurological:     Mental Status: She is alert.  Psychiatric:        Mood and Affect: Mood normal.        Behavior: Behavior normal.     Data Reviewed: CMP noted, mild transaminitis. Hemoglobin stable at 8.5.  Leukocytosis stable at 16.3.  Family Communication: husband   Disposition: Status is: Inpatient Remains inpatient appropriate because: see above     Time spent: 35 minutes  Author: Brendia Sacks, MD 11/09/2023 6:58 PM  For on call review [email or web address].

## 2023-11-09 NOTE — Progress Notes (Signed)
 PT Cancellation Note  Patient Details Name: Stacy [name] MRN: [id number] DOB: [birth year]   Cancelled Treatment:     Therapist in to see pt who is currently resting in bed after ambulating full length of hallway with RW and Supervision from nursing. Pt politely declined. Will continue to check in per POC. Mobility Specialist also following.   Jannet Askew 11/09/2023, 11:25 AM

## 2023-11-09 NOTE — Progress Notes (Signed)
 PHARMACY - TOTAL PARENTERAL NUTRITION CONSULT NOTE   Indication: Prolonged ileus  Patient Measurements: Height: 5\' 3"  (160 cm) Weight: 50.2 kg (110 lb 10.7 oz) IBW/kg (Calculated) : 52.4 TPN AdjBW (KG): 51.7 Body mass index is 19.6 kg/m.  Assessment: 81 yoF admitted on 3/15 with abdominal pain, found to have pneumoperitoneum, colon perforation, mesenteric mass, diffuse/severe peritonitis, and distended gallbladder.  She is s/p OR on 3/15 and 3/17.  Pharmacy is consulted to dose TPN.    Glucose / Insulin: No hx DM - CBGs remain at goal (100-150) on full-rate TPN SSI/CBGs stopped 3/23 Electrolytes: WNL Renal: SCr low/stable; BUN WNL; UOP remains adequate per charting (last dose Lasix 3/21) Hepatic: (3/26) AST/ALT- slightly elevated, alk phos up to 268. Tbili WNL, Albumin remains low - Tg previously elevated to nearly 800 (suspect propofol-related) then down to 99 on 3/20, now up to 324> plan to recheck Tg on 3/27  I/O: no BM yet, some flatus reported 3/24- none on 3/25 - NG output 1350 ml UOP 1900 ml - no drains or recent diuretics GI Imaging: - 3/15 CT a/p: pneumoperitoneum with multiple foci in ventral abdomen suggesting perforated viscus, diffuse peritonitis 3/23 CT a/p: diffuse, nonspecific infectious or inflammatory Enterocolitis, marked, diffuse peritoneal thickening and hyperenhancement throughout the abdomen and pelvis. Findings are consistent with peritonitis. GI Surgeries / Procedures:  - 3/15 right colectomy for perforated transverse colon with proximal mesenteric mass   - 3/17 Ileocolonic anastomosis, cholecystectomy, and abdominal closure   Central access: PICC 3/17 TPN start date: 3/18   Nutritional Goals: Goal TPN rate is 70 mL/hr (provides 92 g of protein and 1730 kcals per day) - Per Surgery, keep K >/= 4, Mg >/= 2, Phos >/= 3  RD Assessment: Estimated Needs Total Energy Estimated Needs: 1600-1750 kcals Total Protein Estimated Needs: 85-100 grams Total  Fluid Estimated Needs: >/= 1.6L  Current Nutrition:  NPO, TPN  Plan:  Continue TPN at goal rate 70 mL/hr at 1800 Electrolytes in TPN:   Na - 25 mEq/L K - 70 mEq/L Ca - 5 mEq/L Mg - 10 mEq/L Phos - 15 mmol/L Cl:Ac ratio - max Ac Add standard MVI and trace elements to TPN Chromium remains on hold d/t national backorder  DC SSI/CBGs on 3/23 MIVF per MD  (none currently) Monitor TPN labs on Mon/Thurs recheck Tg in AM  Herby Abraham, Pharm.D Use secure chat for questions 11/09/2023 8:16 AM

## 2023-11-09 NOTE — Plan of Care (Signed)

## 2023-11-09 NOTE — Progress Notes (Signed)
 Nutrition Follow-up  DOCUMENTATION CODES:   Non-severe (moderate) malnutrition in context of chronic illness  INTERVENTION:   -TPN management per Pharmacy  -Monitor for diet advancement  NUTRITION DIAGNOSIS:   Moderate Malnutrition related to chronic illness as evidenced by mild fat depletion, mild muscle depletion.  Ongoing.  GOAL:   Patient will meet greater than or equal to 90% of their needs  Meeting with TPN  MONITOR:   Vent status, Labs, Weight trends, I & O's  ASSESSMENT:   43 year old woman with a PMH of chronic intestinal pseudoobstruction who presented with severe abdominal pain. Admitted for perforated colon.  3/15 Admit; ex-lap, found to have perforation at mid transverse colon, mesenteric mass and proximal transverse colon, s/p extended right hemicolectomy, wound VAC placement, Left in discontinuity, returned to ICU intubated postop  3/17 re-exploration of abdomen, cholecystectomy, ileocolonic anastomosis; extubated; PICC placed 3/18 TPN initiated  Patient continues to be NPO.  NGT still in place, output: 2850 ml  Plan is for paracentesis today.  TPN continues at goal, 70 ml/hr, providing 1730 kcals, 92g protein.  Admission weight: 110 lbs Current weight: 110 lbs  Medications reviewed.  Labs reviewed: CBGs: 116-125   Diet Order:   Diet Order             Diet NPO time specified Except for: Other (See Comments)  Diet effective now                   EDUCATION NEEDS:   Education needs have been addressed  Skin:  Skin Assessment: Skin Integrity Issues: Skin Integrity Issues:: Incisions Incisions: Abdomen  Last BM:  PTA  Height:   Ht Readings from Last 1 Encounters:  11/04/23 5\' 3"  (1.6 m)    Weight:   Wt Readings from Last 1 Encounters:  11/09/23 50.2 kg   BMI:  Body mass index is 19.6 kg/m.  Estimated Nutritional Needs:   Kcal:  1600-1750 kcals  Protein:  85-100 grams  Fluid:  >/= 1.6L  Tilda Franco, MS, RD,  LDN Inpatient Clinical Dietitian Contact via Secure chat

## 2023-11-10 ENCOUNTER — Inpatient Hospital Stay (HOSPITAL_COMMUNITY)

## 2023-11-10 DIAGNOSIS — K567 Ileus, unspecified: Secondary | ICD-10-CM | POA: Diagnosis not present

## 2023-11-10 DIAGNOSIS — K631 Perforation of intestine (nontraumatic): Secondary | ICD-10-CM | POA: Diagnosis not present

## 2023-11-10 LAB — COMPREHENSIVE METABOLIC PANEL WITH GFR
ALT: 76 U/L — ABNORMAL HIGH (ref 0–44)
AST: 36 U/L (ref 15–41)
Albumin: 2.3 g/dL — ABNORMAL LOW (ref 3.5–5.0)
Alkaline Phosphatase: 276 U/L — ABNORMAL HIGH (ref 38–126)
Anion gap: 8 (ref 5–15)
BUN: 27 mg/dL — ABNORMAL HIGH (ref 6–20)
CO2: 25 mmol/L (ref 22–32)
Calcium: 8.7 mg/dL — ABNORMAL LOW (ref 8.9–10.3)
Chloride: 101 mmol/L (ref 98–111)
Creatinine, Ser: 0.39 mg/dL — ABNORMAL LOW (ref 0.44–1.00)
GFR, Estimated: >60 mL/min (ref >60)
Glucose, Bld: 122 mg/dL — ABNORMAL HIGH (ref 70–99)
Potassium: 4.6 mmol/L (ref 3.5–5.1)
Sodium: 134 mmol/L — ABNORMAL LOW (ref 135–145)
Total Bilirubin: 0.3 mg/dL (ref 0.0–1.2)
Total Protein: 6.9 g/dL (ref 6.5–8.1)

## 2023-11-10 LAB — CBC
HCT: 27.2 % — ABNORMAL LOW (ref 36.0–46.0)
Hemoglobin: 8.4 g/dL — ABNORMAL LOW (ref 12.0–15.0)
MCH: 28.4 pg (ref 26.0–34.0)
MCHC: 30.9 g/dL (ref 30.0–36.0)
MCV: 91.9 fL (ref 80.0–100.0)
Platelets: 904 10*3/uL (ref 150–400)
RBC: 2.96 MIL/uL — ABNORMAL LOW (ref 3.87–5.11)
RDW: 14 % (ref 11.5–15.5)
WBC: 19.1 10*3/uL — ABNORMAL HIGH (ref 4.0–10.5)
nRBC: 0 % (ref 0.0–0.2)

## 2023-11-10 LAB — TRIGLYCERIDES: Triglycerides: 189 mg/dL — ABNORMAL HIGH (ref <150)

## 2023-11-10 LAB — PHOSPHORUS: Phosphorus: 4.8 mg/dL — ABNORMAL HIGH (ref 2.5–4.6)

## 2023-11-10 LAB — MAGNESIUM: Magnesium: 2.2 mg/dL (ref 1.7–2.4)

## 2023-11-10 MED ORDER — TRAVASOL 10 % IV SOLN
INTRAVENOUS | Status: AC
  Filled 2023-11-10: qty 924

## 2023-11-10 MED ORDER — METOCLOPRAMIDE HCL 5 MG/ML IJ SOLN
5.0000 mg | Freq: Four times a day (QID) | INTRAMUSCULAR | Status: DC
  Administered 2023-11-10 – 2023-11-14 (×16): 5 mg via INTRAVENOUS
  Filled 2023-11-10 (×17): qty 2

## 2023-11-10 MED ORDER — ACETAMINOPHEN 10 MG/ML IV SOLN
1000.0000 mg | Freq: Four times a day (QID) | INTRAVENOUS | Status: AC
  Administered 2023-11-10 – 2023-11-11 (×3): 1000 mg via INTRAVENOUS
  Filled 2023-11-10 (×3): qty 100

## 2023-11-10 NOTE — Plan of Care (Signed)
  Problem: Education: Goal: Knowledge of General Education information will improve Description: Including pain rating scale, medication(s)/side effects and non-pharmacologic comfort measures Outcome: Progressing   Problem: Health Behavior/Discharge Planning: Goal: Ability to manage health-related needs will improve Outcome: Progressing   Problem: Clinical Measurements: Goal: Ability to maintain clinical measurements within normal limits will improve Outcome: Progressing Goal: Will remain free from infection Outcome: Progressing Goal: Diagnostic test results will improve Outcome: Progressing Goal: Cardiovascular complication will be avoided Outcome: Progressing   Problem: Activity: Goal: Risk for activity intolerance will decrease Outcome: Progressing   Problem: Nutrition: Goal: Adequate nutrition will be maintained Outcome: Progressing   Problem: Coping: Goal: Level of anxiety will decrease Outcome: Progressing   Problem: Elimination: Goal: Will not experience complications related to bowel motility Outcome: Progressing Goal: Will not experience complications related to urinary retention Outcome: Progressing   Problem: Pain Managment: Goal: General experience of comfort will improve and/or be controlled Outcome: Progressing   Problem: Safety: Goal: Ability to remain free from injury will improve Outcome: Progressing   Problem: Skin Integrity: Goal: Risk for impaired skin integrity will decrease Outcome: Progressing   Problem: Activity: Goal: Ability to tolerate increased activity will improve Outcome: Progressing   Problem: Respiratory: Goal: Ability to maintain a clear airway and adequate ventilation will improve Outcome: Progressing   Problem: Role Relationship: Goal: Method of communication will improve Outcome: Progressing

## 2023-11-10 NOTE — Progress Notes (Signed)
 PHARMACY - TOTAL PARENTERAL NUTRITION CONSULT NOTE   Indication: Prolonged ileus  Patient Measurements: Height: 5\' 3"  (160 cm) Weight: 50.2 kg (110 lb 10.7 oz) IBW/kg (Calculated) : 52.4 TPN AdjBW (KG): 51.7 Body mass index is 19.6 kg/m.  Assessment: 49 yoF admitted on 3/15 with abdominal pain, found to have pneumoperitoneum, colon perforation, mesenteric mass, diffuse/severe peritonitis, and distended gallbladder.  She is s/p OR on 3/15 and 3/17.  Pharmacy is consulted to dose TPN.    Glucose / Insulin: No hx DM - CBGs remain at goal (100-150) on full-rate TPN SSI/CBGs stopped 3/23 Electrolytes: Na 134, K 4.6, Mag 2.2, CoCa 10.06 Phos slightly elevated @ 4.8 Renal: SCr low/stable; BUN up a little @ 27 Hepatic: (3/27) AST - WNL, ALT- slightly elevated, alk phos up to 276. Tbili WNL, Albumin remains low - Tg previously elevated to nearly 800 (suspect propofol-related) then down to 99 on 3/20 3/24 Trig up to 324,  3/27 Trig down to 189 I/O: no BM or flatus,  - NG output 3050 ml UOP foley out, urine x 8 occurrences - no drains or recent diuretics GI Imaging: - 3/15 CT a/p: pneumoperitoneum with multiple foci in ventral abdomen suggesting perforated viscus, diffuse peritonitis 3/23 CT a/p: diffuse, nonspecific infectious or inflammatory Enterocolitis, marked, diffuse peritoneal thickening and hyperenhancement throughout the abdomen and pelvis. Findings are consistent with peritonitis. 3/26 [place] Abd: trace ascites, not enough for paracentesis 3/27 KUB: ordered GI Surgeries / Procedures:  - 3/15 right colectomy for perforated transverse colon with proximal mesenteric mass   - 3/17 Ileocolonic anastomosis, cholecystectomy, and abdominal closure   Central access: PICC 3/17 TPN start date: 3/18   Nutritional Goals: Goal TPN rate is 70 mL/hr (provides 92 g of protein and 1730 kcals per day) - Per Surgery, keep K >/= 4, Mg >/= 2, Phos >/= 3  RD Assessment: Estimated Needs Total  Energy Estimated Needs: 1600-1750 kcals Total Protein Estimated Needs: 85-100 grams Total Fluid Estimated Needs: >/= 1.6L  Current Nutrition:  NPO, TPN  Plan:  Continue TPN at goal rate 70 mL/hr at 1800 Electrolytes in TPN:  increase Na & decrease phos, change Max Ac to 1:1 Na - 50 mEq/L K - 70 mEq/L Ca - 5 mEq/L Mg - 10 mEq/L Phos - 8 mmol/L Cl:Ac ratio - 1:1 Add standard MVI and trace elements to TPN Chromium remains on hold d/t national backorder  DC SSI/CBGs on 3/23 MIVF per MD  (none currently) Monitor TPN labs on Mon/Thurs  Herby Abraham, Pharm.D Use secure chat for questions 11/10/2023 8:31 AM

## 2023-11-10 NOTE — TOC Progression Note (Signed)
 Transition of Care [facility]) - Progression Note    Patient Details  Name: Stacy [name] MRN: [id number] Date of Birth: 06/18/1981  [facility] CM/SW Contact  Howell Rucks, RN Phone Number: 11/10/2023, 12:29 PM  Clinical Narrative:   Patient continues on TPN, NGT, PCA. TOC will continue to follow.      Barriers to Discharge: Continued Medical Work up  Expected Discharge Plan and Services       Living arrangements for the past 2 months: Single Family Home                   DME Agency: NA                   Social Determinants of Health (SDOH) Interventions SDOH Screenings   Food Insecurity: No Food Insecurity (10/30/2023)  Housing: Low Risk  (10/30/2023)  Transportation Needs: Patient Unable To Answer (10/30/2023)  Utilities: Patient Unable To Answer (10/30/2023)  Alcohol Screen: Low Risk  (06/09/2023)  Depression (PHQ2-9): Low Risk  (05/17/2023)  Financial Resource Strain: Low Risk  (06/09/2023)  Physical Activity: Insufficiently Active (06/09/2023)  Social Connections: Moderately Integrated (06/09/2023)  Stress: No Stress Concern Present (06/09/2023)  Tobacco Use: Low Risk  (10/30/2023)    Readmission Risk Interventions    11/02/2023    2:10 PM 10/30/2023    8:34 AM  Readmission Risk Prevention Plan  Transportation Screening Complete Complete  PCP or Specialist Appt within 5-7 Days Complete Complete  Home Care Screening Complete Complete  Medication Review (RN CM) Complete Complete

## 2023-11-10 NOTE — Progress Notes (Signed)
 Physical Therapy Treatment Patient Details Name: Stacy [name] MRN: [id number] DOB: [birth year] Today's Date: 11/10/2023   History of Present Illness 43 yo female presents to therapy following hospital admission on 10/29/2023 due to severe and sudden onset of abdominal pain with nausea, vomiting and diarrhea. Pt underwent an exploratory lap with extended R hemicolectomy due to perforated transferse colon and proximal mesenteric mass and placement of wound vac on 3/15 secondary to free air and pseudoobstruction. Pt hospitalization complicated secondary to hypotensive episode and decline in medical status requiring intubation with placement of NG tube. Pt returned to OR for abdominal wound closure on 3/17 and found to be septic. Pt PMH includes but is not limited to: anxiety, benign liver cycst, chronic intestinal pseudo-obstruction, and GERD.    PT Comments  Pt ambulated in hallway 800 ft total with RW!  Pt also performed 4 steps x2.  Pt hoping for BM soon.  Recommended increasing ambulation to 3-4x daily (instead of 2-3x current).   Pt currently with multiple lines and NG tube, however so will need nursing to at least assist getting her started.    If plan is discharge home, recommend the following: A little help with walking and/or transfers;A little help with bathing/dressing/bathroom;Assistance with cooking/housework;Assist for transportation;Help with stairs or ramp for entrance   Can travel by private vehicle        Equipment Recommendations  None recommended by PT    Recommendations for Other Services       Precautions / Restrictions Precautions Precautions: Fall Precaution/Restrictions Comments: abdominal binder, NG tube, PCA, TPN     Mobility  Bed Mobility Overal bed mobility: Needs Assistance Bed Mobility: Rolling, Sidelying to Sit Rolling: Contact guard assist Sidelying to sit: Contact guard assist   Sit to supine: Min assist   General bed mobility comments: cues to  maintain abdominal precuations    Transfers Overall transfer level: Needs assistance Equipment used: Rolling walker (2 wheels) Transfers: Sit to/from Stand Sit to Stand: Contact guard assist           General transfer comment: CGA for safety, multiple lines    Ambulation/Gait Ambulation/Gait assistance: Contact guard assist Gait Distance (Feet): 800 Feet Assistive device: Rolling walker (2 wheels) Gait Pattern/deviations: Step-through pattern, Decreased stride length, Trunk flexed       General Gait Details: cues for posture as tolerated   Stairs Stairs: Yes Stairs assistance: Contact guard assist Stair Management: Forwards, Two rails, Alternating pattern Number of Stairs: 4 General stair comments: performed twice, utilized rails for stability, no physical assist required   Wheelchair Mobility     Tilt Bed    Modified Rankin (Stroke Patients Only)       Balance                                            Communication Communication Communication: No apparent difficulties  Cognition Arousal: Alert Behavior During Therapy: WFL for tasks assessed/performed, Flat affect   PT - Cognitive impairments: No apparent impairments                         Following commands: Intact      Cueing    Exercises      General Comments        Pertinent Vitals/Pain Pain Assessment Pain Assessment: Faces Faces Pain Scale: Hurts even more Pain Location:  abdomen Pain Descriptors / Indicators: Guarding, Grimacing Pain Intervention(s): Monitored during session, PCA encouraged, Repositioned    Home Living                          Prior Function            PT Goals (current goals can now be found in the care plan section) Progress towards PT goals: Progressing toward goals    Frequency    Min 2X/week      PT Plan      Co-evaluation              AM-PAC PT "6 Clicks" Mobility   Outcome Measure  Help  needed turning from your back to your side while in a flat bed without using bedrails?: A Little Help needed moving from lying on your back to sitting on the side of a flat bed without using bedrails?: A Little Help needed moving to and from a bed to a chair (including a wheelchair)?: A Little Help needed standing up from a chair using your arms (e.g., wheelchair or bedside chair)?: A Little Help needed to walk in hospital room?: A Little Help needed climbing 3-5 steps with a railing? : A Little 6 Click Score: 18    End of Session   Activity Tolerance: Patient tolerated treatment well Patient left: in bed;with call bell/phone within reach;with family/visitor present   PT Visit Diagnosis: Difficulty in walking, not elsewhere classified (R26.2)     Time: 4540-9811 PT Time Calculation (min) (ACUTE ONLY): 25 min  Charges:    $Gait Training: 23-37 mins PT General Charges $$ ACUTE PT VISIT: 1 Visit                     Paulino Door, DPT Physical Therapist Acute Rehabilitation Services Office: [phone]    [name] 11/10/2023, 1:36 PM

## 2023-11-10 NOTE — Progress Notes (Signed)
 Progress Note   Patient: Stacy [name] [id number]:[id number] DOB: 11-19-1980 DOA: 10/29/2023     12 DOS: the patient was seen and examined on 11/10/2023   Brief hospital course: 43 y.o. F with hx chronic constipation, SIBO (treated years ago), underweight, iron deficiency anemia and orthostatic hypotension who presented with severe abdominal pain, CT showed perforated colon.  Status post surgery.  Assessment and Plan: * Colon perforation [facility]) S/p ex lap and RIGHT colectomy left in discontinuity by Dr. Fredricka Bonine on 3/15 S/p ex lap with primary ileocolonic anastomosis and cholecystectomy by Dr. Andrey Campanile 3/17  Post-operative Ileus Pathology from colon found perforated diverticulitis and pericolic abscess. It also suggested a fibrinopurulent serositis presumably from bowel rupture.   Since surgery, ileus nonresolving.  WBC trended up and underwent CT abdomen on 3/23 which showed no fluid collections, only diffuse bowel thickening, which is chronic.  Micafungin and Zosyn completed 7 days, stopped 3/24 Continue TPN per surgery, management per surgery WBC up a bit, defer any further imaging to general surgery   Malnutrition of moderate degree Ileus Chronic idiopathic constipation Longstanding problems with constipation, abdominal pain, nausea, weight loss and underweight.  She has been evaluated by GI at [facility], at [facility], at [facility].  See notes by Dr. Charleen Kirks on 05/08/19 and Dr. Alycia Rossetti on 06/25/20. Currently follows with Dr. Bosie Clos.    Currently, malnutrition worsened by ileus - Continue TPN - Keep K>4, mag>2, phos>3 - Continue coverage SS insulin - Can resume home Megace at discharge    Chronic orthostatic hypotension Chronically on midodrine and Florinef. - Resume Florinef and midodrine when able to take PO    Normocytic anemia Iron deficiency anemia B12 deficiency ruled out.   Given IV iron here. - Start oral iron when able to take PO - Follow up iron level in 6 weeks    Positive blood  culture CoNS in 1/2, likely contaminant, no further work up.    Diffuse bowel thickening on CT This is a long-standing issue, chronic, seen on previous scans.   Non-severe (moderate) malnutrition in context of chronic illness TPN management per Pharmacy    Subjective:  Feels better, doing well  Physical Exam: Vitals:   11/10/23 0125 11/10/23 0352 11/10/23 0526 11/10/23 0730  BP: 108/74  98/69   Pulse: (!) 104     Resp: 20 14 18 13   Temp: 97.8 F (36.6 C)  99.2 F (37.3 C)   TempSrc: Oral  Oral   SpO2: 97%  98%   Weight:      Height:       Physical Exam Vitals reviewed.  Constitutional:      General: She is not in acute distress.    Appearance: She is not ill-appearing or toxic-appearing.  Cardiovascular:     Rate and Rhythm: Normal rate and regular rhythm.     Heart sounds: No murmur heard. Pulmonary:     Effort: Pulmonary effort is normal. No respiratory distress.     Breath sounds: No wheezing, rhonchi or rales.  Neurological:     Mental Status: She is alert.  Psychiatric:        Mood and Affect: Mood normal.        Behavior: Behavior normal.     Data Reviewed: BMP noted ALT down to 76 Platelets up to 904, hemoglobin stable at 8.4, WBC slightly up to 19.1  Family Communication: Husband at bedside  Disposition: Status is: Inpatient Remains inpatient appropriate because: s/p surgery     Time spent: 70  minutes  Author: Brendia Sacks, MD 11/10/2023 8:03 AM  For on call review [email or web address].

## 2023-11-10 NOTE — Progress Notes (Signed)
 Progress Note  10 Days Post-Op  Subjective: Pt still with no flatus or BM. Increasing mobility. Pain control good. Discussed reglan and patient would like to try this today. Husband at bedside. Will plan repeat CT tomorrow.   Objective: Vital signs in last 24 hours: Temp:  [97.6 F (36.4 C)-99.2 F (37.3 C)] 98.2 F (36.8 C) (03/27 0842) Pulse Rate:  [99-104] 100 (03/27 0842) Resp:  [12-20] 16 (03/27 0842) BP: (98-130)/(69-89) 101/69 (03/27 0842) SpO2:  [97 %-99 %] 99 % (03/27 0842) Last BM Date :  (pta)  Intake/Output from previous day: 03/26 0701 - 03/27 0700 In: 1531.7 [P.O.:240; I.V.:891.7; IV Piggyback:400] Out: 3250 [Urine:200; Emesis/NG output:3050] Intake/Output this shift: Total I/O In: 0  Out: 850 [Emesis/NG output:850]  PE: General: pleasant, WD, thin female who is laying in bed in NAD Heart: regular, rate, and rhythm.   Lungs:  Respiratory effort nonlabored Abd: soft, appropriately ttp, ND, incision clean, NGT with thin bilious drainage Psych: A&Ox3 with an appropriate affect.    Lab Results:  Recent Labs    11/09/23 0405 11/10/23 0832  WBC 16.3* 19.1*  HGB 8.5* 8.4*  HCT 26.9* 27.2*  PLT 779* 904*   BMET Recent Labs    11/09/23 0405 11/10/23 0344  NA 136 134*  K 4.5 4.6  CL 103 101  CO2 24 25  GLUCOSE 117* 122*  BUN 20 27*  CREATININE 0.35* 0.39*  CALCIUM 8.7* 8.7*   PT/INR No results for input(s): "LABPROT", "INR" in the last 72 hours. CMP     Component Value Date/Time   NA 134 (L) 11/10/2023 0344   NA 137 01/15/2019 0000   K 4.6 11/10/2023 0344   CL 101 11/10/2023 0344   CO2 25 11/10/2023 0344   GLUCOSE 122 (H) 11/10/2023 0344   BUN 27 (H) 11/10/2023 0344   BUN 15 01/15/2019 0000   CREATININE 0.39 (L) 11/10/2023 0344   CALCIUM 8.7 (L) 11/10/2023 0344   PROT 6.9 11/10/2023 0344   ALBUMIN 2.3 (L) 11/10/2023 0344   AST 36 11/10/2023 0344   ALT 76 (H) 11/10/2023 0344   ALKPHOS 276 (H) 11/10/2023 0344   BILITOT 0.3  11/10/2023 0344   GFRNONAA >60 11/10/2023 0344   GFRAA >60 05/20/2020 0502   Lipase     Component Value Date/Time   LIPASE 21 04/29/2020 0315       Studies/Results: US Abdomen Limited Result Date: 11/09/2023 CLINICAL DATA:  Patient with history of exploratory laparoscopy with right colectomy for perforated transverse colon on 10/29/2023, exploratory lap with ileocolonic anastomosis and cholecystectomy on 10/31/2023; ascites noted on previous imaging; received for possible paracentesis. EXAM: LIMITED ABDOMEN ULTRASOUND FOR ASCITES TECHNIQUE: Limited ultrasound survey for ascites was performed in all four abdominal quadrants. Only trace ascites was noted. COMPARISON:  CT abdomen and pelvis on 11/06/2023 FINDINGS: Trace ascites noted on limited abdominal ultrasound all 4 quadrants IMPRESSION: Trace ascites present.  Paracentesis not performed today. Performed by: Artemio Aly Electronically Signed   By: Irish Lack M.D.   On: 11/09/2023 16:39     Anti-infectives: Anti-infectives (From admission, onward)    Start     Dose/Rate Route Frequency Ordered Stop   10/31/23 1100  micafungin (MYCAMINE) 100 mg in sodium chloride 0.9 % 100 mL IVPB  Status:  Discontinued        100 mg 105 mL/hr over 1 Hours Intravenous Daily 10/31/23 1031 11/07/23 1308   10/29/23 1800  piperacillin-tazobactam (ZOSYN) IVPB 3.375 g  Status:  Discontinued  3.375 g 12.5 mL/hr over 240 Minutes Intravenous Every 8 hours 10/29/23 0918 11/07/23 1308   10/29/23 0930  piperacillin-tazobactam (ZOSYN) IVPB 3.375 g        3.375 g 100 mL/hr over 30 Minutes Intravenous STAT 10/29/23 0918 10/29/23 1005   10/29/23 0615  ceFEPIme (MAXIPIME) 2 g in sodium chloride 0.9 % 100 mL IVPB        2 g 200 mL/hr over 30 Minutes Intravenous  Once 10/29/23 0610 10/29/23 0703   10/29/23 0615  metroNIDAZOLE (FLAGYL) IVPB 500 mg        500 mg 100 mL/hr over 60 Minutes Intravenous  Once 10/29/23 0610 10/29/23 0825         Assessment/Plan  POD 12/10 , ex lap with extended right colectomy for perforated transverse colon, Dr. Fredricka Bonine, 3/15, ex lap with ileocolonic anastomosis and cholecystectomy Dr. Andrey Campanile 3/17  - Path w/ diverticulitis and chronic cholecystitis/focal adenomyomatous change - Significant contamination. Abx continued through 3/24 and will monitor off now - CT 3/23 with ileus and ascites but no signs of abscess or ongoing infection  - Cont NGT, expect post op ileus. Cont TPN - BID WD dressing changes to midline wound - Cont PCA. IV tylenol and robaxin - Keep K >=4, Phos >= 3, Mg >= 2 and mobilize for bowel function - PT to help start mobilizing - Pulm toilet/IS - Appreciate TRH assistance in her care - repeat CT tomorrow  - start reglan today   FEN - NPO, NGT to LIWS, TPN, IVF per primary  VTE - SCDs, Lovenox ID - Zosyn/Micafungin stopped 3/24; afebrile, WBC 19K Foley - removed 3/25 and voiding   LOS: 12 days     Juliet Rude, [facility] Surgery 11/10/2023, 9:34 AM Please see Amion for pager number during day hours 7:00am-4:30pm

## 2023-11-11 ENCOUNTER — Inpatient Hospital Stay (HOSPITAL_COMMUNITY)

## 2023-11-11 DIAGNOSIS — K567 Ileus, unspecified: Secondary | ICD-10-CM | POA: Diagnosis not present

## 2023-11-11 DIAGNOSIS — K631 Perforation of intestine (nontraumatic): Secondary | ICD-10-CM | POA: Diagnosis not present

## 2023-11-11 LAB — CBC
HCT: 26.2 % — ABNORMAL LOW (ref 36.0–46.0)
Hemoglobin: 8.1 g/dL — ABNORMAL LOW (ref 12.0–15.0)
MCH: 28.7 pg (ref 26.0–34.0)
MCHC: 30.9 g/dL (ref 30.0–36.0)
MCV: 92.9 fL (ref 80.0–100.0)
Platelets: 873 10*3/uL — ABNORMAL HIGH (ref 150–400)
RBC: 2.82 MIL/uL — ABNORMAL LOW (ref 3.87–5.11)
RDW: 14.1 % (ref 11.5–15.5)
WBC: 18.7 10*3/uL — ABNORMAL HIGH (ref 4.0–10.5)
nRBC: 0 % (ref 0.0–0.2)

## 2023-11-11 LAB — BODY FLUID CELL COUNT WITH DIFFERENTIAL
Eos, Fluid: 0 %
Lymphs, Fluid: 7 %
Monocyte-Macrophage-Serous Fluid: 5 % — ABNORMAL LOW (ref 50–90)
Neutrophil Count, Fluid: 88 % — ABNORMAL HIGH (ref 0–25)
Total Nucleated Cell Count, Fluid: UNDETERMINED uL (ref 0–1000)

## 2023-11-11 LAB — AMYLASE: Amylase: 97 U/L (ref 28–100)

## 2023-11-11 LAB — LACTATE DEHYDROGENASE, PLEURAL OR PERITONEAL FLUID: LD, Fluid: 1408 U/L

## 2023-11-11 LAB — PROTEIN, PLEURAL OR PERITONEAL FLUID: Total protein, fluid: <3 g/dL

## 2023-11-11 LAB — GLUCOSE, PLEURAL OR PERITONEAL FLUID: Glucose, Fluid: 24 mg/dL

## 2023-11-11 LAB — ALBUMIN, PLEURAL OR PERITONEAL FLUID: Albumin, Fluid: <1.5 g/dL

## 2023-11-11 MED ORDER — LIDOCAINE HCL 1 % IJ SOLN
INTRAMUSCULAR | Status: AC
  Filled 2023-11-11: qty 20

## 2023-11-11 MED ORDER — TRAVASOL 10 % IV SOLN
INTRAVENOUS | Status: AC
  Filled 2023-11-11: qty 924

## 2023-11-11 MED ORDER — IOHEXOL 9 MG/ML PO SOLN
1000.0000 mL | ORAL | Status: AC
  Administered 2023-11-11: 1000 mL via ORAL

## 2023-11-11 MED ORDER — IOHEXOL 300 MG/ML  SOLN
100.0000 mL | Freq: Once | INTRAMUSCULAR | Status: AC | PRN
  Administered 2023-11-11: 100 mL via INTRAVENOUS

## 2023-11-11 MED ORDER — MORPHINE SULFATE (PF) 2 MG/ML IV SOLN
2.0000 mg | INTRAVENOUS | Status: DC | PRN
  Administered 2023-11-11 – 2023-11-13 (×14): 2 mg via INTRAVENOUS
  Filled 2023-11-11 (×14): qty 1

## 2023-11-11 MED ORDER — IOHEXOL 9 MG/ML PO SOLN
ORAL | Status: AC
  Filled 2023-11-11: qty 1000

## 2023-11-11 NOTE — Progress Notes (Signed)
 Mobility Specialist - Progress Note   11/11/23 0938  Mobility  Activity Ambulated with assistance in hallway  Level of Assistance Standby assist, set-up cues, supervision of patient - no hands on  Assistive Device Front wheel walker  Distance Ambulated (ft) 250 ft  Activity Response Tolerated well  Mobility Referral Yes  Mobility visit 1 Mobility  Mobility Specialist Start Time (ACUTE ONLY) [id number]  Mobility Specialist Stop Time (ACUTE ONLY) [id number]  Mobility Specialist Time Calculation (min) (ACUTE ONLY) 14 min   Pt received in bed and agreeable to mobility. No complaints during session. Pt to bed after session with all needs met.    [facility]

## 2023-11-11 NOTE — Progress Notes (Signed)
 Progress Note   Patient: Stacy [name] WUJ:[id number] DOB: 1981-02-20 DOA: 10/29/2023     13 DOS: the patient was seen and examined on 11/11/2023   Brief hospital course: 43 y.o. F with hx chronic constipation, SIBO (treated years ago), underweight, iron deficiency anemia and orthostatic hypotension who presented with severe abdominal pain, CT showed perforated colon.  Status post surgery.  Assessment and Plan: * Colon perforation (HCC) Septic shock secondary to peritonitis, diverticulitis S/p ex lap and RIGHT colectomy left in discontinuity 3/15 S/p ex lap with primary ileocolonic anastomosis and cholecystectomy  Post-operative Ileus Chronic intestinal pseudoobstruction Chronic cholecystitis Admitted by critical care, underwent exploratory laparotomy and return to ICU requiring vasopressors  Pathology from colon found perforated diverticulitis and pericolic abscess. It also suggested a fibrinopurulent serositis presumably from bowel rupture.   Since surgery, ileus nonresolving.  WBC trended up and underwent CT abdomen on 3/23 which showed no fluid collections, only diffuse bowel thickening, which is chronic.  Micafungin and Zosyn completed 7 days, stopped 3/24 Continue TPN per surgery, management per surgery Repeat CT today, interpretation deferred to general surgery.  Status post paracentesis.  Management per surgery.   Malnutrition of moderate degree Ileus Chronic idiopathic constipation Longstanding problems with constipation, abdominal pain, nausea, weight loss and underweight.  She has been evaluated by GI at [facility], at [facility], at [facility].  See notes by Dr. Charleen Kirks on 05/08/19 and Dr. Alycia Rossetti on 06/25/20. Currently follows with Dr. Bosie Clos.    Currently, malnutrition worsened by ileus - Continue TPN - Keep K>4, mag>2, phos>3 - Continue coverage SS insulin - Can resume home Megace at discharge    Chronic orthostatic hypotension Chronically on midodrine and Florinef. - Resume  Florinef and midodrine when able to take PO    Normocytic anemia Iron deficiency anemia B12 deficiency ruled out.   Given IV iron here. - Start oral iron when able to take PO - Follow up iron level in 6 weeks    Positive blood culture CoNS in 1/2, likely contaminant, no further work up.    Diffuse bowel thickening on CT This is a long-standing issue, chronic, seen on previous scans.    Non-severe (moderate) malnutrition in context of chronic illness TPN management per Pharmacy  Right kidney lesion Tiny heterogeneous lesion of the anterior midportion of the right kidney measuring 0.8 cm. This is too small to characterize by CT or MR, but remains suspicious for a small renal neoplasm. Consider multiphasic CT or MRI to further evaluate on a nonacute, outpatient basis at the resolution of current clinical presentation.    Subjective:  Feels ok  Physical Exam: Vitals:   11/11/23 0419 11/11/23 0424 11/11/23 0500 11/11/23 0617  BP: 105/74   105/75  Pulse: (!) 103   (!) 102  Resp: 16 16  16   Temp:    97.9 F (36.6 C)  TempSrc:    Oral  SpO2: 99%   98%  Weight:   46.2 kg   Height:       Physical Exam Vitals reviewed.  Constitutional:      General: She is not in acute distress.    Appearance: She is not ill-appearing or toxic-appearing.  Cardiovascular:     Rate and Rhythm: Normal rate and regular rhythm.     Heart sounds: No murmur heard. Pulmonary:     Effort: Pulmonary effort is normal. No respiratory distress.     Breath sounds: No wheezing, rhonchi or rales.  Neurological:     Mental Status:  She is alert.  Psychiatric:        Mood and Affect: Mood normal.        Behavior: Behavior normal.     Data Reviewed: WBC slightly better at 18.7 Hemoglobin stable at 8.1  Family Communication: Husband at bedside  Disposition: Status is: Inpatient Remains inpatient appropriate because: See above     Time spent: 20 minutes  Author: Brendia Sacks, MD 11/11/2023  8:08 AM  For on call review [email or web address].

## 2023-11-11 NOTE — Progress Notes (Signed)
 PHARMACY - TOTAL PARENTERAL NUTRITION CONSULT NOTE   Indication: Prolonged ileus  Patient Measurements: Height: 5\' 3"  (160 cm) Weight: 46.2 kg (101 lb 13.6 oz) IBW/kg (Calculated) : 52.4 TPN AdjBW (KG): 51.7 Body mass index is 18.04 kg/m.  Assessment: 50 yoF admitted on 3/15 with abdominal pain, found to have pneumoperitoneum, colon perforation, mesenteric mass, diffuse/severe peritonitis, and distended gallbladder. She is s/p OR on 3/15 and 3/17. Pharmacy is consulted to dose TPN.    Glucose / Insulin: No hx DM - CBGs remain at goal (100-150) on full-rate TPN SSI/CBGs stopped 3/23 Electrolytes: (3/27) Na 134, K 4.6, Mag 2.2, CoCa 10.06 Phos slightly elevated @ 4.8 Renal: (3/27) SCr low/stable; BUN up a little @ 27 Hepatic: (3/27) AST - WNL, ALT- slightly elevated, alk phos up to 276. Tbili WNL, Albumin remains low - Tg previously elevated to nearly 800 (suspect propofol-related) then down to 99 on 3/20 3/24 Trig up to 324,  3/27 Trig down to 189 I/O: no BM or flatus,  - PO intake 600 mL - NG output 2000 mL UOP foley out, urine x 10 occurrences - no drains or recent diuretics GI Imaging: - 3/15 CT a/p: pneumoperitoneum with multiple foci in ventral abdomen suggesting perforated viscus, diffuse peritonitis 3/23 CT a/p: diffuse, nonspecific infectious or inflammatory Enterocolitis, marked, diffuse peritoneal thickening and hyperenhancement throughout the abdomen and pelvis. Findings are consistent with peritonitis. 3/26 [place] Abd: trace ascites, not enough for paracentesis 3/27 KUB: ordered 3/27 DG abd: nonobstructive bowel gas pattern  GI Surgeries / Procedures:  - 3/15 right colectomy for perforated transverse colon with proximal mesenteric mass   - 3/17 Ileocolonic anastomosis, cholecystectomy, and abdominal closure   Central access: PICC 3/17 TPN start date: 3/18   Nutritional Goals: Goal TPN rate is 70 mL/hr (provides 92 g of protein and 1730 kcals per day) - Per  Surgery, keep K >/= 4, Mg >/= 2, Phos >/= 3  RD Assessment: Estimated Needs Total Energy Estimated Needs: 1600-1750 kcals Total Protein Estimated Needs: 85-100 grams Total Fluid Estimated Needs: >/= 1.6L  Current Nutrition:  NPO (MD okay with juice or popsicle 2-3x per day), TPN  Plan:  Continue TPN at goal rate 70 mL/hr at 1800 Electrolytes in TPN:   Na - 50 mEq/L K - 70 mEq/L Ca - 5 mEq/L Mg - 10 mEq/L Phos - 8 mmol/L Cl:Ac ratio - 1:1 Add standard MVI and trace elements to TPN Chromium remains on hold d/t national backorder  DC SSI/CBGs on 3/23 MIVF per MD  (none currently) Monitor TPN labs on Mon/Thurs BMP, mag, phos tomorrow, 3/29  Adolphus Birchwood, PharmD-Student 11/11/2023 9:13 AM

## 2023-11-11 NOTE — Procedures (Signed)
 Ultrasound-guided diagnostic paracentesis performed yielding 5 cc of  blood-tinged  fluid. No immediate complications. The fluid was sent to the lab for preordered studies. EBL none.

## 2023-11-11 NOTE — Progress Notes (Signed)
 Progress Note  11 Days Post-Op  Subjective: Had some flatus overnight. No BM yet. Increasing mobility. Pain controlled- reports pain with dressing changes and mobility but otherwise it is improving. Husband on phone.   Objective: Vital signs in last 24 hours: Temp:  [97.9 F (36.6 C)-98.7 F (37.1 C)] 97.9 F (36.6 C) (03/28 0617) Pulse Rate:  [102-110] 102 (03/28 0617) Resp:  [12-20] 16 (03/28 0900) BP: (86-116)/(61-85) 105/75 (03/28 0617) SpO2:  [97 %-100 %] 98 % (03/28 0617) FiO2 (%):  [0 %] 0 % (03/28 0400) Weight:  [46.2 kg] 46.2 kg (03/28 0500) Last BM Date :  (UTD)  Intake/Output from previous day: 03/27 0701 - 03/28 0700 In: 1745.8 [P.O.:600; I.V.:845.8; IV Piggyback:300] Out: 2000 [Emesis/NG output:2000] Intake/Output this shift: No intake/output data recorded.  PE: General: pleasant, WD, thin female who is laying in bed in NAD Heart: regular, rate, and rhythm.   Lungs:  Respiratory effort nonlabored Abd: soft, appropriately ttp, ND, incision clean, NGT with thin bilious drainage (2,079mL/24h, only 450 mL over night shift) Psych: A&Ox3 with an appropriate affect.    Lab Results:  Recent Labs    11/10/23 0832 11/11/23 0406  WBC 19.1* 18.7*  HGB 8.4* 8.1*  HCT 27.2* 26.2*  PLT 904* 873*   BMET Recent Labs    11/09/23 0405 11/10/23 0344  NA 136 134*  K 4.5 4.6  CL 103 101  CO2 24 25  GLUCOSE 117* 122*  BUN 20 27*  CREATININE 0.35* 0.39*  CALCIUM 8.7* 8.7*   PT/INR No results for input(s): "LABPROT", "INR" in the last 72 hours. CMP     Component Value Date/Time   NA 134 (L) 11/10/2023 0344   NA 137 01/15/2019 0000   K 4.6 11/10/2023 0344   CL 101 11/10/2023 0344   CO2 25 11/10/2023 0344   GLUCOSE 122 (H) 11/10/2023 0344   BUN 27 (H) 11/10/2023 0344   BUN 15 01/15/2019 0000   CREATININE 0.39 (L) 11/10/2023 0344   CALCIUM 8.7 (L) 11/10/2023 0344   PROT 6.9 11/10/2023 0344   ALBUMIN 2.3 (L) 11/10/2023 0344   AST 36 11/10/2023 0344    ALT 76 (H) 11/10/2023 0344   ALKPHOS 276 (H) 11/10/2023 0344   BILITOT 0.3 11/10/2023 0344   GFRNONAA >60 11/10/2023 0344   GFRAA >60 05/20/2020 0502   Lipase     Component Value Date/Time   LIPASE 21 04/29/2020 0315       Studies/Results: DG Abd 1 View Result Date: 11/10/2023 CLINICAL DATA:  [address] Ileus (HCC) [address] EXAM: ABDOMEN - 1 VIEW COMPARISON:  11/06/2023 FINDINGS: Enteric tube terminates within the stomach. Nonobstructive bowel gas pattern. There is some enteric contrast within the colon. No gross free intraperitoneal air on supine imaging. Cholecystectomy clips and bilateral tubal occlusion devices. IMPRESSION: Nonobstructive bowel gas pattern. Electronically Signed   By: Duanne Guess D.O.   On: 11/10/2023 12:26   US Abdomen Limited Result Date: 11/09/2023 CLINICAL DATA:  Patient with history of exploratory laparoscopy with right colectomy for perforated transverse colon on 10/29/2023, exploratory lap with ileocolonic anastomosis and cholecystectomy on 10/31/2023; ascites noted on previous imaging; received for possible paracentesis. EXAM: LIMITED ABDOMEN ULTRASOUND FOR ASCITES TECHNIQUE: Limited ultrasound survey for ascites was performed in all four abdominal quadrants. Only trace ascites was noted. COMPARISON:  CT abdomen and pelvis on 11/06/2023 FINDINGS: Trace ascites noted on limited abdominal ultrasound all 4 quadrants IMPRESSION: Trace ascites present.  Paracentesis not performed today. Performed by: Artemio Aly Electronically  Signed   By: Irish Lack M.D.   On: 11/09/2023 16:39     Anti-infectives: Anti-infectives (From admission, onward)    Start     Dose/Rate Route Frequency Ordered Stop   10/31/23 1100  micafungin (MYCAMINE) 100 mg in sodium chloride 0.9 % 100 mL IVPB  Status:  Discontinued        100 mg 105 mL/hr over 1 Hours Intravenous Daily 10/31/23 1031 11/07/23 1308   10/29/23 1800  piperacillin-tazobactam (ZOSYN) IVPB 3.375 g  Status:   Discontinued        3.375 g 12.5 mL/hr over 240 Minutes Intravenous Every 8 hours 10/29/23 0918 11/07/23 1308   10/29/23 0930  piperacillin-tazobactam (ZOSYN) IVPB 3.375 g        3.375 g 100 mL/hr over 30 Minutes Intravenous STAT 10/29/23 0918 10/29/23 1005   10/29/23 0615  ceFEPIme (MAXIPIME) 2 g in sodium chloride 0.9 % 100 mL IVPB        2 g 200 mL/hr over 30 Minutes Intravenous  Once 10/29/23 0610 10/29/23 0703   10/29/23 0615  metroNIDAZOLE (FLAGYL) IVPB 500 mg        500 mg 100 mL/hr over 60 Minutes Intravenous  Once 10/29/23 0610 10/29/23 0825        Assessment/Plan  POD 13/11 , ex lap with extended right colectomy for perforated transverse colon, Dr. Fredricka Bonine, 3/15, ex lap with ileocolonic anastomosis and cholecystectomy Dr. Andrey Campanile 3/17  - Path w/ diverticulitis and chronic cholecystitis/focal adenomyomatous change - Significant contamination. Abx continued through 3/24 and will monitor off now - CT 3/23 with ileus and ascites but no signs of abscess or ongoing infection  - Cont NGT, expect post op ileus. Cont TPN. Reglan started 3/27 - BID WD dressing changes to midline wound - D/C PCA. Continue IV tylenol and robaxin, PRN morphine - Keep K >=4, Phos >= 3, Mg >= 2 and mobilize for bowel function - PT to help start mobilizing - Pulm toilet/IS - Appreciate TRH assistance in her care - repeat CT today 3/28    FEN - NPO, NGT to LIWS, TPN, IVF per primary  VTE - SCDs, Lovenox ID - Zosyn/Micafungin stopped 3/24; afebrile, WBC 19K Foley - removed 3/25 and voiding   LOS: 13 days     Adam Phenix, [facility] 11/11/2023, 9:42 AM Please see Amion for pager number during day hours 7:00am-4:30pm

## 2023-11-12 DIAGNOSIS — K631 Perforation of intestine (nontraumatic): Secondary | ICD-10-CM | POA: Diagnosis not present

## 2023-11-12 DIAGNOSIS — K567 Ileus, unspecified: Secondary | ICD-10-CM | POA: Diagnosis not present

## 2023-11-12 DIAGNOSIS — E440 Moderate protein-calorie malnutrition: Secondary | ICD-10-CM | POA: Diagnosis not present

## 2023-11-12 LAB — BASIC METABOLIC PANEL WITH GFR
Anion gap: 10 (ref 5–15)
BUN: 36 mg/dL — ABNORMAL HIGH (ref 6–20)
CO2: 21 mmol/L — ABNORMAL LOW (ref 22–32)
Calcium: 8.9 mg/dL (ref 8.9–10.3)
Chloride: 105 mmol/L (ref 98–111)
Creatinine, Ser: 0.41 mg/dL — ABNORMAL LOW (ref 0.44–1.00)
GFR, Estimated: >60 mL/min (ref >60)
Glucose, Bld: 120 mg/dL — ABNORMAL HIGH (ref 70–99)
Potassium: 4.7 mmol/L (ref 3.5–5.1)
Sodium: 136 mmol/L (ref 135–145)

## 2023-11-12 LAB — PHOSPHORUS: Phosphorus: 4.3 mg/dL (ref 2.5–4.6)

## 2023-11-12 LAB — MAGNESIUM: Magnesium: 2.3 mg/dL (ref 1.7–2.4)

## 2023-11-12 MED ORDER — TRAVASOL 10 % IV SOLN
INTRAVENOUS | Status: AC
  Filled 2023-11-12: qty 924

## 2023-11-12 NOTE — Progress Notes (Addendum)
 PHARMACY - TOTAL PARENTERAL NUTRITION CONSULT NOTE   Indication: Prolonged ileus  Patient Measurements: Height: 5\' 3"  (160 cm) Weight: 46.2 kg (101 lb 13.6 oz) IBW/kg (Calculated) : 52.4 TPN AdjBW (KG): 51.7 Body mass index is 18.04 kg/m.  Assessment: 63 yoF admitted on 3/15 with abdominal pain, found to have pneumoperitoneum, colon perforation, mesenteric mass, diffuse/severe peritonitis, and distended gallbladder. She is s/p OR on 3/15 and 3/17. Pharmacy is consulted to dose TPN.    Glucose / Insulin: No hx DM - CBGs remain at goal (100-150) on full-rate TPN SSI/CBGs stopped 3/23 Electrolytes: CoCa upper end of normal range at 10.3, Mg upper end of normal range at 2.3, K slowly increasing over last few days and now approaching upper end of normal range at 4.7. All other electrolytes WNL Renal: SCr low/stable and slowly up-trending; BUN elevated at 36 Hepatic: (3/27) AST - WNL, ALT- slightly elevated, alk phos up to 276. Tbili WNL, Albumin remains low - Tg previously elevated to nearly 800 (suspect propofol-related) then down to 99 on 3/20 3/24 Trig up to 324,  3/27 Trig down to 189 I/O: no BM or flatus,  - PO intake 288 mL - NG output 2100 mL - UOP foley out, urine x 8 occurrences - no drains or recent diuretics GI Imaging: - 3/15 CT a/p: pneumoperitoneum with multiple foci in ventral abdomen suggesting perforated viscus, diffuse peritonitis 3/23 CT a/p: diffuse, nonspecific infectious or inflammatory Enterocolitis, marked, diffuse peritoneal thickening and hyperenhancement throughout the abdomen and pelvis. Findings are consistent with peritonitis. 3/26 [place] Abd: trace ascites, not enough for paracentesis 3/27 KUB: ordered 3/27 DG abd: nonobstructive bowel gas pattern 3/28 CT a/p: No obstruction. Diffuse small bowel wall thickening predominantly in ileal loops, similar to prior, worrisome for infectious or inflammatory enteritis  GI Surgeries / Procedures:  - 3/15 right  colectomy for perforated transverse colon with proximal mesenteric mass   - 3/17 Ileocolonic anastomosis, cholecystectomy, and abdominal closure   Central access: PICC 3/17 TPN start date: 3/18   Nutritional Goals: Goal TPN rate is 70 mL/hr (provides 92 g of protein and 1730 kcals per day) - Per Surgery, keep K >/= 4, Mg >/= 2, Phos >/= 3  RD Assessment: Estimated Needs Total Energy Estimated Needs: 1600-1750 kcals Total Protein Estimated Needs: 85-100 grams Total Fluid Estimated Needs: >/= 1.6L  Current Nutrition:  NPO (MD okay with juice or popsicle 2-3x per day), TPN  Plan:  Continue TPN at goal rate 70 mL/hr at 1800 Electrolytes in TPN:   Na - 50 mEq/L K - 60 mEq/L Ca - 0 mEq/L Mg - 5 mEq/L Phos - 8 mmol/L Cl:Ac ratio - 1:2 Add standard MVI and trace elements to TPN Chromium remains on hold d/t national backorder DC SSI/CBGs on 3/23 MIVF per MD  (none currently) Monitor TPN labs on Mon/Thurs BMP/Mg tomorrow, 3/30   Cherylin Mylar, PharmD Clinical Pharmacist  3/29/202510:44 AM

## 2023-11-12 NOTE — Progress Notes (Signed)
 Progress Note   Patient: Stacy [name] ZOX:[id number] DOB: [birth year] DOA: 10/29/2023     14 DOS: the patient was seen and examined on 11/12/2023   Brief hospital course: 43 y.o. F with hx chronic constipation, SIBO (treated years ago), underweight, iron deficiency anemia and orthostatic hypotension who presented with severe abdominal pain, CT showed perforated colon.  Status post surgery.  Consultants Critical care General Surgery  Procedures/Events 3/15 admitted by critical care 3/15 exploratory laparotomy, extended right hemicolectomy, application of ABThera wound VAC  3/17 reexploration of abdomen, cholecystectomy, ileocolonic anastomosis  Assessment and Plan: * Colon perforation (HCC) Septic shock secondary to peritonitis, diverticulitis S/p ex lap and RIGHT colectomy left in discontinuity 3/15 S/p ex lap with primary ileocolonic anastomosis and cholecystectomy  Post-operative Ileus Chronic intestinal pseudoobstruction Chronic cholecystitis Admitted by critical care, underwent exploratory laparotomy and return to ICU requiring vasopressors  Pathology from colon found perforated diverticulitis and pericolic abscess. It also suggested a fibrinopurulent serositis presumably from bowel rupture.   Since surgery, ileus nonresolving.  WBC trended up and underwent CT abdomen on 3/23 which showed no fluid collections, only diffuse bowel thickening, which is chronic.  Micafungin and Zosyn completed 7 days, stopped 3/24 Continue TPN per surgery, management per surgery   Malnutrition of moderate degree Ileus Chronic idiopathic constipation Longstanding problems with constipation, abdominal pain, nausea, weight loss and underweight.  She has been evaluated by GI at [facility], at [facility], at [facility].  See notes by Dr. Charleen Kirks on 05/08/19 and Dr. Alycia Rossetti on 06/25/20. Currently follows with Dr. Bosie Clos.    Currently, malnutrition worsened by ileus - Continue TPN - Keep K>4, mag>2, phos>3 -  Continue coverage SS insulin - Can resume home Megace at discharge    Chronic orthostatic hypotension Chronically on midodrine and Florinef. - Resume Florinef and midodrine when able to take PO    Normocytic anemia Iron deficiency anemia B12 deficiency ruled out.   Given IV iron here. - Start oral iron when able to take PO - Follow up iron level in 6 weeks    Positive blood culture CoNS in 1/2, likely contaminant, no further work up.    Diffuse bowel thickening on CT Long-standing issue, chronic, seen on previous scans.    Non-severe (moderate) malnutrition in context of chronic illness TPN management per Pharmacy   Right kidney lesion Tiny heterogeneous lesion of the anterior midportion of the right kidney measuring 0.8 cm. This is too small to characterize by CT or MR, but remains suspicious for a small renal neoplasm. Consider multiphasic CT or MRI to further evaluate on a nonacute, outpatient basis at the resolution of current clinical presentation.      Subjective:  Better day today Hoping to go home soon  Physical Exam: Vitals:   11/11/23 1659 11/11/23 2238 11/12/23 0648 11/12/23 1329  BP: 107/76 102/76 105/76 104/76  Pulse: (!) 108 (!) 108 100 (!) 107  Resp: 16 16 16 16   Temp: 98.6 F (37 C) 98.4 F (36.9 C) 98.1 F (36.7 C) 97.7 F (36.5 C)  TempSrc: Oral Oral  Oral  SpO2: 98% 97% 98% 100%  Weight:      Height:       Physical Exam Vitals reviewed.  Constitutional:      General: She is not in acute distress.    Appearance: She is not ill-appearing or toxic-appearing.  Cardiovascular:     Rate and Rhythm: Normal rate and regular rhythm.     Heart sounds: No murmur heard. Pulmonary:  Effort: Pulmonary effort is normal. No respiratory distress.     Breath sounds: No wheezing, rhonchi or rales.  Neurological:     Mental Status: She is alert.  Psychiatric:        Mood and Affect: Mood normal.        Behavior: Behavior normal.     Data  Reviewed: BMP noted magnesium and phosphorus within normal limits Peritoneal fluid culture no growth thus far, no WBCs  Family Communication: Husband at bedside  Disposition: Status is: Inpatient Remains inpatient appropriate because: NPO     Time spent: 20 minutes  Author: Brendia Sacks, MD 11/12/2023 2:57 PM  For on call review [email or web address].

## 2023-11-12 NOTE — Progress Notes (Signed)
 12 Days Post-Op   Subjective/Chief Complaint: NO BOWEL FUNCTION YET  EATING A LOT OF ICE    Objective: Vital signs in last 24 hours: Temp:  [97.8 F (36.6 C)-98.6 F (37 C)] 98.1 F (36.7 C) (03/29 0648) Pulse Rate:  [97-108] 100 (03/29 0648) Resp:  [16] 16 (03/29 0648) BP: (102-126)/(74-94) 105/76 (03/29 0648) SpO2:  [97 %-100 %] 98 % (03/29 0648) Last BM Date :  (PTA)  Intake/Output from previous day: 03/28 0701 - 03/29 0700 In: 1755.8 [P.O.:288; I.V.:1467.8] Out: 2100 [Emesis/NG output:2100] Intake/Output this shift: No intake/output data recorded.  WOUND OPEN CLEAN ND sore throughout   Lab Results:  Recent Labs    11/10/23 0832 11/11/23 0406  WBC 19.1* 18.7*  HGB 8.4* 8.1*  HCT 27.2* 26.2*  PLT 904* 873*   BMET Recent Labs    11/10/23 0344 11/12/23 0344  NA 134* 136  K 4.6 4.7  CL 101 105  CO2 25 21*  GLUCOSE 122* 120*  BUN 27* 36*  CREATININE 0.39* 0.41*  CALCIUM 8.7* 8.9   PT/INR No results for input(s): "LABPROT", "INR" in the last 72 hours. ABG No results for input(s): "PHART", "HCO3" in the last 72 hours.  Invalid input(s): "PCO2", "PO2"  Studies/Results: US Paracentesis Result Date: 11/11/2023 INDICATION: Patient with history of exploratory laparoscopy with right colectomy for perforated transverse colon on 10/29/2023, exploratory lap with ileocolonic anastomosis and cholecystectomy on 10/31/2023, trace ascites; request received for diagnostic paracentesis. EXAM: ULTRASOUND GUIDED DIAGNOSTIC PARACENTESIS MEDICATIONS: 6 mL 1% lidocaine COMPLICATIONS: None immediate. PROCEDURE: Informed written consent was obtained from the patient after a discussion of the risks, benefits and alternatives to treatment. A timeout was performed prior to the initiation of the procedure. Initial ultrasound scanning demonstrates a trace amount of ascites within the right lower abdominal quadrant. The right lower abdomen was prepped and draped in the usual sterile  fashion. 1% lidocaine was used for local anesthesia. Following this, a 19 gauge, 7-cm, Yueh catheter was introduced. An ultrasound image was saved for documentation purposes. The paracentesis was performed. The catheter was removed and a dressing was applied. The patient tolerated the procedure well without immediate post procedural complication. FINDINGS: A total of approximately 5 cc of blood-tinged fluid was removed. Sample was sent to the laboratory as requested by the clinical team. IMPRESSION: Successful ultrasound-guided diagnostic paracentesis yielding 5 cc of peritoneal fluid. Performed by: Artemio Aly Electronically Signed   By: Malachy Moan M.D.   On: 11/11/2023 17:13   CT ABDOMEN PELVIS W CONTRAST Result Date: 11/11/2023 CLINICAL DATA:  Postoperative abdominal pain EXAM: CT ABDOMEN AND PELVIS WITH CONTRAST TECHNIQUE: Multidetector CT imaging of the abdomen and pelvis was performed using the standard protocol following bolus administration of intravenous contrast. RADIATION DOSE REDUCTION: This exam was performed according to the departmental dose-optimization program which includes automated exposure control, adjustment of the mA and/or kV according to patient size and/or use of iterative reconstruction technique. CONTRAST:  OMNIPAQUE IOHEXOL 300 MG/ML  SOLN COMPARISON:  CT abdomen and pelvis 11/07/2021. MRI of the abdomen 09/03/2016. FINDINGS: Lower chest: There is atelectasis in the lung bases. Hepatobiliary: There are 2 hypodense lesions in the liver measuring 2.0 cm in the left lobe and 1.3 cm in the right lobe. There is a hypodense lesion in the caudate measuring up to 19 mm. These were previously characterized on MRI and unchanged. The gallbladder is surgically absent. There is no biliary ductal dilatation. Pancreas: Unremarkable. No pancreatic ductal dilatation or surrounding inflammatory changes.  Spleen: Normal in size without focal abnormality. Adrenals/Urinary Tract: The  kidneys and adrenal glands are within normal limits. There is a small air-fluid level in the bladder, but the bladder is otherwise within normal limits. Stomach/Bowel: Patient is status post right hemicolectomy with ileocolic anastomosis. Oral contrast is seen to the level of the distal descending colon. There are scattered air-fluid levels throughout distal small bowel. There is diffuse small bowel wall thickening predominantly in ileal loops which is similar to prior. Jejunal loops appear within normal limits. Nasogastric tube tip is in the body of the stomach. There is no evidence for pneumatosis or free air. Vascular/Lymphatic: No significant vascular findings are present. No enlarged abdominal or pelvic lymph nodes. Reproductive: Essure device is present. The uterus is within normal limits. The ovaries are nonenlarged. Other: There is a moderate amount of free fluid in the lower abdomen similar to the prior study. There is diffuse peritoneal enhancement, also unchanged. There is no focal abdominal wall hernia. Musculoskeletal: No acute findings. IMPRESSION: 1. Status post right hemicolectomy with ileocolic anastomosis. No obstruction. 2. Diffuse small bowel wall thickening predominantly in ileal loops, similar to prior, worrisome for infectious or inflammatory enteritis. 3. Stable moderate amount of ascites with diffuse peritoneal enhancement worrisome for peritonitis. 4. Small air-fluid level in the bladder, possibly related to recent instrumentation. 5. Stable hypodense lesions in the liver, previously characterized on MRI. Electronically Signed   By: Darliss Cheney M.D.   On: 11/11/2023 15:44   DG Abd 1 View Result Date: 11/10/2023 CLINICAL DATA:  [id number] Ileus [facility]) [id number] EXAM: ABDOMEN - 1 VIEW COMPARISON:  11/06/2023 FINDINGS: Enteric tube terminates within the stomach. Nonobstructive bowel gas pattern. There is some enteric contrast within the colon. No gross free intraperitoneal air on supine imaging.  Cholecystectomy clips and bilateral tubal occlusion devices. IMPRESSION: Nonobstructive bowel gas pattern. Electronically Signed   By: Duanne Guess D.O.   On: 11/10/2023 12:26    Anti-infectives: Anti-infectives (From admission, onward)    Start     Dose/Rate Route Frequency Ordered Stop   10/31/23 1100  micafungin (MYCAMINE) 100 mg in sodium chloride 0.9 % 100 mL IVPB  Status:  Discontinued        100 mg 105 mL/hr over 1 Hours Intravenous Daily 10/31/23 1031 11/07/23 1308   10/29/23 1800  piperacillin-tazobactam (ZOSYN) IVPB 3.375 g  Status:  Discontinued        3.375 g 12.5 mL/hr over 240 Minutes Intravenous Every 8 hours 10/29/23 0918 11/07/23 1308   10/29/23 0930  piperacillin-tazobactam (ZOSYN) IVPB 3.375 g        3.375 g 100 mL/hr over 30 Minutes Intravenous STAT 10/29/23 0918 10/29/23 1005   10/29/23 0615  ceFEPIme (MAXIPIME) 2 g in sodium chloride 0.9 % 100 mL IVPB        2 g 200 mL/hr over 30 Minutes Intravenous  Once 10/29/23 0610 10/29/23 0703   10/29/23 0615  metroNIDAZOLE (FLAGYL) IVPB 500 mg        500 mg 100 mL/hr over 60 Minutes Intravenous  Once 10/29/23 0610 10/29/23 0825       Assessment/Plan: s/p Procedure(s): RE-EXPLORATION OF ABDOMEN, CHOLECYSTECTOMY, ILEOCOLONIC ANASTOMOSIS (N/A)  POD 14/12 , ex lap with extended right colectomy for perforated transverse colon, Dr. Fredricka Bonine, 3/15, ex lap with ileocolonic anastomosis and cholecystectomy Dr. Andrey Campanile 3/17  - Path w/ diverticulitis and chronic cholecystitis/focal adenomyomatous change - Significant contamination. Abx continued through 3/24 and will monitor off now - CT 3/28  CONTRAST IN THE  COLON MINIMAL ASCITES [place] TAP YESTERDAY 5 CC BLOODY FLUID      - CLAMP NGT, OK TO DC IN 6 HOURS IF NO N/V     Cont TPN. Reglan started 3/27 - BID WD dressing changes to midline wound - D/C PCA. Continue IV tylenol and robaxin, PRN morphine - Keep K >=4, Phos >= 3, Mg >= 2 and mobilize for bowel function - PT to help  start mobilizing - Pulm toilet/IS - Appreciate TRH assistance in her care    FEN - NPO,  CLAMP NGT, TPN, IVF per primary  VTE - SCDs, Lovenox ID - Zosyn/Micafungin stopped 3/24; afebrile, WBC 19K Foley - removed 3/25 and voiding   LOS: 14 days    Clovis Pu Hattie Aguinaldo MD  11/12/2023

## 2023-11-13 DIAGNOSIS — K567 Ileus, unspecified: Secondary | ICD-10-CM | POA: Diagnosis not present

## 2023-11-13 DIAGNOSIS — K631 Perforation of intestine (nontraumatic): Secondary | ICD-10-CM | POA: Diagnosis not present

## 2023-11-13 DIAGNOSIS — I951 Orthostatic hypotension: Secondary | ICD-10-CM | POA: Diagnosis not present

## 2023-11-13 LAB — MAGNESIUM: Magnesium: 2 mg/dL (ref 1.7–2.4)

## 2023-11-13 LAB — BASIC METABOLIC PANEL WITH GFR
Anion gap: 7 (ref 5–15)
BUN: 37 mg/dL — ABNORMAL HIGH (ref 6–20)
CO2: 21 mmol/L — ABNORMAL LOW (ref 22–32)
Calcium: 8.7 mg/dL — ABNORMAL LOW (ref 8.9–10.3)
Chloride: 108 mmol/L (ref 98–111)
Creatinine, Ser: 0.49 mg/dL (ref 0.44–1.00)
GFR, Estimated: >60 mL/min
Glucose, Bld: 115 mg/dL — ABNORMAL HIGH (ref 70–99)
Potassium: 4.7 mmol/L (ref 3.5–5.1)
Sodium: 136 mmol/L (ref 135–145)

## 2023-11-13 MED ORDER — TRAVASOL 10 % IV SOLN
INTRAVENOUS | Status: AC
  Filled 2023-11-13: qty 924

## 2023-11-13 NOTE — Progress Notes (Signed)
 13 Days Post-Op   Subjective/Chief Complaint: Patient with 2 bowel movements.  No abdominal pain.  No nausea or vomiting.   Objective: Vital signs in last 24 hours: Temp:  [97.7 F (36.5 C)-98.1 F (36.7 C)] 98.1 F (36.7 C) (03/30 0526) Pulse Rate:  [96-107] 101 (03/30 0526) Resp:  [16] 16 (03/30 0526) BP: (104-116)/(76-84) 106/76 (03/30 0526) SpO2:  [97 %-100 %] 97 % (03/30 0526) Weight:  [50.1 kg] 50.1 kg (03/30 0436) Last BM Date :  (PTA)  Intake/Output from previous day: 03/29 0701 - 03/30 0700 In: 2138 [P.O.:720; I.V.:1418] Out: 500 [Emesis/NG output:500] Intake/Output this shift: Total I/O In: 120 [P.O.:120] Out: -   Abdomen: Wound is open and clean.  No distention.  Sore but soft  Lab Results:  Recent Labs    11/11/23 0406  WBC 18.7*  HGB 8.1*  HCT 26.2*  PLT 873*   BMET Recent Labs    11/12/23 0344 11/13/23 0315  NA 136 136  K 4.7 4.7  CL 105 108  CO2 21* 21*  GLUCOSE 120* 115*  BUN 36* 37*  CREATININE 0.41* 0.49  CALCIUM 8.9 8.7*   PT/INR No results for input(s): "LABPROT", "INR" in the last 72 hours. ABG No results for input(s): "PHART", "HCO3" in the last 72 hours.  Invalid input(s): "PCO2", "PO2"  Studies/Results: US Paracentesis Result Date: 11/11/2023 INDICATION: Patient with history of exploratory laparoscopy with right colectomy for perforated transverse colon on 10/29/2023, exploratory lap with ileocolonic anastomosis and cholecystectomy on 10/31/2023, trace ascites; request received for diagnostic paracentesis. EXAM: ULTRASOUND GUIDED DIAGNOSTIC PARACENTESIS MEDICATIONS: 6 mL 1% lidocaine COMPLICATIONS: None immediate. PROCEDURE: Informed written consent was obtained from the patient after a discussion of the risks, benefits and alternatives to treatment. A timeout was performed prior to the initiation of the procedure. Initial ultrasound scanning demonstrates a trace amount of ascites within the right lower abdominal quadrant. The  right lower abdomen was prepped and draped in the usual sterile fashion. 1% lidocaine was used for local anesthesia. Following this, a 19 gauge, 7-cm, Yueh catheter was introduced. An ultrasound image was saved for documentation purposes. The paracentesis was performed. The catheter was removed and a dressing was applied. The patient tolerated the procedure well without immediate post procedural complication. FINDINGS: A total of approximately 5 cc of blood-tinged fluid was removed. Sample was sent to the laboratory as requested by the clinical team. IMPRESSION: Successful ultrasound-guided diagnostic paracentesis yielding 5 cc of peritoneal fluid. Performed by: Artemio Aly Electronically Signed   By: Malachy Moan M.D.   On: 11/11/2023 17:13   CT ABDOMEN PELVIS W CONTRAST Result Date: 11/11/2023 CLINICAL DATA:  Postoperative abdominal pain EXAM: CT ABDOMEN AND PELVIS WITH CONTRAST TECHNIQUE: Multidetector CT imaging of the abdomen and pelvis was performed using the standard protocol following bolus administration of intravenous contrast. RADIATION DOSE REDUCTION: This exam was performed according to the departmental dose-optimization program which includes automated exposure control, adjustment of the mA and/or kV according to patient size and/or use of iterative reconstruction technique. CONTRAST:  OMNIPAQUE IOHEXOL 300 MG/ML  SOLN COMPARISON:  CT abdomen and pelvis 11/07/2021. MRI of the abdomen 09/03/2016. FINDINGS: Lower chest: There is atelectasis in the lung bases. Hepatobiliary: There are 2 hypodense lesions in the liver measuring 2.0 cm in the left lobe and 1.3 cm in the right lobe. There is a hypodense lesion in the caudate measuring up to 19 mm. These were previously characterized on MRI and unchanged. The gallbladder is surgically absent. There  is no biliary ductal dilatation. Pancreas: Unremarkable. No pancreatic ductal dilatation or surrounding inflammatory changes. Spleen: Normal in  size without focal abnormality. Adrenals/Urinary Tract: The kidneys and adrenal glands are within normal limits. There is a small air-fluid level in the bladder, but the bladder is otherwise within normal limits. Stomach/Bowel: Patient is status post right hemicolectomy with ileocolic anastomosis. Oral contrast is seen to the level of the distal descending colon. There are scattered air-fluid levels throughout distal small bowel. There is diffuse small bowel wall thickening predominantly in ileal loops which is similar to prior. Jejunal loops appear within normal limits. Nasogastric tube tip is in the body of the stomach. There is no evidence for pneumatosis or free air. Vascular/Lymphatic: No significant vascular findings are present. No enlarged abdominal or pelvic lymph nodes. Reproductive: Essure device is present. The uterus is within normal limits. The ovaries are nonenlarged. Other: There is a moderate amount of free fluid in the lower abdomen similar to the prior study. There is diffuse peritoneal enhancement, also unchanged. There is no focal abdominal wall hernia. Musculoskeletal: No acute findings. IMPRESSION: 1. Status post right hemicolectomy with ileocolic anastomosis. No obstruction. 2. Diffuse small bowel wall thickening predominantly in ileal loops, similar to prior, worrisome for infectious or inflammatory enteritis. 3. Stable moderate amount of ascites with diffuse peritoneal enhancement worrisome for peritonitis. 4. Small air-fluid level in the bladder, possibly related to recent instrumentation. 5. Stable hypodense lesions in the liver, previously characterized on MRI. Electronically Signed   By: Darliss Cheney M.D.   On: 11/11/2023 15:44    Anti-infectives: Anti-infectives (From admission, onward)    Start     Dose/Rate Route Frequency Ordered Stop   10/31/23 1100  micafungin (MYCAMINE) 100 mg in sodium chloride 0.9 % 100 mL IVPB  Status:  Discontinued        100 mg 105 mL/hr over 1  Hours Intravenous Daily 10/31/23 1031 11/07/23 1308   10/29/23 1800  piperacillin-tazobactam (ZOSYN) IVPB 3.375 g  Status:  Discontinued        3.375 g 12.5 mL/hr over 240 Minutes Intravenous Every 8 hours 10/29/23 0918 11/07/23 1308   10/29/23 0930  piperacillin-tazobactam (ZOSYN) IVPB 3.375 g        3.375 g 100 mL/hr over 30 Minutes Intravenous STAT 10/29/23 0918 10/29/23 1005   10/29/23 0615  ceFEPIme (MAXIPIME) 2 g in sodium chloride 0.9 % 100 mL IVPB        2 g 200 mL/hr over 30 Minutes Intravenous  Once 10/29/23 0610 10/29/23 0703   10/29/23 0615  metroNIDAZOLE (FLAGYL) IVPB 500 mg        500 mg 100 mL/hr over 60 Minutes Intravenous  Once 10/29/23 0610 10/29/23 0825       Assessment/Plan: s/p Procedure(s): RE-EXPLORATION OF ABDOMEN, CHOLECYSTECTOMY, ILEOCOLONIC ANASTOMOSIS (N/A) POD 115/13  , ex lap with extended right colectomy for perforated transverse colon, Dr. Fredricka Bonine, 3/15, ex lap with ileocolonic anastomosis and cholecystectomy Dr. Andrey Campanile 3/17  - Path w/ diverticulitis and chronic cholecystitis/focal adenomyomatous change - Significant contamination. Abx continued through 3/24 and will monitor off now  Ileus-this seems to be resolving.  Advance diet.  May start weaning TNA off tomorrow.  LOS: 15 days    Aryel Edelen A Waverly Tarquinio md  11/13/2023

## 2023-11-13 NOTE — Progress Notes (Signed)
 Progress Note   Patient: Stacy [name] RUE:[id number] DOB: October 23, 1980 DOA: 10/29/2023     15 DOS: the patient was seen and examined on 11/13/2023   Brief hospital course: 43 y.o. F with hx chronic constipation, SIBO (treated years ago), underweight, iron deficiency anemia and orthostatic hypotension who presented with severe abdominal pain, CT showed perforated colon.  Status post surgery.  Consultants Critical care General Surgery  Procedures/Events 3/15 admitted by critical care 3/15 exploratory laparotomy, extended right hemicolectomy, application of ABThera wound VAC  3/17 reexploration of abdomen, cholecystectomy, ileocolonic anastomosis   Assessment and Plan: * Colon perforation (HCC) Septic shock secondary to peritonitis, diverticulitis S/p ex lap and RIGHT colectomy left in discontinuity 3/15 S/p ex lap with primary ileocolonic anastomosis and cholecystectomy  Post-operative Ileus Chronic intestinal pseudoobstruction Chronic cholecystitis Admitted by critical care, underwent exploratory laparotomy and return to ICU requiring vasopressors  Pathology from colon found perforated diverticulitis and pericolic abscess. It also suggested a fibrinopurulent serositis presumably from bowel rupture.   Since surgery, ileus nonresolving.  WBC trended up and underwent CT abdomen on 3/23 which showed no fluid collections, only diffuse bowel thickening, which is chronic.  Micafungin and Zosyn completed 7 days, stopped 3/24 Continue TPN per surgery, management per surgery, hopefully wean as bowels moving now   Malnutrition of moderate degree Ileus Chronic idiopathic constipation Longstanding problems with constipation, abdominal pain, nausea, weight loss and underweight.  She has been evaluated by GI at [facility], at [facility], at [facility].  See notes by Dr. Charleen Kirks on 05/08/19 and Dr. Alycia Rossetti on 06/25/20. Currently follows with Dr. Bosie Clos.    Currently, malnutrition worsened by ileus - Wean  TPN - Keep K>4, mag>2, phos>3 - Continue coverage SS insulin - Can resume home Megace at discharge    Chronic orthostatic hypotension Chronically on midodrine and Florinef. - Resume Florinef and midodrine when able to take PO    Normocytic anemia Iron deficiency anemia B12 deficiency ruled out.   Given IV iron here. - Start oral iron when able to take PO - Follow up iron level in 6 weeks    Positive blood culture CoNS in 1/2, likely contaminant, no further work up.    Diffuse bowel thickening on CT Long-standing issue, chronic, seen on previous scans.    Non-severe (moderate) malnutrition in context of chronic illness TPN management per Pharmacy   Right kidney lesion Tiny heterogeneous lesion of the anterior midportion of the right kidney measuring 0.8 cm. This is too small to characterize by CT or MR, but remains suspicious for a small renal neoplasm. Consider multiphasic CT or MRI to further evaluate on a nonacute, outpatient basis at the resolution of current clinical presentation.      Subjective:  Feels better NGT out Bowels moving  Physical Exam: Vitals:   11/12/23 1329 11/12/23 2215 11/13/23 0436 11/13/23 0526  BP: 104/76 116/84  106/76  Pulse: (!) 107 96  (!) 101  Resp: 16 16  16   Temp: 97.7 F (36.5 C) 98.1 F (36.7 C)  98.1 F (36.7 C)  TempSrc: Oral Oral  Oral  SpO2: 100% 97%  97%  Weight:   50.1 kg   Height:       Physical Exam Vitals reviewed.  Constitutional:      General: She is not in acute distress.    Appearance: She is not ill-appearing or toxic-appearing.  Cardiovascular:     Rate and Rhythm: Normal rate and regular rhythm.     Heart sounds: No murmur  heard. Pulmonary:     Effort: Pulmonary effort is normal. No respiratory distress.     Breath sounds: No wheezing, rhonchi or rales.  Neurological:     Mental Status: She is alert.  Psychiatric:        Mood and Affect: Mood normal.        Behavior: Behavior normal.     Data  Reviewed: BMP noted  Family Communication: husband at bedside  Disposition: Status is: Inpatient Remains inpatient appropriate because: s/p surgery     Time spent: 20 minutes  Author: Brendia Sacks, MD 11/13/2023 7:51 AM  For on call review [email or web address].

## 2023-11-13 NOTE — Progress Notes (Signed)
 PHARMACY - TOTAL PARENTERAL NUTRITION CONSULT NOTE   Indication: Prolonged ileus  Patient Measurements: Height: 5\' 3"  (160 cm) Weight: 50.1 kg (110 lb 7.2 oz) IBW/kg (Calculated) : 52.4 TPN AdjBW (KG): 51.7 Body mass index is 19.57 kg/m.  Assessment: 25 yoF admitted on 3/15 with abdominal pain, found to have pneumoperitoneum, colon perforation, mesenteric mass, diffuse/severe peritonitis, and distended gallbladder. She is s/p OR on 3/15 and 3/17. Pharmacy is consulted to dose TPN.    Glucose / Insulin: No hx DM - CBGs remain at goal (100-150) on full-rate TPN SSI/CBGs stopped 3/23 Electrolytes: CoCa improved to 9.9, Mg improved to 2.0, K slowly increasing over last few days and remains at 4.7 today despite K decrease in TPN yesterday. All other electrolytes WNL Renal: SCr <1 but slowly up-trending; BUN elevated at 37 Hepatic: (3/27) AST - WNL, ALT- slightly elevated, alk phos up to 276. Tbili WNL, Albumin remains low - Tg previously elevated to nearly 800 (suspect propofol-related) then down to 99 on 3/20 3/24 Trig up to 324,  3/27 Trig down to 189 I/O:  - PO intake 720 mL - NG output 500 mL - UOP foley out, urine x 4 occurrences - 2 BMs recorded 3/29  - no drains or recent diuretics GI Imaging: - 3/15 CT a/p: pneumoperitoneum with multiple foci in ventral abdomen suggesting perforated viscus, diffuse peritonitis 3/23 CT a/p: diffuse, nonspecific infectious or inflammatory Enterocolitis, marked, diffuse peritoneal thickening and hyperenhancement throughout the abdomen and pelvis. Findings are consistent with peritonitis. 3/26 [place] Abd: trace ascites, not enough for paracentesis 3/27 KUB: ordered 3/27 DG abd: nonobstructive bowel gas pattern 3/28 CT a/p: No obstruction. Diffuse small bowel wall thickening predominantly in ileal loops, similar to prior, worrisome for infectious or inflammatory enteritis  GI Surgeries / Procedures:  - 3/15 right colectomy for perforated transverse  colon with proximal mesenteric mass   - 3/17 Ileocolonic anastomosis, cholecystectomy, and abdominal closure   Central access: PICC 3/17 TPN start date: 3/18   Nutritional Goals: Goal TPN rate is 70 mL/hr (provides 92 g of protein and 1730 kcals per day) - Per Surgery, keep K >/= 4, Mg >/= 2, Phos >/= 3  RD Assessment: Estimated Needs Total Energy Estimated Needs: 1600-1750 kcals Total Protein Estimated Needs: 85-100 grams Total Fluid Estimated Needs: >/= 1.6L  Current Nutrition:  NPO >> FLD on 3/30 AM, TPN Per surgery, may start weaning TPN off tomorrow, 3/31  Plan:  Continue TPN at goal rate 70 mL/hr at 1800 Electrolytes in TPN:   Na - 50 mEq/L K - 50 mEq/L Ca - 5 mEq/L Mg - 8 mEq/L Phos - 8 mmol/L Cl:Ac ratio - 1:2 Add standard MVI and trace elements to TPN Chromium remains on hold d/t national backorder DC SSI/CBGs on 3/23 MIVF per MD  (none currently) Monitor TPN labs on Mon/Thurs   Cherylin Mylar, PharmD Clinical Pharmacist  3/30/20259:28 AM

## 2023-11-14 DIAGNOSIS — K567 Ileus, unspecified: Secondary | ICD-10-CM | POA: Diagnosis not present

## 2023-11-14 DIAGNOSIS — I951 Orthostatic hypotension: Secondary | ICD-10-CM | POA: Diagnosis not present

## 2023-11-14 DIAGNOSIS — K631 Perforation of intestine (nontraumatic): Secondary | ICD-10-CM | POA: Diagnosis not present

## 2023-11-14 LAB — PATHOLOGIST SMEAR REVIEW

## 2023-11-14 LAB — CBC
HCT: 27.3 % — ABNORMAL LOW (ref 36.0–46.0)
Hemoglobin: 8.2 g/dL — ABNORMAL LOW (ref 12.0–15.0)
MCH: 27.9 pg (ref 26.0–34.0)
MCHC: 30 g/dL (ref 30.0–36.0)
MCV: 92.9 fL (ref 80.0–100.0)
Platelets: 787 10*3/uL — ABNORMAL HIGH (ref 150–400)
RBC: 2.94 MIL/uL — ABNORMAL LOW (ref 3.87–5.11)
RDW: 13.2 % (ref 11.5–15.5)
WBC: 13.8 10*3/uL — ABNORMAL HIGH (ref 4.0–10.5)
nRBC: 0 % (ref 0.0–0.2)

## 2023-11-14 LAB — TRIGLYCERIDES, BODY FLUIDS: Triglycerides, Fluid: 73 mg/dL

## 2023-11-14 LAB — PHOSPHORUS: Phosphorus: 3.7 mg/dL (ref 2.5–4.6)

## 2023-11-14 LAB — COMPREHENSIVE METABOLIC PANEL WITH GFR
ALT: 164 U/L — ABNORMAL HIGH (ref 0–44)
AST: 61 U/L — ABNORMAL HIGH (ref 15–41)
Albumin: 2.5 g/dL — ABNORMAL LOW (ref 3.5–5.0)
Alkaline Phosphatase: 242 U/L — ABNORMAL HIGH (ref 38–126)
Anion gap: 9 (ref 5–15)
BUN: 30 mg/dL — ABNORMAL HIGH (ref 6–20)
CO2: 22 mmol/L (ref 22–32)
Calcium: 8.6 mg/dL — ABNORMAL LOW (ref 8.9–10.3)
Chloride: 101 mmol/L (ref 98–111)
Creatinine, Ser: 0.37 mg/dL — ABNORMAL LOW (ref 0.44–1.00)
GFR, Estimated: >60 mL/min (ref >60)
Glucose, Bld: 120 mg/dL — ABNORMAL HIGH (ref 70–99)
Potassium: 4.2 mmol/L (ref 3.5–5.1)
Sodium: 132 mmol/L — ABNORMAL LOW (ref 135–145)
Total Bilirubin: 0.2 mg/dL (ref 0.0–1.2)
Total Protein: 7.2 g/dL (ref 6.5–8.1)

## 2023-11-14 LAB — MAGNESIUM: Magnesium: 1.9 mg/dL (ref 1.7–2.4)

## 2023-11-14 LAB — TRIGLYCERIDES: Triglycerides: 154 mg/dL — ABNORMAL HIGH (ref <150)

## 2023-11-14 MED ORDER — MAGNESIUM SULFATE 2 GM/50ML IV SOLN
2.0000 g | Freq: Once | INTRAVENOUS | Status: AC
  Administered 2023-11-14: 2 g via INTRAVENOUS
  Filled 2023-11-14: qty 50

## 2023-11-14 MED ORDER — MEGESTROL ACETATE 400 MG/10ML PO SUSP
600.0000 mg | Freq: Every day | ORAL | Status: DC
  Administered 2023-11-15: 600 mg via ORAL
  Filled 2023-11-14: qty 20

## 2023-11-14 MED ORDER — OXYCODONE HCL 5 MG PO TABS
5.0000 mg | ORAL_TABLET | ORAL | Status: DC | PRN
  Administered 2023-11-14 (×2): 5 mg via ORAL
  Administered 2023-11-15: 10 mg via ORAL
  Filled 2023-11-14: qty 2
  Filled 2023-11-14 (×2): qty 1

## 2023-11-14 MED ORDER — ACETAMINOPHEN 500 MG PO TABS
1000.0000 mg | ORAL_TABLET | Freq: Four times a day (QID) | ORAL | Status: DC | PRN
  Administered 2023-11-15: 1000 mg via ORAL
  Filled 2023-11-14: qty 2

## 2023-11-14 MED ORDER — MONTELUKAST SODIUM 10 MG PO TABS
10.0000 mg | ORAL_TABLET | Freq: Every day | ORAL | Status: DC
  Administered 2023-11-15: 10 mg via ORAL
  Filled 2023-11-14: qty 1

## 2023-11-14 MED ORDER — METOCLOPRAMIDE HCL 5 MG/ML IJ SOLN
5.0000 mg | Freq: Two times a day (BID) | INTRAMUSCULAR | Status: DC
  Administered 2023-11-14 – 2023-11-15 (×2): 5 mg via INTRAVENOUS
  Filled 2023-11-14 (×2): qty 2

## 2023-11-14 MED ORDER — MIDODRINE HCL 5 MG PO TABS
5.0000 mg | ORAL_TABLET | Freq: Three times a day (TID) | ORAL | Status: DC
  Administered 2023-11-14 – 2023-11-15 (×3): 5 mg via ORAL
  Filled 2023-11-14 (×3): qty 1

## 2023-11-14 MED ORDER — FLUDROCORTISONE ACETATE 0.1 MG PO TABS
0.1000 mg | ORAL_TABLET | Freq: Two times a day (BID) | ORAL | Status: DC
  Administered 2023-11-14 – 2023-11-15 (×3): 0.1 mg via ORAL
  Filled 2023-11-14 (×3): qty 1

## 2023-11-14 MED ORDER — SODIUM CHLORIDE 4 MEQ/ML IV SOLN
INTRAVENOUS | Status: DC
  Filled 2023-11-14: qty 528

## 2023-11-14 MED ORDER — ENSURE ENLIVE PO LIQD
237.0000 mL | Freq: Three times a day (TID) | ORAL | Status: DC
  Administered 2023-11-14: 237 mL via ORAL

## 2023-11-14 NOTE — Progress Notes (Signed)
 Progress Note   Patient: Stacy [name] WUJ:[id number] DOB: 01-08-1981 DOA: 10/29/2023     16 DOS: the patient was seen and examined on 11/14/2023   Brief hospital course: 43 y.o. F with hx chronic constipation, SIBO (treated years ago), underweight, iron deficiency anemia and orthostatic hypotension who presented with severe abdominal pain, CT showed perforated colon.  Status post surgery.  Consultants Critical care General Surgery  Procedures/Events 3/15 admitted by critical care 3/15 exploratory laparotomy, extended right hemicolectomy, application of ABThera wound VAC  3/17 reexploration of abdomen, cholecystectomy, ileocolonic anastomosis  Assessment and Plan: * Colon perforation (HCC) Septic shock secondary to peritonitis, diverticulitis S/p ex lap and RIGHT colectomy left in discontinuity 3/15 S/p ex lap with primary ileocolonic anastomosis and cholecystectomy  Post-operative Ileus Chronic intestinal pseudoobstruction Chronic cholecystitis Admitted by critical care, underwent exploratory laparotomy and return to ICU requiring vasopressors  Pathology from colon found perforated diverticulitis and pericolic abscess. It also suggested a fibrinopurulent serositis presumably from bowel rupture.   Since surgery, ileus very slow to resolve.  WBC trended up and underwent CT abdomen on 3/23 which showed no fluid collections, only diffuse bowel thickening, which is chronic.  Micafungin and Zosyn completed 7 days, stopped 3/24 Surgery stopping TPN, tolerating diet, bowels moving.  Hopefully home tomorrow.   Malnutrition of moderate degree Ileus Chronic idiopathic constipation Longstanding problems with constipation, abdominal pain, nausea, weight loss and underweight.  She has been evaluated by GI at [facility], at [facility], at [facility].  See notes by Dr. Charleen Kirks on 05/08/19 and Dr. Alycia Rossetti on 06/25/20. Currently follows with Dr. Bosie Clos.   Weaning TPN as per surgery    Chronic orthostatic  hypotension Chronically on midodrine and Florinef. Resume Florinef and midodrine     Normocytic anemia Iron deficiency anemia B12 deficiency ruled out.   Given IV iron here. Follow up iron level in 6 weeks.  Will avoid oral iron given her propensity for constipation.    Positive blood culture CoNS in 1/2, likely contaminant, no further work up.    Diffuse bowel thickening on CT Long-standing issue, chronic, seen on previous scans.    Non-severe (moderate) malnutrition in context of chronic illness TPN weaned.   Right kidney lesion Tiny heterogeneous lesion of the anterior midportion of the right kidney measuring 0.8 cm. This is too small to characterize by CT or MR, but remains suspicious for a small renal neoplasm. Consider multiphasic CT or MRI to further evaluate on a nonacute, outpatient basis at the resolution of current clinical presentation.      Subjective:  Getting better.  Tolerating diet.  Bowels moving.  Hopes to go home tomorrow.  Physical Exam: Vitals:   11/14/23 0632 11/14/23 0700 11/14/23 1306 11/14/23 1320  BP: 111/80  98/72 97/70  Pulse: 93  (!) 110 (!) 110  Resp: 14   18  Temp: 99.1 F (37.3 C)  97.8 F (36.6 C) 97.8 F (36.6 C)  TempSrc: Oral  Oral   SpO2: 98%  98% 99%  Weight:  55.1 kg    Height:       Physical Exam Vitals reviewed.  Constitutional:      General: She is not in acute distress.    Appearance: She is not ill-appearing or toxic-appearing.  Cardiovascular:     Rate and Rhythm: Normal rate and regular rhythm.     Heart sounds: No murmur heard. Pulmonary:     Effort: Pulmonary effort is normal. No respiratory distress.     Breath sounds:  No wheezing, rhonchi or rales.  Neurological:     Mental Status: She is alert.  Psychiatric:        Mood and Affect: Mood normal.        Behavior: Behavior normal.   Data Reviewed: Sodium slightly low at 132 AST and ALT slightly elevated, likely related to TPN, can follow-up as an  outpatient Peritoneal fluid culture no growth thus far  Family Communication: husband at bedside  Disposition: Status is: Inpatient Remains inpatient appropriate because: s/p surgery     Time spent: 20 minutes  Author: Brendia Sacks, MD 11/14/2023 2:12 PM  For on call review [email or web address].

## 2023-11-14 NOTE — Progress Notes (Signed)
 Physical Therapy Treatment Patient Details Name: Stacy [name] MRN: [id number] DOB: Nov 07, 1980 Today's Date: 11/14/2023   History of Present Illness 43 yo female presents to therapy following hospital admission on 10/29/2023 due to severe and sudden onset of abdominal pain with nausea, vomiting and diarrhea. Pt underwent an exploratory lap with extended R hemicolectomy due to perforated transferse colon and proximal mesenteric mass and placement of wound vac on 3/15 secondary to free air and pseudoobstruction. Pt hospitalization complicated secondary to hypotensive episode and decline in medical status requiring intubation with placement of NG tube. Pt returned to OR for abdominal wound closure on 3/17 and found to be septic. Pt PMH includes but is not limited to: anxiety, benign liver cycst, chronic intestinal pseudo-obstruction, and GERD.    PT Comments  Patient reports recently ambulated with RW , now in bed resting.  Patient provided with Theraband , blue and red and  instructed in UE and LE exercises. Will provide written HEP next visit for  standing and strengthening exercises within her abdominal surgery limitations.   Recommend HHPT to   improve in patient independence and endurance for ADL's.     If plan is discharge home, recommend the following: A little help with walking and/or transfers;A little help with bathing/dressing/bathroom;Assistance with cooking/housework;Assist for transportation;Help with stairs or ramp for entrance   Can travel by private vehicle        Equipment Recommendations  None recommended by PT (can borrow a rollator)    Recommendations for Other Services       Precautions / Restrictions Precautions Precautions: Fall Precaution/Restrictions Comments: abdominal binder,  TPN     Mobility  Bed Mobility Overal bed mobility: Independent             General bed mobility comments: deferred, jut ambulated with Rw and family    Transfers                         Ambulation/Gait                   Stairs             Wheelchair Mobility     Tilt Bed    Modified Rankin (Stroke Patients Only)       Balance                                            Communication    Cognition Arousal: Alert Behavior During Therapy: WFL for tasks assessed/performed, Flat affect   PT - Cognitive impairments: No apparent impairments                                Cueing    Exercises Other Exercises Other Exercises: red TB provided and instructed in strengthening for all planes. Other Exercises: BLUE TB  provided and instructed patient  for LE extension and ABD of LE    General Comments        Pertinent Vitals/Pain Pain Assessment Faces Pain Scale: Hurts even more Pain Location: abdomen Pain Descriptors / Indicators: Guarding, Grimacing    Home Living                          Prior Function  PT Goals (current goals can now be found in the care plan section) Progress towards PT goals: Progressing toward goals    Frequency    Min 2X/week      PT Plan      Co-evaluation              AM-PAC PT "6 Clicks" Mobility   Outcome Measure  Help needed turning from your back to your side while in a flat bed without using bedrails?: A Little Help needed moving from lying on your back to sitting on the side of a flat bed without using bedrails?: A Little Help needed moving to and from a bed to a chair (including a wheelchair)?: A Little Help needed standing up from a chair using your arms (e.g., wheelchair or bedside chair)?: A Little Help needed to walk in hospital room?: A Little Help needed climbing 3-5 steps with a railing? : A Little 6 Click Score: 18    End of Session Equipment Utilized During Treatment:  (Theraband red and ble) Activity Tolerance: Patient tolerated treatment well Patient left: in bed;with call bell/phone within  reach;with family/visitor present Nurse Communication: Mobility status PT Visit Diagnosis: Pain     Time: 1435-1450 PT Time Calculation (min) (ACUTE ONLY): 15 min  Charges:    $Therapeutic Exercise: 8-22 mins PT General Charges $$ ACUTE PT VISIT: 1 Visit                     Blanchard Kelch PT Acute Rehabilitation Services Office [phone] Weekend pager-[phone]    Rada Hay 11/14/2023, 3:01 PM

## 2023-11-14 NOTE — Discharge Instructions (Addendum)
 [facility]      [facility], [place] [phone]  OPEN ABDOMINAL SURGERY: POST OP INSTRUCTIONS  Always review your discharge instruction sheet given to you by the facility where your surgery was performed.  IF YOU HAVE DISABILITY OR FAMILY LEAVE FORMS, YOU MUST BRING THEM TO THE OFFICE FOR PROCESSING.  PLEASE DO NOT GIVE THEM TO YOUR DOCTOR.  A prescription for pain medication may be given to you upon discharge.  Take your pain medication as prescribed, if needed.  If narcotic pain medicine is not needed, then you may take acetaminophen (Tylenol) or ibuprofen (Advil) as needed. Take your usually prescribed medications unless otherwise directed. If you need a refill on your pain medication, please contact your pharmacy. They will contact our office to request authorization.  Prescriptions will not be filled after 5pm or on week-ends. You should follow a light diet the first few days after arrival home, such as soup and crackers, pudding, etc.unless your doctor has advised otherwise. A high-fiber, low fat diet can be resumed as tolerated.   Be sure to include lots of fluids daily. Most patients will experience some swelling and bruising on the chest and neck area.  Ice packs will help.  Swelling and bruising can take several days to resolve Most patients will experience some swelling and bruising in the area of the incision. Ice pack will help. Swelling and bruising can take several days to resolve..  It is common to experience some constipation if taking pain medication after surgery.  Increasing fluid intake and taking a stool softener will usually help or prevent this problem from occurring.  A mild laxative (Milk of Magnesia or Miralax) should be taken according to package directions if there are no bowel movements after 48 hours.  You may have steri-strips (small skin tapes) in place directly over the incision.  These strips should be left on the skin for 7-10 days.  If your surgeon used skin  glue on the incision, you may shower in 24 hours.  The glue will flake off over the next 2-3 weeks.  Any sutures or staples will be removed at the office during your follow-up visit. You may find that a light gauze bandage over your incision may keep your staples from being rubbed or pulled. You may shower and replace the bandage daily. ACTIVITIES:  You may resume regular (light) daily activities beginning the next day--such as daily self-care, walking, climbing stairs--gradually increasing activities as tolerated.  You may have sexual intercourse when it is comfortable.  Refrain from any heavy lifting or straining until approved by your doctor. You may drive when you no longer are taking prescription pain medication, you can comfortably wear a seatbelt, and you can safely maneuver your car and apply brakes Return to Work: ___________________________________ Stacy [name] should see your doctor in the office for a follow-up appointment approximately two weeks after your surgery.  Make sure that you call for this appointment within a day or two after you arrive home to insure a convenient appointment time. OTHER INSTRUCTIONS:  _____________________________________________________________ _____________________________________________________________  WHEN TO CALL YOUR DOCTOR: Fever over 101.0 Inability to urinate Nausea and/or vomiting Extreme swelling or bruising Continued bleeding from incision. Increased pain, redness, or drainage from the incision. Difficulty swallowing or breathing Muscle cramping or spasms. Numbness or tingling in hands or feet or around lips.  The clinic staff is available to answer your questions during regular business hours.  Please don't hesitate to call and ask to speak to one of  the nurses if you have concerns.  For further questions, please visit [email or web address]   MIDLINE WOUND CARE: - midline dressing to be changed twice daily - supplies: sterile saline,  gauze, scissors, ABD pads, tape  - remove dressing and all packing carefully, moistening with sterile saline as needed to avoid packing/internal dressing sticking to the wound. - clean edges of skin around the wound with water/gauze, making sure there is no tape debris or leakage left on skin that could cause skin irritation or breakdown. - dampen and clean kerlix with sterile saline and pack wound from wound base to skin level, making sure to take note of any possible areas of wound tracking, tunneling and packing appropriately. Wound can be packed loosely. Trim kerlix to size if a whole kerlix is not required. - cover wound with a dry ABD pad and secure with tape.  - change dressing as needed if leakage occurs, wound gets contaminated, or to shower. - may shower daily with wound open and following the shower the wound should be dried and a clean dressing placed.

## 2023-11-14 NOTE — Progress Notes (Addendum)
 14 Days Post-Op   Subjective/Chief Complaint: Tolerating FLD without n/v/abdominal pain. Continues to have bowel movements with last BM this am  Objective: Vital signs in last 24 hours: Temp:  [99 F (37.2 C)-99.3 F (37.4 C)] 99.1 F (37.3 C) (03/31 6213) Pulse Rate:  [93-102] 93 (03/31 0632) Resp:  [14-16] 14 (03/31 0865) BP: (111-113)/(78-83) 111/80 (03/31 7846) SpO2:  [97 %-99 %] 98 % (03/31 9629) Last BM Date :  (PTA)  Intake/Output from previous day: 03/30 0701 - 03/31 0700 In: 2343.4 [P.O.:870; I.V.:1473.4] Out: 1 [Urine:1] Intake/Output this shift: Total I/O In: 120 [P.O.:120] Out: -   Abdomen: Wound is open and clean.  No distention.  Soft. Mild TTP    Lab Results:  No results for input(s): "WBC", "HGB", "HCT", "PLT" in the last 72 hours.  BMET Recent Labs    11/13/23 0315 11/14/23 0312  NA 136 132*  K 4.7 4.2  CL 108 101  CO2 21* 22  GLUCOSE 115* 120*  BUN 37* 30*  CREATININE 0.49 0.37*  CALCIUM 8.7* 8.6*   PT/INR No results for input(s): "LABPROT", "INR" in the last 72 hours. ABG No results for input(s): "PHART", "HCO3" in the last 72 hours.  Invalid input(s): "PCO2", "PO2"  Studies/Results: No results found.   Anti-infectives: Anti-infectives (From admission, onward)    Start     Dose/Rate Route Frequency Ordered Stop   10/31/23 1100  micafungin (MYCAMINE) 100 mg in sodium chloride 0.9 % 100 mL IVPB  Status:  Discontinued        100 mg 105 mL/hr over 1 Hours Intravenous Daily 10/31/23 1031 11/07/23 1308   10/29/23 1800  piperacillin-tazobactam (ZOSYN) IVPB 3.375 g  Status:  Discontinued        3.375 g 12.5 mL/hr over 240 Minutes Intravenous Every 8 hours 10/29/23 0918 11/07/23 1308   10/29/23 0930  piperacillin-tazobactam (ZOSYN) IVPB 3.375 g        3.375 g 100 mL/hr over 30 Minutes Intravenous STAT 10/29/23 0918 10/29/23 1005   10/29/23 0615  ceFEPIme (MAXIPIME) 2 g in sodium chloride 0.9 % 100 mL IVPB        2 g 200 mL/hr over 30  Minutes Intravenous  Once 10/29/23 0610 10/29/23 0703   10/29/23 0615  metroNIDAZOLE (FLAGYL) IVPB 500 mg        500 mg 100 mL/hr over 60 Minutes Intravenous  Once 10/29/23 0610 10/29/23 0825       Assessment/Plan: s/p Procedure(s): RE-EXPLORATION OF ABDOMEN, CHOLECYSTECTOMY, ILEOCOLONIC ANASTOMOSIS (N/A) POD 16/14, ex lap with extended right colectomy for perforated transverse colon, Dr. Fredricka Bonine, 3/15, ex lap with ileocolonic anastomosis and cholecystectomy Dr. Andrey Campanile 3/17  - Path w/ diverticulitis and chronic cholecystitis/focal adenomyomatous change - Significant contamination. Abx continued through 3/24 and will monitor off now - she has been afebrile. Recheck WBC today - CT 3/28 with findings concerning for enteritis and moderate ascites. Now s/p paracentesis by IR 3/28 (5 cc blood tinged) NGTD - tolerating FLD. Advance to soft. Can dc TNA  If tolerating diet likely stable for dc home in next 24 hours  ADDENDUM: patient on motegrity at home per GI and reglan here. Will reach out to GI for recommendations regarding motility agents  FEN: soft, add ensure ID: zosyn/micafungin stopped 3/24 VTE: lovenox   LOS: 16 days    Eric Form, [facility] 11/14/2023, 9:15 AM Please see Amion for pager number during day hours 7:00am-4:30pm

## 2023-11-14 NOTE — Plan of Care (Signed)

## 2023-11-14 NOTE — Progress Notes (Signed)
 PHARMACY - TOTAL PARENTERAL NUTRITION CONSULT NOTE   Indication: Prolonged ileus  Patient Measurements: Height: 5\' 3"  (160 cm) Weight: 50.1 kg (110 lb 7.2 oz) IBW/kg (Calculated) : 52.4 TPN AdjBW (KG): 51.7 Body mass index is 19.57 kg/m.  Assessment: 18 yoF admitted on 3/15 with abdominal pain, found to have pneumoperitoneum, colon perforation, mesenteric mass, diffuse/severe peritonitis, and distended gallbladder. She is s/p OR on 3/15 and 3/17. Pharmacy is consulted to dose TPN.    Glucose / Insulin: No hx DM - CBGs remain at goal (100-150) on full-rate TPN SSI/CBGs stopped 3/23 Electrolytes:  - Na now a bit low at 132   All other electrolytes WNL, including CorrCa at 9.8 Renal: SCr <1 but slowly up-trending; BUN elevated but trending down at 30  Hepatic: (3/31) AST - up at 61 , ALT-   elevated at 164 , alk phos up to 242. Tbili WNL, Albumin remains low - Tg previously elevated to nearly 800 (suspect propofol-related) then down to 99 on 3/20 3/24 Trig up to 324,  3/27 Trig down to 189, 3/31 TGs are 154 I/O:  - PO intake 870 mL - UOP foley out, urine x 4 occurrences - 2 BMs recorded 3/29  - no drains or recent diuretics GI Imaging: - 3/15 CT a/p: pneumoperitoneum with multiple foci in ventral abdomen suggesting perforated viscus, diffuse peritonitis 3/23 CT a/p: diffuse, nonspecific infectious or inflammatory Enterocolitis, marked, diffuse peritoneal thickening and hyperenhancement throughout the abdomen and pelvis. Findings are consistent with peritonitis. 3/26 [place] Abd: trace ascites, not enough for paracentesis 3/27 KUB: ordered 3/27 DG abd: nonobstructive bowel gas pattern 3/28 CT a/p: No obstruction. Diffuse small bowel wall thickening predominantly in ileal loops, similar to prior, worrisome for infectious or inflammatory enteritis  GI Surgeries / Procedures:  - 3/15 right colectomy for perforated transverse colon with proximal mesenteric mass   - 3/17 Ileocolonic  anastomosis, cholecystectomy, and abdominal closure   Central access: PICC 3/17 TPN start date: 3/18   Nutritional Goals: Goal TPN rate is 70 mL/hr (provides 92 g of protein and 1730 kcals per day) - Per Surgery, keep K >/= 4, Mg >/= 2, Phos >/= 3  RD Assessment: Estimated Needs Total Energy Estimated Needs: 1600-1750 kcals Total Protein Estimated Needs: 85-100 grams Total Fluid Estimated Needs: >/= 1.6L  Current Nutrition:  NPO >> FLD on 3/30 AM, TPN Per surgery, may start weaning TPN off tomorrow, 3/31 - Per discussion with Carl Best, PA, still concerned about how much soft food pt will tolerate today. Therefore, instead of stopping TPN will decrease the rate down to 40 ml/hr   Plan:  Now - Magnesium 2 gr IV x1 to keep greater than 2    Decrease TPN to 40 mL/hr at 1800 Electrolytes in TPN:   Na - inc to 65 mEq/L K - 50 mEq/L Ca - 5 mEq/L Mg - 8 mEq/L Phos - 8 mmol/L Cl:Ac ratio - 1:2 Add standard MVI and trace elements to TPN Chromium remains on hold d/t national backorder DC SSI/CBGs on 3/23 MIVF per MD  (none currently) BMP, magnesium, phosphorus with AM labs  in case pt does not tolerate soft food.  Monitor TPN labs on Mon/Thurs   Adalberto Cole, PharmD, BCPS 11/14/2023 11:15 AM

## 2023-11-14 NOTE — TOC Initial Note (Addendum)
 Transition of Care [facility]) - Initial/Assessment Note    Patient Details  Name: Stacy [name] MRN: [id number] Date of Birth: 05-18-1981  [facility] CM/SW Contact:    Howell Rucks, RN Phone Number: 11/14/2023, 11:17 AM  Clinical Narrative:   Per MD documentation, progressing well,  advance to soft diet, if trolerates, possible dc home in next 24 hrs. NCM outreached to [facility] PT team to confirmed [facility] PT recommendation (PT note 3/27 pt ambulated 838ft), per Enid Cutter, PT- I think she has now progressed to walking on her own (?) sounds like she no longer need HHPT .   -3:30pm PT session completed, recommendation for [facility] PT, reports pt would like to have if if possible, attending agreeable, order entered. [facility], rep-Amy accepted, added to AVS.                   Barriers to Discharge: Continued Medical Work up   Patient Goals and CMS Choice Patient states their goals for this hospitalization and ongoing recovery are:: return home   Choice offered to / list presented to : NA [place] ownership interest in [facility].provided to::  (na)    Expected Discharge Plan and Services       Living arrangements for the past 2 months: Single Family Home                   DME Agency: NA                  Prior Living Arrangements/Services Living arrangements for the past 2 months: Single Family Home Lives with:: Relatives Patient language and need for interpreter reviewed:: Yes Do you feel safe going back to the place where you live?: Yes        Care giver support system in place?: Yes (comment)   Criminal Activity/Legal Involvement Pertinent to Current Situation/Hospitalization: No - Comment as needed  Activities of Daily Living   ADL Screening (condition at time of admission) Independently performs ADLs?: Yes (appropriate for developmental age) Is the patient deaf or have difficulty hearing?: No Does the patient have difficulty seeing, even when  wearing glasses/contacts?: No Does the patient have difficulty concentrating, remembering, or making decisions?: No  Permission Sought/Granted                  Emotional Assessment Appearance:: Appears stated age       Alcohol / Substance Use: Not Applicable Psych Involvement: No (comment)  Admission diagnosis:  Pneumoperitoneum [K66.8] Hypokalemia [E87.6] Acute abdomen [R10.0] Hemoperitoneum [K66.1] Vaginal bleeding [N93.9] Generalized abdominal pain [R10.84] Thickened small bowel [K63.9] Patient Active Problem List   Diagnosis Date Noted   Positive blood culture 11/03/2023   Ileus (HCC) 11/03/2023   Malnutrition of moderate degree 11/01/2023   Colon perforation (HCC) 10/29/2023   Irregular bleeding 03/24/2023   Adie's tonic pupil, 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   Anxiety 09/25/2020   Chronic idiopathic constipation 09/25/2020   Weight loss 09/25/2020   Adrenal insufficiency (HCC) 09/16/2020   Amenorrhea 07/21/2020   Chronic orthostatic hypotension    Hypophosphatemia    Hypomagnesemia    Bloating    Nausea and vomiting 04/28/2020   Failure to thrive in adult 04/27/2020   Physical exam 01/01/2020   Generalized abdominal pain 04/25/2019   Hyponatremia 04/25/2019   Hypokalemia 04/25/2019   Severe protein-calorie malnutrition (HCC) 04/25/2019   Gastroesophageal reflux disease 04/07/2019   Normocytic anemia 04/07/2019   Chronic intestinal pseudo-obstruction 09/05/2018   Iron deficiency  anemia 01/22/2018   Vitamin B deficiency 01/22/2018   Vitamin D deficiency 01/22/2018   Underweight 06/27/2017   Cyst of ovary 10/05/2016   Hepatic adenoma 07/27/2013   PCP:  [name], MD Pharmacy:   [facility] [phone] - [place], [place] - [address] [place] [address] [facility] [address] [place] [address] Phone: [phone] Fax: [phone]     Social Drivers of Health (SDOH) Social History: SDOH Screenings   Food Insecurity:  No Food Insecurity (10/30/2023)  Housing: Low Risk  (10/30/2023)  Transportation Needs: Patient Unable To Answer (10/30/2023)  Utilities: Patient Unable To Answer (10/30/2023)  Alcohol Screen: Low Risk  (06/09/2023)  Depression (PHQ2-9): Low Risk  (05/17/2023)  Financial Resource Strain: Low Risk  (06/09/2023)  Physical Activity: Insufficiently Active (06/09/2023)  Social Connections: Moderately Integrated (06/09/2023)  Stress: No Stress Concern Present (06/09/2023)  Tobacco Use: Low Risk  (10/30/2023)   SDOH Interventions:     Readmission Risk Interventions    11/02/2023    2:10 PM 10/30/2023    8:34 AM  Readmission Risk Prevention Plan  Transportation Screening Complete Complete  PCP or Specialist Appt within 5-7 Days Complete Complete  Home Care Screening Complete Complete  Medication Review (RN CM) Complete Complete

## 2023-11-15 DIAGNOSIS — E440 Moderate protein-calorie malnutrition: Secondary | ICD-10-CM | POA: Diagnosis not present

## 2023-11-15 DIAGNOSIS — K631 Perforation of intestine (nontraumatic): Secondary | ICD-10-CM | POA: Diagnosis not present

## 2023-11-15 DIAGNOSIS — K567 Ileus, unspecified: Secondary | ICD-10-CM | POA: Diagnosis not present

## 2023-11-15 LAB — BASIC METABOLIC PANEL WITH GFR
Anion gap: 8 (ref 5–15)
BUN: 30 mg/dL — ABNORMAL HIGH (ref 6–20)
CO2: 23 mmol/L (ref 22–32)
Calcium: 8.4 mg/dL — ABNORMAL LOW (ref 8.9–10.3)
Chloride: 101 mmol/L (ref 98–111)
Creatinine, Ser: 0.55 mg/dL (ref 0.44–1.00)
GFR, Estimated: >60 mL/min (ref >60)
Glucose, Bld: 98 mg/dL (ref 70–99)
Potassium: 3.7 mmol/L (ref 3.5–5.1)
Sodium: 132 mmol/L — ABNORMAL LOW (ref 135–145)

## 2023-11-15 LAB — BODY FLUID CULTURE W GRAM STAIN
Culture: NO GROWTH
Gram Stain: NONE SEEN

## 2023-11-15 LAB — MAGNESIUM: Magnesium: 2.1 mg/dL (ref 1.7–2.4)

## 2023-11-15 LAB — PHOSPHORUS: Phosphorus: 3.7 mg/dL (ref 2.5–4.6)

## 2023-11-15 MED ORDER — METHOCARBAMOL 750 MG PO TABS: 750.0000 mg | ORAL_TABLET | Freq: Three times a day (TID) | ORAL | 0 refills | Status: AC | PRN

## 2023-11-15 MED ORDER — MEGESTROL ACETATE 400 MG/10ML PO SUSP: 400.0000 mg | Freq: Two times a day (BID) | ORAL | Status: DC

## 2023-11-15 MED ORDER — ACETAMINOPHEN 500 MG PO TABS: 1000.0000 mg | ORAL_TABLET | Freq: Four times a day (QID) | ORAL | Status: AC | PRN

## 2023-11-15 MED ORDER — OXYCODONE HCL 5 MG PO TABS: 5.0000 mg | ORAL_TABLET | Freq: Four times a day (QID) | ORAL | 0 refills | Status: AC | PRN

## 2023-11-15 MED ORDER — METOCLOPRAMIDE HCL 5 MG/ML IJ SOLN: 5.0000 mg | Freq: Every day | INTRAMUSCULAR | Status: DC

## 2023-11-15 NOTE — Discharge Summary (Signed)
 Physician Discharge Summary   Patient: Stacy [name] MRN: [id number] DOB: 1981/04/03  Admit date:     10/29/2023  Discharge date: 11/15/23  Discharge Physician: Brendia Sacks   PCP: [name], MD   Recommendations at discharge:   * Colon perforation [facility]) Septic shock secondary to peritonitis, diverticulitis S/p ex lap and RIGHT colectomy left in discontinuity 3/15 S/p ex lap with primary ileocolonic anastomosis and cholecystectomy  Post-operative Ileus Chronic intestinal pseudoobstruction Chronic cholecystitis Now off TPN and cleared by general surgery for discharge home.  Wet-to-dry dressings twice a day with normal saline.  Follow-up with surgery as an outpatient.   Malnutrition of moderate degree Ileus Chronic idiopathic constipation Discussed with [facility] general surgery, she conferred with GI, stop Motegrity and follow-up as an outpatient.  I will send a message to her primary gastroenterologist for any further outpatient recommendations.  Follow-up with GI as an outpatient.   Right kidney lesion Tiny heterogeneous lesion of the anterior midportion of the right kidney measuring 0.8 cm. This is too small to characterize by CT or MR, but remains suspicious for a small renal neoplasm. Consider multiphasic CT or MRI to further evaluate on a nonacute, outpatient basis at the resolution of current clinical presentation.  Discharge Diagnoses: Principal Problem:   Colon perforation (HCC) Active Problems:   Normocytic anemia   Hypomagnesemia   Chronic orthostatic hypotension   Hypophosphatemia   Malnutrition of moderate degree   Positive blood culture   Ileus (HCC)  Resolved Problems:   * No resolved hospital problems. [facility] Course: 43 y.o. F with hx chronic constipation, SIBO (treated years ago), underweight, iron deficiency anemia and orthostatic hypotension who presented with severe abdominal pain, CT showed perforated colon.  Status post surgery.  Hospitalization prolonged by ileus, required TPN. Condition gradually improved.  Consultants Critical care General Surgery  Procedures/Events 3/15 admitted by critical care 3/15 exploratory laparotomy, extended right hemicolectomy, application of ABThera wound VAC  3/17 reexploration of abdomen, cholecystectomy, ileocolonic anastomosis   Assessment and Plan: * Colon perforation (HCC) Septic shock secondary to peritonitis, diverticulitis S/p ex lap and RIGHT colectomy left in discontinuity 3/15 S/p ex lap with primary ileocolonic anastomosis and cholecystectomy  Post-operative Ileus Chronic intestinal pseudoobstruction Chronic cholecystitis Admitted by critical care, underwent exploratory laparotomy and return to ICU requiring vasopressors  Pathology from colon found perforated diverticulitis and pericolic abscess. It also suggested a fibrinopurulent serositis presumably from bowel rupture.   Since surgery, ileus very slow to resolve.  WBC trended up and underwent CT abdomen on 3/23 which showed no fluid collections, only diffuse bowel thickening, which is chronic.  Micafungin and Zosyn completed 7 days, stopped 3/24 Now off TPN and cleared by general surgery for discharge home.  Wet-to-dry dressings twice a day with normal saline.  Follow-up with surgery as an outpatient.   Malnutrition of moderate degree Ileus Chronic idiopathic constipation Longstanding problems with constipation, abdominal pain, nausea, weight loss and underweight.  She has been evaluated by GI at [facility], at [facility], at [facility].  See notes by Dr. Charleen Kirks on 05/08/19 and Dr. Alycia Rossetti on 06/25/20. Currently follows with Dr. Bosie Clos.   Discussed with [facility] general surgery, she conferred with GI, stop Motegrity and follow-up as an outpatient.  I will send a message to her primary gastroenterologist for any further outpatient recommendations.  Follow-up with GI as an outpatient.    Chronic orthostatic hypotension Chronically  on midodrine and Florinef. Resume Florinef and midodrine     Normocytic anemia  Iron deficiency anemia B12 deficiency ruled out.   Given IV iron here. Follow up iron level in 6 weeks.  Will avoid oral iron given her propensity for constipation.    Positive blood culture CoNS in 1/2, likely contaminant, no further work up.    Diffuse bowel thickening on CT Long-standing issue, chronic, seen on previous scans.    Non-severe (moderate) malnutrition in context of chronic illness TPN weaned.   Right kidney lesion Tiny heterogeneous lesion of the anterior midportion of the right kidney measuring 0.8 cm. This is too small to characterize by CT or MR, but remains suspicious for a small renal neoplasm. Consider multiphasic CT or MRI to further evaluate on a nonacute, outpatient basis at the resolution of current clinical presentation.        Disposition: Home health Diet recommendation:  Regular diet DISCHARGE MEDICATION: Allergies as of 11/15/2023       Reactions   Tetracyclines & Related Swelling   Swelling in spine; required spinal tap.   Erythromycin Other (See Comments)   Swelling in spine; required spinal tap   Raspberry Rash   Sulfa Antibiotics Rash   hives   Sulfonamide Derivatives Rash   Reaction to cream        Medication List     STOP taking these medications    Motegrity 2 MG Tabs Generic drug: Prucalopride Succinate   sucralfate 1 g tablet Commonly known as: CARAFATE       TAKE these medications    acetaminophen 500 MG tablet Commonly known as: TYLENOL Take 2 tablets (1,000 mg total) by mouth every 6 (six) hours as needed for mild pain (pain score 1-3), fever or headache.   cholecalciferol 25 MCG (1000 UNIT) tablet Commonly known as: VITAMIN D3 Take 2,000 Units by mouth daily.   cyanocobalamin 1000 MCG/ML injection Commonly known as: VITAMIN B12 Inject 1,000 mcg into the muscle every 30 (thirty) days.   famotidine 20 MG tablet Commonly known  as: PEPCID TAKE 1 TABLET BY MOUTH EVERYDAY AT BEDTIME   fludrocortisone 0.1 MG tablet Commonly known as: FLORINEF TAKE 1 TABLET(0.1 MG) BY MOUTH TWICE DAILY   megestrol 625 MG/5ML suspension Commonly known as: MEGACE ES SHAKE LIQUID AND TAKE 5 ML(625 MG) BY MOUTH DAILY   methocarbamol 750 MG tablet Commonly known as: Robaxin-750 Take 1 tablet (750 mg total) by mouth every 8 (eight) hours as needed for up to 3 days for muscle spasms (pain).   midodrine 5 MG tablet Commonly known as: PROAMATINE Take 1 tablet (5 mg total) by mouth 3 (three) times daily with meals.   montelukast 10 MG tablet Commonly known as: SINGULAIR Take 1 tablet (10 mg total) by mouth daily.   multivitamin with minerals Tabs tablet Take 1 tablet by mouth daily.   oxyCODONE 5 MG immediate release tablet Commonly known as: Oxy IR/ROXICODONE Take 1 tablet (5 mg total) by mouth every 6 (six) hours as needed for up to 5 days for severe pain (pain score 7-10).   polyethylene glycol 17 g packet Commonly known as: MIRALAX / GLYCOLAX Take 17 g by mouth 3 (three) times daily. What changed:  when to take this reasons to take this   prochlorperazine 25 MG suppository Commonly known as: COMPAZINE Place 25 mg rectally every 12 (twelve) hours as needed for nausea or vomiting.   RABEprazole 20 MG tablet Commonly known as: ACIPHEX Take 1 tablet (20 mg total) by mouth daily. What changed: when to take this  Discharge Care Instructions  (From admission, onward)           Start     Ordered   11/15/23 0000  Discharge wound care:       Comments: Normal saline wet to dry dressing changes twice a day   11/15/23 1335            Follow-up Information     Gaynelle Adu, MD. Call.   Specialty: General Surgery Why: We are making a follow up appointment for you., Please call to confirm appointment time., Arrive 30 minutes early to complete check in, and bring photo ID and insurance  card. Contact information: [address] [place] [address] [phone]         [facility] Follow up.   Why: Home Health Physical Therapy Contact information: [address] [place] [address] [phone]         Charlott Rakes, MD. Schedule an appointment as soon as possible for a visit in 2 week(s).   Specialty: Gastroenterology Contact information: [address] [place] [address] [phone]                Feels better August Luz for lunch  Discharge Exam: Filed Weights   11/11/23 0500 11/13/23 0436 11/14/23 0700  Weight: 46.2 kg 50.1 kg 55.1 kg   Physical Exam Vitals reviewed.  Constitutional:      General: She is not in acute distress.    Appearance: She is not ill-appearing or toxic-appearing.  Cardiovascular:     Rate and Rhythm: Normal rate and regular rhythm.     Heart sounds: No murmur heard. Pulmonary:     Effort: Pulmonary effort is normal. No respiratory distress.     Breath sounds: No wheezing, rhonchi or rales.  Neurological:     Mental Status: She is alert.  Psychiatric:        Mood and Affect: Mood normal.        Behavior: Behavior normal.      Condition at discharge: good  The results of significant diagnostics from this hospitalization (including imaging, microbiology, ancillary and laboratory) are listed below for reference.   Imaging Studies: US Paracentesis Result Date: 11/11/2023 INDICATION: Patient with history of exploratory laparoscopy with right colectomy for perforated transverse colon on 10/29/2023, exploratory lap with ileocolonic anastomosis and cholecystectomy on 10/31/2023, trace ascites; request received for diagnostic paracentesis. EXAM: ULTRASOUND GUIDED DIAGNOSTIC PARACENTESIS MEDICATIONS: 6 mL 1% lidocaine COMPLICATIONS: None immediate. PROCEDURE: Informed written consent was obtained from the patient after a discussion of the risks,  benefits and alternatives to treatment. A timeout was performed prior to the initiation of the procedure. Initial ultrasound scanning demonstrates a trace amount of ascites within the right lower abdominal quadrant. The right lower abdomen was prepped and draped in the usual sterile fashion. 1% lidocaine was used for local anesthesia. Following this, a 19 gauge, 7-cm, Yueh catheter was introduced. An ultrasound image was saved for documentation purposes. The paracentesis was performed. The catheter was removed and a dressing was applied. The patient tolerated the procedure well without immediate post procedural complication. FINDINGS: A total of approximately 5 cc of blood-tinged fluid was removed. Sample was sent to the laboratory as requested by the clinical team. IMPRESSION: Successful ultrasound-guided diagnostic paracentesis yielding 5 cc of peritoneal fluid. Performed by: Artemio Aly Electronically Signed   By: Malachy Moan M.D.   On: 11/11/2023 17:13   CT ABDOMEN PELVIS  W CONTRAST Result Date: 11/11/2023 CLINICAL DATA:  Postoperative abdominal pain EXAM: CT ABDOMEN AND PELVIS WITH CONTRAST TECHNIQUE: Multidetector CT imaging of the abdomen and pelvis was performed using the standard protocol following bolus administration of intravenous contrast. RADIATION DOSE REDUCTION: This exam was performed according to the departmental dose-optimization program which includes automated exposure control, adjustment of the mA and/or kV according to patient size and/or use of iterative reconstruction technique. CONTRAST:  OMNIPAQUE IOHEXOL 300 MG/ML  SOLN COMPARISON:  CT abdomen and pelvis 11/07/2021. MRI of the abdomen 09/03/2016. FINDINGS: Lower chest: There is atelectasis in the lung bases. Hepatobiliary: There are 2 hypodense lesions in the liver measuring 2.0 cm in the left lobe and 1.3 cm in the right lobe. There is a hypodense lesion in the caudate measuring up to 19 mm. These were previously  characterized on MRI and unchanged. The gallbladder is surgically absent. There is no biliary ductal dilatation. Pancreas: Unremarkable. No pancreatic ductal dilatation or surrounding inflammatory changes. Spleen: Normal in size without focal abnormality. Adrenals/Urinary Tract: The kidneys and adrenal glands are within normal limits. There is a small air-fluid level in the bladder, but the bladder is otherwise within normal limits. Stomach/Bowel: Patient is status post right hemicolectomy with ileocolic anastomosis. Oral contrast is seen to the level of the distal descending colon. There are scattered air-fluid levels throughout distal small bowel. There is diffuse small bowel wall thickening predominantly in ileal loops which is similar to prior. Jejunal loops appear within normal limits. Nasogastric tube tip is in the body of the stomach. There is no evidence for pneumatosis or free air. Vascular/Lymphatic: No significant vascular findings are present. No enlarged abdominal or pelvic lymph nodes. Reproductive: Essure device is present. The uterus is within normal limits. The ovaries are nonenlarged. Other: There is a moderate amount of free fluid in the lower abdomen similar to the prior study. There is diffuse peritoneal enhancement, also unchanged. There is no focal abdominal wall hernia. Musculoskeletal: No acute findings. IMPRESSION: 1. Status post right hemicolectomy with ileocolic anastomosis. No obstruction. 2. Diffuse small bowel wall thickening predominantly in ileal loops, similar to prior, worrisome for infectious or inflammatory enteritis. 3. Stable moderate amount of ascites with diffuse peritoneal enhancement worrisome for peritonitis. 4. Small air-fluid level in the bladder, possibly related to recent instrumentation. 5. Stable hypodense lesions in the liver, previously characterized on MRI. Electronically Signed   By: Darliss Cheney M.D.   On: 11/11/2023 15:44   DG Abd 1 View Result Date:  11/10/2023 CLINICAL DATA:  [id number] Ileus [facility]) [id number] EXAM: ABDOMEN - 1 VIEW COMPARISON:  11/06/2023 FINDINGS: Enteric tube terminates within the stomach. Nonobstructive bowel gas pattern. There is some enteric contrast within the colon. No gross free intraperitoneal air on supine imaging. Cholecystectomy clips and bilateral tubal occlusion devices. IMPRESSION: Nonobstructive bowel gas pattern. Electronically Signed   By: Duanne Guess D.O.   On: 11/10/2023 12:26   US Abdomen Limited Result Date: 11/09/2023 CLINICAL DATA:  Patient with history of exploratory laparoscopy with right colectomy for perforated transverse colon on 10/29/2023, exploratory lap with ileocolonic anastomosis and cholecystectomy on 10/31/2023; ascites noted on previous imaging; received for possible paracentesis. EXAM: LIMITED ABDOMEN ULTRASOUND FOR ASCITES TECHNIQUE: Limited ultrasound survey for ascites was performed in all four abdominal quadrants. Only trace ascites was noted. COMPARISON:  CT abdomen and pelvis on 11/06/2023 FINDINGS: Trace ascites noted on limited abdominal ultrasound all 4 quadrants IMPRESSION: Trace ascites present.  Paracentesis not performed today. Performed by: Artemio Aly  Electronically Signed   By: Irish Lack M.D.   On: 11/09/2023 16:39   CT ABDOMEN PELVIS W CONTRAST Result Date: 11/06/2023 CLINICAL DATA:  Postoperative abdominal pain EXAM: CT ABDOMEN AND PELVIS WITH CONTRAST TECHNIQUE: Multidetector CT imaging of the abdomen and pelvis was performed using the standard protocol following bolus administration of intravenous contrast. RADIATION DOSE REDUCTION: This exam was performed according to the departmental dose-optimization program which includes automated exposure control, adjustment of the mA and/or kV according to patient size and/or use of iterative reconstruction technique. CONTRAST:  80mL OMNIPAQUE IOHEXOL 300 MG/ML  SOLN COMPARISON:  CT abdomen pelvis, 10/29/2023, MR abdomen 09/03/2016  FINDINGS: Lower chest: Moderate left, small right pleural effusions and associated atelectasis or consolidation, all findings increased compared to prior examination. 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 Status post cholecystectomy. Unchanged mild postoperative biliary ductal dilatation. Pancreas: Unremarkable. No pancreatic ductal dilatation or surrounding inflammatory changes. Spleen: Normal in size without significant abnormality. Adrenals/Urinary Tract: Adrenal glands are unremarkable. Unchanged tiny heterogeneous lesion of the anterior midportion of the right kidney measuring 0.8 cm (series 2, image 45). The left kidney is normal, without renal calculi, solid lesion, or hydronephrosis. B Foley catheter in the bladder. Stomach/Bowel: Stomach is within normal limits. Status post interval right hemicolectomy and ileocolic anastomosis. Diffusely thickened small bowel and colon throughout the abdomen and pelvis, similar in appearance to prior examination. Vascular/Lymphatic: No significant vascular findings are present. No enlarged abdominal or pelvic lymph nodes. Reproductive: No mass or other significant abnormality. Bilateral Essure tubal occlusion devices. Other: Status post midline laparotomy. Anasarca. Moderate volume ascites, increased compared to prior examination, with marked, diffuse peritoneal thickening and hyperenhancement throughout the abdomen and pelvis. Musculoskeletal: No acute or significant osseous findings. IMPRESSION: 1. Status post interval right hemicolectomy and ileocolic anastomosis. 2. Diffusely thickened small bowel and colon throughout the abdomen and pelvis, similar in appearance to prior examination. Findings are consistent with diffuse, nonspecific infectious  or inflammatory enterocolitis. 3. Moderate volume ascites, increased compared to prior examination, with marked, diffuse peritoneal thickening and hyperenhancement throughout the abdomen and pelvis. Findings are consistent with peritonitis. 4. Moderate left, small right pleural effusions and associated atelectasis or consolidation, all findings increased compared to prior examination. 5. Unchanged tiny heterogeneous lesion of the anterior midportion of the right kidney measuring 0.8 cm. This is too small to characterize by CT or MR, but remains suspicious for a small renal neoplasm. Consider multiphasic CT or MRI to further evaluate on a nonacute, outpatient basis at the resolution of current clinical presentation. Electronically Signed   By: Jearld Lesch M.D.   On: 11/06/2023 20:32   DG CHEST PORT 1 VIEW Result Date: 11/04/2023 CLINICAL DATA:  Rhonchi. EXAM: PORTABLE CHEST 1 VIEW COMPARISON:  10/29/2023 FINDINGS: Enteric tube tip and side-port below the diaphragm. Right upper extremity PICC tip at the atrial caval junction. Hazy bilateral basilar opacities likely pleural effusions and atelectasis/airspace disease. No pulmonary edema. No pneumothorax. No acute osseous findings. IMPRESSION: 1. Hazy bilateral basilar opacities likely pleural effusions and atelectasis/airspace disease. 2. Enteric tube and right upper extremity PICC in appropriate position. Electronically Signed   By: Narda Rutherford M.D.   On: 11/04/2023 16:31   [place] EKG SITE RITE Result Date: 10/30/2023 If Site Rite image not attached, placement could not be  confirmed due to current cardiac rhythm.  DG Abd 1 View Result Date: 10/29/2023 CLINICAL DATA:  Enteric catheter placement, abnormal CT EXAM: ABDOMEN - 1 VIEW COMPARISON:  10/29/2023 FINDINGS: Supine frontal view of the lower chest and upper abdomen was obtained. Pelvis is excluded by collimation. Enteric catheter passes below diaphragm, tip and side port projecting over the gastric body.  Unremarkable bowel gas pattern without evidence of obstruction or ileus. The bowel wall thickening seen on previous CT is again appreciated. The pneumoperitoneum identified on prior CT imaging is not well visualized on this supine exam. There is patchy consolidation at the lung bases, left greater than right. IMPRESSION: 1. Enteric catheter tip projecting over the gastric body. 2. Persistent bowel wall thickening, unchanged since CT. 3. The pneumoperitoneum seen on CT is not well visualized on this supine x-ray. 4. Patchy bibasilar consolidation, left greater than right. Electronically Signed   By: Sharlet Salina M.D.   On: 10/29/2023 17:27   Portable Chest x-ray Result Date: 10/29/2023 CLINICAL DATA:  Intubated EXAM: PORTABLE CHEST 1 VIEW COMPARISON:  10/29/2023 FINDINGS: Single frontal view of the chest demonstrates endotracheal tube overlying tracheal air column, tip 3 cm above carina. Enteric catheter passes below diaphragm, tip excluded by collimation but side port projecting over the gastric body. Cardiac silhouette is stable. Persistent patchy consolidation at the lung bases, left greater than right. No effusion or pneumothorax. No acute bony abnormalities. IMPRESSION: 1. Support devices as above. 2. Stable bibasilar consolidation, left greater than right. Electronically Signed   By: Sharlet Salina M.D.   On: 10/29/2023 17:18   DG Chest Port 1 View Result Date: 10/29/2023 CLINICAL DATA:  Sepsis.  Severe abdominal pain. EXAM: PORTABLE CHEST 1 VIEW COMPARISON:  CT AP from earlier today. FINDINGS: Normal cardiomediastinal contours. Decreased lung volumes. Asymmetric opacity within the left lower lung identified concerning for atelectasis and or pneumonia. Right lung appears clear. IMPRESSION: Asymmetric opacity within the left lower lung concerning for atelectasis and/or pneumonia. Electronically Signed   By: Signa Kell M.D.   On: 10/29/2023 07:53   CT ABDOMEN PELVIS W CONTRAST Result Date:  10/29/2023 CLINICAL DATA:  Abdominal pain after waking up in the middle of the night. EXAM: CT ABDOMEN AND PELVIS WITH CONTRAST TECHNIQUE: Multidetector CT imaging of the abdomen and pelvis was performed using the standard protocol following bolus administration of intravenous contrast. RADIATION DOSE REDUCTION: This exam was performed according to the departmental dose-optimization program which includes automated exposure control, adjustment of the mA and/or kV according to patient size and/or use of iterative reconstruction technique. CONTRAST:  OMNIPAQUE IOHEXOL 300 MG/ML  SOLN COMPARISON:  CT 04/26/2020, CT 04/25/2019, MRI 09/03/2016. FINDINGS: Lower chest: Ground-glass opacities in atelectasis noted within the lung bases, left greater than right. No pleural effusion identified Hepatobiliary: Previously characterized lesion within the posterolateral right lobe of liver is again seen measuring 1.4 cm. 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 Caudate lobe of liver hemangioma is also unchanged measuring 1.9 cm, image 23/2. 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 On the prior exam from 09/03/2016 this measured 5.6 x 4.0 cm. The gallbladder appears mildly distended. There is no wall thickening or kidney stones identified. Common bile duct is mildly increased in caliber with mild intrahepatic biliary dilatation measuring 9 mm. Pancreas: Unremarkable. No pancreatic ductal dilatation or surrounding inflammatory changes. Spleen: Normal in size without focal abnormality. Adrenals/Urinary Tract: There is a  an exophytic lesion arising off the anterolateral cortex of the left kidney measuring 0.9 cm. Signs of internal enhancement noted with Hounsfield units is I is 165, image 35/2. No additional focal kidney lesions. No nephrolithiasis or hydronephrosis. Bladder is mildly distended and there is gas within the non  dependent portion of the bladder. Stomach/Bowel: Stomach appears normal. There is bowel wall thickening/edema with mucosal enhancement involving the small and large bowel loops. This appears to be a chronic abnormality and has been reported across multiple studies. No signs of pneumatosis. The small bowel loops appear mildly prominent measuring up to 2.6 cm in diameter. The appendix is suboptimally visualized. Vascular/Lymphatic: Patent abdominal aorta and upper abdominal vascularity. No signs of abdominopelvic adenopathy. There is diffuse edema throughout the mesentery. Reproductive: Uterus and bilateral adnexa are unremarkable. Other: Signs of pneumoperitoneum identified. Multiple foci free air noted predominantly within the ventral portions of the abdomen. Etiology is indeterminate but perforated viscus cannot be excluded. Free fluid is identified within the abdomen and pelvis this measures up to 26 Hounsfield units which may reflect underlying hemoperitoneum. Musculoskeletal: Remote healed ninth lateral rib fracture. No acute or suspicious osseous findings. IMPRESSION: 1. Signs of pneumoperitoneum identified. Multiple foci of free air noted predominantly within the ventral portions of the abdomen. Etiology is indeterminate but perforated viscus cannot be excluded. 2. Free fluid is identified within the abdomen and pelvis this measures up to 26 Hounsfield units which may reflect underlying hemoperitoneum versus peritonitis. 3. There is bowel wall thickening/edema with mucosal enhancement involving the small and large bowel loops. This appears to be a chronic abnormality and has been reported across multiple studies. No signs of pneumatosis. The small bowel loops appear mildly prominent measuring up to 2.6 cm in diameter. Bowel wall thickening may be the result of chronic protein losing enteropathy, inflammatory bowel disease, or edema from hypoproteinemia. 4. There is a new exophytic lesion arising off the  anterolateral cortex of the left kidney measuring 0.9 cm. This exhibits signs of internal enhancement noted with Hounsfield units up to 165. This may reflect a small renal cell carcinoma. Consider further evaluation with nonemergent renal protocol MRI. 5. Ground-glass opacities and atelectasis noted within the lung bases, left greater than right. 6. Stable appearance of previously characterized liver lesions. 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 On the prior exam from 09/03/2016 this measured 5.6 x 4.0 cm. Critical Value/emergent results were called by telephone at the time of interpretation on 10/29/2023 at 6:38 am to provider [facility] , who verbally acknowledged these results. Electronically Signed   By: Signa Kell M.D.   On: 10/29/2023 06:38    Microbiology: Results for orders placed or performed during the hospital encounter of 10/29/23  Blood Culture (routine x 2)     Status: None   Collection Time: 10/29/23  6:29 AM   Specimen: BLOOD LEFT HAND  Result Value Ref Range Status   Specimen Description   Final    BLOOD LEFT HAND Performed at [facility], [address] [place], [place] [address]    Special Requests   Final    BOTTLES DRAWN AEROBIC AND ANAEROBIC Blood Culture results may not be optimal due to an inadequate volume of blood received in culture bottles Performed at [facility], [address], [place], [place] [address]    Culture   Final    NO GROWTH 5 DAYS Performed  at [facility], [address], [place], [place] [address]    Report Status 11/03/2023 FINAL  Final  Blood culture (routine x 2)     Status: Abnormal   Collection Time: 10/29/23  6:30 AM   Specimen: BLOOD  Result Value Ref Range Status   Specimen Description   Final    BLOOD LEFT ANTECUBITAL Performed at [facility], [address], [place], [place] [address]     Special Requests   Final    BOTTLES DRAWN AEROBIC AND ANAEROBIC Blood Culture results may not be optimal due to an inadequate volume of blood received in culture bottles Performed at [facility], [address] [place], [place] [address]    Culture  Setup Time   Final    GRAM POSITIVE COCCI AEROBIC BOTTLE ONLY CRITICAL RESULT CALLED TO, READ BACK BY AND VERIFIED WITH: PHARMD MICHELLE BELL [phone] BY J RAZZAK, MT    Culture (A)  Final    STAPHYLOCOCCUS CAPITIS THE SIGNIFICANCE OF ISOLATING THIS ORGANISM FROM A SINGLE SET OF BLOOD CULTURES WHEN MULTIPLE SETS ARE DRAWN IS UNCERTAIN. PLEASE NOTIFY THE MICROBIOLOGY DEPARTMENT WITHIN ONE WEEK IF SPECIATION AND SENSITIVITIES ARE REQUIRED. Performed at [facility], [address], [place], [place] [address]    Report Status 10/31/2023 FINAL  Final  Blood Culture ID Panel (Reflexed)     Status: Abnormal   Collection Time: 10/29/23  6:30 AM  Result Value Ref Range Status   Enterococcus faecalis NOT DETECTED NOT DETECTED Final   Enterococcus Faecium NOT DETECTED NOT DETECTED Final   Listeria monocytogenes NOT DETECTED NOT DETECTED Final   Staphylococcus species DETECTED (A) NOT DETECTED Final    Comment: CRITICAL RESULT CALLED TO, READ BACK BY AND VERIFIED WITH: PHARMD MICHELLE BELL [phone] BY J RAZZAK, MT    Staphylococcus aureus (BCID) NOT DETECTED NOT DETECTED Final   Staphylococcus epidermidis NOT DETECTED NOT DETECTED Final   Staphylococcus lugdunensis NOT DETECTED NOT DETECTED Final   Streptococcus species NOT DETECTED NOT DETECTED Final   Streptococcus agalactiae NOT DETECTED NOT DETECTED Final   Streptococcus pneumoniae NOT DETECTED NOT DETECTED Final   Streptococcus pyogenes NOT DETECTED NOT DETECTED Final   A.calcoaceticus-baumannii NOT DETECTED NOT DETECTED Final   Bacteroides fragilis NOT DETECTED NOT DETECTED Final   Enterobacterales NOT DETECTED NOT DETECTED Final   Enterobacter cloacae  complex NOT DETECTED NOT DETECTED Final   Escherichia coli NOT DETECTED NOT DETECTED Final   Klebsiella aerogenes NOT DETECTED NOT DETECTED Final   Klebsiella oxytoca NOT DETECTED NOT DETECTED Final   Klebsiella pneumoniae NOT DETECTED NOT DETECTED Final   Proteus species NOT DETECTED NOT DETECTED Final   Salmonella species NOT DETECTED NOT DETECTED Final   Serratia marcescens NOT DETECTED NOT DETECTED Final   Haemophilus influenzae NOT DETECTED NOT DETECTED Final   Neisseria meningitidis NOT DETECTED NOT DETECTED Final   Pseudomonas aeruginosa NOT DETECTED NOT DETECTED Final   Stenotrophomonas maltophilia NOT DETECTED NOT DETECTED Final   Candida albicans NOT DETECTED NOT DETECTED Final   Candida auris NOT DETECTED NOT DETECTED Final   Candida glabrata NOT DETECTED NOT DETECTED Final   Candida krusei NOT DETECTED NOT DETECTED Final   Candida parapsilosis NOT DETECTED NOT DETECTED Final   Candida tropicalis NOT DETECTED NOT DETECTED Final   Cryptococcus neoformans/gattii NOT DETECTED NOT DETECTED Final    Comment: Performed at [facility], [address] [place], [place] [address]  Resp panel by  RT-PCR (RSV, Flu A&B, Covid) Anterior Nasal Swab     Status: None   Collection Time: 10/29/23  7:25 AM   Specimen: Anterior Nasal Swab  Result Value Ref Range Status   SARS Coronavirus 2 by RT PCR NEGATIVE NEGATIVE Final    Comment: (NOTE) SARS-CoV-2 target nucleic acids are NOT DETECTED.  The SARS-CoV-2 RNA is generally detectable in upper respiratory specimens during the acute phase of infection. The lowest concentration of SARS-CoV-2 viral copies this assay can detect is 138 copies/mL. A negative result does not preclude SARS-Cov-2 infection and should not be used as the sole basis for treatment or other patient management decisions. A negative result may occur with  improper specimen collection/handling, submission of specimen other than nasopharyngeal swab, presence of viral  mutation(s) within the areas targeted by this assay, and inadequate number of viral copies(<138 copies/mL). A negative result must be combined with clinical observations, patient history, and epidemiological information. The expected result is Negative.  Fact Sheet for Patients:  BloggerCourse.com  Fact Sheet for Healthcare Providers:  SeriousBroker.it  This test is no t yet approved or cleared by the [place] FDA and  has been authorized for detection and/or diagnosis of SARS-CoV-2 by FDA under an Emergency Use Authorization (EUA). This EUA will remain  in effect (meaning this test can be used) for the duration of the COVID-19 declaration under Section 564(b)(1) of the Act, 21 U.S.C.section 360bbb-3(b)(1), unless the authorization is terminated  or revoked sooner.       Influenza A by PCR NEGATIVE NEGATIVE Final   Influenza B by PCR NEGATIVE NEGATIVE Final    Comment: (NOTE) The Xpert Xpress SARS-CoV-2/FLU/RSV plus assay is intended as an aid in the diagnosis of influenza from Nasopharyngeal swab specimens and should not be used as a sole basis for treatment. Nasal washings and aspirates are unacceptable for Xpert Xpress SARS-CoV-2/FLU/RSV testing.  Fact Sheet for Patients: BloggerCourse.com  Fact Sheet for Healthcare Providers: SeriousBroker.it  This test is not yet approved or cleared by the [place] FDA and has been authorized for detection and/or diagnosis of SARS-CoV-2 by FDA under an Emergency Use Authorization (EUA). This EUA will remain in effect (meaning this test can be used) for the duration of the COVID-19 declaration under Section 564(b)(1) of the Act, 21 U.S.C. section 360bbb-3(b)(1), unless the authorization is terminated or revoked.     Resp Syncytial Virus by PCR NEGATIVE NEGATIVE Final    Comment: (NOTE) Fact Sheet for  Patients: BloggerCourse.com  Fact Sheet for Healthcare Providers: SeriousBroker.it  This test is not yet approved or cleared by the [place] FDA and has been authorized for detection and/or diagnosis of SARS-CoV-2 by FDA under an Emergency Use Authorization (EUA). This EUA will remain in effect (meaning this test can be used) for the duration of the COVID-19 declaration under Section 564(b)(1) of the Act, 21 U.S.C. section 360bbb-3(b)(1), unless the authorization is terminated or revoked.  Performed at [facility], [address], [place], [place] [address]   Blood culture (routine x 2)     Status: None   Collection Time: 10/29/23  1:56 PM   Specimen: BLOOD LEFT ARM  Result Value Ref Range Status   Specimen Description   Final    BLOOD LEFT ARM Performed at [facility], [address], [place], [place] [address]    Special Requests   Final    BOTTLES DRAWN AEROBIC AND ANAEROBIC Blood Culture results may not be optimal due to  an inadequate volume of blood received in culture bottles Performed at [facility], [address], [place], [place] [address]    Culture   Final    NO GROWTH 5 DAYS Performed at [facility], [address] [place], [place] [address]    Report Status 11/03/2023 FINAL  Final  Blood Culture (routine x 2)     Status: None   Collection Time: 10/29/23  2:08 PM   Specimen: BLOOD LEFT ARM  Result Value Ref Range Status   Specimen Description   Final    BLOOD LEFT ARM Performed at [facility], [address], [place], [place] [address]    Special Requests   Final    BOTTLES DRAWN AEROBIC AND ANAEROBIC Blood Culture adequate volume Performed at [facility], [address], [place], [place] [address]    Culture   Final    NO GROWTH 5 DAYS Performed at [facility], [address], [place], [place] [address]    Report  Status 11/03/2023 FINAL  Final  MRSA Next Gen by PCR, Nasal     Status: None   Collection Time: 10/29/23  4:34 PM   Specimen: Nasal Mucosa; Nasal Swab  Result Value Ref Range Status   MRSA by PCR Next Gen NOT DETECTED NOT DETECTED Final    Comment: (NOTE) The GeneXpert MRSA Assay (FDA approved for NASAL specimens only), is one component of a comprehensive MRSA colonization surveillance program. It is not intended to diagnose MRSA infection nor to guide or monitor treatment for MRSA infections. Test performance is not FDA approved in patients less than 63 years old. Performed at [facility], [address], [place], [place] [address]   Body fluid culture w Gram Stain     Status: None   Collection Time: 11/11/23  4:44 PM   Specimen: PATH Cytology Peritoneal fluid  Result Value Ref Range Status   Specimen Description   Final    PERITONEAL Performed at [facility], [address] [place], [place] [address]    Special Requests   Final    NONE Performed at [facility], [address], [place], [place] [address]    Gram Stain NO WBC SEEN NO ORGANISMS SEEN   Final   Culture   Final    NO GROWTH 3 DAYS Performed at [facility], [address], [place], [place] [address]    Report Status 11/15/2023 FINAL  Final    Labs: CBC: Recent Labs  Lab 11/09/23 0405 11/10/23 0832 11/11/23 0406 11/14/23 1412  WBC 16.3* 19.1* 18.7* 13.8*  HGB 8.5* 8.4* 8.1* 8.2*  HCT 26.9* 27.2* 26.2* 27.3*  MCV 91.8 91.9 92.9 92.9  PLT 779* 904* 873* 787*   Basic Metabolic Panel: Recent Labs  Lab 11/10/23 0344 11/12/23 0344 11/13/23 0315 11/14/23 0312 11/15/23 0407  NA 134* 136 136 132* 132*  K 4.6 4.7 4.7 4.2 3.7  CL 101 105 108 101 101  CO2 25 21* 21* 22 23  GLUCOSE 122* 120* 115* 120* 98  BUN 27* 36* 37* 30* 30*  CREATININE 0.39* 0.41* 0.49 0.37* 0.55  CALCIUM 8.7* 8.9 8.7* 8.6* 8.4*  MG 2.2 2.3 2.0 1.9 2.1  PHOS 4.8*  4.3  --  3.7 3.7   Liver Function Tests: Recent Labs  Lab 11/09/23 0405 11/10/23 0344 11/14/23 0312  AST 56* 36 61*  ALT 93* 76* 164*  ALKPHOS 268*  276* 242*  BILITOT 0.5 0.3 0.2  PROT 6.4* 6.9 7.2  ALBUMIN 2.1* 2.3* 2.5*   CBG: No results for input(s): "GLUCAP" in the last 168 hours.  Discharge time spent: less than 30 minutes.  Signed: Brendia Sacks, MD [facility] 11/15/2023

## 2023-11-15 NOTE — Progress Notes (Signed)
 AVS reviewed w/ pt and husband in room - both verbalized an understanding. PICC line was Dc'd by IV/VAST RN- dressing in place - pt sat up at  1505 to dress for d/c to home. Pt will remove dressing from PICC line site on 4/1 at 1500. No  other questions at this time. Pt to lobby via w/c - home w/ husband

## 2023-11-15 NOTE — Progress Notes (Addendum)
 15 Days Post-Op   Subjective/Chief Complaint: Tolerating soft diet without n/v/abdominal pain and continues to pass flatus and Bms. Had some increased incisional pain with leg exercises with PT yesterday. Eager to go home when able  Objective: Vital signs in last 24 hours: Temp:  [97.4 F (36.3 C)-98.7 F (37.1 C)] 98.3 F (36.8 C) (04/01 0319) Pulse Rate:  [92-110] 96 (04/01 0319) Resp:  [18] 18 (04/01 0319) BP: (97-115)/(70-94) 115/81 (04/01 0319) SpO2:  [93 %-100 %] 95 % (04/01 0319) Last BM Date : 11/14/23  Intake/Output from previous day: 03/31 0701 - 04/01 0700 In: 2899.7 [P.O.:1610; I.V.:1289.7] Out: 2 [Urine:2] Intake/Output this shift: No intake/output data recorded.  Abdomen: soft, ND, appropriate TTP over incision which has bandage covering CDI    Lab Results:  Recent Labs    11/14/23 1412  WBC 13.8*  HGB 8.2*  HCT 27.3*  PLT 787*    BMET Recent Labs    11/14/23 0312 11/15/23 0407  NA 132* 132*  K 4.2 3.7  CL 101 101  CO2 22 23  GLUCOSE 120* 98  BUN 30* 30*  CREATININE 0.37* 0.55  CALCIUM 8.6* 8.4*   PT/INR No results for input(s): "LABPROT", "INR" in the last 72 hours. ABG No results for input(s): "PHART", "HCO3" in the last 72 hours.  Invalid input(s): "PCO2", "PO2"  Studies/Results: No results found.   Anti-infectives: Anti-infectives (From admission, onward)    Start     Dose/Rate Route Frequency Ordered Stop   10/31/23 1100  micafungin (MYCAMINE) 100 mg in sodium chloride 0.9 % 100 mL IVPB  Status:  Discontinued        100 mg 105 mL/hr over 1 Hours Intravenous Daily 10/31/23 1031 11/07/23 1308   10/29/23 1800  piperacillin-tazobactam (ZOSYN) IVPB 3.375 g  Status:  Discontinued        3.375 g 12.5 mL/hr over 240 Minutes Intravenous Every 8 hours 10/29/23 0918 11/07/23 1308   10/29/23 0930  piperacillin-tazobactam (ZOSYN) IVPB 3.375 g        3.375 g 100 mL/hr over 30 Minutes Intravenous STAT 10/29/23 0918 10/29/23 1005    10/29/23 0615  ceFEPIme (MAXIPIME) 2 g in sodium chloride 0.9 % 100 mL IVPB        2 g 200 mL/hr over 30 Minutes Intravenous  Once 10/29/23 0610 10/29/23 0703   10/29/23 0615  metroNIDAZOLE (FLAGYL) IVPB 500 mg        500 mg 100 mL/hr over 60 Minutes Intravenous  Once 10/29/23 0610 10/29/23 0825       Assessment/Plan: s/p Procedure(s): RE-EXPLORATION OF ABDOMEN, CHOLECYSTECTOMY, ILEOCOLONIC ANASTOMOSIS (N/A) POD 17/15, ex lap with extended right colectomy for perforated transverse colon, Dr. Fredricka Bonine, 3/15, ex lap with ileocolonic anastomosis and cholecystectomy Dr. Andrey Campanile 3/17  - Path w/ diverticulitis and chronic cholecystitis/focal adenomyomatous change - Significant contamination. Abx continued through 3/24 and will monitor off now - she has been afebrile. Recheck yesterday with good improvement to 13.8 - CT 3/28 with findings concerning for enteritis and moderate ascites. Now s/p paracentesis by IR 3/28 (5 cc blood tinged) NGTD - tolerating soft diet. Stop TPN. - patient on motegrity at home per GI and reglan here.  Addendum for GI recommendation correction: Discussed with GI yesterday and decreased reglan. Decrease again today. They do not recommend resuming motegrity on dc given recent perforation and she should follow up with GI post discharge for ongoing management of medication.   patient stable for discharge as early as today from surgical standpoint  FEN: soft, add ensure ID: zosyn/micafungin stopped 3/24 VTE: lovenox   LOS: 17 days    Eric Form, [facility] 11/15/2023, 8:05 AM Please see Amion for pager number during day hours 7:00am-4:30pm

## 2023-11-15 NOTE — Plan of Care (Signed)
 ?  Problem: Clinical Measurements: ?Goal: Will remain free from infection ?Outcome: Progressing ?  ?

## 2023-11-15 NOTE — Progress Notes (Signed)
 Wound care teaching given to husband and patient. Both verbalized understanding and able to demonstrate wound care. All questions answered and wound care supplies sent with pt.

## 2023-11-15 NOTE — Progress Notes (Addendum)
 Physical therapy note- Patient  reports  MD recommended to hold LE exercises  and continue UE exercises. Patient   was given a HEP  for standing exercises to perform in the future as she heals. Patient is  to have HHPT which will benefit her for  strength and endurance  in ADL's in her home environment. Patient hopeful for DC today. PT will sign off. [phone] 1 visit [facility]

## 2023-11-16 LAB — LIPASE, FLUID: Lipase-Fluid: 29 U/L

## 2023-11-16 LAB — TOTAL BILIRUBIN, BODY FLUID: Total bilirubin, fluid: 0.4 mg/dL

## 2023-11-22 ENCOUNTER — Telehealth: Payer: Self-pay

## 2023-11-22 NOTE — Telephone Encounter (Signed)
 Copied from CRM [id number]. Topic: Clinical - Home Health Verbal Orders >> Nov 22, 2023 12:46 PM Almira Coaster wrote: Caller/Agency: [facility] Callback Number: [phone] Service Requested: Physical Therapy Frequency: 2 times a week for 5 weeks.  Any new concerns about the patient? Yes, blood pressure is low, she has history of low blood pressure and takes fludrocortisone (FLORINEF) 0.1 MG tablet and midodrine (PROAMATINE) 5 MG tablet and they have some concerns and would like to speak to a nurse due to the readings, last reading was 71/68. Patient is not showing or feels symptoms and the cuff they're using is bit to big so they are not sure if that's the reason why they're getting these readings.

## 2023-11-22 NOTE — Telephone Encounter (Signed)
 Copied from CRM [id number]. Topic: General - Other >> Nov 22, 2023  1:52 PM Pascal Lux wrote: Reason for CRM: Patient husband stated that earlier her blood pressure was a little low and he went and got a better size cuff and the readings are normal and patient is feeling normal. Recent readings:  98/76  - 100/77 -106-78 (legs up).

## 2023-11-22 NOTE — Telephone Encounter (Signed)
 I spoke with the patient and she reports improved blood pressures once they found a small sized blood pressure cuff. No concerns, feeling well, seems like a false alarm.

## 2023-11-22 NOTE — Telephone Encounter (Signed)
 I am available to see her in the clinic this afternoon if you would like. She can come right over and I will see her if they feel safe with her in the car.

## 2023-11-23 NOTE — Telephone Encounter (Signed)
 Noted.

## 2023-11-24 ENCOUNTER — Ambulatory Visit: Payer: Self-pay | Admitting: *Deleted

## 2023-11-24 ENCOUNTER — Ambulatory Visit: Admitting: Student in an Organized Health Care Education/Training Program

## 2023-11-24 ENCOUNTER — Encounter: Payer: Self-pay | Admitting: Student in an Organized Health Care Education/Training Program

## 2023-11-24 VITALS — BP 80/62 | HR 103 | Temp 98.5°F | Wt 108.0 lb

## 2023-11-24 DIAGNOSIS — K219 Gastro-esophageal reflux disease without esophagitis: Secondary | ICD-10-CM | POA: Diagnosis not present

## 2023-11-24 DIAGNOSIS — E43 Unspecified severe protein-calorie malnutrition: Secondary | ICD-10-CM | POA: Diagnosis not present

## 2023-11-24 DIAGNOSIS — I959 Hypotension, unspecified: Secondary | ICD-10-CM

## 2023-11-24 DIAGNOSIS — T148XXA Other injury of unspecified body region, initial encounter: Secondary | ICD-10-CM

## 2023-11-24 MED ORDER — OMEPRAZOLE 40 MG PO CPDR
40.0000 mg | DELAYED_RELEASE_CAPSULE | Freq: Two times a day (BID) | ORAL | 1 refills | Status: DC
Start: 2023-11-24 — End: 2023-12-22

## 2023-11-24 NOTE — Assessment & Plan Note (Signed)
 Mid abdomen has a surgical wound that is coming together nicely, seems to be partially healing by secondary intention.  No signs of surgical wound infection.  We changed the dressing today.  She has follow-up planned with surgery clinic.

## 2023-11-24 NOTE — Assessment & Plan Note (Signed)
 Symptoms are with severe esophageal reflux.  This has been a intermittent problem for her.  Not helped by famotidine 20 mg twice daily.  Will start omeprazole 40 mg twice daily for 4 weeks.  We talked about supportive care.  Chest pain not consistent with cardiac etiology.  I reassured that she is chest pain-free on exam today.

## 2023-11-24 NOTE — Telephone Encounter (Signed)
  Chief Complaint: chest tightness with burping a lot S/P gallbladder surgery March 14 . Requesting medication  Symptoms: chest tightness "feels like acid reflux". Burping a lot. Taking Tums with minimal relief.  Frequency: yesterday  Pertinent Negatives: Patient denies chest pain now no difficulty breathing no fever no dizziness no sweating no pain left arm , jaw.  Disposition: [] ED /[] Urgent Care (no appt availability in office) / [x] Appointment(In office/virtual)/ []  [facility]/ [] Home Care/ [] Refused Recommended Disposition /[] [facility]/ []  Follow-up with PCP Additional Notes:   Appt scheduled by CAL for tomorrow.     Copied from CRM [id number]. Topic: Clinical - Red Word Triage >> Nov 24, 2023  8:05 AM Stacy [name] wrote: Red Word that prompted transfer to Nurse Triage: Real bad acid reflux. Chest tightness. Reason for Disposition  [1] Chest pain lasts < 5 minutes AND [2] NO chest pain or cardiac symptoms (e.g., breathing difficulty, sweating) now  (Exception: Chest pains that last only a few seconds.)  Answer Assessment - Initial Assessment Questions 1. LOCATION: "Where does it hurt?"       Chest tightness 2. RADIATION: "Does the pain go anywhere else?" (e.g., into neck, jaw, arms, back)     no 3. ONSET: "When did the chest pain begin?" (Minutes, hours or days)      Yesterday 4. PATTERN: "Does the pain come and go, or has it been constant since it started?"  "Does it get worse with exertion?"      Comes and goes  5. DURATION: "How long does it last" (e.g., seconds, minutes, hours)     Few seconds  6. SEVERITY: "How bad is the pain?"  (e.g., Scale 1-10; mild, moderate, or severe)    - MILD (1-3): doesn't interfere with normal activities     - MODERATE (4-7): interferes with normal activities or awakens from sleep    - SEVERE (8-10): excruciating pain, unable to do any normal activities       No pain now reports "burping" a lot  7. CARDIAC RISK FACTORS: "Do  you have any history of heart problems or risk factors for heart disease?" (e.g., angina, prior heart attack; diabetes, high blood pressure, high cholesterol, smoker, or strong family history of heart disease)     Na  8. PULMONARY RISK FACTORS: "Do you have any history of lung disease?"  (e.g., blood clots in lung, asthma, emphysema, birth control pills)     na 9. CAUSE: "What do you think is causing the chest pain?"     Acid reflux  10. OTHER SYMPTOMS: "Do you have any other symptoms?" (e.g., dizziness, nausea, vomiting, sweating, fever, difficulty breathing, cough)       Chest tightness comes and goes, s/p gallbladder removal march 14  11. PREGNANCY: "Is there any chance you are pregnant?" "When was your last menstrual period?"       na  Protocols used: Chest Pain-A-AH

## 2023-11-24 NOTE — Assessment & Plan Note (Signed)
 Weight today is 108 pounds with a BMI of 19.  This is about stable from her discharge weight 110 pounds.  Would like to see improved nutrition at home.  She cannot tolerate protein supplements, Ensure, or boost.  Will get a treat the acid reflux aggressively, and encourage good nutrition.  Thankfully she has great support from her husband.  No signs of ileus or bowel obstruction today, and the nausea has been manageable.

## 2023-11-24 NOTE — Progress Notes (Signed)
 Acute Office Visit  Subjective:     Patient ID: Stacy [name], female    DOB: 19-Sep-1980, 43 y.o.   MRN: [id number]  Chief Complaint  Patient presents with   Chest Pain    Patient states acid reflux since yesterday with constant burping. Has been feeling nauseated and diarrhea. Patient states with the acid reflux it is making her chest tight and is not wanting to eat.     HPI  Patient is in today for reflux type symptoms.  Patient recently had a 19-day hospitalization for perforated diverticulitis complicated by prolonged ileus and malnutrition.  She had 2 surgeries with partial colectomy.  She has been recovering at home but has been very deconditioned and requiring full support.  Eating and drinking has been okay at home, doing her best to tolerate bone broth and food on a daily basis.  Yesterday started to notice burning sensation in her lower chest with a bad taste in her mouth, and a sensation of nausea.  She had 1 small episode of vomiting yesterday.  She is still having bowel movements, had one yesterday and a loose bowel movement this morning.  No increase in abdominal pain or discomfort.  She has a burping sensation.  She says this is very consistent with past feelings of reflux.  No chest pain with exertion, currently she is chest pain-free in the clinic.  Husband is doing a great job changing her surgical wound every day, it is a wet to dry gauze.  PT came out to the house to evaluate her, but was not able to work with her much because she so deconditioned and had some hypotension.  That hypotension seem to be a falsely low reading because of the wrong size blood pressure cuff.      Objective:    BP (!) 80/62   Pulse (!) 103   Temp 98.5 F (36.9 C) (Temporal)   Wt 108 lb (49 kg)   SpO2 97%   BMI 19.13 kg/m    Physical Exam  Gen: Chronically ill-appearing woman Heart: Regular, no murmur Abd: 10 cm serpiginous surgical scar on her mid abdomen around the umbilicus,  no surrounding erythema, no purulence, looks to be healing well Ext: Diffusely sarcopenia, 1+ pitting edema in both lower extremities      Assessment & Plan:   Problem List Items Addressed This Visit       Unprioritized   Hypotension   History of hypotension, some of which are false with the low readings because of her frailty and will be mild.  They reports normal blood pressure readings at home when used with a small cuff.  Currently asymptomatic.  She is continuing to take midodrine 5 mg 3 times daily along with fludrocortisone 0.1 mg daily.  No symptoms of hypotension which is reassuring.      Gastroesophageal reflux disease - Primary   Symptoms are with severe esophageal reflux.  This has been a intermittent problem for her.  Not helped by famotidine 20 mg twice daily.  Will start omeprazole 40 mg twice daily for 4 weeks.  We talked about supportive care.  Chest pain not consistent with cardiac etiology.  I reassured that she is chest pain-free on exam today.      Relevant Medications   omeprazole (PRILOSEC) 40 MG capsule   Severe protein-calorie malnutrition (HCC)   Weight today is 108 pounds with a BMI of 19.  This is about stable from her discharge weight 110 pounds.  Would like to see improved nutrition at home.  She cannot tolerate protein supplements, Ensure, or boost.  Will get a treat the acid reflux aggressively, and encourage good nutrition.  Thankfully she has great support from her husband.  No signs of ileus or bowel obstruction today, and the nausea has been manageable.      Surgical wound present   Mid abdomen has a surgical wound that is coming together nicely, seems to be partially healing by secondary intention.  No signs of surgical wound infection.  We changed the dressing today.  She has follow-up planned with surgery clinic.       Meds ordered this encounter  Medications   omeprazole (PRILOSEC) 40 MG capsule    Sig: Take 1 capsule (40 mg total) by mouth in  the morning and at bedtime.    Dispense:  60 capsule    Refill:  1    Return in about 1 week (around 12/01/2023) for Wound and nutrition checks.  Tyson Alias, MD

## 2023-11-24 NOTE — Assessment & Plan Note (Signed)
 History of hypotension, some of which are false with the low readings because of her frailty and will be mild.  They reports normal blood pressure readings at home when used with a small cuff.  Currently asymptomatic.  She is continuing to take midodrine 5 mg 3 times daily along with fludrocortisone 0.1 mg daily.  No symptoms of hypotension which is reassuring.

## 2023-11-25 ENCOUNTER — Inpatient Hospital Stay: Admitting: Family Medicine

## 2023-11-29 ENCOUNTER — Telehealth: Payer: Self-pay | Admitting: Family Medicine

## 2023-11-29 NOTE — Telephone Encounter (Signed)
 Copied from CRM [id number]. Topic: Clinical - Medication Refill >> Nov 29, 2023 10:29 AM Orien Bird wrote: Most Recent Primary Care Visit:  Provider: Ether Hercules  Department: [facility]  Visit Type: OFFICE VISIT  Date: 11/24/2023  Medication: Oxycodone 5 mg   Has the patient contacted their pharmacy? No (Agent: If no, request that the patient contact the pharmacy for the refill. If patient does not wish to contact the pharmacy document the reason why and proceed with request.) (Agent: If yes, when and what did the pharmacy advise?)  Is this the correct pharmacy for this prescription? Yes If no, delete pharmacy and type the correct one.  This is the patient's preferred pharmacy:  [id number] - [place], [place] - [address] [place] [address] Phone: [phone] Fax: [phone]   Has the prescription been filled recently? No  Is the patient out of the medication? Yes  Has the patient been seen for an appointment in the last year OR does the patient have an upcoming appointment? Yes  Can we respond through MyChart? Yes  Agent: Please be advised that Rx refills may take up to 3 business days. We ask that you follow-up with your pharmacy.

## 2023-12-05 ENCOUNTER — Other Ambulatory Visit: Payer: Self-pay

## 2023-12-05 NOTE — Telephone Encounter (Signed)
 Called patient to check on oxycodone  refill, patients husband answered and stated Oxycodone  was refilled by surgeon. I asked patients husband how she is doing and checking on the pulse rate, Patients husband also stated that her resting heart rate has been 95-104 since surgery and the surgeon stated it was due to not getting enough nutrition.

## 2023-12-05 NOTE — Telephone Encounter (Signed)
 Her resting heart rate is not concerning at this time given all the physical stress she has been under.  It is her body fighting to heal itself.  Nutrition will play a big role in her recovery.  We will continue to monitor her heart rate as time goes on

## 2023-12-05 NOTE — Telephone Encounter (Signed)
 Did she ever get the refill on her Oxycodone  that was requested 6 days ago?  If not, does she still need it?

## 2023-12-06 ENCOUNTER — Telehealth: Payer: Self-pay | Admitting: Family Medicine

## 2023-12-06 ENCOUNTER — Telehealth: Payer: Self-pay

## 2023-12-06 NOTE — Telephone Encounter (Signed)
 Sent to PCP ?

## 2023-12-06 NOTE — Telephone Encounter (Signed)
 [facility] faxed  Home Health Certificate (Order [place] [id number]), to be filled out by provider. Patient requested to send it back via Fax within 5-days. Document is located in providers tray at front office.Please advise at  [phone].

## 2023-12-08 ENCOUNTER — Encounter: Payer: Self-pay | Admitting: Family Medicine

## 2023-12-08 ENCOUNTER — Ambulatory Visit: Admitting: Family Medicine

## 2023-12-08 VITALS — BP 102/62 | HR 110 | Temp 98.0°F | Ht 63.0 in | Wt 104.0 lb

## 2023-12-08 DIAGNOSIS — E539 Vitamin B deficiency, unspecified: Secondary | ICD-10-CM

## 2023-12-08 DIAGNOSIS — F5102 Adjustment insomnia: Secondary | ICD-10-CM

## 2023-12-08 DIAGNOSIS — K5989 Other specified functional intestinal disorders: Secondary | ICD-10-CM | POA: Diagnosis not present

## 2023-12-08 DIAGNOSIS — F419 Anxiety disorder, unspecified: Secondary | ICD-10-CM

## 2023-12-08 DIAGNOSIS — E43 Unspecified severe protein-calorie malnutrition: Secondary | ICD-10-CM | POA: Diagnosis not present

## 2023-12-08 DIAGNOSIS — R627 Adult failure to thrive: Secondary | ICD-10-CM

## 2023-12-08 DIAGNOSIS — D509 Iron deficiency anemia, unspecified: Secondary | ICD-10-CM

## 2023-12-08 DIAGNOSIS — K5904 Chronic idiopathic constipation: Secondary | ICD-10-CM

## 2023-12-08 DIAGNOSIS — K9189 Other postprocedural complications and disorders of digestive system: Secondary | ICD-10-CM | POA: Diagnosis not present

## 2023-12-08 DIAGNOSIS — I951 Orthostatic hypotension: Secondary | ICD-10-CM

## 2023-12-08 DIAGNOSIS — N289 Disorder of kidney and ureter, unspecified: Secondary | ICD-10-CM

## 2023-12-08 DIAGNOSIS — K219 Gastro-esophageal reflux disease without esophagitis: Secondary | ICD-10-CM

## 2023-12-08 DIAGNOSIS — G4700 Insomnia, unspecified: Secondary | ICD-10-CM | POA: Insufficient documentation

## 2023-12-08 DIAGNOSIS — E2740 Unspecified adrenocortical insufficiency: Secondary | ICD-10-CM | POA: Diagnosis not present

## 2023-12-08 DIAGNOSIS — E876 Hypokalemia: Secondary | ICD-10-CM

## 2023-12-08 MED ORDER — CYANOCOBALAMIN 1000 MCG/ML IJ SOLN
1000.0000 ug | Freq: Once | INTRAMUSCULAR | Status: AC
  Administered 2023-12-08: 1000 ug via INTRAMUSCULAR

## 2023-12-08 MED ORDER — ONDANSETRON 4 MG PO TBDP: 4.0000 mg | ORAL_TABLET | Freq: Three times a day (TID) | ORAL | 1 refills | Status: AC | PRN

## 2023-12-08 MED ORDER — FAMOTIDINE 40 MG PO TABS
40.0000 mg | ORAL_TABLET | Freq: Every day | ORAL | 0 refills | Status: DC
Start: 2023-12-08 — End: 2023-12-12

## 2023-12-08 MED ORDER — TRAZODONE HCL 50 MG PO TABS: 25.0000 mg | ORAL_TABLET | Freq: Every evening | ORAL | 3 refills | Status: AC | PRN

## 2023-12-08 NOTE — Telephone Encounter (Signed)
 Faxed and placed in scan bin

## 2023-12-08 NOTE — Patient Instructions (Signed)
 Follow up in 2 weeks to recheck weight and reflux (sooner if needed) We'll call you to schedule your GI appt at [facility] RESTART the Famotidine  daily.  This is in addition to the Omeprazole  twice daily TAKE the Trazodone  at night to help w/ sleep Titrate the Miralax  with the goal of having 1 good BM daily USE the Zofran  as needed for nausea Try and eat and drink regularly to help regain the weight Call with any questions or concerns Hang in there!!

## 2023-12-08 NOTE — Telephone Encounter (Signed)
Form signed and returned to [place]

## 2023-12-08 NOTE — Progress Notes (Signed)
   Subjective:    Patient ID: Stacy [name], female    DOB: [birth year], 43 y.o.   MRN: [id number]  HPI Vomiting- pt reports she has had a few episodes of vomiting this week.  Had diarrhea last night.  Vomiting occurs after eating.  Continues to have increased reflux and indigestion.  Currently on Omeprazole  BID.  Stopped Famotidine .  Husband is concerned she is constipated which in the past has resulted in vomiting.  Constipation risk is increased due to pain meds and muscle relaxers.  She stopped the Motegrity  while hospitalized per GI but no reason given.  Husband has been titrating Miralax  based on BM's.  Insomnia- pt is having issues w/ pain control and acid reflux at night.  Not sleeping.  Taking Tylenol  and oxycodone  but trying to use pain meds sparingly.   Review of Systems For ROS see HPI     Objective:   Physical Exam Vitals reviewed.  Constitutional:      General: She is not in acute distress.    Appearance: She is ill-appearing.  Cardiovascular:     Rate and Rhythm: Regular rhythm. Tachycardia present.     Heart sounds: Normal heart sounds.  Pulmonary:     Effort: Pulmonary effort is normal. No respiratory distress.     Breath sounds: No wheezing.  Abdominal:     General: There is distension (mild).     Palpations: Abdomen is soft.     Tenderness: There is no abdominal tenderness. There is no guarding.     Comments: Midline surgical incision w/ 1 visible suture- otherwise healing well without sign of infxn  Skin:    General: Skin is warm and dry.  Neurological:     General: No focal deficit present.     Mental Status: She is alert and oriented to person, place, and time.  Psychiatric:        Mood and Affect: Mood normal.        Behavior: Behavior normal.        Thought Content: Thought content normal.           Assessment & Plan:

## 2023-12-08 NOTE — Assessment & Plan Note (Signed)
 Injxn given

## 2023-12-08 NOTE — Assessment & Plan Note (Signed)
 Deteriorated.  She is on Omeprazole  BID.  Will restart Famotidine  in addition to PPI.  Pt expressed understanding and is in agreement w/ plan.

## 2023-12-08 NOTE — Assessment & Plan Note (Signed)
 Ongoing issue for pt.  She has had multiple GI visits and workups in the past.  Husband would like her to go back to [facility] where they had a very good experience until their physician retired.  Encouraged him to titrate Miralax  based on BM's and vomiting.  Pt expressed understanding and is in agreement w/ plan.

## 2023-12-08 NOTE — Assessment & Plan Note (Signed)
 Ongoing issue.  Pt is down 4 lbs since last visit.  Stressed the need for her to eat regularly and consume high calorie foods.  Will continue to follow.

## 2023-12-08 NOTE — Assessment & Plan Note (Signed)
 New.  Likely multifactorial- pain, GERD, anxiety.  Will attempt to get reflux under control and will add Trazodone  to improve sleep.  Pt expressed understanding and is in agreement w/ plan.

## 2023-12-12 ENCOUNTER — Telehealth: Payer: Self-pay

## 2023-12-12 MED ORDER — FAMOTIDINE 40 MG/5ML PO SUSR
40.0000 mg | Freq: Every day | ORAL | 3 refills | Status: DC
Start: 2023-12-12 — End: 2024-01-03

## 2023-12-12 NOTE — Telephone Encounter (Signed)
 Received fax from pharmacy requesting a drug change due to patient's insurance not covering the famotidine . Alternative requested is Cimetidine tab or the Famotidine  Suspension.

## 2023-12-12 NOTE — Telephone Encounter (Signed)
 Called patient to let her know this medication has been sent to the pharmacy

## 2023-12-12 NOTE — Telephone Encounter (Signed)
 Sent in prescription for Famotidine  liquid per insurance request

## 2023-12-13 ENCOUNTER — Emergency Department (HOSPITAL_BASED_OUTPATIENT_CLINIC_OR_DEPARTMENT_OTHER)

## 2023-12-13 ENCOUNTER — Ambulatory Visit: Payer: Self-pay

## 2023-12-13 ENCOUNTER — Other Ambulatory Visit: Payer: Self-pay

## 2023-12-13 ENCOUNTER — Encounter (HOSPITAL_BASED_OUTPATIENT_CLINIC_OR_DEPARTMENT_OTHER): Payer: Self-pay | Admitting: Emergency Medicine

## 2023-12-13 ENCOUNTER — Emergency Department (HOSPITAL_BASED_OUTPATIENT_CLINIC_OR_DEPARTMENT_OTHER)
Admission: EM | Admit: 2023-12-13 | Discharge: 2023-12-13 | Disposition: A | Discharge status: Home / Self Care | Attending: Emergency Medicine | Admitting: Emergency Medicine

## 2023-12-13 DIAGNOSIS — E876 Hypokalemia: Secondary | ICD-10-CM | POA: Insufficient documentation

## 2023-12-13 DIAGNOSIS — R079 Chest pain, unspecified: Secondary | ICD-10-CM | POA: Diagnosis present

## 2023-12-13 DIAGNOSIS — K219 Gastro-esophageal reflux disease without esophagitis: Secondary | ICD-10-CM | POA: Insufficient documentation

## 2023-12-13 LAB — HCG, SERUM, QUALITATIVE: Preg, Serum: NEGATIVE

## 2023-12-13 LAB — COMPREHENSIVE METABOLIC PANEL WITH GFR
ALT: 5 U/L (ref 0–44)
AST: 11 U/L — ABNORMAL LOW (ref 15–41)
Albumin: 3.6 g/dL (ref 3.5–5.0)
Alkaline Phosphatase: 117 U/L (ref 38–126)
Anion gap: 14 (ref 5–15)
BUN: 27 mg/dL — ABNORMAL HIGH (ref 6–20)
CO2: 25 mmol/L (ref 22–32)
Calcium: 8.8 mg/dL — ABNORMAL LOW (ref 8.9–10.3)
Chloride: 101 mmol/L (ref 98–111)
Creatinine, Ser: 0.74 mg/dL (ref 0.44–1.00)
GFR, Estimated: >60 mL/min (ref >60)
Glucose, Bld: 77 mg/dL (ref 70–99)
Potassium: 3.1 mmol/L — ABNORMAL LOW (ref 3.5–5.1)
Sodium: 140 mmol/L (ref 135–145)
Total Bilirubin: 0.6 mg/dL (ref 0.0–1.2)
Total Protein: 6.6 g/dL (ref 6.5–8.1)

## 2023-12-13 LAB — CBC WITH DIFFERENTIAL/PLATELET
Abs Immature Granulocytes: 0.03 10*3/uL (ref 0.00–0.07)
Basophils Absolute: 0 10*3/uL (ref 0.0–0.1)
Basophils Relative: 0 %
Eosinophils Absolute: 0.1 10*3/uL (ref 0.0–0.5)
Eosinophils Relative: 1 %
HCT: 33 % — ABNORMAL LOW (ref 36.0–46.0)
Hemoglobin: 10.5 g/dL — ABNORMAL LOW (ref 12.0–15.0)
Immature Granulocytes: 1 %
Lymphocytes Relative: 21 %
Lymphs Abs: 1.4 10*3/uL (ref 0.7–4.0)
MCH: 28.3 pg (ref 26.0–34.0)
MCHC: 31.8 g/dL (ref 30.0–36.0)
MCV: 88.9 fL (ref 80.0–100.0)
Monocytes Absolute: 0.8 10*3/uL (ref 0.1–1.0)
Monocytes Relative: 12 %
Neutro Abs: 4.3 10*3/uL (ref 1.7–7.7)
Neutrophils Relative %: 65 %
Platelets: 627 10*3/uL — ABNORMAL HIGH (ref 150–400)
RBC: 3.71 MIL/uL — ABNORMAL LOW (ref 3.87–5.11)
RDW: 15.3 % (ref 11.5–15.5)
Smear Review: INCREASED
WBC: 6.5 10*3/uL (ref 4.0–10.5)
nRBC: 0 % (ref 0.0–0.2)

## 2023-12-13 LAB — TROPONIN T, HIGH SENSITIVITY: Troponin T High Sensitivity: <15 ng/L (ref <19)

## 2023-12-13 LAB — LIPASE, BLOOD: Lipase: 15 U/L (ref 11–51)

## 2023-12-13 MED ORDER — SODIUM CHLORIDE 0.9 % IV SOLN: INTRAVENOUS | Status: DC

## 2023-12-13 MED ORDER — PANTOPRAZOLE SODIUM 40 MG IV SOLR
40.0000 mg | Freq: Once | INTRAVENOUS | Status: AC
  Administered 2023-12-13: 40 mg via INTRAVENOUS
  Filled 2023-12-13: qty 10

## 2023-12-13 MED ORDER — IOHEXOL 300 MG/ML  SOLN
100.0000 mL | Freq: Once | INTRAMUSCULAR | Status: AC | PRN
  Administered 2023-12-13: 80 mL via INTRAVENOUS

## 2023-12-13 MED ORDER — SODIUM CHLORIDE 0.9 % IV BOLUS
1000.0000 mL | Freq: Once | INTRAVENOUS | Status: AC
  Administered 2023-12-13: 1000 mL via INTRAVENOUS

## 2023-12-13 MED ORDER — PANTOPRAZOLE SODIUM 40 MG PO TBEC: 40.0000 mg | DELAYED_RELEASE_TABLET | Freq: Every day | ORAL | 1 refills | Status: AC

## 2023-12-13 MED ORDER — POTASSIUM CHLORIDE CRYS ER 20 MEQ PO TBCR: 20.0000 meq | EXTENDED_RELEASE_TABLET | Freq: Every day | ORAL | 0 refills | Status: DC

## 2023-12-13 MED ORDER — ONDANSETRON HCL 4 MG/2ML IJ SOLN
4.0000 mg | Freq: Once | INTRAMUSCULAR | Status: AC
  Administered 2023-12-13: 4 mg via INTRAVENOUS
  Filled 2023-12-13: qty 2

## 2023-12-13 NOTE — ED Provider Notes (Addendum)
  EMERGENCY DEPARTMENT AT [facility] Provider Note   CSN: [id number] Arrival date & time: 12/13/23  2841     History  Chief Complaint  Patient presents with   Chest Pain    Stacy [name] is a 43 y.o. female.  Patient status post long admission March 15 April 1.  For colon perforation resulting in septic shock right colectomy on the fifth teens and then primary ilio colonic anastomosis and gallbladder removal done later during that admission.  Patient's been having a lot of difficulty with acid reflux.  Is on liquid Pepcid  and primary care doctor is trying to get them a referral for follow-up with Dr. Honey Lusty.  Patient already had an appointment with them in June for follow-up due to the surgical procedures he is not aware that she been having a lot of reflux problems.  Patient also with nausea vomiting and diarrhea for 2 days.  Denies any blood.  Associated with a lot of burning in the chest.       Home Medications Prior to Admission medications   Medication Sig Start Date End Date Taking? Authorizing Provider  methocarbamol  (ROBAXIN ) 500 MG tablet Take 500 mg by mouth 2 (two) times daily as needed. 12/01/23 12/21/23 Yes [provider]  Vitamin D , Ergocalciferol , (DRISDOL ) 1.25 MG (50000 UNIT) CAPS capsule Take 50,000 Units by mouth once a week. 11/02/23  Yes [provider]  acetaminophen  (TYLENOL ) 500 MG tablet Take 2 tablets (1,000 mg total) by mouth every 6 (six) hours as needed for mild pain (pain score 1-3), fever or headache. 11/15/23   Elwin Hammond, PA-C  cholecalciferol  (VITAMIN D3) 25 MCG (1000 UNIT) tablet Take 2,000 Units by mouth daily.    [provider]  cyanocobalamin  (,VITAMIN B-12,) 1000 MCG/ML injection Inject 1,000 mcg into the muscle every 30 (thirty) days.    [provider]  famotidine  (PEPCID ) 40 MG/5ML suspension Take 5 mLs (40 mg total) by mouth daily. 12/12/23   Jess Morita, MD   fludrocortisone  (FLORINEF ) 0.1 MG tablet TAKE 1 TABLET(0.1 MG) BY MOUTH TWICE DAILY 07/25/23   Tabori, Katherine E, MD  megestrol  (MEGACE  ES) 625 MG/5ML suspension SHAKE LIQUID AND TAKE 5 ML(625 MG) BY MOUTH DAILY 10/24/23   Tabori, Katherine E, MD  midodrine  (PROAMATINE ) 5 MG tablet Take 1 tablet (5 mg total) by mouth 3 (three) times daily with meals. 03/17/23   Maudine Sos, MD  montelukast  (SINGULAIR ) 10 MG tablet Take 1 tablet (10 mg total) by mouth daily. 06/01/23   Tabori, Katherine E, MD  Multiple Vitamin (MULTIVITAMIN WITH MINERALS) TABS tablet Take 1 tablet by mouth daily. 05/29/20   Armenta Landau, MD  omeprazole  (PRILOSEC) 40 MG capsule Take 1 capsule (40 mg total) by mouth in the morning and at bedtime. 11/24/23   Ether Hercules, MD  ondansetron  (ZOFRAN -ODT) 4 MG disintegrating tablet Take 1 tablet (4 mg total) by mouth every 8 (eight) hours as needed for nausea or vomiting. 12/08/23   Jess Morita, MD  oxyCODONE  (OXY IR/ROXICODONE ) 5 MG immediate release tablet Take 5 mg by mouth. 12/01/23 12/16/23  [provider]  polyethylene glycol (MIRALAX  / GLYCOLAX ) 17 g packet Take 17 g by mouth 3 (three) times daily. Patient taking differently: Take 17 g by mouth 3 (three) times daily as needed for moderate constipation. 05/28/20   Armenta Landau, MD  prochlorperazine (COMPAZINE) 25 MG suppository Place 25 mg rectally every 12 (twelve) hours as needed for nausea or  vomiting.    [provider]  traZODone  (DESYREL ) 50 MG tablet Take 0.5-1 tablets (25-50 mg total) by mouth at bedtime as needed for sleep. 12/08/23   Tabori, Katherine E, MD      Allergies    Tetracyclines & related, Erythromycin, Raspberry, Sulfa antibiotics, and Sulfonamide derivatives    Review of Systems   Review of Systems  Constitutional:  Negative for chills and fever.  HENT:  Negative for ear pain and sore throat.   Eyes:  Negative for pain and visual disturbance.  Respiratory:   Negative for cough and shortness of breath.   Cardiovascular:  Positive for chest pain. Negative for palpitations.  Gastrointestinal:  Positive for abdominal pain, diarrhea, nausea and vomiting.  Genitourinary:  Negative for dysuria and hematuria.  Musculoskeletal:  Negative for arthralgias and back pain.  Skin:  Negative for color change and rash.  Neurological:  Negative for seizures and syncope.  All other systems reviewed and are negative.   Physical Exam Updated Vital Signs BP 117/79 (BP Location: Left Arm)   Pulse (!) 106   Temp 98.4 F (36.9 C) (Oral)   Resp 17   LMP 10/29/2023   SpO2 98%  Physical Exam Vitals and nursing note reviewed.  Constitutional:      General: She is not in acute distress.    Appearance: Normal appearance. She is well-developed.  HENT:     Head: Normocephalic and atraumatic.     Mouth/Throat:     Mouth: Mucous membranes are dry.  Eyes:     Extraocular Movements: Extraocular movements intact.     Conjunctiva/sclera: Conjunctivae normal.     Pupils: Pupils are equal, round, and reactive to light.  Cardiovascular:     Rate and Rhythm: Normal rate and regular rhythm.     Heart sounds: No murmur heard. Pulmonary:     Effort: Pulmonary effort is normal. No respiratory distress.     Breath sounds: Normal breath sounds.  Abdominal:     Palpations: Abdomen is soft.     Tenderness: There is no abdominal tenderness. There is no guarding.  Musculoskeletal:        General: No swelling.     Cervical back: Normal range of motion and neck supple.  Skin:    General: Skin is warm and dry.     Capillary Refill: Capillary refill takes less than 2 seconds.  Neurological:     General: No focal deficit present.     Mental Status: She is alert and oriented to person, place, and time.  Psychiatric:        Mood and Affect: Mood normal.     ED Results / Procedures / Treatments   Labs (all labs ordered are listed, but only abnormal results are  displayed) Labs Reviewed  CBC WITH DIFFERENTIAL/PLATELET - Abnormal; Notable for the following components:      Result Value   RBC 3.71 (*)    Hemoglobin 10.5 (*)    HCT 33.0 (*)    Platelets 627 (*)    All other components within normal limits  COMPREHENSIVE METABOLIC PANEL WITH GFR  LIPASE, BLOOD  TROPONIN T, HIGH SENSITIVITY    EKG EKG Interpretation Date/Time:  Tuesday December 13 2023 09:44:42 EDT Ventricular Rate:  106 PR Interval:  128 QRS Duration:  62 QT Interval:  380 QTC Calculation: 505 R Axis:   9  Text Interpretation: Sinus tachycardia Consider anterior infarct Confirmed by Abdalla Naramore [phone]) on 12/13/2023 9:52:38 AM  Radiology No results  found.  Procedures Procedures    Medications Ordered in ED Medications  sodium chloride  0.9 % bolus 1,000 mL (has no administration in time range)  0.9 %  sodium chloride  infusion (has no administration in time range)  ondansetron  (ZOFRAN ) injection 4 mg (has no administration in time range)  pantoprazole  (PROTONIX ) injection 40 mg (has no administration in time range)    ED Course/ Medical Decision Making/ A&P                                 Medical Decision Making Amount and/or Complexity of Data Reviewed Labs: ordered. Radiology: ordered.  Risk Prescription drug management.   Patient appears significantly dehydrated.  Abdomen soft no point tenderness.  Based on her recent surgical procedures.  Needs chest x-ray CT scan abdomen pelvis.  Troponins complete metabolic panel lipase and CBC and will give IV fluids antinausea medicine and IV Pepcid .  Patient's pregnancy test negative.  CBC no leukocytosis hemoglobin 10.5 platelets are 627 complete metabolic panel potassium a little low at 3.1.  Liver function test normal renal function normal.  Anion gap normal.  Lipase 15 initial troponin less than 15.  CT scan abdomen and pelvis increased diffuse wall thickening of visualized distal thoracic esophagus  consistent with esophagitis mild to moderate diffuse small bowel dilatation without definitive transition point or significant interval change suspicious for ileus but not concerning for small bowel obstruction.  Some evidence of enteritis.  Resolution of ascites since previous study no evidence of abscess.  Chest x-ray minimal bibasilar segmental atelectasis minimal pleural effusion.     Final Clinical Impression(s) / ED Diagnoses Final diagnoses:  Gastroesophageal reflux disease, unspecified whether esophagitis present    Rx / DC Orders ED Discharge Orders     None         Nicklas Barns, MD 12/13/23 1035    Nicklas Barns, MD 12/13/23 1328

## 2023-12-13 NOTE — ED Notes (Signed)
 Discharge paperwork given and verbally understood.

## 2023-12-13 NOTE — Telephone Encounter (Signed)
 Chief Complaint: chest pain Symptoms: chest pain with GERD, lightheadedness, decreased PO intake Frequency: chronic with worsening for 4 days Pertinent Negatives: Patient denies SOB Disposition: [x] ED /[] Urgent Care (no appt availability in office) / [] Appointment(In office/virtual)/ []  [facility]/ [] Home Care/ [] Refused Recommended Disposition /[]  Mobile Bus/ []  Follow-up with PCP Additional Notes: RN spoke with pt's husband. Pt has had worsening GERD with 4 days with CP after eating and drinking. Pt reports feeling like food and drink is going down the wrong pipe. Pt denies difficulty breathing. Pt vomited after eating 10 minutes ago and reports 10/10 pain at this time. Pt states it feels like a bubbling in her chest. Pt reports decreased PO intake due chest pain and reports lightheadedness. Pt reports she kept everything down yesterday but has vomited every other day this week. Pt seen in the office 4/24 and famotidine  was added to omeprazole  she was already taking. Husband states that helped at first but pain is worse. RN advised pt go to the ED. Pt agreeable and husband will take her. RN advised husband if the pt develops difficulty breathing or worsening before they arrive he should call 911. Husband verbalized understanding.    Copied from CRM [id number]. Topic: Clinical - Red Word Triage >> Dec 13, 2023  8:56 AM Orien Bird wrote: [facility] that prompted transfer to Nurse Triage: Patient husband stated that she is having a hard time eating a swallowing,stated that when she eats or drink her chest starts hurting her because she has really bad acid reflux Reason for Disposition  SEVERE chest pain  Answer Assessment - Initial Assessment Questions 1. LOCATION: "Where does it hurt?"       "It just irritates me", "she feels like it's acid reflux bubbling in her chest" 2. RADIATION: "Does the pain go anywhere else?" (e.g., into neck, jaw, arms, back)     Denies 3. ONSET: "When  did the chest pain begin?" (Minutes, hours or days)      "Anything that goes down drink or food, it hurts her" 4. PATTERN: "Does the pain come and go, or has it been constant since it started?"  "Does it get worse with exertion?"      Worse after eating or drinking, none without PO intake 5. DURATION: "How long does it last" (e.g., seconds, minutes, hours)     "This new stuff has been lasting for 10 minutes or so", reflux on and off for 2 wks 6. SEVERITY: "How bad is the pain?"  (e.g., Scale 1-10; mild, moderate, or severe)    - MILD (1-3): doesn't interfere with normal activities     - MODERATE (4-7): interferes with normal activities or awakens from sleep    - SEVERE (8-10): excruciating pain, unable to do any normal activities       "Right now it's a 10/10", ate or drank not long ago 7. CARDIAC RISK FACTORS: "Do you have any history of heart problems or risk factors for heart disease?" (e.g., angina, prior heart attack; diabetes, high blood pressure, high cholesterol, smoker, or strong family history of heart disease)     Orthostatic hypotension 8. PULMONARY RISK FACTORS: "Do you have any history of lung disease?"  (e.g., blood clots in lung, asthma, emphysema, birth control pills)     No 9. CAUSE: "What do you think is causing the chest pain?"     GERD 10. OTHER SYMPTOMS: "Do you have any other symptoms?" (e.g., dizziness, nausea, vomiting, sweating, fever, difficulty breathing, cough)  Chest pain after eating or drinking d/t GERD. Endorses vomiting 10 minutes ago. Famotidine  helped a little. One day she will sleep fine, next day not at all. Kept everything down yesterday. Periodic vomiting on a daily basis for 4 days. No SOB. Breaking out into a sweat before seeing Tabori, none this week. Denies fever or URI symptoms  Decreased PO intake - lightheadedness. Frequent urination as well as soft stools  Protocols used: Chest Pain-A-AH

## 2023-12-13 NOTE — Discharge Instructions (Addendum)
 Start taking the Protonix .  Follow-up with Dr. Honey Lusty.  Return for any new or worse symptoms.  Continue the Zofran  dissolvable every 8 hours.  Also provided a little bit of potassium supplementation.

## 2023-12-13 NOTE — ED Triage Notes (Signed)
 Pt c/o CP today, acid reflux for  "awhile" and n/v/d x 2 days.Burping relieves symptoms temp.

## 2023-12-15 ENCOUNTER — Telehealth: Payer: Self-pay

## 2023-12-15 ENCOUNTER — Other Ambulatory Visit: Payer: Self-pay | Admitting: Family Medicine

## 2023-12-15 NOTE — Telephone Encounter (Unsigned)
 Copied from CRM [id number]. Topic: Clinical - Medication Refill >> Dec 15, 2023  8:27 AM Ovid Blow wrote: Most Recent Primary Care Visit:  Provider: TABORI, KATHERINE E  Department: [facility]  Visit Type: OFFICE VISIT  Date: 12/08/2023  Medication: pantoprazole  (PROTONIX ) 40 MG tablet  Has the patient contacted their pharmacy? No (Agent: If no, request that the patient contact the pharmacy for the refill. If patient does not wish to contact the pharmacy document the reason why and proceed with request.) (Agent: If yes, when and what did the pharmacy advise?)  Is this the correct pharmacy for this prescription? Yes If no, delete pharmacy and type the correct one.  This is the patient's preferred pharmacy:  [id number] - [place], [place] - [address] [place] [address] Phone: [phone] Fax: [phone]   Has the prescription been filled recently? Yes  Is the patient out of the medication? No  Has the patient been seen for an appointment in the last year OR does the patient have an upcoming appointment? Yes  Can we respond through MyChart? Yes  Agent: Please be advised that Rx refills may take up to 3 business days. We ask that you follow-up with your pharmacy.

## 2023-12-15 NOTE — Telephone Encounter (Signed)
 Copied from CRM [id number]. Topic: General - Other >> Dec 15, 2023  2:57 PM Turkey A wrote: Reason for CRM: Gillian Lacrosse PT assistant with [facility] [phone] called said that had  Patient ED visit on 12/13/23 for Gastroesophageal Reflux and Low Potassium DC same day. Patient is still having a lot of chest pain and abdominal discomfort. Patient still has difficulty eating currently. Patient has a GI appointment on 12/21/23 and is requesting medication to help for now until her appointment. Heart rate is 111 at rest

## 2023-12-16 NOTE — Telephone Encounter (Signed)
 Appt 12/22/2023 with Dr Paulla Bossier

## 2023-12-22 ENCOUNTER — Encounter: Payer: Self-pay | Admitting: Family Medicine

## 2023-12-22 ENCOUNTER — Ambulatory Visit: Admitting: Family Medicine

## 2023-12-22 VITALS — BP 90/62 | HR 108 | Temp 98.0°F | Ht 63.0 in | Wt 92.1 lb

## 2023-12-22 DIAGNOSIS — E876 Hypokalemia: Secondary | ICD-10-CM | POA: Diagnosis not present

## 2023-12-22 DIAGNOSIS — E43 Unspecified severe protein-calorie malnutrition: Secondary | ICD-10-CM | POA: Diagnosis not present

## 2023-12-22 DIAGNOSIS — R112 Nausea with vomiting, unspecified: Secondary | ICD-10-CM

## 2023-12-22 LAB — CBC WITH DIFFERENTIAL/PLATELET
Basophils Absolute: 0 10*3/uL (ref 0.0–0.1)
Basophils Relative: 0.1 % (ref 0.0–3.0)
Eosinophils Absolute: 0.2 10*3/uL (ref 0.0–0.7)
Eosinophils Relative: 1.3 % (ref 0.0–5.0)
HCT: 27.3 % — ABNORMAL LOW (ref 36.0–46.0)
Hemoglobin: 8.7 g/dL — ABNORMAL LOW (ref 12.0–15.0)
Lymphocytes Relative: 21.8 % (ref 12.0–46.0)
Lymphs Abs: 2.8 10*3/uL (ref 0.7–4.0)
MCHC: 31.8 g/dL (ref 30.0–36.0)
MCV: 86.4 fl (ref 78.0–100.0)
Monocytes Absolute: 1.4 10*3/uL — ABNORMAL HIGH (ref 0.1–1.0)
Monocytes Relative: 10.9 % (ref 3.0–12.0)
Neutro Abs: 8.3 10*3/uL — ABNORMAL HIGH (ref 1.4–7.7)
Neutrophils Relative %: 65.9 % (ref 43.0–77.0)
Platelets: 745 10*3/uL — ABNORMAL HIGH (ref 150.0–400.0)
RBC: 3.16 Mil/uL — ABNORMAL LOW (ref 3.87–5.11)
RDW: 15.1 % (ref 11.5–15.5)
WBC: 12.6 10*3/uL — ABNORMAL HIGH (ref 4.0–10.5)

## 2023-12-22 LAB — BASIC METABOLIC PANEL WITH GFR
BUN: 30 mg/dL — ABNORMAL HIGH (ref 6–23)
CO2: 20 meq/L (ref 19–32)
Calcium: 8.6 mg/dL (ref 8.4–10.5)
Chloride: 106 meq/L (ref 96–112)
Creatinine, Ser: 0.56 mg/dL (ref 0.40–1.20)
GFR: 111.49 mL/min (ref >60.00)
Glucose, Bld: 86 mg/dL (ref 70–99)
Potassium: 4 meq/L (ref 3.5–5.1)
Sodium: 134 meq/L — ABNORMAL LOW (ref 135–145)

## 2023-12-22 LAB — HEPATIC FUNCTION PANEL
ALT: 33 U/L (ref 0–35)
AST: 14 U/L (ref 0–37)
Albumin: 2.9 g/dL — ABNORMAL LOW (ref 3.5–5.2)
Alkaline Phosphatase: 70 U/L (ref 39–117)
Bilirubin, Direct: 0.2 mg/dL (ref 0.0–0.3)
Total Bilirubin: 0.5 mg/dL (ref 0.2–1.2)
Total Protein: 6.3 g/dL (ref 6.0–8.3)

## 2023-12-22 NOTE — Progress Notes (Signed)
   Subjective:    Patient ID: Stacy [name], female    DOB: 06-02-1981, 43 y.o.   MRN: [id number]  HPI Vomiting- pt is down 12 lbs in 2 weeks.  Has EGD scheduled for tomorrow.  PPI was increased to Protonix  BID.  Vomiting is now occurring randomly.  Has vomited 5x in 24 hrs- 'all liquid'.  No relief w/ Zofran .  She is very weak, is falling.  K+ 3.1, Ca 8.8, Hgb 10.5, on 4/29   Review of Systems For ROS see HPI     Objective:   Physical Exam Vitals reviewed.  Constitutional:      Appearance: She is ill-appearing.  HENT:     Head: Normocephalic and atraumatic.  Cardiovascular:     Rate and Rhythm: Regular rhythm. Tachycardia present.  Pulmonary:     Effort: Pulmonary effort is normal. No respiratory distress.     Breath sounds: No wheezing or rhonchi.  Abdominal:     General: There is distension.     Tenderness: There is abdominal tenderness.  Skin:    General: Skin is warm and dry.     Coloration: Skin is pale.  Neurological:     Mental Status: She is alert.           Assessment & Plan:

## 2023-12-22 NOTE — Patient Instructions (Addendum)
 Follow up in 2 weeks to recheck weight We'll notify you of your lab results and make any changes if needed ASK Dr Honey Lusty about a possible PICC line under anesthesia tomorrow CALL [phone] Dr Rochele Christmas at [facility] and ask about an appt Let me know if you want to proceed w/ home health Try and make sure you are eating and drinking as regularly as possible Call with any questions or concerns Hang in there!!!

## 2023-12-23 ENCOUNTER — Inpatient Hospital Stay (HOSPITAL_COMMUNITY)
Admission: EM | Admit: 2023-12-23 | Discharge: 2024-01-03 | DRG: 871 | Disposition: A | Discharge status: Home Health | Attending: Internal Medicine | Admitting: Internal Medicine

## 2023-12-23 ENCOUNTER — Telehealth: Payer: Self-pay

## 2023-12-23 ENCOUNTER — Encounter (HOSPITAL_COMMUNITY): Payer: Self-pay | Admitting: Emergency Medicine

## 2023-12-23 ENCOUNTER — Inpatient Hospital Stay (HOSPITAL_COMMUNITY)

## 2023-12-23 ENCOUNTER — Other Ambulatory Visit: Payer: Self-pay

## 2023-12-23 ENCOUNTER — Emergency Department (HOSPITAL_COMMUNITY)

## 2023-12-23 DIAGNOSIS — K625 Hemorrhage of anus and rectum: Secondary | ICD-10-CM | POA: Diagnosis present

## 2023-12-23 DIAGNOSIS — N179 Acute kidney failure, unspecified: Secondary | ICD-10-CM | POA: Diagnosis present

## 2023-12-23 DIAGNOSIS — Z882 Allergy status to sulfonamides status: Secondary | ICD-10-CM

## 2023-12-23 DIAGNOSIS — K567 Ileus, unspecified: Secondary | ICD-10-CM | POA: Diagnosis present

## 2023-12-23 DIAGNOSIS — K5989 Other specified functional intestinal disorders: Secondary | ICD-10-CM | POA: Diagnosis not present

## 2023-12-23 DIAGNOSIS — E876 Hypokalemia: Secondary | ICD-10-CM | POA: Diagnosis present

## 2023-12-23 DIAGNOSIS — K2100 Gastro-esophageal reflux disease with esophagitis, without bleeding: Secondary | ICD-10-CM | POA: Diagnosis present

## 2023-12-23 DIAGNOSIS — R6521 Severe sepsis with septic shock: Secondary | ICD-10-CM | POA: Diagnosis present

## 2023-12-23 DIAGNOSIS — Z9049 Acquired absence of other specified parts of digestive tract: Secondary | ICD-10-CM

## 2023-12-23 DIAGNOSIS — K5904 Chronic idiopathic constipation: Secondary | ICD-10-CM | POA: Diagnosis present

## 2023-12-23 DIAGNOSIS — G4700 Insomnia, unspecified: Secondary | ICD-10-CM | POA: Diagnosis present

## 2023-12-23 DIAGNOSIS — E43 Unspecified severe protein-calorie malnutrition: Secondary | ICD-10-CM | POA: Diagnosis present

## 2023-12-23 DIAGNOSIS — R571 Hypovolemic shock: Principal | ICD-10-CM | POA: Diagnosis present

## 2023-12-23 DIAGNOSIS — Z8249 Family history of ischemic heart disease and other diseases of the circulatory system: Secondary | ICD-10-CM | POA: Diagnosis not present

## 2023-12-23 DIAGNOSIS — A419 Sepsis, unspecified organism: Principal | ICD-10-CM | POA: Diagnosis present

## 2023-12-23 DIAGNOSIS — J9601 Acute respiratory failure with hypoxia: Secondary | ICD-10-CM | POA: Diagnosis not present

## 2023-12-23 DIAGNOSIS — D509 Iron deficiency anemia, unspecified: Secondary | ICD-10-CM | POA: Diagnosis present

## 2023-12-23 DIAGNOSIS — K219 Gastro-esophageal reflux disease without esophagitis: Secondary | ICD-10-CM | POA: Diagnosis present

## 2023-12-23 DIAGNOSIS — Z87440 Personal history of urinary (tract) infections: Secondary | ICD-10-CM

## 2023-12-23 DIAGNOSIS — Z881 Allergy status to other antibiotic agents status: Secondary | ICD-10-CM

## 2023-12-23 DIAGNOSIS — R579 Shock, unspecified: Secondary | ICD-10-CM | POA: Diagnosis present

## 2023-12-23 DIAGNOSIS — I951 Orthostatic hypotension: Secondary | ICD-10-CM | POA: Diagnosis present

## 2023-12-23 DIAGNOSIS — J189 Pneumonia, unspecified organism: Secondary | ICD-10-CM | POA: Diagnosis present

## 2023-12-23 DIAGNOSIS — Z833 Family history of diabetes mellitus: Secondary | ICD-10-CM

## 2023-12-23 DIAGNOSIS — J690 Pneumonitis due to inhalation of food and vomit: Secondary | ICD-10-CM

## 2023-12-23 DIAGNOSIS — Z681 Body mass index (BMI) 19 or less, adult: Secondary | ICD-10-CM

## 2023-12-23 DIAGNOSIS — E440 Moderate protein-calorie malnutrition: Secondary | ICD-10-CM | POA: Diagnosis not present

## 2023-12-23 DIAGNOSIS — R627 Adult failure to thrive: Secondary | ICD-10-CM | POA: Diagnosis present

## 2023-12-23 DIAGNOSIS — Z91018 Allergy to other foods: Secondary | ICD-10-CM

## 2023-12-23 DIAGNOSIS — I9589 Other hypotension: Secondary | ICD-10-CM | POA: Diagnosis not present

## 2023-12-23 DIAGNOSIS — Z79899 Other long term (current) drug therapy: Secondary | ICD-10-CM | POA: Diagnosis not present

## 2023-12-23 DIAGNOSIS — Z7952 Long term (current) use of systemic steroids: Secondary | ICD-10-CM

## 2023-12-23 LAB — I-STAT VENOUS BLOOD GAS, ED
Acid-base deficit: 9 mmol/L — ABNORMAL HIGH (ref 0.0–2.0)
Bicarbonate: 15.1 mmol/L — ABNORMAL LOW (ref 20.0–28.0)
Calcium, Ion: 1.04 mmol/L — ABNORMAL LOW (ref 1.15–1.40)
HCT: 28 % — ABNORMAL LOW (ref 36.0–46.0)
Hemoglobin: 9.5 g/dL — ABNORMAL LOW (ref 12.0–15.0)
O2 Saturation: 58 %
Potassium: 4.4 mmol/L (ref 3.5–5.1)
Sodium: 135 mmol/L (ref 135–145)
TCO2: 16 mmol/L — ABNORMAL LOW (ref 22–32)
pCO2, Ven: 25.2 mmHg — ABNORMAL LOW (ref 44–60)
pH, Ven: 7.386 (ref 7.25–7.43)
pO2, Ven: 30 mmHg — CL (ref 32–45)

## 2023-12-23 LAB — COMPREHENSIVE METABOLIC PANEL WITH GFR
ALT: 29 U/L (ref 0–44)
AST: 25 U/L (ref 15–41)
Albumin: 2.3 g/dL — ABNORMAL LOW (ref 3.5–5.0)
Alkaline Phosphatase: 73 U/L (ref 38–126)
Anion gap: 12 (ref 5–15)
BUN: 50 mg/dL — ABNORMAL HIGH (ref 6–20)
CO2: 14 mmol/L — ABNORMAL LOW (ref 22–32)
Calcium: 8.3 mg/dL — ABNORMAL LOW (ref 8.9–10.3)
Chloride: 109 mmol/L (ref 98–111)
Creatinine, Ser: 1.17 mg/dL — ABNORMAL HIGH (ref 0.44–1.00)
GFR, Estimated: 59 mL/min — ABNORMAL LOW (ref >60)
Glucose, Bld: 107 mg/dL — ABNORMAL HIGH (ref 70–99)
Potassium: 5.1 mmol/L (ref 3.5–5.1)
Sodium: 135 mmol/L (ref 135–145)
Total Bilirubin: 1.6 mg/dL — ABNORMAL HIGH (ref 0.0–1.2)
Total Protein: 6.1 g/dL — ABNORMAL LOW (ref 6.5–8.1)

## 2023-12-23 LAB — PROCALCITONIN: Procalcitonin: 0.5 ng/mL

## 2023-12-23 LAB — CBC WITH DIFFERENTIAL/PLATELET
Abs Immature Granulocytes: 0 10*3/uL (ref 0.00–0.07)
Basophils Absolute: 0 10*3/uL (ref 0.0–0.1)
Basophils Relative: 0 %
Eosinophils Absolute: 0.1 10*3/uL (ref 0.0–0.5)
Eosinophils Relative: 1 %
HCT: 33 % — ABNORMAL LOW (ref 36.0–46.0)
Hemoglobin: 9.9 g/dL — ABNORMAL LOW (ref 12.0–15.0)
Lymphocytes Relative: 26 %
Lymphs Abs: 1.5 10*3/uL (ref 0.7–4.0)
MCH: 27.7 pg (ref 26.0–34.0)
MCHC: 30 g/dL (ref 30.0–36.0)
MCV: 92.2 fL (ref 80.0–100.0)
Monocytes Absolute: 0.1 10*3/uL (ref 0.1–1.0)
Monocytes Relative: 1 %
Neutro Abs: 4.2 10*3/uL (ref 1.7–7.7)
Neutrophils Relative %: 72 %
Platelets: 731 10*3/uL — ABNORMAL HIGH (ref 150–400)
RBC: 3.58 MIL/uL — ABNORMAL LOW (ref 3.87–5.11)
RDW: 15.2 % (ref 11.5–15.5)
WBC: 5.8 10*3/uL (ref 4.0–10.5)
nRBC: 0 % (ref 0.0–0.2)
nRBC: 0 /100{WBCs}

## 2023-12-23 LAB — GLUCOSE, CAPILLARY
Glucose-Capillary: 102 mg/dL — ABNORMAL HIGH (ref 70–99)
Glucose-Capillary: 108 mg/dL — ABNORMAL HIGH (ref 70–99)
Glucose-Capillary: 133 mg/dL — ABNORMAL HIGH (ref 70–99)

## 2023-12-23 LAB — I-STAT CHEM 8, ED
BUN: 47 mg/dL — ABNORMAL HIGH (ref 6–20)
Calcium, Ion: 0.85 mmol/L — CL (ref 1.15–1.40)
Chloride: 113 mmol/L — ABNORMAL HIGH (ref 98–111)
Creatinine, Ser: 1 mg/dL (ref 0.44–1.00)
Glucose, Bld: 103 mg/dL — ABNORMAL HIGH (ref 70–99)
HCT: 33 % — ABNORMAL LOW (ref 36.0–46.0)
Hemoglobin: 11.2 g/dL — ABNORMAL LOW (ref 12.0–15.0)
Potassium: 4.7 mmol/L (ref 3.5–5.1)
Sodium: 132 mmol/L — ABNORMAL LOW (ref 135–145)
TCO2: 15 mmol/L — ABNORMAL LOW (ref 22–32)

## 2023-12-23 LAB — TROPONIN I (HIGH SENSITIVITY): Troponin I (High Sensitivity): <2 ng/L (ref <18)

## 2023-12-23 LAB — PROTIME-INR
INR: 1.5 — ABNORMAL HIGH (ref 0.8–1.2)
Prothrombin Time: 18.5 s — ABNORMAL HIGH (ref 11.4–15.2)

## 2023-12-23 LAB — PHOSPHORUS: Phosphorus: 6.4 mg/dL — ABNORMAL HIGH (ref 2.5–4.6)

## 2023-12-23 LAB — I-STAT CG4 LACTIC ACID, ED: Lactic Acid, Venous: 1.5 mmol/L (ref 0.5–1.9)

## 2023-12-23 LAB — HCG, SERUM, QUALITATIVE: Preg, Serum: NEGATIVE

## 2023-12-23 LAB — HIV ANTIBODY (ROUTINE TESTING W REFLEX): HIV Screen 4th Generation wRfx: NONREACTIVE

## 2023-12-23 LAB — MAGNESIUM: Magnesium: 2.5 mg/dL — ABNORMAL HIGH (ref 1.7–2.4)

## 2023-12-23 LAB — MRSA NEXT GEN BY PCR, NASAL: MRSA by PCR Next Gen: NOT DETECTED

## 2023-12-23 MED ORDER — LACTATED RINGERS IV SOLN: INTRAVENOUS | Status: AC

## 2023-12-23 MED ORDER — SODIUM CHLORIDE 0.9 % IV BOLUS
1000.0000 mL | Freq: Once | INTRAVENOUS | Status: AC
  Administered 2023-12-23: 1000 mL via INTRAVENOUS

## 2023-12-23 MED ORDER — PANTOPRAZOLE SODIUM 40 MG IV SOLR
40.0000 mg | Freq: Two times a day (BID) | INTRAVENOUS | Status: DC
  Administered 2023-12-23: 40 mg via INTRAVENOUS
  Filled 2023-12-23: qty 10

## 2023-12-23 MED ORDER — METOCLOPRAMIDE HCL 5 MG/ML IJ SOLN
10.0000 mg | Freq: Once | INTRAMUSCULAR | Status: AC
  Administered 2023-12-23: 10 mg via INTRAVENOUS
  Filled 2023-12-23: qty 2

## 2023-12-23 MED ORDER — VANCOMYCIN HCL IN DEXTROSE 1-5 GM/200ML-% IV SOLN
1000.0000 mg | Freq: Once | INTRAVENOUS | Status: AC
  Administered 2023-12-23: 1000 mg via INTRAVENOUS
  Filled 2023-12-23: qty 200

## 2023-12-23 MED ORDER — METOCLOPRAMIDE HCL 5 MG/ML IJ SOLN
5.0000 mg | Freq: Three times a day (TID) | INTRAMUSCULAR | Status: DC
Start: 2023-12-23 — End: 2023-12-25
  Administered 2023-12-23 – 2023-12-25 (×6): 5 mg via INTRAVENOUS
  Filled 2023-12-23 (×6): qty 2

## 2023-12-23 MED ORDER — POLYETHYLENE GLYCOL 3350 17 G PO PACK: 17.0000 g | PACK | Freq: Every day | ORAL | Status: DC | PRN

## 2023-12-23 MED ORDER — ORAL CARE MOUTH RINSE: 15.0000 mL | OROMUCOSAL | Status: DC | PRN

## 2023-12-23 MED ORDER — NOREPINEPHRINE 4 MG/250ML-% IV SOLN
INTRAVENOUS | Status: AC
  Administered 2023-12-23: 2 ug/min via INTRAVENOUS
  Filled 2023-12-23: qty 250

## 2023-12-23 MED ORDER — DOCUSATE SODIUM 100 MG PO CAPS: 100.0000 mg | ORAL_CAPSULE | Freq: Two times a day (BID) | ORAL | Status: DC | PRN

## 2023-12-23 MED ORDER — SODIUM CHLORIDE 0.9 % IV SOLN
3.0000 g | Freq: Four times a day (QID) | INTRAVENOUS | Status: AC
  Administered 2023-12-23 – 2024-01-01 (×37): 3 g via INTRAVENOUS
  Filled 2023-12-23 (×37): qty 8

## 2023-12-23 MED ORDER — IPRATROPIUM-ALBUTEROL 0.5-2.5 (3) MG/3ML IN SOLN
3.0000 mL | Freq: Four times a day (QID) | RESPIRATORY_TRACT | Status: DC
  Administered 2023-12-23 – 2023-12-24 (×2): 3 mL via RESPIRATORY_TRACT
  Filled 2023-12-23 (×2): qty 3

## 2023-12-23 MED ORDER — INSULIN ASPART 100 UNIT/ML IJ SOLN
1.0000 [IU] | INTRAMUSCULAR | Status: DC
  Administered 2023-12-24: 1 [IU] via SUBCUTANEOUS

## 2023-12-23 MED ORDER — DIPHENHYDRAMINE HCL 50 MG/ML IJ SOLN
25.0000 mg | Freq: Once | INTRAMUSCULAR | Status: AC
  Administered 2023-12-23: 25 mg via INTRAVENOUS
  Filled 2023-12-23: qty 1

## 2023-12-23 MED ORDER — FENTANYL CITRATE PF 50 MCG/ML IJ SOSY
25.0000 ug | PREFILLED_SYRINGE | INTRAMUSCULAR | Status: DC | PRN
  Administered 2023-12-23: 25 ug via INTRAVENOUS
  Administered 2023-12-24 (×2): 50 ug via INTRAVENOUS
  Administered 2023-12-24: 25 ug via INTRAVENOUS
  Administered 2023-12-25 – 2023-12-26 (×12): 50 ug via INTRAVENOUS
  Filled 2023-12-23 (×17): qty 1

## 2023-12-23 MED ORDER — SODIUM CHLORIDE 0.9 % IV SOLN: 3.0000 g | Freq: Four times a day (QID) | INTRAVENOUS | Status: DC

## 2023-12-23 MED ORDER — ONDANSETRON HCL 4 MG/2ML IJ SOLN
4.0000 mg | Freq: Four times a day (QID) | INTRAMUSCULAR | Status: AC | PRN
  Administered 2023-12-24 (×2): 4 mg via INTRAVENOUS
  Filled 2023-12-23 (×3): qty 2

## 2023-12-23 MED ORDER — SODIUM CHLORIDE 0.9% FLUSH
10.0000 mL | Freq: Two times a day (BID) | INTRAVENOUS | Status: DC
  Administered 2023-12-23: 30 mL
  Administered 2023-12-24: 10 mL
  Administered 2023-12-24: 30 mL
  Administered 2023-12-25: 10 mL

## 2023-12-23 MED ORDER — NOREPINEPHRINE 4 MG/250ML-% IV SOLN
0.0000 ug/min | INTRAVENOUS | Status: DC
Start: 2023-12-23 — End: 2023-12-26
  Administered 2023-12-24: 4.5 ug/min via INTRAVENOUS
  Administered 2023-12-24: 2 ug/min via INTRAVENOUS
  Filled 2023-12-23 (×2): qty 250

## 2023-12-23 MED ORDER — PIPERACILLIN-TAZOBACTAM 3.375 G IVPB 30 MIN
3.3750 g | Freq: Once | INTRAVENOUS | Status: AC
  Administered 2023-12-23: 3.375 g via INTRAVENOUS
  Filled 2023-12-23: qty 50

## 2023-12-23 MED ORDER — IRON DEXTRAN 50 MG/ML IJ SOLN: 25.0000 mg | Freq: Every day | INTRAMUSCULAR | Status: DC

## 2023-12-23 MED ORDER — SODIUM CHLORIDE 0.9 % IV SOLN
100.0000 mg | Freq: Once | INTRAVENOUS | Status: AC
  Administered 2023-12-23: 100 mg via INTRAVENOUS
  Filled 2023-12-23: qty 5

## 2023-12-23 MED ORDER — CALCIUM GLUCONATE-NACL 1-0.675 GM/50ML-% IV SOLN
1.0000 g | Freq: Once | INTRAVENOUS | Status: AC
  Administered 2023-12-23: 1000 mg via INTRAVENOUS
  Filled 2023-12-23: qty 50

## 2023-12-23 MED ORDER — SODIUM CHLORIDE 0.9% FLUSH: 10.0000 mL | INTRAVENOUS | Status: DC | PRN

## 2023-12-23 MED ORDER — PANTOPRAZOLE SODIUM 40 MG IV SOLR
40.0000 mg | Freq: Once | INTRAVENOUS | Status: AC
  Administered 2023-12-23: 40 mg via INTRAVENOUS
  Filled 2023-12-23: qty 10

## 2023-12-23 NOTE — ED Provider Notes (Signed)
 H/O perf bowel left open. Complications since with FTT. Post endoscopy hypotensive, tachycardic. Being seen by Intensivist now. Will need Chest, abdo, pelvis CT. Physical Exam  BP (!) 73/44 (BP Location: Right Leg)   Pulse (!) 130   Temp 98.1 F (36.7 C) (Axillary)   Resp (!) 23   Ht 5\' 3"  (1.6 m)   Wt 43.8 kg   LMP 10/29/2023   SpO2 91%   BMI 17.11 kg/m   Physical Exam  Procedures  Procedures  ED Course / MDM    Medical Decision Making Amount and/or Complexity of Data Reviewed Labs: ordered. Radiology: ordered.  Risk Prescription drug management. Decision regarding hospitalization.   Patient admitted to ICU service.  Did not require intervention or supportive care from me.       Wynetta Heckle, MD 12/31/23 [phone]

## 2023-12-23 NOTE — Progress Notes (Signed)
 PICC order received. Discussed with Raymar, ED RN: PICC will be placed after BP is stabilized. NP made aware.

## 2023-12-23 NOTE — ED Notes (Signed)
 Critical care at bedside

## 2023-12-23 NOTE — ED Notes (Signed)
 DO Floyd at bedside.  Pt extremely hard stick.  Unable to get second set of blood cultures.

## 2023-12-23 NOTE — Progress Notes (Signed)
 eLink Physician-Brief Progress Note Patient Name: Stacy [name] DOB: 1980-09-21 MRN: [id number]   Date of Service  12/23/2023  HPI/Events of Note  KUB reviewed.  eICU Interventions  NG tube in good position in the stomach.        Shari Daughters Monty Mccarrell 12/23/2023, 8:06 PM

## 2023-12-23 NOTE — Procedures (Signed)
 Central Venous Catheter Insertion Procedure Note  Stacy [name]  [id number]  [birth year]  Date:12/23/23  Time:5:54 PM   Provider Performing:Saahas Hidrogo D. Harris   Procedure: Insertion of Non-tunneled Central Venous [phone]) with US  guidance (56213)   Indication(s) Medication administration and Difficult access  Consent Risks of the procedure as well as the alternatives and risks of each were explained to the patient and/or caregiver.  Consent for the procedure was obtained and is signed in the bedside chart  Anesthesia Topical only with 1% lidocaine    Timeout Verified patient identification, verified procedure, site/side was marked, verified correct patient position, special equipment/implants available, medications/allergies/relevant history reviewed, required imaging and test results available.  Sterile Technique Maximal sterile technique including full sterile barrier drape, hand hygiene, sterile gown, sterile gloves, mask, hair covering, sterile ultrasound probe cover (if used).  Procedure Description Area of catheter insertion was cleaned with chlorhexidine  and draped in sterile fashion.  With real-time ultrasound guidance a central venous catheter was placed into the right femoral vein. Nonpulsatile blood flow and easy flushing noted in all ports.  The catheter was sutured in place and sterile dressing applied.  Complications/Tolerance None; patient tolerated the procedure well. Chest X-ray is ordered to verify placement for internal jugular or subclavian cannulation.   Chest x-ray is not ordered for femoral cannulation.  EBL Minimal  Specimen(s) None  Stacy [name] D. Harris, NP-C [facility] Personal contact information can be found on Amion  If no contact or response made please call [phone] 12/23/2023, 5:55 PM

## 2023-12-23 NOTE — Telephone Encounter (Signed)
-----   Message from Laymon Priest sent at 12/22/2023  5:43 PM EDT ----- Thankfully potassium is normal!  Liver functions look good!  Your white blood cell count is again elevated- which is suspicious for infection (hopefully will be seen/addressed on endoscopy)  Your Hemoglobin (blood count) is again dropping.  Not as low as when you were in the hospital, but again, this would point to slow bleeding/oozing from the GI tract- definitely something to talk w/ Dr Honey Lusty about tomorrow

## 2023-12-23 NOTE — Progress Notes (Signed)
 PHARMACY - TOTAL PARENTERAL NUTRITION CONSULT NOTE   Indication: Prolonged ileus  Patient Measurements: Height: 5\' 3"  (160 cm) Weight: 43.8 kg (96 lb 9.6 oz) IBW/kg (Calculated) : 52.4 TPN AdjBW (KG): 43.8 Body mass index is 17.11 kg/m. Usual Weight: ~110 lbs  Assessment:  43 yo W with nausea and vomiting for 2 weeks with at least 12 lbs weight loss due to poor PO intake. In March 2025, patient had colon perforation, pericolic abscess, diverticulitis, peritonitis, and septic shock s/p ex lap and right colectomy followed by ileocolic anastomosis and cholecystectomy.  Patient required TPN 3/18- 3/31 due to post-op ileus. CT on 4/29 was suspicious for ileus. Patient had an outpatient EGD 12/23/23 complicated by aspiration and hypotension. Work up for N/V ongoing. Pharmacy consulted for TPN.   GI imaging: 4/29 CT: Mild-to-moderate diffuse small bowel dilatation, without definite transition point or significant interval change, suspicious for ileus. 5/9 KUB: dilated small bowel  GI procedures: none this admit   Central access: CVC 5/9  TPN start date: 5/10   Nutritional Goals: Goal TPN rate is 60 mL/hr (55g/L AA, 15% dextrose , 32 g/L lipids) provides 79 g of protein and 1511 kcals per day  RD Assessment: pending   March 2025 Estimated Needs (weight was ~110lbs then) Total Energy Estimated Needs: 1600-1750 kcals Total Protein Estimated Needs: 85-100 grams Total Fluid Estimated Needs: >/= 1.6L  Current Nutrition:  NPO  Plan:  TPN consult received after hours. Will start 5/10   Dorene Gang, PharmD, BCPS, [facility] Clinical Pharmacist  Please check AMION for all [facility] phone numbers After 10:00 PM, call Main Pharmacy [phone]

## 2023-12-23 NOTE — Telephone Encounter (Signed)
 Pt has reviewed via MyChart

## 2023-12-23 NOTE — ED Triage Notes (Signed)
 Pt BIB [facility] from endoscopy place due to hypotension and aspiration of stomach contents. Pt needed a emergent endoscopy as they wanted to view and see if there was a blockage.  22g right hand.  250ml NS given at endoscopy place.   VS BP 76/80

## 2023-12-23 NOTE — Progress Notes (Signed)
 Pharmacy Antibiotic Note  Stacy [name] is a 43 y.o. female admitted on 12/23/2023 with pneumonia.  Pharmacy has been consulted for Unasyn dosing.  Plan: Unasyn 3g Q6H.  Follow culture data for de-escalation.  Monitor renal function for dose adjustments as indicated.   Height: 5\' 3"  (160 cm) Weight: 43.8 kg (96 lb 9.6 oz) IBW/kg (Calculated) : 52.4  Temp (24hrs), Avg:98.1 F (36.7 C), Min:98.1 F (36.7 C), Max:98.1 F (36.7 C)  Recent Labs  Lab 12/22/23 1048 12/23/23 1441 12/23/23 1442 12/23/23 1447  WBC 12.6*  --   --  5.8  CREATININE 0.56 1.00  --   --   LATICACIDVEN  --   --  1.5  --     Estimated Creatinine Clearance: 50.2 mL/min (by C-G formula based on SCr of 1 mg/dL).    Allergies  Allergen Reactions   Tetracyclines & Related Swelling    Swelling in spine; required spinal tap.    Erythromycin Other (See Comments)    Swelling in spine; required spinal tap   Raspberry Rash   Sulfa Antibiotics Rash    hives   Sulfonamide Derivatives Rash    Reaction to cream    Antimicrobials this admission: Unasyn 5/9 >>  S/p 1x Zosyn  + vanco in ED    Microbiology results: 5/9 BCx:  5/9 MRSA PCR:   Thank you for allowing pharmacy to be a part of this patient's care.  Mamie Searles, PharmD, BCCCP  12/23/2023 4:29 PM

## 2023-12-23 NOTE — H&P (Addendum)
 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 She was sent to ED for eval. Currently, she is on NRM 15 L, SpO2 94%, having chills, SOB, tachycardic, and tachypneic. She has N/V for 2 weeks and underwent eval in ED one 10 days ago with CT abd/pelvis:  Increased diffuse wall thickening of visualized distal thoracic esophagus, consistent with esophagitis. Mild-to-moderate diffuse small bowel dilatation, without definite transition point or significant interval change, suspicious for ileus. Mild improvement in diffuse small bowel wall thickening, suggesting improving enteritis. No evidence of abscess.  Her last BM was 4 days ago, soft. Denied f/c/r before EGD. Denied abd pain or worsening distension. Compliant with meds. No alcohol drinking, smoking, or illicit drug use. She gained after discharge but in the last 2 weeks, she has poor intake.   In October 29, 2023, she has colon perforation, pericolic abscess, diverticulitis, septic shock due to peritonitis, s/p ex lap and right colectomy, followed by ex lap with primary ileocolonic anastomosis and cholecystectomy. She developed post-operative Ileus, and she was placed on TPN for almost 10 days  before resuming oral intake.      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 rate (!) 23, height 5\' 3"  (1.6 m), weight 43.8 kg, last menstrual period 10/29/2023, SpO2 91%.       No intake or output data in the 24 hours ending 12/23/23 1507 Filed Weights   12/23/23 1415  Weight: 43.8 kg    Examination: General: alert, oriented x4, and mild resp distress. NRM 15 L, SpO2 94%  HENT: PERL, normal pharynx and oral mucosa. No LNE or thyromegaly. No JVD Lungs: symmetrical air entry bilaterally. Left crackles. No wheezing Cardiovascular: NL S1/S2. No m/g/r Abdomen: mild distension. No tenderness Extremities: no edema. Symmetrical  Neuro: nonfocal    Resolved Hospital Problem list     Assessment & Plan:  Aspiration PNA leading to acute hypoxic resp failure Septic shock vs chronic orthostatic hypotension (on Florinef  & Midodrine ) -Admit to ICU -Unasyn -MRSA screening -Sputum Cx -BCx2 -PCT -Duoneb  -I/S -CVC/a-line -Levophed  for MAP >65 mmHg -Echo   HypoCa -IV Ca -Am labs  AKI  -IVF -Avoid nephrotoxic agents -Serial labs -I/O chart  GERD/esophagitis (on PPI/H2B) -Continue PPI  bid  Fe def anemia Moderate PCM -IV Fe -TPN: dietitian consult  -Glycemic control -Vit B12 and folate  Chronic idiopathic constipation/chronic intestinal pseudoobstruction  -NGT -Reglan    -Consult GI surgery   Best Practice (right click and "Reselect all SmartList Selections" daily)   Diet/type: NPO DVT prophylaxis prophylactic heparin  Pressure ulcer(s): N/A GI prophylaxis: PPI Lines: Central line Foley:  N/A Code Status:  full code Last date of multidisciplinary goals of care discussion []   Labs   CBC: Recent Labs  Lab 12/22/23 1048 12/23/23 1441 12/23/23 1449  WBC 12.6*  --   --   NEUTROABS 8.3*  --   --   HGB 8.7* 11.2* 9.5*  HCT 27.3* 33.0* 28.0*  MCV 86.4  --   --   PLT 745.0*  --   --     Basic Metabolic Panel: Recent Labs  Lab 12/22/23 1048 12/23/23 1441 12/23/23 1449  NA 134* 132* 135  K 4.0 4.7 4.4  CL 106 113*  --   CO2 20  --   --   GLUCOSE 86 103*  --   BUN 30* 47*  --   CREATININE 0.56 1.00  --   CALCIUM  8.6  --   --    GFR: Estimated Creatinine Clearance: 50.2 mL/min (by C-G formula based on SCr of 1 mg/dL). Recent Labs  Lab 12/22/23 1048 12/23/23 1442  WBC 12.6*  --   LATICACIDVEN  --  1.5    Liver Function Tests: Recent Labs  Lab 12/22/23 1048  AST 14  ALT 33  ALKPHOS 70  BILITOT 0.5  PROT 6.3  ALBUMIN  2.9*   No results for input(s): "LIPASE", "AMYLASE" in the last 168 hours. No results for input(s): "AMMONIA" in the last 168 hours.  ABG    Component Value Date/Time   PHART 7.35 10/29/2023 1828   PCO2ART 45 10/29/2023 1828   PO2ART 275 (H) 10/29/2023 1828   HCO3 15.1 (L) 12/23/2023 1449   TCO2 16 (L) 12/23/2023 1449   ACIDBASEDEF 9.0 (H) 12/23/2023 1449   O2SAT 58 12/23/2023 1449     Coagulation Profile: No results for input(s): "INR", "PROTIME" in the last 168 hours.  Cardiac Enzymes: No results for input(s): "CKTOTAL", "CKMB", "CKMBINDEX", "TROPONINI" in the last 168 hours.  HbA1C: Hgb A1c MFr Bld  Date/Time Value Ref Range Status  07/28/2013 04:30 AM 5.1 <5.7 % Final    Comment:    (NOTE)                                                                       According to the ADA Clinical  Practice Recommendations for 2011, when HbA1c is used as a screening test:  >=6.5%   Diagnostic of Diabetes Mellitus           (if abnormal result is confirmed) 5.7-6.4%   Increased risk of developing Diabetes Mellitus References:Diagnosis and Classification of Diabetes Mellitus,Diabetes Care,2011,34(Suppl 1):S62-S69 and Standards of Medical Care in         Diabetes - 2011,Diabetes Care,2011,34 (Suppl 1):S11-S61.    CBG: No results for input(s): "GLUCAP" in the last 168 hours.  Review of Systems:   Review of Systems  Constitutional:  Positive for chills, malaise/fatigue and weight loss. Negative  for diaphoresis and fever.  HENT:  Positive for sore throat. Negative for congestion, nosebleeds and sinus pain.   Respiratory:  Positive for cough, sputum production and shortness of breath. Negative for hemoptysis, wheezing and stridor.   Cardiovascular:  Negative for chest pain, palpitations, orthopnea, claudication, leg swelling and PND.  Gastrointestinal:  Positive for abdominal pain, constipation, heartburn, nausea and vomiting. Negative for blood in stool, diarrhea and melena.  Genitourinary:  Negative for dysuria, frequency and urgency.  Musculoskeletal:  Negative for myalgias.  Skin:  Negative for itching and rash.     Past Medical History:  She,  has a past medical history of Anxiety, Benign liver cyst, Chronic intestinal pseudo-obstruction, GERD (gastroesophageal reflux disease), History of UTI, and Nausea and vomiting (04/28/2020).   Surgical History:   Past Surgical History:  Procedure Laterality Date   BIOPSY  05/20/2020   Procedure: BIOPSY;  Surgeon: Celedonio Coil, MD;  Location: [facility];  Service: Endoscopy;;   CESAREAN SECTION     twice 2010, 2013   CHOLECYSTECTOMY     ESOPHAGOGASTRODUODENOSCOPY (EGD) WITH PROPOFOL  N/A 05/20/2020   Procedure: ESOPHAGOGASTRODUODENOSCOPY (EGD) WITH PROPOFOL ;  Surgeon: Celedonio Coil, MD;  Location: [facility];  Service: Endoscopy;   Laterality: N/A;   Esure  ~2015   LAPAROTOMY N/A 10/29/2023   Procedure: LAPAROTOMY, EXPLORATORY; RIGHT EXTENDED COLECTOMY;  Surgeon: Adalberto Acton, MD;  Location: [facility];  Service: General;  Laterality: N/A;   LAPAROTOMY N/A 10/31/2023   Procedure: RE-EXPLORATION OF ABDOMEN, CHOLECYSTECTOMY, ILEOCOLONIC ANASTOMOSIS;  Surgeon: Aldean Hummingbird, MD;  Location: [facility];  Service: General;  Laterality: N/A;     Social History:   reports that she has never smoked. She has never used smokeless tobacco. She reports that she does not drink alcohol and does not use drugs.   Family History:  Her family history includes CAD in her father; Diabetes in her brother and father; Hypertension in her father. There is no history of Colon cancer, Breast cancer, or Adrenal disorder.   Allergies Allergies  Allergen Reactions   Tetracyclines & Related Swelling    Swelling in spine; required spinal tap.    Erythromycin Other (See Comments)    Swelling in spine; required spinal tap   Raspberry Rash   Sulfa Antibiotics Rash    hives   Sulfonamide Derivatives Rash    Reaction to cream     Home Medications  Prior to Admission medications   Medication Sig Start Date End Date Taking? Authorizing Provider  acetaminophen  (TYLENOL ) 500 MG tablet Take 2 tablets (1,000 mg total) by mouth every 6 (six) hours as needed for mild pain (pain score 1-3), fever or headache. 11/15/23  Yes Elwin Hammond, PA-C  Vitamin D , Ergocalciferol , (DRISDOL ) 1.25 MG (50000 UNIT) CAPS capsule Take 50,000 Units by mouth once a week. 11/02/23  Yes [provider]  cyanocobalamin  (,VITAMIN B-12,) 1000 MCG/ML injection Inject 1,000 mcg into the muscle every 30 (thirty) days.    [provider]  famotidine  (PEPCID ) 40 MG/5ML suspension Take 5 mLs (40 mg total) by mouth daily. 12/12/23   Jess Morita, MD  fludrocortisone  (FLORINEF ) 0.1 MG tablet TAKE 1 TABLET(0.1 MG) BY MOUTH TWICE DAILY 07/25/23   Tabori, Katherine E,  MD  megestrol  (MEGACE  ES) 625 MG/5ML suspension SHAKE LIQUID AND TAKE 5 ML(625 MG) BY MOUTH DAILY 10/24/23   Tabori, Katherine E, MD  midodrine  (PROAMATINE ) 5 MG tablet Take 1 tablet (5 mg total) by mouth 3 (three) times daily with meals.  03/17/23   Maudine Sos, MD  montelukast  (SINGULAIR ) 10 MG tablet Take 1 tablet (10 mg total) by mouth daily. 06/01/23   Tabori, Katherine E, MD  Multiple Vitamin (MULTIVITAMIN WITH MINERALS) TABS tablet Take 1 tablet by mouth daily. 05/29/20   Armenta Landau, MD  ondansetron  (ZOFRAN -ODT) 4 MG disintegrating tablet Take 1 tablet (4 mg total) by mouth every 8 (eight) hours as needed for nausea or vomiting. 12/08/23   Tabori, Katherine E, MD  pantoprazole  (PROTONIX ) 40 MG tablet Take 1 tablet (40 mg total) by mouth daily. 12/13/23   Zackowski, Scott, MD  polyethylene glycol (MIRALAX  / GLYCOLAX ) 17 g packet Take 17 g by mouth 3 (three) times daily. Patient taking differently: Take 17 g by mouth 3 (three) times daily as needed for moderate constipation. 05/28/20   Armenta Landau, MD  potassium chloride  SA (KLOR-CON  M) 20 MEQ tablet Take 1 tablet (20 mEq total) by mouth daily. 12/13/23   Zackowski, Scott, MD  prochlorperazine (COMPAZINE) 25 MG suppository Place 25 mg rectally every 12 (twelve) hours as needed for nausea or vomiting.    [provider]  traZODone  (DESYREL ) 50 MG tablet Take 0.5-1 tablets (25-50 mg total) by mouth at bedtime as needed for sleep. 12/08/23   Jess Morita, MD     Critical care time: 60 min     Madelynn Schilder, MD [facility]

## 2023-12-23 NOTE — Procedures (Signed)
 Arterial Catheter Insertion Procedure Note  Stacy [name]  [id number]  Jan 03, 1981  Date:12/23/23  Time:5:52 PM    Provider Performing: Laurina Popper D. [name]    Procedure: Insertion of Arterial Line (40981) with US  guidance (19147)   Indication(s) Blood pressure monitoring and/or need for frequent ABGs  Consent Risks of the procedure as well as the alternatives and risks of each were explained to the patient and/or caregiver.  Consent for the procedure was obtained and is signed in the bedside chart  Anesthesia None   Time Out Verified patient identification, verified procedure, site/side was marked, verified correct patient position, special equipment/implants available, medications/allergies/relevant history reviewed, required imaging and test results available.   Sterile Technique Maximal sterile technique including full sterile barrier drape, hand hygiene, sterile gown, sterile gloves, mask, hair covering, sterile ultrasound probe cover (if used).   Procedure Description Area of catheter insertion was cleaned with chlorhexidine  and draped in sterile fashion. With real-time ultrasound guidance an arterial catheter was placed into the right femoral artery.  Appropriate arterial tracings confirmed on monitor.     Complications/Tolerance None; patient tolerated the procedure well.   EBL Minimal   Specimen(s) None   Stacy Troup D. Harris, NP-C [facility] Personal contact information can be found on Amion  If no contact or response made please call [phone] 12/23/2023, 5:52 PM

## 2023-12-23 NOTE — ED Provider Notes (Signed)
 [name] [facility] AT [facility] Provider Note   CSN: [id number] Arrival date & time: 12/23/23  1412     History  No chief complaint on file.   Stacy [name] is a 43 y.o. female.  43 yo F with a chief complaints of hypoxia and hypotension after endoscopy.  Patient was getting endoscopy as an outpatient today for decreased oral intake epigastric pain.    Patient is a bit confused tells me that her chest hurts.  Has not been doing really well at home.  Having trouble eating and drinking.  Does feel like she moved her bowels recently.  Having some diarrhea per her mother.        Home Medications Prior to Admission medications   Medication Sig Start Date End Date Taking? Authorizing Provider  acetaminophen  (TYLENOL ) 500 MG tablet Take 2 tablets (1,000 mg total) by mouth every 6 (six) hours as needed for mild pain (pain score 1-3), fever or headache. 11/15/23  Yes Elwin Hammond, PA-C  cyanocobalamin  (,VITAMIN B-12,) 1000 MCG/ML injection Inject 1,000 mcg into the muscle every 30 (thirty) days.   Yes [provider]  megestrol  (MEGACE  ES) 625 MG/5ML suspension SHAKE LIQUID AND TAKE 5 ML(625 MG) BY MOUTH DAILY 10/24/23  Yes Tabori, Katherine E, MD  midodrine  (PROAMATINE ) 5 MG tablet Take 1 tablet (5 mg total) by mouth 3 (three) times daily with meals. 03/17/23  Yes Maudine Sos, MD  montelukast  (SINGULAIR ) 10 MG tablet Take 1 tablet (10 mg total) by mouth daily. 06/01/23  Yes Tabori, Katherine E, MD  Multiple Vitamin (MULTIVITAMIN WITH MINERALS) TABS tablet Take 1 tablet by mouth daily. Patient taking differently: Take 1 tablet by mouth every evening. 05/29/20  Yes Armenta Landau, MD  ondansetron  (ZOFRAN -ODT) 4 MG disintegrating tablet Take 1 tablet (4 mg total) by mouth every 8 (eight) hours as needed for nausea or vomiting. 12/08/23  Yes Jess Morita, MD  pantoprazole  (PROTONIX ) 40 MG tablet Take 1 tablet (40 mg total) by mouth  daily. Patient taking differently: Take 40 mg by mouth 2 (two) times daily. 12/13/23  Yes Zackowski, Scott, MD  polyethylene glycol (MIRALAX  / GLYCOLAX ) 17 g packet Take 17 g by mouth 3 (three) times daily. Patient taking differently: Take 17 g by mouth 3 (three) times daily as needed for moderate constipation. 05/28/20  Yes Armenta Landau, MD  traZODone  (DESYREL ) 50 MG tablet Take 0.5-1 tablets (25-50 mg total) by mouth at bedtime as needed for sleep. Patient taking differently: Take 50 mg by mouth at bedtime as needed for sleep. 12/08/23  Yes Tabori, Katherine E, MD  Vitamin D , Ergocalciferol , (DRISDOL ) 1.25 MG (50000 UNIT) CAPS capsule Take 50,000 Units by mouth once a week. 11/02/23  Yes [provider]  famotidine  (PEPCID ) 40 MG/5ML suspension Take 5 mLs (40 mg total) by mouth daily. Patient not taking: Reported on 12/23/2023 12/12/23   Tabori, Katherine E, MD  fludrocortisone  (FLORINEF ) 0.1 MG tablet TAKE 1 TABLET(0.1 MG) BY MOUTH TWICE DAILY Patient not taking: Reported on 12/23/2023 07/25/23   Tabori, Katherine E, MD  potassium chloride  SA (KLOR-CON  M) 20 MEQ tablet Take 1 tablet (20 mEq total) by mouth daily. Patient not taking: Reported on 12/23/2023 12/13/23   Zackowski, Scott, MD  prochlorperazine (COMPAZINE) 25 MG suppository Place 25 mg rectally every 12 (twelve) hours as needed for nausea or vomiting. Patient not taking: Reported on 12/23/2023    [provider]      Allergies  Tetracyclines & related, Erythromycin, Raspberry, Sulfa antibiotics, and Sulfonamide derivatives    Review of Systems   Review of Systems  Physical Exam Updated Vital Signs BP (!) 73/44 (BP Location: Right Leg)   Pulse (!) 130   Temp 98.1 F (36.7 C) (Axillary)   Resp (!) 23   Ht 5\' 3"  (1.6 m)   Wt 43.8 kg   LMP 10/29/2023   SpO2 91%   BMI 17.11 kg/m  Physical Exam Vitals and nursing note reviewed.  Constitutional:      General: She is not in acute distress.    Appearance: She  is well-developed. She is not diaphoretic.  HENT:     Head: Normocephalic and atraumatic.  Eyes:     Pupils: Pupils are equal, round, and reactive to light.  Cardiovascular:     Rate and Rhythm: Normal rate and regular rhythm.     Heart sounds: No murmur heard.    No friction rub. No gallop.  Pulmonary:     Effort: Pulmonary effort is normal.     Breath sounds: No wheezing or rales.  Abdominal:     General: There is distension.     Palpations: Abdomen is soft.     Tenderness: There is no abdominal tenderness.     Comments: Distended abdomen.  Tympanitic to percussion.  Musculoskeletal:        General: No tenderness.     Cervical back: Normal range of motion and neck supple.  Skin:    General: Skin is warm and dry.  Neurological:     Mental Status: She is alert and oriented to person, place, and time.  Psychiatric:        Behavior: Behavior normal.     ED Results / Procedures / Treatments   Labs (all labs ordered are listed, but only abnormal results are displayed) Labs Reviewed  I-STAT VENOUS BLOOD GAS, ED - Abnormal; Notable for the following components:      Result Value   pCO2, Ven 25.2 (*)    pO2, Ven 30 (*)    Bicarbonate 15.1 (*)    TCO2 16 (*)    Acid-base deficit 9.0 (*)    Calcium , Ion 1.04 (*)    HCT 28.0 (*)    Hemoglobin 9.5 (*)    All other components within normal limits  I-STAT CHEM 8, ED - Abnormal; Notable for the following components:   Sodium 132 (*)    Chloride 113 (*)    BUN 47 (*)    Glucose, Bld 103 (*)    Calcium , Ion 0.85 (*)    TCO2 15 (*)    Hemoglobin 11.2 (*)    HCT 33.0 (*)    All other components within normal limits  CULTURE, BLOOD (ROUTINE X 2)  CULTURE, BLOOD (ROUTINE X 2)  COMPREHENSIVE METABOLIC PANEL WITH GFR  CBC WITH DIFFERENTIAL/PLATELET  PROTIME-INR  HCG, SERUM, QUALITATIVE  URINALYSIS, W/ REFLEX TO CULTURE (INFECTION SUSPECTED)  MAGNESIUM   PHOSPHORUS  PROCALCITONIN  I-STAT CG4 LACTIC ACID, ED  TROPONIN I (HIGH  SENSITIVITY)    EKG EKG Interpretation Date/Time:  Friday Dec 23 2023 14:24:00 EDT Ventricular Rate:  121 PR Interval:  116 QRS Duration:  90 QT Interval:  321 QTC Calculation: 456 R Axis:   46  Text Interpretation: Sinus tachycardia Paired ventricular premature complexes Low voltage, precordial leads Repol abnrm suggests ischemia, lateral leads No significant change since last tracing Confirmed by Albertus Hughs [phone]) on 12/23/2023 2:45:54 PM  Radiology No results found.  Procedures .Critical Care  Performed by: Albertus Hughs, DO Authorized by: Albertus Hughs, DO   Critical care provider statement:    Critical care time (minutes):  35   Critical care time was exclusive of:  Separately billable procedures and treating other patients   Critical care was time spent personally by me on the following activities:  Development of treatment plan with patient or surrogate, discussions with consultants, evaluation of patient's response to treatment, examination of patient, ordering and review of laboratory studies, ordering and review of radiographic studies, ordering and performing treatments and interventions, pulse oximetry, re-evaluation of patient's condition and review of old charts   Care discussed with: admitting provider      Procedure note: Ultrasound Guided Peripheral IV Ultrasound guided peripheral 1.88 inch angiocath IV placement performed by me. Indications: Nursing unable to place IV. Details: The antecubital fossa and upper arm were evaluated with a multifrequency linear probe. Patent brachial veins were noted. 1 attempt was made to cannulate a vein under realtime US  guidance with successful cannulation of the vein and catheter placement. There is return of non-pulsatile dark red blood. The patient tolerated the procedure well without complications. Images archived electronically.  CPT codes: 16109 and [phone]    Medications Ordered in ED Medications  vancomycin (VANCOCIN) IVPB 1000  mg/200 mL premix (1,000 mg Intravenous New Bag/Given 12/23/23 1500)  metoCLOPramide  (REGLAN ) injection 10 mg (10 mg Intravenous Given 12/23/23 1434)  diphenhydrAMINE  (BENADRYL ) injection 25 mg (25 mg Intravenous Given 12/23/23 1434)  pantoprazole  (PROTONIX ) injection 40 mg (40 mg Intravenous Given 12/23/23 1435)  piperacillin -tazobactam (ZOSYN ) IVPB 3.375 g (0 g Intravenous Stopped 12/23/23 1530)  sodium chloride  0.9 % bolus 1,000 mL (1,000 mLs Intravenous New Bag/Given 12/23/23 1455)    ED Course/ Medical Decision Making/ A&P                                 Medical Decision Making Amount and/or Complexity of Data Reviewed Labs: ordered. Radiology: ordered.  Risk Prescription drug management.   43 yo F with a cc of hypotension hypoxia post endoscopy.  Patient blood pressure in the 70s on arrival.  Requiring 14 L of oxygen.  Plan to progressively resuscitate.  X-ray of the chest.  Lab work.  Likely will need CT imaging if able.  Chest x-ray on my independent interpretation with no obvious lung markings to the apex.  Some concern for aspiration as well.  Will start on broad-spectrum antibiotics  I discussed case with critical care.  Will come and evaluate at bedside.  Patient remains hypotensive given 2 bags IV fluids wide open.  The patients results and plan were reviewed and discussed.   Any x-rays performed were independently reviewed by myself.   Differential diagnosis were considered with the presenting HPI.  Medications  vancomycin (VANCOCIN) IVPB 1000 mg/200 mL premix (1,000 mg Intravenous New Bag/Given 12/23/23 1500)  metoCLOPramide  (REGLAN ) injection 10 mg (10 mg Intravenous Given 12/23/23 1434)  diphenhydrAMINE  (BENADRYL ) injection 25 mg (25 mg Intravenous Given 12/23/23 1434)  pantoprazole  (PROTONIX ) injection 40 mg (40 mg Intravenous Given 12/23/23 1435)  piperacillin -tazobactam (ZOSYN ) IVPB 3.375 g (0 g Intravenous Stopped 12/23/23 1530)  sodium chloride  0.9 % bolus 1,000 mL (1,000 mLs  Intravenous New Bag/Given 12/23/23 1455)    Vitals:   12/23/23 1421 12/23/23 1430 12/23/23 1445 12/23/23 1518  BP: (!) 71/46 (!) 74/55 (!) 64/52 (!) 73/44  Pulse: (!) 113 (!) 120 (!) 130  Resp: (!) 28 (!) 27 (!) 23   Temp: 98.1 F (36.7 C)     TempSrc: Axillary     SpO2: 90% 91% 91%   Weight:      Height:        Final diagnoses:  Hypovolemic shock (HCC)    Admission/ observation were discussed with the admitting physician, patient and/or family and they are comfortable with the plan.           Final Clinical Impression(s) / ED Diagnoses Final diagnoses:  Hypovolemic shock [facility])    Rx / DC Orders ED Discharge Orders     None         Albertus Hughs, DO 12/23/23 1535

## 2023-12-24 ENCOUNTER — Inpatient Hospital Stay (HOSPITAL_COMMUNITY)

## 2023-12-24 DIAGNOSIS — R571 Hypovolemic shock: Secondary | ICD-10-CM

## 2023-12-24 DIAGNOSIS — J9601 Acute respiratory failure with hypoxia: Secondary | ICD-10-CM | POA: Diagnosis not present

## 2023-12-24 DIAGNOSIS — R6521 Severe sepsis with septic shock: Secondary | ICD-10-CM | POA: Diagnosis not present

## 2023-12-24 DIAGNOSIS — A419 Sepsis, unspecified organism: Secondary | ICD-10-CM | POA: Diagnosis not present

## 2023-12-24 DIAGNOSIS — J690 Pneumonitis due to inhalation of food and vomit: Secondary | ICD-10-CM | POA: Diagnosis not present

## 2023-12-24 LAB — URINALYSIS, W/ REFLEX TO CULTURE (INFECTION SUSPECTED)
Bilirubin Urine: NEGATIVE
Glucose, UA: NEGATIVE mg/dL
Ketones, ur: NEGATIVE mg/dL
Leukocytes,Ua: NEGATIVE
Nitrite: NEGATIVE
Protein, ur: 30 mg/dL — AB
Specific Gravity, Urine: 1.027 (ref 1.005–1.030)
pH: 5 (ref 5.0–8.0)

## 2023-12-24 LAB — ECHOCARDIOGRAM COMPLETE
AR max vel: 2.58 cm2
AV Peak grad: 5.5 mmHg
Ao pk vel: 1.17 m/s
Area-P 1/2: 6.27 cm2
Height: 63 in
S' Lateral: 2.6 cm
Weight: 1506.18 [oz_av]

## 2023-12-24 LAB — CBC
HCT: 26.4 % — ABNORMAL LOW (ref 36.0–46.0)
Hemoglobin: 8.4 g/dL — ABNORMAL LOW (ref 12.0–15.0)
MCH: 27.5 pg (ref 26.0–34.0)
MCHC: 31.8 g/dL (ref 30.0–36.0)
MCV: 86.6 fL (ref 80.0–100.0)
Platelets: 651 10*3/uL — ABNORMAL HIGH (ref 150–400)
RBC: 3.05 MIL/uL — ABNORMAL LOW (ref 3.87–5.11)
RDW: 14.9 % (ref 11.5–15.5)
WBC: 26.2 10*3/uL — ABNORMAL HIGH (ref 4.0–10.5)
nRBC: 0 % (ref 0.0–0.2)

## 2023-12-24 LAB — BASIC METABOLIC PANEL WITH GFR
Anion gap: 12 (ref 5–15)
BUN: 47 mg/dL — ABNORMAL HIGH (ref 6–20)
CO2: 15 mmol/L — ABNORMAL LOW (ref 22–32)
Calcium: 7.8 mg/dL — ABNORMAL LOW (ref 8.9–10.3)
Chloride: 110 mmol/L (ref 98–111)
Creatinine, Ser: 1.03 mg/dL — ABNORMAL HIGH (ref 0.44–1.00)
GFR, Estimated: >60 mL/min (ref >60)
Glucose, Bld: 129 mg/dL — ABNORMAL HIGH (ref 70–99)
Potassium: 3.3 mmol/L — ABNORMAL LOW (ref 3.5–5.1)
Sodium: 137 mmol/L (ref 135–145)

## 2023-12-24 LAB — GLUCOSE, CAPILLARY
Glucose-Capillary: 127 mg/dL — ABNORMAL HIGH (ref 70–99)
Glucose-Capillary: 113 mg/dL — ABNORMAL HIGH (ref 70–99)
Glucose-Capillary: 119 mg/dL — ABNORMAL HIGH (ref 70–99)
Glucose-Capillary: 115 mg/dL — ABNORMAL HIGH (ref 70–99)
Glucose-Capillary: 107 mg/dL — ABNORMAL HIGH (ref 70–99)
Glucose-Capillary: 146 mg/dL — ABNORMAL HIGH (ref 70–99)

## 2023-12-24 LAB — VITAMIN B12: Vitamin B-12: 459 pg/mL (ref 180–914)

## 2023-12-24 LAB — FERRITIN: Ferritin: 254 ng/mL (ref 11–307)

## 2023-12-24 LAB — RETICULOCYTES
Immature Retic Fract: 34.1 % — ABNORMAL HIGH (ref 2.3–15.9)
RBC.: 3.05 MIL/uL — ABNORMAL LOW (ref 3.87–5.11)
Retic Count, Absolute: 81.7 10*3/uL (ref 19.0–186.0)
Retic Ct Pct: 2.7 % (ref 0.4–3.1)

## 2023-12-24 LAB — FOLATE: Folate: 13.1 ng/mL (ref >5.9)

## 2023-12-24 LAB — IRON AND TIBC
Iron: 11 ug/dL — ABNORMAL LOW (ref 28–170)
Saturation Ratios: 7 % — ABNORMAL LOW (ref 10.4–31.8)
TIBC: 148 ug/dL — ABNORMAL LOW (ref 250–450)
UIBC: 137 ug/dL

## 2023-12-24 LAB — MAGNESIUM: Magnesium: 2.1 mg/dL (ref 1.7–2.4)

## 2023-12-24 LAB — PHOSPHORUS: Phosphorus: 5.1 mg/dL — ABNORMAL HIGH (ref 2.5–4.6)

## 2023-12-24 MED ORDER — CYANOCOBALAMIN 1000 MCG/ML IJ SOLN
1000.0000 ug | Freq: Once | INTRAMUSCULAR | Status: AC
  Administered 2023-12-24: 1000 ug via INTRAMUSCULAR
  Filled 2023-12-24: qty 1

## 2023-12-24 MED ORDER — MELATONIN 5 MG PO TABS
5.0000 mg | ORAL_TABLET | Freq: Every evening | ORAL | Status: DC | PRN
  Administered 2023-12-24 – 2024-01-02 (×7): 5 mg via ORAL
  Filled 2023-12-24 (×8): qty 1

## 2023-12-24 MED ORDER — POLYETHYLENE GLYCOL 3350 17 G PO PACK
17.0000 g | PACK | Freq: Two times a day (BID) | ORAL | Status: DC
  Administered 2023-12-24 – 2024-01-03 (×10): 17 g via ORAL
  Filled 2023-12-24 (×13): qty 1

## 2023-12-24 MED ORDER — THIAMINE HCL 100 MG/ML IJ SOLN
100.0000 mg | Freq: Every day | INTRAMUSCULAR | Status: DC
  Administered 2023-12-24 – 2023-12-27 (×4): 100 mg via INTRAVENOUS
  Filled 2023-12-24 (×4): qty 2

## 2023-12-24 MED ORDER — IPRATROPIUM-ALBUTEROL 0.5-2.5 (3) MG/3ML IN SOLN: 3.0000 mL | Freq: Four times a day (QID) | RESPIRATORY_TRACT | Status: DC | PRN

## 2023-12-24 MED ORDER — TRAVASOL 10 % IV SOLN
INTRAVENOUS | Status: AC
  Filled 2023-12-24: qty 396

## 2023-12-24 MED ORDER — IRON SUCROSE 20 MG/ML IV SOLN
100.0000 mg | Freq: Once | INTRAVENOUS | Status: AC
  Administered 2023-12-24: 100 mg via INTRAVENOUS
  Filled 2023-12-24: qty 5

## 2023-12-24 MED ORDER — TRACE MINERALS CU-MN-SE-ZN 300-55-60-3000 MCG/ML IV SOLN: INTRAVENOUS | Status: DC

## 2023-12-24 MED ORDER — INSULIN ASPART 100 UNIT/ML IJ SOLN
1.0000 [IU] | INTRAMUSCULAR | Status: DC
  Administered 2023-12-24 – 2023-12-25 (×4): 1 [IU] via SUBCUTANEOUS

## 2023-12-24 MED ORDER — PANTOPRAZOLE SODIUM 40 MG PO TBEC
40.0000 mg | DELAYED_RELEASE_TABLET | Freq: Every day | ORAL | Status: DC
  Administered 2023-12-24: 40 mg via ORAL
  Filled 2023-12-24: qty 1

## 2023-12-24 MED ORDER — SIMETHICONE 80 MG PO CHEW
80.0000 mg | CHEWABLE_TABLET | Freq: Four times a day (QID) | ORAL | Status: DC | PRN
  Administered 2023-12-24 – 2023-12-27 (×2): 80 mg via ORAL
  Filled 2023-12-24 (×3): qty 1

## 2023-12-24 MED ORDER — CHLORHEXIDINE GLUCONATE CLOTH 2 % EX PADS
6.0000 | MEDICATED_PAD | CUTANEOUS | Status: DC
  Administered 2023-12-24 – 2024-01-02 (×10): 6 via TOPICAL

## 2023-12-24 MED ORDER — MIDODRINE HCL 5 MG PO TABS
5.0000 mg | ORAL_TABLET | Freq: Three times a day (TID) | ORAL | Status: DC
  Administered 2023-12-24 (×2): 5 mg via ORAL
  Filled 2023-12-24 (×2): qty 1

## 2023-12-24 MED ORDER — WHITE PETROLATUM EX OINT
TOPICAL_OINTMENT | CUTANEOUS | Status: DC | PRN
  Filled 2023-12-24: qty 28.35

## 2023-12-24 MED ORDER — POTASSIUM CHLORIDE 10 MEQ/50ML IV SOLN
10.0000 meq | INTRAVENOUS | Status: AC
  Administered 2023-12-24 (×6): 10 meq via INTRAVENOUS
  Filled 2023-12-24 (×6): qty 50

## 2023-12-24 MED ORDER — POLYETHYLENE GLYCOL 3350 17 G PO PACK
17.0000 g | PACK | Freq: Every day | ORAL | Status: DC
  Administered 2023-12-24: 17 g via ORAL
  Filled 2023-12-24: qty 1

## 2023-12-24 MED ORDER — PANTOPRAZOLE SODIUM 40 MG PO TBEC
40.0000 mg | DELAYED_RELEASE_TABLET | Freq: Two times a day (BID) | ORAL | Status: DC
  Administered 2023-12-24: 40 mg via ORAL
  Filled 2023-12-24: qty 1

## 2023-12-24 NOTE — Progress Notes (Signed)
 Echocardiogram 2D Echocardiogram has been performed.  Stacy [name] 12/24/2023, 10:48 AM

## 2023-12-24 NOTE — Progress Notes (Signed)
 PHARMACY - TOTAL PARENTERAL NUTRITION CONSULT NOTE   Indication: Prolonged ileus  Patient Measurements: Height: 5\' 3"  (160 cm) Weight: 42.7 kg (94 lb 2.2 oz) IBW/kg (Calculated) : 52.4 TPN AdjBW (KG): 43.8 Body mass index is 16.68 kg/m. Usual Weight: ~110 lbs  Assessment:  43 yo W with nausea and vomiting for 2 weeks with at least 12 lbs weight loss due to poor PO intake. In March 2025, patient had colon perforation, pericolic abscess, diverticulitis, peritonitis, and septic shock s/p ex lap and right colectomy followed by ileocolic anastomosis and cholecystectomy.  Patient required TPN 3/18- 3/31 due to post-op ileus. CT on 4/29 was suspicious for ileus. Patient had an outpatient EGD 12/23/23 complicated by aspiration and hypotension. Work up for N/V ongoing. Pharmacy consulted for TPN.   Glucose / Insulin : CBG 102 - 129 with 2u SSI usage prior to TPN initiation Electrolytes: Na 137, K 3.3, Cl 110, CO2 15, CoCa 9.2, P 5.1, Mg 2.1 Renal: Scr 1.03 (b/l 0.56), BUN 47 (b/l 30) Hepatic: 5/9 albumin  2.3, AST/ALT 25/29, Tbili 1.6 Intake / Output; MIVF:  100 mL UOP + 2 unmeasured, net +1.9 L (LR at 125 x24h); LBM unknown Reglan  5/9 >>  GI imaging: 4/29 CT: Mild-to-moderate diffuse small bowel dilatation, without definite transition point or significant interval change, suspicious for ileus. 5/9 KUB: dilated small bowel  GI procedures: none this admit  Central access: CVC 5/9  TPN start date: 5/10   Nutritional Goals: Goal TPN rate is 60 mL/hr (55g/L AA, 15% dextrose , 32 g/L lipids) provides 79 g of protein and 1511 kcals per day  RD Assessment: pending   March 2025 Estimated Needs (weight was ~110lbs then) Total Energy Estimated Needs: 1600-1750 kcals Total Protein Estimated Needs: 85-100 grams Total Fluid Estimated Needs: >/= 1.6L  Current Nutrition:  NPO  Plan:  Start TPN at 30 mL/hr at 1800, half of goal rate providing 50% of needs and 108 g carbohydrates Electrolytes in TPN:  Na 50 mEq/L, K 25 mEq/L (18 mEq in total), Ca 5 mEq/L, Mg 5 mEq/L, and Phos 8 mmol/L (total 6 mmol). Max acetate KCl 10 mEq IV x6 ordered by eLink Monitor refeeding lytes Add standard MVI and trace elements to TPN No chromium due to shortage Continue Sensitive q4h SSI and adjust as needed  IVF with stop time of 5/10 at 1800 Monitor TPN labs on Mon/Thurs, daily x 3 days upon initiation and PRN - next labs 5/11 AM  Heddy Liverpool, PharmD PGY2 Critical Care Pharmacy Resident Please check AMION for all [facility] Pharmacy phone numbers After 10:00 PM, call Main Pharmacy [phone]

## 2023-12-24 NOTE — Progress Notes (Signed)
 [facility] ADULT ICU REPLACEMENT PROTOCOL   The patient does apply for the [facility] Adult ICU Electrolyte Replacment Protocol based on the criteria listed below:   1.Exclusion criteria: TCTS, ECMO, Dialysis, and Myasthenia Gravis patients 2. Is GFR >/= 30 ml/min? Yes.    Patient's GFR today is >60 3. Is SCr </= 2? Yes.   Patient's SCr is 1.03 mg/dL 4. Did SCr increase >/= 0.5 in 24 hours? No. 5.Pt's weight >40kg  Yes.   6. Abnormal electrolyte(s): Potassium  7. Electrolytes replaced per protocol 8.  Call MD STAT for K+ </= 2.5, Phos </= 1, or Mag </= 1 Physician:  Dr. Ballard Bongo Stacy [name] 12/24/2023 4:58 AM

## 2023-12-24 NOTE — Progress Notes (Addendum)
 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  She is admitted with aspiration pneumonia and acute hypoxic respiratory failure.  Requesting something for ongoing burping and requesting something for insomnia other than trazodone   eICU Interventions  Burping is a difficult problem to solve in the setting of chronic pseudoobstruction/constipation with severe GERD.  Already on all the appropriate medications for her underlying diseases.  Add simethicone .  Add melatonin as needed   2130 -had a bout of emesis with malodorous feculent like material  Will order repeat KUB and assess whether the patient needs NG decompression.  Return to n.p.o. for now 2251 -KUB reviewed with stable bowel gas pattern.  For now, replace NG tube in place on low intermittent suction 0209 - Add Zofran  PRN 0558 -K3.2, KCl IV, TPN will need adjustment  Intervention Category Minor Interventions: Routine modifications to care plan (e.g. PRN medications for pain, fever)  [place]akeasha Petion 12/24/2023, 8:42 PM

## 2023-12-24 NOTE — Consult Note (Signed)
 Referring Provider: ED Primary Care Physician:  Jess Morita, MD Primary Gastroenterologist:  Dr. Honey Lusty  Reason for Consultation: Nausea, vomiting, weight loss, failure to thrive  HPI: Stacy [name] is a 43 y.o. female with past medical history of history of chronic idiopathic constipation with history of colonic perforation likely from complicated diverticulitis requiring right colectomy on October 29, 2023 which was complicated by postoperative ileus requiring subsequent reexploration of the abdomen and cholecystectomy ileocolonic anastomosis on October 31, 2023.  She required prolonged hospitalization and TPN afterwards.  And history of chronic orthostatic hypotension on Florinef  and midodrine  was sent to ED after endoscopy yesterday.  Underwent EGD with Dr. Honey Lusty yesterday for evaluation of abnormal CT scan concerning for esophagitis as well as for evaluation of reflux on Dec 23, 2023 and was found to have retained fluid in the esophagus, large amount of retained food in the stomach, LA grade C esophagitis and gastritis.   Patient had hypoxia and hypotension after the procedure with concern for aspiration was sent to ED for further evaluation. Chest x-ray showed possible multifocal pneumonia.  She is currently on antibiotics.  Patient with ongoing symptoms since last admission.  Continues to have constipation with no bowel movements for last 4 days.  Also continues to have intermittent nausea and vomiting.   Past Medical History:  Diagnosis Date   Anxiety    Benign liver cyst    Chronic intestinal pseudo-obstruction    GERD (gastroesophageal reflux disease)    History of UTI    Nausea and vomiting 04/28/2020    Past Surgical History:  Procedure Laterality Date   BIOPSY  05/20/2020   Procedure: BIOPSY;  Surgeon: Celedonio Coil, MD;  Location: [facility];  Service: Endoscopy;;   CESAREAN SECTION     twice 2010, 2013   CHOLECYSTECTOMY     ESOPHAGOGASTRODUODENOSCOPY  (EGD) WITH PROPOFOL  N/A 05/20/2020   Procedure: ESOPHAGOGASTRODUODENOSCOPY (EGD) WITH PROPOFOL ;  Surgeon: Celedonio Coil, MD;  Location: [facility];  Service: Endoscopy;  Laterality: N/A;   Esure  ~2015   LAPAROTOMY N/A 10/29/2023   Procedure: LAPAROTOMY, EXPLORATORY; RIGHT EXTENDED COLECTOMY;  Surgeon: Adalberto Acton, MD;  Location: [facility];  Service: General;  Laterality: N/A;   LAPAROTOMY N/A 10/31/2023   Procedure: RE-EXPLORATION OF ABDOMEN, CHOLECYSTECTOMY, ILEOCOLONIC ANASTOMOSIS;  Surgeon: Aldean Hummingbird, MD;  Location: [facility];  Service: General;  Laterality: N/A;    Prior to Admission medications   Medication Sig Start Date End Date Taking? Authorizing Provider  acetaminophen  (TYLENOL ) 500 MG tablet Take 2 tablets (1,000 mg total) by mouth every 6 (six) hours as needed for mild pain (pain score 1-3), fever or headache. 11/15/23  Yes Elwin Hammond, PA-C  cyanocobalamin  (,VITAMIN B-12,) 1000 MCG/ML injection Inject 1,000 mcg into the muscle every 30 (thirty) days.   Yes [provider]  megestrol  (MEGACE  ES) 625 MG/5ML suspension SHAKE LIQUID AND TAKE 5 ML(625 MG) BY MOUTH DAILY 10/24/23  Yes Tabori, Katherine E, MD  midodrine  (PROAMATINE ) 5 MG tablet Take 1 tablet (5 mg total) by mouth 3 (three) times daily with meals. 03/17/23  Yes Maudine Sos, MD  montelukast  (SINGULAIR ) 10 MG tablet Take 1 tablet (10 mg total) by mouth daily. 06/01/23  Yes Tabori, Katherine E, MD  Multiple Vitamin (MULTIVITAMIN WITH MINERALS) TABS tablet Take 1 tablet by mouth daily. Patient taking differently: Take 1 tablet by mouth every evening. 05/29/20  Yes Armenta Landau, MD  ondansetron  (ZOFRAN -ODT) 4 MG disintegrating tablet Take 1 tablet (4  mg total) by mouth every 8 (eight) hours as needed for nausea or vomiting. 12/08/23  Yes Jess Morita, MD  pantoprazole  (PROTONIX ) 40 MG tablet Take 1 tablet (40 mg total) by mouth daily. Patient taking differently: Take 40 mg by mouth 2 (two) times  daily. 12/13/23  Yes Zackowski, Scott, MD  polyethylene glycol (MIRALAX  / GLYCOLAX ) 17 g packet Take 17 g by mouth 3 (three) times daily. Patient taking differently: Take 17 g by mouth 3 (three) times daily as needed for moderate constipation. 05/28/20  Yes Armenta Landau, MD  traZODone  (DESYREL ) 50 MG tablet Take 0.5-1 tablets (25-50 mg total) by mouth at bedtime as needed for sleep. Patient taking differently: Take 50 mg by mouth at bedtime as needed for sleep. 12/08/23  Yes Tabori, Katherine E, MD  Vitamin D , Ergocalciferol , (DRISDOL ) 1.25 MG (50000 UNIT) CAPS capsule Take 50,000 Units by mouth once a week. 11/02/23  Yes [provider]  famotidine  (PEPCID ) 40 MG/5ML suspension Take 5 mLs (40 mg total) by mouth daily. Patient not taking: Reported on 12/23/2023 12/12/23   Tabori, Katherine E, MD  fludrocortisone  (FLORINEF ) 0.1 MG tablet TAKE 1 TABLET(0.1 MG) BY MOUTH TWICE DAILY Patient not taking: Reported on 12/23/2023 07/25/23   Tabori, Katherine E, MD  potassium chloride  SA (KLOR-CON  M) 20 MEQ tablet Take 1 tablet (20 mEq total) by mouth daily. Patient not taking: Reported on 12/23/2023 12/13/23   Zackowski, Scott, MD  prochlorperazine (COMPAZINE) 25 MG suppository Place 25 mg rectally every 12 (twelve) hours as needed for nausea or vomiting. Patient not taking: Reported on 12/23/2023    [provider]    Scheduled Meds:  Chlorhexidine  Gluconate Cloth  6 each Topical Daily   insulin  aspart  1-3 Units Subcutaneous Q4H   ipratropium-albuterol   3 mL Nebulization QID   metoCLOPramide  (REGLAN ) injection  5 mg Intravenous Q8H   pantoprazole  (PROTONIX ) IV  40 mg Intravenous Q12H   sodium chloride  flush  10-40 mL Intracatheter Q12H   Continuous Infusions:  ampicillin -sulbactam (UNASYN) IV Stopped (12/24/23 0542)   lactated ringers  125 mL/hr at 12/24/23 0800   norepinephrine  (LEVOPHED ) Adult infusion 3 mcg/min (12/24/23 0800)   potassium chloride  50 mL/hr at 12/24/23 0800   TPN  ADULT (ION)     PRN Meds:.docusate sodium , fentaNYL  (SUBLIMAZE ) injection, ondansetron  (ZOFRAN ) IV, mouth rinse, polyethylene glycol, sodium chloride  flush  Allergies as of 12/23/2023 - Review Complete 12/23/2023  Allergen Reaction Noted   Tetracyclines & related Swelling 09/11/2013   Erythromycin Other (See Comments) 06/06/2008   Raspberry Rash 09/11/2013   Sulfa antibiotics Rash 06/02/2020   Sulfonamide derivatives Rash 06/06/2008    Family History  Problem Relation Age of Onset   CAD Father    Hypertension Father    Diabetes Father    Diabetes Brother    Colon cancer Neg Hx    Breast cancer Neg Hx    Adrenal disorder Neg Hx     Social History   Socioeconomic History   Marital status: Married    Spouse name: Emad   Number of children: 2   Years of education: Not on file   Highest education level: Associate degree: occupational, Scientist, product/process development, or vocational program  Occupational History   Occupation: stay home ; associate degree paralega   Tobacco Use   Smoking status: Never   Smokeless tobacco: Never  Vaping Use   Vaping status: Never Used  Substance and Sexual Activity   Alcohol use: No    Comment: socially  Drug use: No   Sexual activity: Yes    Birth control/protection: None  Other Topics Concern   Not on file  Social History Narrative   Household: pt, husband , 2 children   2 boys: 2010, 2013   P2G2   Original  from [place] , no foreign trips   Social Drivers of Corporate investment banker Strain: Low Risk  (12/18/2023)   Overall Financial Resource Strain (CARDIA)    Difficulty of Paying Living Expenses: Not hard at all  Food Insecurity: No Food Insecurity (12/23/2023)   Hunger Vital Sign    Worried About Running Out of Food in the Last Year: Never true    Ran Out of Food in the Last Year: Never true  Transportation Needs: No Transportation Needs (12/23/2023)   PRAPARE - Administrator, Civil Service (Medical): No    Lack of Transportation  (Non-Medical): No  Physical Activity: Insufficiently Active (12/18/2023)   Exercise Vital Sign    Days of Exercise per Week: 5 days    Minutes of Exercise per Session: 20 min  Stress: No Stress Concern Present (12/18/2023)   [employer] of Occupational Health - Occupational Stress Questionnaire    Feeling of Stress : Not at all  Social Connections: Socially Integrated (12/18/2023)   Social Connection and Isolation Panel [NHANES]    Frequency of Communication with Friends and Family: More than three times a week    Frequency of Social Gatherings with Friends and Family: Twice a week    Attends Religious Services: 1 to 4 times per year    Active Member of [employer] or Organizations: Yes    Attends Engineer, structural: More than 4 times per year    Marital Status: Married  Catering manager Violence: Not At Risk (12/23/2023)   Humiliation, Afraid, Rape, and Kick questionnaire    Fear of Current or Ex-Partner: No    Emotionally Abused: No    Physically Abused: No    Sexually Abused: No    Review of Systems: All negative except as stated above in HPI.  Physical Exam: Vital signs: Vitals:   12/24/23 0748 12/24/23 0800  BP:    Pulse: (!) 121 (!) 126  Resp: (!) 21 (!) 28  Temp: 98.8 F (37.1 C)   SpO2: 97% 95%   Last BM Date :  (pta) General:   Alert,  Well-developed, well-nourished, pleasant and cooperative in NAD Lungs: No visible respiratory distress Heart:  Regular rate and rhythm; no murmurs, clicks, rubs,  or gallops. Abdomen: Mild distention without any significant tenderness, bowel sound present, no peritoneal signs Mood and affect normal Alert and oriented x 3 Rectal:  Deferred  GI:  Lab Results: Recent Labs    12/22/23 1048 12/23/23 1441 12/23/23 1447 12/23/23 1449 12/24/23 0226  WBC 12.6*  --  5.8  --  26.2*  HGB 8.7*   < > 9.9* 9.5* 8.4*  HCT 27.3*   < > 33.0* 28.0* 26.4*  PLT 745.0*  --  731*  --  651*   < > = values in this interval not displayed.    BMET Recent Labs    12/22/23 1048 12/23/23 1441 12/23/23 1447 12/23/23 1449 12/24/23 0226  NA 134* 132* 135 135 137  K 4.0 4.7 5.1 4.4 3.3*  CL 106 113* 109  --  110  CO2 20  --  14*  --  15*  GLUCOSE 86 103* 107*  --  129*  BUN 30* 47* 50*  --  47*  CREATININE 0.56 1.00 1.17*  --  1.03*  CALCIUM  8.6  --  8.3*  --  7.8*   LFT Recent Labs    12/22/23 1048 12/23/23 1447  PROT 6.3 6.1*  ALBUMIN  2.9* 2.3*  AST 14 25  ALT 33 29  ALKPHOS 70 73  BILITOT 0.5 1.6*  BILIDIR 0.2  --    PT/INR Recent Labs    12/23/23 1447  LABPROT 18.5*  INR 1.5*     Studies/Results: DG Abd 1 View Result Date: 12/23/2023 CLINICAL DATA:  NG tube placement. EXAM: ABDOMEN - 1 VIEW COMPARISON:  CT 12/13/2023 FINDINGS: Tip and side port of the enteric tube below the diaphragm in the stomach. Dilated small bowel centrally. Enteric sutures in the right abdomen IMPRESSION: Tip and side port of the enteric tube below the diaphragm in the stomach. Electronically Signed   By: Chadwick Colonel M.D.   On: 12/23/2023 20:01   DG Chest Port 1 View Result Date: 12/23/2023 CLINICAL DATA:  Sepsis.  Hypotension. EXAM: PORTABLE CHEST 1 VIEW COMPARISON:  December 13, 2023. FINDINGS: The heart size and mediastinal contours are within normal limits. Interval development of patchy airspace opacities throughout left lung concerning for multifocal pneumonia. Small left pleural effusion may be present. Minimal right basilar subsegmental atelectasis or scarring is noted. The visualized skeletal structures are unremarkable. IMPRESSION: Interval development of patchy left lung airspace opacities concerning for multifocal pneumonia. Minimal left pleural effusion. Electronically Signed   By: Rosalene Colon M.D.   On: 12/23/2023 15:54   US  EKG SITE RITE Result Date: 12/23/2023 If Site Rite image not attached, placement could not be confirmed due to current cardiac rhythm.   Impression/Plan: -Aspiration pneumonia with acute  hypoxic respiratory failure. - Hypotension likely combination of chronic orthostatic hypotension versus septic shock - Chronic idiopathic constipation status post right colectomy with ileocolonic anastomosis in March 2025 - Esophagitis  Recommendations --------------------------- - Continue supportive care with antibiotics and pressor support as needed. - She is currently on heart healthy diet - Recommend to increase MiraLAX  to twice a day and if needed consider increasing to 3 times a day.  If needed consider soapsuds enema. - Continue Reglan  for now -Increase Protonix  to 40 mg twice a day for her esophagitis - GI will follow.   LOS: 1 day   Felecia Hopper  MD, FACP 12/24/2023, 8:10 AM  Contact #  [phone]

## 2023-12-24 NOTE — Progress Notes (Signed)
 Initial Nutrition Assessment  DOCUMENTATION CODES:   Underweight, suspect severe malnutrition  INTERVENTION:   TPN to meet 100% estimated nutrition needs Add IV Thiamine  100 mg daily x 5 days HIGH Refeeding Risk: Monitor magnesium , potassium, and phosphorus daily for at least 3 days, MD/Pharmacist to replete as needed, as pt is at risk for refeeding syndrome given hx malnutrition, underweight, +wt loss, poor oral intake  Recommend considering B12 supplementation; serum value is low normal but CRP not checked with labs. If inflammation is present, B12 values usually expected to be high. Risk to pt is low if supplemented (even if not deficient). One time dose B12 1000 mcg IM ordered  Monitor tolerance of po intake; further recommendations regarding nutrition poc to follow. Even if pt able to tolerate some po, pt will likely still benefit from nutrition support (TPN vs EN) to improve nutrition and overall status  NUTRITION DIAGNOSIS:   Inadequate oral intake related to altered GI function, chronic illness as evidenced by per patient/family report.  GOAL:   Patient will meet greater than or equal to 90% of their needs  MONITOR:   PO intake, Labs, Weight trends, I & O's (TPN)  REASON FOR ASSESSMENT:   Consult New TPN/TNA  ASSESSMENT:   43 yo female admitted with N/V with wt loss and FTT with hx of chronic idiopathic constipation/chronic intestinal pseudo obstruction. Pt with hospitalization in March 2025 with colon perforation with peritonitis requiring R. Colectomy followed by Primary Ileocolonic anastomosis and cholecystectomy. Pt required TPN during this hospital stay. Pt with hx of GERD, Iron  def anemia, moderate malnutrition  5/09 Admitted 5/10 TPN initiation  Consult received for TPN, noted plan for PICC line. GI and Surgery consulted. No surgical interventions at present; Reglan  and miralax  in addition to protonix  has been ordered.   Pt with N/V for 2 weeks and underwent  eval in ED 10 days ago with CT abd/pelvis: Increased diffuse wall thickening of visualized distal thoracic esophagus, consistent with esophagitis. Mild-to-moderate diffuse small bowel dilatation, without definite transition point or significant interval change, suspicious for ileus. Mild improvement in diffuse small bowel wall thickening, suggesting improving enteritis. No evidence of abscess  Levophed  at 2 Abd distended/taut, BS hyperactive, no BM  MD has advanced diet to Heart Healthy for trial of po; monitor diet tolerance  Pt with reported recent poor po intake x 2 weeks. Pt with intermittent N/V and constipation in addition to wt loss.   Current wt 42.7 kg with +wt loss trend per weight encounters. Unable to obtain detailed diet and weight history at this time.   Suspect pt with severe malnutrition, plan to attempt to obtain further information on follow-up to allow diagnosis. Pt with previous diagnosed moderate malnutrition  Noted pt was seen multiple times by Trenia Fritter RD (outpatient) in 2017-2018 for wt loss with possible concern for disordered eating.   Micronutrient Labs: Iron  11 (L)-received Iron  infusion today (venofer ) Folate 13.1 (wdl) Vit B12 459 (L normal)  Labs: CBGs 113-133 (acceptable range) Sodium 137 (wdl) Potassium 3.3 (L) Phosphorus 5.1 (H) Magnesium  2.1 (wdl) BUN 47 Creatinine 1.03  Meds:  LR at 75 ml/hr x 24 hours Reglan    NUTRITION - FOCUSED PHYSICAL EXAM:  Perform on follow-up  Diet Order:   Diet Order             Diet Heart Room service appropriate? Yes; Fluid consistency: Thin  Diet effective now  EDUCATION NEEDS:   Not appropriate for education at this time  Skin:  Skin Assessment: Reviewed RN Assessment  Last BM:     Height:   Ht Readings from Last 1 Encounters:  12/23/23 5\' 3"  (1.6 m)    Weight:   Wt Readings from Last 1 Encounters:  12/24/23 42.7 kg    BMI:  Body mass index is 16.68  kg/m.  Estimated Nutritional Needs:   Kcal:  1500-1600 kcals  Protein:  75-85 g  Fluid:  1.5 L   Norvel Beer MS, RDN, LDN, CNSC Registered Dietitian 3 Clinical Nutrition RD Inpatient Contact Info in Amion

## 2023-12-24 NOTE — Progress Notes (Signed)
 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 She was sent to ED for eval. Currently, she is on NRM 15 L, SpO2 94%, having chills, SOB, tachycardic, and tachypneic. She has N/V for 2 weeks and underwent eval in ED one 10 days ago with CT abd/pelvis:  Increased diffuse wall thickening of visualized distal thoracic esophagus, consistent with esophagitis. Mild-to-moderate diffuse small bowel dilatation, without definite transition point or significant interval change, suspicious for ileus. Mild improvement in diffuse small bowel wall thickening, suggesting improving enteritis. No evidence of abscess.   Her last BM was 4 days ago, soft. Denied f/c/r before EGD. Denied abd pain or worsening distension. Compliant with meds. No alcohol drinking, smoking, or illicit drug use. She gained after discharge but in the last 2 weeks, she has poor intake.    In October 29, 2023, she has colon perforation, pericolic abscess, diverticulitis, septic shock due to peritonitis, s/p ex lap and right colectomy, followed by ex lap with primary ileocolonic anastomosis and cholecystectomy. She developed post-operative Ileus, and she was placed on TPN for almost 10 days  before resuming oral intake.   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 5/10: On Levophed  3 mcg/min, RL 125 cc/hr, positive 1.8 fluid balance, feels better   Interim History / Subjective:    Objective    Blood pressure (!) 87/59, pulse (!) 120, temperature 98.3 F (36.8 C), temperature source Oral, resp. rate (!) 24, height 5\' 3"  (1.6 m), weight 42.7 kg, last menstrual period 10/29/2023, SpO2 96%.        Intake/Output Summary (Last 24 hours) at 12/24/2023 1355 Last data filed at 12/24/2023 1325 Gross per 24 hour  Intake 3082.38 ml  Output 515 ml  Net 2567.38 ml   Filed Weights   12/23/23 1415 12/24/23 0233  Weight: 43.8 kg 42.7 kg    Examination: General: alert, oriented x4, and mild resp distress. NRM 15 L, SpO2 94%  HENT: PERL, normal pharynx and oral mucosa. No LNE or thyromegaly. No JVD Lungs: symmetrical air entry bilaterally. Less left crackles. No wheezing Cardiovascular: NL S1/S2. No m/g/r Abdomen: mild distension. No tenderness Extremities: no edema. Symmetrical  Neuro: nonfocal    Resolved Hospital Problem list     Assessment & Plan:  Aspiration PNA leading to acute hypoxic resp failure Septic shock vs chronic orthostatic hypotension (on Florinef  & Midodrine ) -Unasyn, day#2 -MRSA screening: negative -Sputum Cx -  Bcx2: pending  -PCT: high -Duoneb  -I/S -CVC/a-line (right femoral area): d/c once PICC is inserted  -Levophed  for MAP >65 mmHg -Midodrine    -12/24/2023, Echo: EF 60%    HypoCa: replaced    HypoK: -Replace    AKI: better  -reduce IVF -Avoid nephrotoxic agents -Serial labs -I/O chart   GERD/esophagitis (on PPI/H2B) -Continue PPI bid -Reglan  -Miralax     Fe def anemia Moderate PCM Vit B12 and folate: NL -IV Fe -resume heart healthy diet   -Glycemic control   Chronic idiopathic constipation/chronic intestinal pseudoobstruction  -d/c NGT -Small frequent portions of diet   -Reglan   -Consult GI surgery: called but they had nothing extra to offer for now -GI on board: appreciated and noted their recommendations   Best Practice (right click and "Reselect all SmartList Selections" daily)   Diet/type: diet as above DVT prophylaxis prophylactic heparin  Pressure ulcer(s): N/A GI prophylaxis: PPI Lines: Central line, will d/c femoral CVC and a-line  Foley:  N/A Code Status:  full code Last date of multidisciplinary goals of care discussion []   Labs   CBC: Recent Labs  Lab 12/22/23 1048 12/23/23 1441 12/23/23 1447 12/23/23 1449 12/24/23 0226  WBC 12.6*  --  5.8  --  26.2*  NEUTROABS 8.3*  --  4.2  --   --   HGB 8.7* 11.2* 9.9* 9.5* 8.4*  HCT 27.3* 33.0* 33.0* 28.0* 26.4*  MCV 86.4  --  92.2  --  86.6  PLT 745.0*  --  731*  --  651*    Basic Metabolic Panel: Recent Labs  Lab 12/22/23 1048 12/23/23 1441 12/23/23 1447 12/23/23 1449 12/24/23 0226  NA 134* 132* 135 135 137  K 4.0 4.7 5.1 4.4 3.3*  CL 106 113* 109  --  110  CO2 20  --  14*  --  15*  GLUCOSE 86 103* 107*  --  129*  BUN 30* 47* 50*  --  47*  CREATININE 0.56 1.00 1.17*  --  1.03*  CALCIUM  8.6  --  8.3*  --  7.8*  MG  --   --  2.5*  --  2.1  PHOS  --   --  6.4*  --  5.1*   GFR: Estimated Creatinine Clearance: 47.5 mL/min (A) (by C-G formula based on SCr of 1.03 mg/dL (H)). Recent Labs  Lab 12/22/23 1048 12/23/23 1442 12/23/23 1447 12/24/23 0226  PROCALCITON  --   --  0.50  --   WBC 12.6*  --  5.8 26.2*  LATICACIDVEN  --  1.5  --   --     Liver Function Tests: Recent  Labs  Lab 12/22/23 1048 12/23/23 1447  AST 14 25  ALT 33 29  ALKPHOS 70 73  BILITOT 0.5 1.6*  PROT 6.3 6.1*  ALBUMIN  2.9* 2.3*   No results for input(s): "LIPASE", "AMYLASE" in the last 168 hours. No results for input(s): "AMMONIA" in the last 168 hours.  ABG    Component Value Date/Time   PHART 7.35 10/29/2023 1828   PCO2ART 45 10/29/2023 1828   PO2ART 275 (H) 10/29/2023 1828   HCO3 15.1 (L) 12/23/2023 1449   TCO2 16 (L) 12/23/2023 1449   ACIDBASEDEF 9.0 (H) 12/23/2023 1449   O2SAT 58 12/23/2023 1449     Coagulation Profile: Recent Labs  Lab 12/23/23 1447  INR 1.5*    Cardiac Enzymes: No results for input(s): "CKTOTAL", "CKMB", "CKMBINDEX", "TROPONINI" in the last 168 hours.  HbA1C: Hgb A1c MFr Bld  Date/Time Value Ref Range Status  07/28/2013 04:30 AM 5.1 <5.7 % Final    Comment:    (NOTE)                                                                       According to the ADA Clinical Practice Recommendations for 2011, when HbA1c is used as a screening test:  >=6.5%   Diagnostic of Diabetes Mellitus           (if abnormal result is confirmed) 5.7-6.4%   Increased risk of developing Diabetes Mellitus References:Diagnosis and Classification of Diabetes Mellitus,Diabetes Care,2011,34(Suppl 1):S62-S69 and Standards of Medical Care in         Diabetes - 2011,Diabetes Care,2011,34 (Suppl 1):S11-S61.    CBG: Recent Labs  Lab 12/23/23 1918 12/23/23 2334 12/24/23 0325 12/24/23 0818 12/24/23 1139  GLUCAP 102* 133* 127* 113* 119*    Review of Systems:   Review of Systems  Constitutional:  Positive for chills, malaise/fatigue and weight loss. Negative for diaphoresis and fever.  HENT:  Positive for sore throat. Negative for congestion, nosebleeds and sinus pain.   Respiratory:  Positive for cough, sputum production and shortness of breath. Negative for hemoptysis, wheezing and stridor.   Cardiovascular:  Negative for chest pain, palpitations, orthopnea,  claudication, leg swelling and PND.  Gastrointestinal:  Positive for abdominal pain, constipation, heartburn. Negative for blood in stool, diarrhea and melena.  Genitourinary:  Negative for dysuria, frequency and urgency.  Musculoskeletal:  Negative for myalgias.  Skin:  Negative for itching and rash.   Past Medical History:  She,  has a past medical history of Anxiety, Benign liver cyst, Chronic intestinal pseudo-obstruction, GERD (gastroesophageal reflux disease), History of UTI, and Nausea and vomiting (04/28/2020).   Surgical History:   Past Surgical History:  Procedure Laterality Date   BIOPSY  05/20/2020   Procedure: BIOPSY;  Surgeon: Celedonio Coil, MD;  Location: [facility];  Service: Endoscopy;;   CESAREAN SECTION     twice 2010, 2013   CHOLECYSTECTOMY     ESOPHAGOGASTRODUODENOSCOPY (EGD) WITH PROPOFOL  N/A 05/20/2020   Procedure: ESOPHAGOGASTRODUODENOSCOPY (EGD) WITH PROPOFOL ;  Surgeon: Celedonio Coil, MD;  Location: [facility];  Service: Endoscopy;  Laterality: N/A;   Esure  ~2015   LAPAROTOMY N/A 10/29/2023   Procedure: LAPAROTOMY, EXPLORATORY; RIGHT EXTENDED COLECTOMY;  Surgeon: Adalberto Acton, MD;  Location: [facility];  Service: General;  Laterality: N/A;   LAPAROTOMY N/A 10/31/2023   Procedure: RE-EXPLORATION OF ABDOMEN, CHOLECYSTECTOMY, ILEOCOLONIC ANASTOMOSIS;  Surgeon: Aldean Hummingbird, MD;  Location: [facility];  Service: General;  Laterality: N/A;     Social History:   reports that she has never smoked. She has never used smokeless tobacco. She reports that she does not drink alcohol and does not use drugs.   Family History:  Her family history includes CAD in her father; Diabetes in her brother and father; Hypertension in her father. There is no history of Colon cancer, Breast cancer, or Adrenal disorder.   Allergies Allergies  Allergen Reactions   Tetracyclines & Related Swelling    Swelling in spine; required spinal tap.    Erythromycin Other (See Comments)     Swelling in spine; required spinal tap   Raspberry Rash   Sulfa  Antibiotics Rash    hives   Sulfonamide Derivatives Rash    Reaction to cream     Home Medications  Prior to Admission medications   Medication Sig Start Date End Date Taking? Authorizing Provider  acetaminophen  (TYLENOL ) 500 MG tablet Take 2 tablets (1,000 mg total) by mouth every 6 (six) hours as needed for mild pain (pain score 1-3), fever or headache. 11/15/23  Yes Noland Battles H, PA-C  cyanocobalamin  (,VITAMIN B-12,) 1000 MCG/ML injection Inject 1,000 mcg into the muscle every 30 (thirty) days.   Yes [provider]  megestrol  (MEGACE  ES) 625 MG/5ML suspension SHAKE LIQUID AND TAKE 5 ML(625 MG) BY MOUTH DAILY 10/24/23  Yes Tabori, Katherine E, MD  midodrine  (PROAMATINE ) 5 MG tablet Take 1 tablet (5 mg total) by mouth 3 (three) times daily with meals. 03/17/23  Yes Maudine Sos, MD  montelukast  (SINGULAIR ) 10 MG tablet Take 1 tablet (10 mg total) by mouth daily. 06/01/23  Yes Tabori, Katherine E, MD  Multiple Vitamin (MULTIVITAMIN WITH MINERALS) TABS tablet Take 1 tablet by mouth daily. Patient taking differently: Take 1 tablet by mouth every evening. 05/29/20  Yes Armenta Landau, MD  ondansetron  (ZOFRAN -ODT) 4 MG disintegrating tablet Take 1 tablet (4 mg total) by mouth every 8 (eight) hours as needed for nausea or vomiting. 12/08/23  Yes Jess Morita, MD  pantoprazole  (PROTONIX ) 40 MG tablet Take 1 tablet (40 mg total) by mouth daily. Patient taking differently: Take 40 mg by mouth 2 (two) times daily. 12/13/23  Yes Zackowski, Scott, MD  polyethylene glycol (MIRALAX  / GLYCOLAX ) 17 g packet Take 17 g by mouth 3 (three) times daily. Patient taking differently: Take 17 g by mouth 3 (three) times daily as needed for moderate constipation. 05/28/20  Yes Armenta Landau, MD  traZODone  (DESYREL ) 50 MG tablet Take 0.5-1 tablets (25-50 mg total) by mouth at bedtime as needed for sleep. Patient taking  differently: Take 50 mg by mouth at bedtime as needed for sleep. 12/08/23  Yes Tabori, Katherine E, MD  Vitamin D , Ergocalciferol , (DRISDOL ) 1.25 MG (50000 UNIT) CAPS capsule Take 50,000 Units by mouth once a week. 11/02/23  Yes [provider]  famotidine  (PEPCID ) 40 MG/5ML suspension Take 5 mLs (40 mg total) by mouth daily. Patient not taking: Reported on 12/23/2023 12/12/23   Tabori, Katherine E, MD  fludrocortisone  (FLORINEF ) 0.1 MG tablet TAKE 1 TABLET(0.1 MG) BY MOUTH TWICE DAILY Patient not taking: Reported on 12/23/2023 07/25/23   Tabori, Katherine E, MD  potassium chloride  SA (KLOR-CON  M) 20 MEQ tablet Take 1 tablet (20 mEq total) by mouth daily. Patient not taking: Reported on 12/23/2023 12/13/23   Zackowski, Scott, MD  prochlorperazine (COMPAZINE) 25 MG suppository Place 25 mg rectally every 12 (twelve) hours as needed for nausea or vomiting. Patient not taking: Reported on 12/23/2023    [provider]     Critical care time: 40 min    Madelynn Schilder, MD [facility]

## 2023-12-25 ENCOUNTER — Inpatient Hospital Stay (HOSPITAL_COMMUNITY)

## 2023-12-25 DIAGNOSIS — A419 Sepsis, unspecified organism: Secondary | ICD-10-CM | POA: Diagnosis not present

## 2023-12-25 DIAGNOSIS — J690 Pneumonitis due to inhalation of food and vomit: Secondary | ICD-10-CM | POA: Diagnosis not present

## 2023-12-25 DIAGNOSIS — R6521 Severe sepsis with septic shock: Secondary | ICD-10-CM | POA: Diagnosis not present

## 2023-12-25 DIAGNOSIS — J9601 Acute respiratory failure with hypoxia: Secondary | ICD-10-CM | POA: Diagnosis not present

## 2023-12-25 LAB — RENAL FUNCTION PANEL
Albumin: 1.6 g/dL — ABNORMAL LOW (ref 3.5–5.0)
Anion gap: 7 (ref 5–15)
BUN: 23 mg/dL — ABNORMAL HIGH (ref 6–20)
CO2: 21 mmol/L — ABNORMAL LOW (ref 22–32)
Calcium: 8.1 mg/dL — ABNORMAL LOW (ref 8.9–10.3)
Chloride: 113 mmol/L — ABNORMAL HIGH (ref 98–111)
Creatinine, Ser: 0.43 mg/dL — ABNORMAL LOW (ref 0.44–1.00)
GFR, Estimated: >60 mL/min (ref >60)
Glucose, Bld: 146 mg/dL — ABNORMAL HIGH (ref 70–99)
Phosphorus: 2.5 mg/dL (ref 2.5–4.6)
Potassium: 3.2 mmol/L — ABNORMAL LOW (ref 3.5–5.1)
Sodium: 141 mmol/L (ref 135–145)

## 2023-12-25 LAB — CBC
HCT: 22.2 % — ABNORMAL LOW (ref 36.0–46.0)
Hemoglobin: 7.1 g/dL — ABNORMAL LOW (ref 12.0–15.0)
MCH: 27.4 pg (ref 26.0–34.0)
MCHC: 32 g/dL (ref 30.0–36.0)
MCV: 85.7 fL (ref 80.0–100.0)
Platelets: 471 10*3/uL — ABNORMAL HIGH (ref 150–400)
RBC: 2.59 MIL/uL — ABNORMAL LOW (ref 3.87–5.11)
RDW: 14.8 % (ref 11.5–15.5)
WBC: 16.9 10*3/uL — ABNORMAL HIGH (ref 4.0–10.5)
nRBC: 0 % (ref 0.0–0.2)

## 2023-12-25 LAB — GLUCOSE, CAPILLARY
Glucose-Capillary: 128 mg/dL — ABNORMAL HIGH (ref 70–99)
Glucose-Capillary: 140 mg/dL — ABNORMAL HIGH (ref 70–99)
Glucose-Capillary: 129 mg/dL — ABNORMAL HIGH (ref 70–99)
Glucose-Capillary: 98 mg/dL (ref 70–99)
Glucose-Capillary: 125 mg/dL — ABNORMAL HIGH (ref 70–99)
Glucose-Capillary: 122 mg/dL — ABNORMAL HIGH (ref 70–99)

## 2023-12-25 LAB — MAGNESIUM: Magnesium: 1.9 mg/dL (ref 1.7–2.4)

## 2023-12-25 MED ORDER — SODIUM CHLORIDE 0.9% FLUSH
10.0000 mL | INTRAVENOUS | Status: DC | PRN
  Administered 2023-12-27 – 2023-12-28 (×2): 10 mL
  Administered 2023-12-28: 20 mL

## 2023-12-25 MED ORDER — POTASSIUM PHOSPHATES 15 MMOLE/5ML IV SOLN
15.0000 mmol | Freq: Once | INTRAVENOUS | Status: AC
  Administered 2023-12-25: 15 mmol via INTRAVENOUS
  Filled 2023-12-25: qty 5

## 2023-12-25 MED ORDER — HEPARIN SODIUM (PORCINE) 5000 UNIT/ML IJ SOLN
5000.0000 [IU] | Freq: Three times a day (TID) | INTRAMUSCULAR | Status: DC
Start: 2023-12-25 — End: 2024-01-03
  Administered 2023-12-25 – 2024-01-03 (×27): 5000 [IU] via SUBCUTANEOUS
  Filled 2023-12-25 (×27): qty 1

## 2023-12-25 MED ORDER — TRAVASOL 10 % IV SOLN
INTRAVENOUS | Status: AC
Start: 2023-12-25 — End: 2023-12-26
  Filled 2023-12-25: qty 792

## 2023-12-25 MED ORDER — ONDANSETRON HCL 4 MG/2ML IJ SOLN
4.0000 mg | Freq: Four times a day (QID) | INTRAMUSCULAR | Status: DC | PRN
  Administered 2023-12-25 – 2023-12-29 (×8): 4 mg via INTRAVENOUS
  Filled 2023-12-25 (×9): qty 2

## 2023-12-25 MED ORDER — FLEET ENEMA RE ENEM
1.0000 | ENEMA | Freq: Once | RECTAL | Status: AC
  Administered 2023-12-25: 1 via RECTAL
  Filled 2023-12-25: qty 1

## 2023-12-25 MED ORDER — METOCLOPRAMIDE HCL 5 MG/ML IJ SOLN
10.0000 mg | Freq: Three times a day (TID) | INTRAMUSCULAR | Status: DC
  Administered 2023-12-25 – 2024-01-03 (×27): 10 mg via INTRAVENOUS
  Filled 2023-12-25 (×27): qty 2

## 2023-12-25 MED ORDER — PANTOPRAZOLE SODIUM 40 MG IV SOLR
40.0000 mg | Freq: Two times a day (BID) | INTRAVENOUS | Status: DC
  Administered 2023-12-25 – 2023-12-27 (×6): 40 mg via INTRAVENOUS
  Filled 2023-12-25 (×6): qty 10

## 2023-12-25 MED ORDER — MAGNESIUM SULFATE IN D5W 1-5 GM/100ML-% IV SOLN
1.0000 g | Freq: Once | INTRAVENOUS | Status: AC
  Administered 2023-12-25: 1 g via INTRAVENOUS
  Filled 2023-12-25: qty 100

## 2023-12-25 MED ORDER — SODIUM CHLORIDE 0.9% FLUSH
10.0000 mL | Freq: Two times a day (BID) | INTRAVENOUS | Status: DC
  Administered 2023-12-25: 10 mL
  Administered 2023-12-25: 30 mL
  Administered 2023-12-26: 40 mL
  Administered 2023-12-26: 10 mL
  Administered 2023-12-27: 40 mL
  Administered 2023-12-27 – 2024-01-03 (×14): 10 mL

## 2023-12-25 MED ORDER — INSULIN ASPART 100 UNIT/ML IJ SOLN
2.0000 [IU] | INTRAMUSCULAR | Status: DC
  Administered 2023-12-25 (×3): 2 [IU] via SUBCUTANEOUS
  Administered 2023-12-26: 4 [IU] via SUBCUTANEOUS
  Administered 2023-12-26 (×2): 2 [IU] via SUBCUTANEOUS

## 2023-12-25 MED ORDER — SODIUM CHLORIDE 0.9 % IV SOLN: INTRAVENOUS | Status: AC | PRN

## 2023-12-25 MED ORDER — IOHEXOL 350 MG/ML SOLN
75.0000 mL | Freq: Once | INTRAVENOUS | Status: AC | PRN
  Administered 2023-12-25: 75 mL via INTRAVENOUS

## 2023-12-25 MED ORDER — POTASSIUM CHLORIDE 10 MEQ/100ML IV SOLN
10.0000 meq | INTRAVENOUS | Status: DC
  Filled 2023-12-25: qty 100

## 2023-12-25 MED ORDER — POTASSIUM CHLORIDE 10 MEQ/50ML IV SOLN
10.0000 meq | INTRAVENOUS | Status: AC
  Administered 2023-12-25 (×6): 10 meq via INTRAVENOUS
  Filled 2023-12-25 (×6): qty 50

## 2023-12-25 NOTE — Progress Notes (Signed)
 15 f NGT placed in left nare @ 55 cm and taped with Josh, RN as 2nd RN at bedside. Portable is also at bedside to do KUB. Will verify placement via imaging. Patient lying in bed, states that she "feels better" now. Will continue to follow care plan.

## 2023-12-25 NOTE — Progress Notes (Signed)
 Peripherally Inserted Central Catheter Placement  The IV Nurse has discussed with the patient and/or persons authorized to consent for the patient, the purpose of this procedure and the potential benefits and risks involved with this procedure.  The benefits include less needle sticks, lab draws from the catheter, and the patient may be discharged home with the catheter. Risks include, but not limited to, infection, bleeding, blood clot (thrombus formation), and puncture of an artery; nerve damage and irregular heartbeat and possibility to perform a PICC exchange if needed/ordered by physician.  Alternatives to this procedure were also discussed.  Bard Power PICC patient education guide, fact sheet on infection prevention and patient information card has been provided to patient /or left at bedside.    PICC Placement Documentation  PICC Triple Lumen 12/25/23 Left Basilic 38 cm 0 cm (Active)  Indication for Insertion or Continuance of Line Vasoactive infusions;Administration of hyperosmolar/irritating solutions (i.e. TPN, Vancomycin, etc.) 12/25/23 1125  Exposed Catheter (cm) 0 cm 12/25/23 1125  Site Assessment Clean, Dry, Intact 12/25/23 1125  Lumen #1 Status Saline locked;Blood return noted;Flushed 12/25/23 1125  Lumen #2 Status Flushed;Saline locked;Blood return noted 12/25/23 1125  Lumen #3 Status Flushed;Saline locked;Blood return noted 12/25/23 1125  Dressing Type Transparent;Securing device 12/25/23 1125  Dressing Status Clean, Dry, Intact;Antimicrobial disc/dressing in place 12/25/23 1125  Line Care Connections checked and tightened 12/25/23 1125  Line Adjustment (NICU/IV Team Only) No 12/25/23 1125  Dressing Intervention New dressing;Adhesive placed at insertion site (IV team only);Adhesive placed around edges of dressing (IV team/ICU RN only) 12/25/23 1125  Dressing Change Due 01/01/24 12/25/23 1125       Allegra Arch 12/25/2023, 11:27 AM

## 2023-12-25 NOTE — Progress Notes (Addendum)
 PHARMACY - TOTAL PARENTERAL NUTRITION CONSULT NOTE   Indication: Prolonged ileus  Patient Measurements: Height: 5\' 3"  (160 cm) Weight: 42.7 kg (94 lb 2.2 oz) IBW/kg (Calculated) : 52.4 TPN AdjBW (KG): 43.8 Body mass index is 16.68 kg/m. Usual Weight: ~110 lbs  Assessment:  43 yo W with nausea and vomiting for 2 weeks with at least 12 lbs weight loss due to poor PO intake. In March 2025, patient had colon perforation, pericolic abscess, diverticulitis, peritonitis, and septic shock s/p ex lap and right colectomy followed by ileocolic anastomosis and cholecystectomy.  Patient required TPN 3/18- 3/31 due to post-op ileus. CT on 4/29 was suspicious for ileus. Patient had an outpatient EGD 12/23/23 complicated by aspiration and hypotension. Work up for N/V ongoing. Pharmacy consulted for TPN.   Glucose / Insulin : CBG 107-146 since TPN initiation, 2u SSI Electrolytes: Na 141 from 137, K 3.2 from 3.3 (s/p 60 mEq outside of TPN), Cl 113, CO2 21, CoCa 9.9, P 2.5 (from 5.1), Mg 1.9 Possible ongoing refeeding based on K & Phos Renal: Scr at baseline (0.4 - 0.6), BUN 23 (from 47) Hepatic: 5/9 albumin  2.3, AST/ALT 25/29, Tbili 1.6 Intake / Output; MIVF:  1060 mL UOP + 5 unmeasured, 550 mL emesis Net +3 L; LBM unknown Reglan  5/9 >>  GI imaging: 4/29 CT: Mild-to-moderate diffuse small bowel dilatation, without definite transition point or significant interval change, suspicious for ileus. 5/9 KUB: dilated small bowel  GI procedures: none this admit  Central access: CVC 5/9  TPN start date: 5/10   Nutritional Goals: Goal TPN rate is 60 mL/hr (55g/L AA, 15% dextrose , 32 g/L lipids) provides 79 g of protein and 1511 kcals per day  RD Assessment: Estimated Needs Total Energy Estimated Needs: 1500-1600 kcals Total Protein Estimated Needs: 75-85 g Total Fluid Estimated Needs: 1.5 L  Current Nutrition:  NPO + TPN  Plan:  Advance TPN at 60 mL/hr at 1800, goal rate providing 100% of needs   Electrolytes in TPN: decrease Na 25 mEq/L, K 25 mEq/L (18 > 36 mEq in total), Ca 5 mEq/L, Mg 5 mEq/L, and increase Phos 10 mmol/L (6 > 14 mmol in total). Max acetate KCl 10 mEq IV x6 ordered by eLink Give additional 15 mmol KPhos Monitor refeeding lytes Add standard MVI and trace elements to TPN No chromium due to shortage Continue Sensitive q4h SSI and adjust as needed  Monitor TPN labs on Mon/Thurs, daily x 3 days upon initiation and PRN - next labs 5/12 AM  Heddy Liverpool, PharmD PGY2 Critical Care Pharmacy Resident Please check AMION for all [facility] Pharmacy phone numbers After 10:00 PM, call Main Pharmacy [phone]

## 2023-12-25 NOTE — Progress Notes (Signed)
 Patient started to vomit brownish/yellow, malodorous feculent like material. Estimated emesis is 500 mL's. Dr. Rito Chess notified and orders placed for NGT for decompression and repeat KUB. Patient given zofran  and made n.p.o. for now.

## 2023-12-25 NOTE — Progress Notes (Signed)
 [facility] Gastroenterology Progress Note  Stacy [name] 43 y.o. 30-Dec-1980  CC: Nausea, vomiting, poor oral intake, pneumonia   Subjective: Patient seen and examined at bedside.  Yesterday's events noted.  Patient had large amount of vomiting yesterday.  Was complaining of belching and bloating.  NG tube was placed for decompression.  Started on TPN.  Still no bowel movement.  ROS : Negative for bleeding.   Objective: Vital signs in last 24 hours: Vitals:   12/25/23 0700 12/25/23 0806  BP:    Pulse: (!) 102   Resp: 20   Temp:  98.9 F (37.2 C)  SpO2: 100%     Physical Exam:  Resting in the bed, not in acute distress.  NG tube in place.  Multiple lines in place.  Abdomen is distended with hypoactive bowel sounds.  No peritoneal signs.  Mood and affect normal but somewhat concerned.  Alert and oriented x 3.  Lab Results: Recent Labs    12/24/23 0226 12/25/23 0444  NA 137 141  K 3.3* 3.2*  CL 110 113*  CO2 15* 21*  GLUCOSE 129* 146*  BUN 47* 23*  CREATININE 1.03* 0.43*  CALCIUM  7.8* 8.1*  MG 2.1 1.9  PHOS 5.1* 2.5   Recent Labs    12/23/23 1447 12/25/23 0444  AST 25  --   ALT 29  --   ALKPHOS 73  --   BILITOT 1.6*  --   PROT 6.1*  --   ALBUMIN  2.3* 1.6*   Recent Labs    12/23/23 1447 12/23/23 1449 12/24/23 0226 12/25/23 0444  WBC 5.8  --  26.2* 16.9*  NEUTROABS 4.2  --   --   --   HGB 9.9*   < > 8.4* 7.1*  HCT 33.0*   < > 26.4* 22.2*  MCV 92.2  --  86.6 85.7  PLT 731*  --  651* 471*   < > = values in this interval not displayed.   Recent Labs    12/23/23 1447  LABPROT 18.5*  INR 1.5*      Assessment/Plan: -Aspiration pneumonia with acute hypoxic respiratory failure. 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  Now on  TPN.   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 -Currently on NG decompression and TPN. -Continue other supportive care with antibiotics and Reglan  and MiraLAX . -add  one-time fleets enema -GI will follow     Felecia Hopper MD, FACP 12/25/2023, 11:15 AM  Contact #  [phone]

## 2023-12-25 NOTE — Progress Notes (Signed)
 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 She was sent to ED for eval. Currently, she is on NRM 15 L, SpO2 94%, having chills, SOB, tachycardic, and tachypneic. She has N/V for 2 weeks and underwent eval in ED one 10 days ago with CT abd/pelvis:  Increased diffuse wall thickening of visualized distal thoracic esophagus, consistent with esophagitis. Mild-to-moderate diffuse small bowel dilatation, without definite transition point or significant interval change, suspicious for ileus. Mild improvement in diffuse small bowel wall thickening, suggesting improving enteritis. No evidence of abscess.   Her last BM was 4 days ago, soft. Denied f/c/r before EGD. Denied abd pain or worsening distension. Compliant with meds. No alcohol drinking, smoking, or illicit drug use. She gained after discharge but in the last 2 weeks, she has poor intake.    In October 29, 2023, she has colon perforation, pericolic abscess, diverticulitis, septic shock due to peritonitis, s/p ex lap and right colectomy, followed by ex lap with primary ileocolonic anastomosis and cholecystectomy. She developed post-operative Ileus, and she was placed on TPN for almost 10 days  before resuming oral intake.   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 5/10: On Levophed  3 mcg/min, RL 125 cc/hr, positive 1.8 fluid balance, feels better 5/11: N/V yesterday evening after eating and NGT had to be re inserted, NGT output in 50 cc only. Good UOP. Positive 1.3 L fluid balance in the last 24 hrs. Off Levophed . On TPN. NPO. On 12 L [place]. No BM or passing gas   Interim History / Subjective:    Objective    Blood pressure 118/84, pulse (!) 102, temperature 98.2 F (36.8 C), temperature source Oral, resp. rate 20, height 5\' 3"  (1.6 m), weight 42.7 kg, last menstrual period 10/29/2023, SpO2 100%.        Intake/Output Summary (Last 24 hours) at 12/25/2023 1203 Last data filed at 12/25/2023 0845 Gross per 24 hour  Intake 1651.08 ml  Output 1435 ml  Net 216.08 ml   Filed Weights   12/23/23 1415 12/24/23 0233 12/25/23 0305  Weight: 43.8 kg 42.7 kg 42.7 kg    Examination: General: alert, oriented x4, and mild resp distress. Roland 12 L, SpO2 96%  HENT: PERL, normal pharynx and oral mucosa. No LNE or thyromegaly. No JVD Lungs: symmetrical air entry bilaterally. Left crackles. No wheezing Cardiovascular: NL S1/S2. No m/g/r Abdomen: more distension. No tenderness. +BS  Extremities: no edema. Symmetrical  Neuro: nonfocal   Right femoral CVC and a-line  Resolved Hospital Problem list     Assessment & Plan:  Aspiration PNA leading to acute hypoxic resp failure Septic shock vs chronic orthostatic hypotension (on Florinef  &  Midodrine ) -Unasyn, day#3 -MRSA screening: negative -Sputum Cx -Bcx2: pending  -PCT: high -Duoneb  -I/S -CVC/a-line (right femoral area): d/c once PICC is inserted today  -12/24/2023, Echo: EF 60%    HypoK: -Replace    AKI: resolved  -On TPN -Serial labs -I/O chart   GERD/esophagitis (on PPI/H2B) -Continue PPI bid -Increase Reglan , allergic to erythromycin  -Miralax   -Fleet Enema x1 per GI   Fe def anemia Moderate PCM Vit B12 and folate: NL -IV Fe, s/p two doses  -Glycemic control   Chronic idiopathic constipation/chronic intestinal pseudoobstruction  -NGT to suction  -CT abd/pelvis w/wo contrast  -Consult GI surgery: called but they had nothing extra to offer for now -GI on board: appreciated and noted their recommendations   Best Practice (right click and "Reselect all SmartList Selections" daily)   Diet/type: TPN DVT prophylaxis prophylactic heparin  Pressure ulcer(s): N/A GI prophylaxis: PPI Lines: Central line, will d/c femoral CVC and a-line  Foley:  N/A Code Status:  full code Last date of multidisciplinary goals of care discussion []   Labs   CBC: Recent Labs  Lab 12/22/23 1048 12/23/23 1441 12/23/23 1447 12/23/23 1449 12/24/23 0226 12/25/23 0444  WBC 12.6*  --  5.8  --  26.2* 16.9*  NEUTROABS 8.3*  --  4.2  --   --   --   HGB 8.7* 11.2* 9.9* 9.5* 8.4* 7.1*  HCT 27.3* 33.0* 33.0* 28.0* 26.4* 22.2*  MCV 86.4  --  92.2  --  86.6 85.7  PLT 745.0*  --  731*  --  651* 471*    Basic Metabolic Panel: Recent Labs  Lab 12/22/23 1048 12/23/23 1441 12/23/23 1447 12/23/23 1449 12/24/23 0226 12/25/23 0444  NA 134* 132* 135 135 137 141  K 4.0 4.7 5.1 4.4 3.3* 3.2*  CL 106 113* 109  --  110 113*  CO2 20  --  14*  --  15* 21*  GLUCOSE 86 103* 107*  --  129* 146*  BUN 30* 47* 50*  --  47* 23*  CREATININE 0.56 1.00 1.17*  --  1.03* 0.43*  CALCIUM  8.6  --  8.3*  --  7.8* 8.1*  MG  --   --  2.5*  --  2.1 1.9  PHOS  --   --  6.4*  --  5.1* 2.5    GFR: Estimated Creatinine Clearance: 61.1 mL/min (A) (by C-G formula based on SCr of 0.43 mg/dL (L)). Recent Labs  Lab 12/22/23 1048 12/23/23 1442 12/23/23 1447 12/24/23 0226 12/25/23 0444  PROCALCITON  --   --  0.50  --   --   WBC 12.6*  --  5.8 26.2* 16.9*  LATICACIDVEN  --  1.5  --   --   --     Liver Function Tests: Recent Labs  Lab 12/22/23 1048 12/23/23 1447 12/25/23 0444  AST 14 25  --   ALT 33 29  --   ALKPHOS 70 73  --   BILITOT 0.5 1.6*  --   PROT 6.3 6.1*  --   ALBUMIN  2.9* 2.3* 1.6*   No results for input(s): "LIPASE", "AMYLASE" in the last 168 hours. No results for input(s): "AMMONIA" in the last 168 hours.  ABG  Component Value Date/Time   PHART 7.35 10/29/2023 1828   PCO2ART 45 10/29/2023 1828   PO2ART 275 (H) 10/29/2023 1828   HCO3 15.1 (L) 12/23/2023 1449   TCO2 16 (L) 12/23/2023 1449   ACIDBASEDEF 9.0 (H) 12/23/2023 1449   O2SAT 58 12/23/2023 1449     Coagulation Profile: Recent Labs  Lab 12/23/23 1447  INR 1.5*    Cardiac Enzymes: No results for input(s): "CKTOTAL", "CKMB", "CKMBINDEX", "TROPONINI" in the last 168 hours.  HbA1C: Hgb A1c MFr Bld  Date/Time Value Ref Range Status  07/28/2013 04:30 AM 5.1 <5.7 % Final    Comment:    (NOTE)                                                                       According to the ADA Clinical Practice Recommendations for 2011, when HbA1c is used as a screening test:  >=6.5%   Diagnostic of Diabetes Mellitus           (if abnormal result is confirmed) 5.7-6.4%   Increased risk of developing Diabetes Mellitus References:Diagnosis and Classification of Diabetes Mellitus,Diabetes Care,2011,34(Suppl 1):S62-S69 and Standards of Medical Care in         Diabetes - 2011,Diabetes Care,2011,34 (Suppl 1):S11-S61.    CBG: Recent Labs  Lab 12/24/23 1956 12/24/23 2312 12/25/23 0326 12/25/23 0803 12/25/23 1138  GLUCAP 107* 146* 128* 140* 129*    Review of Systems:   Review of Systems   Constitutional:  Positive for chills, malaise/fatigue and weight loss. Negative for diaphoresis and fever.  HENT:  Positive for sore throat. Negative for congestion, nosebleeds and sinus pain.   Respiratory:  Positive for cough, sputum production and shortness of breath. Negative for hemoptysis, wheezing and stridor.   Cardiovascular:  Negative for chest pain, palpitations, orthopnea, claudication, leg swelling and PND.  Gastrointestinal:  Positive for abdominal pain, constipation, heartburn. Negative for blood in stool, diarrhea and melena.  Genitourinary:  Negative for dysuria, frequency and urgency.  Musculoskeletal:  Negative for myalgias.  Skin:  Negative for itching and rash.   Past Medical History:  She,  has a past medical history of Anxiety, Benign liver cyst, Chronic intestinal pseudo-obstruction, GERD (gastroesophageal reflux disease), History of UTI, and Nausea and vomiting (04/28/2020).   Surgical History:   Past Surgical History:  Procedure Laterality Date   BIOPSY  05/20/2020   Procedure: BIOPSY;  Surgeon: Celedonio Coil, MD;  Location: [facility];  Service: Endoscopy;;   CESAREAN SECTION     twice 2010, 2013   CHOLECYSTECTOMY     ESOPHAGOGASTRODUODENOSCOPY (EGD) WITH PROPOFOL  N/A 05/20/2020   Procedure: ESOPHAGOGASTRODUODENOSCOPY (EGD) WITH PROPOFOL ;  Surgeon: Celedonio Coil, MD;  Location: [facility];  Service: Endoscopy;  Laterality: N/A;   Esure  ~2015   LAPAROTOMY N/A 10/29/2023   Procedure: LAPAROTOMY, EXPLORATORY; RIGHT EXTENDED COLECTOMY;  Surgeon: Adalberto Acton, MD;  Location: [facility];  Service: General;  Laterality: N/A;   LAPAROTOMY N/A 10/31/2023   Procedure: RE-EXPLORATION OF ABDOMEN, CHOLECYSTECTOMY, ILEOCOLONIC ANASTOMOSIS;  Surgeon: Aldean Hummingbird, MD;  Location: [facility];  Service: General;  Laterality: N/A;     Social History:   reports that she has never smoked. She has never used smokeless tobacco. She reports that she does  not drink alcohol and  does not use drugs.   Family History:  Her family history includes CAD in her father; Diabetes in her brother and father; Hypertension in her father. There is no history of Colon cancer, Breast cancer, or Adrenal disorder.   Allergies Allergies  Allergen Reactions   Tetracyclines & Related Swelling    Swelling in spine; required spinal tap.    Erythromycin Other (See Comments)    Swelling in spine; required spinal tap   Raspberry Rash   Sulfa Antibiotics Rash    hives   Sulfonamide Derivatives Rash    Reaction to cream     Home Medications  Prior to Admission medications   Medication Sig Start Date End Date Taking? Authorizing Provider  acetaminophen  (TYLENOL ) 500 MG tablet Take 2 tablets (1,000 mg total) by mouth every 6 (six) hours as needed for mild pain (pain score 1-3), fever or headache. 11/15/23  Yes Noland Battles H, PA-C  cyanocobalamin  (,VITAMIN B-12,) 1000 MCG/ML injection Inject 1,000 mcg into the muscle every 30 (thirty) days.   Yes [provider]  megestrol  (MEGACE  ES) 625 MG/5ML suspension SHAKE LIQUID AND TAKE 5 ML(625 MG) BY MOUTH DAILY 10/24/23  Yes Tabori, Katherine E, MD  midodrine  (PROAMATINE ) 5 MG tablet Take 1 tablet (5 mg total) by mouth 3 (three) times daily with meals. 03/17/23  Yes Maudine Sos, MD  montelukast  (SINGULAIR ) 10 MG tablet Take 1 tablet (10 mg total) by mouth daily. 06/01/23  Yes Tabori, Katherine E, MD  Multiple Vitamin (MULTIVITAMIN WITH MINERALS) TABS tablet Take 1 tablet by mouth daily. Patient taking differently: Take 1 tablet by mouth every evening. 05/29/20  Yes Armenta Landau, MD  ondansetron  (ZOFRAN -ODT) 4 MG disintegrating tablet Take 1 tablet (4 mg total) by mouth every 8 (eight) hours as needed for nausea or vomiting. 12/08/23  Yes Jess Morita, MD  pantoprazole  (PROTONIX ) 40 MG tablet Take 1 tablet (40 mg total) by mouth daily. Patient taking differently: Take 40 mg by mouth 2 (two) times daily. 12/13/23  Yes  Zackowski, Scott, MD  polyethylene glycol (MIRALAX  / GLYCOLAX ) 17 g packet Take 17 g by mouth 3 (three) times daily. Patient taking differently: Take 17 g by mouth 3 (three) times daily as needed for moderate constipation. 05/28/20  Yes Armenta Landau, MD  traZODone  (DESYREL ) 50 MG tablet Take 0.5-1 tablets (25-50 mg total) by mouth at bedtime as needed for sleep. Patient taking differently: Take 50 mg by mouth at bedtime as needed for sleep. 12/08/23  Yes Tabori, Katherine E, MD  Vitamin D , Ergocalciferol , (DRISDOL ) 1.25 MG (50000 UNIT) CAPS capsule Take 50,000 Units by mouth once a week. 11/02/23  Yes [provider]  famotidine  (PEPCID ) 40 MG/5ML suspension Take 5 mLs (40 mg total) by mouth daily. Patient not taking: Reported on 12/23/2023 12/12/23   Tabori, Katherine E, MD  fludrocortisone  (FLORINEF ) 0.1 MG tablet TAKE 1 TABLET(0.1 MG) BY MOUTH TWICE DAILY Patient not taking: Reported on 12/23/2023 07/25/23   Tabori, Katherine E, MD  potassium chloride  SA (KLOR-CON  M) 20 MEQ tablet Take 1 tablet (20 mEq total) by mouth daily. Patient not taking: Reported on 12/23/2023 12/13/23   Zackowski, Scott, MD  prochlorperazine (COMPAZINE) 25 MG suppository Place 25 mg rectally every 12 (twelve) hours as needed for nausea or vomiting. Patient not taking: Reported on 12/23/2023    [provider]     Critical care time: 41 min    Madelynn Schilder, MD [facility]

## 2023-12-26 DIAGNOSIS — R6521 Severe sepsis with septic shock: Secondary | ICD-10-CM | POA: Diagnosis not present

## 2023-12-26 DIAGNOSIS — J690 Pneumonitis due to inhalation of food and vomit: Secondary | ICD-10-CM | POA: Diagnosis not present

## 2023-12-26 DIAGNOSIS — A419 Sepsis, unspecified organism: Secondary | ICD-10-CM | POA: Diagnosis not present

## 2023-12-26 DIAGNOSIS — J9601 Acute respiratory failure with hypoxia: Secondary | ICD-10-CM | POA: Diagnosis not present

## 2023-12-26 LAB — CALCIUM, IONIZED: Calcium, Ionized, Serum: 4.9 mg/dL (ref 4.5–5.6)

## 2023-12-26 LAB — COMPREHENSIVE METABOLIC PANEL WITH GFR
ALT: 16 U/L (ref 0–44)
AST: 11 U/L — ABNORMAL LOW (ref 15–41)
Albumin: <1.5 g/dL — ABNORMAL LOW (ref 3.5–5.0)
Alkaline Phosphatase: 58 U/L (ref 38–126)
Anion gap: 7 (ref 5–15)
BUN: 12 mg/dL (ref 6–20)
CO2: 24 mmol/L (ref 22–32)
Calcium: 7.6 mg/dL — ABNORMAL LOW (ref 8.9–10.3)
Chloride: 110 mmol/L (ref 98–111)
Creatinine, Ser: 0.39 mg/dL — ABNORMAL LOW (ref 0.44–1.00)
GFR, Estimated: >60 mL/min (ref >60)
Glucose, Bld: 145 mg/dL — ABNORMAL HIGH (ref 70–99)
Potassium: 3 mmol/L — ABNORMAL LOW (ref 3.5–5.1)
Sodium: 141 mmol/L (ref 135–145)
Total Bilirubin: 0.8 mg/dL (ref 0.0–1.2)
Total Protein: 4.3 g/dL — ABNORMAL LOW (ref 6.5–8.1)

## 2023-12-26 LAB — GLUCOSE, CAPILLARY
Glucose-Capillary: 136 mg/dL — ABNORMAL HIGH (ref 70–99)
Glucose-Capillary: 119 mg/dL — ABNORMAL HIGH (ref 70–99)
Glucose-Capillary: 151 mg/dL — ABNORMAL HIGH (ref 70–99)
Glucose-Capillary: 128 mg/dL — ABNORMAL HIGH (ref 70–99)
Glucose-Capillary: 101 mg/dL — ABNORMAL HIGH (ref 70–99)

## 2023-12-26 LAB — PHOSPHORUS: Phosphorus: 3.4 mg/dL (ref 2.5–4.6)

## 2023-12-26 LAB — POTASSIUM: Potassium: 3.5 mmol/L (ref 3.5–5.1)

## 2023-12-26 LAB — TRIGLYCERIDES: Triglycerides: 97 mg/dL (ref <150)

## 2023-12-26 LAB — MAGNESIUM: Magnesium: 1.6 mg/dL — ABNORMAL LOW (ref 1.7–2.4)

## 2023-12-26 MED ORDER — PHENOL 1.4 % MT LIQD
2.0000 | OROMUCOSAL | Status: DC | PRN
Start: 2023-12-26 — End: 2024-01-03
  Administered 2023-12-26 – 2023-12-28 (×2): 2 via OROMUCOSAL
  Filled 2023-12-26: qty 177

## 2023-12-26 MED ORDER — POTASSIUM CHLORIDE 10 MEQ/100ML IV SOLN
10.0000 meq | INTRAVENOUS | Status: AC
  Administered 2023-12-26 (×6): 10 meq via INTRAVENOUS
  Filled 2023-12-26 (×6): qty 100

## 2023-12-26 MED ORDER — MAGNESIUM SULFATE 2 GM/50ML IV SOLN
2.0000 g | Freq: Once | INTRAVENOUS | Status: AC
  Administered 2023-12-26: 2 g via INTRAVENOUS
  Filled 2023-12-26: qty 50

## 2023-12-26 MED ORDER — FLEET ENEMA RE ENEM
1.0000 | ENEMA | Freq: Once | RECTAL | Status: AC
  Administered 2023-12-26: 1 via RECTAL
  Filled 2023-12-26: qty 1

## 2023-12-26 MED ORDER — OXYCODONE HCL 5 MG PO TABS
5.0000 mg | ORAL_TABLET | ORAL | Status: DC | PRN
  Administered 2023-12-26 – 2023-12-28 (×4): 5 mg via ORAL
  Filled 2023-12-26 (×4): qty 1

## 2023-12-26 MED ORDER — MIDODRINE HCL 5 MG PO TABS
5.0000 mg | ORAL_TABLET | Freq: Three times a day (TID) | ORAL | Status: DC
  Administered 2023-12-26 – 2023-12-28 (×7): 5 mg via ORAL
  Filled 2023-12-26 (×7): qty 1

## 2023-12-26 MED ORDER — LIDOCAINE VISCOUS HCL 2 % MT SOLN
15.0000 mL | OROMUCOSAL | Status: AC | PRN
  Administered 2023-12-27 (×3): 15 mL via OROMUCOSAL
  Filled 2023-12-26 (×4): qty 15

## 2023-12-26 MED ORDER — TRAVASOL 10 % IV SOLN
INTRAVENOUS | Status: AC
  Filled 2023-12-26: qty 792

## 2023-12-26 NOTE — Progress Notes (Signed)
 eLink Physician-Brief Progress Note Patient Name: Stacy [name] DOB: 20-Feb-1981 MRN: [id number]   Date of Service  12/26/2023  HPI/Events of Note  Notified of patient request to review pain medications. Pt with abdominal pain and has orders for fentanyl  IV.    Pt also complaining of sore throat not relieved by chloraseptic spray.  eICU Interventions  Pt just given fentanyl  IV.  Change to PO oxycodone  PRN. Pt is tolerating clears at this time.  Viscous lidocaine  ordered as per request.      Intervention Category Intermediate Interventions: Pain - evaluation and management  Lanell Pinta 12/26/2023, 10:10 PM  12:45 AM Pt asking for medications for heartburn.  Pt with esophagitis and likely dysmotility as per GI note.   Plan> Give maalox and famotidine  now.

## 2023-12-26 NOTE — Progress Notes (Signed)
 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 She was sent to ED for eval. Currently, she is on NRM 15 L, SpO2 94%, having chills, SOB, tachycardic, and tachypneic. She has N/V for 2 weeks and underwent eval in ED one 10 days ago with CT abd/pelvis:  Increased diffuse wall thickening of visualized distal thoracic esophagus, consistent with esophagitis. Mild-to-moderate diffuse small bowel dilatation, without definite transition point or significant interval change, suspicious for ileus. Mild improvement in diffuse small bowel wall thickening, suggesting improving enteritis. No evidence of abscess.   Her last BM was 4 days ago, soft. Denied f/c/r before EGD. Denied abd pain or worsening distension. Compliant with meds. No alcohol drinking, smoking, or illicit drug use. She gained after discharge but in the last 2 weeks, she has poor intake.    In October 29, 2023, she has colon perforation, pericolic abscess, diverticulitis, septic shock due to peritonitis, s/p ex lap and right colectomy, followed by ex lap with primary ileocolonic anastomosis and cholecystectomy. She developed post-operative Ileus, and she was placed on TPN for almost 10 days  before resuming oral intake.   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 5/10: On Levophed  3 mcg/min, RL 125 cc/hr, positive 1.8 fluid balance, feels better 5/11: N/V yesterday evening after eating and NGT had to be re inserted, NGT output in 50 cc only. Good UOP. Positive 1.3 L fluid balance in the last 24 hrs. Off Levophed . On TPN. NPO. On 12 L Cut Off. No BM or passing gas  5/12: has BM today and she wants to eat. NGT 440 cc in the last 24 hrs. On [place] 5 L. On TPN  Interim History / Subjective:    Objective    Blood pressure 113/80, pulse (!) 111, temperature 98.1 F (36.7 C), temperature source Oral, resp. rate (!) 21, height 5\' 3"  (1.6 m), weight 48.9 kg, last menstrual period 10/29/2023, SpO2 92%.        Intake/Output Summary (Last 24 hours) at 12/26/2023 0909 Last data filed at 12/26/2023 0900 Gross per 24 hour  Intake 2550.64 ml  Output 1725 ml  Net 825.64 ml   Filed Weights   12/24/23 0233 12/25/23 0305 12/26/23 0422  Weight: 42.7 kg 42.7 kg 48.9 kg    Examination: General: alert, oriented x4, and mild resp distress. [place] 12 L, SpO2 96%  HENT: PERL, normal pharynx and oral mucosa. No LNE  or thyromegaly. No JVD Lungs: symmetrical air entry bilaterally. Left crackles. No wheezing Cardiovascular: NL S1/S2. No m/g/r Abdomen: less distension. No tenderness. +BS  Extremities: no edema. Symmetrical  Neuro: nonfocal   PICC line   Resolved Hospital Problem list     Assessment & Plan:  Aspiration PNA leading to acute hypoxic resp  failure Septic shock vs chronic orthostatic hypotension (on Florinef  & Midodrine ) -Unasyn, day#4 -MRSA screening: negative -Sputum Cx -Bcx2: pending  -PCT: high -Duoneb  -I/S -CVC/a-line (right femoral area): d/c 12/25/2023, PICC was inserted  -12/24/2023, Echo: EF 60%    HypoK/hypoMg: -Replace    AKI: resolved  -On TPN -Serial labs -I/O chart   GERD/esophagitis (on PPI/H2B) -Continue PPI bid -Increase Reglan , allergic to erythromycin  -Miralax   -s/p Fleet Enema x1 per GI (12/25/2023)   Fe def anemia Moderate PCM Vit B12 and folate: NL -IV Fe, s/p two doses  -Glycemic control   Chronic idiopathic constipation/chronic intestinal pseudoobstruction  -Clamp NGT and monitor before starting clear liquid diet  -CT abd/pelvis w/wo contrast: noted  -Consult GI surgery: called but they had nothing extra to offer for now -GI on board: appreciated and noted their recommendations  -Midline suture was removed   Best Practice (right click and "Reselect all SmartList Selections" daily)   Diet/type: TPN DVT prophylaxis prophylactic heparin  Pressure ulcer(s): N/A GI prophylaxis: PPI Lines: PICC line  Foley:  N/A Code Status:  full code Last date of multidisciplinary goals of care discussion []   Labs   CBC: Recent Labs  Lab 12/22/23 1048 12/23/23 1441 12/23/23 1447 12/23/23 1449 12/24/23 0226 12/25/23 0444  WBC 12.6*  --  5.8  --  26.2* 16.9*  NEUTROABS 8.3*  --  4.2  --   --   --   HGB 8.7* 11.2* 9.9* 9.5* 8.4* 7.1*  HCT 27.3* 33.0* 33.0* 28.0* 26.4* 22.2*  MCV 86.4  --  92.2  --  86.6 85.7  PLT 745.0*  --  731*  --  651* 471*    Basic Metabolic Panel: Recent Labs  Lab 12/22/23 1048 12/23/23 1441 12/23/23 1447 12/23/23 1449 12/24/23 0226 12/25/23 0444 12/26/23 0437  NA 134* 132* 135 135 137 141 141  K 4.0 4.7 5.1 4.4 3.3* 3.2* 3.0*  CL 106 113* 109  --  110 113* 110  CO2 20  --  14*  --  15* 21* 24  GLUCOSE 86 103* 107*  --  129* 146* 145*  BUN 30* 47*  50*  --  47* 23* 12  CREATININE 0.56 1.00 1.17*  --  1.03* 0.43* 0.39*  CALCIUM  8.6  --  8.3*  --  7.8* 8.1* 7.6*  MG  --   --  2.5*  --  2.1 1.9 1.6*  PHOS  --   --  6.4*  --  5.1* 2.5 3.4   GFR: Estimated Creatinine Clearance: 70 mL/min (A) (by C-G formula based on SCr of 0.39 mg/dL (L)). Recent Labs  Lab 12/22/23 1048 12/23/23 1442 12/23/23 1447 12/24/23 0226 12/25/23 0444  PROCALCITON  --   --  0.50  --   --   WBC 12.6*  --  5.8 26.2* 16.9*  LATICACIDVEN  --  1.5  --   --   --     Liver Function Tests: Recent Labs  Lab 12/22/23 1048 12/23/23 1447 12/25/23 0444 12/26/23 0437  AST 14 25  --  11*  ALT 33 29  --  16  ALKPHOS 70 73  --  58  BILITOT 0.5 1.6*  --  0.8  PROT 6.3 6.1*  --  4.3*  ALBUMIN  2.9* 2.3* 1.6* <1.5*   No results for input(s): "LIPASE", "AMYLASE" in the last 168 hours. No results for input(s): "AMMONIA" in the last 168 hours.  ABG    Component Value Date/Time   PHART 7.35 10/29/2023 1828   PCO2ART 45 10/29/2023 1828   PO2ART 275 (H) 10/29/2023 1828   HCO3 15.1 (L) 12/23/2023 1449   TCO2 16 (L) 12/23/2023 1449   ACIDBASEDEF 9.0 (H) 12/23/2023 1449   O2SAT 58 12/23/2023 1449     Coagulation Profile: Recent Labs  Lab 12/23/23 1447  INR 1.5*    Cardiac Enzymes: No results for input(s): "CKTOTAL", "CKMB", "CKMBINDEX", "TROPONINI" in the last 168 hours.  HbA1C: Hgb A1c MFr Bld  Date/Time Value Ref Range Status  07/28/2013 04:30 AM 5.1 <5.7 % Final    Comment:    (NOTE)                                                                       According to the ADA Clinical Practice Recommendations for 2011, when HbA1c is used as a screening test:  >=6.5%   Diagnostic of Diabetes Mellitus           (if abnormal result is confirmed) 5.7-6.4%   Increased risk of developing Diabetes Mellitus References:Diagnosis and Classification of Diabetes Mellitus,Diabetes Care,2011,34(Suppl 1):S62-S69 and Standards of Medical Care in         Diabetes -  2011,Diabetes Care,2011,34 (Suppl 1):S11-S61.    CBG: Recent Labs  Lab 12/25/23 1534 12/25/23 1909 12/25/23 2328 12/26/23 0345 12/26/23 0746  GLUCAP 98 125* 122* 136* 119*    Review of Systems:   Review of Systems  Constitutional:  Positive for chills, malaise/fatigue and weight loss. Negative for diaphoresis and fever.  HENT:  Positive for sore throat. Negative for congestion, nosebleeds and sinus pain.   Respiratory:  Positive for cough, sputum production and shortness of breath. Negative for hemoptysis, wheezing and stridor.   Cardiovascular:  Negative for chest pain, palpitations, orthopnea, claudication, leg swelling and PND.  Gastrointestinal:  Positive for heartburn. Negative for blood in stool, diarrhea and melena.  Genitourinary:  Negative for dysuria, frequency and urgency.  Musculoskeletal:  Negative for myalgias.  Skin:  Negative for itching and rash.   Past Medical History:  She,  has a past medical history of Anxiety, Benign liver cyst, Chronic intestinal pseudo-obstruction, GERD (gastroesophageal reflux disease), History of UTI, and Nausea and vomiting (04/28/2020).   Surgical History:   Past Surgical History:  Procedure Laterality Date   BIOPSY  05/20/2020   Procedure: BIOPSY;  Surgeon: Celedonio Coil, MD;  Location: [facility];  Service: Endoscopy;;   CESAREAN SECTION     twice 2010, 2013   CHOLECYSTECTOMY     ESOPHAGOGASTRODUODENOSCOPY (EGD) WITH PROPOFOL  N/A 05/20/2020   Procedure: ESOPHAGOGASTRODUODENOSCOPY (EGD) WITH PROPOFOL ;  Surgeon: Celedonio Coil, MD;  Location: [facility];  Service: Endoscopy;  Laterality: N/A;   Esure  ~2015   LAPAROTOMY N/A 10/29/2023   Procedure: LAPAROTOMY, EXPLORATORY; RIGHT EXTENDED COLECTOMY;  Surgeon: Adalberto Acton, MD;  Location: [facility];  Service: General;  Laterality: N/A;   LAPAROTOMY N/A 10/31/2023  Procedure: RE-EXPLORATION OF ABDOMEN, CHOLECYSTECTOMY, ILEOCOLONIC ANASTOMOSIS;  Surgeon: Aldean Hummingbird, MD;   Location: [facility];  Service: General;  Laterality: N/A;     Social History:   reports that she has never smoked. She has never used smokeless tobacco. She reports that she does not drink alcohol and does not use drugs.   Family History:  Her family history includes CAD in her father; Diabetes in her brother and father; Hypertension in her father. There is no history of Colon cancer, Breast cancer, or Adrenal disorder.   Allergies Allergies  Allergen Reactions   Tetracyclines & Related Swelling    Swelling in spine; required spinal tap.    Erythromycin Other (See Comments)    Swelling in spine; required spinal tap   Raspberry Rash   Sulfa Antibiotics Rash    hives   Sulfonamide Derivatives Rash    Reaction to cream     Home Medications  Prior to Admission medications   Medication Sig Start Date End Date Taking? Authorizing Provider  acetaminophen  (TYLENOL ) 500 MG tablet Take 2 tablets (1,000 mg total) by mouth every 6 (six) hours as needed for mild pain (pain score 1-3), fever or headache. 11/15/23  Yes Elwin Hammond, PA-C  cyanocobalamin  (,VITAMIN B-12,) 1000 MCG/ML injection Inject 1,000 mcg into the muscle every 30 (thirty) days.   Yes [provider]  megestrol  (MEGACE  ES) 625 MG/5ML suspension SHAKE LIQUID AND TAKE 5 ML(625 MG) BY MOUTH DAILY 10/24/23  Yes Tabori, Katherine E, MD  midodrine  (PROAMATINE ) 5 MG tablet Take 1 tablet (5 mg total) by mouth 3 (three) times daily with meals. 03/17/23  Yes Maudine Sos, MD  montelukast  (SINGULAIR ) 10 MG tablet Take 1 tablet (10 mg total) by mouth daily. 06/01/23  Yes Tabori, Katherine E, MD  Multiple Vitamin (MULTIVITAMIN WITH MINERALS) TABS tablet Take 1 tablet by mouth daily. Patient taking differently: Take 1 tablet by mouth every evening. 05/29/20  Yes Armenta Landau, MD  ondansetron  (ZOFRAN -ODT) 4 MG disintegrating tablet Take 1 tablet (4 mg total) by mouth every 8 (eight) hours as needed for nausea or vomiting.  12/08/23  Yes Jess Morita, MD  pantoprazole  (PROTONIX ) 40 MG tablet Take 1 tablet (40 mg total) by mouth daily. Patient taking differently: Take 40 mg by mouth 2 (two) times daily. 12/13/23  Yes Zackowski, Scott, MD  polyethylene glycol (MIRALAX  / GLYCOLAX ) 17 g packet Take 17 g by mouth 3 (three) times daily. Patient taking differently: Take 17 g by mouth 3 (three) times daily as needed for moderate constipation. 05/28/20  Yes Armenta Landau, MD  traZODone  (DESYREL ) 50 MG tablet Take 0.5-1 tablets (25-50 mg total) by mouth at bedtime as needed for sleep. Patient taking differently: Take 50 mg by mouth at bedtime as needed for sleep. 12/08/23  Yes Tabori, Katherine E, MD  Vitamin D , Ergocalciferol , (DRISDOL ) 1.25 MG (50000 UNIT) CAPS capsule Take 50,000 Units by mouth once a week. 11/02/23  Yes [provider]  famotidine  (PEPCID ) 40 MG/5ML suspension Take 5 mLs (40 mg total) by mouth daily. Patient not taking: Reported on 12/23/2023 12/12/23   Tabori, Katherine E, MD  fludrocortisone  (FLORINEF ) 0.1 MG tablet TAKE 1 TABLET(0.1 MG) BY MOUTH TWICE DAILY Patient not taking: Reported on 12/23/2023 07/25/23   Tabori, Katherine E, MD  potassium chloride  SA (KLOR-CON  M) 20 MEQ tablet Take 1 tablet (20 mEq total) by mouth daily. Patient not taking: Reported on 12/23/2023 12/13/23   Zackowski, Scott, MD  prochlorperazine (COMPAZINE) 25  MG suppository Place 25 mg rectally every 12 (twelve) hours as needed for nausea or vomiting. Patient not taking: Reported on 12/23/2023    [provider]     Critical care time: 39 min    Madelynn Schilder, MD [facility]

## 2023-12-26 NOTE — Evaluation (Signed)
 Physical Therapy Evaluation Patient Details Name: Stacy [name] MRN: [id number] DOB: [birth year] Today's Date: 12/26/2023  History of Present Illness  Pt is a 43 y/o F presenting to ED on 5/9 with severe heartburn, underwent EGD and developed n/v, hypoxia, hypotension, CT abd/pelvis with esophagitis. Pt with aspiration PNA leading to acute respiratory failure. 10/2023 wtih colon perforation, pericolic abscess, septic shock 2/2 peritonitis, s/p ex lap and R colectomy, developed postop ilius. PMH includes orthostatic hypotension, GERD/esophagitis  Clinical Impression  Pt admitted with above diagnosis and presents to PT with functional limitations due to deficits listed below (See PT problem list). Pt needs skilled PT to maximize independence and safety. Today pt limited by lightheadedness. BP 90's/60's sitting with feet up in recliner. 63/54 sitting after transfer chair to bed with pt c/o lightheadedness. Expect that pt's mobility will improve as medical status improves. Family supportive. Recommend HHPT at time of dc.           If plan is discharge home, recommend the following: A little help with walking and/or transfers;A little help with bathing/dressing/bathroom;Assistance with cooking/housework;Assist for transportation;Help with stairs or ramp for entrance   Can travel by private vehicle        Equipment Recommendations None recommended by PT  Recommendations for Other Services       Functional Status Assessment Patient has had a recent decline in their functional status and demonstrates the ability to make significant improvements in function in a reasonable and predictable amount of time.     Precautions / Restrictions Precautions Precautions: Fall Recall of Precautions/Restrictions: Intact Precaution/Restrictions Comments: NGT Restrictions Weight Bearing Restrictions Per Provider Order: No      Mobility  Bed Mobility Overal bed mobility: Needs Assistance Bed  Mobility: Sit to Supine       Sit to supine: Min assist   General bed mobility comments: Assist to bring legs back up into bed.    Transfers Overall transfer level: Needs assistance Equipment used: Rollator (4 wheels) Transfers: Sit to/from Stand, Bed to chair/wheelchair/BSC Sit to Stand: Min assist   Step pivot transfers: Min assist       General transfer comment: Assist for balance and support. Recliner to bed to bsc to bed. Pt became lightheaded with standing/transfers.    Ambulation/Gait               General Gait Details: Unable due to lightheadedness and low BP  Stairs            Wheelchair Mobility     Tilt Bed    Modified Rankin (Stroke Patients Only)       Balance Overall balance assessment: Needs assistance Sitting-balance support: Feet supported Sitting balance-Leahy Scale: Good     Standing balance support: During functional activity, Bilateral upper extremity supported Standing balance-Leahy Scale: Poor Standing balance comment: UE support                             Pertinent Vitals/Pain      Home Living Family/patient expects to be discharged to:: Private residence Living Arrangements: Spouse/significant other;Children Available Help at Discharge: Family;Available 24 hours/day Type of Home: House Home Access: Stairs to enter   [employer] of Steps: 2   Home Layout: Two level;Able to live on main level with bedroom/bathroom Home Equipment: Rollator (4 wheels);Shower seat      Prior Function Prior Level of Function : Independent/Modified Independent  Mobility Comments: has been using rollator since march ADLs Comments: spouse assisting     Extremity/Trunk Assessment   Upper Extremity Assessment Upper Extremity Assessment: Defer to OT evaluation    Lower Extremity Assessment Lower Extremity Assessment: Generalized weakness    Cervical / Trunk Assessment Cervical / Trunk  Assessment: Normal  Communication   Communication Communication: No apparent difficulties    Cognition Arousal: Alert Behavior During Therapy: WFL for tasks assessed/performed, Flat affect   PT - Cognitive impairments: No apparent impairments                         Following commands: Intact       Cueing Cueing Techniques: Verbal cues     General Comments General comments (skin integrity, edema, etc.): SpO2 90's on 2L O2. BP 90's/60's sitting with feet up in recliner. 63/54 sitting after transfer chair to bed.    Exercises     Assessment/Plan    PT Assessment Patient needs continued PT services  PT Problem List Decreased strength;Decreased activity tolerance;Decreased balance;Decreased mobility;Cardiopulmonary status limiting activity       PT Treatment Interventions DME instruction;Gait training;Functional mobility training;Therapeutic activities;Therapeutic exercise;Balance training;Stair training;Patient/family education    PT Goals (Current goals can be found in the Care Plan section)  Acute Rehab PT Goals Patient Stated Goal: return home PT Goal Formulation: With patient/family Time For Goal Achievement: 01/09/24 Potential to Achieve Goals: Good    Frequency Min 2X/week     Co-evaluation               AM-PAC PT "6 Clicks" Mobility  Outcome Measure Help needed turning from your back to your side while in a flat bed without using bedrails?: A Little Help needed moving from lying on your back to sitting on the side of a flat bed without using bedrails?: A Little Help needed moving to and from a bed to a chair (including a wheelchair)?: A Little Help needed standing up from a chair using your arms (e.g., wheelchair or bedside chair)?: A Little Help needed to walk in hospital room?: Total Help needed climbing 3-5 steps with a railing? : Total 6 Click Score: 14    End of Session Equipment Utilized During Treatment: Oxygen Activity Tolerance:  Treatment limited secondary to medical complications (Comment) (low BP) Patient left: in bed;with call bell/phone within reach;with family/visitor present Nurse Communication: Mobility status;Other (comment) (BP's) PT Visit Diagnosis: Other abnormalities of gait and mobility (R26.89);Muscle weakness (generalized) (M62.81);Unsteadiness on feet (R26.81);Difficulty in walking, not elsewhere classified (R26.2)    Time: 1239-1300 PT Time Calculation (min) (ACUTE ONLY): 21 min   Charges:   PT Evaluation $PT Eval Moderate Complexity: 1 Mod   PT General Charges $$ ACUTE PT VISIT: 1 Visit         [facility] PT Acute Rehabilitation Services Office [phone]   Pura Browns [facility] 12/26/2023, 5:19 PM

## 2023-12-26 NOTE — Progress Notes (Signed)
 PHARMACY - TOTAL PARENTERAL NUTRITION CONSULT NOTE   Indication: Prolonged ileus  Patient Measurements: Height: 5\' 3"  (160 cm) Weight: 48.9 kg (107 lb 12.9 oz) IBW/kg (Calculated) : 52.4 TPN AdjBW (KG): 43.8 Body mass index is 19.1 kg/m. Usual Weight: ~110 lbs  Assessment:  43 yo W with nausea and vomiting for 2 weeks with at least 12 lbs weight loss due to poor PO intake. In March 2025, patient had colon perforation, pericolic abscess, diverticulitis, peritonitis, and septic shock s/p ex lap and right colectomy followed by ileocolic anastomosis and cholecystectomy.  Patient required TPN 3/18- 3/31 due to post-op ileus. CT on 4/29 was suspicious for ileus. Patient had an outpatient EGD 12/23/23 complicated by aspiration and hypotension. Pharmacy consulted for TPN.   Glucose / Insulin : CBG 98-145, 9 units SSI/ past 24 hours  Electrolytes: Na 141, K 3.0 (60 mEq outside of TPN given this AM), Cl 110, CO2 24, CoCa 9.6, Phos 3.4, Mg 1.6 (4 g outside of TPN given this AM)  Renal: Scr at baseline (0.4 - 0.6), BUN 12 (from 47) Hepatic: albumin  <1.5, AST/ALT 11/16, Tbili 0.8 Intake / Output; MIVF:  1.2 L UOP + 4 unmeasured, 450 mL NGT output  Net IO Since Admission: 3,602 mL [12/26/23 0733] Last bowel movement 5/11 x3  Reglan  5/9 >> Enema 5/11  GI imaging: 4/29 CT: Mild-to-moderate diffuse small bowel dilatation, without definite transition point or significant interval change, suspicious for ileus. 5/9 KUB: dilated small bowel  5/11 CT: Persistent esophagitis and enterocolitis as well as ileus.  GI procedures: none this admit  Central access: CVC 5/9  TPN start date: 5/10   Nutritional Goals: Goal TPN rate is 60 mL/hr (55g/L AA, 15% dextrose , 32 g/L lipids) provides 79 g of protein and 1511 kcals per day  RD Assessment: Estimated Needs Total Energy Estimated Needs: 1500-1600 kcals Total Protein Estimated Needs: 75-85 g Total Fluid Estimated Needs: 1.5 L  Current Nutrition:  NPO,  NGT LIWS, + TPN  Plan:  Continue TPN at 60 mL/hr at 1800, goal rate providing 100% of needs  Electrolytes in TPN: Na 50 mEq/L, K 50 mEq/L, Ca 5 mEq/L, Mg 10 mEq/L, and Phos 15 mmol/L. 1:1 acetate to chloride ratio  Monitor electrolytes Add standard MVI and trace elements to TPN No chromium due to shortage Continue Sensitive q4h SSI and adjust as needed  Monitor TPN labs on Mon/Thurs, daily x 3 days upon initiation and PRN - next labs 5/12 AM  Stacy [name], PharmD, BCPS, BCCCP Clinical Pharmacist

## 2023-12-26 NOTE — Progress Notes (Signed)
 [facility] Progress Note  Stacy [name] 43 y.o. [birth year]  CC: Nausea, vomiting, poor oral intake, pneumonia   Subjective: Patient seen and examined at bedside.  Husband and RN at bedside.  Had a small bowel movement this morning.  Feeling somewhat better.  ROS : Negative for bleeding.   Objective: Vital signs in last 24 hours: Vitals:   12/26/23 0800 12/26/23 0900  BP: 117/84 113/80  Pulse: (!) 105 (!) 111  Resp: 18 (!) 21  Temp:    SpO2: 97% 92%    Physical Exam:  Resting in the bed, not in acute distress.  NG tube in place.  Multiple lines in place.  Abdomen is distended with hypoactive bowel sounds.  No peritoneal signs.  Mood and affect normal but somewhat concerned.  Alert and oriented x 3.  Lab Results: Recent Labs    12/25/23 0444 12/26/23 0437  NA 141 141  K 3.2* 3.0*  CL 113* 110  CO2 21* 24  GLUCOSE 146* 145*  BUN 23* 12  CREATININE 0.43* 0.39*  CALCIUM  8.1* 7.6*  MG 1.9 1.6*  PHOS 2.5 3.4   Recent Labs    12/23/23 1447 12/25/23 0444 12/26/23 0437  AST 25  --  11*  ALT 29  --  16  ALKPHOS 73  --  58  BILITOT 1.6*  --  0.8  PROT 6.1*  --  4.3*  ALBUMIN  2.3* 1.6* <1.5*   Recent Labs    12/23/23 1447 12/23/23 1449 12/24/23 0226 12/25/23 0444  WBC 5.8  --  26.2* 16.9*  NEUTROABS 4.2  --   --   --   HGB 9.9*   < > 8.4* 7.1*  HCT 33.0*   < > 26.4* 22.2*  MCV 92.2  --  86.6 85.7  PLT 731*  --  651* 471*   < > = values in this interval not displayed.   Recent Labs    12/23/23 1447  LABPROT 18.5*  INR 1.5*      Assessment/Plan: -Aspiration pneumonia with acute hypoxic respiratory failure. 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  Now on TPN. -Anemia.  Hemoglobin 7.1 today.  No overt  bleeding.   Recommendations --------------------------- -Patient had small bowel movement this morning but her abdomen remains distended. - Will give 1 fleets enema today. -Okay to try clear liquid diet after clamping NG tube.  If tolerating clear liquid diet, then diet can be advanced to full liquid.  Hold off on advancing diet to soft or solid at this time. 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   -Continue other supportive care with antibiotics and Reglan  and MiraLAX .  -GI will follow     Felecia Hopper MD, FACP 12/26/2023, 10:24 AM  Contact #  [phone]

## 2023-12-26 NOTE — Progress Notes (Signed)
 eLink Physician-Brief Progress Note Patient Name: Stacy [name] DOB: 1980/10/07 MRN: [id number]   Date of Service  12/26/2023  HPI/Events of Note  BMP, mag and phos notified  eICU Interventions  K and mag replaced Recheck K at 2 pm      Intervention Category Major Interventions: Electrolyte abnormality - evaluation and management  Darryl Blumenstein G Pola Furno 12/26/2023, 6:12 AM

## 2023-12-26 NOTE — Evaluation (Signed)
 Occupational Therapy Evaluation Patient Details Name: Stacy [name] MRN: [id number] DOB: [birth year] Today's Date: 12/26/2023   History of Present Illness   Pt is a 43 y/o F presenting to ED on 5/9 with severe heartburn, underwent EGD and developed n/v, hypoxia, hypotension, CT abd/pelvis with esophagitis. 10/2023 wtih colon perforation, pericolic abscess, septic shock 2/2 peritonitis, s/p ex lap and R colectomy, developed postop ilius. PMH includes orthostatic hypotension, GERD/esophagitis     Clinical Impressions Pt reports since March of this year, she has had assist for ADLs and has been using a rollator for mobility, prior to that she was independent. Pt lives with spouse/family who can assist. Pt currently needing up to max A for ADLs, min A for bed mobility, and min A for pivot transfer to [facility] and then to chair with 1 person HHA. Pt Spo2 down to high 80s on 2L O2, incr to 3L and pt satting low 90s. Pt presenting with impairments listed below, will follow acutely. Recommend HHOT at d/c.     If plan is discharge home, recommend the following:   A little help with walking and/or transfers;A lot of help with bathing/dressing/bathroom;Assistance with cooking/housework;Direct supervision/assist for financial management;Direct supervision/assist for medications management;Assist for transportation;Help with stairs or ramp for entrance     Functional Status Assessment   Patient has had a recent decline in their functional status and demonstrates the ability to make significant improvements in function in a reasonable and predictable amount of time.     Equipment Recommendations   BSC/3in1     Recommendations for Other Services   PT consult     Precautions/Restrictions   Precautions Precautions: Fall Precaution/Restrictions Comments: NGT Restrictions Weight Bearing Restrictions Per Provider Order: No     Mobility Bed Mobility Overal bed mobility: Needs  Assistance Bed Mobility: Supine to Sit     Supine to sit: Min assist     General bed mobility comments: min A for trunk elevation    Transfers Overall transfer level: Needs assistance Equipment used: 1 person hand held assist Transfers: Sit to/from Stand, Bed to chair/wheelchair/BSC       Step pivot transfers: Min assist     General transfer comment: x2 to [facility] and to chair      Balance Overall balance assessment: Needs assistance Sitting-balance support: Feet supported Sitting balance-Leahy Scale: Good     Standing balance support: During functional activity Standing balance-Leahy Scale: Fair                             ADL either performed or assessed with clinical judgement   ADL Overall ADL's : Needs assistance/impaired Eating/Feeding: NPO   Grooming: Set up;Sitting   Upper Body Bathing: Minimal assistance;Sitting   Lower Body Bathing: Moderate assistance;Sitting/lateral leans   Upper Body Dressing : Minimal assistance;Sitting   Lower Body Dressing: Moderate assistance;Sitting/lateral leans   Toilet Transfer: Minimal assistance;Stand-pivot   Toileting- Clothing Manipulation and Hygiene: Maximal assistance       Functional mobility during ADLs: Minimal assistance       Vision   Vision Assessment?: No apparent visual deficits     Perception Perception: Not tested       Praxis Praxis: Not tested       Pertinent Vitals/Pain Pain Assessment Pain Assessment: No/denies pain     Extremity/Trunk Assessment Upper Extremity Assessment Upper Extremity Assessment: Generalized weakness   Lower Extremity Assessment Lower Extremity Assessment: Defer to PT evaluation  Cervical / Trunk Assessment Cervical / Trunk Assessment: Normal   Communication Communication Communication: No apparent difficulties   Cognition Arousal: Alert Behavior During Therapy: WFL for tasks assessed/performed, Flat affect Cognition: No apparent  impairments                               Following commands: Intact       Cueing  General Comments   Cueing Techniques: Verbal cues  SpO2 in high 80s on 2L O2, incr to 3L O2 wtih pt satting low 90s   Exercises     Shoulder Instructions      Home Living Family/patient expects to be discharged to:: Private residence Living Arrangements: Spouse/significant other;Children Available Help at Discharge: Family;Available 24 hours/day Type of Home: House Home Access: Stairs to enter [employer] of Steps: 2   Home Layout: Two level;Able to live on main level with bedroom/bathroom     Bathroom Shower/Tub: Walk-in shower         Home Equipment: Rollator (4 wheels);Shower seat          Prior Functioning/Environment Prior Level of Function : Independent/Modified Independent             Mobility Comments: has been using rollator since march ADLs Comments: spouse assisting    OT Problem List: Decreased strength;Decreased range of motion;Decreased activity tolerance;Impaired balance (sitting and/or standing);Decreased coordination;Decreased safety awareness;Cardiopulmonary status limiting activity   OT Treatment/Interventions: Self-care/ADL training;Therapeutic exercise;Energy conservation;DME and/or AE instruction;Therapeutic activities;Patient/family education;Balance training      OT Goals(Current goals can be found in the care plan section)   Acute Rehab OT Goals Patient Stated Goal: none stated OT Goal Formulation: With patient Time For Goal Achievement: 01/09/24 Potential to Achieve Goals: Good ADL Goals Pt Will Perform Grooming: with supervision;sitting Pt Will Perform Upper Body Dressing: with supervision;sitting Pt Will Perform Lower Body Dressing: with supervision;sit to/from stand;sitting/lateral leans Pt Will Transfer to Toilet: with supervision;ambulating;regular height toilet Pt Will Perform Tub/Shower Transfer: Tub  transfer;Shower transfer;with supervision;ambulating   OT Frequency:  Min 2X/week    Co-evaluation              AM-PAC OT "6 Clicks" Daily Activity     Outcome Measure Help from another person eating meals?: A Little Help from another person taking care of personal grooming?: A Little Help from another person toileting, which includes using toliet, bedpan, or urinal?: A Lot Help from another person bathing (including washing, rinsing, drying)?: A Lot Help from another person to put on and taking off regular upper body clothing?: A Little Help from another person to put on and taking off regular lower body clothing?: A Lot 6 Click Score: 15   End of Session Equipment Utilized During Treatment: Oxygen Nurse Communication: Mobility status  Activity Tolerance: Patient tolerated treatment well Patient left: in chair;with call bell/phone within reach  OT Visit Diagnosis: Unsteadiness on feet (R26.81);Other abnormalities of gait and mobility (R26.89);Muscle weakness (generalized) (M62.81)                Time: 1308-6578 OT Time Calculation (min): 27 min Charges:  OT General Charges $OT Visit: 1 Visit OT Evaluation $OT Eval Moderate Complexity: 1 Mod OT Treatments $Self Care/Home Management : 8-22 mins  Ysabel Cowgill K, OTD, OTR/L SecureChat Preferred Acute Rehab [phone]   Antionette Kirks 12/26/2023, 12:23 PM

## 2023-12-27 ENCOUNTER — Inpatient Hospital Stay (HOSPITAL_COMMUNITY)

## 2023-12-27 DIAGNOSIS — K5904 Chronic idiopathic constipation: Secondary | ICD-10-CM | POA: Diagnosis not present

## 2023-12-27 DIAGNOSIS — A419 Sepsis, unspecified organism: Secondary | ICD-10-CM | POA: Diagnosis not present

## 2023-12-27 DIAGNOSIS — J9601 Acute respiratory failure with hypoxia: Secondary | ICD-10-CM

## 2023-12-27 DIAGNOSIS — D509 Iron deficiency anemia, unspecified: Secondary | ICD-10-CM

## 2023-12-27 DIAGNOSIS — J690 Pneumonitis due to inhalation of food and vomit: Secondary | ICD-10-CM

## 2023-12-27 DIAGNOSIS — R6521 Severe sepsis with septic shock: Secondary | ICD-10-CM | POA: Diagnosis not present

## 2023-12-27 DIAGNOSIS — E43 Unspecified severe protein-calorie malnutrition: Secondary | ICD-10-CM

## 2023-12-27 LAB — CBC WITH DIFFERENTIAL/PLATELET
Abs Immature Granulocytes: 0.1 10*3/uL — ABNORMAL HIGH (ref 0.00–0.07)
Basophils Absolute: 0 10*3/uL (ref 0.0–0.1)
Basophils Relative: 0 %
Eosinophils Absolute: 0.1 10*3/uL (ref 0.0–0.5)
Eosinophils Relative: 1 %
HCT: 16.7 % — ABNORMAL LOW (ref 36.0–46.0)
Hemoglobin: 5.2 g/dL — CL (ref 12.0–15.0)
Immature Granulocytes: 2 %
Lymphocytes Relative: 19 %
Lymphs Abs: 1.2 10*3/uL (ref 0.7–4.0)
MCH: 26.9 pg (ref 26.0–34.0)
MCHC: 31.1 g/dL (ref 30.0–36.0)
MCV: 86.5 fL (ref 80.0–100.0)
Monocytes Absolute: 0.7 10*3/uL (ref 0.1–1.0)
Monocytes Relative: 11 %
Neutro Abs: 4.2 10*3/uL (ref 1.7–7.7)
Neutrophils Relative %: 67 %
Platelets: 357 10*3/uL (ref 150–400)
RBC: 1.93 MIL/uL — ABNORMAL LOW (ref 3.87–5.11)
RDW: 15.2 % (ref 11.5–15.5)
WBC: 6.2 10*3/uL (ref 4.0–10.5)
nRBC: 0 % (ref 0.0–0.2)

## 2023-12-27 LAB — RENAL FUNCTION PANEL
Albumin: <1.5 g/dL — ABNORMAL LOW (ref 3.5–5.0)
Anion gap: 6 (ref 5–15)
BUN: 10 mg/dL (ref 6–20)
CO2: 30 mmol/L (ref 22–32)
Calcium: 7.7 mg/dL — ABNORMAL LOW (ref 8.9–10.3)
Chloride: 101 mmol/L (ref 98–111)
Creatinine, Ser: 0.35 mg/dL — ABNORMAL LOW (ref 0.44–1.00)
GFR, Estimated: >60 mL/min (ref >60)
Glucose, Bld: 117 mg/dL — ABNORMAL HIGH (ref 70–99)
Phosphorus: 3.3 mg/dL (ref 2.5–4.6)
Potassium: 3.4 mmol/L — ABNORMAL LOW (ref 3.5–5.1)
Sodium: 137 mmol/L (ref 135–145)

## 2023-12-27 LAB — BASIC METABOLIC PANEL WITH GFR
Anion gap: 6 (ref 5–15)
BUN: 10 mg/dL (ref 6–20)
CO2: 30 mmol/L (ref 22–32)
Calcium: 7.7 mg/dL — ABNORMAL LOW (ref 8.9–10.3)
Chloride: 100 mmol/L (ref 98–111)
Creatinine, Ser: <0.3 mg/dL — ABNORMAL LOW (ref 0.44–1.00)
Glucose, Bld: 117 mg/dL — ABNORMAL HIGH (ref 70–99)
Potassium: 3.3 mmol/L — ABNORMAL LOW (ref 3.5–5.1)
Sodium: 136 mmol/L (ref 135–145)

## 2023-12-27 LAB — CBC
HCT: 17.7 % — ABNORMAL LOW (ref 36.0–46.0)
Hemoglobin: 5.4 g/dL — CL (ref 12.0–15.0)
MCH: 26.6 pg (ref 26.0–34.0)
MCHC: 30.5 g/dL (ref 30.0–36.0)
MCV: 87.2 fL (ref 80.0–100.0)
Platelets: 373 10*3/uL (ref 150–400)
RBC: 2.03 MIL/uL — ABNORMAL LOW (ref 3.87–5.11)
RDW: 15.1 % (ref 11.5–15.5)
WBC: 6.5 10*3/uL (ref 4.0–10.5)
nRBC: 0 % (ref 0.0–0.2)

## 2023-12-27 LAB — PREPARE RBC (CROSSMATCH)

## 2023-12-27 LAB — MAGNESIUM: Magnesium: 1.7 mg/dL (ref 1.7–2.4)

## 2023-12-27 MED ORDER — BOOST / RESOURCE BREEZE PO LIQD CUSTOM
1.0000 | Freq: Three times a day (TID) | ORAL | Status: DC
  Administered 2023-12-27 – 2023-12-29 (×5): 1 via ORAL

## 2023-12-27 MED ORDER — SIMETHICONE 80 MG PO CHEW
80.0000 mg | CHEWABLE_TABLET | Freq: Four times a day (QID) | ORAL | Status: DC
  Administered 2023-12-27 – 2024-01-03 (×27): 80 mg via ORAL
  Filled 2023-12-27 (×27): qty 1

## 2023-12-27 MED ORDER — FAMOTIDINE IN NACL 20-0.9 MG/50ML-% IV SOLN
20.0000 mg | Freq: Once | INTRAVENOUS | Status: AC
  Administered 2023-12-27: 20 mg via INTRAVENOUS
  Filled 2023-12-27: qty 50

## 2023-12-27 MED ORDER — ADULT MULTIVITAMIN W/MINERALS CH
1.0000 | ORAL_TABLET | Freq: Every day | ORAL | Status: DC
  Administered 2023-12-27: 1 via ORAL
  Filled 2023-12-27: qty 1

## 2023-12-27 MED ORDER — MAGNESIUM SULFATE 4 GM/100ML IV SOLN
4.0000 g | Freq: Once | INTRAVENOUS | Status: AC
  Administered 2023-12-27: 4 g via INTRAVENOUS
  Filled 2023-12-27: qty 100

## 2023-12-27 MED ORDER — ALUM & MAG HYDROXIDE-SIMETH 200-200-20 MG/5ML PO SUSP
30.0000 mL | ORAL | Status: DC | PRN
  Administered 2023-12-27 (×3): 30 mL via ORAL
  Filled 2023-12-27 (×3): qty 30

## 2023-12-27 MED ORDER — PROSOURCE PLUS PO LIQD
30.0000 mL | Freq: Two times a day (BID) | ORAL | Status: DC
  Administered 2023-12-27 – 2023-12-29 (×5): 30 mL via ORAL
  Filled 2023-12-27 (×10): qty 30

## 2023-12-27 MED ORDER — TRAVASOL 10 % IV SOLN
INTRAVENOUS | Status: AC
  Filled 2023-12-27: qty 792

## 2023-12-27 MED ORDER — POTASSIUM CHLORIDE 10 MEQ/100ML IV SOLN
10.0000 meq | INTRAVENOUS | Status: AC
  Administered 2023-12-27 (×4): 10 meq via INTRAVENOUS
  Filled 2023-12-27 (×4): qty 100

## 2023-12-27 MED ORDER — SODIUM CHLORIDE 0.9% IV SOLUTION: Freq: Once | INTRAVENOUS | Status: AC

## 2023-12-27 NOTE — Assessment & Plan Note (Signed)
 K+ was 3.1 in ER.  She is having ongoing vomiting which puts her at risk for worsening hypokalemia. Check labs.  Replete prn.

## 2023-12-27 NOTE — Progress Notes (Signed)
 SATURATION QUALIFICATIONS: (This note is used to comply with regulatory documentation for home oxygen)  Patient Saturations on Room Air at Rest = 86%  Patient Saturations on Room Air while Ambulating = N/A  Patient Saturations on 1.5 Liters of oxygen while resting = 88%  Patient Saturations on 3 Liters of oxygen while resting = 92% and above.  Please briefly explain why patient needs home oxygen:  Pt hypoxic on RA after step pivot from bed to chair, needs increase to 3L O2 Ruth to maintain SpO2 92% and above.

## 2023-12-27 NOTE — Plan of Care (Signed)

## 2023-12-27 NOTE — Progress Notes (Signed)
 [facility] Progress Note  Stacy [name] 43 y.o. 12-19-1980  CC: Nausea, vomiting, poor oral intake, pneumonia   Subjective: Patient seen and examined at bedside.  Family at bedside.  NG tube has been removed.  She is transferred out of ICU.  Last bowel movement yesterday after enema.   ROS : Negative for bleeding.   Objective: Vital signs in last 24 hours: Vitals:   12/27/23 0421 12/27/23 0755  BP: 106/70 102/65  Pulse: (!) 110 (!) 117  Resp: 18 17  Temp: 99.7 F (37.6 C) 98.8 F (37.1 C)  SpO2: 95% 96%    Physical Exam:  Resting in the bed, not in acute distress.  Abdomen remains distended with hypoactive bowel sounds.  No peritoneal signs.  Mood and affect normal but somewhat concerned.  Alert and oriented x 3.  Lab Results: Recent Labs    12/26/23 0437 12/26/23 1415 12/27/23 1057  NA 141  --  136  137  K 3.0* 3.5 3.3*  3.4*  CL 110  --  100  101  CO2 24  --  30  30  GLUCOSE 145*  --  117*  117*  BUN 12  --  10  10  CREATININE 0.39*  --  <0.30*  0.35*  CALCIUM  7.6*  --  7.7*  7.7*  MG 1.6*  --  1.7  PHOS 3.4  --  3.3   Recent Labs    12/26/23 0437 12/27/23 1057  AST 11*  --   ALT 16  --   ALKPHOS 58  --   BILITOT 0.8  --   PROT 4.3*  --   ALBUMIN  <1.5* <1.5*   Recent Labs    12/25/23 0444  WBC 16.9*  HGB 7.1*  HCT 22.2*  MCV 85.7  PLT 471*   No results for input(s): "LABPROT", "INR" in the last 72 hours.     Assessment/Plan: -Aspiration pneumonia with acute hypoxic respiratory failure. 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  Now on TPN. -Anemia.  Hemoglobin 7.1  No overt bleeding.   Recommendations --------------------------- - 1 large bowel movement yesterday after fleets enema.  No  bowel movement today.  Her abdomen remains distended.  NG tube has been removed.  Recommend to continue liquid diet for now.  Hold off on advancing diet to soft or solid at this time. 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   -Continue other supportive care with antibiotics and Reglan  and MiraLAX .  -GI will follow     Felecia Hopper MD, FACP 12/27/2023, 12:11 PM  Contact #  [phone]

## 2023-12-27 NOTE — Assessment & Plan Note (Signed)
 Deteriorated.  She is not able to eat or drink in the last 24 hrs.  Is down 12 lbs in 2 weeks.  Nutrition status is a serious concern.  Encouraged her to discuss parenteral nutrition w/ GI.  Will follow closely.

## 2023-12-27 NOTE — Progress Notes (Signed)
 Mobility Specialist: Progress Note   12/27/23 1212  Mobility  Activity Ambulated with assistance in hallway  Level of Assistance Contact guard assist, steadying assist  Assistive Device Front wheel walker  Distance Ambulated (ft) 250 ft  Activity Response Tolerated well  Mobility Referral Yes  Mobility visit 1 Mobility  Mobility Specialist Start Time (ACUTE ONLY) [id number]  Mobility Specialist Stop Time (ACUTE ONLY) 1015  Mobility Specialist Time Calculation (min) (ACUTE ONLY) 22 min    Pt was agreeable to mobility session - received in bed. Husband and mother present. MinA for bed mobility to assist with scooting EOB. CG for STS and ambulation. SpO2 WFL on 2LO2. Took 3x setaed break d/t fatigue, denies SOB or dizziness. Returned to room without fault. MinA for sit>supine mobility to assist with bringing BLEs onto bed. Left in bed with all needs met, call bell in reach.   Deloria Fetch Mobility Specialist Please contact via SecureChat or Rehab office at [phone]

## 2023-12-27 NOTE — Assessment & Plan Note (Signed)
 Deteriorated.  Pt has vomited x5 in the last 24 hrs.  Is very weak, falling.  She has EGD scheduled for tomorrow as GI is concerned for possible esophagitis/gastritis/fungal infxn.  Continue Zofran  prn.  Likely needs to address nutrition concerns w/ GI. Encouraged her to ask about getting PICC line under anesthesia tomorrow.

## 2023-12-27 NOTE — Progress Notes (Signed)
 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 She was sent to ED for eval. Currently, she is on NRM 15 L, SpO2 94%, having chills, SOB, tachycardic, and tachypneic. She has N/V for 2 weeks and underwent eval in ED one 10 days ago with CT abd/pelvis:  Increased diffuse wall thickening of visualized distal thoracic esophagus, consistent with esophagitis. Mild-to-moderate diffuse small bowel dilatation, without definite transition point or significant interval change, suspicious for ileus. Mild improvement in diffuse small bowel wall thickening, suggesting improving enteritis. No evidence of abscess.   Her last BM was 4 days ago, soft. Denied f/c/r before EGD. Denied abd pain or worsening distension. Compliant with meds. No alcohol drinking, smoking, or illicit drug use. She gained after discharge but in the last 2 weeks, she has poor intake.    In October 29, 2023, she has colon perforation, pericolic abscess, diverticulitis, septic shock due to peritonitis, s/p ex lap and right colectomy, followed by ex lap with primary ileocolonic anastomosis and cholecystectomy. She developed post-operative Ileus, and she was placed on TPN for almost 10 days  before resuming oral intake.   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 5/10: On Levophed  3 mcg/min, RL 125 cc/hr, positive 1.8 fluid balance, feels better 5/11: N/V yesterday evening after eating and NGT had to be re inserted, NGT output in 50 cc only. Good UOP. Positive 1.3 L fluid balance in the last 24 hrs. Off Levophed . On TPN. NPO. On 12 L [place]. No BM or passing gas  5/12: has BM today and she wants to eat. NGT 440 cc in the last 24 hrs. On [place] 5 L. On TPN 5/13: She has abd distension and burping leading to vomiting four times and hospitalist order NGT. On TPN. 3 L [place]  Interim History / Subjective:    Objective    Blood pressure (!) 94/56, pulse (!) 115, temperature 100 F (37.8 C), temperature source Oral, resp. rate 20, height 5\' 3"  (1.6 m), weight 49.2 kg, last menstrual period 10/29/2023, SpO2 90%.        Intake/Output Summary (Last 24 hours) at 12/27/2023 1546 Last data filed at 12/27/2023 1146 Gross per 24 hour  Intake 750.5 ml  Output 600 ml  Net 150.5 ml   Filed Weights   12/25/23 0305 12/26/23 0422 12/27/23 0420  Weight: 42.7 kg 48.9 kg 49.2 kg    Examination: General: alert,  oriented x4, and mild resp distress. [place] 3 L, SpO2 96%  HENT: PERL, normal pharynx and oral mucosa. No LNE or thyromegaly. No JVD Lungs: symmetrical air entry bilaterally. Left crackles. No wheezing Cardiovascular: NL S1/S2. No m/g/r Abdomen: more distension, gaseous. No tenderness. +BS  Extremities: no edema. Symmetrical  Neuro: nonfocal   PICC line    Resolved Hospital Problem list     Assessment & Plan:  Aspiration PNA leading to acute hypoxic resp failure Septic shock vs chronic orthostatic hypotension (on Florinef  & Midodrine ) -Unasyn, day#5 -MRSA screening: negative -Sputum Cx -Bcx2: no growth  -PCT: high -Duoneb  -I/S -CVC/a-line (right femoral area): d/c 12/25/2023, PICC was inserted  -12/24/2023, Echo: EF 60%    HypoK/hypoMg: -Replace    AKI: resolved  -On TPN -Serial labs -I/O chart   GERD/esophagitis (on PPI/H2B) -Continue PPI bid -Reglan , allergic to erythromycin  -Miralax   -s/p Fleet Enema x1 per GI (12/25/2023) -Gas-X   Fe def anemia Moderate PCM Vit B12 and folate: NL -IV Fe, s/p two doses  -Glycemic control   Chronic idiopathic constipation/chronic intestinal pseudoobstruction  -Consult GI surgery: called but they had nothing extra to offer for now -GI on board: appreciated and noted their recommendations   Best Practice (right click and "Reselect all SmartList Selections" daily)   Diet/type: TPN DVT prophylaxis prophylactic heparin  Pressure ulcer(s): N/A GI prophylaxis: PPI Lines: PICC line  Foley:  N/A Code Status:  full code Last date of multidisciplinary goals of care discussion []   Labs   CBC: Recent Labs  Lab 12/22/23 1048 12/23/23 1441 12/23/23 1447 12/23/23 1449 12/24/23 0226 12/25/23 0444 12/27/23 1058 12/27/23 1318  WBC 12.6*  --  5.8  --  26.2* 16.9* 6.2 6.5  NEUTROABS 8.3*  --  4.2  --   --   --  4.2  --   HGB 8.7*   < > 9.9* 9.5* 8.4* 7.1* 5.2* 5.4*  HCT 27.3*   < > 33.0* 28.0* 26.4* 22.2* 16.7* 17.7*  MCV 86.4  --  92.2  --  86.6 85.7 86.5 87.2  PLT 745.0*  --  731*  --  651* 471* 357 373   < > = values in this interval not displayed.    Basic Metabolic Panel: Recent Labs  Lab 12/23/23 1447 12/23/23 1449 12/24/23 0226 12/25/23 0444 12/26/23 0437 12/26/23 1415 12/27/23 1057  NA 135 135 137 141 141  --  136  137  K 5.1 4.4 3.3* 3.2* 3.0* 3.5 3.3*   3.4*  CL 109  --  110 113* 110  --  100  101  CO2 14*  --  15* 21* 24  --  30  30  GLUCOSE 107*  --  129* 146* 145*  --  117*  117*  BUN 50*  --  47* 23* 12  --  10  10  CREATININE 1.17*  --  1.03* 0.43* 0.39*  --  <0.30*  0.35*  CALCIUM  8.3*  --  7.8* 8.1* 7.6*  --  7.7*  7.7*  MG 2.5*  --  2.1 1.9 1.6*  --  1.7  PHOS 6.4*  --  5.1* 2.5 3.4  --  3.3   GFR: Estimated Creatinine Clearance: 70.4 mL/min (A) (by C-G formula based on SCr of 0.35 mg/dL (L)). Recent Labs  Lab 12/23/23 1442 12/23/23 1447 12/24/23 0226 12/25/23 0444 12/27/23 1058 12/27/23 1318  PROCALCITON  --  0.50  --   --   --   --  WBC  --  5.8 26.2* 16.9* 6.2 6.5  LATICACIDVEN 1.5  --   --   --   --   --     Liver Function Tests: Recent Labs  Lab 12/22/23 1048 12/23/23 1447 12/25/23 0444 12/26/23 0437 12/27/23 1057  AST 14 25  --  11*  --   ALT 33 29  --  16  --   ALKPHOS 70 73  --  58  --   BILITOT 0.5 1.6*  --  0.8  --   PROT 6.3 6.1*  --  4.3*  --   ALBUMIN  2.9* 2.3* 1.6* <1.5* <1.5*   No results for input(s): "LIPASE", "AMYLASE" in the last 168 hours. No results for input(s): "AMMONIA" in the last 168 hours.  ABG    Component Value Date/Time   PHART 7.35 10/29/2023 1828   PCO2ART 45 10/29/2023 1828   PO2ART 275 (H) 10/29/2023 1828   HCO3 15.1 (L) 12/23/2023 1449   TCO2 16 (L) 12/23/2023 1449   ACIDBASEDEF 9.0 (H) 12/23/2023 1449   O2SAT 58 12/23/2023 1449     Coagulation Profile: Recent Labs  Lab 12/23/23 1447  INR 1.5*    Cardiac Enzymes: No results for input(s): "CKTOTAL", "CKMB", "CKMBINDEX", "TROPONINI" in the last 168 hours.  HbA1C: Hgb A1c MFr Bld  Date/Time Value Ref Range Status  07/28/2013 04:30 AM 5.1 <5.7 % Final    Comment:    (NOTE)                                                                       According to the ADA Clinical Practice Recommendations for 2011, when HbA1c is used as a screening test:  >=6.5%   Diagnostic of Diabetes Mellitus            (if abnormal result is confirmed) 5.7-6.4%   Increased risk of developing Diabetes Mellitus References:Diagnosis and Classification of Diabetes Mellitus,Diabetes Care,2011,34(Suppl 1):S62-S69 and Standards of Medical Care in         Diabetes - 2011,Diabetes Care,2011,34 (Suppl 1):S11-S61.    CBG: Recent Labs  Lab 12/26/23 0345 12/26/23 0746 12/26/23 1148 12/26/23 1624 12/26/23 1917  GLUCAP 136* 119* 151* 128* 101*    Review of Systems:   Review of Systems  Constitutional:  Positive for malaise/fatigue and weight loss. Negative for diaphoresis and fever.  HENT:  Positive for sore throat. Negative for congestion, nosebleeds and sinus pain.   Respiratory:  Positive for cough, sputum production and shortness of breath. Negative for hemoptysis, wheezing and stridor.   Cardiovascular:  Negative for chest pain, palpitations, orthopnea, claudication, leg swelling and PND.  Gastrointestinal:  Positive for heartburn. Negative for blood in stool, diarrhea and melena.  Genitourinary:  Negative for dysuria, frequency and urgency.  Musculoskeletal:  Negative for myalgias.  Skin:  Negative for itching and rash.   Past Medical History:  She,  has a past medical history of Anxiety, Benign liver cyst, Chronic intestinal pseudo-obstruction, GERD (gastroesophageal reflux disease), History of UTI, and Nausea and vomiting (04/28/2020).   Surgical History:   Past Surgical History:  Procedure Laterality Date   BIOPSY  05/20/2020   Procedure: BIOPSY;  Surgeon: Celedonio Coil, MD;  Location: [facility];  Service: Endoscopy;;  CESAREAN SECTION     twice 2010, 2013   CHOLECYSTECTOMY     ESOPHAGOGASTRODUODENOSCOPY (EGD) WITH PROPOFOL  N/A 05/20/2020   Procedure: ESOPHAGOGASTRODUODENOSCOPY (EGD) WITH PROPOFOL ;  Surgeon: Celedonio Coil, MD;  Location: [facility];  Service: Endoscopy;  Laterality: N/A;   Esure  ~2015   LAPAROTOMY N/A 10/29/2023   Procedure: LAPAROTOMY, EXPLORATORY; RIGHT EXTENDED  COLECTOMY;  Surgeon: Adalberto Acton, MD;  Location: [facility];  Service: General;  Laterality: N/A;   LAPAROTOMY N/A 10/31/2023   Procedure: RE-EXPLORATION OF ABDOMEN, CHOLECYSTECTOMY, ILEOCOLONIC ANASTOMOSIS;  Surgeon: Aldean Hummingbird, MD;  Location: [facility];  Service: General;  Laterality: N/A;     Social History:   reports that she has never smoked. She has never used smokeless tobacco. She reports that she does not drink alcohol and does not use drugs.   Family History:  Her family history includes CAD in her father; Diabetes in her brother and father; Hypertension in her father. There is no history of Colon cancer, Breast cancer, or Adrenal disorder.   Allergies Allergies  Allergen Reactions   Tetracyclines & Related Swelling    Swelling in spine; required spinal tap.    Erythromycin Other (See Comments)    Swelling in spine; required spinal tap   Raspberry Rash   Sulfa Antibiotics Rash    hives   Sulfonamide Derivatives Rash    Reaction to cream     Home Medications  Prior to Admission medications   Medication Sig Start Date End Date Taking? Authorizing Provider  acetaminophen  (TYLENOL ) 500 MG tablet Take 2 tablets (1,000 mg total) by mouth every 6 (six) hours as needed for mild pain (pain score 1-3), fever or headache. 11/15/23  Yes Noland Battles H, PA-C  cyanocobalamin  (,VITAMIN B-12,) 1000 MCG/ML injection Inject 1,000 mcg into the muscle every 30 (thirty) days.   Yes [provider]  megestrol  (MEGACE  ES) 625 MG/5ML suspension SHAKE LIQUID AND TAKE 5 ML(625 MG) BY MOUTH DAILY 10/24/23  Yes Tabori, Katherine E, MD  midodrine  (PROAMATINE ) 5 MG tablet Take 1 tablet (5 mg total) by mouth 3 (three) times daily with meals. 03/17/23  Yes Maudine Sos, MD  montelukast  (SINGULAIR ) 10 MG tablet Take 1 tablet (10 mg total) by mouth daily. 06/01/23  Yes Tabori, Katherine E, MD  Multiple Vitamin (MULTIVITAMIN WITH MINERALS) TABS tablet Take 1 tablet by mouth daily. Patient  taking differently: Take 1 tablet by mouth every evening. 05/29/20  Yes Armenta Landau, MD  ondansetron  (ZOFRAN -ODT) 4 MG disintegrating tablet Take 1 tablet (4 mg total) by mouth every 8 (eight) hours as needed for nausea or vomiting. 12/08/23  Yes Jess Morita, MD  pantoprazole  (PROTONIX ) 40 MG tablet Take 1 tablet (40 mg total) by mouth daily. Patient taking differently: Take 40 mg by mouth 2 (two) times daily. 12/13/23  Yes Zackowski, Scott, MD  polyethylene glycol (MIRALAX  / GLYCOLAX ) 17 g packet Take 17 g by mouth 3 (three) times daily. Patient taking differently: Take 17 g by mouth 3 (three) times daily as needed for moderate constipation. 05/28/20  Yes Armenta Landau, MD  traZODone  (DESYREL ) 50 MG tablet Take 0.5-1 tablets (25-50 mg total) by mouth at bedtime as needed for sleep. Patient taking differently: Take 50 mg by mouth at bedtime as needed for sleep. 12/08/23  Yes Tabori, Katherine E, MD  Vitamin D , Ergocalciferol , (DRISDOL ) 1.25 MG (50000 UNIT) CAPS capsule Take 50,000 Units by mouth once a week. 11/02/23  Yes [provider]  famotidine  (PEPCID )  40 MG/5ML suspension Take 5 mLs (40 mg total) by mouth daily. Patient not taking: Reported on 12/23/2023 12/12/23   Tabori, Katherine E, MD  fludrocortisone  (FLORINEF ) 0.1 MG tablet TAKE 1 TABLET(0.1 MG) BY MOUTH TWICE DAILY Patient not taking: Reported on 12/23/2023 07/25/23   Tabori, Katherine E, MD  potassium chloride  SA (KLOR-CON  M) 20 MEQ tablet Take 1 tablet (20 mEq total) by mouth daily. Patient not taking: Reported on 12/23/2023 12/13/23   Zackowski, Scott, MD  prochlorperazine (COMPAZINE) 25 MG suppository Place 25 mg rectally every 12 (twelve) hours as needed for nausea or vomiting. Patient not taking: Reported on 12/23/2023    [provider]     Madelynn Schilder, MD [facility]

## 2023-12-27 NOTE — Assessment & Plan Note (Signed)
-   History of colon perforation March 2025 -Difficulty with recent diet advancement -Currently on TPN and full liquids

## 2023-12-27 NOTE — Assessment & Plan Note (Signed)
-

## 2023-12-27 NOTE — Assessment & Plan Note (Signed)
-   Continue midodrine  -Mostly stable blood pressures

## 2023-12-27 NOTE — Progress Notes (Signed)
 Transition of Care [facility]) - Inpatient Brief Assessment   Patient Details  Name: Armonee Hubbert MRN: [id number] Date of Birth: [birth year]  [facility] CM/SW Contact:    Dane Dung, RN Phone Number: 12/27/2023, 3:02 PM   Clinical Narrative: CM met with the patient and spouse at the bedside to discuss TOC needs.  Patient lives with the spouse and plans to return home with continued home health services for PT/OT with [facility].  HH orders for PT/OT placed to be co-signed by the MD.  DME in the home including Rolator, shower chair.  Patient continues to have nausea/vomiting and remains on TPN at this time.  Patient is oxygen in the hospital with low Hgb currently per husband.  CM will continue to follow the patient for TOC needs.   Transition of Care Asessment: Insurance and Status: (P) Insurance coverage has been reviewed Patient has primary care physician: (P) Yes Home environment has been reviewed: (P) from home with spouse Prior level of function:: (P) Rolator Prior/Current Home Services: (P) Current home services (Active with [facility] for PT/OT) Social Drivers of Health Review: (P) SDOH reviewed needs interventions Readmission risk has been reviewed: (P) Yes Transition of care needs: (P) transition of care needs identified, TOC will continue to follow

## 2023-12-27 NOTE — Assessment & Plan Note (Signed)
-   Discussed with surgery while in the ICU with no interventions recommended

## 2023-12-27 NOTE — Assessment & Plan Note (Signed)
-   Refractory hypotension due to aspiration pneumonia -Weaned off Levophed  and vitals remaining stable

## 2023-12-27 NOTE — Progress Notes (Signed)
 Physical Therapy Treatment Patient Details Name: Stacy [name] MRN: [id number] DOB: 1981/07/28 Today's Date: 12/27/2023   History of Present Illness Pt is a 43 y/o female presenting to ED on 5/9 with severe heartburn, underwent EGD and developed n/v, hypoxia, hypotension, CT abd/pelvis with esophagitis. Pt with aspiration PNA leading to acute respiratory failure. 10/2023 wtih colon perforation, pericolic abscess, septic shock 2/2 peritonitis, s/p ex lap and R colectomy, developed postop ilius. Transfer to floor 5/12 overnight. PMH includes orthostatic hypotension, GERD/esophagitis    PT Comments  Pt received in supine eating lunch, pt agreeable to therapy session with emphasis on transfer and gait training at bedside, with goal of getting to chair at end of session. Pt standing tolerance limited due to hypoxia with trial of RA and possible orthostatic symptoms. Pt also had recently performed ambulation task in hallway with mobility specialist ~1 hour prior with c/o minimal fatigue prior to this session. Pt needing up to minA for transfer safety and gait with RW support. Pt encouraged to sit up in recliner 1-2 hours to build endurance and to build tolerance for OOB activity. Per RN, plan for Hgb re-check due to low reading in AM and may need to received blood products pending results. RN/MD notified pt would benefit from TED hose or BLE compression socks to see if this improves hemodynamic stability/standing tolerance for future sessions. Pt continues to benefit from PT services to progress toward functional mobility goals.     If plan is discharge home, recommend the following: A little help with walking and/or transfers;A little help with bathing/dressing/bathroom;Assistance with cooking/housework;Assist for transportation;Help with stairs or ramp for entrance   Can travel by private vehicle        Equipment Recommendations  None recommended by PT (TBD, may need supplemental O2 pending  progress)    Recommendations for Other Services       Precautions / Restrictions Precautions Precautions: Fall Recall of Precautions/Restrictions: Intact Precaution/Restrictions Comments: cortrak, history of orthostatic hypotension Required Braces or Orthoses:  (MD messaged to request TED hose order) Restrictions Weight Bearing Restrictions Per Provider Order: No     Mobility  Bed Mobility Overal bed mobility: Needs Assistance Bed Mobility: Supine to Sit     Supine to sit: Contact guard Sit to supine: Min assist, Used rails, HOB elevated   General bed mobility comments: to L EOB, increased time to perform, PTA assist with line mgmt/for safety.    Transfers Overall transfer level: Needs assistance Equipment used: Rolling walker (2 wheels) Transfers: Sit to/from Stand Sit to Stand: Min assist, Contact guard assist           General transfer comment: CGA to rise safely from L EOB, minA for stand>sit safety with cues for reaching for chair arm rests, pt c/o BLE weakness/fatigue with transfer    Ambulation/Gait Ambulation/Gait assistance: Min assist, +2 safety/equipment Gait Distance (Feet): 15 Feet Assistive device: Rolling walker (2 wheels) Gait Pattern/deviations: Step-through pattern, Decreased stride length Gait velocity: decreased; limited assessment in room     General Gait Details: Distance limited due to evolving c/o fatigue and BLE weakness; pt sitting on chair and once reclined, BP checked and found to be soft w/MAP (67). Pt tachy to 128 bpm w/standing activity and SpO2 83-86% on RA, improves to 88% on 1.5L O2 [place] with seated break post-exertion, needed 3L/min to improve to 92% and greater. MD/RN notified she may benefit from TED hose next session to see if it improves her hemodynamic stability. Pt keeping RW  too far advanced but also limited distance in room due to pt fatigue/evolving lightheadedness, plan to bring RW vs rollator next session to reinforce AD  mgmt/safety. Pt given gait belt for personal/staff use.   Stairs             Wheelchair Mobility     Tilt Bed    Modified Rankin (Stroke Patients Only)       Balance Overall balance assessment: Needs assistance Sitting-balance support: Feet supported Sitting balance-Leahy Scale: Good     Standing balance support: During functional activity, Bilateral upper extremity supported Standing balance-Leahy Scale: Poor Standing balance comment: BUE support, quick to fatigue today                            Communication Communication Communication: No apparent difficulties;Other (comment) Factors Affecting Communication:  (decreased self-reporting of symptoms, needs a lot of prompting to report how she is feeling when pt mobilizing.)  Cognition Arousal: Alert Behavior During Therapy: WFL for tasks assessed/performed, Flat affect   PT - Cognitive impairments: Difficult to assess Difficult to assess due to:  (pt often not stating her symptoms while mobilizing, unclear if due to slow processing or fatigue)                     PT - Cognition Comments: Spouse present and encouraging her. Following commands: Intact      Cueing Cueing Techniques: Verbal cues  Exercises      General Comments        Pertinent Vitals/Pain Pain Assessment Pain Assessment: Faces Faces Pain Scale: Hurts little more Pain Location: throat Pain Descriptors / Indicators: Discomfort, Grimacing, Sore Pain Intervention(s): Limited activity within patient's tolerance, Monitored during session, Repositioned (RN notified)    Home Living                          Prior Function            PT Goals (current goals can now be found in the care plan section) Acute Rehab PT Goals Patient Stated Goal: return home PT Goal Formulation: With patient/family Time For Goal Achievement: 01/09/24 Progress towards PT goals: Progressing toward goals    Frequency    Min  2X/week      PT Plan      Co-evaluation              AM-PAC PT "6 Clicks" Mobility   Outcome Measure  Help needed turning from your back to your side while in a flat bed without using bedrails?: A Little Help needed moving from lying on your back to sitting on the side of a flat bed without using bedrails?: A Little Help needed moving to and from a bed to a chair (including a wheelchair)?: A Little Help needed standing up from a chair using your arms (e.g., wheelchair or bedside chair)?: A Little Help needed to walk in hospital room?: A Lot (chair follow) Help needed climbing 3-5 steps with a railing? : Total 6 Click Score: 15    End of Session Equipment Utilized During Treatment: Oxygen;Gait belt Activity Tolerance: Treatment limited secondary to medical complications (Comment);Other (comment) (low BP, fatigue, likely orthostatic hypotension symptoms along with low Hgb) Patient left: with call bell/phone within reach;with family/visitor present;in chair;Other (comment) (pt needs chair alarm plugged in once RN is done changing her IV line (no room for PTA to do it, alarm pad  is under her in chair, spouse is present)) Nurse Communication: Mobility status;Other (comment) (soft BP, may need TED hose due to hx orthostatic hypotension (MD also notified)) PT Visit Diagnosis: Other abnormalities of gait and mobility (R26.89);Muscle weakness (generalized) (M62.81);Unsteadiness on feet (R26.81);Difficulty in walking, not elsewhere classified (R26.2)     Time: 7829-5621 PT Time Calculation (min) (ACUTE ONLY): 30 min  Charges:    $Gait Training: 8-22 mins $Therapeutic Activity: 8-22 mins PT General Charges $$ ACUTE PT VISIT: 1 Visit                     Trease Bremner P., PTA Acute Rehabilitation Services Secure Chat Preferred 9a-5:30pm Office: [phone]    Mariel Shope [facility] 12/27/2023, 1:40 PM

## 2023-12-27 NOTE — Assessment & Plan Note (Signed)
-   Found to have large amount of gastric contents on recent EGD with GI and appears patient aspirated; CT abdomen/pelvis noted bibasilar groundglass opacities concerning for aspiration -Continue Unasyn for at least 7 days - WBC downtrending; unclear if today's morning value is accurate but redrawing due to hemoglobin drop

## 2023-12-27 NOTE — Assessment & Plan Note (Signed)
-   From presumed aspiration pneumonia - Continue weaning oxygen off as able.  Not on oxygen at home

## 2023-12-27 NOTE — Assessment & Plan Note (Addendum)
-   GI following and managing -NG tube removed yesterday - Remains on TPN - Tolerating full liquids but abdomen remains distended; GI recommending to continue liquids for now and hold off on advancing diet - Continue Reglan

## 2023-12-27 NOTE — Hospital Course (Signed)
 Stacy [name] is a 43 yo female with PMH recent colon perforation s/p septic shock from peritonitis/diverticulitis requiring ex lap with right colectomy and primary ileocolonic anastomosis in March 2025.  Also PMH of chronic idiopathic constipation, chronic intestinal pseudoobstruction, chronic dysmotility, chronic orthostatic hypotension, GERD, anxiety. She had presented to the hospital after attempting to undergo EGD with GI but developed hypotension, hypoxia, and aspiration. She was rather managed by the ICU due to concern for potential septic shock and required Levophed . She improved and was transferred to TRH service on 5/13.

## 2023-12-27 NOTE — Assessment & Plan Note (Signed)
 Continue PPI ?

## 2023-12-27 NOTE — Progress Notes (Addendum)
 Nutrition Follow-up  DOCUMENTATION CODES:   Underweight, Severe malnutrition in context of chronic illness  INTERVENTION:   Continue TPN per pharmacy  Pharmacy to increase TPN to meet 1600-1800 kcal and 80-100 grams protein - Discussed with pharmacy  Recommend continuing TPN until Pt advanced to at least a soft diet and completing >50% of her meals or taking in most of her ONS on FLD Boost Breeze po TID, each supplement provides 250 kcal and 9 grams of protein Magic cup TID with meals, each supplement provides 290 kcal and 9 grams of protein Prosource Plus BID, each packet contains 100 kcal and 15 gm protein MVI with minerals PO daily  Continue IV Thiamine  100 mg x 5 days (to end 5/15)   Monitor tolerance of po intake. Even if pt able to tolerate some po, pt will likely still benefit from nutrition support (TPN vs EN) to improve nutrition and overall status   NUTRITION DIAGNOSIS:   Severe Malnutrition related to chronic illness, altered GI function as evidenced by severe muscle depletion, severe fat depletion, per patient/family report.  GOAL:   Patient will meet greater than or equal to 90% of their needs  - Meeting via TPN, progressing with PO diet   MONITOR:   PO intake, Supplement acceptance, Diet advancement, I & O's, Labs, Other (Comment) (TPN tolerance)  REASON FOR ASSESSMENT:   Consult New TPN/TNA  ASSESSMENT:   43 yo female admitted with N/V with wt loss and FTT with hx of chronic idiopathic constipation/chronic intestinal pseudo obstruction. Pt with hospitalization in March 2025 with colon perforation with peritonitis requiring R. Colectomy followed by Primary Ileocolonic anastomosis and cholecystectomy. Pt required TPN during this hospital stay. Pt with hx of GERD, Iron  def anemia, moderate malnutrition  5/09 Admitted, KUB: dilated small bowel  5/10 TPN initiation 5/11 CT: Persistent esophagitis and enterocolitis as well as ileus.  5/12 - FLD, NGT clamped.    5/13 - NGT removed  Patient seen and family at bedside. Pt started doing good 2 weeks after being discharged in March of 2025 and was able to tolerate eating solids foods. However, 2 weeks PTA family reports PT has not been able to keep any food down. On exam PT with severe muscle and fat wasting. Family endorses weight loss and states she usually weighs around 100-105 lbs last Doctors appointment a couple days ago she weighed 92lbs. Per EHR she has lost  8 lbs, 8%, in 3 months. This along with her severe wasting and poor intake has contributed to her severe malnutrition.   FLD started yesterday and NGT removed today. Has been able to tolerate small amount of full liquids. Reports feeling "very hungry." Had grits and an ice cream for breakfast with no nausea, vomiting or abdominal pain noted. Abdomen still distended. Had x 2 bowel movement yesterday after enema.   Family reports PT does not like any milk based supplements as they cause GI discomfort. Does not drink milk at baseline but does have milk based products with no issues. Has tried CIB with lactaid milk before and Pt did not tolerate. RD provided Boost Breeze to Pt, in which sheenjoyed. She is open to continuing them, trying magic cups and Prosource Plus to increase her PO intake. Family would like nutrition education closer to discharge on what foods to eat/avoid with her condition.   GI MD to hold off on advancing to softs at this time.   Noted pt was seen multiple times by Trenia Fritter RD (outpatient) in  2017-2018 for wt loss with possible concern for disordered eating.    Intake/Output Summary (Last 24 hours) at 12/27/2023 1502 Last data filed at 12/27/2023 1146 Gross per 24 hour  Intake 750.5 ml  Output 600 ml  Net 150.5 ml   Drains/Lines: NGT removed   Admit weight: 43.8 kg  Current weight: 49.2 kg    Average Meal Intake: 5/12: 50% intake x 1 recorded meals  Nutritionally Relevant Medications: Scheduled Meds:  (feeding  supplement) PROSource Plus  30 mL Oral BID BM   feeding supplement  1 Container Oral TID BM   metoCLOPramide  (REGLAN ) injection  10 mg Intravenous Q8H   midodrine   5 mg Oral TID WC   multivitamin with minerals  1 tablet Oral Daily   thiamine  (VITAMIN B1) injection  100 mg Intravenous Daily   Continuous Infusions:  ampicillin -sulbactam (UNASYN) IV 3 g (12/27/23 1122)   potassium chloride  10 mEq (12/27/23 1259)   TPN ADULT (ION) 60 mL/hr at 12/27/23 0311   TPN ADULT (ION)     Labs Reviewed: Potassium 3.4, Creatinine 0.35, Calcium  7.7, Albumin  <1.5, AST 11, Total protein 4.3,  Potassium and Mg replenished today  CBG ranges from 101-151 mg/dL over the last 24 hours No recent A1C  NUTRITION - FOCUSED PHYSICAL EXAM:  Flowsheet Row Most Recent Value  Orbital Region Moderate depletion  Upper Arm Region Severe depletion  Thoracic and Lumbar Region Severe depletion  Buccal Region Moderate depletion  Temple Region Moderate depletion  Clavicle Bone Region Severe depletion  Clavicle and Acromion Bone Region Severe depletion  Scapular Bone Region Severe depletion  Dorsal Hand Severe depletion  Patellar Region Severe depletion  Anterior Thigh Region Severe depletion  Posterior Calf Region Severe depletion  Edema (RD Assessment) None  Hair Reviewed  Eyes Reviewed  Mouth Reviewed  Skin Reviewed  Nails Reviewed       Diet Order:   Diet Order             Diet full liquid Room service appropriate? Yes; Fluid consistency: Thin  Diet effective now                   EDUCATION NEEDS:   Not appropriate for education at this time  Skin:  Skin Assessment: Reviewed RN Assessment  Last BM:  12/26/2023 type 6 after enema  Height:   Ht Readings from Last 1 Encounters:  12/23/23 5\' 3"  (1.6 m)    Weight:   Wt Readings from Last 1 Encounters:  12/27/23 49.2 kg    Ideal Body Weight:  52.3 kg  BMI:  Body mass index is 19.21 kg/m.  Estimated Nutritional Needs:   Kcal:   1600-1800 kcal  Protein:  80-100 gm  Fluid:  >1.6L   Frederik Jansky, RD Registered Dietitian  See Amion for more information

## 2023-12-27 NOTE — Progress Notes (Signed)
 Progress Note    Lakish Mockbee   GNF:[id number]  DOB: [birth year]  DOA: 12/23/2023     4 PCP: Jess Morita, MD  Initial CC: hypoxia, hypoTN, aspiration  Hospital Course: Ms. Camilleri is a 43 yo female with PMH recent colon perforation s/p septic shock from peritonitis/diverticulitis requiring ex lap with right colectomy and primary ileocolonic anastomosis in March 2025.  Also PMH of chronic idiopathic constipation, chronic intestinal pseudoobstruction, chronic dysmotility, chronic orthostatic hypotension, GERD, anxiety. She had presented to the hospital after attempting to undergo EGD with GI but developed hypotension, hypoxia, and aspiration. She was rather managed by the ICU due to concern for potential septic shock and required Levophed . She improved and was transferred to TRH service on 5/13.   Interval History:  No events overnight.  Has been tolerating full liquids.  Husband and mother present bedside. Still on approximately 2 L oxygen with some desaturation with ambulating.  Remains somewhat tachycardic.  Abdomen still distended but soft.  Enema effective recently.  Assessment and Plan: * Septic shock (HCC)-resolved as of 12/27/2023 - Refractory hypotension due to aspiration pneumonia -Weaned off Levophed  and vitals remaining stable  Aspiration pneumonia (HCC) - Found to have large amount of gastric contents on recent EGD with GI and appears patient aspirated; CT abdomen/pelvis noted bibasilar groundglass opacities concerning for aspiration -Continue Unasyn for at least 7 days - WBC downtrending; unclear if today's morning value is accurate but redrawing due to hemoglobin drop  Chronic idiopathic constipation - GI following and managing -NG tube removed yesterday - Remains on TPN - Tolerating full liquids but abdomen remains distended; GI recommending to continue liquids for now and hold off on advancing diet - Continue Reglan   Acute respiratory failure with  hypoxia (HCC) - From presumed aspiration pneumonia - Continue weaning oxygen off as able.  Not on oxygen at home  Iron  deficiency anemia - B12 and folate normal - Iron  stores still low; Given 2 doses of Venofer  (5/9, 5/10) - Hemoglobin had been stable with some downtrend over the past couple days -This morning hemoglobin returned at 5.2 g/dL; repeating for confirmation first; no reports of any acute blood loss and BP has not changed significantly from prior values (tachycardia presumed some deconditioning)  Severe protein-calorie malnutrition (HCC) - History of colon perforation March 2025 -Difficulty with recent diet advancement -Currently on TPN and full liquids  Chronic orthostatic hypotension - Continue midodrine  -Mostly stable blood pressures  Hypomagnesemia - Replete as needed  Hypokalemia - Replete as needed  Gastroesophageal reflux disease - Continue PPI  Chronic intestinal pseudo-obstruction - Discussed with surgery while in the ICU with no interventions recommended    Old records reviewed in assessment of this patient  Antimicrobials: Unasyn 12/23/2023 >> current  DVT prophylaxis:  heparin injection 5,000 Units Start: 12/25/23 1400 Place and maintain sequential compression device Start: 12/24/23 1255   Code Status:   Code Status: Full Code  Mobility Assessment (Last 72 Hours)     Mobility Assessment     Row Name 12/27/23 1255 12/26/23 2100 12/26/23 1930 12/26/23 1711 12/26/23 1200   Does patient have an order for bedrest or is patient medically unstable -- No - Continue assessment -- -- --   What is the highest level of mobility based on the progressive mobility assessment? Level 4 (Walks with assist in room) - Balance while marching in place and cannot step forward and back - Complete Level 5 (Walks with assist in room/hall) - Balance while stepping forward/back and  can walk in room with assist - Complete Level 5 (Walks with assist in room/hall) - Balance  while stepping forward/back and can walk in room with assist - Complete Level 4 (Walks with assist in room) - Balance while marching in place and cannot step forward and back - Complete Level 4 (Walks with assist in room) - Balance while marching in place and cannot step forward and back - Complete            Barriers to discharge: None Disposition Plan: Home HH orders placed: N/A Status is: Inpatient  Objective: Blood pressure (!) 94/56, pulse (!) 115, temperature 100 F (37.8 C), temperature source Oral, resp. rate 20, height 5\' 3"  (1.6 m), weight 49.2 kg, last menstrual period 10/29/2023, SpO2 90%.  Examination:  Physical Exam Constitutional:      Appearance: Normal appearance.  HENT:     Head: Normocephalic and atraumatic.     Mouth/Throat:     Mouth: Mucous membranes are moist.  Eyes:     Extraocular Movements: Extraocular movements intact.  Cardiovascular:     Rate and Rhythm: Normal rate and regular rhythm.  Pulmonary:     Effort: Pulmonary effort is normal. No respiratory distress.     Breath sounds: Rhonchi (bibasilar) present. No wheezing.  Abdominal:     General: Bowel sounds are decreased. There is distension.     Palpations: Abdomen is soft.     Tenderness: There is no abdominal tenderness.  Musculoskeletal:        General: Normal range of motion.     Cervical back: Normal range of motion and neck supple.  Skin:    General: Skin is warm and dry.  Neurological:     General: No focal deficit present.     Mental Status: She is alert.  Psychiatric:        Mood and Affect: Mood normal.      Consultants:  GI  Procedures:    Data Reviewed: Results for orders placed or performed during the hospital encounter of 12/23/23 (from the past 24 hours)  Potassium     Status: None   Collection Time: 12/26/23  2:15 PM  Result Value Ref Range   Potassium 3.5 3.5 - 5.1 mmol/L  Glucose, capillary     Status: Abnormal   Collection Time: 12/26/23  4:24 PM  Result  Value Ref Range   Glucose-Capillary 128 (H) 70 - 99 mg/dL  Glucose, capillary     Status: Abnormal   Collection Time: 12/26/23  7:17 PM  Result Value Ref Range   Glucose-Capillary 101 (H) 70 - 99 mg/dL  Renal function panel     Status: Abnormal   Collection Time: 12/27/23 10:57 AM  Result Value Ref Range   Sodium 137 135 - 145 mmol/L   Potassium 3.4 (L) 3.5 - 5.1 mmol/L   Chloride 101 98 - 111 mmol/L   CO2 30 22 - 32 mmol/L   Glucose, Bld 117 (H) 70 - 99 mg/dL   BUN 10 6 - 20 mg/dL   Creatinine, Ser 5.40 (L) 0.44 - 1.00 mg/dL   Calcium  7.7 (L) 8.9 - 10.3 mg/dL   Phosphorus 3.3 2.5 - 4.6 mg/dL   Albumin  <1.5 (L) 3.5 - 5.0 g/dL   GFR, Estimated >98 >11 mL/min   Anion gap 6 5 - 15  Basic metabolic panel     Status: Abnormal   Collection Time: 12/27/23 10:57 AM  Result Value Ref Range   Sodium 136 135 - 145  mmol/L   Potassium 3.3 (L) 3.5 - 5.1 mmol/L   Chloride 100 98 - 111 mmol/L   CO2 30 22 - 32 mmol/L   Glucose, Bld 117 (H) 70 - 99 mg/dL   BUN 10 6 - 20 mg/dL   Creatinine, Ser <1.61 (L) 0.44 - 1.00 mg/dL   Calcium  7.7 (L) 8.9 - 10.3 mg/dL   GFR, Estimated NOT CALCULATED >60 mL/min   Anion gap 6 5 - 15  Magnesium      Status: None   Collection Time: 12/27/23 10:57 AM  Result Value Ref Range   Magnesium  1.7 1.7 - 2.4 mg/dL  CBC with Differential/Platelet     Status: Abnormal   Collection Time: 12/27/23 10:58 AM  Result Value Ref Range   WBC 6.2 4.0 - 10.5 K/uL   RBC 1.93 (L) 3.87 - 5.11 MIL/uL   Hemoglobin 5.2 (LL) 12.0 - 15.0 g/dL   HCT 09.6 (L) 04.5 - 40.9 %   MCV 86.5 80.0 - 100.0 fL   MCH 26.9 26.0 - 34.0 pg   MCHC 31.1 30.0 - 36.0 g/dL   RDW 81.1 91.4 - 78.2 %   Platelets 357 150 - 400 K/uL   nRBC 0.0 0.0 - 0.2 %   Neutrophils Relative % 67 %   Neutro Abs 4.2 1.7 - 7.7 K/uL   Lymphocytes Relative 19 %   Lymphs Abs 1.2 0.7 - 4.0 K/uL   Monocytes Relative 11 %   Monocytes Absolute 0.7 0.1 - 1.0 K/uL   Eosinophils Relative 1 %   Eosinophils Absolute 0.1 0.0 -  0.5 K/uL   Basophils Relative 0 %   Basophils Absolute 0.0 0.0 - 0.1 K/uL   Immature Granulocytes 2 %   Abs Immature Granulocytes 0.10 (H) 0.00 - 0.07 K/uL    I have reviewed pertinent nursing notes, vitals, labs, and images as necessary. I have ordered labwork to follow up on as indicated.  I have reviewed the last notes from staff over past 24 hours. I have discussed patient's care plan and test results with nursing staff, CM/SW, and other staff as appropriate.  Time spent: Greater than 50% of the 55 minute visit was spent in counseling/coordination of care for the patient as laid out in the A&P.   LOS: 4 days   Faith Homes, MD [facility] 12/27/2023, 1:55 PM

## 2023-12-27 NOTE — Assessment & Plan Note (Addendum)
-   B12 and folate normal - Iron  stores still low; Given 2 doses of Venofer  (5/9, 5/10) - Hemoglobin had been stable with some downtrend over the past couple days -This morning hemoglobin returned at 5.2 g/dL; repeating for confirmation first; no reports of any acute blood loss and BP has not changed significantly from prior values (tachycardia presumed some deconditioning)

## 2023-12-27 NOTE — Progress Notes (Signed)
 PHARMACY - TOTAL PARENTERAL NUTRITION CONSULT NOTE   Indication: Prolonged ileus  Patient Measurements: Height: 5\' 3"  (160 cm) Weight: 49.2 kg (108 lb 7.5 oz) IBW/kg (Calculated) : 52.4 TPN AdjBW (KG): 43.8 Body mass index is 19.21 kg/m. Usual Weight: ~110 lbs  Assessment:  43 yo W with nausea and vomiting for 2 weeks with at least 12 lbs weight loss due to poor PO intake. In March 2025, patient had colon perforation, pericolic abscess, diverticulitis, peritonitis, and septic shock s/p ex lap and right colectomy followed by ileocolic anastomosis and cholecystectomy.  Patient required TPN 3/18- 3/31 due to post-op ileus. CT on 4/29 was suspicious for ileus. Patient had an outpatient EGD 12/23/23 complicated by aspiration and hypotension. Pharmacy consulted for TPN.   Glucose / Insulin : CBG <160, used 6 units SSI/24 hr> off SSI Electrolytes:  K 3.4 (received 60 mEq IV), Cl 101 down, CO2 30 up, CoCa 9.7, Mg 1.7 (received 4g IV), others wnl Renal: Scr 0.35 (CL 0.4 - 0.6), BUN 10 (from 47) Hepatic: albumin  <1.5, alk phos/AST/ALT/Tbili wnl Intake / Output; MIVF: UOP 0.8 ml/kg/hr, LBM x2 5/12, Net + 4.9L Reglan  5/9 >> Enema 5/11, 5/12  GI imaging: 4/29 CT: Mild-to-moderate diffuse small bowel dilatation, without definite transition point or significant interval change, suspicious for ileus. 5/9 KUB: dilated small bowel  5/11 CT: Persistent esophagitis and enterocolitis as well as ileus.  GI procedures: none this admit  Central access: CVC 5/9  TPN start date: 5/10   Nutritional Goals: Goal TPN rate is 60 mL/hr (55g/L AA, 15% dextrose , 32 g/L lipids) provides 79 g of protein and 1511 kcals per day  RD Assessment: Estimated Needs Total Energy Estimated Needs: 1500-1600 kcals Total Protein Estimated Needs: 75-85 g Total Fluid Estimated Needs: 1.5 L  Current Nutrition:  TPN 5/12 Clamp NGT, CLD > FLD- ate most of 1 bowl of grits and half an ice cream 5/13: FLD   Plan:  Continue TPN  at 60 mL/hr at 1800, goal rate providing 100% of needs  Electrolytes in TPN: increase Na 75 mEq/L, increase K 70 mEq/L, Ca 5 mEq/L, increase Mg 15 mEq/L (Max), and Phos 15 mmol/L. increase Cl:Acet 2:1 Give Kcl IV 40 mEq and Mag 4g IV x1  Remove standard MVI and trace elements to TPN, give as po tablet Monitor TPN labs on Mon/Thurs, and PRN F/u po intake and wean TPN as able  Dorene Gang, PharmD, BCPS, BCCP Clinical Pharmacist  Please check AMION for all [facility] Pharmacy phone numbers After 10:00 PM, call Main Pharmacy [phone]

## 2023-12-28 DIAGNOSIS — K5904 Chronic idiopathic constipation: Secondary | ICD-10-CM | POA: Diagnosis not present

## 2023-12-28 DIAGNOSIS — I9589 Other hypotension: Secondary | ICD-10-CM

## 2023-12-28 DIAGNOSIS — A419 Sepsis, unspecified organism: Secondary | ICD-10-CM | POA: Diagnosis not present

## 2023-12-28 DIAGNOSIS — R6521 Severe sepsis with septic shock: Secondary | ICD-10-CM | POA: Diagnosis not present

## 2023-12-28 DIAGNOSIS — J9601 Acute respiratory failure with hypoxia: Secondary | ICD-10-CM | POA: Diagnosis not present

## 2023-12-28 DIAGNOSIS — K5989 Other specified functional intestinal disorders: Secondary | ICD-10-CM

## 2023-12-28 DIAGNOSIS — J690 Pneumonitis due to inhalation of food and vomit: Secondary | ICD-10-CM | POA: Diagnosis not present

## 2023-12-28 LAB — CBC
HCT: 21.7 % — ABNORMAL LOW (ref 36.0–46.0)
Hemoglobin: 7 g/dL — ABNORMAL LOW (ref 12.0–15.0)
MCH: 27.5 pg (ref 26.0–34.0)
MCHC: 32.3 g/dL (ref 30.0–36.0)
MCV: 85.1 fL (ref 80.0–100.0)
Platelets: 255 10*3/uL (ref 150–400)
RBC: 2.55 MIL/uL — ABNORMAL LOW (ref 3.87–5.11)
RDW: 16 % — ABNORMAL HIGH (ref 11.5–15.5)
WBC: 5.6 10*3/uL (ref 4.0–10.5)
nRBC: 0.4 % — ABNORMAL HIGH (ref 0.0–0.2)

## 2023-12-28 LAB — CULTURE, BLOOD (ROUTINE X 2)
Culture: NO GROWTH
Culture: NO GROWTH
Special Requests: ADEQUATE

## 2023-12-28 LAB — RENAL FUNCTION PANEL
Albumin: <1.5 g/dL — ABNORMAL LOW (ref 3.5–5.0)
Anion gap: 4 — ABNORMAL LOW (ref 5–15)
BUN: 8 mg/dL (ref 6–20)
CO2: 32 mmol/L (ref 22–32)
Calcium: 7.7 mg/dL — ABNORMAL LOW (ref 8.9–10.3)
Chloride: 101 mmol/L (ref 98–111)
Creatinine, Ser: 0.33 mg/dL — ABNORMAL LOW (ref 0.44–1.00)
GFR, Estimated: >60 mL/min (ref >60)
Glucose, Bld: 124 mg/dL — ABNORMAL HIGH (ref 70–99)
Phosphorus: 3.7 mg/dL (ref 2.5–4.6)
Potassium: 3.9 mmol/L (ref 3.5–5.1)
Sodium: 137 mmol/L (ref 135–145)

## 2023-12-28 LAB — PREPARE RBC (CROSSMATCH)

## 2023-12-28 LAB — MAGNESIUM: Magnesium: 2 mg/dL (ref 1.7–2.4)

## 2023-12-28 LAB — HEMOGLOBIN AND HEMATOCRIT, BLOOD
HCT: 25 % — ABNORMAL LOW (ref 36.0–46.0)
Hemoglobin: 8 g/dL — ABNORMAL LOW (ref 12.0–15.0)

## 2023-12-28 MED ORDER — TRAVASOL 10 % IV SOLN
INTRAVENOUS | Status: AC
  Filled 2023-12-28: qty 858

## 2023-12-28 MED ORDER — FLUDROCORTISONE ACETATE 0.1 MG PO TABS
0.1000 mg | ORAL_TABLET | Freq: Two times a day (BID) | ORAL | Status: DC
  Administered 2023-12-28 – 2023-12-29 (×4): 0.1 mg
  Filled 2023-12-28 (×5): qty 1

## 2023-12-28 MED ORDER — TRAVASOL 10 % IV SOLN
INTRAVENOUS | Status: DC
  Filled 2023-12-28: qty 858

## 2023-12-28 MED ORDER — THIAMINE MONONITRATE 100 MG PO TABS
100.0000 mg | ORAL_TABLET | Freq: Every day | ORAL | Status: AC
  Administered 2023-12-28: 100 mg via ORAL
  Filled 2023-12-28: qty 1

## 2023-12-28 MED ORDER — THIAMINE MONONITRATE 100 MG PO TABS: 100.0000 mg | ORAL_TABLET | Freq: Every day | ORAL | Status: DC

## 2023-12-28 MED ORDER — MIDODRINE HCL 5 MG PO TABS
10.0000 mg | ORAL_TABLET | Freq: Three times a day (TID) | ORAL | Status: DC
  Administered 2023-12-28 – 2024-01-01 (×11): 10 mg via ORAL
  Filled 2023-12-28 (×11): qty 2

## 2023-12-28 MED ORDER — SODIUM CHLORIDE 0.9% IV SOLUTION: Freq: Once | INTRAVENOUS | Status: AC

## 2023-12-28 MED ORDER — LACTATED RINGERS IV BOLUS
1000.0000 mL | Freq: Once | INTRAVENOUS | Status: AC
  Administered 2023-12-28: 1000 mL via INTRAVENOUS

## 2023-12-28 MED ORDER — PANTOPRAZOLE SODIUM 40 MG PO TBEC: 40.0000 mg | DELAYED_RELEASE_TABLET | Freq: Two times a day (BID) | ORAL | Status: DC

## 2023-12-28 MED ORDER — PANTOPRAZOLE SODIUM 40 MG IV SOLR
40.0000 mg | Freq: Two times a day (BID) | INTRAVENOUS | Status: DC
  Administered 2023-12-28 – 2024-01-01 (×10): 40 mg via INTRAVENOUS
  Filled 2023-12-28 (×10): qty 10

## 2023-12-28 NOTE — Progress Notes (Signed)
 OT Cancellation Note  Patient Details Name: Stacy [name] MRN: [id number] DOB: 1981/04/16   Cancelled Treatment:    Reason Eval/Treat Not Completed: Medical issues which prohibited therapy (pt hypotensive this AM, will hold and follow up later today as appropriate/schedule permitting.)  Maor Meckel K, OTD, OTR/L [facility] [phone]   [name] [name] 12/28/2023, 10:12 AM

## 2023-12-28 NOTE — Progress Notes (Signed)
 [facility] Gastroenterology Progress Note  Stacy [name] 43 y.o. August 11, 1981  CC: Nausea, vomiting, poor oral intake, pneumonia   Subjective: Patient seen and examined at bedside.  Husband at bedside.  Patient had worsening symptoms yesterday requiring NG tube placement.  Last bowel movement this morning.  ROS : Negative for bleeding.   Objective: Vital signs in last 24 hours: Vitals:   12/28/23 0851 12/28/23 1044  BP: (!) 86/61 (!) 83/53  Pulse:  (!) 117  Resp: (!) 22 (!) 22  Temp:  98 F (36.7 C)  SpO2: 96% 98%    Physical Exam:  Resting in the bed, not in acute distress.  NG tube in place.  Abdomen remains distended with hypoactive bowel sounds.  No peritoneal signs.  Mood and affect normal but somewhat concerned.  Alert and oriented x 3.  Lab Results: Recent Labs    12/27/23 1057 12/28/23 0432  NA 136  137 137  K 3.3*  3.4* 3.9  CL 100  101 101  CO2 30  30 32  GLUCOSE 117*  117* 124*  BUN 10  10 8   CREATININE <0.30*  0.35* 0.33*  CALCIUM  7.7*  7.7* 7.7*  MG 1.7 2.0  PHOS 3.3 3.7   Recent Labs    12/26/23 0437 12/27/23 1057 12/28/23 0432  AST 11*  --   --   ALT 16  --   --   ALKPHOS 58  --   --   BILITOT 0.8  --   --   PROT 4.3*  --   --   ALBUMIN  <1.5* <1.5* <1.5*   Recent Labs    12/27/23 1058 12/27/23 1318 12/28/23 0800  WBC 6.2 6.5 5.6  NEUTROABS 4.2  --   --   HGB 5.2* 5.4* 7.0*  HCT 16.7* 17.7* 21.7*  MCV 86.5 87.2 85.1  PLT 357 373 255   No results for input(s): "LABPROT", "INR" in the last 72 hours.     Assessment/Plan: -Aspiration pneumonia with acute hypoxic respiratory failure. 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  Now on TPN. -Anemia.  Hemoglobin 7.0     Recommendations --------------------------- - Recommend n.p.o. for next 24 hours except for sips of water to moisten mouth. - Continue Reglan  10 mg IV every 8 hours.  She is allergic to erythromycin. - Stop all narcotics - Ambulate with assistant to help with GI motility. - Patient likely has significant motility disorder affecting entire GI system resulting in retained food in esophagus, retained food in the stomach as well as chronic constipation. -Continue other supportive care   -GI will follow     Felecia Hopper MD, FACP 12/28/2023, 11:47 AM  Contact #  [phone]

## 2023-12-28 NOTE — Plan of Care (Signed)

## 2023-12-28 NOTE — Progress Notes (Signed)
 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 She was sent to ED for eval. Currently, she is on NRM 15 L, SpO2 94%, having chills, SOB, tachycardic, and tachypneic. She has N/V for 2 weeks and underwent eval in ED one 10 days ago with CT abd/pelvis:  Increased diffuse wall thickening of visualized distal thoracic esophagus, consistent with esophagitis. Mild-to-moderate diffuse small bowel dilatation, without definite transition point or significant interval change, suspicious for ileus. Mild improvement in diffuse small bowel wall thickening, suggesting improving enteritis. No evidence of abscess.   Her last BM was 4 days ago, soft. Denied f/c/r before EGD. Denied abd pain or worsening distension. Compliant with meds. No alcohol drinking, smoking, or illicit drug use. She gained after discharge but in the last 2 weeks, she has poor intake.    In October 29, 2023, she has colon perforation, pericolic abscess, diverticulitis, septic shock due to peritonitis, s/p ex lap and right colectomy, followed by ex lap with primary ileocolonic anastomosis and cholecystectomy. She developed post-operative Ileus, and she was placed on TPN for almost 10 days  before resuming oral intake.   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 5/10: On Levophed  3 mcg/min, RL 125 cc/hr, positive 1.8 fluid balance, feels better 5/11: N/V yesterday evening after eating and NGT had to be re inserted, NGT output in 50 cc only. Good UOP. Positive 1.3 L fluid balance in the last 24 hrs. Off Levophed . On TPN. NPO. On 12 L [place]. No BM or passing gas  5/12: has BM today and she wants to eat. NGT 440 cc in the last 24 hrs. On [place] 5 L. On TPN 5/13: She has abd distension and burping leading to vomiting four times and hospitalist order NGT. On TPN. 3 L [place] 5/14: dizziness when wants to stand or walk. NGT output 1800+1900 cc,  has abd stable distension and burping leading to vomiting, On TPN. 3 L [place]. Passing small BM  Interim History / Subjective:    Objective    Blood pressure (!) 83/53, pulse (!) 117, temperature 98 F (36.7 C), temperature source Oral, resp. rate (!) 22, height 5\' 3"  (1.6 m), weight 49.6 kg, last menstrual period 10/29/2023, SpO2 98%.        Intake/Output Summary (Last 24 hours) at 12/28/2023 1242 Last data filed at 12/28/2023 1136 Gross per 24 hour  Intake 3326.59 ml  Output 3850  ml  Net -523.41 ml   Filed Weights   12/26/23 0422 12/27/23 0420 12/28/23 0500  Weight: 48.9 kg 49.2 kg 49.6 kg    Examination: General: alert, oriented x4, and mild resp distress. [place] 3 L, SpO2 98%  HENT: PERL, normal pharynx and oral mucosa. No LNE or thyromegaly. No JVD Lungs: symmetrical air entry bilaterally. No crackles or  wheezing Cardiovascular: NL S1/S2. No m/g/r Abdomen: more distension, gaseous. No tenderness. +BS  Extremities: no edema. Symmetrical  Neuro: nonfocal   PICC line   Resolved Hospital Problem list     Assessment & Plan:  Aspiration PNA leading to acute hypoxic resp failure Septic shock vs chronic orthostatic hypotension (on Florinef  & Midodrine ) -Unasyn, day#6 -MRSA screening: negative -Sputum Cx -Bcx2: no growth  -PCT: high -Duoneb  -I/S -CVC/a-line (right femoral area): d/c 12/25/2023, PICC was inserted -Increase Midodrine   -Encourage ambulation, d/w family   -PCT in am  -12/24/2023, Echo: EF 60%     GERD/esophagitis (on PPI/H2B) -Continue PPI bid -Reglan , allergic to erythromycin  -Miralax   -s/p [place] Enema x2 per GI in the last few days -Gas-X   Fe def anemia Moderate PCM Vit B12 and folate: NL -IV Fe, s/p two doses  -Glycemic control -On TPN -Serial labs   Chronic idiopathic constipation/chronic intestinal pseudoobstruction  -Consult GI surgery: called but they had nothing extra to offer for now -GI on board: appreciated and noted their recommendations  -I/O chart -NGT  Best Practice (right click and "Reselect all SmartList Selections" daily)   Diet/type: TPN DVT prophylaxis prophylactic heparin  Pressure ulcer(s): N/A GI prophylaxis: PPI Lines: PICC line  Foley:  N/A Code Status:  full code Last date of multidisciplinary goals of care discussion []   Labs   CBC: Recent Labs  Lab 12/22/23 1048 12/23/23 1441 12/23/23 1447 12/23/23 1449 12/24/23 0226 12/25/23 0444 12/27/23 1058 12/27/23 1318 12/28/23 0800  WBC 12.6*  --  5.8  --  26.2* 16.9* 6.2 6.5 5.6  NEUTROABS 8.3*  --  4.2  --   --   --  4.2  --   --   HGB 8.7*   < > 9.9*   < > 8.4* 7.1* 5.2* 5.4* 7.0*  HCT 27.3*   < > 33.0*   < > 26.4* 22.2* 16.7* 17.7* 21.7*  MCV 86.4  --  92.2  --  86.6 85.7 86.5 87.2 85.1  PLT 745.0*  --  731*  --  651* 471* 357 373 255   < > = values in this  interval not displayed.    Basic Metabolic Panel: Recent Labs  Lab 12/24/23 0226 12/25/23 0444 12/26/23 0437 12/26/23 1415 12/27/23 1057 12/28/23 0432  NA 137 141 141  --  136  137 137  K 3.3* 3.2* 3.0* 3.5 3.3*  3.4* 3.9  CL 110 113* 110  --  100  101 101  CO2 15* 21* 24  --  30  30 32  GLUCOSE 129* 146* 145*  --  117*  117* 124*  BUN 47* 23* 12  --  10  10 8   CREATININE 1.03* 0.43* 0.39*  --  <0.30*  0.35* 0.33*  CALCIUM  7.8* 8.1* 7.6*  --  7.7*  7.7* 7.7*  MG 2.1 1.9 1.6*  --  1.7 2.0  PHOS 5.1* 2.5 3.4  --  3.3 3.7   GFR: Estimated Creatinine Clearance: 71 mL/min (A) (by C-G formula based on SCr of 0.33 mg/dL (L)). Recent Labs  Lab 12/23/23 1442 12/23/23  1447 12/24/23 0226 12/25/23 0444 12/27/23 1058 12/27/23 1318 12/28/23 0800  PROCALCITON  --  0.50  --   --   --   --   --   WBC  --  5.8   < > 16.9* 6.2 6.5 5.6  LATICACIDVEN 1.5  --   --   --   --   --   --    < > = values in this interval not displayed.    Liver Function Tests: Recent Labs  Lab 12/22/23 1048 12/23/23 1447 12/25/23 0444 12/26/23 0437 12/27/23 1057 12/28/23 0432  AST 14 25  --  11*  --   --   ALT 33 29  --  16  --   --   ALKPHOS 70 73  --  58  --   --   BILITOT 0.5 1.6*  --  0.8  --   --   PROT 6.3 6.1*  --  4.3*  --   --   ALBUMIN  2.9* 2.3* 1.6* <1.5* <1.5* <1.5*   No results for input(s): "LIPASE", "AMYLASE" in the last 168 hours. No results for input(s): "AMMONIA" in the last 168 hours.  ABG    Component Value Date/Time   PHART 7.35 10/29/2023 1828   PCO2ART 45 10/29/2023 1828   PO2ART 275 (H) 10/29/2023 1828   HCO3 15.1 (L) 12/23/2023 1449   TCO2 16 (L) 12/23/2023 1449   ACIDBASEDEF 9.0 (H) 12/23/2023 1449   O2SAT 58 12/23/2023 1449     Coagulation Profile: Recent Labs  Lab 12/23/23 1447  INR 1.5*    Cardiac Enzymes: No results for input(s): "CKTOTAL", "CKMB", "CKMBINDEX", "TROPONINI" in the last 168 hours.  HbA1C: Hgb A1c MFr Bld  Date/Time Value Ref  Range Status  07/28/2013 04:30 AM 5.1 <5.7 % Final    Comment:    (NOTE)                                                                       According to the ADA Clinical Practice Recommendations for 2011, when HbA1c is used as a screening test:  >=6.5%   Diagnostic of Diabetes Mellitus           (if abnormal result is confirmed) 5.7-6.4%   Increased risk of developing Diabetes Mellitus References:Diagnosis and Classification of Diabetes Mellitus,Diabetes Care,2011,34(Suppl 1):S62-S69 and Standards of Medical Care in         Diabetes - 2011,Diabetes Care,2011,34 (Suppl 1):S11-S61.    CBG: Recent Labs  Lab 12/26/23 0345 12/26/23 0746 12/26/23 1148 12/26/23 1624 12/26/23 1917  GLUCAP 136* 119* 151* 128* 101*    Review of Systems:   Review of Systems  Constitutional:  Positive for malaise/fatigue and weight loss. Negative for diaphoresis and fever.  HENT:  Positive for sore throat. Negative for congestion, nosebleeds and sinus pain.   Respiratory:  Positive for cough, sputum production and shortness of breath. Negative for hemoptysis, wheezing and stridor.   Cardiovascular:  Negative for chest pain, palpitations, orthopnea, claudication, leg swelling and PND.  Gastrointestinal:  Positive for heartburn. Negative for blood in stool, diarrhea and melena.  Genitourinary:  Negative for dysuria, frequency and urgency.  Musculoskeletal:  Negative for myalgias.  Skin:  Negative for itching and rash.  Past Medical History:  She,  has a past medical history of Anxiety, Benign liver cyst, Chronic intestinal pseudo-obstruction, GERD (gastroesophageal reflux disease), History of UTI, and Nausea and vomiting (04/28/2020).   Surgical History:   Past Surgical History:  Procedure Laterality Date   BIOPSY  05/20/2020   Procedure: BIOPSY;  Surgeon: Celedonio Coil, MD;  Location: [facility];  Service: Endoscopy;;   CESAREAN SECTION     twice 2010, 2013   CHOLECYSTECTOMY      ESOPHAGOGASTRODUODENOSCOPY (EGD) WITH PROPOFOL  N/A 05/20/2020   Procedure: ESOPHAGOGASTRODUODENOSCOPY (EGD) WITH PROPOFOL ;  Surgeon: Celedonio Coil, MD;  Location: [facility];  Service: Endoscopy;  Laterality: N/A;   [place]ure  ~2015   LAPAROTOMY N/A 10/29/2023   Procedure: LAPAROTOMY, EXPLORATORY; RIGHT EXTENDED COLECTOMY;  Surgeon: Adalberto Acton, MD;  Location: [facility];  Service: General;  Laterality: N/A;   LAPAROTOMY N/A 10/31/2023   Procedure: RE-EXPLORATION OF ABDOMEN, CHOLECYSTECTOMY, ILEOCOLONIC ANASTOMOSIS;  Surgeon: Aldean Hummingbird, MD;  Location: [facility];  Service: General;  Laterality: N/A;     Social History:   reports that she has never smoked. She has never used smokeless tobacco. She reports that she does not drink alcohol and does not use drugs.   Family History:  Her family history includes CAD in her father; Diabetes in her brother and father; Hypertension in her father. There is no history of Colon cancer, Breast cancer, or Adrenal disorder.   Allergies Allergies  Allergen Reactions   Tetracyclines & Related Swelling    Swelling in spine; required spinal tap.    Erythromycin Other (See Comments)    Swelling in spine; required spinal tap   Raspberry Rash   Sulfa Antibiotics Rash    hives   Sulfonamide Derivatives Rash    Reaction to cream     Home Medications  Prior to Admission medications   Medication Sig Start Date End Date Taking? Authorizing Provider  acetaminophen  (TYLENOL ) 500 MG tablet Take 2 tablets (1,000 mg total) by mouth every 6 (six) hours as needed for mild pain (pain score 1-3), fever or headache. 11/15/23  Yes Elwin Hammond, PA-C  cyanocobalamin  (,VITAMIN B-12,) 1000 MCG/ML injection Inject 1,000 mcg into the muscle every 30 (thirty) days.   Yes [provider]  megestrol  (MEGACE  ES) 625 MG/5ML suspension SHAKE LIQUID AND TAKE 5 ML(625 MG) BY MOUTH DAILY 10/24/23  Yes Tabori, Katherine E, MD  midodrine  (PROAMATINE ) 5 MG tablet Take 1  tablet (5 mg total) by mouth 3 (three) times daily with meals. 03/17/23  Yes Maudine Sos, MD  montelukast  (SINGULAIR ) 10 MG tablet Take 1 tablet (10 mg total) by mouth daily. 06/01/23  Yes Tabori, Katherine E, MD  Multiple Vitamin (MULTIVITAMIN WITH MINERALS) TABS tablet Take 1 tablet by mouth daily. Patient taking differently: Take 1 tablet by mouth every evening. 05/29/20  Yes Armenta Landau, MD  ondansetron  (ZOFRAN -ODT) 4 MG disintegrating tablet Take 1 tablet (4 mg total) by mouth every 8 (eight) hours as needed for nausea or vomiting. 12/08/23  Yes Jess Morita, MD  pantoprazole  (PROTONIX ) 40 MG tablet Take 1 tablet (40 mg total) by mouth daily. Patient taking differently: Take 40 mg by mouth 2 (two) times daily. 12/13/23  Yes Zackowski, Scott, MD  polyethylene glycol (MIRALAX  / GLYCOLAX ) 17 g packet Take 17 g by mouth 3 (three) times daily. Patient taking differently: Take 17 g by mouth 3 (three) times daily as needed for moderate constipation. 05/28/20  Yes Armenta Landau, MD  traZODone  (DESYREL ) 50 MG tablet Take 0.5-1 tablets (25-50 mg total) by mouth at bedtime as needed for sleep. Patient taking differently: Take 50 mg by mouth at bedtime as needed for sleep. 12/08/23  Yes Tabori, Katherine E, MD  Vitamin D , Ergocalciferol , (DRISDOL ) 1.25 MG (50000 UNIT) CAPS capsule Take 50,000 Units by mouth once a week. 11/02/23  Yes [provider]  famotidine  (PEPCID ) 40 MG/5ML suspension Take 5 mLs (40 mg total) by mouth daily. Patient not taking: Reported on 12/23/2023 12/12/23   Tabori, Katherine E, MD  fludrocortisone  (FLORINEF ) 0.1 MG tablet TAKE 1 TABLET(0.1 MG) BY MOUTH TWICE DAILY Patient not taking: Reported on 12/23/2023 07/25/23   Tabori, Katherine E, MD  potassium chloride  SA (KLOR-CON  M) 20 MEQ tablet Take 1 tablet (20 mEq total) by mouth daily. Patient not taking: Reported on 12/23/2023 12/13/23   Zackowski, Scott, MD  prochlorperazine (COMPAZINE) 25 MG suppository Place  25 mg rectally every 12 (twelve) hours as needed for nausea or vomiting. Patient not taking: Reported on 12/23/2023    [provider]     Madelynn Schilder, MD [facility]

## 2023-12-28 NOTE — Progress Notes (Signed)
 PHARMACY - TOTAL PARENTERAL NUTRITION CONSULT NOTE   Indication: Prolonged ileus  Patient Measurements: Height: 5\' 3"  (160 cm) Weight: 49.6 kg (109 lb 5.6 oz) IBW/kg (Calculated) : 52.4 TPN AdjBW (KG): 43.8 Body mass index is 19.37 kg/m. Usual Weight: ~110 lbs  Assessment:  43 yo W with nausea and vomiting for 2 weeks with at least 12 lbs weight loss due to poor PO intake. In March 2025, patient had colon perforation, pericolic abscess, diverticulitis, peritonitis, and septic shock s/p ex lap and right colectomy followed by ileocolic anastomosis and cholecystectomy.  Patient required TPN 3/18- 3/31 due to post-op ileus. CT on 4/29 was suspicious for ileus. Patient had an outpatient EGD 12/23/23 complicated by aspiration and hypotension. Pharmacy consulted for TPN.   Glucose / Insulin : CBG <130,  off SSI Electrolytes:  K 3.9 (received 40 mEq IV), CO2 32 up, CoCa 9.7, Mg 2 (received 4g IV), others wnl Renal: Scr 0.35 (CL 0.4 - 0.6), BUN 10 (from 47) Hepatic: albumin  <1.5, alk phos/AST/ALT/Tbili wnl Intake / Output; MIVF: UOP 0.4 ml/kg/hr (may be incomplete), NGT , LBM 5/13, Net +5.2L Reglan  5/9 >> Enema 5/11, 5/12  GI imaging: 4/29 CT: Mild-to-moderate diffuse small bowel dilatation, without definite transition point or significant interval change, suspicious for ileus. 5/9 KUB: dilated small bowel  5/11 CT: Persistent esophagitis and enterocolitis as well as ileus.  GI procedures: none this admit  Central access: CVC 5/9  TPN start date: 5/10   Nutritional Goals: Goal TPN rate is 65 mL/hr provides 86 g of protein and 1623 kcals per day  RD Assessment: last updated 5/13 Estimated Needs Total Energy Estimated Needs: 1600-1800 kcal Total Protein Estimated Needs: 80-100 gm Total Fluid Estimated Needs: >1.6L  Current Nutrition:  TPN 5/12 Clamp NGT, CLD > FLD- ate most of 1 bowl of grits and half an ice cream 5/13: FLD - ate most of 1 bowl of grits, broth, 1/3 of an ice  cream, cranberry juice, some Boost breeze; was interrupted by PT for lunch, does not like milk-based supplements  Plan:  Increase TPN goal per RD at 65 mL/hr at 1800, goal rate providing 100% of needs  Electrolytes in TPN: increase Na 100 mEq/L, K 70 mEq/L (=109 mEq), Ca 5 mEq/L, Mg 15 mEq/L (Max), decrease Phos 12 mmol/L. increase Cl:Acet max Cl Add standard MVI and trace elements to TPN given high NGT output Monitor TPN labs on Mon/Thurs, and PRN F/u po intake and wean TPN as able  Dorene Gang, PharmD, BCPS, BCCP Clinical Pharmacist  Please check AMION for all [facility] Pharm[facility] phone numbers After 10:00 PM, call Main Pharmacy [phone]

## 2023-12-28 NOTE — Hospital Course (Addendum)
 43 yo female with PMH recent colon perforation s/p septic shock from peritonitis/diverticulitis requiring ex lap with right colectomy and primary ileocolonic anastomosis in March 2025.  Also PMH of chronic idiopathic constipation, chronic intestinal pseudoobstruction, chronic dysmotility, chronic orthostatic hypotension, GERD, anxiety. She had presented to the hospital after attempting to undergo EGD with GI but developed hypotension, hypoxia, and aspiration. She was rather managed by the ICU due to concern for potential septic shock and required Levophed . She improved and was transferred to TRH service on 5/13.   Assessment and Plan:   Septic shock - Secondary to aspiration pneumonia.  Initially on Levophed , subsequently weaned off.  Shock appears resolved 5/13.   Chronic hypotension - Likely contributing to patient's shock above.  midodrine  increased to 10 mg 3 times daily and restarted 0.1 mg twice daily 5/14.  Showing improvement in systolic blood pressure.  No longer dizzy.  Monitor systolic blood pressure.  Weaned midodrine  to 5mg  TID 5/18.  Appears to be tolerating well.   Acute hypoxic respiratory failure - Breathing comfortably on room air.  Appears resolved.    Aspiration pneumonia - Found to have large amount of gastric contents on EGD, subsequent aspiration with CT noting bibasilar opacities.  Continues on Unasyn  (day 7 of 7).  Leukocytosis resolved.  Hypoxia resolved.   Chronic intestinal pseudoobstruction and ileus - History of chronic idiopathic constipation status post right colectomy with ileocolonic anastomosis in March 2025.  GI consulted and following closely.  Reglan  10 mg IV every 8 hours.  DC opioids.  Encourage ambulation.  Showing improvement again this morning.  Tolerated advancement in diet.  NG removed 5/16.  Unlikely patient will be able to meet her calorie requirements fully PO, so arranging for patient to have TPN at discharge.  Patient to have triple-lumen PICC  swapped to double-lumen (per home health request).  Working closely with pharmacy and TOC on TPN and home health.   Iron  deficiency anemia - Status post Venofer  x 2 (5/9, 5/10).  Status post 2 units PRBCs 5/13, 1un 5/14.  Hemoglobin this morning 7.3.  Resolved BRBPR.  Suppository on board.  hemoglobin stable.  Will recheck CBC in AM.  Transfuse if hemoglobin less than 7.   Severe protein calorie malnutrition - Unlikely patient will be able to meet her calorie requirements fully PO, so arranging for patient to have TPN at discharge.  Patient to have triple-lumen PICC swapped to double-lumen (per home health request).  Working closely with pharmacy and TOC on TPN and home health.   GERD - PPI IV on board.

## 2023-12-28 NOTE — Progress Notes (Signed)
 Occupational Therapy Treatment Patient Details Name: Stacy [name] MRN: [id number] DOB: November 23, 1980 Today's Date: 12/28/2023   History of present illness Pt is a 43 y/o female presenting to ED on 5/9 with severe heartburn, underwent EGD and developed n/v, hypoxia, hypotension, CT abd/pelvis with esophagitis. Pt with aspiration PNA leading to acute respiratory failure. 10/2023 wtih colon perforation, pericolic abscess, septic shock 2/2 peritonitis, s/p ex lap and R colectomy, developed postop ilius. Transfer to floor 5/12 overnight. PMH includes orthostatic hypotension, GERD/esophagitis   OT comments  Pt making progress toward goals, needs mod A for LB ADL and CGA for transfers with RW. CGA for bed mobility. Pt needs incr time and x1 seated rest break with short distance mobility in room. Pt mildly symptomatic but BP measures throughout are listed below. Pt presenting with impairments listed below, will follow acutely. Continue to recommend HHOT d/c with family support.  BP supine 104/71 (82) BP seated 102/64 (76) BP standing 84/52 (63) BP seated after short bout ambulation 93/59 (69) BP standing end of session 85/57 (66) BP supine in bed end of session 107/76 (86)       If plan is discharge home, recommend the following:  A little help with walking and/or transfers;A lot of help with bathing/dressing/bathroom;Assistance with cooking/housework;Direct supervision/assist for financial management;Direct supervision/assist for medications management;Assist for transportation;Help with stairs or ramp for entrance   Equipment Recommendations  BSC/3in1    Recommendations for Other Services PT consult    Precautions / Restrictions Precautions Precautions: Fall Recall of Precautions/Restrictions: Intact Precaution/Restrictions Comments: cortrak, history of orthostatic hypotension Restrictions Weight Bearing Restrictions Per Provider Order: No       Mobility Bed Mobility Overal bed  mobility: Needs Assistance Bed Mobility: Supine to Sit, Sit to Supine     Supine to sit: Contact guard Sit to supine: Contact guard assist        Transfers Overall transfer level: Needs assistance Equipment used: Rolling walker (2 wheels) Transfers: Sit to/from Stand Sit to Stand: Contact guard assist                 Balance Overall balance assessment: Needs assistance Sitting-balance support: Feet supported Sitting balance-Leahy Scale: Good     Standing balance support: During functional activity, Bilateral upper extremity supported Standing balance-Leahy Scale: Poor Standing balance comment: reliant on RW in standing                           ADL either performed or assessed with clinical judgement   ADL Overall ADL's : Needs assistance/impaired                     Lower Body Dressing: Moderate assistance;Sitting/lateral leans   Toilet Transfer: Contact guard assist;Ambulation;Rolling walker (2 wheels);Regular Teacher, adult education Details (indicate cue type and reason): simulated via functional mobility         Functional mobility during ADLs: Contact guard assist;Rolling walker (2 wheels)      Extremity/Trunk Assessment Upper Extremity Assessment Upper Extremity Assessment: Generalized weakness   Lower Extremity Assessment Lower Extremity Assessment: Defer to PT evaluation        Vision   Vision Assessment?: No apparent visual deficits   Perception Perception Perception: Not tested   Praxis Praxis Praxis: Not tested   Communication Communication Communication: No apparent difficulties;Other (comment)   Cognition Arousal: Alert Behavior During Therapy: WFL for tasks assessed/performed, Flat affect Cognition: No apparent impairments  Following commands: Intact        Cueing   Cueing Techniques: Verbal cues  Exercises      Shoulder Instructions       General Comments  VSS on 3L, see note for BP measures    Pertinent Vitals/ Pain       Pain Assessment Pain Assessment: No/denies pain  Home Living                                          Prior Functioning/Environment              Frequency  Min 2X/week        Progress Toward Goals  OT Goals(current goals can now be found in the care plan section)  Progress towards OT goals: Progressing toward goals  Acute Rehab OT Goals Patient Stated Goal: none stated OT Goal Formulation: With patient Time For Goal Achievement: 01/09/24 Potential to Achieve Goals: Good ADL Goals Pt Will Perform Grooming: with supervision;sitting Pt Will Perform Upper Body Dressing: with supervision;sitting Pt Will Perform Lower Body Dressing: with supervision;sit to/from stand;sitting/lateral leans Pt Will Transfer to Toilet: with supervision;ambulating;regular height toilet Pt Will Perform Tub/Shower Transfer: Tub transfer;Shower transfer;with supervision;ambulating  Plan      Co-evaluation                 AM-PAC OT "6 Clicks" Daily Activity     Outcome Measure   Help from another person eating meals?: A Little Help from another person taking care of personal grooming?: A Little Help from another person toileting, which includes using toliet, bedpan, or urinal?: A Lot Help from another person bathing (including washing, rinsing, drying)?: A Lot Help from another person to put on and taking off regular upper body clothing?: A Little Help from another person to put on and taking off regular lower body clothing?: A Lot 6 Click Score: 15    End of Session Equipment Utilized During Treatment: Oxygen;Gait belt;Rolling walker (2 wheels) (3L)  OT Visit Diagnosis: Unsteadiness on feet (R26.81);Other abnormalities of gait and mobility (R26.89);Muscle weakness (generalized) (M62.81)   Activity Tolerance Patient tolerated treatment well   Patient Left with call bell/phone within reach;in  bed;with bed alarm set;with family/visitor present   Nurse Communication Mobility status (pt's IV paused/beeping)        Time: 1610-9604 OT Time Calculation (min): 28 min  Charges: OT General Charges $OT Visit: 1 Visit OT Treatments $Self Care/Home Management : 8-22 mins $Therapeutic Activity: 8-22 mins  Stacy [name], OTD, OTR/L SecureChat Preferred Acute Rehab [phone]   Stacy [name] 12/28/2023, 5:07 PM

## 2023-12-28 NOTE — Progress Notes (Signed)
 Progress Note   Patient: Stacy [name] ZOX:[id number] DOB: [birth year] DOA: 12/23/2023     5 DOS: the patient was seen and examined on 12/28/2023   Brief hospital course:  43 yo female with PMH recent colon perforation s/p septic shock from peritonitis/diverticulitis requiring ex lap with right colectomy and primary ileocolonic anastomosis in March 2025.  Also PMH of chronic idiopathic constipation, chronic intestinal pseudoobstruction, chronic dysmotility, chronic orthostatic hypotension, GERD, anxiety. She had presented to the hospital after attempting to undergo EGD with GI but developed hypotension, hypoxia, and aspiration. She was rather managed by the ICU due to concern for potential septic shock and required Levophed . She improved and was transferred to TRH service on 5/13.  Assessment and Plan:  Septic shock - Secondary to aspiration pneumonia.  Initially on Levophed , subsequently weaned off.  Shock appears resolved 5/13.  Chronic hypotension - Likely contributing to patient's shock above.  midodrine  increased to 10 mg 3 times daily.  Systolic blood pressure somewhat low this morning, status post 1 L bolus LR.  Patient otherwise asymptomatic.  Reported history of taking fludrocortisone  however not currently on it.  Will restart 0.1 mg twice daily.  Monitor systolic blood pressure.  Acute hypoxic respiratory failure - Currently on 3 L nasal cannula.  Continue to wean O2 as tolerated.  Aspiration pneumonia - Found to have large amount of gastric contents on EGD, subsequent aspiration with CT noting bibasilar opacities.  Continues on Unasyn.  Leukocytosis improving.  Hypoxia improving.  Chronic intestinal pseudoobstruction and ileus - History of chronic idiopathic constipation status post right colectomy with ileocolonic anastomosis in March 2025.  GI consulted and following closely.  NG tube in place.  Still having significant output, nearly 2 L.  Per recommendations, n.p.o. x 24  hours minus sips of water.  Reglan  10 mg IV every 8 hours.  DC opioids.  Encourage ambulation.  PT following.  Iron  deficiency anemia - Status post Venofer  x 2 (5/9, 5/10).  Status post 2 units PRBCs yesterday.  Hemoglobin this morning 7.0 with noted tachycardia.  Will order 1 more unit PRBCs.  Severe protein calorie malnutrition - Secondary to above.  Currently on TPN.  N.p.o.  GERD - PPI IV on board.    Subjective: Patient resting comfortably this morning, NG tube in place.  Having greater than 1.5 L output overnight.  Patient states she is hungry.  Minimal bowel movement.  Denies any fever, chills, worsening shortness of breath, chest pain, nausea, vomiting, abdominal pain.  Physical Exam: Vitals:   12/28/23 0658 12/28/23 0746 12/28/23 0851 12/28/23 1044  BP: (!) 86/66 (!) 74/50 (!) 86/61 (!) 83/53  Pulse: (!) 114 (!) 120  (!) 117  Resp: (!) 28 (!) 24 (!) 22 (!) 22  Temp: 97.6 F (36.4 C) 98 F (36.7 C)  98 F (36.7 C)  TempSrc: Oral Oral  Oral  SpO2: 96%  96% 98%  Weight:      Height:        GENERAL:  Alert, pleasant, no acute distress, thin HEENT:  EOMI, NG tube CARDIOVASCULAR: Regular and tachycardic, no murmurs appreciated RESPIRATORY:  Clear to auscultation, no wheezing, rales, or rhonchi GASTROINTESTINAL:  Soft, nontender, nondistended EXTREMITIES:  No LE edema bilaterally NEURO:  No new focal deficits appreciated SKIN:  No rashes noted PSYCH:  Appropriate mood and affect     Data Reviewed:  There are no new results to review at this time.  Labs: CBC: Recent Labs  Lab 12/22/23 1048 12/23/23 1441  12/23/23 1447 12/23/23 1449 12/24/23 0226 12/25/23 0444 12/27/23 1058 12/27/23 1318 12/28/23 0800  WBC 12.6*  --  5.8  --  26.2* 16.9* 6.2 6.5 5.6  NEUTROABS 8.3*  --  4.2  --   --   --  4.2  --   --   HGB 8.7*   < > 9.9*   < > 8.4* 7.1* 5.2* 5.4* 7.0*  HCT 27.3*   < > 33.0*   < > 26.4* 22.2* 16.7* 17.7* 21.7*  MCV 86.4  --  92.2  --  86.6 85.7 86.5  87.2 85.1  PLT 745.0*  --  731*  --  651* 471* 357 373 255   < > = values in this interval not displayed.   Basic Metabolic Panel: Recent Labs  Lab 12/24/23 0226 12/25/23 0444 12/26/23 0437 12/26/23 1415 12/27/23 1057 12/28/23 0432  NA 137 141 141  --  136  137 137  K 3.3* 3.2* 3.0* 3.5 3.3*  3.4* 3.9  CL 110 113* 110  --  100  101 101  CO2 15* 21* 24  --  30  30 32  GLUCOSE 129* 146* 145*  --  117*  117* 124*  BUN 47* 23* 12  --  10  10 8   CREATININE 1.03* 0.43* 0.39*  --  <0.30*  0.35* 0.33*  CALCIUM  7.8* 8.1* 7.6*  --  7.7*  7.7* 7.7*  MG 2.1 1.9 1.6*  --  1.7 2.0  PHOS 5.1* 2.5 3.4  --  3.3 3.7   Liver Function Tests: Recent Labs  Lab 12/22/23 1048 12/23/23 1447 12/25/23 0444 12/26/23 0437 12/27/23 1057 12/28/23 0432  AST 14 25  --  11*  --   --   ALT 33 29  --  16  --   --   ALKPHOS 70 73  --  58  --   --   BILITOT 0.5 1.6*  --  0.8  --   --   PROT 6.3 6.1*  --  4.3*  --   --   ALBUMIN  2.9* 2.3* 1.6* <1.5* <1.5* <1.5*   CBG: Recent Labs  Lab 12/26/23 0345 12/26/23 0746 12/26/23 1148 12/26/23 1624 12/26/23 1917  GLUCAP 136* 119* 151* 128* 101*    Scheduled Meds:  (feeding supplement) PROSource Plus  30 mL Oral BID BM   sodium chloride    Intravenous Once   Chlorhexidine  Gluconate Cloth  6 each Topical Daily   feeding supplement  1 Container Oral TID BM   heparin injection (subcutaneous)  5,000 Units Subcutaneous Q8H   metoCLOPramide  (REGLAN ) injection  10 mg Intravenous Q8H   midodrine   10 mg Oral TID WC   pantoprazole  (PROTONIX ) IV  40 mg Intravenous Q12H   polyethylene glycol  17 g Oral BID   simethicone   80 mg Oral QID   sodium chloride  flush  10-40 mL Intracatheter Q12H   Continuous Infusions:  ampicillin -sulbactam (UNASYN) IV 3 g (12/28/23 1138)   TPN ADULT (ION) 60 mL/hr at 12/28/23 0548   TPN ADULT (ION)     PRN Meds:.alum & mag hydroxide-simeth, docusate sodium , ipratropium-albuterol , melatonin, ondansetron  (ZOFRAN ) IV, mouth  rinse, oxyCODONE , phenol, sodium chloride  flush, white petrolatum  Family Communication: Husband and mother at bedside  Disposition: Status is: Inpatient Remains inpatient appropriate because: Ileus and aspiration pneumonia     Time spent: 50 minutes  Author: Jodeane Mulligan, DO 12/28/2023 1:56 PM  For on call review [email or web address].

## 2023-12-29 ENCOUNTER — Inpatient Hospital Stay (HOSPITAL_COMMUNITY)

## 2023-12-29 DIAGNOSIS — R6521 Severe sepsis with septic shock: Secondary | ICD-10-CM | POA: Diagnosis not present

## 2023-12-29 DIAGNOSIS — A419 Sepsis, unspecified organism: Secondary | ICD-10-CM | POA: Diagnosis not present

## 2023-12-29 DIAGNOSIS — J9601 Acute respiratory failure with hypoxia: Secondary | ICD-10-CM | POA: Diagnosis not present

## 2023-12-29 DIAGNOSIS — I951 Orthostatic hypotension: Secondary | ICD-10-CM

## 2023-12-29 DIAGNOSIS — J690 Pneumonitis due to inhalation of food and vomit: Secondary | ICD-10-CM | POA: Diagnosis not present

## 2023-12-29 DIAGNOSIS — K5904 Chronic idiopathic constipation: Secondary | ICD-10-CM | POA: Diagnosis not present

## 2023-12-29 LAB — TYPE AND SCREEN
ABO/RH(D): O POS
Antibody Screen: NEGATIVE
Unit division: 0
Unit division: 0
Unit division: 0

## 2023-12-29 LAB — COMPREHENSIVE METABOLIC PANEL WITH GFR
ALT: 27 U/L (ref 0–44)
AST: 26 U/L (ref 15–41)
Albumin: <1.5 g/dL — ABNORMAL LOW (ref 3.5–5.0)
Alkaline Phosphatase: 60 U/L (ref 38–126)
Anion gap: 7 (ref 5–15)
BUN: 10 mg/dL (ref 6–20)
CO2: 31 mmol/L (ref 22–32)
Calcium: 7.6 mg/dL — ABNORMAL LOW (ref 8.9–10.3)
Chloride: 100 mmol/L (ref 98–111)
Creatinine, Ser: 0.32 mg/dL — ABNORMAL LOW (ref 0.44–1.00)
GFR, Estimated: >60 mL/min (ref >60)
Glucose, Bld: 109 mg/dL — ABNORMAL HIGH (ref 70–99)
Potassium: 3.2 mmol/L — ABNORMAL LOW (ref 3.5–5.1)
Sodium: 138 mmol/L (ref 135–145)
Total Bilirubin: 0.6 mg/dL (ref 0.0–1.2)
Total Protein: 4.4 g/dL — ABNORMAL LOW (ref 6.5–8.1)

## 2023-12-29 LAB — CBC
HCT: 24.4 % — ABNORMAL LOW (ref 36.0–46.0)
HCT: 25.7 % — ABNORMAL LOW (ref 36.0–46.0)
Hemoglobin: 7.7 g/dL — ABNORMAL LOW (ref 12.0–15.0)
Hemoglobin: 8.2 g/dL — ABNORMAL LOW (ref 12.0–15.0)
MCH: 27.2 pg (ref 26.0–34.0)
MCH: 27.8 pg (ref 26.0–34.0)
MCHC: 31.6 g/dL (ref 30.0–36.0)
MCHC: 31.9 g/dL (ref 30.0–36.0)
MCV: 86.2 fL (ref 80.0–100.0)
MCV: 87.1 fL (ref 80.0–100.0)
Platelets: 269 10*3/uL (ref 150–400)
Platelets: 238 10*3/uL (ref 150–400)
RBC: 2.83 MIL/uL — ABNORMAL LOW (ref 3.87–5.11)
RBC: 2.95 MIL/uL — ABNORMAL LOW (ref 3.87–5.11)
RDW: 15.7 % — ABNORMAL HIGH (ref 11.5–15.5)
RDW: 15.9 % — ABNORMAL HIGH (ref 11.5–15.5)
WBC: 5.7 10*3/uL (ref 4.0–10.5)
WBC: 5.7 10*3/uL (ref 4.0–10.5)
nRBC: 0.4 % — ABNORMAL HIGH (ref 0.0–0.2)
nRBC: 0.4 % — ABNORMAL HIGH (ref 0.0–0.2)

## 2023-12-29 LAB — BPAM RBC
Blood Product Expiration Date: 202506092359
Blood Product Expiration Date: 202506102359
Blood Product Expiration Date: 202506092359
ISSUE DATE / TIME: 202505141346
ISSUE DATE / TIME: 202505131816
ISSUE DATE / TIME: 202505140020
Unit Type and Rh: 5100
Unit Type and Rh: 202506092359
Unit Type and Rh: 5100
Unit Type and Rh: 5100

## 2023-12-29 LAB — MAGNESIUM: Magnesium: 1.5 mg/dL — ABNORMAL LOW (ref 1.7–2.4)

## 2023-12-29 LAB — PROCALCITONIN: Procalcitonin: 1.16 ng/mL

## 2023-12-29 LAB — PHOSPHORUS: Phosphorus: 3.3 mg/dL (ref 2.5–4.6)

## 2023-12-29 MED ORDER — NYSTATIN 100000 UNIT/ML MT SUSP
5.0000 mL | Freq: Four times a day (QID) | OROMUCOSAL | Status: DC
  Administered 2023-12-29 – 2024-01-02 (×18): 500000 [IU] via ORAL
  Filled 2023-12-29 (×20): qty 5

## 2023-12-29 MED ORDER — ACETAMINOPHEN 325 MG PO TABS
650.0000 mg | ORAL_TABLET | Freq: Four times a day (QID) | ORAL | Status: DC | PRN
  Administered 2023-12-29: 650 mg via ORAL
  Filled 2023-12-29: qty 2

## 2023-12-29 MED ORDER — MAGNESIUM SULFATE 4 GM/100ML IV SOLN
4.0000 g | Freq: Once | INTRAVENOUS | Status: AC
  Administered 2023-12-29: 4 g via INTRAVENOUS
  Filled 2023-12-29: qty 100

## 2023-12-29 MED ORDER — HYDROCORTISONE ACETATE 25 MG RE SUPP
25.0000 mg | Freq: Two times a day (BID) | RECTAL | Status: DC
  Administered 2023-12-29 – 2024-01-03 (×11): 25 mg via RECTAL
  Filled 2023-12-29 (×11): qty 1

## 2023-12-29 MED ORDER — POTASSIUM CHLORIDE 10 MEQ/100ML IV SOLN
10.0000 meq | INTRAVENOUS | Status: AC
  Administered 2023-12-29 (×5): 10 meq via INTRAVENOUS
  Filled 2023-12-29 (×5): qty 100

## 2023-12-29 MED ORDER — TRAVASOL 10 % IV SOLN
INTRAVENOUS | Status: AC
  Filled 2023-12-29: qty 858

## 2023-12-29 NOTE — Progress Notes (Signed)
 Physical Therapy Treatment Patient Details Name: Stacy [name] MRN: [id number] DOB: [birth year] Today's Date: 12/29/2023   History of Present Illness Pt is a 43 y/o female presenting to ED on 5/9 with severe heartburn, underwent EGD and developed n/v, hypoxia, hypotension, CT abd/pelvis with esophagitis. Pt with aspiration PNA leading to acute respiratory failure. 10/2023 wtih colon perforation, pericolic abscess, septic shock 2/2 peritonitis, s/p ex lap and R colectomy, developed postop ileus. Transfer to floor 5/12 overnight. Pt with + BRBPR 5/15. PMH includes Adrenal insufficiency with chronic orthostatic hypotension, GERD/esophagitis.    PT Comments  Pt received seated on BSC, requesting assist back to bed. PTA attempted to notify RN and NT and charge RN but no one available at time of request to assist pt with hygiene/return to supine so PTA called back to room to help for pt safety. Pt with noted BRBPR (slightly darker per spouse than in AM) and significant size BM, pt needing totalA for peri-care in standing and up to minA for stability with stand>sit transfer. Pt continues to benefit from PT services to progress toward functional mobility goals.     If plan is discharge home, recommend the following: A little help with walking and/or transfers;A little help with bathing/dressing/bathroom;Assistance with cooking/housework;Assist for transportation;Help with stairs or ramp for entrance   Can travel by private vehicle        Equipment Recommendations  None recommended by PT;Other (comment) (TBD, may need supplemental O2 pending progress)    Recommendations for Other Services       Precautions / Restrictions Precautions Precautions: Fall Recall of Precautions/Restrictions: Intact Precaution/Restrictions Comments: NGT (off suction), history of orthostatic hypotension, + blood in stool Restrictions Weight Bearing Restrictions Per Provider Order: No     Mobility  Bed  Mobility Overal bed mobility: Needs Assistance Bed Mobility: Sit to Supine, Rolling Rolling: Supervision, Used rails   Supine to sit: Contact guard, Min assist, HOB elevated Sit to supine: Contact guard assist, HOB elevated   General bed mobility comments: HOB > 30 degrees due to NGT; pt spouse shown how to place her bed in chair posture for mealtimes once she is allowed to eat/drink.    Transfers Overall transfer level: Needs assistance Equipment used: Rolling walker (2 wheels) Transfers: Sit to/from Stand Sit to Stand: Contact guard assist, Min assist           General transfer comment: CGA to stand from [facility], cues for UE placement, minA for stand>sit to EOB    Ambulation/Gait Ambulation/Gait assistance: Contact guard assist, +2 safety/equipment Gait Distance (Feet): 5 Feet Assistive device: Rolling walker (2 wheels) Gait Pattern/deviations: Step-to pattern, Shuffle, Trunk flexed       General Gait Details: pt fatigued, only ~54ft pivoting from BSC to EOB second session, she already walked in hallway   Stairs             Wheelchair Mobility     Tilt Bed    Modified Rankin (Stroke Patients Only)       Balance Overall balance assessment: Needs assistance Sitting-balance support: Feet supported Sitting balance-Leahy Scale: Good     Standing balance support: During functional activity, Bilateral upper extremity supported Standing balance-Leahy Scale: Poor Standing balance comment: reliant on RW in standing                            Communication Communication Communication: No apparent difficulties;Other (comment) Factors Affecting Communication:  (pt tends not to report symptoms  unless prompted)  Cognition Arousal: Alert Behavior During Therapy: WFL for tasks assessed/performed, Flat affect   PT - Cognitive impairments: Safety/Judgement                       PT - Cognition Comments: Spouse present and supportive; pt needs  reminders to push from bed/reach back for bed or BSC handles with transfers, tends to maintain BUE on RW handles when not cued. Following commands: Intact      Cueing Cueing Techniques: Verbal cues  Exercises      General Comments General comments (skin integrity, edema, etc.): BP 97/72 (81) sitting on BSC prior to step pivot back to bed, but after initial gait trial in hallway      Pertinent Vitals/Pain Pain Assessment Pain Assessment: No/denies pain Pain Intervention(s): Monitored during session, Limited activity within patient's tolerance, Repositioned    Home Living                          Prior Function            PT Goals (current goals can now be found in the care plan section) Acute Rehab PT Goals Patient Stated Goal: return home PT Goal Formulation: With patient/family Time For Goal Achievement: 01/09/24 Progress towards PT goals: Progressing toward goals    Frequency    Min 2X/week      PT Plan      Co-evaluation              AM-PAC PT "6 Clicks" Mobility   Outcome Measure  Help needed turning from your back to your side while in a flat bed without using bedrails?: A Little Help needed moving from lying on your back to sitting on the side of a flat bed without using bedrails?: A Little Help needed moving to and from a bed to a chair (including a wheelchair)?: A Little Help needed standing up from a chair using your arms (e.g., wheelchair or bedside chair)?: A Little Help needed to walk in hospital room?: A Little Help needed climbing 3-5 steps with a railing? : Total 6 Click Score: 16    End of Session Equipment Utilized During Treatment: Oxygen Activity Tolerance: Patient tolerated treatment well Patient left: with call bell/phone within reach;with family/visitor present;in bed (HOB ~45 deg, spouse in room) Nurse Communication: Mobility status;Other (comment) (pt needs smaller size compression socks; blood in stool (in bathroom  BSC)) PT Visit Diagnosis: Other abnormalities of gait and mobility (R26.89);Muscle weakness (generalized) (M62.81);Unsteadiness on feet (R26.81);Difficulty in walking, not elsewhere classified (R26.2)     Time: 6578-4696 PT Time Calculation (min) (ACUTE ONLY): 10 min  Charges:   $Therapeutic Activity: 8-22 mins PT General Charges $$ ACUTE PT VISIT: 1 Visit                     Elizbeth Posa P., PTA Acute Rehabilitation Services Secure Chat Preferred 9a-5:30pm Office: [phone]    Mariel Shope [facility] 12/29/2023, 2:31 PM

## 2023-12-29 NOTE — Plan of Care (Signed)

## 2023-12-29 NOTE — Progress Notes (Signed)
 [facility] Gastroenterology Progress Note  Rollie Trefry 43 y.o. Jan 29, 1981  CC: Nausea, vomiting, poor oral intake, pneumonia   Subjective: Patient seen and examined at bedside.  Feeling better today.  Had 3 bowel movements since yesterday.  No significant output and NG suction this morning.  ROS : Negative for bleeding.   Objective: Vital signs in last 24 hours: Vitals:   12/29/23 0447 12/29/23 0804  BP: (!) 128/96 117/86  Pulse: (!) 103 100  Resp: 18   Temp: 98 F (36.7 C)   SpO2: 95% 96%    Physical Exam:  Resting in the bed, not in acute distress.  NG tube in place.  Abdomen remains distended with hypoactive bowel sounds.  No peritoneal signs.  Mood and affect normal but somewhat concerned.  Alert and oriented x 3.  Lab Results: Recent Labs    12/28/23 0432 12/29/23 0412  NA 137 138  K 3.9 3.2*  CL 101 100  CO2 32 31  GLUCOSE 124* 109*  BUN 8 10  CREATININE 0.33* 0.32*  CALCIUM  7.7* 7.6*  MG 2.0 1.5*  PHOS 3.7 3.3   Recent Labs    12/28/23 0432 12/29/23 0412  AST  --  26  ALT  --  27  ALKPHOS  --  60  BILITOT  --  0.6  PROT  --  4.4*  ALBUMIN  <1.5* <1.5*   Recent Labs    12/27/23 1058 12/27/23 1318 12/28/23 0800 12/28/23 2006 12/29/23 0412  WBC 6.2   < > 5.6  --  5.7  NEUTROABS 4.2  --   --   --   --   HGB 5.2*   < > 7.0* 8.0* 7.7*  HCT 16.7*   < > 21.7* 25.0* 24.4*  MCV 86.5   < > 85.1  --  86.2  PLT 357   < > 255  --  269   < > = values in this interval not displayed.   No results for input(s): "LABPROT", "INR" in the last 72 hours.     Assessment/Plan: -Aspiration pneumonia with acute hypoxic respiratory failure. 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  Now on TPN. -Anemia.  Hemoglobin 7.0     Recommendations --------------------------- - Patient had 3 bowel movements since yesterday.  No significant output in NG this morning. -Trial of clear liquids.  If worsening symptoms, consider abdominal x-ray. - Continue Reglan  10 mg IV every 8 hours.  She is allergic to erythromycin. - Stop all narcotics - Ambulate with assistant to help with GI motility. - Patient likely has significant motility disorder affecting entire GI system resulting in retained food in esophagus, retained food in the stomach as well as chronic constipation. -Continue other supportive care  - Discussed with family at bedside  -GI will follow     Felecia Hopper MD, FACP 12/29/2023, 8:07 AM  Contact #  [phone]

## 2023-12-29 NOTE — Progress Notes (Signed)
 TRH night cross cover note:   I was notified by the patient's RN of the patient's request for prn acetaminophen  for mild abdominal discomfort.  Per pt's request, I subsequently added prn acetaminophen  for discomfort.     Camelia Cavalier, DO Hospitalist

## 2023-12-29 NOTE — Progress Notes (Signed)
 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 She was sent to ED for eval. Currently, she is on NRM 15 L, SpO2 94%, having chills, SOB, tachycardic, and tachypneic. She has N/V for 2 weeks and underwent eval in ED one 10 days ago with CT abd/pelvis:  Increased diffuse wall thickening of visualized distal thoracic esophagus, consistent with esophagitis. Mild-to-moderate diffuse small bowel dilatation, without definite transition point or significant interval change, suspicious for ileus. Mild improvement in diffuse small bowel wall thickening, suggesting improving enteritis. No evidence of abscess.   Her last BM was 4 days ago, soft. Denied f/c/r before EGD. Denied abd pain or worsening distension. Compliant with meds. No alcohol drinking, smoking, or illicit drug use. She gained after discharge but in the last 2 weeks, she has poor intake.    In October 29, 2023, she has colon perforation, pericolic abscess, diverticulitis, septic shock due to peritonitis, s/p ex lap and right colectomy, followed by ex lap with primary ileocolonic anastomosis and cholecystectomy. She developed post-operative Ileus, and she was placed on TPN for almost 10 days  before resuming oral intake.   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 5/10: On Levophed  3 mcg/min, RL 125 cc/hr, positive 1.8 fluid balance, feels better 5/11: N/V yesterday evening after eating and NGT had to be re inserted, NGT output in 50 cc only. Good UOP. Positive 1.3 L fluid balance in the last 24 hrs. Off Levophed . On TPN. NPO. On 12 L [place]. No BM or passing gas  5/12: has BM today and she wants to eat. NGT 440 cc in the last 24 hrs. On [place] 5 L. On TPN 5/13: She has abd distension and burping leading to vomiting four times and hospitalist order NGT. On TPN. 3 L [place] 5/14: dizziness when wants to stand or walk. NGT output 1800+1900 cc,  has abd stable distension and burping leading to vomiting, On TPN. 3 L [place]. Passing small BM 5/15: dizziness has resolved on higher dose of Midodrine . She wants to shower and walk which are encouraged. NGT output is 2200 cc. Less abd stable distension. Had 3 BM. On TPN. 3 L . Feels better Interim History / Subjective:    Objective    Blood pressure 117/86, pulse 100, temperature 98 F (36.7 C), temperature source Oral, resp. rate 18, height 5\' 3"  (1.6 m), weight 52.6 kg, last menstrual period 10/29/2023, SpO2  96%.        Intake/Output Summary (Last 24 hours) at 12/29/2023 0922 Last data filed at 12/29/2023 0600 Gross per 24 hour  Intake 2218.64 ml  Output 1200 ml  Net 1018.64 ml   Filed Weights   12/28/23 0500 12/28/23 2008 12/29/23 0448  Weight: 49.6 kg 53.6 kg 52.6 kg    Examination: General: alert,  oriented x4, and mild resp distress. [place] 3 L, SpO2 98%  HENT: PERL, oral thrush. No LNE or thyromegaly. No JVD Lungs: symmetrical air entry bilaterally. No crackles or wheezing Cardiovascular: NL S1/S2. No m/g/r Abdomen: less distension, gaseous. No tenderness. +BS  Extremities: no edema. Symmetrical  Neuro: nonfocal   PICC line   Resolved Hospital Problem list     Assessment & Plan:  Aspiration PNA leading to acute hypoxic resp failure Septic shock vs chronic orthostatic hypotension (on Florinef  & Midodrine ) -Unasyn, day#7 out of 10 days -MRSA screening: negative -Sputum Cx: NA -Bcx2: no growth  -PCT: high, continue Abx for 10 days total -Duoneb  -I/S -CVC/a-line (right femoral area): d/c 12/25/2023, PICC was inserted -Midodrine   -Encourage ambulation, d/w family  -PCXR: better   -12/24/2023, Echo: EF 60%    Oral thrush Nystatin for 14 days   GERD/esophagitis (on PPI/H2B) -Continue PPI bid -Reglan , allergic to erythromycin  -Miralax   -s/p Fleet Enema x2 per GI in the last few days -Gas-X   Fe def anemia Moderate PCM Vit B12 and folate: NL -IV Fe, s/p two doses  -Glycemic control -On TPN -Serial labs   Chronic idiopathic constipation/chronic intestinal pseudoobstruction  -Consult GI surgery: called but they had nothing extra to offer for now -GI on board: appreciated and noted their recommendations  -I/O chart -NGT  PCCM will sign off. Please call us  back if we can be of any further assistance.   Best Practice (right click and "Reselect all SmartList Selections" daily)   Diet/type: TPN, diet as tolerated  DVT prophylaxis prophylactic heparin  Pressure ulcer(s): N/A GI prophylaxis: PPI Lines: PICC line  Foley:  N/A Code Status:  full code Last date of multidisciplinary goals of care discussion []   Labs   CBC: Recent Labs  Lab 12/22/23 1048 12/23/23 1441 12/23/23 1447 12/23/23 1449 12/25/23 0444 12/27/23 1058 12/27/23 1318 12/28/23 0800  12/28/23 2006 12/29/23 0412  WBC 12.6*  --  5.8   < > 16.9* 6.2 6.5 5.6  --  5.7  NEUTROABS 8.3*  --  4.2  --   --  4.2  --   --   --   --   HGB 8.7*   < > 9.9*   < > 7.1* 5.2* 5.4* 7.0* 8.0* 7.7*  HCT 27.3*   < > 33.0*   < > 22.2* 16.7* 17.7* 21.7* 25.0* 24.4*  MCV 86.4  --  92.2   < > 85.7 86.5 87.2 85.1  --  86.2  PLT 745.0*  --  731*   < > 471* 357 373 255  --  269   < > = values in this interval not displayed.    Basic Metabolic Panel: Recent Labs  Lab 12/25/23 0444 12/26/23 0437 12/26/23 1415 12/27/23 1057 12/28/23 0432 12/29/23 0412  NA 141 141  --  136  137 137 138  K 3.2* 3.0* 3.5 3.3*  3.4* 3.9 3.2*  CL 113* 110  --  100  101 101 100  CO2 21* 24  --  30  30 32 31  GLUCOSE 146* 145*  --  117*  117* 124* 109*  BUN 23* 12  --  10  10 8 10   CREATININE 0.43* 0.39*  --  <0.30*  0.35* 0.33* 0.32*  CALCIUM  8.1* 7.6*  --  7.7*  7.7* 7.7* 7.6*  MG 1.9 1.6*  --  1.7 2.0 1.5*  PHOS 2.5 3.4  --  3.3 3.7 3.3   GFR: Estimated Creatinine Clearance: 75 mL/min (A) (by C-G formula based on SCr of 0.32 mg/dL (L)). Recent Labs  Lab 12/23/23 1442 12/23/23 1447 12/24/23 0226 12/27/23 1058 12/27/23 1318 12/28/23 0800 12/29/23 0412  PROCALCITON  --  0.50  --   --   --   --  1.16  WBC  --  5.8   < > 6.2 6.5 5.6 5.7  LATICACIDVEN 1.5  --   --   --   --   --   --    < > = values in this interval not displayed.    Liver Function Tests: Recent Labs  Lab 12/22/23 1048 12/23/23 1447 12/25/23 0444 12/26/23 0437 12/27/23 1057 12/28/23 0432 12/29/23 0412  AST 14 25  --  11*  --   --  26  ALT 33 29  --  16  --   --  27  ALKPHOS 70 73  --  58  --   --  60  BILITOT 0.5 1.6*  --  0.8  --   --  0.6  PROT 6.3 6.1*  --  4.3*  --   --  4.4*  ALBUMIN  2.9* 2.3* 1.6* <1.5* <1.5* <1.5* <1.5*   No results for input(s): "LIPASE", "AMYLASE" in the last 168 hours. No results for input(s): "AMMONIA" in the last 168 hours.  ABG    Component Value Date/Time   PHART 7.35 10/29/2023  1828   PCO2ART 45 10/29/2023 1828   PO2ART 275 (H) 10/29/2023 1828   HCO3 15.1 (L) 12/23/2023 1449   TCO2 16 (L) 12/23/2023 1449   ACIDBASEDEF 9.0 (H) 12/23/2023 1449   O2SAT 58 12/23/2023 1449     Coagulation Profile: Recent Labs  Lab 12/23/23 1447  INR 1.5*    Cardiac Enzymes: No results for input(s): "CKTOTAL", "CKMB", "CKMBINDEX", "TROPONINI" in the last 168 hours.  HbA1C: Hgb A1c MFr Bld  Date/Time Value Ref Range Status  07/28/2013 04:30 AM 5.1 <5.7 % Final    Comment:    (NOTE)                                                                       According to the ADA Clinical Practice Recommendations for 2011, when HbA1c is used as a screening test:  >=6.5%   Diagnostic of Diabetes Mellitus           (if abnormal result is confirmed) 5.7-6.4%   Increased risk of developing Diabetes Mellitus References:Diagnosis and Classification of Diabetes Mellitus,Diabetes Care,2011,34(Suppl 1):S62-S69 and Standards of Medical Care in         Diabetes - 2011,Diabetes Care,2011,34 (Suppl 1):S11-S61.    CBG: Recent Labs  Lab 12/26/23 0345 12/26/23 0746 12/26/23 1148 12/26/23 1624 12/26/23 1917  GLUCAP 136* 119* 151* 128* 101*    Review of Systems:   Review of Systems  Constitutional:  Positive for malaise/fatigue and  weight loss. Negative for diaphoresis and fever.  HENT:  Positive for sore throat. Negative for congestion, nosebleeds and sinus pain.   Respiratory:  Positive for cough, sputum production and shortness of breath. Negative for hemoptysis, wheezing and stridor.   Cardiovascular:  Negative for chest pain, palpitations, orthopnea, claudication, leg swelling and PND.  Gastrointestinal:  Positive for heartburn. Negative for blood in stool, diarrhea and melena.  Genitourinary:  Negative for dysuria, frequency and urgency.  Musculoskeletal:  Negative for myalgias.  Skin:  Negative for itching and rash.   Past Medical History:  She,  has a past medical history  of Anxiety, Benign liver cyst, Chronic intestinal pseudo-obstruction, GERD (gastroesophageal reflux disease), History of UTI, and Nausea and vomiting (04/28/2020).   Surgical History:   Past Surgical History:  Procedure Laterality Date   BIOPSY  05/20/2020   Procedure: BIOPSY;  Surgeon: Celedonio Coil, MD;  Location: [facility];  Service: Endoscopy;;   CESAREAN SECTION     twice 2010, 2013   CHOLECYSTECTOMY     ESOPHAGOGASTRODUODENOSCOPY (EGD) WITH PROPOFOL  N/A 05/20/2020   Procedure: ESOPHAGOGASTRODUODENOSCOPY (EGD) WITH PROPOFOL ;  Surgeon: Celedonio Coil, MD;  Location: [facility];  Service: Endoscopy;  Laterality: N/A;   [place]ure  ~2015   LAPAROTOMY N/A 10/29/2023   Procedure: LAPAROTOMY, EXPLORATORY; RIGHT EXTENDED COLECTOMY;  Surgeon: Adalberto Acton, MD;  Location: [facility];  Service: General;  Laterality: N/A;   LAPAROTOMY N/A 10/31/2023   Procedure: RE-EXPLORATION OF ABDOMEN, CHOLECYSTECTOMY, ILEOCOLONIC ANASTOMOSIS;  Surgeon: Aldean Hummingbird, MD;  Location: [facility];  Service: General;  Laterality: N/A;     Social History:   reports that she has never smoked. She has never used smokeless tobacco. She reports that she does not drink alcohol and does not use drugs.   Family History:  Her family history includes CAD in her father; Diabetes in her brother and father; Hypertension in her father. There is no history of Colon cancer, Breast cancer, or Adrenal disorder.   Allergies Allergies  Allergen Reactions   Tetracyclines & Related Swelling    Swelling in spine; required spinal tap.    Erythromycin Other (See Comments)    Swelling in spine; required spinal tap   Raspberry Rash   Sulfa Antibiotics Rash    hives   Sulfonamide Derivatives Rash    Reaction to cream     Home Medications  Prior to Admission medications   Medication Sig Start Date End Date Taking? Authorizing Provider  acetaminophen  (TYLENOL ) 500 MG tablet Take 2 tablets (1,000 mg total) by mouth every 6 (six)  hours as needed for mild pain (pain score 1-3), fever or headache. 11/15/23  Yes Elwin Hammond, PA-C  cyanocobalamin  (,VITAMIN B-12,) 1000 MCG/ML injection Inject 1,000 mcg into the muscle every 30 (thirty) days.   Yes [provider]  megestrol  (MEGACE  ES) 625 MG/5ML suspension SHAKE LIQUID AND TAKE 5 ML(625 MG) BY MOUTH DAILY 10/24/23  Yes Tabori, Katherine E, MD  midodrine  (PROAMATINE ) 5 MG tablet Take 1 tablet (5 mg total) by mouth 3 (three) times daily with meals. 03/17/23  Yes Maudine Sos, MD  montelukast  (SINGULAIR ) 10 MG tablet Take 1 tablet (10 mg total) by mouth daily. 06/01/23  Yes Tabori, Katherine E, MD  Multiple Vitamin (MULTIVITAMIN WITH MINERALS) TABS tablet Take 1 tablet by mouth daily. Patient taking differently: Take 1 tablet by mouth every evening. 05/29/20  Yes Armenta Landau, MD  ondansetron  (ZOFRAN -ODT) 4 MG disintegrating tablet Take 1 tablet (4 mg total) by  mouth every 8 (eight) hours as needed for nausea or vomiting. 12/08/23  Yes Jess Morita, MD  pantoprazole  (PROTONIX ) 40 MG tablet Take 1 tablet (40 mg total) by mouth daily. Patient taking differently: Take 40 mg by mouth 2 (two) times daily. 12/13/23  Yes Zackowski, Scott, MD  polyethylene glycol (MIRALAX  / GLYCOLAX ) 17 g packet Take 17 g by mouth 3 (three) times daily. Patient taking differently: Take 17 g by mouth 3 (three) times daily as needed for moderate constipation. 05/28/20  Yes Armenta Landau, MD  traZODone  (DESYREL ) 50 MG tablet Take 0.5-1 tablets (25-50 mg total) by mouth at bedtime as needed for sleep. Patient taking differently: Take 50 mg by mouth at bedtime as needed for sleep. 12/08/23  Yes Tabori, Katherine E, MD  Vitamin D , Ergocalciferol , (DRISDOL ) 1.25 MG (50000 UNIT) CAPS capsule Take 50,000 Units by mouth once a week. 11/02/23  Yes [provider]  famotidine  (PEPCID ) 40 MG/5ML suspension Take 5 mLs (40 mg total) by mouth daily. Patient not taking: Reported on  12/23/2023 12/12/23   Tabori, Katherine E, MD  fludrocortisone  (FLORINEF ) 0.1 MG tablet TAKE 1 TABLET(0.1 MG) BY MOUTH TWICE DAILY Patient not taking: Reported on 12/23/2023 07/25/23   Tabori, Katherine E, MD  potassium chloride  SA (KLOR-CON  M) 20 MEQ tablet Take 1 tablet (20 mEq total) by mouth daily. Patient not taking: Reported on 12/23/2023 12/13/23   Zackowski, Scott, MD  prochlorperazine (COMPAZINE) 25 MG suppository Place 25 mg rectally every 12 (twelve) hours as needed for nausea or vomiting. Patient not taking: Reported on 12/23/2023    [provider]     Madelynn Schilder, MD [facility]

## 2023-12-29 NOTE — Progress Notes (Signed)
 Nutrition Follow-up  DOCUMENTATION CODES:   Underweight, Severe malnutrition in context of chronic illness  INTERVENTION:   Continue TPN per pharmacy  TPN per pharmacy to meet 1600-1800 kcal and 80-100 grams protein Recommend continuing TPN until Pt advanced to at least a soft diet and completing >50% of her meals or taking in most of her ONS on FLD Boost Breeze po TID, each supplement provides 250 kcal and 9 grams of protein Prosource Plus BID, each packet contains 100 kcal and 15 gm protein MVI with minerals daily - Pharmacy adding to TPN  When advanced past FLD recommend adding back in Magic cups    Monitor tolerance of po intake. Even if pt able to tolerate some po, pt will likely still benefit from nutrition support (TPN vs EN) to improve nutrition and overall status   NUTRITION DIAGNOSIS:   Severe Malnutrition related to chronic illness, altered GI function as evidenced by severe muscle depletion, severe fat depletion, per patient/family report.  - Still applicable   GOAL:   Patient will meet greater than or equal to 90% of their needs  - Meeting via TPN   MONITOR:   PO intake, Supplement acceptance, Diet advancement, I & O's, Labs, Other (Comment) (TPN tolerance)  REASON FOR ASSESSMENT:   Consult New TPN/TNA  ASSESSMENT:   43 yo female admitted with N/V with wt loss and FTT with hx of chronic idiopathic constipation/chronic intestinal pseudo obstruction. Pt with hospitalization in March 2025 with colon perforation with peritonitis requiring R. Colectomy followed by Primary Ileocolonic anastomosis and cholecystectomy. Pt required TPN during this hospital stay. Pt with hx of GERD, Iron  def anemia, moderate malnutrition  5/09 Admitted, KUB: dilated small bowel  5/10 TPN initiation 5/11 CT: Persistent esophagitis and enterocolitis as well as ileus.  5/12 - FLD, NGT clamped.   5/13 - NGT removed, Vomiting x 4, and abdominal distension-NGT replaced. Per GI: Likely has  significant motility disorder affecting entire GI system resulting in retained food in esophagus, retained food in the stomach as well as chronic constipation.  5/14 - NPO, due to worsening abdominal distension 5/15 - CLD  Pt eating lunch on visit, husband at bedside. Was NPO x 24 hours due to abdominal distension and vomiting requiring NGT to be replaced, 1.4L output x 24 hours. Per MD minimal output today. Pt reports having an appetite. Likes the [employer] and Prosource Plus but was not drinking them due to concerns for her raspberry allergy. RD confirmed these supplements do not have raspberry in them. Encouraged intake of supplements to support PO intake. Tolerating some clear liquids, no nausea, vomiting or abdominal pain noted.Husband did reports she had x3 BM since yesterday that had some bright red blood in them. Nursing aware.  TPN per pharmacy: Goal TPN rate is 65 mL/hr provides 86 g of protein and 1623 kcals per day  Mag and Phos low, pharmacy supplementing.   Admit weight: 43.8 kg Current weight: 52.6 kg   Average Meal Intake: 5/12: 50% intake x 1 recorded meals   Intake/Output Summary (Last 24 hours) at 12/29/2023 1611 Last data filed at 12/29/2023 1542 Gross per 24 hour  Intake 3404.44 ml  Output 300 ml  Net 3104.44 ml   Drains/Lines: NGT: 1.4 L x 24 hours Emesis: 300 ml x 24 hours  No OP charted  Nutritionally Relevant Medications: Scheduled Meds:  (feeding supplement) PROSource Plus  30 mL Oral BID BM   Chlorhexidine  Gluconate Cloth  6 each Topical Daily   feeding  supplement  1 Container Oral TID BM   fludrocortisone   0.1 mg Per Tube BID   heparin injection (subcutaneous)  5,000 Units Subcutaneous Q8H   hydrocortisone   25 mg Rectal BID   metoCLOPramide  (REGLAN ) injection  10 mg Intravenous Q8H   midodrine   10 mg Oral TID WC   nystatin  5 mL Oral QID   pantoprazole  (PROTONIX ) IV  40 mg Intravenous Q12H   polyethylene glycol  17 g Oral BID   simethicone   80 mg  Oral QID   sodium chloride  flush  10-40 mL Intracatheter Q12H   Continuous Infusions:  ampicillin -sulbactam (UNASYN) IV Stopped (12/29/23 1115)   TPN ADULT (ION) 65 mL/hr at 12/29/23 1542   TPN ADULT (ION)      Labs Reviewed: Potassium 3.2 Creatinine 0.32 Calcium  7.6 Magnesium  1.5 Albumin  <1.5  Total protein 4.4 No recent CBG No recent A1C  Diet Order:   Diet Order             Diet clear liquid Room service appropriate? Yes; Fluid consistency: Thin  Diet effective now                   EDUCATION NEEDS:   Not appropriate for education at this time  Skin:  Skin Assessment: Reviewed RN Assessment  Last BM:  5/15 x 4 type 4  Height:   Ht Readings from Last 1 Encounters:  12/23/23 5\' 3"  (1.6 m)    Weight:   Wt Readings from Last 1 Encounters:  12/29/23 52.6 kg    Ideal Body Weight:  52.3 kg  BMI:  Body mass index is 20.54 kg/m.  Estimated Nutritional Needs:   Kcal:  1600-1800 kcal  Protein:  80-100 gm  Fluid:  >1.6L   Frederik Jansky, RD Registered Dietitian  See Amion for more information

## 2023-12-29 NOTE — Progress Notes (Signed)
 Physical Therapy Treatment Patient Details Name: Stacy [name] MRN: [id number] DOB: [birth year] Today's Date: 12/29/2023   History of Present Illness Pt is a 43 y/o female presenting to ED on 5/9 with severe heartburn, underwent EGD and developed n/v, hypoxia, hypotension, CT abd/pelvis with esophagitis. Pt with aspiration PNA leading to acute respiratory failure. 10/2023 wtih colon perforation, pericolic abscess, septic shock 2/2 peritonitis, s/p ex lap and R colectomy, developed postop ileus. Transfer to floor 5/12 overnight. PMH includes Adrenal insufficiency with chronic orthostatic hypotension, GERD/esophagitis.    PT Comments  Pt received in supine, agreeable to therapy session with emphasis on transfer and gait training. Compression socks donned supine prior to OOB but somewhat loose so RN/secretary notified she will need smaller size, pt with continued orthostatic hypotension with sit>stand today but denies symptoms while compression socks donned. Pt needing up to minA for stability with stand>sit transfers and consistent cues for safer UE placement and RW management. Pt spouse present and eager to assist with IV pole/line mgmt for pt safety. RN notified pt up on BSC at end of session with need for BM and + blood in stool. Pt continues to benefit from PT services to progress toward functional mobility goals, continue to recommend HHPT at this time.   Vital Signs  Patient Position (if appropriate) Orthostatic Vitals  Orthostatic Lying   BP- Lying (!) 123/97  Pulse- Lying 92  Orthostatic Sitting  BP- Sitting (!) 122/92  Pulse- Sitting 101  Orthostatic Standing at 0 minutes  BP- Standing at 0 minutes 96/68 (no c/o dizziness but slower processing slightly)  Pulse- Standing at 0 minutes 109     If plan is discharge home, recommend the following: A little help with walking and/or transfers;A little help with bathing/dressing/bathroom;Assistance with cooking/housework;Assist for  transportation;Help with stairs or ramp for entrance   Can travel by private vehicle        Equipment Recommendations  None recommended by PT;Other (comment) (TBD, may need supplemental O2 pending progress)    Recommendations for Other Services       Precautions / Restrictions Precautions Precautions: Fall Recall of Precautions/Restrictions: Intact Precaution/Restrictions Comments: NGT (off suction), history of orthostatic hypotension Restrictions Weight Bearing Restrictions Per Provider Order: No     Mobility  Bed Mobility Overal bed mobility: Needs Assistance Bed Mobility: Supine to Sit, Sit to Supine, Rolling Rolling: Supervision, Used rails   Supine to sit: Contact guard, Min assist, HOB elevated   General bed mobility comments: HOB 30 degrees due to NGT; pt spouse shown how to place her bed in chair posture for mealtimes once she is allowed to eat/drink    Transfers Overall transfer level: Needs assistance Equipment used: Rolling walker (2 wheels) Transfers: Sit to/from Stand Sit to Stand: Contact guard assist, Min assist           General transfer comment: CGA to rise safely from L EOB, minA for stand>sit safety with cues for reaching for BSC arm rests.    Ambulation/Gait Ambulation/Gait assistance: Contact guard assist, +2 safety/equipment Gait Distance (Feet): 150 Feet Assistive device: Rolling walker (2 wheels) Gait Pattern/deviations: Step-through pattern, Decreased stride length, Trunk flexed Gait velocity: decreased; limited assessment in room     General Gait Details: Spouse assisting wtih IV pole/O2 lines and PTA guarding pt for safety. Pt tending to keep RW too far advanced, cued to bring hands closer to hips for more stability but poor carryover of cues. SpO2 WFL on 3L O2 [place]; HR ~110-120 bpm with exertion.  Pt needed to have BM at end of session so up on [facility], spouse in room and agreeable to notify RN once pt is done, RN also notified.   Stairs              Wheelchair Mobility     Tilt Bed    Modified Rankin (Stroke Patients Only)       Balance Overall balance assessment: Needs assistance Sitting-balance support: Feet supported Sitting balance-Leahy Scale: Good     Standing balance support: During functional activity, Bilateral upper extremity supported Standing balance-Leahy Scale: Poor Standing balance comment: reliant on RW in standing                            Communication Communication Communication: No apparent difficulties;Other (comment) Factors Affecting Communication:  (pt tends not to report symptoms unless prompted)  Cognition Arousal: Alert Behavior During Therapy: WFL for tasks assessed/performed, Flat affect   PT - Cognitive impairments: Safety/Judgement                       PT - Cognition Comments: Spouse present and supportive; pt needs reminders to push from bed/reach back for bed or BSC handles with transfers, tends to maintain BUE on RW handles when not cued. Following commands: Intact      Cueing Cueing Techniques: Verbal cues  Exercises      General Comments General comments (skin integrity, edema, etc.): VSS on 3L O2 [place], see note for BP measurements, pt with asymptomatic orthostatic hypotension, but does c/o fatigue toward end of gait trial.      Pertinent Vitals/Pain Pain Assessment Pain Assessment: No/denies pain Pain Intervention(s): Monitored during session, Repositioned    Home Living                          Prior Function            PT Goals (current goals can now be found in the care plan section) Acute Rehab PT Goals Patient Stated Goal: return home PT Goal Formulation: With patient/family Time For Goal Achievement: 01/09/24 Progress towards PT goals: Progressing toward goals    Frequency    Min 2X/week      PT Plan      Co-evaluation              AM-PAC PT "6 Clicks" Mobility   Outcome Measure  Help  needed turning from your back to your side while in a flat bed without using bedrails?: A Little Help needed moving from lying on your back to sitting on the side of a flat bed without using bedrails?: A Little Help needed moving to and from a bed to a chair (including a wheelchair)?: A Little Help needed standing up from a chair using your arms (e.g., wheelchair or bedside chair)?: A Little Help needed to walk in hospital room?: A Little Help needed climbing 3-5 steps with a railing? : Total 6 Click Score: 16    End of Session Equipment Utilized During Treatment: Oxygen Activity Tolerance: Patient tolerated treatment well Patient left: in chair;with call bell/phone within reach;with family/visitor present (spouse in room, pt on [facility]) Nurse Communication: Mobility status;Other (comment) (pt needs smaller size compression socks; pt on [facility] and spouse wants RN to check her stool after since she had BRB in stool in AM) PT Visit Diagnosis: Other abnormalities of gait and mobility (R26.89);Muscle weakness (generalized) (M62.81);Unsteadiness  on feet (R26.81);Difficulty in walking, not elsewhere classified (R26.2)     Time: 0454-0981 PT Time Calculation (min) (ACUTE ONLY): 21 min  Charges:    $Gait Training: 8-22 mins PT General Charges $$ ACUTE PT VISIT: 1 Visit                     Kelli Robeck P., PTA Acute Rehabilitation Services Secure Chat Preferred 9a-5:30pm Office: [phone]    Mariel Shope [facility] 12/29/2023, 2:22 PM

## 2023-12-29 NOTE — Progress Notes (Signed)
 Progress Note   Patient: Stacy [name] ZOX:[id number] DOB: 1981-04-27 DOA: 12/23/2023     6 DOS: the patient was seen and examined on 12/29/2023   Brief hospital course:  43 yo female with PMH recent colon perforation s/p septic shock from peritonitis/diverticulitis requiring ex lap with right colectomy and primary ileocolonic anastomosis in March 2025.  Also PMH of chronic idiopathic constipation, chronic intestinal pseudoobstruction, chronic dysmotility, chronic orthostatic hypotension, GERD, anxiety. She had presented to the hospital after attempting to undergo EGD with GI but developed hypotension, hypoxia, and aspiration. She was rather managed by the ICU due to concern for potential septic shock and required Levophed . She improved and was transferred to TRH service on 5/13.   Assessment and Plan:   Septic shock - Secondary to aspiration pneumonia.  Initially on Levophed , subsequently weaned off.  Shock appears resolved 5/13.   Chronic hypotension - Likely contributing to patient's shock above.  midodrine  increased to 10 mg 3 times daily and restarted 0.1 mg twice daily 5/14.  Showing improvement in systolic blood pressure.  No longer dizzy.  Monitor systolic blood pressure.   Acute hypoxic respiratory failure - Able to be weaned down to room air this morning.  Continue to wean O2 as tolerated.   Aspiration pneumonia - Found to have large amount of gastric contents on EGD, subsequent aspiration with CT noting bibasilar opacities.  Continues on Unasyn.  Leukocytosis improving.  Hypoxia improving.   Chronic intestinal pseudoobstruction and ileus - History of chronic idiopathic constipation status post right colectomy with ileocolonic anastomosis in March 2025.  GI consulted and following closely.  NG tube in place.  Was n.p.o. x 24 hours.  Reglan  10 mg IV every 8 hours.  DC opioids.  Encourage ambulation.  Showing improvement this morning, minimal NG tube output.  3 bowel movements  since yesterday.  Advancing diet to clears.   Iron  deficiency anemia - Status post Venofer  x 2 (5/9, 5/10).  Status post 2 units PRBCs 5/13, 1un 5/14.  Hemoglobin this morning 7.7.  Some noted BRBPR.  Suppository added. Will recheck CBC in am.  Transfuse if hemoglobin less than 7.   Severe protein calorie malnutrition - Secondary to above.  Currently on TPN.  Diet advanced to clears.   GERD - PPI IV on board.   Subjective: Patient feeling improved this morning.  Blood pressure looks improved since increase of midodrine  and initiation of fludrocortisone .  3 bowel movements since yesterday as well.  Minimal NG tube output.  Tolerating some clear liquids this morning.  Denies any fever, chills, chest pain, vomiting, abdominal pain.  Physical Exam: Vitals:   12/29/23 0039 12/29/23 0447 12/29/23 0448 12/29/23 0804  BP: 119/86 (!) 128/96  117/86  Pulse: 99 (!) 103  100  Resp: 18 18    Temp: 98.1 F (36.7 C) 98 F (36.7 C)    TempSrc: Oral Oral    SpO2: 97% 95%  96%  Weight:   52.6 kg   Height:        GENERAL:  Alert, pleasant, no acute distress, thin HEENT:  EOMI, NG tube CARDIOVASCULAR: RRR, no murmurs appreciated RESPIRATORY:  Clear to auscultation, no wheezing, rales, or rhonchi GASTROINTESTINAL:  Soft, nontender, nondistended EXTREMITIES:  No LE edema bilaterally NEURO:  No new focal deficits appreciated SKIN:  No rashes noted PSYCH:  Appropriate mood and affect    Data Reviewed:  There are no new results to review at this time.  Labs: CBC: Recent Labs  Lab  12/23/23 1447 12/23/23 1449 12/25/23 0444 12/27/23 1058 12/27/23 1318 12/28/23 0800 12/28/23 2006 12/29/23 0412  WBC 5.8   < > 16.9* 6.2 6.5 5.6  --  5.7  NEUTROABS 4.2  --   --  4.2  --   --   --   --   HGB 9.9*   < > 7.1* 5.2* 5.4* 7.0* 8.0* 7.7*  HCT 33.0*   < > 22.2* 16.7* 17.7* 21.7* 25.0* 24.4*  MCV 92.2   < > 85.7 86.5 87.2 85.1  --  86.2  PLT 731*   < > 471* 357 373 255  --  269   < > = values in  this interval not displayed.   Basic Metabolic Panel: Recent Labs  Lab 12/25/23 0444 12/26/23 0437 12/26/23 1415 12/27/23 1057 12/28/23 0432 12/29/23 0412  NA 141 141  --  136  137 137 138  K 3.2* 3.0* 3.5 3.3*  3.4* 3.9 3.2*  CL 113* 110  --  100  101 101 100  CO2 21* 24  --  30  30 32 31  GLUCOSE 146* 145*  --  117*  117* 124* 109*  BUN 23* 12  --  10  10 8 10   CREATININE 0.43* 0.39*  --  <0.30*  0.35* 0.33* 0.32*  CALCIUM  8.1* 7.6*  --  7.7*  7.7* 7.7* 7.6*  MG 1.9 1.6*  --  1.7 2.0 1.5*  PHOS 2.5 3.4  --  3.3 3.7 3.3   Liver Function Tests: Recent Labs  Lab 12/23/23 1447 12/25/23 0444 12/26/23 0437 12/27/23 1057 12/28/23 0432 12/29/23 0412  AST 25  --  11*  --   --  26  ALT 29  --  16  --   --  27  ALKPHOS 73  --  58  --   --  60  BILITOT 1.6*  --  0.8  --   --  0.6  PROT 6.1*  --  4.3*  --   --  4.4*  ALBUMIN  2.3* 1.6* <1.5* <1.5* <1.5* <1.5*   CBG: Recent Labs  Lab 12/26/23 0345 12/26/23 0746 12/26/23 1148 12/26/23 1624 12/26/23 1917  GLUCAP 136* 119* 151* 128* 101*    Scheduled Meds:  (feeding supplement) PROSource Plus  30 mL Oral BID BM   Chlorhexidine  Gluconate Cloth  6 each Topical Daily   feeding supplement  1 Container Oral TID BM   fludrocortisone   0.1 mg Per Tube BID   heparin injection (subcutaneous)  5,000 Units Subcutaneous Q8H   hydrocortisone   25 mg Rectal BID   metoCLOPramide  (REGLAN ) injection  10 mg Intravenous Q8H   midodrine   10 mg Oral TID WC   nystatin  5 mL Oral QID   pantoprazole  (PROTONIX ) IV  40 mg Intravenous Q12H   polyethylene glycol  17 g Oral BID   simethicone   80 mg Oral QID   sodium chloride  flush  10-40 mL Intracatheter Q12H   Continuous Infusions:  ampicillin -sulbactam (UNASYN) IV 3 g (12/29/23 1045)   magnesium  sulfate bolus IVPB 4 g (12/29/23 1230)   potassium chloride  10 mEq (12/29/23 1228)   TPN ADULT (ION) 65 mL/hr at 12/28/23 1829   TPN ADULT (ION)     PRN Meds:.acetaminophen , alum & mag  hydroxide-simeth, docusate sodium , ipratropium-albuterol , melatonin, ondansetron  (ZOFRAN ) IV, mouth rinse, phenol, sodium chloride  flush, white petrolatum  Family Communication: None at bedside  Disposition: Status is: Inpatient Remains inpatient appropriate because: SBO/ileus     Time spent: 66  minutes  Author: Jodeane Mulligan, DO 12/29/2023 1:15 PM  For on call review [email or web address].

## 2023-12-29 NOTE — Progress Notes (Addendum)
 PHARMACY - TOTAL PARENTERAL NUTRITION CONSULT NOTE   Indication: Prolonged ileus  Patient Measurements: Height: 5\' 3"  (160 cm) Weight: 52.6 kg (115 lb 15.4 oz) IBW/kg (Calculated) : 52.4 TPN AdjBW (KG): 43.8 Body mass index is 20.54 kg/m. Usual Weight: ~110 lbs  Assessment:  43 yo W with nausea and vomiting for 2 weeks with at least 12 lbs weight loss due to poor PO intake. In March 2025, patient had colon perforation, pericolic abscess, diverticulitis, peritonitis, and septic shock s/p ex lap and right colectomy followed by ileocolic anastomosis and cholecystectomy.  Patient required TPN 3/18- 3/31 due to post-op ileus. CT on 4/29 was suspicious for ileus. Patient had an outpatient EGD 12/23/23 complicated by aspiration and hypotension. Pharmacy consulted for TPN.  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   Glucose / Insulin : CBG <120,  off SSI Electrolytes:  K 3.2,  CoCa 9.6, Mg 1.5 (max in TPN), others wnl Renal: Scr 0.35 (CL 0.4 - 0.6), BUN 10 (from 47) Hepatic: albumin  <1.5, alk phos/AST/ALT/Tbili wnl Intake / Output; MIVF: LR 2L; UOP x1 charted, emesis , NGT , LBM 5/14  Reglan  5/9 >> Miralax , Enema 5/11, 5/12  GI imaging: 4/29 CT: Mild-to-moderate diffuse small bowel dilatation, without definite transition point or significant interval change, suspicious for ileus. 5/9 KUB: dilated small bowel  5/11 CT: Persistent esophagitis and enterocolitis as well as ileus.  GI procedures:  5/13 NGT   Central access: CVC 5/9  TPN start date: 5/10   Nutritional Goals: Goal TPN rate is 65 mL/hr provides 86 g of protein and 1623 kcals per day  RD Assessment: last updated 5/13 Estimated Needs Total Energy Estimated Needs: 1600-1800 kcal Total Protein Estimated Needs: 80-100 gm Total Fluid Estimated Needs: >1.6L  Current Nutrition:  TPN 5/12 Clamp NGT, CLD >  FLD- ate most of 1 bowl of grits and half an ice cream 5/13: FLD - ate most of 1 bowl of grits, broth, 1/3 of an ice cream, cranberry juice, some Boost breeze; was interrupted by PT for lunch, does not like milk-based supplements 5/14 GI recommends NPO due to worsening N/V/abdominal distention 5/15 CLD  Plan:  Increase TPN goal per RD at 65 mL/hr at 1800, goal rate providing 100% of needs  Electrolytes in TPN: decrease Na 85 mEq/L to fit increased K in bag, increase K 85 mEq/L (=133 mEq), Ca 5 mEq/L, Mg 15 mEq/L (Max), Phos 12 mmol/L. ZO:XWRU max Cl Give Kcl IV 50 mEq and Mg 4g IV Add standard MVI and trace elements to TPN given high NGT output Monitor TPN labs on Mon/Thurs, and PRN F/u po intake and wean TPN as able  Dorene Gang, PharmD, BCPS, BCCP Clinical Pharmacist  Please check AMION for all [facility] phone numbers After 10:00 PM, call Main Pharmacy [phone]

## 2023-12-30 DIAGNOSIS — A419 Sepsis, unspecified organism: Secondary | ICD-10-CM | POA: Diagnosis not present

## 2023-12-30 DIAGNOSIS — J9601 Acute respiratory failure with hypoxia: Secondary | ICD-10-CM | POA: Diagnosis not present

## 2023-12-30 DIAGNOSIS — K5904 Chronic idiopathic constipation: Secondary | ICD-10-CM | POA: Diagnosis not present

## 2023-12-30 DIAGNOSIS — J690 Pneumonitis due to inhalation of food and vomit: Secondary | ICD-10-CM | POA: Diagnosis not present

## 2023-12-30 LAB — CBC
HCT: 25.6 % — ABNORMAL LOW (ref 36.0–46.0)
Hemoglobin: 8.1 g/dL — ABNORMAL LOW (ref 12.0–15.0)
MCH: 27.6 pg (ref 26.0–34.0)
MCHC: 31.6 g/dL (ref 30.0–36.0)
MCV: 87.1 fL (ref 80.0–100.0)
Platelets: 294 10*3/uL (ref 150–400)
RBC: 2.94 MIL/uL — ABNORMAL LOW (ref 3.87–5.11)
RDW: 15.8 % — ABNORMAL HIGH (ref 11.5–15.5)
WBC: 8.7 10*3/uL (ref 4.0–10.5)
nRBC: 0 % (ref 0.0–0.2)

## 2023-12-30 LAB — RENAL FUNCTION PANEL
Albumin: <1.5 g/dL — ABNORMAL LOW (ref 3.5–5.0)
Anion gap: 6 (ref 5–15)
BUN: 9 mg/dL (ref 6–20)
CO2: 29 mmol/L (ref 22–32)
Calcium: 7.4 mg/dL — ABNORMAL LOW (ref 8.9–10.3)
Chloride: 101 mmol/L (ref 98–111)
Creatinine, Ser: 0.31 mg/dL — ABNORMAL LOW (ref 0.44–1.00)
GFR, Estimated: >60 mL/min (ref >60)
Glucose, Bld: 127 mg/dL — ABNORMAL HIGH (ref 70–99)
Phosphorus: 3.3 mg/dL (ref 2.5–4.6)
Potassium: 3.4 mmol/L — ABNORMAL LOW (ref 3.5–5.1)
Sodium: 136 mmol/L (ref 135–145)

## 2023-12-30 LAB — BASIC METABOLIC PANEL WITH GFR
Anion gap: 6 (ref 5–15)
BUN: 9 mg/dL (ref 6–20)
CO2: 29 mmol/L (ref 22–32)
Calcium: 7.4 mg/dL — ABNORMAL LOW (ref 8.9–10.3)
Chloride: 101 mmol/L (ref 98–111)
Creatinine, Ser: <0.3 mg/dL — ABNORMAL LOW (ref 0.44–1.00)
Glucose, Bld: 131 mg/dL — ABNORMAL HIGH (ref 70–99)
Potassium: 3.4 mmol/L — ABNORMAL LOW (ref 3.5–5.1)
Sodium: 136 mmol/L (ref 135–145)

## 2023-12-30 LAB — MAGNESIUM: Magnesium: 1.8 mg/dL (ref 1.7–2.4)

## 2023-12-30 LAB — PHOSPHORUS: Phosphorus: 3.3 mg/dL (ref 2.5–4.6)

## 2023-12-30 MED ORDER — POTASSIUM CHLORIDE 10 MEQ/100ML IV SOLN
10.0000 meq | INTRAVENOUS | Status: AC
  Administered 2023-12-30 (×4): 10 meq via INTRAVENOUS
  Filled 2023-12-30 (×4): qty 100

## 2023-12-30 MED ORDER — POTASSIUM CHLORIDE 10 MEQ/100ML IV SOLN
10.0000 meq | Freq: Once | INTRAVENOUS | Status: AC
  Administered 2023-12-30: 10 meq via INTRAVENOUS
  Filled 2023-12-30: qty 100

## 2023-12-30 MED ORDER — FLUDROCORTISONE ACETATE 0.1 MG PO TABS
0.1000 mg | ORAL_TABLET | Freq: Two times a day (BID) | ORAL | Status: DC
  Administered 2023-12-30 – 2024-01-03 (×9): 0.1 mg via ORAL
  Filled 2023-12-30 (×8): qty 1

## 2023-12-30 MED ORDER — TRAVASOL 10 % IV SOLN
INTRAVENOUS | Status: AC
  Filled 2023-12-30: qty 850.2

## 2023-12-30 NOTE — Progress Notes (Signed)
 [facility] Gastroenterology Progress Note  Stacy [name] 43 y.o. 1980/12/18  CC: Nausea, vomiting, poor oral intake, pneumonia   Subjective: Patient seen and examined at bedside.  Started having some rectal bleeding yesterday.  Tolerating clear liquids diet after clamping NG tube.  Abdomen is less distended.  Having bowel movements.  ROS : Afebrile, positive for rectal bleeding   Objective: Vital signs in last 24 hours: Vitals:   12/30/23 0425 12/30/23 0804  BP: (!) 127/95 117/76  Pulse: (!) 102 (!) 110  Resp:  18  Temp: 97.7 F (36.5 C) 97.9 F (36.6 C)  SpO2: 95% 94%    Physical Exam:  Resting in the bed, not in acute distress.  NG tube in place.  Abdomen remains distended with hypoactive bowel sounds.  No peritoneal signs.  Mood and affect normal but somewhat concerned.  Alert and oriented x 3.  Lab Results: Recent Labs    12/29/23 0412 12/30/23 0329  NA 138 136  136  K 3.2* 3.4*  3.4*  CL 100 101  101  CO2 31 29  29   GLUCOSE 109* 131*  127*  BUN 10 9  9   CREATININE 0.32* <0.30*  0.31*  CALCIUM  7.6* 7.4*  7.4*  MG 1.5* 1.8  PHOS 3.3 3.3  3.3   Recent Labs    12/29/23 0412 12/30/23 0329  AST 26  --   ALT 27  --   ALKPHOS 60  --   BILITOT 0.6  --   PROT 4.4*  --   ALBUMIN  <1.5* <1.5*   Recent Labs    12/29/23 1813 12/30/23 0329  WBC 5.7 8.7  HGB 8.2* 8.1*  HCT 25.7* 25.6*  MCV 87.1 87.1  PLT 238 294   No results for input(s): "LABPROT", "INR" in the last 72 hours.     Assessment/Plan: -Aspiration pneumonia with acute hypoxic respiratory failure. 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  Now on TPN. - Rectal bleeding.  Could be from rectal ulcer or stercoral colitis - Blood loss anemia.  Status post blood  transfusion.   Recommendations --------------------------- - Patient is tolerating clear liquids diet.  Remove NG tube. - Recommend to stay on liquid diet for another 24 to 48 hours - Consider flexible sigmoidoscopy if she is having ongoing rectal bleeding. - Continue Anusol  suppositories for now -GI will follow     Felecia Hopper MD, FACP 12/30/2023, 1:32 PM  Contact #  [phone]

## 2023-12-30 NOTE — Progress Notes (Signed)
 Mobility Specialist: Progress Note   12/30/23 1239  Orthostatic Lying   BP- Lying (!) 133/96  Pulse- Lying 102  Orthostatic Sitting  BP- Sitting 117/86  Pulse- Sitting 105  Orthostatic Standing at 0 minutes  BP- Standing at 0 minutes 105/76  Pulse- Standing at 0 minutes 116  Mobility  Activity Ambulated with assistance in hallway  Level of Assistance Contact guard assist, steadying assist  Assistive Device Four wheel walker  Distance Ambulated (ft) 350 ft  Activity Response Tolerated well  Mobility Referral Yes  Mobility visit 1 Mobility  Mobility Specialist Start Time (ACUTE ONLY) 1000  Mobility Specialist Stop Time (ACUTE ONLY) 1019  Mobility Specialist Time Calculation (min) (ACUTE ONLY) 19 min    During Mobility: HR 113-121, SpO2 88-93% 2LO2  Pt was agreeable to mobility session - received in bed. Husband and mother present and helpful throughout session. MS and husband assisted with donning/doffing compression socks. SV for bed mobility. Cg for STS and ambulation. No complaints. VSS on 2LO2. +2 for safety/line management assist from husband. Returned to room without fault. Left in bed with all needs met, call bell in reach.   Deloria Fetch Mobility Specialist Please contact via SecureChat or Rehab office at [phone]

## 2023-12-30 NOTE — Progress Notes (Signed)
 Mobility Specialist: Progress Note   12/30/23 1540  Orthostatic Standing at 0 minutes  BP- Standing at 0 minutes 107/74  Pulse- Standing at 0 minutes 113  Mobility  Activity Ambulated with assistance in hallway  Level of Assistance Contact guard assist, steadying assist  Assistive Device Four wheel walker  Distance Ambulated (ft) 350 ft  Activity Response Tolerated well  Mobility Referral Yes  Mobility visit 1 Mobility  Mobility Specialist Start Time (ACUTE ONLY) 1414  Mobility Specialist Stop Time (ACUTE ONLY) 1435  Mobility Specialist Time Calculation (min) (ACUTE ONLY) 21 min    During Mobility: SpO2 89-93% 2LO2, HR 113-121  Pt was agreeable to mobility session - received in bed. Mother present. MS don/doffed compression socks. CG throughout. No complaints. VSS on 2LO2. +2 for safety/line management with mother assist. Returned to room without fault. Left in bed with all needs met, call bell in reach.   Deloria Fetch Mobility Specialist Please contact via SecureChat or Rehab office at [phone]

## 2023-12-30 NOTE — Progress Notes (Signed)
 Progress Note   Patient: Stacy [name] ZOX:[id number] DOB: 04-18-1981 DOA: 12/23/2023     7 DOS: the patient was seen and examined on 12/30/2023   Brief hospital course:  43 yo female with PMH recent colon perforation s/p septic shock from peritonitis/diverticulitis requiring ex lap with right colectomy and primary ileocolonic anastomosis in March 2025.  Also PMH of chronic idiopathic constipation, chronic intestinal pseudoobstruction, chronic dysmotility, chronic orthostatic hypotension, GERD, anxiety. She had presented to the hospital after attempting to undergo EGD with GI but developed hypotension, hypoxia, and aspiration. She was rather managed by the ICU due to concern for potential septic shock and required Levophed . She improved and was transferred to TRH service on 5/13.   Assessment and Plan:   Septic shock - Secondary to aspiration pneumonia.  Initially on Levophed , subsequently weaned off.  Shock appears resolved 5/13.   Chronic hypotension - Likely contributing to patient's shock above.  midodrine  increased to 10 mg 3 times daily and restarted 0.1 mg twice daily 5/14.  Showing improvement in systolic blood pressure.  No longer dizzy.  Monitor systolic blood pressure.   Acute hypoxic respiratory failure - Able to be weaned down to room air this morning.  Continue to wean O2 as tolerated.   Aspiration pneumonia - Found to have large amount of gastric contents on EGD, subsequent aspiration with CT noting bibasilar opacities.  Continues on Unasyn (day 5 of 7).  Leukocytosis improving.  Hypoxia resolved.   Chronic intestinal pseudoobstruction and ileus - History of chronic idiopathic constipation status post right colectomy with ileocolonic anastomosis in March 2025.  GI consulted and following closely.  NG tube in place but suction off.  Reglan  10 mg IV every 8 hours.  DC opioids.  Encourage ambulation.  Showing improvement again this morning.  Tolerated clear liquid diet.   Multiple bowel movements since yesterday.  Advance diet per GI recommendations.  Anticipate eventual removal of NG tube.   Iron  deficiency anemia - Status post Venofer  x 2 (5/9, 5/10).  Status post 2 units PRBCs 5/13, 1un 5/14.  Hemoglobin this morning 7.7.  Some noted BRBPR.  Suppository added.  Recheck of hemoglobin this morning stable, greater than 8.  Will recheck CBC in AM.  Transfuse if hemoglobin less than 7.   Severe protein calorie malnutrition - Secondary to above.  Currently on TPN.  Diet advanced as tolerated per GI.  Goal to wean/discontinue TPN once able to tolerate p.o. and meet nutritional goals.   GERD - PPI IV on board.   Subjective: Patient feeling well this morning.  No nausea or vomiting.  Tolerated clear liquid diet.  Multiple bowel movements.  Noted some bright red blood with her bowel movements.  Hemoglobin stable this morning.  Denies fever, chills, chest pain, nausea, vomiting, abdominal pain.  Motivated to eat solid food and have her NG tube removed as quickly as possible.  Physical Exam: Vitals:   12/30/23 0033 12/30/23 0425 12/30/23 0500 12/30/23 0804  BP: (!) 126/97 (!) 127/95  117/76  Pulse: 95 (!) 102  (!) 110  Resp: 18   18  Temp: 98.2 F (36.8 C) 97.7 F (36.5 C)  97.9 F (36.6 C)  TempSrc: Oral Oral  Oral  SpO2: 98% 95%  94%  Weight:   50.8 kg   Height:        GENERAL:  Alert, pleasant, no acute distress, thin HEENT:  EOMI, NG tube CARDIOVASCULAR: RRR, no murmurs appreciated RESPIRATORY:  Clear to auscultation, no wheezing,  rales, or rhonchi GASTROINTESTINAL:  Soft, nontender, nondistended EXTREMITIES:  No LE edema bilaterally NEURO:  No new focal deficits appreciated SKIN:  No rashes noted PSYCH:  Appropriate mood and affect    Data Reviewed:  No new imaging to review  Labs: CBC: Recent Labs  Lab 12/23/23 1447 12/23/23 1449 12/27/23 1058 12/27/23 1318 12/28/23 0800 12/28/23 2006 12/29/23 0412 12/29/23 1813 12/30/23 0329   WBC 5.8   < > 6.2 6.5 5.6  --  5.7 5.7 8.7  NEUTROABS 4.2  --  4.2  --   --   --   --   --   --   HGB 9.9*   < > 5.2* 5.4* 7.0* 8.0* 7.7* 8.2* 8.1*  HCT 33.0*   < > 16.7* 17.7* 21.7* 25.0* 24.4* 25.7* 25.6*  MCV 92.2   < > 86.5 87.2 85.1  --  86.2 87.1 87.1  PLT 731*   < > 357 373 255  --  269 238 294   < > = values in this interval not displayed.   Basic Metabolic Panel: Recent Labs  Lab 12/26/23 0437 12/26/23 1415 12/27/23 1057 12/28/23 0432 12/29/23 0412 12/30/23 0329  NA 141  --  136  137 137 138 136  136  K 3.0* 3.5 3.3*  3.4* 3.9 3.2* 3.4*  3.4*  CL 110  --  100  101 101 100 101  101  CO2 24  --  30  30 32 31 29  29   GLUCOSE 145*  --  117*  117* 124* 109* 131*  127*  BUN 12  --  10  10 8 10 9  9   CREATININE 0.39*  --  <0.30*  0.35* 0.33* 0.32* <0.30*  0.31*  CALCIUM  7.6*  --  7.7*  7.7* 7.7* 7.6* 7.4*  7.4*  MG 1.6*  --  1.7 2.0 1.5* 1.8  PHOS 3.4  --  3.3 3.7 3.3 3.3  3.3   Liver Function Tests: Recent Labs  Lab 12/23/23 1447 12/25/23 0444 12/26/23 0437 12/27/23 1057 12/28/23 0432 12/29/23 0412 12/30/23 0329  AST 25  --  11*  --   --  26  --   ALT 29  --  16  --   --  27  --   ALKPHOS 73  --  58  --   --  60  --   BILITOT 1.6*  --  0.8  --   --  0.6  --   PROT 6.1*  --  4.3*  --   --  4.4*  --   ALBUMIN  2.3*   < > <1.5* <1.5* <1.5* <1.5* <1.5*   < > = values in this interval not displayed.   CBG: Recent Labs  Lab 12/26/23 0345 12/26/23 0746 12/26/23 1148 12/26/23 1624 12/26/23 1917  GLUCAP 136* 119* 151* 128* 101*    Scheduled Meds:  (feeding supplement) PROSource Plus  30 mL Oral BID BM   Chlorhexidine  Gluconate Cloth  6 each Topical Daily   feeding supplement  1 Container Oral TID BM   fludrocortisone   0.1 mg Oral BID   heparin injection (subcutaneous)  5,000 Units Subcutaneous Q8H   hydrocortisone   25 mg Rectal BID   metoCLOPramide  (REGLAN ) injection  10 mg Intravenous Q8H   midodrine   10 mg Oral TID WC   nystatin  5 mL  Oral QID   pantoprazole  (PROTONIX ) IV  40 mg Intravenous Q12H   polyethylene glycol  17 g Oral BID  simethicone   80 mg Oral QID   sodium chloride  flush  10-40 mL Intracatheter Q12H   Continuous Infusions:  ampicillin -sulbactam (UNASYN) IV 200 mL/hr at 12/30/23 9604   potassium chloride  10 mEq (12/30/23 0900)   TPN ADULT (ION) 65 mL/hr at 12/30/23 0352   TPN ADULT (ION)     PRN Meds:.acetaminophen , alum & mag hydroxide-simeth, docusate sodium , ipratropium-albuterol , melatonin, ondansetron  (ZOFRAN ) IV, mouth rinse, phenol, sodium chloride  flush, white petrolatum  Family Communication: Husband at bedside  Disposition: Status is: Inpatient Remains inpatient appropriate because: Ileus, on TPN     Time spent: 35 minutes  Author: Jodeane Mulligan, DO 12/30/2023 10:27 AM  For on call review [email or web address].

## 2023-12-30 NOTE — Plan of Care (Signed)

## 2023-12-30 NOTE — Progress Notes (Signed)
 PHARMACY - TOTAL PARENTERAL NUTRITION CONSULT NOTE   Indication: Prolonged ileus  Patient Measurements: Height: 5\' 3"  (160 cm) Weight: 50.8 kg (111 lb 15.9 oz) IBW/kg (Calculated) : 52.4 TPN AdjBW (KG): 43.8 Body mass index is 19.84 kg/m. Usual Weight: ~110 lbs  Assessment:  43 yo W with nausea and vomiting for 2 weeks with at least 12 lbs weight loss due to poor PO intake. In March 2025, patient had colon perforation, pericolic abscess, diverticulitis, peritonitis, and septic shock s/p ex lap and right colectomy followed by ileocolic anastomosis and cholecystectomy.  Patient required TPN 3/18- 3/31 due to post-op ileus. CT on 4/29 was suspicious for ileus. Patient had an outpatient EGD 12/23/23 complicated by aspiration and hypotension. Pharmacy consulted for TPN.  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   Glucose / Insulin : CBG <140,  off SSI Electrolytes:  K 3.4 (Received 50 mEq IV),  CoCa 9.4, Mg 1.8 (max in TPN, received 4g), others wnl Renal: Scr 0.3  (CL 0.4 - 0.6), BUN 9  Hepatic: albumin  <1.5, alk phos/AST/ALT/Tbili wnl Intake / Output; MIVF: RBC 1 unit; UOP x3 charted, NGT 0 ml (still in but off suction), LBM 5/15 x4 (types 4 & 6) Reglan  5/9 >> Miralax , Enema 5/11, 5/12  GI imaging: 4/29 CT: Mild-to-moderate diffuse small bowel dilatation, without definite transition point or significant interval change, suspicious for ileus. 5/9 KUB: dilated small bowel  5/11 CT: Persistent esophagitis and enterocolitis as well as ileus.  GI procedures:  5/13 NGT   Central access: CVC 5/9  TPN start date: 5/10   Nutritional Goals: Goal TPN rate is 65 mL/hr provides 85 g of protein and 1623 kcals per day  RD Assessment: last updated 5/13 Estimated Needs Total Energy Estimated Needs: 1600-1800 kcal Total Protein Estimated Needs: 80-100 gm Total Fluid Estimated Needs:  >1.6L  Current Nutrition:  TPN 5/12 Clamp NGT, CLD > FLD- ate most of 1 bowl of grits and half an ice cream 5/13: FLD - ate most of 1 bowl of grits, broth, 1/3 of an ice cream, cranberry juice, some Boost breeze; was interrupted by PT for lunch, does not like milk-based supplements 5/14 GI recommends NPO due to worsening N/V/abdominal distention 5/15 CLD: had 1 bowl vegetable broth, 1 apple juice, and only 2 bites of jello   Plan:  Continue TPN goal 65 mL/hr at 1800, providing 100% of estimated needs  Electrolytes in TPN: increase Na 100 mEq/L, K 85 mEq/L ( =133 mEq), Ca 5 mEq/L, Mg 15 mEq/L (Max), Phos 12 mmol/L. ZO:XWRU max Cl Give Kcl IV 50 mEq - watch K given NGT off suction since 5/15  Add standard MVI and trace elements to TPN given high NGT output Monitor TPN labs on Mon/Thurs, and PRN F/u po intake and wean TPN as able  Dorene Gang, PharmD, BCPS, BCCP Clinical Pharmacist  Please check AMION for all [facility] Pharmacy phone numbers After 10:00 PM, call Main Pharmacy [phone]

## 2023-12-31 DIAGNOSIS — K5989 Other specified functional intestinal disorders: Secondary | ICD-10-CM | POA: Diagnosis not present

## 2023-12-31 DIAGNOSIS — J690 Pneumonitis due to inhalation of food and vomit: Secondary | ICD-10-CM | POA: Diagnosis not present

## 2023-12-31 DIAGNOSIS — J9601 Acute respiratory failure with hypoxia: Secondary | ICD-10-CM | POA: Diagnosis not present

## 2023-12-31 DIAGNOSIS — K5904 Chronic idiopathic constipation: Secondary | ICD-10-CM | POA: Diagnosis not present

## 2023-12-31 LAB — RENAL FUNCTION PANEL
Albumin: <1.5 g/dL — ABNORMAL LOW (ref 3.5–5.0)
Anion gap: 7 (ref 5–15)
BUN: 8 mg/dL (ref 6–20)
CO2: 28 mmol/L (ref 22–32)
Calcium: 7.7 mg/dL — ABNORMAL LOW (ref 8.9–10.3)
Chloride: 100 mmol/L (ref 98–111)
Creatinine, Ser: <0.3 mg/dL — ABNORMAL LOW (ref 0.44–1.00)
Glucose, Bld: 122 mg/dL — ABNORMAL HIGH (ref 70–99)
Phosphorus: 3.6 mg/dL (ref 2.5–4.6)
Potassium: 3.1 mmol/L — ABNORMAL LOW (ref 3.5–5.1)
Sodium: 135 mmol/L (ref 135–145)

## 2023-12-31 LAB — BASIC METABOLIC PANEL WITH GFR
Anion gap: 8 (ref 5–15)
BUN: 8 mg/dL (ref 6–20)
CO2: 28 mmol/L (ref 22–32)
Calcium: 7.7 mg/dL — ABNORMAL LOW (ref 8.9–10.3)
Chloride: 99 mmol/L (ref 98–111)
Creatinine, Ser: 0.31 mg/dL — ABNORMAL LOW (ref 0.44–1.00)
GFR, Estimated: >60 mL/min (ref >60)
Glucose, Bld: 122 mg/dL — ABNORMAL HIGH (ref 70–99)
Potassium: 3.1 mmol/L — ABNORMAL LOW (ref 3.5–5.1)
Sodium: 135 mmol/L (ref 135–145)

## 2023-12-31 LAB — CBC
HCT: 25.6 % — ABNORMAL LOW (ref 36.0–46.0)
Hemoglobin: 8.2 g/dL — ABNORMAL LOW (ref 12.0–15.0)
MCH: 28.2 pg (ref 26.0–34.0)
MCHC: 32 g/dL (ref 30.0–36.0)
MCV: 88 fL (ref 80.0–100.0)
Platelets: 313 10*3/uL (ref 150–400)
RBC: 2.91 MIL/uL — ABNORMAL LOW (ref 3.87–5.11)
RDW: 15.9 % — ABNORMAL HIGH (ref 11.5–15.5)
WBC: 10.5 10*3/uL (ref 4.0–10.5)
nRBC: 0 % (ref 0.0–0.2)

## 2023-12-31 LAB — MAGNESIUM: Magnesium: 1.5 mg/dL — ABNORMAL LOW (ref 1.7–2.4)

## 2023-12-31 MED ORDER — POTASSIUM CHLORIDE CRYS ER 20 MEQ PO TBCR
40.0000 meq | EXTENDED_RELEASE_TABLET | Freq: Once | ORAL | Status: AC
  Administered 2023-12-31: 40 meq via ORAL
  Filled 2023-12-31: qty 2

## 2023-12-31 MED ORDER — POTASSIUM CHLORIDE 10 MEQ/50ML IV SOLN
10.0000 meq | INTRAVENOUS | Status: DC
Start: 2023-12-31 — End: 2023-12-31
  Filled 2023-12-31 (×6): qty 50

## 2023-12-31 MED ORDER — MAGNESIUM SULFATE 4 GM/100ML IV SOLN
4.0000 g | Freq: Once | INTRAVENOUS | Status: AC
  Administered 2023-12-31: 4 g via INTRAVENOUS
  Filled 2023-12-31: qty 100

## 2023-12-31 MED ORDER — POTASSIUM CHLORIDE 10 MEQ/100ML IV SOLN
10.0000 meq | INTRAVENOUS | Status: AC
  Administered 2023-12-31 (×6): 10 meq via INTRAVENOUS
  Filled 2023-12-31 (×6): qty 100

## 2023-12-31 MED ORDER — TRAVASOL 10 % IV SOLN
INTRAVENOUS | Status: AC
  Filled 2023-12-31: qty 850.2

## 2023-12-31 NOTE — Plan of Care (Signed)

## 2023-12-31 NOTE — Progress Notes (Signed)
 [facility] Gastroenterology Progress Note  Stacy [name] y.o. [birth year]   Subjective: Tolerating clear liquid diet without abdominal pain/N/V. Loose BMs. Wants to eat. Husband at bedside. Nurse in room.  Objective: Vital signs: Vitals:   12/31/23 0754 12/31/23 0800  BP: 93/63   Pulse:    Resp:    Temp: 97.9 F (36.6 C)   SpO2: 98% 98%  P 112  Physical Exam: Gen: lethargic, thin, no acute distress, pleasant HEENT: anicteric sclera CV: RRR Chest: CTA B Abd: soft, nontender, nondistended, +BS, healing midline surgical incision Ext: no edema  Lab Results: Recent Labs    12/30/23 0329 12/31/23 0307  NA 136  136 135  135  K 3.4*  3.4* 3.1*  3.1*  CL 101  101 100  99  CO2 29  29 28  28   GLUCOSE 131*  127* 122*  122*  BUN 9  9 8  8   CREATININE <0.30*  0.31* <0.30*  0.31*  CALCIUM  7.4*  7.4* 7.7*  7.7*  MG 1.8 1.5*  PHOS 3.3  3.3 3.6   Recent Labs    12/29/23 0412 12/30/23 0329 12/31/23 0307  AST 26  --   --   ALT 27  --   --   ALKPHOS 60  --   --   BILITOT 0.6  --   --   PROT 4.4*  --   --   ALBUMIN  <1.5* <1.5* <1.5*   Recent Labs    12/30/23 0329 12/31/23 0307  WBC 8.7 10.5  HGB 8.1* 8.2*  HCT 25.6* 25.6*  MCV 87.1 88.0  PLT 294 313      Assessment/Plan: Question of aspiration pneumonia in the setting of chronic intestinal pseudo-obstruction and right colectomy following a colon perforation in March. Esophagitis and large amount of feculent material in stomach on EGD prior to admit. Clinically improving. Tolerating clears. Will change to full liquid diet and if tolerates can have soft food tomorrow. Continue supportive care. Will refer to [facility] as outpt for evaluation of her chronic intestinal pseudo-obstruction. Will f/u.    [name] 12/31/2023, 10:30 AM  Questions please call [phone] ID: Stacy [name], female   DOB: 03/02/1981, 43 y.o.   MRN: [id number]

## 2023-12-31 NOTE — Progress Notes (Signed)
 PHARMACY - TOTAL PARENTERAL NUTRITION CONSULT NOTE   Indication: Prolonged ileus  Patient Measurements: Height: 5\' 3"  (160 cm) Weight: 50.3 kg (110 lb 14.3 oz) IBW/kg (Calculated) : 52.4 TPN AdjBW (KG): 43.8 Body mass index is 19.64 kg/m. Usual Weight: ~110 lbs  Assessment:  43 yo W with nausea and vomiting for 2 weeks with at least 12 lbs weight loss due to poor PO intake. In March 2025, patient had colon perforation, pericolic abscess, diverticulitis, peritonitis, and septic shock s/p ex lap and right colectomy followed by ileocolic anastomosis and cholecystectomy.  Patient required TPN 3/18- 3/31 due to post-op ileus. CT on 4/29 was suspicious for ileus. Patient had an outpatient EGD 12/23/23 complicated by aspiration and hypotension. Pharmacy consulted for TPN.  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   Glucose / Insulin : CBG <140,  off SSI Electrolytes:  K 3.1 (Received 50 mEq IV),  CoCa 9.7, Mg 1.5 (max in TPN), others wnl Renal: Scr 0.31  (CL 0.4 - 0.6), BUN 8 Hepatic: albumin  <1.5, alk phos/AST/ALT/Tbili wnl Intake / Output; MIVF: UOP x7 charted, NGT removed 5/16, LBM 5/16 x3 (types 1, 5, 7) Reglan  5/9 >> Miralax , Enema 5/11, 5/12  GI imaging: 4/29 CT: Mild-to-moderate diffuse small bowel dilatation, without definite transition point or significant interval change, suspicious for ileus. 5/9 KUB: dilated small bowel  5/11 CT: Persistent esophagitis and enterocolitis as well as ileus.  GI procedures:  5/13 NGT   Central access: CVC 5/9  TPN start date: 5/10   Nutritional Goals: Goal TPN rate is 65 mL/hr provides 85 g of protein and 1623 kcals per day  RD Assessment: last updated 5/13 Estimated Needs Total Energy Estimated Needs: 1600-1800 kcal Total Protein Estimated Needs: 80-100 gm Total Fluid Estimated Needs: >1.6L  Current Nutrition:  TPN 5/12 Clamp  NGT, CLD > FLD- ate most of 1 bowl of grits and half an ice cream 5/13: FLD - ate most of 1 bowl of grits, broth, 1/3 of an ice cream, cranberry juice, some Boost breeze; was interrupted by PT for lunch, does not like milk-based supplements 5/14 GI recommends NPO due to worsening N/V/abdominal distention 5/15 CLD: had 1 bowl vegetable broth, 1 apple juice, and only 2 bites of jello   Plan:  Continue TPN goal 65 mL/hr at 1800, providing 100% of estimated needs  Electrolytes in TPN: increase Na 100 mEq/L, K 85 mEq/L ( =133 mEq), Ca 5 mEq/L, Mg 15 mEq/L (Max), Phos 12 mmol/L. UY:QIHK max Cl Give KCl IV 60 mEq + PO 40 mEq Mg 4 g IV x1 Add standard MVI and trace elements to TPN  Monitor TPN labs on Mon/Thurs, and PRN GI recommended liquid diet for 24-48h on 5/16 F/u po intake and wean TPN as able  Thank you for involving pharmacy in this patient's care.  Caroline Cinnamon, PharmD, BCPS Clinical Pharmacist Clinical phone for 12/31/2023 is [phone] 12/31/2023 7:09 AM

## 2023-12-31 NOTE — Progress Notes (Signed)
 Progress Note   Patient: Stacy [name] ZOX:[id number] DOB: 09/04/1980 DOA: 12/23/2023     8 DOS: the patient was seen and examined on 12/31/2023   Brief hospital course:  43 yo female with PMH recent colon perforation s/p septic shock from peritonitis/diverticulitis requiring ex lap with right colectomy and primary ileocolonic anastomosis in March 2025.  Also PMH of chronic idiopathic constipation, chronic intestinal pseudoobstruction, chronic dysmotility, chronic orthostatic hypotension, GERD, anxiety. She had presented to the hospital after attempting to undergo EGD with GI but developed hypotension, hypoxia, and aspiration. She was rather managed by the ICU due to concern for potential septic shock and required Levophed . She improved and was transferred to TRH service on 5/13.   Assessment and Plan:   Septic shock - Secondary to aspiration pneumonia.  Initially on Levophed , subsequently weaned off.  Shock appears resolved 5/13.   Chronic hypotension - Likely contributing to patient's shock above.  midodrine  increased to 10 mg 3 times daily and restarted 0.1 mg twice daily 5/14.  Showing improvement in systolic blood pressure.  No longer dizzy.  Monitor systolic blood pressure.   Acute hypoxic respiratory failure - Able to be weaned down to room air this morning.  Continue to wean O2 as tolerated.   Aspiration pneumonia - Found to have large amount of gastric contents on EGD, subsequent aspiration with CT noting bibasilar opacities.  Continues on Unasyn  (day 6 of 7).  Leukocytosis improving.  Hypoxia resolved.   Chronic intestinal pseudoobstruction and ileus - History of chronic idiopathic constipation status post right colectomy with ileocolonic anastomosis in March 2025.  GI consulted and following closely.  NG tube in place but suction off.  Reglan  10 mg IV every 8 hours.  DC opioids.  Encourage ambulation.  Showing improvement again this morning.  Tolerated clear liquid diet x  24hr.  Multiple bowel movements.  Advance diet per GI recommendations.  NG removed 5/16.  Anticipate full liquid diet today, with soft diet tomorrow.    Iron  deficiency anemia - Status post Venofer  x 2 (5/9, 5/10).  Status post 2 units PRBCs 5/13, 1un 5/14.  Hemoglobin this morning 7.7.  Some noted BRBPR.  Suppository added.  Recheck of hemoglobin this morning stable, greater than 8.  Will recheck CBC in AM.  Transfuse if hemoglobin less than 7.   Severe protein calorie malnutrition - Secondary to above.  Currently on TPN.  Diet advanced as tolerated per GI.  Goal to wean/discontinue TPN once able to tolerate p.o. and meet nutritional goals.   GERD - PPI IV on board.   Subjective: Patient alert, sitting up in bed.  In good spirits.  Tolerating full liquid diet well.  No nausea, vomiting, abdominal pain.  No shortness of breath, fever.  Ambulating well with PT.  Motivated to advance diet to solid food.  Husband at bedside.  Physical Exam: Vitals:   12/31/23 0418 12/31/23 0754 12/31/23 0800 12/31/23 1216  BP:  93/63  101/70  Pulse:    (!) 109  Resp:    18  Temp:  97.9 F (36.6 C)  98.1 F (36.7 C)  TempSrc:  Axillary  Oral  SpO2:  98% 98% 97%  Weight: 50.3 kg     Height:        GENERAL:  Alert, pleasant, no acute distress, thin HEENT:  EOMI CARDIOVASCULAR: RRR, no murmurs appreciated RESPIRATORY:  Clear to auscultation, no wheezing, rales, or rhonchi GASTROINTESTINAL:  Soft, nontender, nondistended EXTREMITIES:  No LE edema bilaterally NEURO:  No new focal deficits appreciated SKIN:  No rashes noted PSYCH:  Appropriate mood and affect    Data Reviewed:  No new imaging to review  Labs: CBC: Recent Labs  Lab 12/27/23 1058 12/27/23 1318 12/28/23 0800 12/28/23 2006 12/29/23 0412 12/29/23 1813 12/30/23 0329 12/31/23 0307  WBC 6.2   < > 5.6  --  5.7 5.7 8.7 10.5  NEUTROABS 4.2  --   --   --   --   --   --   --   HGB 5.2*   < > 7.0* 8.0* 7.7* 8.2* 8.1* 8.2*  HCT  16.7*   < > 21.7* 25.0* 24.4* 25.7* 25.6* 25.6*  MCV 86.5   < > 85.1  --  86.2 87.1 87.1 88.0  PLT 357   < > 255  --  269 238 294 313   < > = values in this interval not displayed.   Basic Metabolic Panel: Recent Labs  Lab 12/27/23 1057 12/28/23 0432 12/29/23 0412 12/30/23 0329 12/31/23 0307  NA 136  137 137 138 136  136 135  135  K 3.3*  3.4* 3.9 3.2* 3.4*  3.4* 3.1*  3.1*  CL 100  101 101 100 101  101 100  99  CO2 30  30 32 31 29  29 28  28   GLUCOSE 117*  117* 124* 109* 131*  127* 122*  122*  BUN 10  10 8 10 9  9 8  8   CREATININE <0.30*  0.35* 0.33* 0.32* <0.30*  0.31* <0.30*  0.31*  CALCIUM  7.7*  7.7* 7.7* 7.6* 7.4*  7.4* 7.7*  7.7*  MG 1.7 2.0 1.5* 1.8 1.5*  PHOS 3.3 3.7 3.3 3.3  3.3 3.6   Liver Function Tests: Recent Labs  Lab 12/26/23 0437 12/27/23 1057 12/28/23 0432 12/29/23 0412 12/30/23 0329 12/31/23 0307  AST 11*  --   --  26  --   --   ALT 16  --   --  27  --   --   ALKPHOS 58  --   --  60  --   --   BILITOT 0.8  --   --  0.6  --   --   PROT 4.3*  --   --  4.4*  --   --   ALBUMIN  <1.5* <1.5* <1.5* <1.5* <1.5* <1.5*   CBG: Recent Labs  Lab 12/26/23 0345 12/26/23 0746 12/26/23 1148 12/26/23 1624 12/26/23 1917  GLUCAP 136* 119* 151* 128* 101*    Scheduled Meds:  (feeding supplement) PROSource Plus  30 mL Oral BID BM   Chlorhexidine  Gluconate Cloth  6 each Topical Daily   feeding supplement  1 Container Oral TID BM   fludrocortisone   0.1 mg Oral BID   heparin  injection (subcutaneous)  5,000 Units Subcutaneous Q8H   hydrocortisone   25 mg Rectal BID   metoCLOPramide  (REGLAN ) injection  10 mg Intravenous Q8H   midodrine   10 mg Oral TID WC   nystatin   5 mL Oral QID   pantoprazole  (PROTONIX ) IV  40 mg Intravenous Q12H   polyethylene glycol  17 g Oral BID   simethicone   80 mg Oral QID   sodium chloride  flush  10-40 mL Intracatheter Q12H   Continuous Infusions:  ampicillin -sulbactam (UNASYN ) IV 3 g (12/31/23 1109)    potassium chloride  10 mEq (12/31/23 1208)   TPN ADULT (ION) 65 mL/hr at 12/31/23 0431   TPN ADULT (ION)     PRN Meds:.acetaminophen , alum & mag  hydroxide-simeth, docusate sodium , ipratropium-albuterol , melatonin, ondansetron  (ZOFRAN ) IV, mouth rinse, phenol, sodium chloride  flush, white petrolatum   Family Communication: Husband at bedside  Disposition: Status is: Inpatient Remains inpatient appropriate because: Ileus on TPN     Time spent: 33 minutes  Author: Jodeane Mulligan, DO 12/31/2023 12:55 PM  For on call review [email or web address].

## 2023-12-31 NOTE — Progress Notes (Signed)
 Mobility Specialist: Progress Note   12/31/23 1235  Orthostatic Standing at 0 minutes  BP- Standing at 0 minutes 95/68  Pulse- Standing at 0 minutes 115  Mobility  Activity Ambulated with assistance in hallway  Level of Assistance Standby assist, set-up cues, supervision of patient - no hands on  Assistive Device Four wheel walker  Distance Ambulated (ft) 450 ft  Activity Response Tolerated well  Mobility Referral Yes  Mobility visit 1 Mobility  Mobility Specialist Start Time (ACUTE ONLY) 0910  Mobility Specialist Stop Time (ACUTE ONLY) 0935  Mobility Specialist Time Calculation (min) (ACUTE ONLY) 25 min    During Mobility (EOB): SpO2 93% 1LO2, HR 103 During Mobility (Ambulation): SpO2 90-93% 1LO2, HR 115-124  Pt was agreeable to mobility session - received in bed. Husband present and helpful throughout. MS donned compression socks while pt in supine. SV throughout. BP 95/68 upon initial stand, pt w/o complaints and husband stated this is her baseline BP. Ambulated the hallway and back without complaints. +2 for safety/line management with husband assisting. SpO2 WFL on 1LO2. Returned to room without fault. Left in bed with all needs met, call bell in reach.   Deloria Fetch Mobility Specialist Please contact via SecureChat or Rehab office at [phone]

## 2024-01-01 DIAGNOSIS — E876 Hypokalemia: Secondary | ICD-10-CM | POA: Diagnosis not present

## 2024-01-01 DIAGNOSIS — J9601 Acute respiratory failure with hypoxia: Secondary | ICD-10-CM | POA: Diagnosis not present

## 2024-01-01 DIAGNOSIS — J690 Pneumonitis due to inhalation of food and vomit: Secondary | ICD-10-CM | POA: Diagnosis not present

## 2024-01-01 DIAGNOSIS — K5904 Chronic idiopathic constipation: Secondary | ICD-10-CM | POA: Diagnosis not present

## 2024-01-01 LAB — BASIC METABOLIC PANEL WITH GFR
Anion gap: 8 (ref 5–15)
BUN: 9 mg/dL (ref 6–20)
CO2: 26 mmol/L (ref 22–32)
Calcium: 7.8 mg/dL — ABNORMAL LOW (ref 8.9–10.3)
Chloride: 101 mmol/L (ref 98–111)
Creatinine, Ser: 0.33 mg/dL — ABNORMAL LOW (ref 0.44–1.00)
GFR, Estimated: >60 mL/min (ref >60)
Glucose, Bld: 120 mg/dL — ABNORMAL HIGH (ref 70–99)
Potassium: 3.3 mmol/L — ABNORMAL LOW (ref 3.5–5.1)
Sodium: 135 mmol/L (ref 135–145)

## 2024-01-01 LAB — MAGNESIUM: Magnesium: 1.7 mg/dL (ref 1.7–2.4)

## 2024-01-01 MED ORDER — MIDODRINE HCL 5 MG PO TABS
5.0000 mg | ORAL_TABLET | Freq: Three times a day (TID) | ORAL | Status: DC
  Administered 2024-01-01 – 2024-01-03 (×5): 5 mg via ORAL
  Filled 2024-01-01 (×5): qty 1

## 2024-01-01 MED ORDER — MAGNESIUM SULFATE 2 GM/50ML IV SOLN
2.0000 g | Freq: Once | INTRAVENOUS | Status: AC
Start: 2024-01-01 — End: 2024-01-01
  Administered 2024-01-01: 2 g via INTRAVENOUS
  Filled 2024-01-01: qty 50

## 2024-01-01 MED ORDER — POTASSIUM CHLORIDE CRYS ER 20 MEQ PO TBCR
40.0000 meq | EXTENDED_RELEASE_TABLET | ORAL | Status: AC
  Administered 2024-01-01 (×2): 40 meq via ORAL
  Filled 2024-01-01 (×2): qty 2

## 2024-01-01 MED ORDER — TRAVASOL 10 % IV SOLN
INTRAVENOUS | Status: AC
  Filled 2024-01-01: qty 850.2

## 2024-01-01 NOTE — Progress Notes (Signed)
 [facility] Gastroenterology Progress Note  Stacy [name] y.o. [birth year]   Subjective: Tolerating full liquids. Loose stools yesterday. Denies abdominal pain/N/V. Intermittent belching.   Objective: Vital signs: Vitals:   01/01/24 0823 01/01/24 1035  BP: 93/63 (!) 135/102  Pulse: (!) 114 97  Resp: 18 18  Temp: 98.5 F (36.9 C) 98 F (36.7 C)  SpO2: 95% 96%    Physical Exam: Gen: alert, thin, no acute distress, pleasant HEENT: anicteric sclera CV: RRR Chest: CTA B Abd: soft, nontender, nondistended, +BS, midline surgical incision healing Ext: no edema  Lab Results: Recent Labs    12/30/23 0329 12/31/23 0307 01/01/24 0258  NA 136  136 135  135 135  K 3.4*  3.4* 3.1*  3.1* 3.3*  CL 101  101 100  99 101  CO2 29  29 28  28 26   GLUCOSE 131*  127* 122*  122* 120*  BUN 9  9 8  8 9   CREATININE <0.30*  0.31* <0.30*  0.31* 0.33*  CALCIUM  7.4*  7.4* 7.7*  7.7* 7.8*  MG 1.8 1.5* 1.7  PHOS 3.3  3.3 3.6  --    Recent Labs    12/30/23 0329 12/31/23 0307  ALBUMIN  <1.5* <1.5*   Recent Labs    12/30/23 0329 12/31/23 0307  WBC 8.7 10.5  HGB 8.1* 8.2*  HCT 25.6* 25.6*  MCV 87.1 88.0  PLT 294 313      Assessment/Plan: Chronic intestinal pseudo-obstruction with question of aspiration pneumonia. Recent right colectomy (3/25). Tolerating full liquids. Changed diet to soft. Continue TPN and will need TPN as outpt temporarily since doubt will be able to sustain herself with oral intake for the forseeable future. Supportive care. Will need to go home on PPI PO BID. She wants to go home tomorrow and hope the TPN can be arranged for that. Hospitalist will arrange outpt TPN.   Stacy [name] 01/01/2024, 11:43 AM  Questions please call [phone] ID: Stacy [name], female   DOB: November 22, 1980, 43 y.o.   MRN: [id number]

## 2024-01-01 NOTE — Progress Notes (Signed)
 Mobility Specialist: Progress Note   01/01/24 1518  Orthostatic Standing at 3 minutes  BP- Standing at 3 minutes 96/65  Pulse- Standing at 3 minutes 76  Mobility  Activity Ambulated with assistance in hallway  Level of Assistance Standby assist, set-up cues, supervision of patient - no hands on  Assistive Device Four wheel walker  Distance Ambulated (ft) 450 ft  Activity Response Tolerated well  Mobility Referral Yes  Mobility visit 1 Mobility  Mobility Specialist Start Time (ACUTE ONLY) 0850  Mobility Specialist Stop Time (ACUTE ONLY) 0900  Mobility Specialist Time Calculation (min) (ACUTE ONLY) 10 min    Pt received ambulating from BR, very pleasant and eager for mobility. SV throughout. No complaints. SpO2 WFL on RA. Returned to room without fault. Left in bed with all needs met, call bell in reach.   Deloria Fetch Mobility Specialist Please contact via SecureChat or Rehab office at [phone]

## 2024-01-01 NOTE — Progress Notes (Signed)
 Progress Note   Patient: Stacy [name] ZOX:[id number] DOB: 07/01/1981 DOA: 12/23/2023     9 DOS: the patient was seen and examined on 01/01/2024   Brief hospital course:  43 yo female with PMH recent colon perforation s/p septic shock from peritonitis/diverticulitis requiring ex lap with right colectomy and primary ileocolonic anastomosis in March 2025.  Also PMH of chronic idiopathic constipation, chronic intestinal pseudoobstruction, chronic dysmotility, chronic orthostatic hypotension, GERD, anxiety. She had presented to the hospital after attempting to undergo EGD with GI but developed hypotension, hypoxia, and aspiration. She was rather managed by the ICU due to concern for potential septic shock and required Levophed . She improved and was transferred to TRH service on 5/13.   Assessment and Plan:   Septic shock - Secondary to aspiration pneumonia.  Initially on Levophed , subsequently weaned off.  Shock appears resolved 5/13.   Chronic hypotension - Likely contributing to patient's shock above.  midodrine  increased to 10 mg 3 times daily and restarted 0.1 mg twice daily 5/14.  Showing improvement in systolic blood pressure.  No longer dizzy.  Monitor systolic blood pressure.  Will wean midodrine  to 5mg  TID.     Acute hypoxic respiratory failure - Able to be weaned down to room air this morning.  Appears resolved.    Aspiration pneumonia - Found to have large amount of gastric contents on EGD, subsequent aspiration with CT noting bibasilar opacities.  Continues on Unasyn  (day 7 of 7).  Leukocytosis resolved.  Hypoxia resolved.   Chronic intestinal pseudoobstruction and ileus - History of chronic idiopathic constipation status post right colectomy with ileocolonic anastomosis in March 2025.  GI consulted and following closely.  NG tube in place but suction off.  Reglan  10 mg IV every 8 hours.  DC opioids.  Encourage ambulation.  Showing improvement again this morning.  Tolerated full  liquid diet x 24hr.  Multiple bowel movements.  Advance diet per GI recommendations.  NG removed 5/16.  Anticipate soft diet today.  Unclear at this time if patient will be able to meet her calorie requirements fully PO, so may need TPN ongoing.    Iron  deficiency anemia - Status post Venofer  x 2 (5/9, 5/10).  Status post 2 units PRBCs 5/13, 1un 5/14.  Hemoglobin this morning 7.7.  Some noted BRBPR.  Suppository added.  hemoglobin stable, greater than 8.  Will recheck CBC in AM.  Transfuse if hemoglobin less than 7.   Severe protein calorie malnutrition - Secondary to above.  Currently on TPN.  Diet advanced as tolerated per GI.  Goal to wean/discontinue TPN once able to tolerate p.o. and meet nutritional goals. Unclear at this time if patient will be able to meet her calorie requirements fully PO, so may need TPN ongoing.  Will work with case management and family to possible do home TPN.    GERD - PPI IV on board.   Subjective:  Patient observed walking the halls.  In good spirits.  Tolerating full liquid diet well.  No nausea, vomiting, abdominal pain.  No shortness of breath, fever.  Ambulating well with PT.  Motivated to advance diet to solid food.  Husband at bedside.   Physical Exam: Vitals:   01/01/24 0127 01/01/24 0529 01/01/24 0823 01/01/24 1035  BP:  (!) 119/91 93/63 (!) 135/102  Pulse:  (!) 106 (!) 114 97  Resp:  16 18 18   Temp:  97.7 F (36.5 C) 98.5 F (36.9 C) 98 F (36.7 C)  TempSrc:  Oral Oral  Oral  SpO2:  91% 95% 96%  Weight: 50.5 kg     Height:        GENERAL:  Alert, pleasant, no acute distress, thin HEENT:  EOMI CARDIOVASCULAR: RRR, no murmurs appreciated RESPIRATORY:  Clear to auscultation, no wheezing, rales, or rhonchi GASTROINTESTINAL:  Soft, nontender, nondistended EXTREMITIES:  No LE edema bilaterally NEURO:  No new focal deficits appreciated SKIN:  No rashes noted PSYCH:  Appropriate mood and affect    Data Reviewed:  No new imaging to  review  Labs: CBC: Recent Labs  Lab 12/27/23 1058 12/27/23 1318 12/28/23 0800 12/28/23 2006 12/29/23 0412 12/29/23 1813 12/30/23 0329 12/31/23 0307  WBC 6.2   < > 5.6  --  5.7 5.7 8.7 10.5  NEUTROABS 4.2  --   --   --   --   --   --   --   HGB 5.2*   < > 7.0* 8.0* 7.7* 8.2* 8.1* 8.2*  HCT 16.7*   < > 21.7* 25.0* 24.4* 25.7* 25.6* 25.6*  MCV 86.5   < > 85.1  --  86.2 87.1 87.1 88.0  PLT 357   < > 255  --  269 238 294 313   < > = values in this interval not displayed.   Basic Metabolic Panel: Recent Labs  Lab 12/27/23 1057 12/28/23 0432 12/29/23 0412 12/30/23 0329 12/31/23 0307 01/01/24 0258  NA 136  137 137 138 136  136 135  135 135  K 3.3*  3.4* 3.9 3.2* 3.4*  3.4* 3.1*  3.1* 3.3*  CL 100  101 101 100 101  101 100  99 101  CO2 30  30 32 31 29  29 28  28 26   GLUCOSE 117*  117* 124* 109* 131*  127* 122*  122* 120*  BUN 10  10 8 10 9  9 8  8 9   CREATININE <0.30*  0.35* 0.33* 0.32* <0.30*  0.31* <0.30*  0.31* 0.33*  CALCIUM  7.7*  7.7* 7.7* 7.6* 7.4*  7.4* 7.7*  7.7* 7.8*  MG 1.7 2.0 1.5* 1.8 1.5* 1.7  PHOS 3.3 3.7 3.3 3.3  3.3 3.6  --    Liver Function Tests: Recent Labs  Lab 12/26/23 0437 12/27/23 1057 12/28/23 0432 12/29/23 0412 12/30/23 0329 12/31/23 0307  AST 11*  --   --  26  --   --   ALT 16  --   --  27  --   --   ALKPHOS 58  --   --  60  --   --   BILITOT 0.8  --   --  0.6  --   --   PROT 4.3*  --   --  4.4*  --   --   ALBUMIN  <1.5* <1.5* <1.5* <1.5* <1.5* <1.5*   CBG: Recent Labs  Lab 12/26/23 0345 12/26/23 0746 12/26/23 1148 12/26/23 1624 12/26/23 1917  GLUCAP 136* 119* 151* 128* 101*    Scheduled Meds:  (feeding supplement) PROSource Plus  30 mL Oral BID BM   Chlorhexidine  Gluconate Cloth  6 each Topical Daily   feeding supplement  1 Container Oral TID BM   fludrocortisone   0.1 mg Oral BID   heparin  injection (subcutaneous)  5,000 Units Subcutaneous Q8H   hydrocortisone   25 mg Rectal BID   metoCLOPramide   (REGLAN ) injection  10 mg Intravenous Q8H   midodrine   10 mg Oral TID WC   nystatin   5 mL Oral QID   pantoprazole  (PROTONIX ) IV  40 mg Intravenous Q12H   polyethylene glycol  17 g Oral BID   simethicone   80 mg Oral QID   sodium chloride  flush  10-40 mL Intracatheter Q12H   Continuous Infusions:  ampicillin -sulbactam (UNASYN ) IV 3 g (01/01/24 1218)   TPN ADULT (ION) 65 mL/hr at 12/31/23 1727   TPN ADULT (ION)     PRN Meds:.acetaminophen , alum & mag hydroxide-simeth, docusate sodium , ipratropium-albuterol , melatonin, ondansetron  (ZOFRAN ) IV, mouth rinse, phenol, sodium chloride  flush, white petrolatum   Family Communication: Husband at bedside  Disposition: Status is: Inpatient Remains inpatient appropriate because: ileus     Time spent: 35 minutes  Author: Jodeane Mulligan, DO 01/01/2024 12:38 PM  For on call review [email or web address].

## 2024-01-01 NOTE — Plan of Care (Signed)
 Patient AAOx4. Telemetry monitoring in progress. TPN running via LUA PICC. No complaints of pain. Ambulated in hallway with mobility and husband today. Diet advanced to soft, tolerated lunch and dinner well. Safety precautions maintained.     Problem: Health Behavior/Discharge Planning: Goal: Ability to manage health-related needs will improve Outcome: Progressing   Problem: Clinical Measurements: Goal: Ability to maintain clinical measurements within normal limits will improve Outcome: Progressing   Problem: Activity: Goal: Risk for activity intolerance will decrease Outcome: Progressing   Problem: Nutrition: Goal: Adequate nutrition will be maintained Outcome: Progressing   Problem: Elimination: Goal: Will not experience complications related to bowel motility Outcome: Progressing   Problem: Pain Managment: Goal: General experience of comfort will improve and/or be controlled Outcome: Progressing

## 2024-01-01 NOTE — Plan of Care (Signed)

## 2024-01-01 NOTE — Progress Notes (Signed)
 PHARMACY - TOTAL PARENTERAL NUTRITION CONSULT NOTE   Indication: Prolonged ileus  Patient Measurements: Height: 5\' 3"  (160 cm) Weight: 50.5 kg (111 lb 5.3 oz) IBW/kg (Calculated) : 52.4 TPN AdjBW (KG): 43.8 Body mass index is 19.72 kg/m. Usual Weight: ~110 lbs  Assessment:  43 yo W with nausea and vomiting for 2 weeks with at least 12 lbs weight loss due to poor PO intake. In March 2025, patient had colon perforation, pericolic abscess, diverticulitis, peritonitis, and septic shock s/p ex lap and right colectomy followed by ileocolic anastomosis and cholecystectomy.  Patient required TPN 3/18- 3/31 due to post-op ileus. CT on 4/29 was suspicious for ileus. Patient had an outpatient EGD 12/23/23 complicated by aspiration and hypotension. Pharmacy consulted for TPN.  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   Glucose / Insulin : CBG <140,  off SSI Electrolytes:  K 3.3 (Received 60 mEq IV + 40 mEq PO),  CoCa 9.8, Mg 1.7 (max in TPN, s/p 4g), others wnl Renal: Scr 0.33 (CL 0.4 - 0.6), BUN wnl Hepatic: albumin  <1.5, alk phos/AST/ALT/Tbili wnl Intake / Output; MIVF: UOP x4 charted, NGT removed 5/16, LBM 5/16 x2 (types 5, 7) Reglan  5/9 >> Miralax , Enema 5/11, 5/12  GI imaging: 4/29 CT: Mild-to-moderate diffuse small bowel dilatation, without definite transition point or significant interval change, suspicious for ileus. 5/9 KUB: dilated small bowel  5/11 CT: Persistent esophagitis and enterocolitis as well as ileus.  GI procedures:  5/13 NGT   Central access: CVC 5/9  TPN start date: 5/10   Nutritional Goals: Goal TPN rate is 65 mL/hr provides 85 g of protein and 1623 kcals per day  RD Assessment: last updated 5/13 Estimated Needs Total Energy Estimated Needs: 1600-1800 kcal Total Protein Estimated Needs: 80-100 gm Total Fluid Estimated Needs: >1.6L  Current Nutrition:   TPN 5/12 Clamp NGT, CLD > FLD- ate most of 1 bowl of grits and half an ice cream 5/13: FLD - ate most of 1 bowl of grits, broth, 1/3 of an ice cream, cranberry juice, some Boost breeze; was interrupted by PT for lunch, does not like milk-based supplements 5/14 GI recommends NPO due to worsening N/V/abdominal distention 5/15 CLD: had 1 bowl vegetable broth, 1 apple juice, and only 2 bites of jello 5/17 FLD: ate all cream of wheat, grits   Plan:  Continue TPN goal 65 mL/hr at 1800, providing 100% of estimated needs  Electrolytes in TPN: increase Na 100 mEq/L, K 85 mEq/L ( =133 mEq), Ca 5 mEq/L, Mg 15 mEq/L (Max), Phos 12 mmol/L. WJ:XBJY max Cl Give KCl PO 80 mEq Mg 2 g IV x1 Add standard MVI and trace elements to TPN  Monitor TPN labs on Mon/Thurs, and PRN Plan is for soft diet and likely will need TPN for another 2 weeks per Dr Honey Lusty  Thank you for involving pharmacy in this patient's care.  Caroline Cinnamon, PharmD, BCPS Clinical Pharmacist Clinical phone for 01/01/2024 is [phone] 01/01/2024 7:00 AM

## 2024-01-02 ENCOUNTER — Other Ambulatory Visit: Payer: Self-pay

## 2024-01-02 DIAGNOSIS — D509 Iron deficiency anemia, unspecified: Secondary | ICD-10-CM | POA: Diagnosis not present

## 2024-01-02 DIAGNOSIS — K5904 Chronic idiopathic constipation: Secondary | ICD-10-CM | POA: Diagnosis not present

## 2024-01-02 DIAGNOSIS — E43 Unspecified severe protein-calorie malnutrition: Secondary | ICD-10-CM | POA: Diagnosis not present

## 2024-01-02 LAB — COMPREHENSIVE METABOLIC PANEL WITH GFR
ALT: 72 U/L — ABNORMAL HIGH (ref 0–44)
AST: 27 U/L (ref 15–41)
Albumin: <1.5 g/dL — ABNORMAL LOW (ref 3.5–5.0)
Alkaline Phosphatase: 82 U/L (ref 38–126)
Anion gap: 5 (ref 5–15)
BUN: 10 mg/dL (ref 6–20)
CO2: 27 mmol/L (ref 22–32)
Calcium: 7.8 mg/dL — ABNORMAL LOW (ref 8.9–10.3)
Chloride: 104 mmol/L (ref 98–111)
Creatinine, Ser: 0.35 mg/dL — ABNORMAL LOW (ref 0.44–1.00)
GFR, Estimated: >60 mL/min (ref >60)
Glucose, Bld: 121 mg/dL — ABNORMAL HIGH (ref 70–99)
Potassium: 3.3 mmol/L — ABNORMAL LOW (ref 3.5–5.1)
Sodium: 136 mmol/L (ref 135–145)
Total Bilirubin: 0.4 mg/dL (ref 0.0–1.2)
Total Protein: 5 g/dL — ABNORMAL LOW (ref 6.5–8.1)

## 2024-01-02 LAB — CBC
HCT: 24.3 % — ABNORMAL LOW (ref 36.0–46.0)
Hemoglobin: 7.3 g/dL — ABNORMAL LOW (ref 12.0–15.0)
MCH: 27.1 pg (ref 26.0–34.0)
MCHC: 30 g/dL (ref 30.0–36.0)
MCV: 90.3 fL (ref 80.0–100.0)
Platelets: 401 10*3/uL — ABNORMAL HIGH (ref 150–400)
RBC: 2.69 MIL/uL — ABNORMAL LOW (ref 3.87–5.11)
RDW: 16.4 % — ABNORMAL HIGH (ref 11.5–15.5)
WBC: 13 10*3/uL — ABNORMAL HIGH (ref 4.0–10.5)
nRBC: 0 % (ref 0.0–0.2)

## 2024-01-02 LAB — TRIGLYCERIDES: Triglycerides: 105 mg/dL (ref <150)

## 2024-01-02 LAB — MAGNESIUM: Magnesium: 1.7 mg/dL (ref 1.7–2.4)

## 2024-01-02 LAB — PHOSPHORUS: Phosphorus: 3.4 mg/dL (ref 2.5–4.6)

## 2024-01-02 MED ORDER — MAGNESIUM SULFATE 4 GM/100ML IV SOLN
4.0000 g | Freq: Once | INTRAVENOUS | Status: AC
  Administered 2024-01-02: 4 g via INTRAVENOUS
  Filled 2024-01-02: qty 100

## 2024-01-02 MED ORDER — POTASSIUM CHLORIDE 10 MEQ/50ML IV SOLN
10.0000 meq | INTRAVENOUS | Status: AC
  Administered 2024-01-02 (×6): 10 meq via INTRAVENOUS
  Filled 2024-01-02 (×6): qty 50

## 2024-01-02 MED ORDER — TRAVASOL 10 % IV SOLN
INTRAVENOUS | Status: DC
Start: 2024-01-02 — End: 2024-01-03
  Filled 2024-01-02: qty 850.2

## 2024-01-02 MED ORDER — PANTOPRAZOLE SODIUM 40 MG PO TBEC
40.0000 mg | DELAYED_RELEASE_TABLET | Freq: Two times a day (BID) | ORAL | Status: DC
  Administered 2024-01-02 (×2): 40 mg via ORAL
  Filled 2024-01-02 (×2): qty 1

## 2024-01-02 NOTE — Plan of Care (Signed)

## 2024-01-02 NOTE — Progress Notes (Signed)
 Progress Note   Patient: Stacy [name] ONG:[id number] DOB: [birth year] DOA: 12/23/2023     10 DOS: the patient was seen and examined on 01/02/2024   Brief hospital course:  43 yo female with PMH recent colon perforation s/p septic shock from peritonitis/diverticulitis requiring ex lap with right colectomy and primary ileocolonic anastomosis in March 2025.  Also PMH of chronic idiopathic constipation, chronic intestinal pseudoobstruction, chronic dysmotility, chronic orthostatic hypotension, GERD, anxiety. She had presented to the hospital after attempting to undergo EGD with GI but developed hypotension, hypoxia, and aspiration. She was rather managed by the ICU due to concern for potential septic shock and required Levophed . She improved and was transferred to TRH service on 5/13.   Assessment and Plan:   Septic shock - Secondary to aspiration pneumonia.  Initially on Levophed , subsequently weaned off.  Shock appears resolved 5/13.   Chronic hypotension - Likely contributing to patient's shock above.  midodrine  increased to 10 mg 3 times daily and restarted 0.1 mg twice daily 5/14.  Showing improvement in systolic blood pressure.  No longer dizzy.  Monitor systolic blood pressure.  Weaned midodrine  to 5mg  TID 5/18.  Appears to be tolerating well.   Acute hypoxic respiratory failure - Breathing comfortably on room air.  Appears resolved.    Aspiration pneumonia - Found to have large amount of gastric contents on EGD, subsequent aspiration with CT noting bibasilar opacities.  Continues on Unasyn  (day 7 of 7).  Leukocytosis resolved.  Hypoxia resolved.   Chronic intestinal pseudoobstruction and ileus - History of chronic idiopathic constipation status post right colectomy with ileocolonic anastomosis in March 2025.  GI consulted and following closely.  Reglan  10 mg IV every 8 hours.  DC opioids.  Encourage ambulation.  Showing improvement again this morning.  Tolerated advancement in diet.   NG removed 5/16.  Unlikely patient will be able to meet her calorie requirements fully PO, so arranging for patient to have TPN at discharge.  Patient to have triple-lumen PICC swapped to double-lumen (per home health request).  Working closely with pharmacy and TOC on TPN and home health.   Iron  deficiency anemia - Status post Venofer  x 2 (5/9, 5/10).  Status post 2 units PRBCs 5/13, 1un 5/14.  Hemoglobin this morning 7.3.  Resolved BRBPR.  Suppository on board.  hemoglobin stable.  Will recheck CBC in AM.  Transfuse if hemoglobin less than 7.   Severe protein calorie malnutrition - Unlikely patient will be able to meet her calorie requirements fully PO, so arranging for patient to have TPN at discharge.  Patient to have triple-lumen PICC swapped to double-lumen (per home health request).  Working closely with pharmacy and TOC on TPN and home health.   GERD - PPI IV on board.   Subjective: Patient sitting in bed, husband at bedside.  Both in good spirits.  Patient tolerating soft foods well.  No nausea, vomiting, abdominal pain.  Motivated to be discharged today or tomorrow if possible.  Working closely with case management, pharmacy, GI, on disposition planning with home TPN and PICC line replacement.  Physical Exam: Vitals:   01/01/24 2154 01/02/24 0222 01/02/24 0519 01/02/24 0810  BP: (!) 143/99  123/83 125/77  Pulse: (!) 104  (!) 110 (!) 108  Resp: 18  18 17   Temp: 98.1 F (36.7 C)  98.2 F (36.8 C) 98.3 F (36.8 C)  TempSrc: Oral  Oral Oral  SpO2: 94%  94% 98%  Weight:  50.4 kg    Height:  GENERAL:  Alert, pleasant, no acute distress, thin HEENT:  EOMI CARDIOVASCULAR: RRR, no murmurs appreciated RESPIRATORY:  Clear to auscultation, no wheezing, rales, or rhonchi GASTROINTESTINAL:  Soft, nontender, nondistended EXTREMITIES:  No LE edema bilaterally NEURO:  No new focal deficits appreciated SKIN:  No rashes noted PSYCH:  Appropriate mood and affect    Data  Reviewed:  No new imaging to review  Labs: CBC: Recent Labs  Lab 12/27/23 1058 12/27/23 1318 12/29/23 0412 12/29/23 1813 12/30/23 0329 12/31/23 0307 01/02/24 0321  WBC 6.2   < > 5.7 5.7 8.7 10.5 13.0*  NEUTROABS 4.2  --   --   --   --   --   --   HGB 5.2*   < > 7.7* 8.2* 8.1* 8.2* 7.3*  HCT 16.7*   < > 24.4* 25.7* 25.6* 25.6* 24.3*  MCV 86.5   < > 86.2 87.1 87.1 88.0 90.3  PLT 357   < > 269 238 294 313 401*   < > = values in this interval not displayed.   Basic Metabolic Panel: Recent Labs  Lab 12/28/23 0432 12/29/23 0412 12/30/23 0329 12/31/23 0307 01/01/24 0258 01/02/24 0321  NA 137 138 136  136 135  135 135 136  K 3.9 3.2* 3.4*  3.4* 3.1*  3.1* 3.3* 3.3*  CL 101 100 101  101 100  99 101 104  CO2 32 31 29  29 28  28 26 27   GLUCOSE 124* 109* 131*  127* 122*  122* 120* 121*  BUN 8 10 9  9 8  8 9 10   CREATININE 0.33* 0.32* <0.30*  0.31* <0.30*  0.31* 0.33* 0.35*  CALCIUM  7.7* 7.6* 7.4*  7.4* 7.7*  7.7* 7.8* 7.8*  MG 2.0 1.5* 1.8 1.5* 1.7 1.7  PHOS 3.7 3.3 3.3  3.3 3.6  --  3.4   Liver Function Tests: Recent Labs  Lab 12/28/23 0432 12/29/23 0412 12/30/23 0329 12/31/23 0307 01/02/24 0321  AST  --  26  --   --  27  ALT  --  27  --   --  72*  ALKPHOS  --  60  --   --  82  BILITOT  --  0.6  --   --  0.4  PROT  --  4.4*  --   --  5.0*  ALBUMIN  <1.5* <1.5* <1.5* <1.5* <1.5*   CBG: Recent Labs  Lab 12/26/23 1624 12/26/23 1917  GLUCAP 128* 101*    Scheduled Meds:  (feeding supplement) PROSource Plus  30 mL Oral BID BM   Chlorhexidine  Gluconate Cloth  6 each Topical Daily   feeding supplement  1 Container Oral TID BM   fludrocortisone   0.1 mg Oral BID   heparin  injection (subcutaneous)  5,000 Units Subcutaneous Q8H   hydrocortisone   25 mg Rectal BID   metoCLOPramide  (REGLAN ) injection  10 mg Intravenous Q8H   midodrine   5 mg Oral TID WC   nystatin   5 mL Oral QID   pantoprazole   40 mg Oral BID   polyethylene glycol  17 g Oral BID    simethicone   80 mg Oral QID   sodium chloride  flush  10-40 mL Intracatheter Q12H   Continuous Infusions:  potassium chloride  10 mEq (01/02/24 1036)   TPN ADULT (ION) 65 mL/hr at 01/01/24 1816   TPN ADULT (ION)     PRN Meds:.acetaminophen , alum & mag hydroxide-simeth, docusate sodium , ipratropium-albuterol , melatonin, ondansetron  (ZOFRAN ) IV, mouth rinse, phenol, sodium chloride  flush, white petrolatum   Family Communication: Husband at bedside  Disposition: Status is: Inpatient Remains inpatient appropriate because: Disposition planning     Time spent: 40 minutes  Author: Jodeane Mulligan, DO 01/02/2024 12:35 PM  For on call review [email or web address].

## 2024-01-02 NOTE — Progress Notes (Signed)
 PHARMACY - TOTAL PARENTERAL NUTRITION CONSULT NOTE   Indication: Prolonged ileus  Patient Measurements: Height: 5\' 3"  (160 cm) Weight: 50.4 kg (111 lb 1.8 oz) IBW/kg (Calculated) : 52.4 TPN AdjBW (KG): 43.8 Body mass index is 19.68 kg/m. Usual Weight: ~110 lbs  Assessment:  43 yo W with nausea and vomiting for 2 weeks with at least 12 lbs weight loss due to poor PO intake. In March 2025, patient had colon perforation, pericolic abscess, diverticulitis, peritonitis, and septic shock s/p ex lap and right colectomy followed by ileocolic anastomosis and cholecystectomy.  Patient required TPN 3/18- 3/31 due to post-op ileus. CT on 4/29 was suspicious for ileus. Patient had an outpatient EGD 12/23/23 complicated by aspiration and hypotension. Pharmacy consulted for TPN.  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   Glucose / Insulin : CBG <140,  off SSI Electrolytes:  K 3.3 (Received 80 mEq PO),  CoCa 9.88, Mg 1.7 (max in TPN, s/p 2g), others wnl Renal: Scr 0.35 (CL 0.4 - 0.6), BUN wnl Hepatic: albumin  <1.5, alk phos wnl / AST wnl / ALT 72 / Tbili wnl Intake / Output; MIVF: UOP x2 charted, NGT removed 5/16, LBM 5/17 Reglan  5/9 >> Miralax , Enema 5/11, 5/12  GI imaging: 4/29 CT: Mild-to-moderate diffuse small bowel dilatation, without definite transition point or significant interval change, suspicious for ileus. 5/9 KUB: dilated small bowel  5/11 CT: Persistent esophagitis and enterocolitis as well as ileus.  GI procedures:  5/13 NGT   Central access: CVC 5/9  TPN start date: 5/10   Nutritional Goals: Goal TPN rate is 65 mL/hr provides 85 g of protein and 1623 kcals per day  RD Assessment: last updated 5/13 Estimated Needs Total Energy Estimated Needs: 1600-1800 kcal Total Protein Estimated Needs: 80-100 gm Total Fluid Estimated Needs: >1.6L  Current Nutrition:   TPN 5/12 Clamp NGT, CLD > FLD- ate most of 1 bowl of grits and half an ice cream 5/13: FLD - ate most of 1 bowl of grits, broth, 1/3 of an ice cream, cranberry juice, some Boost breeze; was interrupted by PT for lunch, does not like milk-based supplements 5/14 GI recommends NPO due to worsening N/V/abdominal distention 5/15 CLD: had 1 bowl vegetable broth, 1 apple juice, and only 2 bites of jello 5/17 FLD: ate all cream of wheat, grits   Plan:  Continue TPN goal 65 mL/hr at 1800, providing 100% of estimated needs  Electrolytes in TPN: increase Na 100 mEq/L, K 85 mEq/L,  Ca 5 mEq/L, Mg 15 mEq/L (Max), Phos 12 mmol/L. QM:VHQI max Cl Give KCl 10 mEq iv x6 Mg 4 g IV x1 Mag / BMET w/ AM labs 5/20 Add standard MVI and trace elements to TPN  Monitor TPN labs on Mon/Thurs, and PRN Plan is for soft diet and likely will need TPN for another 2 weeks per Dr Honey Lusty  Thank you for involving pharmacy in this patient's care.   Stacy [name] BS, PharmD, BCPS Clinical Pharmacist 01/02/2024 7:37 AM  Contact: [phone] after 3 PM  "Be curious, not judgmental..." -Rumalda Counter

## 2024-01-02 NOTE — TOC Progression Note (Addendum)
 Transition of Care [facility]) - Progression Note    Patient Details  Name: Stacy [name] MRN: [id number] Date of Birth: 11/13/1980  [facility] CM/SW Contact  Dane Dung, RN Phone Number: 01/02/2024, 11:12 AM  Clinical Narrative:    CM met with the patient and spouse at the bedside to discuss TOC needs for patient to return home with PICC line and TPN needs for home.  I called Kay Parson, RNCM with [employer] and she plans to teach the patient/spouse at the bedside today regarding TPN for home.  Insurance authorization will be started by [employer] and patient will discharge home tomorrow afternoon after insurance [employer] is received per [employer].  I called Rosanna Comment, RNCM with [phone] and she is aware for need for home TPN and same-day RN visit if possible.  HH RN order added.  I added dietitian in the conversation to help patient/spouse determine diet for home.  Patient currently has a triple lumen PICC and will need to have double lumen for home.  MD aware to consult IV team to have PICC line switched out for home use.  CM will continue to follow the patient for [facility] needs and discharge to home tomorrow.        Expected Discharge Plan and Services                                               Social Determinants of Health (SDOH) Interventions SDOH Screenings   Food Insecurity: No Food Insecurity (12/23/2023)  Housing: Low Risk  (12/23/2023)  Transportation Needs: No Transportation Needs (12/23/2023)  Utilities: Not At Risk (12/23/2023)  Alcohol Screen: Low Risk  (12/18/2023)  Depression (PHQ2-9): Low Risk  (12/22/2023)  Recent Concern: Depression (PHQ2-9) - Medium Risk (12/08/2023)  Financial Resource Strain: Low Risk  (12/18/2023)  Physical Activity: Insufficiently Active (12/18/2023)  Social Connections: Socially Integrated (12/18/2023)  Stress: No Stress Concern Present (12/18/2023)  Tobacco Use: Low Risk  (12/23/2023)    Readmission Risk  Interventions    12/27/2023    3:01 PM 11/02/2023    2:10 PM 10/30/2023    8:34 AM  Readmission Risk Prevention Plan  Transportation Screening Complete Complete Complete  PCP or Specialist Appt within 5-7 Days  Complete Complete  PCP or Specialist Appt within 3-5 Days Complete    Home Care Screening  Complete Complete  Medication Review (RN CM)  Complete Complete  HRI or Home Care Consult Complete    Social Work Consult for Recovery Care Planning/Counseling Complete    Palliative Care Screening Complete    Medication Review Oceanographer) Complete

## 2024-01-02 NOTE — Progress Notes (Signed)
 Nutrition Education Note  RD was consulted to provide diet education for patient who is planning on discharging home tomorrow. She is currently tolerating a soft diet and patient's husband requested RD consult to review home diet recommendations. Provided "Gastric Surgery Nutrition Therapy" handout from the [employer]. We discussed following a soft diet for 2-4 weeks at home. Encouraged low fat, low fiber foods; small, frequent meals; protein source with all meals and snacks; well-cooked meats and vegetables. Patient and husband were both appreciative of information provided. RD answered all questions and provided contact information for future questions or concerns.   Patient continues to receive TPN at 65 ml/h to provide 1620 kcal, 85 gm protein daily. TPN to continue at home x 2-4 weeks. She is on a soft diet, tolerating well. BM x 2 in the past 24 hours.  Patient to follow up with [facility] GI clinic as an outpatient.   Barnet Boots RD, LDN, CNSC Contact via secure chat. If unavailable, use group chat "RD Inpatient."

## 2024-01-02 NOTE — Progress Notes (Signed)
 [facility] Gastroenterology Progress Note  Stacy [name] 43 y.o. 1981-03-27   Subjective: Good spirits. Tolerating soft foods. Husband in room.  Objective: Vital signs: Vitals:   01/02/24 0519 01/02/24 0810  BP: 123/83 125/77  Pulse: (!) 110 (!) 108  Resp: 18 17  Temp: 98.2 F (36.8 C) 98.3 F (36.8 C)  SpO2: 94% 98%    Physical Exam: Gen: alert, no acute distress, thin, pleasant HEENT: anicteric sclera CV: RRR Chest: CTA B Abd: soft, nontender, nondistended, +BS, midline surgical scar Ext: no edema  Lab Results: Recent Labs    12/31/23 0307 01/01/24 0258 01/02/24 0321  NA 135  135 135 136  K 3.1*  3.1* 3.3* 3.3*  CL 100  99 101 104  CO2 28  28 26 27   GLUCOSE 122*  122* 120* 121*  BUN 8  8 9 10   CREATININE <0.30*  0.31* 0.33* 0.35*  CALCIUM  7.7*  7.7* 7.8* 7.8*  MG 1.5* 1.7 1.7  PHOS 3.6  --  3.4   Recent Labs    12/31/23 0307 01/02/24 0321  AST  --  27  ALT  --  72*  ALKPHOS  --  82  BILITOT  --  0.4  PROT  --  5.0*  ALBUMIN  <1.5* <1.5*   Recent Labs    12/31/23 0307 01/02/24 0321  WBC 10.5 13.0*  HGB 8.2* 7.3*  HCT 25.6* 24.3*  MCV 88.0 90.3  PLT 313 401*      Assessment/Plan: Chronic intestinal pseudo-obstruction with question of aspiration pneumonia. Recent right colectomy (3/25). Tolerating soft foods. Discharge on TPN for 2-4 weeks. Soft foods for next 2 weeks and then increase to solid food as tolerated. Patient going home tomorrow with TPN via PICC line. Office will call and arrange f/u with me and referral to [facility] GI motility. Will sign off. Call if questions.   Stacy [name] 01/02/2024, 12:44 PM  Questions please call [phone] ID: Stacy [name], female   DOB: Jul 05, 1981, 44 y.o.   MRN: [id number]

## 2024-01-02 NOTE — Progress Notes (Signed)
 Assume care at 0700. Pt lying in bed with eyes open, no acute distress. Pt monitored during the shift no immediate concerns noted, ongoing assessment with no changes observed at this time.

## 2024-01-02 NOTE — Progress Notes (Signed)
 Physical Therapy Treatment Patient Details Name: Stacy [name] MRN: [id number] DOB: 02/08/1981 Today's Date: 01/02/2024   History of Present Illness Pt is a 43 y/o female presenting to ED on 5/9 with severe heartburn, underwent EGD and developed n/v, hypoxia, hypotension, CT abd/pelvis with esophagitis. Pt with aspiration PNA leading to acute respiratory failure. 10/2023 wtih colon perforation, pericolic abscess, septic shock 2/2 peritonitis, s/p ex lap and R colectomy, developed postop ileus. Transfer to floor 5/12 overnight. Pt with + BRBPR 5/15. PMH includes Adrenal insufficiency with chronic orthostatic hypotension, GERD/esophagitis.    PT Comments  Pt received in supine, pleasantly agreeable to therapy session, plan to work on stair negotiation via backward step-up onto platform to simulate car transfers to elevated SUV. Pt needing mod safety cues and up to minA for stability with backward step-ups and up to CGA for transfers. Gait distance limited due to arrival of IV team to place new picc line, so pt returned to supine for this procedure. BP stable seated EOB pre/post mobility. BP 120/93 supine prior to standing HR 102 bpm and BP 115/87 HR 110 bpm sitting EOB post-exertion.    If plan is discharge home, recommend the following: A little help with walking and/or transfers;A little help with bathing/dressing/bathroom;Assistance with cooking/housework;Assist for transportation;Help with stairs or ramp for entrance   Can travel by private vehicle        Equipment Recommendations  None recommended by PT;Other (comment) (TBD, may need supplemental O2 pending progress)    Recommendations for Other Services       Precautions / Restrictions Precautions Precautions: Fall Recall of Precautions/Restrictions: Intact Restrictions Weight Bearing Restrictions Per Provider Order: No     Mobility  Bed Mobility Overal bed mobility: Needs Assistance Bed Mobility: Sit to Supine,  Rolling Rolling: Supervision, Used rails     Sit to supine: Supervision, Used rails   General bed mobility comments: HOB > 30 degrees due to TPN; pt using bed features PRN    Transfers Overall transfer level: Needs assistance Equipment used: Rolling walker (2 wheels) Transfers: Sit to/from Stand Sit to Stand: Contact guard assist, Min assist           General transfer comment: CGA to stand to/from EOB    Ambulation/Gait Ambulation/Gait assistance: Contact guard assist Gait Distance (Feet): 10 Feet Assistive device: Rolling walker (2 wheels) Gait Pattern/deviations: Step-to pattern, Trunk flexed, Step-through pattern       General Gait Details: distance limited due to arrival of IV team to place new PICC line   Stairs Stairs: Yes Stairs assistance: Min assist Stair Management: Step to pattern, Backwards, Forwards, With walker Number of Stairs: 3 General stair comments: single 7" platform in room x3 reps to simulate stepping up into higher car via running board; pt impulsive and tries to step up ~52ft before she is close enough to step to safely step up.   Wheelchair Mobility     Tilt Bed    Modified Rankin (Stroke Patients Only)       Balance Overall balance assessment: Needs assistance Sitting-balance support: Feet supported Sitting balance-Leahy Scale: Good     Standing balance support: During functional activity, Bilateral upper extremity supported Standing balance-Leahy Scale: Poor Standing balance comment: reliant on RW in standing                            Communication Communication Communication: No apparent difficulties;Other (comment) Factors Affecting Communication:  (pt tends not to report  symptoms unless prompted)  Cognition Arousal: Alert Behavior During Therapy: Impulsive, Flat affect   PT - Cognitive impairments: Safety/Judgement, Memory                       PT - Cognition Comments: Spouse present and  supportive; pt needs some repetitive reminders for safety with transfers/stair instruction. Pt with inconsistent report of compression sock use with chart review stating she work them to walk on weekend and pt stating she did not. Following commands: Intact      Cueing Cueing Techniques: Verbal cues  Exercises      General Comments        Pertinent Vitals/Pain Pain Assessment Pain Assessment: No/denies pain Pain Intervention(s): Monitored during session, Repositioned    Home Living                          Prior Function            PT Goals (current goals can now be found in the care plan section) Acute Rehab PT Goals Patient Stated Goal: return home PT Goal Formulation: With patient/family Time For Goal Achievement: 01/09/24 Progress towards PT goals: Progressing toward goals    Frequency    Min 2X/week      PT Plan      Co-evaluation              AM-PAC PT "6 Clicks" Mobility   Outcome Measure  Help needed turning from your back to your side while in a flat bed without using bedrails?: A Little Help needed moving from lying on your back to sitting on the side of a flat bed without using bedrails?: A Little Help needed moving to and from a bed to a chair (including a wheelchair)?: A Little Help needed standing up from a chair using your arms (e.g., wheelchair or bedside chair)?: A Little Help needed to walk in hospital room?: A Little Help needed climbing 3-5 steps with a railing? : A Little 6 Click Score: 18    End of Session Equipment Utilized During Treatment: Gait belt Activity Tolerance: Patient tolerated treatment well Patient left: with call bell/phone within reach;in bed;with bed alarm set;with nursing/sitter in room (IV team RN arriving) Nurse Communication: Mobility status PT Visit Diagnosis: Other abnormalities of gait and mobility (R26.89);Muscle weakness (generalized) (M62.81);Unsteadiness on feet (R26.81);Difficulty in walking,  not elsewhere classified (R26.2)     Time: 1610-9604 PT Time Calculation (min) (ACUTE ONLY): 15 min  Charges:    $Gait Training: 8-22 mins PT General Charges $$ ACUTE PT VISIT: 1 Visit                     Nelwyn Hebdon P., PTA Acute Rehabilitation Services Secure Chat Preferred 9a-5:30pm Office: [phone]    Mariel Shope [facility] 01/02/2024, 6:19 PM

## 2024-01-03 DIAGNOSIS — J690 Pneumonitis due to inhalation of food and vomit: Secondary | ICD-10-CM | POA: Diagnosis not present

## 2024-01-03 DIAGNOSIS — K5904 Chronic idiopathic constipation: Secondary | ICD-10-CM | POA: Diagnosis not present

## 2024-01-03 DIAGNOSIS — K219 Gastro-esophageal reflux disease without esophagitis: Secondary | ICD-10-CM

## 2024-01-03 DIAGNOSIS — J9601 Acute respiratory failure with hypoxia: Secondary | ICD-10-CM | POA: Diagnosis not present

## 2024-01-03 DIAGNOSIS — A419 Sepsis, unspecified organism: Secondary | ICD-10-CM | POA: Diagnosis not present

## 2024-01-03 LAB — CBC
HCT: 23.5 % — ABNORMAL LOW (ref 36.0–46.0)
Hemoglobin: 7.3 g/dL — ABNORMAL LOW (ref 12.0–15.0)
MCH: 28.1 pg (ref 26.0–34.0)
MCHC: 31.1 g/dL (ref 30.0–36.0)
MCV: 90.4 fL (ref 80.0–100.0)
Platelets: 426 10*3/uL — ABNORMAL HIGH (ref 150–400)
RBC: 2.6 MIL/uL — ABNORMAL LOW (ref 3.87–5.11)
RDW: 16.5 % — ABNORMAL HIGH (ref 11.5–15.5)
WBC: 12.2 10*3/uL — ABNORMAL HIGH (ref 4.0–10.5)
nRBC: 0 % (ref 0.0–0.2)

## 2024-01-03 LAB — BASIC METABOLIC PANEL WITH GFR
Anion gap: 7 (ref 5–15)
BUN: 17 mg/dL (ref 6–20)
CO2: 25 mmol/L (ref 22–32)
Calcium: 7.7 mg/dL — ABNORMAL LOW (ref 8.9–10.3)
Chloride: 103 mmol/L (ref 98–111)
Creatinine, Ser: 0.35 mg/dL — ABNORMAL LOW (ref 0.44–1.00)
GFR, Estimated: >60 mL/min (ref >60)
Glucose, Bld: 118 mg/dL — ABNORMAL HIGH (ref 70–99)
Potassium: 3.1 mmol/L — ABNORMAL LOW (ref 3.5–5.1)
Sodium: 135 mmol/L (ref 135–145)

## 2024-01-03 LAB — MAGNESIUM: Magnesium: 1.7 mg/dL (ref 1.7–2.4)

## 2024-01-03 MED ORDER — MAGNESIUM SULFATE 2 GM/50ML IV SOLN
2.0000 g | Freq: Once | INTRAVENOUS | Status: DC
  Filled 2024-01-03: qty 50

## 2024-01-03 MED ORDER — MAGNESIUM SULFATE 2 GM/50ML IV SOLN
INTRAVENOUS | Status: AC
  Filled 2024-01-03: qty 50

## 2024-01-03 MED ORDER — METOCLOPRAMIDE HCL 10 MG PO TABS: 10.0000 mg | ORAL_TABLET | Freq: Three times a day (TID) | ORAL | 0 refills | Status: AC

## 2024-01-03 MED ORDER — POTASSIUM CHLORIDE 10 MEQ/100ML IV SOLN
INTRAVENOUS | Status: AC
  Administered 2024-01-03: 10 meq via INTRAVENOUS
  Filled 2024-01-03: qty 400

## 2024-01-03 MED ORDER — MIDODRINE HCL 5 MG PO TABS
ORAL_TABLET | ORAL | Status: AC
  Administered 2024-01-03: 5 mg
  Filled 2024-01-03: qty 1

## 2024-01-03 MED ORDER — FLUDROCORTISONE ACETATE 0.1 MG PO TABS: 0.1000 mg | ORAL_TABLET | Freq: Two times a day (BID) | ORAL | 0 refills | Status: DC

## 2024-01-03 MED ORDER — TRAVASOL 10 % IV SOLN
INTRAVENOUS | Status: DC
  Filled 2024-01-03: qty 850.2

## 2024-01-03 MED ORDER — NYSTATIN 100000 UNIT/ML MT SUSP
OROMUCOSAL | Status: AC
  Administered 2024-01-03: 500000 [IU] via ORAL
  Filled 2024-01-03: qty 5

## 2024-01-03 MED ORDER — HEPARIN SOD (PORK) LOCK FLUSH 100 UNIT/ML IV SOLN
250.0000 [IU] | INTRAVENOUS | Status: AC | PRN
  Administered 2024-01-03 (×2): 250 [IU]

## 2024-01-03 MED ORDER — POTASSIUM CHLORIDE 10 MEQ/100ML IV SOLN
10.0000 meq | INTRAVENOUS | Status: DC
Start: 2024-01-03 — End: 2024-01-03
  Administered 2024-01-03: 10 meq via INTRAVENOUS
  Filled 2024-01-03: qty 100

## 2024-01-03 MED ORDER — SUCRALFATE 1 GM/10ML PO SUSP: 1.0000 g | Freq: Four times a day (QID) | ORAL | 0 refills | Status: DC

## 2024-01-03 MED ORDER — POTASSIUM CHLORIDE 20 MEQ PO PACK: 40.0000 meq | PACK | Freq: Once | ORAL | Status: DC

## 2024-01-03 NOTE — Progress Notes (Signed)
 Occupational Therapy Treatment Patient Details Name: Stacy [name] MRN: [id number] DOB: [birth year] Today's Date: 01/03/2024   History of present illness Pt is a 43 y/o female presenting to ED on 5/9 with severe heartburn, underwent EGD and developed n/v, hypoxia, hypotension, CT abd/pelvis with esophagitis. Pt with aspiration PNA leading to acute respiratory failure. 10/2023 wtih colon perforation, pericolic abscess, septic shock 2/2 peritonitis, s/p ex lap and R colectomy, developed postop ileus. Transfer to floor 5/12 overnight. Pt with + BRBPR 5/15. PMH includes Adrenal insufficiency with chronic orthostatic hypotension, GERD/esophagitis.   OT comments  Pt progressing well toward goals this session. Pt needing set up - supervision for LB ADL and standing grooming tasks at sink. Pt mod I for bed mobility and supervision for transfers with RW, ambulates hallway distance with supervision. Pt presenting with impairments listed below, will follow acutely. Continue to recommend HHOT at d/c.       If plan is discharge home, recommend the following:  A little help with walking and/or transfers;A lot of help with bathing/dressing/bathroom;Assistance with cooking/housework;Direct supervision/assist for financial management;Direct supervision/assist for medications management;Assist for transportation;Help with stairs or ramp for entrance   Equipment Recommendations  BSC/3in1    Recommendations for Other Services PT consult    Precautions / Restrictions Precautions Precautions: Fall Recall of Precautions/Restrictions: Intact Restrictions Weight Bearing Restrictions Per Provider Order: No       Mobility Bed Mobility Overal bed mobility: Modified Independent             General bed mobility comments: use of rails    Transfers Overall transfer level: Needs assistance Equipment used: Rolling walker (2 wheels) Transfers: Sit to/from Stand Sit to Stand: Supervision                  Balance Overall balance assessment: Needs assistance Sitting-balance support: Feet supported Sitting balance-Leahy Scale: Good     Standing balance support: During functional activity, Bilateral upper extremity supported Standing balance-Leahy Scale: Poor Standing balance comment: reliant on RW in standing                           ADL either performed or assessed with clinical judgement   ADL Overall ADL's : Needs assistance/impaired     Grooming: Set up;Oral care;Brushing hair;Standing               Lower Body Dressing: Set up;Sit to/from stand               Functional mobility during ADLs: Supervision/safety;Rolling walker (2 wheels)      Extremity/Trunk Assessment Upper Extremity Assessment Upper Extremity Assessment: Generalized weakness   Lower Extremity Assessment Lower Extremity Assessment: Defer to PT evaluation        Vision   Vision Assessment?: No apparent visual deficits   Perception Perception Perception: Not tested   Praxis Praxis Praxis: Not tested   Communication Communication Communication: No apparent difficulties   Cognition Arousal: Alert Behavior During Therapy: Flat affect                                 Following commands: Intact        Cueing   Cueing Techniques: Verbal cues  Exercises      Shoulder Instructions       General Comments VSS    Pertinent Vitals/ Pain       Pain Assessment Pain Assessment: No/denies  pain  Home Living                                          Prior Functioning/Environment              Frequency  Min 2X/week        Progress Toward Goals  OT Goals(current goals can now be found in the care plan section)  Progress towards OT goals: Progressing toward goals  Acute Rehab OT Goals Patient Stated Goal: none stated OT Goal Formulation: With patient Time For Goal Achievement: 01/09/24 Potential to Achieve Goals:  Good ADL Goals Pt Will Perform Grooming: with supervision;sitting Pt Will Perform Upper Body Dressing: with supervision;sitting Pt Will Perform Lower Body Dressing: with supervision;sit to/from stand;sitting/lateral leans Pt Will Transfer to Toilet: with supervision;ambulating;regular height toilet Pt Will Perform Tub/Shower Transfer: Tub transfer;Shower transfer;with supervision;ambulating  Plan      Co-evaluation                 AM-PAC OT "6 Clicks" Daily Activity     Outcome Measure   Help from another person eating meals?: A Little Help from another person taking care of personal grooming?: A Little Help from another person toileting, which includes using toliet, bedpan, or urinal?: A Lot Help from another person bathing (including washing, rinsing, drying)?: A Lot Help from another person to put on and taking off regular upper body clothing?: A Little Help from another person to put on and taking off regular lower body clothing?: A Lot 6 Click Score: 15    End of Session Equipment Utilized During Treatment: Gait belt;Rolling walker (2 wheels)  OT Visit Diagnosis: Unsteadiness on feet (R26.81);Other abnormalities of gait and mobility (R26.89);Muscle weakness (generalized) (M62.81)   Activity Tolerance Patient tolerated treatment well   Patient Left with call bell/phone within reach;in bed;with bed alarm set;with family/visitor present   Nurse Communication Mobility status        Time: 1027-2536 OT Time Calculation (min): 16 min  Charges: OT General Charges $OT Visit: 1 Visit OT Treatments $Self Care/Home Management : 8-22 mins  [name], OTD, OTR/L SecureChat Preferred Acute Rehab [phone]   Benedict Brain Koonce 01/03/2024, 9:45 AM

## 2024-01-03 NOTE — Progress Notes (Incomplete)
 PHARMACY - TOTAL PARENTERAL NUTRITION CONSULT NOTE   Indication: Prolonged ileus  Patient Measurements: Height: 5\' 3"  (160 cm) Weight: 50.4 kg (111 lb 1.8 oz) IBW/kg (Calculated) : 52.4 TPN AdjBW (KG): 43.8 Body mass index is 19.68 kg/m. Usual Weight: ~110 lbs  Assessment:  43 yo W with nausea and vomiting for 2 weeks with at least 12 lbs weight loss due to poor PO intake. In March 2025, patient had colon perforation, pericolic abscess, diverticulitis, peritonitis, and septic shock s/p ex lap and right colectomy followed by ileocolic anastomosis and cholecystectomy.  Patient required TPN 3/18- 3/31 due to post-op ileus. CT on 4/29 was suspicious for ileus. Patient had an outpatient EGD 12/23/23 complicated by aspiration and hypotension. Pharmacy consulted for TPN.  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   Glucose / Insulin : CBG <140,  off SSI Electrolytes:  K 3.1 (Received 60 mEq IV),  CoCa 9.88, Mg 1.7 (max in TPN, s/p 4g), others wnl Renal: Scr 0.35 (CL 0.4 - 0.6), BUN wnl (climbing) Hepatic: albumin  <1.5, alk phos wnl / AST wnl / ALT 72 / Tbili wnl Intake / Output; MIVF: UOP x1 charted, NGT removed 5/16, LBM 5/17 Reglan  5/9 >> Miralax , Enema 5/11, 5/12  GI imaging: 4/29 CT: Mild-to-moderate diffuse small bowel dilatation, without definite transition point or significant interval change, suspicious for ileus. 5/9 KUB: dilated small bowel  5/11 CT: Persistent esophagitis and enterocolitis as well as ileus.  GI procedures:  5/13 NGT   Central access: CVC 5/9  TPN start date: 5/10   Nutritional Goals: Goal TPN rate is 65 mL/hr provides 85 g of protein and 1623 kcals per day  RD Assessment: last updated 5/13 Estimated Needs Total Energy Estimated Needs: 1600-1800 kcal Total Protein Estimated Needs: 80-100 gm Total Fluid Estimated Needs: >1.6L  Current Nutrition:   TPN 5/12 Clamp NGT, CLD > FLD- ate most of 1 bowl of grits and half an ice cream 5/13: FLD - ate most of 1 bowl of grits, broth, 1/3 of an ice cream, cranberry juice, some Boost breeze; was interrupted by PT for lunch, does not like milk-based supplements 5/14 GI recommends NPO due to worsening N/V/abdominal distention 5/15 CLD: had 1 bowl vegetable broth, 1 apple juice, and only 2 bites of jello 5/17 FLD: ate all cream of wheat, grits   Plan:  Continue TPN goal 65 mL/hr at 1800, providing 100% of estimated needs  Electrolytes in TPN: increase Na 100 mEq/L, K 87 mEq/L,  Ca 5 mEq/L, Mg 15 mEq/L (Max), Phos 12 mmol/L. YN:WGNF max Cl Give KCl 10 mEq iv x4 Give KCL 40 meq po x 1 Mg 2 g IV x1 Add standard MVI and trace elements to TPN  Monitor TPN labs on Mon/Thurs, and PRN Plan is for soft diet and likely will need TPN for another 2 weeks per Dr Honey Lusty  Thank you for involving pharmacy in this patient's care.  Lovina Ruddle, PharmD, [facility] Clinical Pharmacist Please see AMION for all Pharmacists' Contact Phone Numbers 01/03/2024, 7:43 AM

## 2024-01-03 NOTE — Discharge Summary (Signed)
 Physician Discharge Summary   Patient: Stacy [name] MRN: [id number] DOB: [birth year]  Admit date:     12/23/2023  Discharge date: 01/03/24  Discharge Physician: Jodeane Mulligan   PCP: Jess Morita, MD   Recommendations at discharge:    Pt to be discharged home with home health.   If you experience worsening fever, chills, chest pain, shortness of breath, or other concerning symptoms, please call your PCP or go to the emergency department immediately.  Discharge Diagnoses: Active Problems:   Chronic idiopathic constipation   Aspiration pneumonia (HCC)   Iron  deficiency anemia   Acute respiratory failure with hypoxia (HCC)   Severe protein-calorie malnutrition (HCC)   Chronic intestinal pseudo-obstruction   Gastroesophageal reflux disease   Hypokalemia   Hypomagnesemia   Chronic orthostatic hypotension  Principal Problem (Resolved):   Septic shock [facility])   Hospital Course:  43 yo female with PMH recent colon perforation s/p septic shock from peritonitis/diverticulitis requiring ex lap with right colectomy and primary ileocolonic anastomosis in March 2025.  Also PMH of chronic idiopathic constipation, chronic intestinal pseudoobstruction, chronic dysmotility, chronic orthostatic hypotension, GERD, anxiety. She had presented to the hospital after attempting to undergo EGD with GI but developed hypotension, hypoxia, and aspiration. She was rather managed by the ICU due to concern for potential septic shock and required Levophed . She improved and was transferred to TRH service on 5/13.   Assessment and Plan:   Septic shock - Secondary to aspiration pneumonia.  Initially on Levophed , subsequently weaned off.  Shock appears resolved 5/13.   Chronic hypotension - Likely contributing to patient's shock above.  midodrine  increased to 10 mg 3 times daily and restarted fludrocortisone  0.1 mg twice daily 5/14.  Showing improvement in systolic blood pressure.  No longer dizzy.   Monitor systolic blood pressure.  Weaned midodrine  to 5mg  TID 5/18.  Appears to be tolerating well.  Will provide prescription for fludrocortisone .  Patient to resume home dose of midodrine  5 mg 3 times daily as well.   Acute hypoxic respiratory failure - Breathing comfortably on room air.  Appears resolved.    Aspiration pneumonia - Found to have large amount of gastric contents on EGD, subsequent aspiration with CT noting bibasilar opacities.  Continues on Unasyn  (day 7 of 7).  Leukocytosis resolved.  Hypoxia resolved.   Chronic intestinal pseudoobstruction and ileus - History of chronic idiopathic constipation status post right colectomy with ileocolonic anastomosis in March 2025.  GI consulted and following closely.  Reglan  10 mg IV every 8 hours.  DC opioids.  Encourage ambulation.  Showing improvement again this morning.  Tolerated advancement in diet.  NG removed 5/16.  Unlikely patient will be able to meet her calorie requirements fully PO, so arranging for patient to have TPN at discharge.  Patient to have triple-lumen PICC swapped to double-lumen (per home health request).  Working closely with pharmacy and TOC on TPN and home health.  Per GI, follow-up with outpatient providers including those in [facility].  Prescription for p.o. Reglan  provided as well.  Would confer closely with outpatient GI on medication regiment.   Iron  deficiency anemia - Status post Venofer  x 2 (5/9, 5/10).  Status post 2 units PRBCs 5/13, 1un 5/14.  Hemoglobin this morning 7.3.  Resolved BRBPR.  Suppository on board.  hemoglobin stable.  Recommend follow-up with primary care provider this week to recheck hemoglobin.  Patient history of following with hematology in the outpatient setting when hemoglobin to get low normal to get low  for iron  infusions.   Severe protein calorie malnutrition - Unlikely patient will be able to meet her calorie requirements fully PO, so arranging for patient to have TPN at discharge.  Patient  to have triple-lumen PICC swapped to double-lumen (per home health request).  TPN to be provided for home.  Patient's husband has been educated on administration practices.    GERD - Continue Protonix .  Physical debilitation muscle weakness - Secondary to protein calorie malnutrition and prolonged hospital stay.  PT/OT on board.  Recommending home health PT/OT.   Consultants: Gastroenterology Procedures performed: None Disposition: Home health Diet recommendation:  Discharge Diet Orders (From admission, onward)     Start     Ordered   01/03/24 0000  Diet - low sodium heart healthy        01/03/24 1028           Soft diet  DISCHARGE MEDICATION: Allergies as of 01/03/2024       Reactions   Tetracyclines & Related Swelling   Swelling in spine; required spinal tap.   Erythromycin Other (See Comments)   Swelling in spine; required spinal tap   Raspberry Rash   Sulfa Antibiotics Rash   hives   Sulfonamide Derivatives Rash   Reaction to cream        Medication List     STOP taking these medications    famotidine  40 MG/5ML suspension Commonly known as: PEPCID    prochlorperazine 25 MG suppository Commonly known as: COMPAZINE       TAKE these medications    acetaminophen  500 MG tablet Commonly known as: TYLENOL  Take 2 tablets (1,000 mg total) by mouth every 6 (six) hours as needed for mild pain (pain score 1-3), fever or headache.   cyanocobalamin  1000 MCG/ML injection Commonly known as: VITAMIN B12 Inject 1,000 mcg into the muscle every 30 (thirty) days.   fludrocortisone  0.1 MG tablet Commonly known as: FLORINEF  Take 1 tablet (0.1 mg total) by mouth 2 (two) times daily. What changed: See the new instructions.   megestrol  625 MG/5ML suspension Commonly known as: MEGACE  ES SHAKE LIQUID AND TAKE 5 ML(625 MG) BY MOUTH DAILY   metoCLOPramide  10 MG tablet Commonly known as: REGLAN  Take 1 tablet (10 mg total) by mouth 3 (three) times daily with meals.    midodrine  5 MG tablet Commonly known as: PROAMATINE  Take 1 tablet (5 mg total) by mouth 3 (three) times daily with meals.   montelukast  10 MG tablet Commonly known as: SINGULAIR  Take 1 tablet (10 mg total) by mouth daily.   multivitamin with minerals Tabs tablet Take 1 tablet by mouth daily. What changed: when to take this   ondansetron  4 MG disintegrating tablet Commonly known as: ZOFRAN -ODT Take 1 tablet (4 mg total) by mouth every 8 (eight) hours as needed for nausea or vomiting.   pantoprazole  40 MG tablet Commonly known as: Protonix  Take 1 tablet (40 mg total) by mouth daily. What changed: when to take this   polyethylene glycol 17 g packet Commonly known as: MIRALAX  / GLYCOLAX  Take 17 g by mouth 3 (three) times daily. What changed:  when to take this reasons to take this   potassium chloride  SA 20 MEQ tablet Commonly known as: KLOR-CON  M Take 1 tablet (20 mEq total) by mouth daily.   sucralfate  1 GM/10ML suspension Commonly known as: Carafate  Take 10 mLs (1 g total) by mouth 4 (four) times daily.   traZODone  50 MG tablet Commonly known as: DESYREL  Take 0.5-1 tablets (25-50  mg total) by mouth at bedtime as needed for sleep. What changed: how much to take   Vitamin D  (Ergocalciferol ) 1.25 MG (50000 UNIT) Caps capsule Commonly known as: DRISDOL  Take 50,000 Units by mouth once a week.        Follow-up Information     [facility] (Formerly Follow up.   Why: [facility] will provide home health services.  They will call you in the next 24-48 hours to start services. Contact information: [address] [place] [address] [phone]                 Discharge Exam: Cleavon Curls Weights   12/31/23 0418 01/01/24 0127 01/02/24 0222  Weight: 50.3 kg 50.5 kg 50.4 kg    GENERAL:  Alert, pleasant, no acute distress, thin HEENT:  EOMI CARDIOVASCULAR: RRR, no murmurs appreciated RESPIRATORY:  Clear to  auscultation, no wheezing, rales, or rhonchi GASTROINTESTINAL:  Soft, nontender, nondistended EXTREMITIES:  No LE edema bilaterally NEURO:  No new focal deficits appreciated SKIN:  No rashes noted PSYCH:  Appropriate mood and affect    Condition at discharge: improving  The results of significant diagnostics from this hospitalization (including imaging, microbiology, ancillary and laboratory) are listed below for reference.   Imaging Studies: US  EKG Site Rite Result Date: 01/02/2024 If Site Rite image not attached, placement could not be confirmed due to current cardiac rhythm.  DG Chest 1 View Result Date: 12/29/2023 CLINICAL DATA:  Aspiration pneumonia EXAM: CHEST  1 VIEW COMPARISON:  Dec 23, 2023 FINDINGS: Nasogastric tube in the stomach entrance should be advanced another 10 cm. Left PICC line in good position tip in the SVC Persistent but improving left lung infiltrates correlate with improving pneumonia with minimal residual bilateral basilar atelectasis. No pneumothorax. Heart normal size IMPRESSION: Improving pneumonia and atelectasis Electronically Signed   By: Fredrich Jefferson M.D.   On: 12/29/2023 10:12   DG Abd 1 View Result Date: 12/27/2023 CLINICAL DATA:  [id number] Nasogastric tube present [id number] EXAM: ABDOMEN - 1 VIEW COMPARISON:  CT scan abdomen and pelvis from 12/25/2023. FINDINGS: Redemonstration of several air-filled dilated small bowel loops, grossly similar to the prior study. Please refer to CT scan abdomen and pelvis from 12/25/2023 for details. No evidence of pneumoperitoneum. No acute osseous abnormalities. Redemonstration of heterogeneous opacities overlying the bilateral lower lung zones, also better evaluated on the prior CT scan. Surgical changes, devices, tubes and lines: Enteric tube is seen coursing below the left hemidiaphragm with its tip and side hole overlying the left upper/mid abdomen, overlying the stomach body region. IMPRESSION: *Enteric tube is seen coursing  below the left hemidiaphragm with its tip and side hole overlying the stomach body region. Electronically Signed   By: Beula Brunswick M.D.   On: 12/27/2023 17:24   CT ABDOMEN PELVIS W CONTRAST Result Date: 12/25/2023 CLINICAL DATA:  Nausea, vomiting, possible obstruction EXAM: CT ABDOMEN AND PELVIS WITH CONTRAST TECHNIQUE: Multidetector CT imaging of the abdomen and pelvis was performed using the standard protocol following bolus administration of intravenous contrast. RADIATION DOSE REDUCTION: This exam was performed according to the departmental dose-optimization program which includes automated exposure control, adjustment of the mA and/or kV according to patient size and/or use of iterative reconstruction technique. CONTRAST:  75mL OMNIPAQUE  IOHEXOL  350 MG/ML SOLN COMPARISON:  CT abdomen pelvis 12/13/2023. FINDINGS: Lower chest: Bibasilar lung peribronchovascular ground-glass airspace opacities and patchy consolidative airspace opacities. Diffuse distal esophageal wall thickening. Hepatobiliary: No focal liver abnormality. Status post cholecystectomy. No  biliary dilatation. Pancreas: No focal lesion. Normal pancreatic contour. No surrounding inflammatory changes. No main pancreatic ductal dilatation. Spleen: Normal in size without focal abnormality. Adrenals/Urinary Tract: No adrenal nodule bilaterally. Bilateral kidneys enhance symmetrically. No hydronephrosis. No hydroureter. The urinary bladder is unremarkable. On delayed imaging, there is no excretion of intravenous contrast from the kidneys. Stomach/Bowel: Partial right colectomy. Stomach is within normal limits. Diffuse small bowel wall thickening and dilatation with intraluminal fluid. Diffuse large bowel wall thickening. No transition point. Appendix appears normal. Vascular/Lymphatic: No abdominal aorta or iliac aneurysm. No abdominal, pelvic, or inguinal lymphadenopathy. Reproductive: Uterus and bilateral adnexa are unremarkable. Bilateral Essure  devices. Other: No intraperitoneal free fluid. No intraperitoneal free gas. No organized fluid collection. Musculoskeletal: No abdominal wall hernia or abnormality. No suspicious lytic or blastic osseous lesions. No acute displaced fracture. ltilevel degenerative changes of the spine. IMPRESSION: 1. Bibasilar lung peribronchovascular ground-glass airspace opacities and patchy consolidative airspace opacities. Findings suggestive of pneumonia versus aspiration pneumonia. 2. Persistent esophagitis and enterocolitis as well as ileus. Electronically Signed   By: Morgane  Naveau M.D.   On: 12/25/2023 20:57   DG Abd 1 View Result Date: 12/24/2023 CLINICAL DATA:  Nasogastric tube placement. EXAM: ABDOMEN - 1 VIEW COMPARISON:  Dec 23, 2023 FINDINGS: A nasogastric tube is seen with its distal tip overlying the body of the stomach. This is stable in position when compared to the prior study. Persistently dilated central small bowel loops are seen. Radiopaque surgical clips are seen overlying the right upper quadrant. Fallopian tube sterilization implants are noted. No radio-opaque calculi or other significant radiographic abnormality are seen. IMPRESSION: Nasogastric tube positioning, as described above. Electronically Signed   By: Virgle Grime M.D.   On: 12/24/2023 22:13   ECHOCARDIOGRAM COMPLETE Result Date: 12/24/2023    ECHOCARDIOGRAM REPORT   Patient Name:   ANAIRIS KNICK Date of Exam: 12/24/2023 Medical Rec #:  [id number]          Height:       63.0 in Accession #:    [id number]         Weight:       94.1 lb Date of Birth:  12/01/1980          BSA:          1.404 m Patient Age:    42 years           BP:           122/60 mmHg Patient Gender: F                  HR:           120 bpm. Exam Location:  Inpatient Procedure: 2D Echo, Cardiac Doppler and Color Doppler (Both Spectral and Color            Flow Doppler were utilized during procedure). Indications:    Shock R57.9  History:        Patient has prior  history of Echocardiogram examinations, most                 recent 05/24/2020. Signs/Symptoms:Hypotension.  Sonographer:    Terrilee Few RCS Referring Phys: [id number] OMAR M ALBUSTAMI IMPRESSIONS  1. Left ventricular ejection fraction, by estimation, is 60 to 65%. The left ventricle has normal function. The left ventricle has no regional wall motion abnormalities. Left ventricular diastolic parameters were normal.  2. Right ventricular systolic function is normal. The right ventricular size is normal. There is normal pulmonary  artery systolic pressure. The estimated right ventricular systolic pressure is 22.4 mmHg.  3. A small pericardial effusion is present.  4. The mitral valve is normal in structure. Mild to moderate mitral valve regurgitation.  5. The aortic valve was not well visualized. Aortic valve regurgitation is not visualized. No aortic stenosis is present.  6. The inferior vena cava is normal in size with greater than 50% respiratory variability, suggesting right atrial pressure of 3 mmHg. FINDINGS  Left Ventricle: Left ventricular ejection fraction, by estimation, is 60 to 65%. The left ventricle has normal function. The left ventricle has no regional wall motion abnormalities. The left ventricular internal cavity size was normal in size. There is  no left ventricular hypertrophy. Left ventricular diastolic parameters were normal. Right Ventricle: The right ventricular size is normal. No increase in right ventricular wall thickness. Right ventricular systolic function is normal. There is normal pulmonary artery systolic pressure. The tricuspid regurgitant velocity is 2.20 m/s, and  with an assumed right atrial pressure of 3 mmHg, the estimated right ventricular systolic pressure is 22.4 mmHg. Left Atrium: Left atrial size was normal in size. Right Atrium: Right atrial size was normal in size. Pericardium: A small pericardial effusion is present. Mitral Valve: The mitral valve is normal in structure.  Mild to moderate mitral valve regurgitation. Tricuspid Valve: The tricuspid valve is normal in structure. Tricuspid valve regurgitation is trivial. Aortic Valve: The aortic valve was not well visualized. Aortic valve regurgitation is not visualized. No aortic stenosis is present. Aortic valve peak gradient measures 5.5 mmHg. Pulmonic Valve: The pulmonic valve was grossly normal. Pulmonic valve regurgitation is trivial. Aorta: The aortic root and ascending aorta are structurally normal, with no evidence of dilitation. Venous: The inferior vena cava is normal in size with greater than 50% respiratory variability, suggesting right atrial pressure of 3 mmHg. IAS/Shunts: The interatrial septum was not well visualized.  LEFT VENTRICLE PLAX 2D LVIDd:         4.30 cm   Diastology LVIDs:         2.60 cm   LV e' medial:    9.90 cm/s LV PW:         0.90 cm   LV E/e' medial:  7.5 LV IVS:        0.70 cm   LV e' lateral:   14.70 cm/s LVOT diam:     2.10 cm   LV E/e' lateral: 5.1 LV SV:         37 LV SV Index:   26 LVOT Area:     3.46 cm  RIGHT VENTRICLE             IVC RV S prime:     16.20 cm/s  IVC diam: 1.90 cm TAPSE (M-mode): 1.9 cm LEFT ATRIUM           Index        RIGHT ATRIUM          Index LA diam:      2.10 cm 1.50 cm/m   RA Area:     9.66 cm LA Vol (A2C): 14.5 ml 10.31 ml/m  RA Volume:   18.50 ml 13.18 ml/m LA Vol (A4C): 21.6 ml 15.39 ml/m  AORTIC VALVE AV Area (Vmax): 2.58 cm AV Vmax:        117.00 cm/s AV Peak Grad:   5.5 mmHg LVOT Vmax:      87.00 cm/s LVOT Vmean:     56.100 cm/s LVOT VTI:  0.106 m  AORTA Ao Root diam: 2.80 cm Ao Asc diam:  2.90 cm MITRAL VALVE               TRICUSPID VALVE MV Area (PHT): 6.27 cm    TR Peak grad:   19.4 mmHg MV Decel Time: 121 msec    TR Vmax:        220.00 cm/s MV E velocity: 74.40 cm/s MV A velocity: 63.90 cm/s  SHUNTS MV E/A ratio:  1.16        Systemic VTI:  0.11 m                            Systemic Diam: 2.10 cm Carson Clara MD Electronically signed by  Carson Clara MD Signature Date/Time: 12/24/2023/1:12:05 PM    Final    DG Abd 1 View Result Date: 12/23/2023 CLINICAL DATA:  NG tube placement. EXAM: ABDOMEN - 1 VIEW COMPARISON:  CT 12/13/2023 FINDINGS: Tip and side port of the enteric tube below the diaphragm in the stomach. Dilated small bowel centrally. Enteric sutures in the right abdomen IMPRESSION: Tip and side port of the enteric tube below the diaphragm in the stomach. Electronically Signed   By: Chadwick Colonel M.D.   On: 12/23/2023 20:01   DG Chest Port 1 View Result Date: 12/23/2023 CLINICAL DATA:  Sepsis.  Hypotension. EXAM: PORTABLE CHEST 1 VIEW COMPARISON:  December 13, 2023. FINDINGS: The heart size and mediastinal contours are within normal limits. Interval development of patchy airspace opacities throughout left lung concerning for multifocal pneumonia. Small left pleural effusion may be present. Minimal right basilar subsegmental atelectasis or scarring is noted. The visualized skeletal structures are unremarkable. IMPRESSION: Interval development of patchy left lung airspace opacities concerning for multifocal pneumonia. Minimal left pleural effusion. Electronically Signed   By: Rosalene Colon M.D.   On: 12/23/2023 15:54   US  EKG SITE RITE Result Date: 12/23/2023 If Site Rite image not attached, placement could not be confirmed due to current cardiac rhythm.  CT ABDOMEN PELVIS W CONTRAST Result Date: 12/13/2023 CLINICAL DATA:  Abdominal pain, nausea, and vomiting for 2 days. Gastroesophageal reflux disease. Several weeks postop from colon resection and cholecystectomy. EXAM: CT ABDOMEN AND PELVIS WITH CONTRAST TECHNIQUE: Multidetector CT imaging of the abdomen and pelvis was performed using the standard protocol following bolus administration of intravenous contrast. RADIATION DOSE REDUCTION: This exam was performed according to the departmental dose-optimization program which includes automated exposure control, adjustment of the  mA and/or kV according to patient size and/or use of iterative reconstruction technique. CONTRAST:  80mL OMNIPAQUE  IOHEXOL  300 MG/ML  SOLN COMPARISON:  11/11/2023, 04/26/2020, and MRI in 2018 FINDINGS: Lower Chest: Mild bibasilar atelectasis. Hepatobiliary: Several low-attenuation liver lesions in the right, left, and caudate lobes which were previously characterized by MRI remain stable, consistent with benign etiology. No new or enlarging liver lesions identified. Prior cholecystectomy noted. Stable mild biliary ductal dilatation, consistent with post cholecystectomy effect. Pancreas:  No mass or inflammatory changes. Spleen: Within normal limits in size and appearance. Adrenals/Urinary Tract: No suspicious masses identified. No evidence of ureteral calculi or hydronephrosis. Stomach/Bowel: Nasogastric tube is been removed since previous study. Increased diffuse wall thickening the visualized distal thoracic esophagus is seen, consistent with esophagitis. No hiatal hernia seen. Prior right colectomy again noted. Mild-to-moderate diffuse small bowel dilatation shows no significant change, and no transition point is seen. Mild diffuse small bowel wall thickening shows mild improvement since previous  study. Ascites has resolved since previous study. No evidence of abscess. Vascular/Lymphatic: No pathologically enlarged lymph nodes. No acute vascular findings. Reproductive: No mass or other significant abnormality. Essure micro-inserts are seen bilaterally. Other:  None. Musculoskeletal:  No suspicious bone lesions identified. IMPRESSION: Increased diffuse wall thickening of visualized distal thoracic esophagus, consistent with esophagitis. Mild-to-moderate diffuse small bowel dilatation, without definite transition point or significant interval change, suspicious for ileus. Mild improvement in diffuse small bowel wall thickening, suggesting improving enteritis. Resolution of ascites since previous study. No evidence  of abscess. Electronically Signed   By: Marlyce Sine M.D.   On: 12/13/2023 12:26   DG Chest Port 1 View Result Date: 12/13/2023 CLINICAL DATA:  Chest pain. EXAM: PORTABLE CHEST 1 VIEW COMPARISON:  November 04, 2023. FINDINGS: The heart size and mediastinal contours are within normal limits. Minimal bibasilar subsegmental atelectasis is noted. Minimal pleural effusions may be present. The visualized skeletal structures are unremarkable. IMPRESSION: Minimal bibasilar subsegmental atelectasis. Minimal pleural effusions. Electronically Signed   By: Rosalene Colon M.D.   On: 12/13/2023 11:19    Microbiology: Results for orders placed or performed during the hospital encounter of 12/23/23  Blood Culture (routine x 2)     Status: None   Collection Time: 12/23/23  2:26 PM   Specimen: BLOOD  Result Value Ref Range Status   Specimen Description BLOOD CENTRAL LINE RIGHT FEMORAL ARTERY  Final   Special Requests   Final    BOTTLES DRAWN AEROBIC AND ANAEROBIC Blood Culture adequate volume   Culture   Final    NO GROWTH 5 DAYS Performed at [facility], [address] [place], [place] [address]    Report Status 12/28/2023 FINAL  Final  Blood Culture (routine x 2)     Status: None   Collection Time: 12/23/23  2:46 PM   Specimen: BLOOD LEFT ARM  Result Value Ref Range Status   Specimen Description BLOOD LEFT ARM  Final   Special Requests   Final    BOTTLES DRAWN AEROBIC AND ANAEROBIC Blood Culture results may not be optimal due to an inadequate volume of blood received in culture bottles   Culture   Final    NO GROWTH 5 DAYS Performed at [facility], [address] [place], [place] [address]    Report Status 12/28/2023 FINAL  Final  MRSA Next Gen by PCR, Nasal     Status: None   Collection Time: 12/23/23  8:10 PM   Specimen: Nasal Mucosa; Nasal Swab  Result Value Ref Range Status   MRSA by PCR Next Gen NOT DETECTED NOT DETECTED Final    Comment: (NOTE) The GeneXpert MRSA Assay (FDA  approved for NASAL specimens only), is one component of a comprehensive MRSA colonization surveillance program. It is not intended to diagnose MRSA infection nor to guide or monitor treatment for MRSA infections. Test performance is not FDA approved in patients less than 25 years old. Performed at [facility], [address], [place], [place] [address]     Labs: CBC: Recent Labs  Lab 12/27/23 1058 12/27/23 1318 12/29/23 1813 12/30/23 0329 12/31/23 0307 01/02/24 0321 01/03/24 0218  WBC 6.2   < > 5.7 8.7 10.5 13.0* 12.2*  NEUTROABS 4.2  --   --   --   --   --   --   HGB 5.2*   < > 8.2* 8.1* 8.2* 7.3* 7.3*  HCT 16.7*   < > 25.7* 25.6* 25.6*  24.3* 23.5*  MCV 86.5   < > 87.1 87.1 88.0 90.3 90.4  PLT 357   < > 238 294 313 401* 426*   < > = values in this interval not displayed.   Basic Metabolic Panel: Recent Labs  Lab 12/28/23 0432 12/29/23 0412 12/30/23 0329 12/31/23 0307 01/01/24 0258 01/02/24 0321 01/03/24 0218  NA 137 138 136  136 135  135 135 136 135  K 3.9 3.2* 3.4*  3.4* 3.1*  3.1* 3.3* 3.3* 3.1*  CL 101 100 101  101 100  99 101 104 103  CO2 32 31 29  29 28  28 26 27 25   GLUCOSE 124* 109* 131*  127* 122*  122* 120* 121* 118*  BUN 8 10 9  9 8  8 9 10 17   CREATININE 0.33* 0.32* <0.30*  0.31* <0.30*  0.31* 0.33* 0.35* 0.35*  CALCIUM  7.7* 7.6* 7.4*  7.4* 7.7*  7.7* 7.8* 7.8* 7.7*  MG 2.0 1.5* 1.8 1.5* 1.7 1.7 1.7  PHOS 3.7 3.3 3.3  3.3 3.6  --  3.4  --    Liver Function Tests: Recent Labs  Lab 12/28/23 0432 12/29/23 0412 12/30/23 0329 12/31/23 0307 01/02/24 0321  AST  --  26  --   --  27  ALT  --  27  --   --  72*  ALKPHOS  --  60  --   --  82  BILITOT  --  0.6  --   --  0.4  PROT  --  4.4*  --   --  5.0*  ALBUMIN  <1.5* <1.5* <1.5* <1.5* <1.5*   CBG: No results for input(s): "GLUCAP" in the last 168 hours.  Discharge time spent: 40 minutes.  Length of stay: 11 days  Signed: Jodeane Mulligan, DO [facility] 01/03/2024

## 2024-01-04 ENCOUNTER — Telehealth: Payer: Self-pay | Admitting: *Deleted

## 2024-01-04 NOTE — Transitions of Care (Post Inpatient/ED Visit) (Signed)
 01/04/2024  Name: Stacy [name] MRN: [id number] DOB: 1980/12/03  Today's TOC FU Call Status: Today's TOC FU Call Status:: Successful TOC FU Call Completed TOC FU Call Complete Date: 01/04/24 Patient's Name and Date of Birth confirmed.  Transition Care Management Follow-up Telephone Call Date of Discharge: 01/03/24 Discharge Facility: [facility]) Type of Discharge: Inpatient Admission Primary Inpatient Discharge Diagnosis:: Septic shock- aspiration pneumonia after scheduled EGD:  in setting of colon perforation due to diverticulitis in March 2025 How have you been since you were released from the hospital?: Better ("I am fine, much better.  My husband is here taking great care of me and we are managing the TPN without problems.  The nurse form the IV agency and the home health agency have already been out to see me and I see my PCP tomorrow.  I have no needs") Any questions or concerns?: No  Items Reviewed: Did you receive and understand the discharge instructions provided?: Yes (thoroughly reviewed with patient who verbalizes good understanding of same) Medications obtained,verified, and reconciled?: Yes (Medications Reviewed) (Full medication reconciliation/ review completed; no concerns or discrepancies identified; confirmed patient obtained/ is taking all newly Rx'd medications as instructed; self-manages medications and denies questions/ concerns around medications today) Any new allergies since your discharge?: No Dietary orders reviewed?: Yes Type of Diet Ordered:: "Regular" Do you have support at home?: Yes People in Home [RPT]: spouse Name of Support/Comfort Primary Source: Reports independent in self-care activities; supportive spouse assists as/ if needed/ indicated  Medications Reviewed Today: Medications Reviewed Today     Reviewed by Azoria Abbett M, RN (Registered Nurse) on 01/04/24 at 1448  Med List Status: <None>   Medication Order Taking? Sig Documenting  Provider Last Dose Status Informant  acetaminophen  (TYLENOL ) 500 MG tablet [id number] Yes Take 2 tablets (1,000 mg total) by mouth every 6 (six) hours as needed for mild pain (pain score 1-3), fever or headache. Elwin Hammond, PA-C Taking Active Spouse/Significant Other  cyanocobalamin  (,VITAMIN B-12,) 1000 MCG/ML injection [id number] Yes Inject 1,000 mcg into the muscle every 30 (thirty) days. [provider] Taking Active Spouse/Significant Other           Med Note Nolan Battle, [facility] I   Sat Oct 29, 2023 10:42 PM)    fludrocortisone  (FLORINEF ) 0.1 MG tablet [id number] Yes Take 1 tablet (0.1 mg total) by mouth 2 (two) times daily. Jodeane Mulligan, DO Taking Active   megestrol  (MEGACE  ES) 625 MG/5ML suspension [id number] Yes SHAKE LIQUID AND TAKE 5 ML(625 MG) BY MOUTH DAILY Tabori, Katherine E, MD Taking Active Spouse/Significant Other  metoCLOPramide  (REGLAN ) 10 MG tablet [id number] Yes Take 1 tablet (10 mg total) by mouth 3 (three) times daily with meals. Jodeane Mulligan, DO Taking Active            Med Note (Darric Plante M   Wed Jan 04, 2024  2:39 PM) 01/04/24: Reports during [facility] call, has heard from outpatient pharmacy that this medication should be ready for pick up "today or tomorrow;" confirms she plans to start taking as instructed once ready from outpatient pharmacy  midodrine  (PROAMATINE ) 5 MG tablet [id number] Yes Take 1 tablet (5 mg total) by mouth 3 (three) times daily with meals. Maudine Sos, MD Taking Active Spouse/Significant Other  montelukast  (SINGULAIR ) 10 MG tablet [id number] Yes Take 1 tablet (10 mg total) by mouth daily. Tabori, Katherine E, MD Taking Active Spouse/Significant Other  Multiple Vitamin (MULTIVITAMIN WITH MINERALS) TABS tablet [id number] No Take 1 tablet by  mouth daily.  Patient not taking: Reported on 01/04/2024   Armenta Landau, MD Not Taking Active Spouse/Significant Other  ondansetron  (ZOFRAN -ODT) 4 MG disintegrating tablet [id number] Yes Take  1 tablet (4 mg total) by mouth every 8 (eight) hours as needed for nausea or vomiting. Tabori, Katherine E, MD Taking Active Spouse/Significant Other  pantoprazole  (PROTONIX ) 40 MG tablet [id number] Yes Take 1 tablet (40 mg total) by mouth daily.  Patient taking differently: Take 40 mg by mouth 2 (two) times daily.   Zackowski, Scott, MD Taking Active Spouse/Significant Other  polyethylene glycol (MIRALAX  / GLYCOLAX ) 17 g packet [id number] Yes Take 17 g by mouth 3 (three) times daily.  Patient taking differently: Take 17 g by mouth 3 (three) times daily as needed for moderate constipation.   Armenta Landau, MD Taking Active Spouse/Significant Other  potassium chloride  SA (KLOR-CON  M) 20 MEQ tablet [id number] No Take 1 tablet (20 mEq total) by mouth daily.  Patient not taking: Reported on 01/04/2024   Zackowski, Scott, MD Not Taking Active Spouse/Significant Other           Med Note (Renzo Vincelette M   Wed Jan 04, 2024  2:40 PM) 01/04/24: Reports during [facility] call, she is out of this medication: has PCP office visit 01/05/24- to ask PCP if she needs re-fill  sucralfate  (CARAFATE ) 1 GM/10ML suspension [id number] Yes Take 10 mLs (1 g total) by mouth 4 (four) times daily. Jodeane Mulligan, DO Taking Active   traZODone  (DESYREL ) 50 MG tablet [id number] Yes Take 0.5-1 tablets (25-50 mg total) by mouth at bedtime as needed for sleep.  Patient taking differently: Take 50 mg by mouth at bedtime as needed for sleep.   Tabori, Katherine E, MD Taking Active Spouse/Significant Other  Vitamin D , Ergocalciferol , (DRISDOL ) 1.25 MG (50000 UNIT) CAPS capsule [id number] Yes Take 50,000 Units by mouth once a week. [provider] Taking Active Spouse/Significant Other           Med Note (SATTERFIELD, Ken Patty   Fri Dec 23, 2023  3:05 PM) Take on Wednesdays            Home Care and Equipment/Supplies: Were Home Health Services Ordered?: Yes Name of Home Health Agency:: [facility] for TPN infusion/ [facility] for  RN visits Has Agency set up a time to come to your home?: Yes First Home Health Visit Date: 01/03/24 ("they both came out yesterday to our home, they were here shortly after I got home") Any new equipment or medical supplies ordered?: Yes Name of Medical supply agency?: [facility]- TPN infusion equipment Were you able to get the equipment/medical supplies?: Yes Do you have any questions related to the use of the equipment/supplies?: No  Functional Questionnaire: Do you need assistance with bathing/showering or dressing?: No Do you need assistance with meal preparation?: No Do you need assistance with eating?: No Do you have difficulty maintaining continence: No Do you need assistance with getting out of bed/getting out of a chair/moving?: No Do you have difficulty managing or taking your medications?: No  Follow up appointments reviewed: PCP Follow-up appointment confirmed?: Yes Date of PCP follow-up appointment?: 01/06/24 Follow-up Provider: PCP Specialist Hospital Follow-up appointment confirmed?: No Reason Specialist Follow-Up Not Confirmed: Patient has Specialist Provider Number and will Call for Appointment Do you need transportation to your follow-up appointment?: No Do you understand care options if your condition(s) worsen?: Yes-patient verbalized understanding  SDOH Interventions Today    Flowsheet Row Most Recent Value  SDOH Interventions  Food Insecurity Interventions Intervention Not Indicated  Housing Interventions Intervention Not Indicated  Transportation Interventions Intervention Not Indicated  [drives self at baseline: husband has been providing transportation since her recent illness which started in march 2025]  Utilities Interventions Intervention Not Indicated      Patient declines need for ongoing/ further care management outreach; declines enrollment in 30-day TOC program; provided my direct contact information should questions/ concerns/ needs arise  post-TOC call   See TOC assessment tabs for additional assessment/ TOC intervention information  Pls call/ message for questions,  Naliah Eddington Mckinney Draden Cottingham, RN, BSN, Media planner  Transitions of Care  [facility] [phone]: direct office

## 2024-01-05 ENCOUNTER — Inpatient Hospital Stay: Admitting: Family Medicine

## 2024-01-05 ENCOUNTER — Ambulatory Visit

## 2024-01-05 ENCOUNTER — Telehealth: Payer: Self-pay

## 2024-01-05 NOTE — Telephone Encounter (Unsigned)
 Copied from CRM [id number]. Topic: Clinical - Home Health Verbal Orders >> Jan 05, 2024  3:21 PM Dimple Francis wrote: Caller/Agency: [facility] - [facility] Callback Number: [phone] Service Requested: Physical Therapy Frequency: 1 week 1, 2 weeks 2  Any new concerns about the patient? Yes. Heart rate was going up since March- Has never been lower than 95. She was told at the hospital that she can do activities that don't make it go over 135. Wanting to know the parameters of the heart rate for her to be working on

## 2024-01-05 NOTE — Telephone Encounter (Signed)
 Called and left voicemail to Charisse to call office back so I could give her verbal orders.

## 2024-01-05 NOTE — Telephone Encounter (Signed)
 Ok to provide verbal orders for PT.  Given her nutritional status and deconditioning, I would expect her HR to be running high.  If her HR is >130, she should stop and rest until HR is <115 and then she can resume exercising

## 2024-01-06 NOTE — Telephone Encounter (Signed)
 Left detailed message for Stacy [name] as her VM is secure and confidential. Asked she call back if further information is needed

## 2024-01-10 ENCOUNTER — Telehealth: Payer: Self-pay

## 2024-01-10 NOTE — Telephone Encounter (Signed)
 HH called to inform you of low O2 and higher heart reate. Please advise

## 2024-01-10 NOTE — Telephone Encounter (Signed)
 Pt was recently dc'd from hospital after aspiration pneumonia.  Would expect some degree of crackles to remain.  I believe she has a hospital f/u tomorrow and we can reassess and get imaging if needed

## 2024-01-10 NOTE — Telephone Encounter (Signed)
 Copied from CRM [id number]. Topic: Clinical - Pink Word Triage >> Jan 10, 2024  3:57 PM Kita Perish H wrote: Reason for Triage: [facility] Nurse calling to report seen patient today pulse is slightly elevated at 110 beats per minute, oxygen saturation on finger was 92%, lungs have suspicious sounds, crackles in both lungs. Drew labs today waiting on report back.  [facility] [phone]

## 2024-01-11 ENCOUNTER — Encounter: Payer: Self-pay | Admitting: Family Medicine

## 2024-01-11 ENCOUNTER — Telehealth: Payer: Self-pay

## 2024-01-11 ENCOUNTER — Ambulatory Visit (HOSPITAL_BASED_OUTPATIENT_CLINIC_OR_DEPARTMENT_OTHER)
Admission: RE | Admit: 2024-01-11 | Discharge: 2024-01-11 | Disposition: A | Discharge status: Home / Self Care | Source: Ambulatory Visit | Attending: Family Medicine | Admitting: Family Medicine

## 2024-01-11 ENCOUNTER — Ambulatory Visit (INDEPENDENT_AMBULATORY_CARE_PROVIDER_SITE_OTHER): Admitting: Family Medicine

## 2024-01-11 VITALS — BP 124/88 | HR 109 | Temp 98.2°F | Ht 63.0 in | Wt 107.0 lb

## 2024-01-11 DIAGNOSIS — D509 Iron deficiency anemia, unspecified: Secondary | ICD-10-CM | POA: Diagnosis not present

## 2024-01-11 DIAGNOSIS — J690 Pneumonitis due to inhalation of food and vomit: Secondary | ICD-10-CM | POA: Insufficient documentation

## 2024-01-11 DIAGNOSIS — Z789 Other specified health status: Secondary | ICD-10-CM

## 2024-01-11 MED ORDER — AMOXICILLIN-POT CLAVULANATE 875-125 MG PO TABS: 1.0000 | ORAL_TABLET | Freq: Two times a day (BID) | ORAL | 0 refills | Status: DC

## 2024-01-11 NOTE — Telephone Encounter (Signed)
 [facility] form received. Charge sheet attached and placed in provider bin for signature.

## 2024-01-11 NOTE — Assessment & Plan Note (Signed)
 New.  Reviewed labs and images done from hospital stay.  WBC was 12.2 at d/c and yesterday was up to 14.5.  She again has rhonchi over L lung field.  Will get repeat CXR.  Will start Augmentin  (due to Doxy allergy).  Low threshold to return to ER given hx of sepsis.  Pt expressed understanding and is in agreement w/ plan.

## 2024-01-11 NOTE — Assessment & Plan Note (Signed)
 New.  Currently has double lumen PICC for TPN.  Pt and husband report she is eating regularly at this time.  Will hold TPN after last bag today and will see how she does w/ PO challenge for the next 5 days.  Will repeat labs on Monday to see if electrolytes stabilize.  If she is able to handle PO challenge, can remove PICC.  If she has issues, will need to resume TPN.  Pt expressed understanding and is in agreement w/ plan.

## 2024-01-11 NOTE — Patient Instructions (Signed)
 Follow up in 2 weeks to recheck nutrition and weight Go to [facility] and get your chest xray done START the Augmentin  twice daily- take w/ food HOLD TPN after you finish the remaining bag Please have home health cap your PICC line at least until early next week Please have home health repeat CBC and CMP on Monday to recheck labs Once we have the lab results and see how you are doing on eating, we can decide whether we want to get rid of the PICC line Call GI at least twice weekly to see about the [facility] referral Call with any questions or concerns Hang in there!!!

## 2024-01-11 NOTE — Telephone Encounter (Signed)
 Stacy [name] has been called back and relayed message from Dr Paulla Bossier and she was appreciative.

## 2024-01-11 NOTE — Telephone Encounter (Signed)
FYI. Patient is seeing you today

## 2024-01-11 NOTE — Assessment & Plan Note (Signed)
 Deteriorated.  Pt's Hgb was 7.3 at time of D/C- after 2 units PRBC's.  Hgb yesterday 7.5  She is very pale.  Will refer to Hematology b/c she will likely need either iron  infusions or blood transfusions.  Stressed importance of nutrition.  Will continue to follow closely.

## 2024-01-11 NOTE — Progress Notes (Signed)
   Subjective:    Patient ID: Stacy [name], female    DOB: 29-Feb-1981, 42 y.o.   MRN: [id number]  HPI Hospital f/u- pt was admitted 5/9-5/20 after an EGD caused aspiration, hypotension, and hypoxia.  She was sent to ICU w/ septic shock and required Levophed .  Was transferred out of ICU on 5/13 once weaned off pressors.  Completed 7 days of Unasyn  for bilateral aspiration PNA.  O2 was weaned and hypoxia resolved.  Leukocytosis also resolved prior to d/c.  NG tube was removed on 5/16 but it was felt that she would not be able to meet her caloric requirement.  Triple lumen PICC switched to double lumen for TPN.  She received 2 units PRBC's- 1 on 5/13 and 1 on 5/14.  Hgb was 7.3 on morning of d/c.  She also get Venofer  x2 on 5/9 and 5/10.    Yesterday HH heard crackles over L lung.  + wet cough.  Pt reports some sinus congestion and HA.  HH did labs yesterday that showed Hgb at 7.5 but WBC had increased from 12.2 --> 14.5.    Pt is having regular BM's- had normal, large BM this morning.  Is eating 3 meals/day, some small snacks throughout the day.  Pt and husband feel she is eating mostly normally.     Review of Systems For ROS see HPI     Objective:   Physical Exam Vitals reviewed.  Constitutional:      Appearance: She is ill-appearing.  HENT:     Nose:     Comments: TTP over frontal and maxillary sinuses Eyes:     Extraocular Movements: Extraocular movements intact.     Conjunctiva/sclera: Conjunctivae normal.  Cardiovascular:     Rate and Rhythm: Normal rate and regular rhythm.  Pulmonary:     Effort: No respiratory distress.     Breath sounds: Rhonchi (crackles over L lung fields) present.  Skin:    General: Skin is warm and dry.     Coloration: Skin is pale.  Neurological:     General: No focal deficit present.     Mental Status: She is alert and oriented to person, place, and time.  Psychiatric:        Mood and Affect: Mood normal.        Behavior: Behavior normal.         Thought Content: Thought content normal.           Assessment & Plan:

## 2024-01-11 NOTE — Telephone Encounter (Signed)
 Copied from CRM [id number]. Topic: Clinical - Home Health Verbal Orders >> Jan 11, 2024  9:47 AM Adonis Hoot wrote: Caller/Agency: [facility] Callback Number: [phone]  Any new concerns about the patient? Yes Physical Therapist called to let provider know that patients  saturation is 88 to 89% at rest and dropping to 85 % with standing.Patient is scheduled to see provider today

## 2024-01-12 ENCOUNTER — Encounter: Payer: Self-pay | Admitting: Hematology and Oncology

## 2024-01-12 ENCOUNTER — Inpatient Hospital Stay

## 2024-01-12 ENCOUNTER — Telehealth: Payer: Self-pay

## 2024-01-12 ENCOUNTER — Inpatient Hospital Stay: Attending: Hematology and Oncology | Admitting: Hematology and Oncology

## 2024-01-12 ENCOUNTER — Other Ambulatory Visit: Payer: Self-pay | Admitting: Hematology and Oncology

## 2024-01-12 ENCOUNTER — Ambulatory Visit: Payer: Self-pay | Admitting: Hematology and Oncology

## 2024-01-12 VITALS — BP 104/68 | HR 118 | Temp 98.8°F | Resp 18 | Ht 63.0 in | Wt 108.0 lb

## 2024-01-12 DIAGNOSIS — E43 Unspecified severe protein-calorie malnutrition: Secondary | ICD-10-CM | POA: Diagnosis not present

## 2024-01-12 DIAGNOSIS — D509 Iron deficiency anemia, unspecified: Secondary | ICD-10-CM | POA: Diagnosis not present

## 2024-01-12 DIAGNOSIS — D500 Iron deficiency anemia secondary to blood loss (chronic): Secondary | ICD-10-CM | POA: Insufficient documentation

## 2024-01-12 DIAGNOSIS — Z8701 Personal history of pneumonia (recurrent): Secondary | ICD-10-CM | POA: Diagnosis not present

## 2024-01-12 DIAGNOSIS — J189 Pneumonia, unspecified organism: Secondary | ICD-10-CM | POA: Diagnosis not present

## 2024-01-12 DIAGNOSIS — R636 Underweight: Secondary | ICD-10-CM | POA: Diagnosis not present

## 2024-01-12 LAB — IRON AND IRON BINDING CAPACITY (CC-WL,HP ONLY)
Iron: 8 ug/dL — ABNORMAL LOW (ref 28–170)
Saturation Ratios: 4 % — ABNORMAL LOW (ref 10.4–31.8)
TIBC: 230 ug/dL — ABNORMAL LOW (ref 250–450)
UIBC: 222 ug/dL (ref 148–442)

## 2024-01-12 LAB — CBC WITH DIFFERENTIAL/PLATELET
Abs Immature Granulocytes: 0.37 10*3/uL — ABNORMAL HIGH (ref 0.00–0.07)
Basophils Absolute: 0.1 10*3/uL (ref 0.0–0.1)
Basophils Relative: 0 %
Eosinophils Absolute: 0.1 10*3/uL (ref 0.0–0.5)
Eosinophils Relative: 1 %
HCT: 26.1 % — ABNORMAL LOW (ref 36.0–46.0)
Hemoglobin: 8.2 g/dL — ABNORMAL LOW (ref 12.0–15.0)
Immature Granulocytes: 2 %
Lymphocytes Relative: 11 %
Lymphs Abs: 1.9 10*3/uL (ref 0.7–4.0)
MCH: 27.4 pg (ref 26.0–34.0)
MCHC: 31.4 g/dL (ref 30.0–36.0)
MCV: 87.3 fL (ref 80.0–100.0)
Monocytes Absolute: 1.4 10*3/uL — ABNORMAL HIGH (ref 0.1–1.0)
Monocytes Relative: 8 %
Neutro Abs: 12.9 10*3/uL — ABNORMAL HIGH (ref 1.7–7.7)
Neutrophils Relative %: 78 %
Platelets: 553 10*3/uL — ABNORMAL HIGH (ref 150–400)
RBC: 2.99 MIL/uL — ABNORMAL LOW (ref 3.87–5.11)
RDW: 17.4 % — ABNORMAL HIGH (ref 11.5–15.5)
WBC: 16.7 10*3/uL — ABNORMAL HIGH (ref 4.0–10.5)
nRBC: 0.2 % (ref 0.0–0.2)

## 2024-01-12 LAB — CMP (CANCER CENTER ONLY)
ALT: 42 U/L (ref 0–44)
AST: 23 U/L (ref 15–41)
Albumin: 2.6 g/dL — ABNORMAL LOW (ref 3.5–5.0)
Alkaline Phosphatase: 101 U/L (ref 38–126)
Anion gap: 6 (ref 5–15)
BUN: 17 mg/dL (ref 6–20)
CO2: 29 mmol/L (ref 22–32)
Calcium: 8.2 mg/dL — ABNORMAL LOW (ref 8.9–10.3)
Chloride: 100 mmol/L (ref 98–111)
Creatinine: 0.34 mg/dL — ABNORMAL LOW (ref 0.44–1.00)
GFR, Estimated: >60 mL/min (ref >60)
Glucose, Bld: 106 mg/dL — ABNORMAL HIGH (ref 70–99)
Potassium: 3.8 mmol/L (ref 3.5–5.1)
Sodium: 135 mmol/L (ref 135–145)
Total Bilirubin: 0.3 mg/dL (ref 0.0–1.2)
Total Protein: 6.7 g/dL (ref 6.5–8.1)

## 2024-01-12 LAB — SAMPLE TO BLOOD BANK

## 2024-01-12 LAB — FERRITIN: Ferritin: 517 ng/mL — ABNORMAL HIGH (ref 11–307)

## 2024-01-12 NOTE — Telephone Encounter (Signed)
 Called and given below message. She verbalized understanding.

## 2024-01-12 NOTE — Telephone Encounter (Signed)
-----   Message from Almeda Jacobs sent at 01/12/2024 12:23 PM EDT ----- Pls call her/husband. Iron  studies confirmed iron  def, I sent order for her to receive infusion at [address], they will call her for appt

## 2024-01-12 NOTE — Assessment & Plan Note (Addendum)
 The patient has developed significant complications after recent surgery with profound malnutrition and weight loss She is still receiving total parenteral nutrition Her serum albumin  and total protein has improved She will continue treatment as directed by her primary care doctor

## 2024-01-12 NOTE — Telephone Encounter (Signed)
 The discussion at her visit yesterday was to hold TPN after her last bag (last night) and see if she was able to maintain nutrition on her own.  I don't want to remove the PICC line at this time (if possible) in case she is unable to tolerate the PO challenge and we need to resume TPN.  The plan was to repeat CBC and CMP on Monday.

## 2024-01-12 NOTE — Telephone Encounter (Signed)
 Called and scheduled appt for today. She is aware of appt.

## 2024-01-12 NOTE — Telephone Encounter (Signed)
 Please see notes  Copied from CRM [id number]. Topic: Clinical - Prescription Issue >> Jan 12, 2024 12:31 PM Kita Perish H wrote: Reason for CRM: [facility]. Wants to clarify if patient was told to stop therapy for 1 week continue to get labs and get re-accessed, states patient recently started on new therapy patient started having unexpected weight gain and states she was told to stop. Please be urgent patient is out of the medication and he would need to scramble to order it.  [facility]  [phone]

## 2024-01-12 NOTE — Assessment & Plan Note (Addendum)
 The patient is known to have recurrent iron  deficiency anemia secondary to poor oral intake and blood loss She had received vitamin B12 injection monthly as well as 2 doses of intravenous iron  recently while hospitalized She remain anemic with signs of iron  deficiency I recommend 2 more doses of IV iron  sucrose at 500 mg to increase her blood count more rapidly She does not need blood transfusion today The most likely cause of her anemia is due to chronic blood loss/malabsorption syndrome. We discussed some of the risks, benefits, and alternatives of intravenous iron  infusions. The patient is symptomatic from anemia and the iron  level is critically low. She tolerated oral iron  supplement poorly and desires to achieved higher levels of iron  faster for adequate hematopoesis. Some of the side-effects to be expected including risks of infusion reactions, phlebitis, headaches, nausea and fatigue.  The patient is willing to proceed. Patient education material was dispensed.  Goal is to keep ferritin level greater than 50 and resolution of anemia She has a lot of appointment to see her primary care doctor in the near future After 2 doses of intravenous iron  sucrose, I recommend repeat CBC with iron  studies approximately 4 to 6 weeks after the last dose to ensure adequate replacement.  If she is not able to get her iron  studies repeated, her husband will call me and we will set her appointment here for blood count monitoring

## 2024-01-12 NOTE — Progress Notes (Signed)
 [facility] OFFICE PROGRESS NOTE  Jess Morita, MD  ASSESSMENT & PLAN:  Assessment & Plan Iron  deficiency anemia, unspecified iron  deficiency anemia type The patient is known to have recurrent iron  deficiency anemia secondary to poor oral intake and blood loss She had received vitamin B12 injection monthly as well as 2 doses of intravenous iron  recently while hospitalized She remain anemic with signs of iron  deficiency I recommend 2 more doses of IV iron  sucrose at 500 mg to increase her blood count more rapidly She does not need blood transfusion today The most likely cause of her anemia is due to chronic blood loss/malabsorption syndrome. We discussed some of the risks, benefits, and alternatives of intravenous iron  infusions. The patient is symptomatic from anemia and the iron  level is critically low. She tolerated oral iron  supplement poorly and desires to achieved higher levels of iron  faster for adequate hematopoesis. Some of the side-effects to be expected including risks of infusion reactions, phlebitis, headaches, nausea and fatigue.  The patient is willing to proceed. Patient education material was dispensed.  Goal is to keep ferritin level greater than 50 and resolution of anemia She has a lot of appointment to see her primary care doctor in the near future After 2 doses of intravenous iron  sucrose, I recommend repeat CBC with iron  studies approximately 4 to 6 weeks after the last dose to ensure adequate replacement.  If she is not able to get her iron  studies repeated, her husband will call me and we will set her appointment here for blood count monitoring  Underweight The patient has developed significant complications after recent surgery with profound malnutrition and weight loss She is still receiving total parenteral nutrition Her serum albumin  and total protein has improved She will continue treatment as directed by her primary care doctor Recurrent  pneumonia I have reviewed chest x-ray that was taken yesterday Report is still not available but it appears that the patient have signs of recurrent infection especially on the left middle lobe She will continue antibiotics as directed by her primary care doctor Her oxygen saturation on room air is adequate and the patient has been afebrile    No orders of the defined types were placed in this encounter.   INTERVAL HISTORY: Patient returns for recurrent anemia She had multiple surgeries in March after presentation with chronic intestinal pseudoobstruction.  She also developed infection/septic shock, severe protein calorie malnutrition requiring total parenteral nutrition, severe iron  deficiency anemia requiring blood transfusion support and IV iron , aspiration pneumonia with respiratory failure, etc. Since discharge home, she is gradually improving She is able to tolerate approximately 75% of nutritional intake by mouth However, yesterday, she developed worsening cough and was started on antibiotics.  She had chest x-ray done, report was still not available.  She denies recurrent fever or chills The patient denies any recent signs or symptoms of bleeding such as spontaneous epistaxis, hematuria or hematochezia. I have reviewed her electronic records extensively.   I reviewed blood work and imaging studies with the patient and family  SUMMARY OF HEMATOLOGIC HISTORY:  Please see my original consult note from the hospital dated May 26, 2020 for further details. She has past medical history significant for anxiety, chronic intestinal pseudoobstruction, GERD, failure to thrive.  The patient was admitted to the hospital with dehydration, nausea, vomiting, abdominal pain.  Labs on admission showed a WBC of 14.1 hemoglobin 8.7, normal platelet count 334,000, total protein 4.0, albumin  1.5.  Additional labs performed  on 10/1 showed a reticulocyte count percentage of 3.6%, absolute reticulocyte  count of 92.1, immature reticulocyte fraction of 24%, vitamin B12 level was 514, folate 6.0, ferritin 147, iron  23, TIBC 85, percent saturation 27%.  Stool for occult blood was checked this admission on 10/4 and was positive.  The patient has received 2 units PRBCs in that admission and received a dose of Feraheme  510 mg IV on 05/17/2020. The patient was seen by gastroenterology and underwent upper endoscopy on 10/5 which showed a normal esophagus, diffuse moderately erythematous mucosa without bleeding found in the entire stomach which was biopsied, examined duodenum was normal.  Biopsy of stomach antrum showed mild reactive gastropathy, negative H. pylori, negative for malignancy and biopsy of the stomach body showed reactive gastropathy and no malignancy.   Review of the patient's prior CBC results available to me in epic show that she has been persistently anemic since August 2020.  Prior to that, she had intermittent anemia dating back to 2013.   She has not noticed any bleeding such as epistaxis, hemoptysis, hematemesis, hematuria, hematochezia. She takes oral iron  and states that stools are black normally. She reports that she is on vitamin B12 injections monthly at her primary care provider's office. She has never received IV iron  prior to this admission but has been taking oral iron  twice a day, which she tolerated poorly due to severe constipation. Has only received 1 blood transfusion previously which was during her hospitalization in September 2021. The patient is married and has 2 children. Denies history of alcohol tobacco use. Hematology was asked see the patient to make recommendations regarding her anemia.   She never donated blood.  Up until recently, she denies the need for blood transfusion She has not had any menstruation for many months due to her malnourished state The patient denies any recent signs or symptoms of bleeding such as spontaneous epistaxis, hematuria or hematochezia. She  has noticed some passage of dark stool in the past The patient take regular NSAID prior to admission The patient received intravenous iron  and B12 injection in 2021 and again in September 2024 The patient was hospitalized multiple times between March, April and May 2025 and had multiple intestinal surgeries   Vitals:   01/12/24 1051  BP: 104/68  Pulse: (!) 118  Resp: 18  Temp: 98.8 F (37.1 C)  SpO2: 93%

## 2024-01-12 NOTE — Assessment & Plan Note (Addendum)
 I have reviewed chest x-ray that was taken yesterday Report is still not available but it appears that the patient have signs of recurrent infection especially on the left middle lobe She will continue antibiotics as directed by her primary care doctor Her oxygen saturation on room air is adequate and the patient has been afebrile

## 2024-01-13 ENCOUNTER — Telehealth: Payer: Self-pay | Admitting: Hematology and Oncology

## 2024-01-13 ENCOUNTER — Telehealth: Payer: Self-pay | Admitting: Family Medicine

## 2024-01-13 NOTE — Telephone Encounter (Signed)
 Orders was sent from [facility] and needs signatures and sent back to its designated place. Paper work is placed in [employer].  Please advise, Thanks

## 2024-01-13 NOTE — Telephone Encounter (Signed)
 Forms Faxed 01/13/2024  Ilona Malta, CMA

## 2024-01-13 NOTE — Telephone Encounter (Signed)
 Placed in your sign folder

## 2024-01-13 NOTE — Telephone Encounter (Signed)
 Called the number left by Stacy [name] and the office stated he was not in yet and they would leave him a message to give our office a call back.

## 2024-01-13 NOTE — Telephone Encounter (Signed)
 Home health called and I relayed these messages.

## 2024-01-13 NOTE — Telephone Encounter (Signed)
 Forms completed and returned to [employer]

## 2024-01-14 ENCOUNTER — Telehealth: Payer: Self-pay

## 2024-01-14 NOTE — Telephone Encounter (Signed)
 Dr. Marton Sleeper, patient will be scheduled as soon as possible.  Auth Submission: NO AUTH NEEDED Site of care: Site of care: [facility] Payer: [employer] commercial Medication & CPT/J Code(s) submitted: Venofer  (Iron  Sucrose) J1756 Route of submission (phone, fax, portal):  Phone # Fax # Auth type: Buy/Bill PB Units/visits requested: 500mg  x 2 doses Reference number:  Approval from: 01/14/24 to 05/15/24

## 2024-01-16 ENCOUNTER — Other Ambulatory Visit: Payer: Self-pay | Admitting: Family Medicine

## 2024-01-17 LAB — LAB REPORT - SCANNED: EGFR: 125

## 2024-01-18 ENCOUNTER — Telehealth: Payer: Self-pay

## 2024-01-18 NOTE — Telephone Encounter (Signed)
 Copied from CRM [id number]. Topic: Clinical - Home Health Verbal Orders >> Jan 17, 2024  5:50 PM Lajean Pike wrote: Caller/Agency: [facility]  Callback Number: [phone] Service Requested: Skilled Nursing visits once a week for four weeks and remove the visit for Friday June 6th due to visit had on Monday June 2nd where nurse did drawing of blood and changing the dressing and wants to know when to remove the picc line.  Frequency: Once a week for four weeks - recertifcation for once a week Mondays for four weeks.  Any new concerns about the patient? Weight is stable.

## 2024-01-18 NOTE — Telephone Encounter (Signed)
 Ok to give verbal orders and ok to remove PICC line as long as pt is eating regularly and not vomiting

## 2024-01-18 NOTE — Telephone Encounter (Signed)
**Note De-identified  [place] Obfuscation** Please advise 

## 2024-01-18 NOTE — Telephone Encounter (Signed)
 Verbal order was given.

## 2024-01-19 ENCOUNTER — Telehealth: Payer: Self-pay

## 2024-01-19 NOTE — Telephone Encounter (Signed)
 Charisse from [facility] needing verbal okay for PT

## 2024-01-19 NOTE — Telephone Encounter (Signed)
Ok to provide verbal order?

## 2024-01-19 NOTE — Telephone Encounter (Signed)
Verbal orders relayed.

## 2024-01-19 NOTE — Telephone Encounter (Signed)
 Copied from CRM [id number]. Topic: Clinical - Home Health Verbal Orders >> Jan 18, 2024  3:46 PM Marlan Silva wrote: Caller/Agency: [facility] Charisse Callback Number: [phone] (secure line) Service Requested: Physical Therapy Frequency: Verbal Home Health PT  2x's a week for 4 weeks No concerns Any new concerns about the patient? No

## 2024-01-20 ENCOUNTER — Telehealth: Payer: Self-pay

## 2024-01-20 NOTE — Telephone Encounter (Signed)
 Copied from CRM [id number]. Topic: Clinical - Medical Advice >> Jan 20, 2024  9:53 AM Albertha Alosa wrote: Reason for CRM: Sherleen Dirks from home health care called in wanting to speak with nurse as soon as possible needs to confirm some things   [phone]

## 2024-01-20 NOTE — Telephone Encounter (Signed)
 HH nurse is asking if she can stop all lab draws at home  She is stating Dr.Tabori has ordered labs for [facility] to draw weekly. She is stating pt is fragile and she does not want to do the lab draw anymore. Please advise

## 2024-01-22 ENCOUNTER — Ambulatory Visit: Payer: Self-pay | Admitting: Family Medicine

## 2024-01-22 NOTE — Telephone Encounter (Signed)
 Ok to stop [facility] labs.  We can draw needed labs here in office.  Aware of pt's fragility- that is why labs were needed

## 2024-01-23 ENCOUNTER — Other Ambulatory Visit

## 2024-01-23 ENCOUNTER — Telehealth: Payer: Self-pay

## 2024-01-23 ENCOUNTER — Ambulatory Visit: Admitting: Hematology and Oncology

## 2024-01-23 NOTE — Telephone Encounter (Signed)
 I see pt tomorrow in office.  We can make sure HH is not needed at that time and then given orders to d/c.  I don't want to do that today since I haven't seen her yet

## 2024-01-23 NOTE — Telephone Encounter (Addendum)
 Type of form received: [facility] orders for signature   Additional comments: Second set of orders requested for sign date and return (attached to first set)   Received by: fax   Form should be Faxed/mailed to: [phone]   Is patient requesting call for pickup: no    Form placed:  in provider folder at front desk   Attach charge sheet.  Provider will determine charge.   Individual made aware of 3-5 business day turn around No?

## 2024-01-23 NOTE — Telephone Encounter (Signed)
 HH would like verbal orders to discharge pt on 01/26/2024 Please advise

## 2024-01-23 NOTE — Telephone Encounter (Signed)
 [facility] nurse has been made aware of this.

## 2024-01-23 NOTE — Telephone Encounter (Addendum)
 Type of form received: [facility] orders for signature  Additional comments: please sign date and return  Received by: fax  Form should be Faxed/mailed to: [phone]  Is patient requesting call for pickup: no   Form placed:  in provider folder at front desk  Attach charge sheet.  Provider will determine charge.  Individual made aware of 3-5 business day turn around No?

## 2024-01-23 NOTE — Telephone Encounter (Signed)
 Placed in folder at nurse station

## 2024-01-23 NOTE — Telephone Encounter (Signed)
HH has been notified

## 2024-01-23 NOTE — Telephone Encounter (Signed)
 Type of form received: [facility] orders for signature   Additional comments: Third set of orders requested for sign date and return (attached other sets)   Received by: fax   Form should be Faxed/mailed to: [phone]   Is patient requesting call for pickup: no    Form placed:  in provider folder at front desk   Attach charge sheet.  Provider will determine charge.   Individual made aware of 3-5 business day turn around No?

## 2024-01-24 ENCOUNTER — Telehealth: Payer: Self-pay

## 2024-01-24 ENCOUNTER — Encounter: Payer: Self-pay | Admitting: Family Medicine

## 2024-01-24 ENCOUNTER — Ambulatory Visit: Admitting: Family Medicine

## 2024-01-24 VITALS — BP 100/68 | HR 86 | Temp 98.0°F | Ht 63.0 in | Wt 96.4 lb

## 2024-01-24 DIAGNOSIS — E43 Unspecified severe protein-calorie malnutrition: Secondary | ICD-10-CM

## 2024-01-24 DIAGNOSIS — E538 Deficiency of other specified B group vitamins: Secondary | ICD-10-CM | POA: Diagnosis not present

## 2024-01-24 DIAGNOSIS — J690 Pneumonitis due to inhalation of food and vomit: Secondary | ICD-10-CM | POA: Diagnosis not present

## 2024-01-24 LAB — BASIC METABOLIC PANEL WITH GFR
BUN: 15 mg/dL (ref 6–23)
CO2: 37 meq/L — ABNORMAL HIGH (ref 19–32)
Calcium: 8.7 mg/dL (ref 8.4–10.5)
Chloride: 93 meq/L — ABNORMAL LOW (ref 96–112)
Creatinine, Ser: 0.39 mg/dL — ABNORMAL LOW (ref 0.40–1.20)
GFR: 121.57 mL/min (ref >60.00)
Glucose, Bld: 77 mg/dL (ref 70–99)
Potassium: 3.1 meq/L — ABNORMAL LOW (ref 3.5–5.1)
Sodium: 138 meq/L (ref 135–145)

## 2024-01-24 LAB — CBC WITH DIFFERENTIAL/PLATELET
Basophils Absolute: 0 10*3/uL (ref 0.0–0.1)
Basophils Relative: 0.3 % (ref 0.0–3.0)
Eosinophils Absolute: 0.1 10*3/uL (ref 0.0–0.7)
Eosinophils Relative: 0.6 % (ref 0.0–5.0)
HCT: 30.1 % — ABNORMAL LOW (ref 36.0–46.0)
Hemoglobin: 9.7 g/dL — ABNORMAL LOW (ref 12.0–15.0)
Lymphocytes Relative: 19.7 % (ref 12.0–46.0)
Lymphs Abs: 2.4 10*3/uL (ref 0.7–4.0)
MCHC: 32.2 g/dL (ref 30.0–36.0)
MCV: 81 fl (ref 78.0–100.0)
Monocytes Absolute: 0.8 10*3/uL (ref 0.1–1.0)
Monocytes Relative: 6.9 % (ref 3.0–12.0)
Neutro Abs: 8.8 10*3/uL — ABNORMAL HIGH (ref 1.4–7.7)
Neutrophils Relative %: 72.5 % (ref 43.0–77.0)
Platelets: 686 10*3/uL — ABNORMAL HIGH (ref 150.0–400.0)
RBC: 3.71 Mil/uL — ABNORMAL LOW (ref 3.87–5.11)
RDW: 17.1 % — ABNORMAL HIGH (ref 11.5–15.5)
WBC: 12.1 10*3/uL — ABNORMAL HIGH (ref 4.0–10.5)

## 2024-01-24 LAB — MAGNESIUM: Magnesium: 1.8 mg/dL (ref 1.5–2.5)

## 2024-01-24 MED ORDER — CYANOCOBALAMIN 1000 MCG/ML IJ SOLN
1000.0000 ug | Freq: Once | INTRAMUSCULAR | Status: AC
  Administered 2024-02-16: 1000 ug via INTRAMUSCULAR

## 2024-01-24 MED ORDER — CYANOCOBALAMIN 1000 MCG/ML IJ SOLN
1000.0000 ug | Freq: Once | INTRAMUSCULAR | Status: AC
Start: 2024-01-24 — End: 2024-01-24
  Administered 2024-01-24: 1000 ug via INTRAMUSCULAR

## 2024-01-24 NOTE — Telephone Encounter (Signed)
 Faxed and placed in scan bin

## 2024-01-24 NOTE — Telephone Encounter (Signed)
 Faxed back to requested number by Tonya

## 2024-01-24 NOTE — Progress Notes (Signed)
   Subjective:    Patient ID: Stacy [name], female    DOB: 1981-02-17, 43 y.o.   MRN: [id number]  HPI Aspiration PNA- pt finished course of Augmentin .  Cough has resolved.  No fever.    Malnutrition- pt is down 12 lbs since 5/29 but husband feels this is fluid related.  Pt reports she is eating and drinking and trying to get around w/o her walker.  'trying to get back to my normal self'.  PICC line removed.  B12 deficiency- due for repeat injxn   Review of Systems For ROS see HPI     Objective:   Physical Exam Vitals reviewed.  Constitutional:      General: She is not in acute distress.    Appearance: Normal appearance. She is well-developed.  HENT:     Head: Normocephalic and atraumatic.  Eyes:     Conjunctiva/sclera: Conjunctivae normal.     Pupils: Pupils are equal, round, and reactive to light.  Neck:     Thyroid : No thyromegaly.  Cardiovascular:     Rate and Rhythm: Normal rate and regular rhythm.     Pulses: Normal pulses.     Heart sounds: Normal heart sounds. No murmur heard. Pulmonary:     Effort: Pulmonary effort is normal. No respiratory distress.     Breath sounds: Normal breath sounds.  Abdominal:     General: There is no distension.     Palpations: Abdomen is soft.     Tenderness: There is no abdominal tenderness.  Musculoskeletal:     Cervical back: Normal range of motion and neck supple.     Right lower leg: No edema.     Left lower leg: No edema.  Lymphadenopathy:     Cervical: No cervical adenopathy.  Skin:    General: Skin is warm and dry.  Neurological:     General: No focal deficit present.     Mental Status: She is alert and oriented to person, place, and time.  Psychiatric:        Mood and Affect: Mood normal.        Behavior: Behavior normal.        Thought Content: Thought content normal.           Assessment & Plan:  B12 deficiency- injxn given

## 2024-01-24 NOTE — Assessment & Plan Note (Signed)
 Ongoing issue.  PICC line was removed.  Pt is now eating and drinking normally and goal is to maintain weight.  The weight loss between last visit and this visit was fluid retention.  Will continue to monitor.

## 2024-01-24 NOTE — Patient Instructions (Signed)
 Follow up in 3 weeks to recheck weight and electrolytes We'll notify you of your lab results and make any changes if needed I'M SO PROUD OF YOU!!!! Keep eating and drinking- you're a rock star!!! Call with any questions or concerns Stay Safe!  Stay Healthy! Hang in there!!!

## 2024-01-24 NOTE — Telephone Encounter (Signed)
 Placed in your sign folder

## 2024-01-24 NOTE — Telephone Encounter (Signed)
 Type of form received: [facility]   Additional comments: orders to be signed and returned    Received by: fax  Form should be Faxed/mailed to: [phone]  Is patient requesting call for pickup: no  Form placed:  in provider front office file   Attach charge sheet.  Provider will determine charge.  Individual made aware of 3-5 business day turn around No?

## 2024-01-24 NOTE — Assessment & Plan Note (Signed)
 Improved.  Clinically pt is much better.  Cough has resolved, she completed antibiotics.  No longer struggling w/ SOB.  Afebrile.  Will repeat WBC to ensure this is trending back to normal range.

## 2024-01-24 NOTE — Telephone Encounter (Signed)
 Form completed and returned to [place]

## 2024-01-25 ENCOUNTER — Ambulatory Visit: Payer: Self-pay | Admitting: Family Medicine

## 2024-01-25 NOTE — Telephone Encounter (Signed)
 Called and spoke with patient about lab results. Patient acknowledged understanding with no questions or concerns.

## 2024-01-25 NOTE — Telephone Encounter (Signed)
-----   Message from Laymon Priest sent at 01/25/2024  7:27 AM EDT ----- Potassium is low but stable.  We need to start Kdur 20meq twice daily (#60, 3 refills)  White blood cell count is trending down- this is good news!!! Hemoglobin (blood count) is trending up- also good news!!!  No other changes at this time

## 2024-01-26 ENCOUNTER — Ambulatory Visit: Admitting: *Deleted

## 2024-01-26 VITALS — BP 84/51 | HR 78 | Temp 98.0°F | Resp 16 | Ht 63.0 in | Wt 92.0 lb

## 2024-01-26 DIAGNOSIS — D509 Iron deficiency anemia, unspecified: Secondary | ICD-10-CM

## 2024-01-26 MED ORDER — IRON SUCROSE 500 MG IVPB - SIMPLE MED
500.0000 mg | Freq: Once | INTRAVENOUS | Status: DC
  Filled 2024-01-26: qty 275

## 2024-01-26 MED ORDER — SODIUM CHLORIDE 0.9 % IV SOLN
500.0000 mg | Freq: Once | INTRAVENOUS | Status: AC
  Administered 2024-01-26: 500 mg via INTRAVENOUS
  Filled 2024-01-26: qty 25

## 2024-01-26 NOTE — Progress Notes (Signed)
 Diagnosis: Iron  Deficiency Anemia  Provider:  Praveen Mannam MD  Procedure: IV Infusion  IV Type: Peripheral, IV Location: L Antecubital  Venofer  (Iron  Sucrose), Dose: 500 mg  Infusion Start Time: 0936  Infusion Stop Time: 1356  Post Infusion IV Care: Observation period completed and Peripheral IV Discontinued  Discharge: Condition: Good, Destination: Home . AVS Declined  Performed by:  Devonne Folk, RN

## 2024-01-27 ENCOUNTER — Telehealth: Payer: Self-pay | Admitting: Family Medicine

## 2024-01-27 NOTE — Telephone Encounter (Signed)
 Placed in provider folder to be reviewed and signed

## 2024-01-27 NOTE — Telephone Encounter (Signed)
 Home Health orders was sent from [facility] and needs signatures and sent back to its designated place. Paper work is placed in providers box up front.  Please advise, Thanks

## 2024-01-31 ENCOUNTER — Telehealth: Payer: Self-pay | Admitting: Family Medicine

## 2024-01-31 ENCOUNTER — Telehealth: Payer: Self-pay

## 2024-01-31 DIAGNOSIS — I951 Orthostatic hypotension: Secondary | ICD-10-CM

## 2024-01-31 DIAGNOSIS — E2740 Unspecified adrenocortical insufficiency: Secondary | ICD-10-CM | POA: Diagnosis not present

## 2024-01-31 DIAGNOSIS — Z452 Encounter for adjustment and management of vascular access device: Secondary | ICD-10-CM | POA: Diagnosis not present

## 2024-01-31 DIAGNOSIS — K7689 Other specified diseases of liver: Secondary | ICD-10-CM

## 2024-01-31 DIAGNOSIS — N289 Disorder of kidney and ureter, unspecified: Secondary | ICD-10-CM

## 2024-01-31 DIAGNOSIS — D509 Iron deficiency anemia, unspecified: Secondary | ICD-10-CM

## 2024-01-31 DIAGNOSIS — Z87440 Personal history of urinary (tract) infections: Secondary | ICD-10-CM

## 2024-01-31 DIAGNOSIS — F419 Anxiety disorder, unspecified: Secondary | ICD-10-CM

## 2024-01-31 DIAGNOSIS — K219 Gastro-esophageal reflux disease without esophagitis: Secondary | ICD-10-CM

## 2024-01-31 DIAGNOSIS — K5904 Chronic idiopathic constipation: Secondary | ICD-10-CM | POA: Diagnosis not present

## 2024-01-31 DIAGNOSIS — Z7952 Long term (current) use of systemic steroids: Secondary | ICD-10-CM

## 2024-01-31 DIAGNOSIS — E43 Unspecified severe protein-calorie malnutrition: Secondary | ICD-10-CM | POA: Diagnosis not present

## 2024-01-31 NOTE — Telephone Encounter (Signed)
0

## 2024-01-31 NOTE — Telephone Encounter (Signed)
 Faxed and placed in folder and scan bin

## 2024-01-31 NOTE — Telephone Encounter (Signed)
 Form completed and returned to [place]

## 2024-01-31 NOTE — Telephone Encounter (Signed)
 Copied from CRM [id number]. Topic: Clinical - Home Health Verbal Orders >> Jan 30, 2024  4:53 PM Magdalene School wrote: Caller/Agency: [facility]  Callback Number: [phone] Service Requested: Skilled Nursing Frequency: Requesting to discharge from Skilled nursing. Any new concerns about the patient? No

## 2024-01-31 NOTE — Telephone Encounter (Signed)
 In your sign folder

## 2024-01-31 NOTE — Telephone Encounter (Signed)
 Faxed placed in folder and scan bin

## 2024-01-31 NOTE — Telephone Encounter (Signed)
 Okay to provide [facility] with verbal order to discharge from Skilled Nursing?

## 2024-01-31 NOTE — Telephone Encounter (Signed)
 Called and provided verbal d/c order

## 2024-01-31 NOTE — Telephone Encounter (Signed)
 Home Health orders was sent from [facility] and needs signatures and sent back to its designated place. Paper work is placed in providers box up front.  Please advise, Thanks

## 2024-01-31 NOTE — Telephone Encounter (Signed)
 Ok to d/c from skilled nursing

## 2024-02-01 ENCOUNTER — Other Ambulatory Visit: Payer: Self-pay | Admitting: Family Medicine

## 2024-02-02 ENCOUNTER — Other Ambulatory Visit: Payer: Self-pay

## 2024-02-02 ENCOUNTER — Telehealth: Payer: Self-pay

## 2024-02-02 MED ORDER — POTASSIUM CHLORIDE CRYS ER 20 MEQ PO TBCR: 20.0000 meq | EXTENDED_RELEASE_TABLET | Freq: Every day | ORAL | 1 refills | Status: DC

## 2024-02-03 ENCOUNTER — Telehealth: Payer: Self-pay

## 2024-02-03 NOTE — Telephone Encounter (Signed)
 Called home health and relayed verbal orders. Cecilia verbalized understanding.

## 2024-02-03 NOTE — Telephone Encounter (Signed)
 Home Health is requesting verbal orders to discharge patient from skilled nursing either Monday or Tuesday. Call back number is placed below.

## 2024-02-03 NOTE — Telephone Encounter (Signed)
 Copied from CRM [id number]. Topic: General - Other >> Feb 03, 2024  2:52 PM Annelle Kiel wrote: Reason for CRM: home  health nurse celiclia from [facility] is requesting verbal orders to discharge patient from skilled nursing on Monday or Tuesday call back number is b [phone]

## 2024-02-03 NOTE — Telephone Encounter (Signed)
 Ok for verbal orders ?

## 2024-02-06 ENCOUNTER — Telehealth: Payer: Self-pay | Admitting: Family Medicine

## 2024-02-06 NOTE — Telephone Encounter (Addendum)
 Home Health Certification or Plan of Care Tracking  Is this a Certification or Plan of Care? Plan of Care  [facility] Agency: [facility]  Order Number:  [id number], [id number], [id number]  Has charge sheet been attached? Yes  Where has form been placed:   Labeled & placed in provider bin

## 2024-02-06 NOTE — Telephone Encounter (Signed)
 Placed in your sign folder

## 2024-02-07 ENCOUNTER — Telehealth: Payer: Self-pay | Admitting: Family Medicine

## 2024-02-07 NOTE — Telephone Encounter (Signed)
 Home Health Certification or Plan of Care Tracking  Is this a Certification or Plan of Care? Plan of Care  [facility] Agency: [facility]  Order Number:  [id number]  Has charge sheet been attached? Yes  Where has form been placed:   Labeled & placed in provider bin

## 2024-02-07 NOTE — Telephone Encounter (Signed)
 faxed

## 2024-02-07 NOTE — Telephone Encounter (Signed)
 Faxed

## 2024-02-07 NOTE — Telephone Encounter (Signed)
 Forms signed and returned to [facility]

## 2024-02-08 NOTE — Telephone Encounter (Signed)
 Placed in your sign folder

## 2024-02-09 ENCOUNTER — Telehealth: Payer: Self-pay | Admitting: Family Medicine

## 2024-02-09 ENCOUNTER — Ambulatory Visit

## 2024-02-09 VITALS — BP 102/69 | HR 108 | Temp 98.2°F | Resp 16 | Ht 63.0 in | Wt 97.0 lb

## 2024-02-09 DIAGNOSIS — D509 Iron deficiency anemia, unspecified: Secondary | ICD-10-CM | POA: Diagnosis not present

## 2024-02-09 MED ORDER — SODIUM CHLORIDE 0.9 % IV SOLN
500.0000 mg | Freq: Once | INTRAVENOUS | Status: AC
  Administered 2024-02-09: 500 mg via INTRAVENOUS
  Filled 2024-02-09: qty 25

## 2024-02-09 NOTE — Telephone Encounter (Signed)
Form completed and returned to [facility]

## 2024-02-09 NOTE — Telephone Encounter (Signed)
 Received and faxed back.

## 2024-02-09 NOTE — Telephone Encounter (Signed)
 Home Health Certification or Plan of Care Tracking  Is this a Certification or Plan of Care? Plan of Care  [facility] Agency: [facility]  Order Number:  [id number]  Has charge sheet been attached? Yes  Where has form been placed:   Labeled & placed in provider bin

## 2024-02-09 NOTE — Telephone Encounter (Signed)
 Form signed and returned to [facility]

## 2024-02-09 NOTE — Progress Notes (Signed)
 Diagnosis: Iron  Deficiency Anemia  Provider:  Mannam, Praveen MD  Procedure: IV Infusion  IV Type: Peripheral, IV Location: L Forearm  Venofer  (Iron  Sucrose), Dose: 500 mg  Infusion Start Time: 0909  Infusion Stop Time: 1251  Post Infusion IV Care: Patient declined observation and Peripheral IV Discontinued  Discharge: Condition: Good, Destination: Home . AVS Declined  Performed by:  Rocky FORBES Sar, RN

## 2024-02-09 NOTE — Telephone Encounter (Signed)
Placed in your sign folder at the nurse station

## 2024-02-14 ENCOUNTER — Telehealth: Payer: Self-pay | Admitting: Family Medicine

## 2024-02-14 NOTE — Telephone Encounter (Signed)
 Home Health Certification or Plan of Care Tracking  Is this a Certification or Plan of Care? Plan of Care  [facility] Agency: [facility]  Order Number:  [id number]  Has charge sheet been attached? Yes  Where has form been placed:   Labeled & placed in provider bin

## 2024-02-14 NOTE — Telephone Encounter (Signed)
 Placed in folder at nurse station

## 2024-02-15 ENCOUNTER — Telehealth: Payer: Self-pay

## 2024-02-15 NOTE — Telephone Encounter (Signed)
 Faxed and placed in scan bin

## 2024-02-15 NOTE — Telephone Encounter (Signed)
 Copied from CRM [id number]. Topic: Clinical - Home Health Verbal Orders >> Feb 15, 2024 10:14 AM Viola FALCON wrote: Caller/Agency: Charisse from [facility]  Callback Number: [phone]  Service Requested: Physical Therapy Frequency: N/A Any new concerns about the patient? Yes, discharging from physical therapy. After an hour of standing activity blood pressure went to 127/101 then 136/103 (no symptoms). Patient decreased the midodrine  medication from  3x a day to 2x a day. She has an appointment tomorrow 02/16/24 with Dr. Mahlon.

## 2024-02-15 NOTE — Telephone Encounter (Signed)
 Form completed and returned to [place]

## 2024-02-16 ENCOUNTER — Ambulatory Visit: Admitting: Family Medicine

## 2024-02-16 VITALS — BP 102/62 | HR 101 | Temp 98.1°F | Ht 63.0 in | Wt 95.4 lb

## 2024-02-16 DIAGNOSIS — E538 Deficiency of other specified B group vitamins: Secondary | ICD-10-CM

## 2024-02-16 DIAGNOSIS — E539 Vitamin B deficiency, unspecified: Secondary | ICD-10-CM

## 2024-02-16 DIAGNOSIS — E43 Unspecified severe protein-calorie malnutrition: Secondary | ICD-10-CM | POA: Diagnosis not present

## 2024-02-16 LAB — CBC WITH DIFFERENTIAL/PLATELET
Basophils Absolute: 0 10*3/uL (ref 0.0–0.1)
Basophils Relative: 0.3 % (ref 0.0–3.0)
Eosinophils Absolute: 0.2 10*3/uL (ref 0.0–0.7)
Eosinophils Relative: 2 % (ref 0.0–5.0)
HCT: 36.5 % (ref 36.0–46.0)
Hemoglobin: 11.7 g/dL — ABNORMAL LOW (ref 12.0–15.0)
Lymphocytes Relative: 28.7 % (ref 12.0–46.0)
Lymphs Abs: 3.1 10*3/uL (ref 0.7–4.0)
MCHC: 32 g/dL (ref 30.0–36.0)
MCV: 83.2 fl (ref 78.0–100.0)
Monocytes Absolute: 1 10*3/uL (ref 0.1–1.0)
Monocytes Relative: 9.1 % (ref 3.0–12.0)
Neutro Abs: 6.5 10*3/uL (ref 1.4–7.7)
Neutrophils Relative %: 59.9 % (ref 43.0–77.0)
Platelets: 414 10*3/uL — ABNORMAL HIGH (ref 150.0–400.0)
RBC: 4.38 Mil/uL (ref 3.87–5.11)
RDW: 20.3 % — ABNORMAL HIGH (ref 11.5–15.5)
WBC: 10.9 10*3/uL — ABNORMAL HIGH (ref 4.0–10.5)

## 2024-02-16 LAB — BASIC METABOLIC PANEL WITH GFR
BUN: 19 mg/dL (ref 6–23)
CO2: 33 meq/L — ABNORMAL HIGH (ref 19–32)
Calcium: 9.3 mg/dL (ref 8.4–10.5)
Chloride: 99 meq/L (ref 96–112)
Creatinine, Ser: 0.68 mg/dL (ref 0.40–1.20)
GFR: 106.28 mL/min (ref >60.00)
Glucose, Bld: 69 mg/dL — ABNORMAL LOW (ref 70–99)
Potassium: 3.3 meq/L — ABNORMAL LOW (ref 3.5–5.1)
Sodium: 138 meq/L (ref 135–145)

## 2024-02-16 NOTE — Progress Notes (Signed)
   Subjective:    Patient ID: Stacy [name], female    DOB: June 24, 1981, 43 y.o.   MRN: [id number]  HPI Severe protein calorie malnutrition- weight is stable.  Pt is eating 3x/day plus some snacks in between.  She reports feeling stronger.  Coloring is better.  Abdomen remains distended which is embarrassing for her.  B12 deficiency- due for shot   Review of Systems For ROS see HPI     Objective:   Physical Exam Vitals reviewed.  Constitutional:      General: She is not in acute distress.    Appearance: She is well-developed. She is not ill-appearing.  HENT:     Head: Normocephalic and atraumatic.  Eyes:     Conjunctiva/sclera: Conjunctivae normal.     Pupils: Pupils are equal, round, and reactive to light.  Neck:     Thyroid : No thyromegaly.  Cardiovascular:     Rate and Rhythm: Normal rate and regular rhythm.     Heart sounds: Normal heart sounds. No murmur heard. Pulmonary:     Effort: Pulmonary effort is normal. No respiratory distress.     Breath sounds: Normal breath sounds.  Abdominal:     General: There is distension.     Palpations: Abdomen is soft.     Tenderness: There is no abdominal tenderness. There is no guarding or rebound.  Musculoskeletal:     Cervical back: Normal range of motion and neck supple.  Lymphadenopathy:     Cervical: No cervical adenopathy.  Skin:    General: Skin is warm and dry.  Neurological:     Mental Status: She is alert and oriented to person, place, and time.  Psychiatric:        Behavior: Behavior normal.           Assessment & Plan:

## 2024-02-16 NOTE — Patient Instructions (Addendum)
 Follow up in 6 weeks to recheck weight Schedule a B12 shot in 4 weeks Keep up the good work on regular eating!!! INCREASE GasX to twice daily Call with any questions or concerns HAPPY EARLY BIRTHDAY!!!

## 2024-02-20 ENCOUNTER — Ambulatory Visit: Payer: Self-pay | Admitting: Family Medicine

## 2024-02-27 ENCOUNTER — Telehealth: Payer: Self-pay | Admitting: Family Medicine

## 2024-02-27 NOTE — Telephone Encounter (Signed)
 Home Health Certification or Plan of Care Tracking  Is this a Certification or Plan of Care? Plan of Care  [facility] Agency: [facility]  Order Number:  [id number]  Has charge sheet been attached? Yes  Where has form been placed:   Labeled & placed in provider bin

## 2024-02-27 NOTE — Telephone Encounter (Signed)
 Faxed and placed in scan bin

## 2024-02-27 NOTE — Telephone Encounter (Signed)
 Placed in folder at nurse station

## 2024-02-27 NOTE — Telephone Encounter (Signed)
 Completed.

## 2024-02-28 NOTE — Assessment & Plan Note (Signed)
 Improving.  Pt is eating at least 3 meals/day w/ some snacks.  Coloring is better.  Weight is stable.  Abd remains distended but told her this will improve w/ time.  Repeat BMP to assess electrolytes.

## 2024-02-28 NOTE — Assessment & Plan Note (Signed)
 B12 injxn given

## 2024-03-01 DIAGNOSIS — N951 Menopausal and female climacteric states: Secondary | ICD-10-CM | POA: Insufficient documentation

## 2024-03-01 DIAGNOSIS — K631 Perforation of intestine (nontraumatic): Secondary | ICD-10-CM | POA: Insufficient documentation

## 2024-03-09 NOTE — Telephone Encounter (Signed)
 Unsure why there was no note attached

## 2024-03-22 ENCOUNTER — Ambulatory Visit (INDEPENDENT_AMBULATORY_CARE_PROVIDER_SITE_OTHER): Admitting: Family Medicine

## 2024-03-22 DIAGNOSIS — E539 Vitamin B deficiency, unspecified: Secondary | ICD-10-CM

## 2024-03-22 MED ORDER — CYANOCOBALAMIN 1000 MCG/ML IJ SOLN
1000.0000 ug | Freq: Once | INTRAMUSCULAR | Status: AC
  Administered 2024-03-22: 1000 ug via INTRAMUSCULAR

## 2024-03-22 NOTE — Progress Notes (Signed)
 Stacy [name] is a 43 y.o. female presents to the office today for B12 Injection per physician's orders. Injection was administered Intramuscular Left deltoid.   Patient's next injection due weekly, appt made? yes  Bascom GORMAN Collet

## 2024-03-23 ENCOUNTER — Other Ambulatory Visit: Payer: Self-pay

## 2024-03-23 MED ORDER — FLUDROCORTISONE ACETATE 0.1 MG PO TABS: 0.1000 mg | ORAL_TABLET | Freq: Two times a day (BID) | ORAL | 0 refills | Status: DC

## 2024-03-29 ENCOUNTER — Encounter: Payer: Self-pay | Admitting: Family Medicine

## 2024-03-29 ENCOUNTER — Other Ambulatory Visit (INDEPENDENT_AMBULATORY_CARE_PROVIDER_SITE_OTHER)

## 2024-03-29 ENCOUNTER — Ambulatory Visit: Admitting: Family Medicine

## 2024-03-29 VITALS — BP 100/70 | HR 104 | Temp 98.1°F | Wt 101.2 lb

## 2024-03-29 DIAGNOSIS — K5904 Chronic idiopathic constipation: Secondary | ICD-10-CM | POA: Diagnosis not present

## 2024-03-29 DIAGNOSIS — E43 Unspecified severe protein-calorie malnutrition: Secondary | ICD-10-CM

## 2024-03-29 DIAGNOSIS — K5989 Other specified functional intestinal disorders: Secondary | ICD-10-CM

## 2024-03-29 LAB — BASIC METABOLIC PANEL WITH GFR
BUN: 19 mg/dL (ref 6–23)
CO2: 31 meq/L (ref 19–32)
Calcium: 9.2 mg/dL (ref 8.4–10.5)
Chloride: 99 meq/L (ref 96–112)
Creatinine, Ser: 0.49 mg/dL (ref 0.40–1.20)
GFR: 114.92 mL/min (ref >60.00)
Glucose, Bld: 92 mg/dL (ref 70–99)
Potassium: 4.1 meq/L (ref 3.5–5.1)
Sodium: 137 meq/L (ref 135–145)

## 2024-03-29 LAB — HEPATIC FUNCTION PANEL
ALT: 11 U/L (ref 0–35)
AST: 10 U/L (ref 0–37)
Albumin: 4 g/dL (ref 3.5–5.2)
Alkaline Phosphatase: 82 U/L (ref 39–117)
Bilirubin, Direct: 0.1 mg/dL (ref 0.0–0.3)
Total Bilirubin: 0.7 mg/dL (ref 0.2–1.2)
Total Protein: 6.8 g/dL (ref 6.0–8.3)

## 2024-03-29 LAB — TSH: TSH: 0.42 u[IU]/mL (ref 0.35–5.50)

## 2024-03-29 LAB — CBC WITH DIFFERENTIAL/PLATELET
Basophils Absolute: 0 K/uL (ref 0.0–0.1)
Basophils Relative: 0.4 % (ref 0.0–3.0)
Eosinophils Absolute: 0 K/uL (ref 0.0–0.7)
Eosinophils Relative: 0.3 % (ref 0.0–5.0)
HCT: 37.5 % (ref 36.0–46.0)
Hemoglobin: 12.4 g/dL (ref 12.0–15.0)
Lymphocytes Relative: 31.3 % (ref 12.0–46.0)
Lymphs Abs: 2.9 K/uL (ref 0.7–4.0)
MCHC: 33 g/dL (ref 30.0–36.0)
MCV: 86.8 fl (ref 78.0–100.0)
Monocytes Absolute: 0.7 K/uL (ref 0.1–1.0)
Monocytes Relative: 7.2 % (ref 3.0–12.0)
Neutro Abs: 5.6 K/uL (ref 1.4–7.7)
Neutrophils Relative %: 60.8 % (ref 43.0–77.0)
Platelets: 360 K/uL (ref 150.0–400.0)
RBC: 4.31 Mil/uL (ref 3.87–5.11)
RDW: 16.9 % — ABNORMAL HIGH (ref 11.5–15.5)
WBC: 9.2 K/uL (ref 4.0–10.5)

## 2024-03-29 LAB — VITAMIN D 25 HYDROXY (VIT D DEFICIENCY, FRACTURES): VITD: 44.18 ng/mL (ref 30.00–100.00)

## 2024-03-29 NOTE — Progress Notes (Signed)
   Subjective:    Patient ID: Stacy [name], female    DOB: 04-05-1981, 43 y.o.   MRN: [id number]  HPI Severe protein calorie malnutrition- pt is up 6 lbs in 6 weeks.  Pt reports feeling great.  Pt has resumed working part time.  Is cooking and cleaning and running the kids around.  Pt reports eating is going well.  Normal BM's.    Chronic intestinal pseudo obstruction- ongoing.  They report they have not heard anything from Dr Dianna and they are still hoping to be pro-active rather than wait for the next serious issue and be reactive.  They would like a referral.   Review of Systems For ROS see HPI     Objective:   Physical Exam Vitals reviewed.  Constitutional:      General: She is not in acute distress.    Appearance: She is not ill-appearing.  HENT:     Head: Normocephalic and atraumatic.  Eyes:     Extraocular Movements: Extraocular movements intact.     Conjunctiva/sclera: Conjunctivae normal.  Pulmonary:     Effort: Pulmonary effort is normal. No respiratory distress.  Abdominal:     General: There is distension (significant abd distension).  Musculoskeletal:     Cervical back: Neck supple.  Skin:    General: Skin is warm and dry.  Neurological:     General: No focal deficit present.     Mental Status: She is alert and oriented to person, place, and time.  Psychiatric:        Mood and Affect: Mood normal.        Behavior: Behavior normal.        Thought Content: Thought content normal.           Assessment & Plan:

## 2024-03-29 NOTE — Assessment & Plan Note (Signed)
 Improving.  Pt has gained 6 lbs since last visit.  Applauded her efforts.  Check labs to ensure things are still in range.  Address any abnormalities if present.  Pt expressed understanding and is in agreement w/ plan.

## 2024-03-29 NOTE — Patient Instructions (Signed)
 Schedule your complete physical in 2 months We'll notify you of your lab results and make any changes if needed Continue to eat and drink regularly- I'm THRILLED w/ the weight gain! We'll call you to schedule w/ DR C Call with any questions or concerns Stay Safe!  Stay Healthy! KEEP UP THE GOOD WORK!

## 2024-03-29 NOTE — Assessment & Plan Note (Signed)
 Referral made to new GI doctor as pt and husband are very frustrated by lack of response from current provider.

## 2024-03-30 ENCOUNTER — Ambulatory Visit: Payer: Self-pay | Admitting: Family Medicine

## 2024-04-18 ENCOUNTER — Other Ambulatory Visit: Payer: Self-pay | Admitting: Family Medicine

## 2024-04-18 NOTE — Telephone Encounter (Signed)
 Copied from CRM [id number]. Topic: Clinical - Medication Refill >> Apr 18, 2024  2:54 PM Dedra B wrote: Medication: sucralfate  (CARAFATE ) 1 GM/10ML suspension (Expired)  Has the patient contacted their pharmacy? No, prescription expired   This is the patient's preferred pharmacy:  [id number] - [place], [place] - [address] [place] [address] Phone: [phone] Fax: [phone]  Is this the correct pharmacy for this prescription? Yes   Has the prescription been filled recently? No  Is the patient out of the medication? Yes  Has the patient been seen for an appointment in the last year OR does the patient have an upcoming appointment? Yes  Can we respond through MyChart? Yes  Agent: Please be advised that Rx refills may take up to 3 business days. We ask that you follow-up with your pharmacy.

## 2024-04-19 ENCOUNTER — Ambulatory Visit

## 2024-04-19 DIAGNOSIS — E538 Deficiency of other specified B group vitamins: Secondary | ICD-10-CM | POA: Diagnosis not present

## 2024-04-19 MED ORDER — CYANOCOBALAMIN 1000 MCG/ML IJ SOLN
1000.0000 ug | Freq: Once | INTRAMUSCULAR | Status: AC
  Administered 2024-04-19: 1000 ug via INTRAMUSCULAR

## 2024-04-19 NOTE — Progress Notes (Signed)
 Stacy [name] is a 43 y.o. female presents to the office today for B12 injection per physician's orders. Injection was administered Intramuscular Right deltoid.   Patient's next injection due in 1 month, appt made? no  [name]

## 2024-05-06 ENCOUNTER — Other Ambulatory Visit: Payer: Self-pay | Admitting: Family Medicine

## 2024-05-07 ENCOUNTER — Telehealth: Payer: Self-pay

## 2024-05-07 MED ORDER — SUCRALFATE 1 GM/10ML PO SUSP: 1.0000 g | Freq: Four times a day (QID) | ORAL | 0 refills | Status: DC

## 2024-05-07 NOTE — Telephone Encounter (Signed)
 Prescription sent to pharmacy.

## 2024-05-07 NOTE — Telephone Encounter (Signed)
 Copied from CRM #[id number]. Topic: Clinical - Medication Question >> May 07, 2024  8:46 AM Mesmerise C wrote: Reason for CRM: Patient checking status of medication for sucralfate  (CARAFATE ) 1 GM/10ML suspension advised was denied showing provided not at this practrice asked patient if she tried contacting the prescribing provider for refill, patient stated it was the hospital that she received it from and has no way to contact him for the refill

## 2024-05-07 NOTE — Telephone Encounter (Signed)
 Pt has been made aware and was appreciative

## 2024-05-07 NOTE — Telephone Encounter (Signed)
 Okay to refill sucralfate  (CARAFATE ) 1 GM/10ML suspension under your name?

## 2024-05-07 NOTE — Addendum Note (Signed)
 Addended by: Shyia Fillingim E on: 05/07/2024 03:00 PM   Modules accepted: Orders

## 2024-05-18 IMAGING — MG DIGITAL DIAGNOSTIC BILAT W/ TOMO W/ CAD
6 of 10 series · 6 of 30 positions shown · non-contrast
Comparison: Previous exam(s).
COMPARISON: Previous exam(s).

Addendum:
CLINICAL DATA: The patient was called back for a left breast mass.

EXAM:
DIGITAL DIAGNOSTIC BILATERAL MAMMOGRAM WITH TOMOSYNTHESIS AND CAD
TECHNIQUE: Bilateral digital diagnostic mammography and breast tomosynthesis
was performed. The images were evaluated with computer-aided
detection.

[L CC synth-2D (1 of 2)]
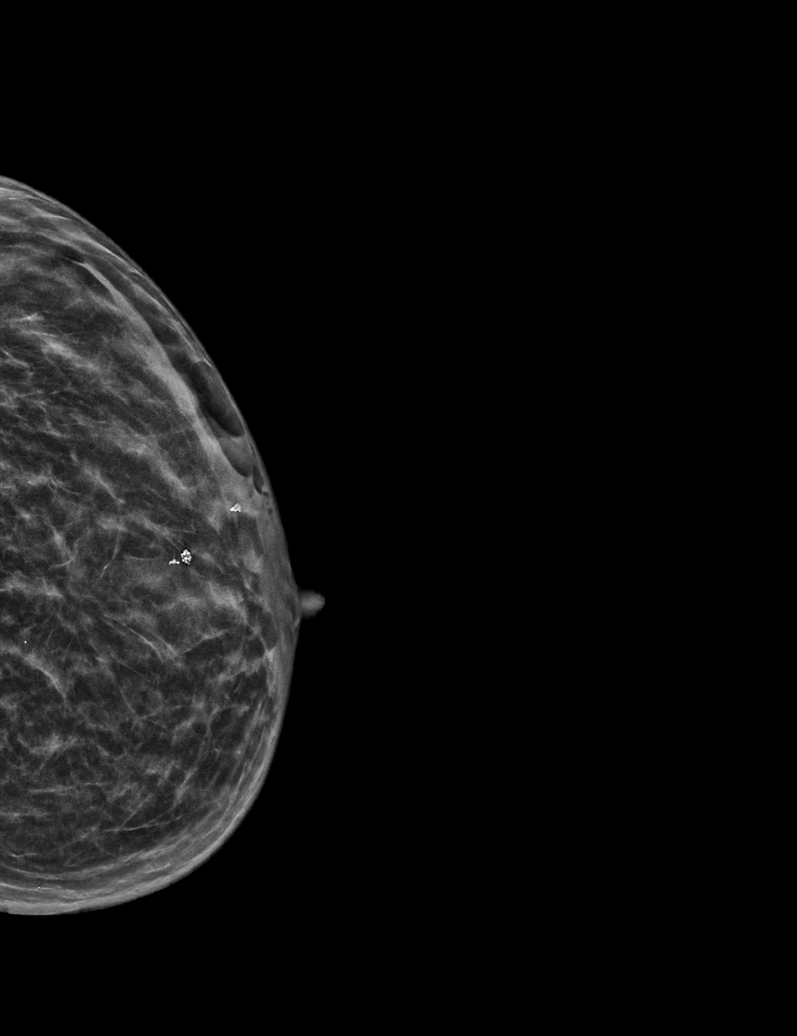

[L CC synth-2D (2 of 2)]
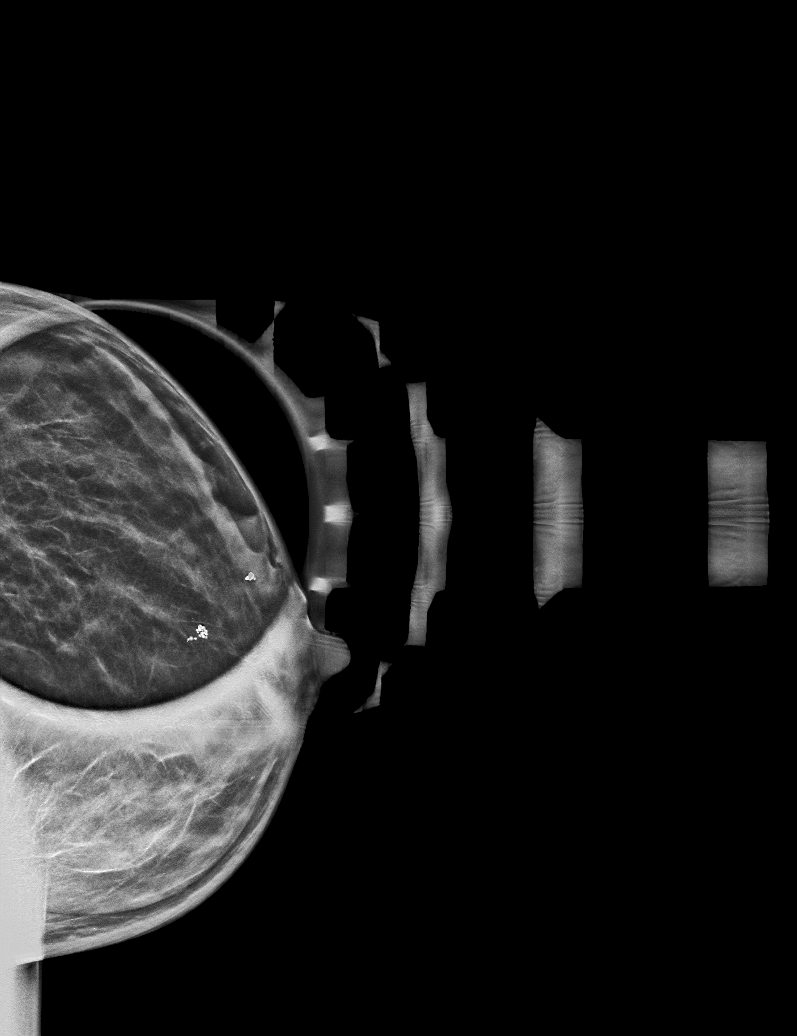

[R MLO synth-2D]
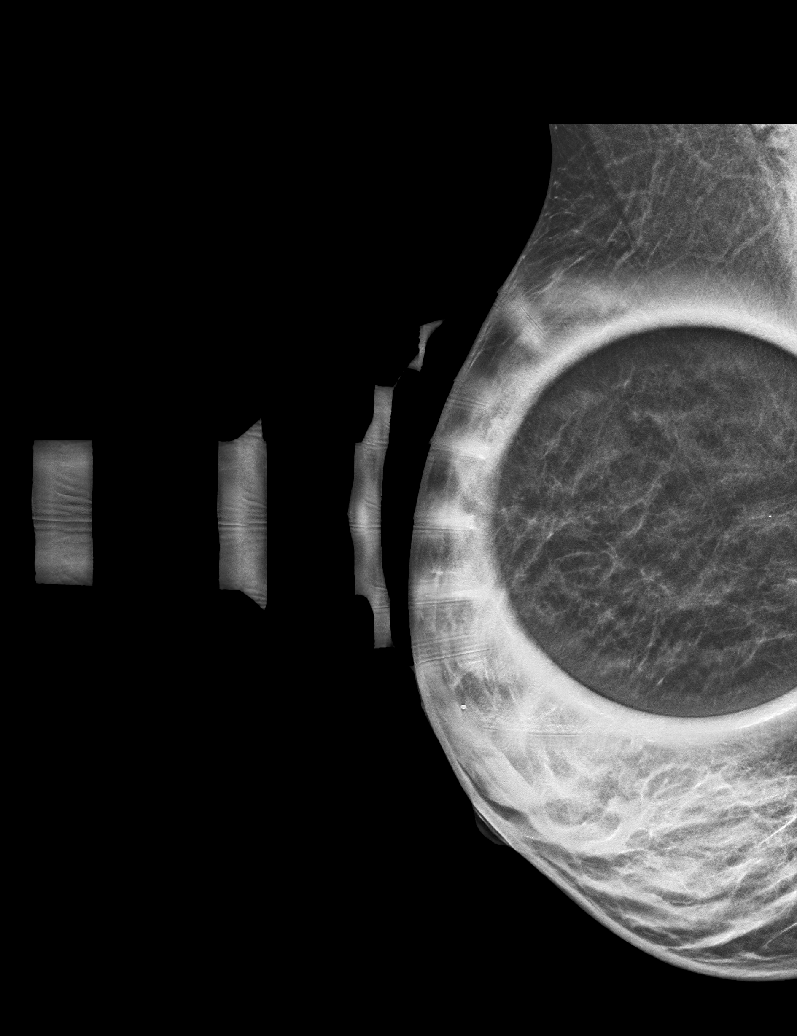

[R ML synth-2D]
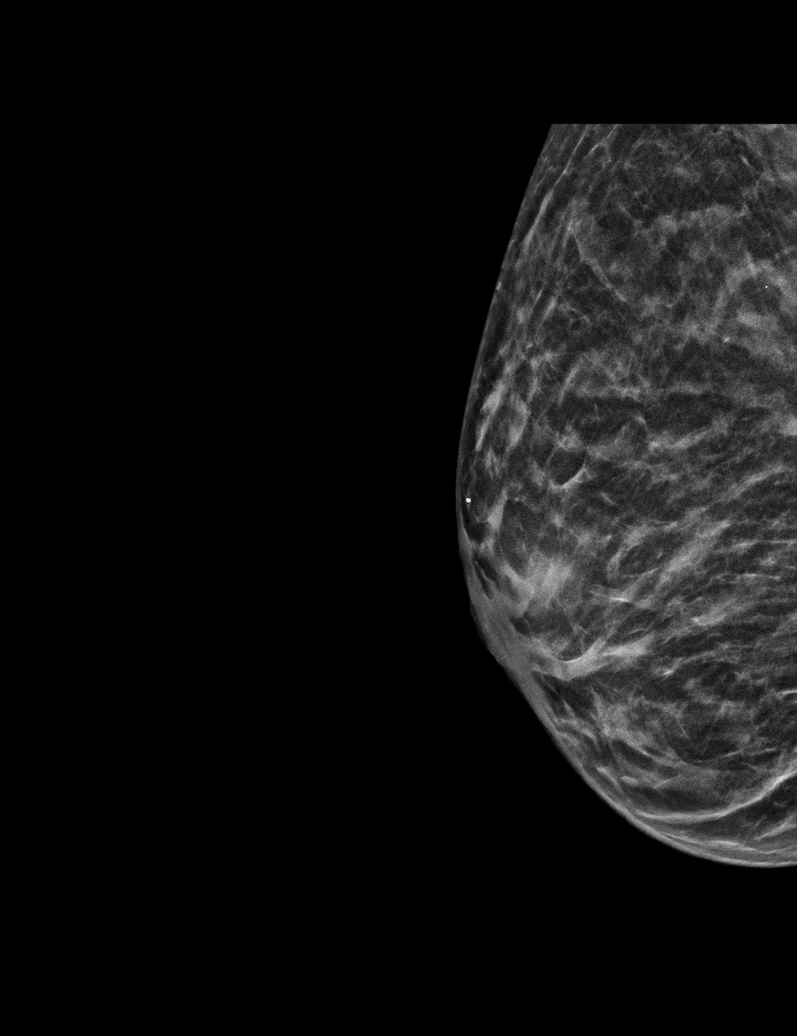

[L MLO synth-2D]
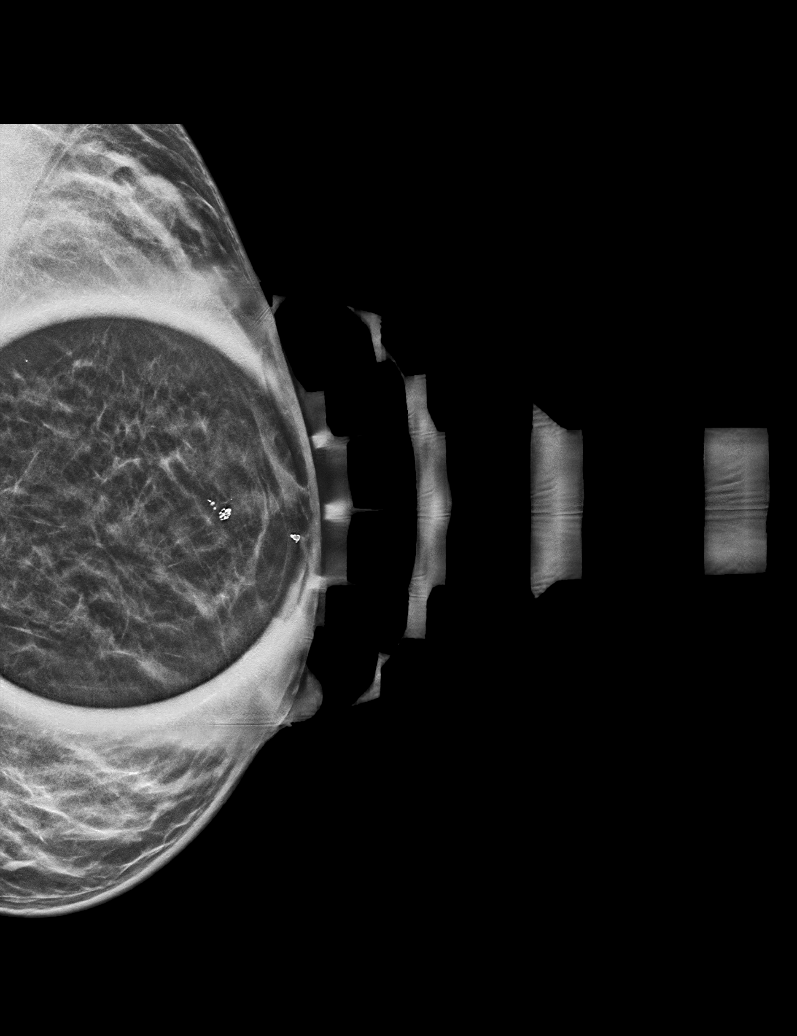

[R ML tomo · tomo slice 23/45.0]
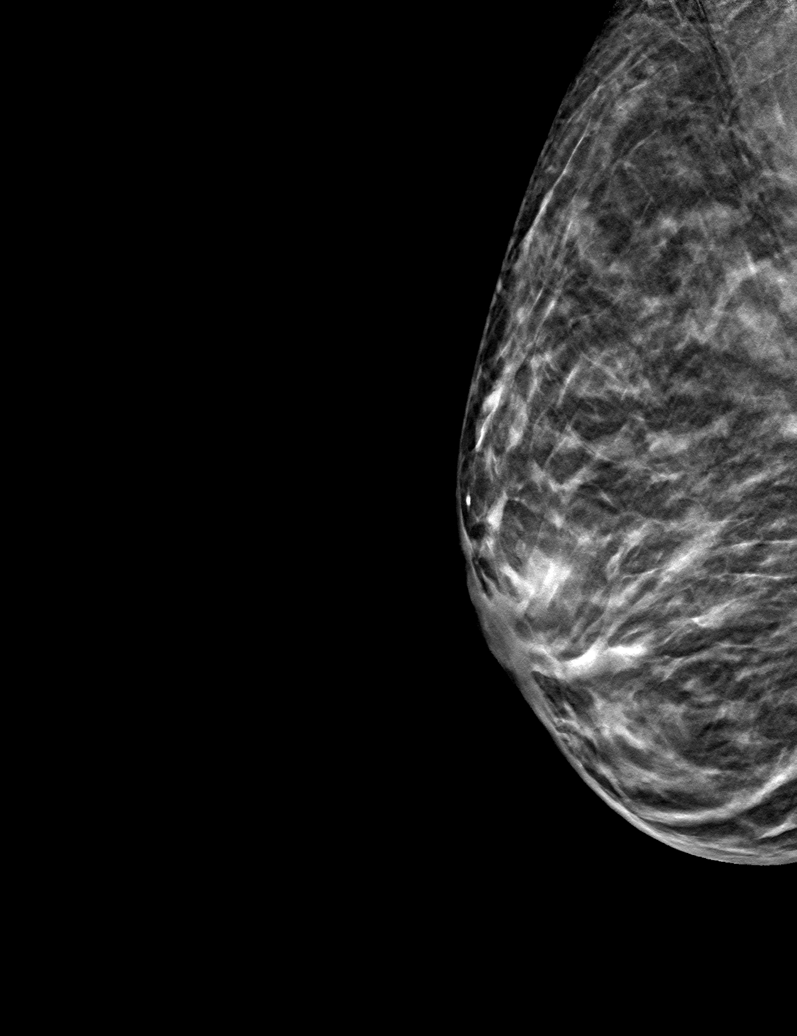

[6 of 30 positions shown; findings below may reference images not displayed]

ACR Breast Density Category c: The breast tissue is heterogeneously
dense, which may obscure small masses.
FINDINGS: A left breast mass has resolved in the interval.
IMPRESSION: Interval resolution of the left breast mass.

RECOMMENDATION:
Annual screening mammography.

I have discussed the findings and recommendations with the patient.
If applicable, a reminder letter will be sent to the patient
regarding the next appointment.

BI-RADS CATEGORY  1: Negative.

ADDENDUM:
The right asymmetry resolves on additional imaging.

*** End of Addendum ***
ACR Breast Density Category c: The breast tissue is heterogeneously
dense, which may obscure small masses.
FINDINGS: A left breast mass has resolved in the interval.
IMPRESSION: Interval resolution of the left breast mass.

RECOMMENDATION:
Annual screening mammography.

I have discussed the findings and recommendations with the patient.
If applicable, a reminder letter will be sent to the patient
regarding the next appointment.

BI-RADS CATEGORY  1: Negative.

## 2024-05-22 ENCOUNTER — Other Ambulatory Visit: Payer: Self-pay

## 2024-05-22 DIAGNOSIS — J0110 Acute frontal sinusitis, unspecified: Secondary | ICD-10-CM

## 2024-05-22 MED ORDER — MONTELUKAST SODIUM 10 MG PO TABS: 10.0000 mg | ORAL_TABLET | Freq: Every day | ORAL | 3 refills | Status: AC

## 2024-05-29 ENCOUNTER — Other Ambulatory Visit (HOSPITAL_BASED_OUTPATIENT_CLINIC_OR_DEPARTMENT_OTHER): Payer: Self-pay | Admitting: Cardiovascular Disease

## 2024-05-29 DIAGNOSIS — E861 Hypovolemia: Secondary | ICD-10-CM

## 2024-06-06 ENCOUNTER — Encounter: Payer: Self-pay | Admitting: Family Medicine

## 2024-06-06 ENCOUNTER — Telehealth: Payer: Self-pay | Admitting: Internal Medicine

## 2024-06-06 ENCOUNTER — Ambulatory Visit (INDEPENDENT_AMBULATORY_CARE_PROVIDER_SITE_OTHER): Admitting: Family Medicine

## 2024-06-06 VITALS — BP 98/64 | HR 94 | Temp 98.0°F | Ht 63.0 in | Wt 112.2 lb

## 2024-06-06 DIAGNOSIS — E559 Vitamin D deficiency, unspecified: Secondary | ICD-10-CM | POA: Diagnosis not present

## 2024-06-06 DIAGNOSIS — Z0000 Encounter for general adult medical examination without abnormal findings: Secondary | ICD-10-CM | POA: Diagnosis not present

## 2024-06-06 DIAGNOSIS — Z23 Encounter for immunization: Secondary | ICD-10-CM

## 2024-06-06 DIAGNOSIS — E538 Deficiency of other specified B group vitamins: Secondary | ICD-10-CM | POA: Diagnosis not present

## 2024-06-06 DIAGNOSIS — R636 Underweight: Secondary | ICD-10-CM | POA: Diagnosis not present

## 2024-06-06 LAB — LIPID PANEL
Cholesterol: 125 mg/dL (ref 0–200)
HDL: 43.4 mg/dL (ref >39.00)
LDL Cholesterol: 62 mg/dL (ref 0–99)
NonHDL: 81.57
Total CHOL/HDL Ratio: 3
Triglycerides: 96 mg/dL (ref 0.0–149.0)
VLDL: 19.2 mg/dL (ref 0.0–40.0)

## 2024-06-06 LAB — B12 AND FOLATE PANEL
Folate: 22.9 ng/mL (ref >5.9)
Vitamin B-12: >1500 pg/mL — ABNORMAL HIGH (ref 211–911)

## 2024-06-06 LAB — CBC WITH DIFFERENTIAL/PLATELET
Basophils Absolute: 0 K/uL (ref 0.0–0.1)
Basophils Relative: 0.4 % (ref 0.0–3.0)
Eosinophils Absolute: 0.1 K/uL (ref 0.0–0.7)
Eosinophils Relative: 0.7 % (ref 0.0–5.0)
HCT: 40.5 % (ref 36.0–46.0)
Hemoglobin: 13.3 g/dL (ref 12.0–15.0)
Lymphocytes Relative: 35.5 % (ref 12.0–46.0)
Lymphs Abs: 3.2 K/uL (ref 0.7–4.0)
MCHC: 32.9 g/dL (ref 30.0–36.0)
MCV: 93.2 fl (ref 78.0–100.0)
Monocytes Absolute: 0.7 K/uL (ref 0.1–1.0)
Monocytes Relative: 7.6 % (ref 3.0–12.0)
Neutro Abs: 5 K/uL (ref 1.4–7.7)
Neutrophils Relative %: 55.8 % (ref 43.0–77.0)
Platelets: 322 K/uL (ref 150.0–400.0)
RBC: 4.35 Mil/uL (ref 3.87–5.11)
RDW: 12.2 % (ref 11.5–15.5)
WBC: 9 K/uL (ref 4.0–10.5)

## 2024-06-06 LAB — VITAMIN B12: Vitamin B-12: >1500 pg/mL — ABNORMAL HIGH (ref 211–911)

## 2024-06-06 LAB — TSH: TSH: 1.03 u[IU]/mL (ref 0.35–5.50)

## 2024-06-06 LAB — BASIC METABOLIC PANEL WITH GFR
BUN: 19 mg/dL (ref 6–23)
CO2: 31 meq/L (ref 19–32)
Calcium: 9.2 mg/dL (ref 8.4–10.5)
Chloride: 99 meq/L (ref 96–112)
Creatinine, Ser: 0.63 mg/dL (ref 0.40–1.20)
GFR: 108.02 mL/min (ref >60.00)
Glucose, Bld: 74 mg/dL (ref 70–99)
Potassium: 3.5 meq/L (ref 3.5–5.1)
Sodium: 138 meq/L (ref 135–145)

## 2024-06-06 LAB — VITAMIN D 25 HYDROXY (VIT D DEFICIENCY, FRACTURES): VITD: 28.41 ng/mL — ABNORMAL LOW (ref 30.00–100.00)

## 2024-06-06 LAB — HEPATIC FUNCTION PANEL
ALT: 18 U/L (ref 0–35)
AST: 17 U/L (ref 0–37)
Albumin: 4.2 g/dL (ref 3.5–5.2)
Alkaline Phosphatase: 64 U/L (ref 39–117)
Bilirubin, Direct: 0.2 mg/dL (ref 0.0–0.3)
Total Bilirubin: 0.7 mg/dL (ref 0.2–1.2)
Total Protein: 6.6 g/dL (ref 6.0–8.3)

## 2024-06-06 NOTE — Telephone Encounter (Signed)
 I have spoken to both patient and her husband to advise that Dr Albertus has reviewed her records and feels it most appropriate for her to follow with [facility] for 2nd opinion. I have provided them with the phone number to reach [facility] as well. They verbalize understanding.

## 2024-06-06 NOTE — Telephone Encounter (Signed)
 Good afternoon Dr. Albertus  The following patient is reaching out for a second opinion for Chronic intestinal pseudo-obstruction and  Chronic idiopathic constipation. The patient is currently no happy with [facility] and wishes to start under your care. Records are available with care everywhere. Please review and advise of scheduling. Thank you.

## 2024-06-06 NOTE — Telephone Encounter (Signed)
 Given the clinical nature of this response I am including nursing  Chart reviewed, [facility] had recommended [facility] which seems most appropriate for this patient for her second opinion

## 2024-06-06 NOTE — Patient Instructions (Signed)
 Follow up in 6 months to recheck weight, BP, abd issues We'll notify you of your lab results and make any changes if needed Keep up the good work!  You look great! Call with any questions or concerns Stay Safe!  Stay Healthy! Happy Fall!!!

## 2024-06-06 NOTE — Progress Notes (Signed)
   Subjective:    Patient ID: Stacy [name], female    DOB: 07/25/1981, 43 y.o.   MRN: [id number]  HPI CPE- UTD on mammo (requesting records), Tdap.  Will get flu today. UTD on pap and colonoscopy.  Patient Care Team    Relationship Specialty Notifications Start End  Mahlon Comer BRAVO, MD PCP - General Family Medicine  02/01/23   Dianna Specking, MD Consulting Physician Gastroenterology  06/27/17   Lilton Legions, DO Consulting Physician Obstetrics and Gynecology  06/27/17   Merced Annella HERO, [place] Consulting Physician Gastroenterology  04/12/19   Reche Darice Enter, MD Referring Physician Gastroenterology  07/17/19   Gastroenterology, Margarete    12/22/23   Lonn Hicks, MD Consulting Physician Hematology and Oncology  01/24/24      Health Maintenance  Topic Date Due  . Hepatitis B Vaccines 19-59 Average Risk (1 of 3 - 19+ 3-dose series) Never done  . HPV VACCINES (1 - 3-dose SCDM series) Never done  . Mammogram  01/06/2024  . Influenza Vaccine  03/16/2024  . DTaP/Tdap/Td (2 - Td or Tdap) 06/02/2025  . Cervical Cancer Screening (HPV/Pap Cotest)  12/12/2026  . Hepatitis C Screening  Completed  . HIV Screening  Completed  . Pneumococcal Vaccine  Aged Out  . Meningococcal B Vaccine  Aged Out  . COVID-19 Vaccine  Discontinued      Review of Systems Patient reports no vision/ hearing changes, adenopathy,fever, weight change,  persistant/recurrent hoarseness , swallowing issues, chest pain, palpitations, edema, persistant/recurrent cough, hemoptysis, dyspnea (rest/exertional/paroxysmal nocturnal), gastrointestinal bleeding (melena, rectal bleeding), abdominal pain, significant heartburn, bowel changes, GU symptoms (dysuria, hematuria, incontinence), Gyn symptoms (abnormal  bleeding, pain),  syncope, focal weakness, memory loss, numbness & tingling, skin/hair/nail changes, abnormal bruising or bleeding, anxiety, or depression.     Objective:   Physical Exam General Appearance:     Alert, cooperative, no distress, appears stated age  Head:    Normocephalic, without obvious abnormality, atraumatic  Eyes:    PERRL, conjunctiva/corneas clear, EOM's intact both eyes  Ears:    Normal TM's and external ear canals, both ears  Nose:   Nares normal, septum midline, mucosa normal, no drainage    or sinus tenderness  Throat:   Lips, mucosa, and tongue normal; teeth and gums normal  Neck:   Supple, symmetrical, trachea midline, no adenopathy;    Thyroid : no enlargement/tenderness/nodules  Back:     Symmetric, no curvature, ROM normal, no CVA tenderness  Lungs:     Clear to auscultation bilaterally, respirations unlabored  Chest Wall:    No tenderness or deformity   Heart:    Regular rate and rhythm, S1 and S2 normal, no murmur, rub   or gallop  Breast Exam:    Deferred to GYN  Abdomen:     Soft, non-tender, bowel sounds active all four quadrants,    no masses, no organomegaly  Genitalia:    Deferred to GYN  Rectal:    Extremities:   Extremities normal, atraumatic, no cyanosis or edema  Pulses:   2+ and symmetric all extremities  Skin:   Skin color, texture, turgor normal, no rashes or lesions  Lymph nodes:   Cervical, supraclavicular, and axillary nodes normal  Neurologic:   CNII-XII intact, normal strength, sensation and reflexes    throughout          Assessment & Plan:

## 2024-06-07 ENCOUNTER — Ambulatory Visit: Payer: Self-pay | Admitting: Family Medicine

## 2024-06-10 NOTE — Assessment & Plan Note (Signed)
 Pt's PE WNL.  Regaining weight.  UTD on mammo, pap, colonoscopy, Tdap.  Flu shot given.  Check labs.  Anticipatory guidance provided.

## 2024-06-30 ENCOUNTER — Other Ambulatory Visit: Payer: Self-pay | Admitting: Family Medicine

## 2024-07-05 ENCOUNTER — Ambulatory Visit (INDEPENDENT_AMBULATORY_CARE_PROVIDER_SITE_OTHER): Admitting: Family Medicine

## 2024-07-05 DIAGNOSIS — E538 Deficiency of other specified B group vitamins: Secondary | ICD-10-CM

## 2024-07-05 MED ORDER — CYANOCOBALAMIN 1000 MCG/ML IJ SOLN
1000.0000 ug | Freq: Once | INTRAMUSCULAR | Status: AC
  Administered 2024-08-08: 1000 ug via INTRAMUSCULAR

## 2024-07-05 NOTE — Progress Notes (Signed)
 Stacy [name] is a 43 y.o. female presents in office today for a nurse visit for B12 Injection.   Patient Injection was given in the  Left deltoid. Patient tolerated injection well.   Patient's next injection due 4 wks, appt made? yes  Bascom GORMAN Collet

## 2024-07-09 ENCOUNTER — Other Ambulatory Visit: Payer: Self-pay

## 2024-07-09 DIAGNOSIS — E861 Hypovolemia: Secondary | ICD-10-CM

## 2024-07-10 MED ORDER — MIDODRINE HCL 5 MG PO TABS: 5.0000 mg | ORAL_TABLET | Freq: Three times a day (TID) | ORAL | 0 refills | Status: AC

## 2024-07-20 ENCOUNTER — Other Ambulatory Visit: Payer: Self-pay | Admitting: Family Medicine

## 2024-07-26 ENCOUNTER — Other Ambulatory Visit: Payer: Self-pay

## 2024-07-26 MED ORDER — POTASSIUM CHLORIDE CRYS ER 20 MEQ PO TBCR: 20.0000 meq | EXTENDED_RELEASE_TABLET | Freq: Every day | ORAL | 1 refills | Status: AC

## 2024-08-08 ENCOUNTER — Ambulatory Visit

## 2024-08-08 DIAGNOSIS — E538 Deficiency of other specified B group vitamins: Secondary | ICD-10-CM | POA: Diagnosis not present

## 2024-08-08 NOTE — Progress Notes (Signed)
 Stacy [name] is a 43 y.o. female presents in office today for a nurse visit for B12 Injection.   Patient Injection was given in the  Right deltoid. Patient tolerated injection well.   Patient's next injection due 4 weeks, appt made? no  [address] Khyra Viscuso

## 2024-08-26 ENCOUNTER — Other Ambulatory Visit: Payer: Self-pay | Admitting: Family Medicine

## 2024-08-27 ENCOUNTER — Encounter: Payer: Self-pay | Admitting: Family Medicine

## 2024-09-13 ENCOUNTER — Ambulatory Visit: Admitting: Family Medicine

## 2024-09-13 ENCOUNTER — Encounter: Payer: Self-pay | Admitting: Family Medicine

## 2024-09-13 ENCOUNTER — Ambulatory Visit

## 2024-09-13 VITALS — BP 104/72 | HR 99 | Temp 97.9°F | Resp 18 | Ht 63.0 in | Wt 123.6 lb

## 2024-09-13 DIAGNOSIS — R051 Acute cough: Secondary | ICD-10-CM

## 2024-09-13 DIAGNOSIS — J3489 Other specified disorders of nose and nasal sinuses: Secondary | ICD-10-CM

## 2024-09-13 DIAGNOSIS — J069 Acute upper respiratory infection, unspecified: Secondary | ICD-10-CM | POA: Diagnosis not present

## 2024-09-13 DIAGNOSIS — E538 Deficiency of other specified B group vitamins: Secondary | ICD-10-CM

## 2024-09-13 MED ORDER — CYANOCOBALAMIN 1000 MCG/ML IJ SOLN
1000.0000 ug | Freq: Once | INTRAMUSCULAR | Status: AC
  Administered 2024-09-13: 1000 ug via INTRAMUSCULAR

## 2024-09-13 MED ORDER — AMOXICILLIN-POT CLAVULANATE 875-125 MG PO TABS: 1.0000 | ORAL_TABLET | Freq: Two times a day (BID) | ORAL | 0 refills | Status: AC

## 2024-09-13 MED ORDER — GUAIFENESIN-CODEINE 100-10 MG/5ML PO SOLN: 5.0000 mL | Freq: Every evening | ORAL | 0 refills | Status: AC | PRN

## 2024-09-13 NOTE — Progress Notes (Signed)
 "  Subjective:  Patient ID: Stacy [name], female    DOB: [birth year]  Age: 44 y.o. MRN: [id number]  CC:  Chief Complaint  Patient presents with   Acute Visit    Congestion. Sinus pressure and pain. HA. Runny nose. Mucus is yellow. Cough. Ears are popping. No one is sick around her. Sx started monday. Covid/flu test negative.     HPI Stacy [name] presents for  Acute visit for above, PCP is Dr. Mahlon  Sinus pressure, cough, congestion, runny nose Symptoms started 4 nights. ago. HA, nasal congestion.  Sinus pressure, pain, congestion, runny nose and yellow phlegm and nasal congestion Some associated cough and ears popping.  No known recent sick contacts.  Oldest son with flu few weeks ago. Home testing for COVID, flu negative few days into illness.  Feels better each day, but feels worse in am, better as day progresses.  No recent travel.  No rash.  No recent abx No hx of asthma.   Attempted treatments Alka seltzer in am, nyquil at night. Cough still keeping up at night.   On xifaxin and neomycin past week for SIBO?    Additionally due for B12 injection today.  History of B12 deficiency, last injections  07/05/2024, 08/08/2024, 4-week repeat planned.  Standing order for 1000 mcg every 30 days. 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   Anxiety 09/25/2020   Chronic idiopathic constipation 09/25/2020   Weight loss 09/25/2020   Adrenal insufficiency 09/16/2020   Amenorrhea 07/21/2020   Chronic orthostatic hypotension    Hypophosphatemia    Hypomagnesemia    Bloating    Nausea and vomiting 04/28/2020   Physical exam 01/01/2020   Generalized abdominal pain 04/25/2019   Hyponatremia 04/25/2019   Hypokalemia 04/25/2019   Severe  protein-calorie malnutrition 04/25/2019   Gastroesophageal reflux disease 04/07/2019   Normocytic anemia 04/07/2019   Chronic intestinal pseudo-obstruction 09/05/2018   Partial obstruction of small intestine (HCC) 05/16/2018   Iron  deficiency anemia 01/22/2018   Vitamin B deficiency 01/22/2018   Vitamin D  deficiency 01/22/2018   Underweight 06/27/2017   Cyst of ovary 10/05/2016   Hypotension 07/27/2013   Hepatic adenoma 07/27/2013   Past Medical History:  Diagnosis Date   Anxiety    Benign liver cyst    Chronic intestinal pseudo-obstruction    GERD (gastroesophageal reflux disease)    History of UTI    Nausea and vomiting 04/28/2020   Past Surgical History:  Procedure Laterality Date   BIOPSY  05/20/2020   Procedure: BIOPSY;  Surgeon: Lennard Lesta FALCON, MD;  Location: [facility];  Service: Endoscopy;;   CESAREAN SECTION     twice 2010, 2013   CHOLECYSTECTOMY     ESOPHAGOGASTRODUODENOSCOPY (EGD) WITH PROPOFOL  N/A 05/20/2020   Procedure: ESOPHAGOGASTRODUODENOSCOPY (EGD) WITH PROPOFOL ;  Surgeon: Lennard Lesta FALCON, MD;  Location: [facility];  Service: Endoscopy;  Laterality: N/A;   Esure  ~2015   LAPAROTOMY N/A 10/29/2023   Procedure: LAPAROTOMY, EXPLORATORY; RIGHT EXTENDED COLECTOMY;  Surgeon: Signe Mitzie LABOR, MD;  Location: [facility];  Service: General;  Laterality: N/A;   LAPAROTOMY N/A 10/31/2023   Procedure: RE-EXPLORATION OF ABDOMEN, CHOLECYSTECTOMY, ILEOCOLONIC ANASTOMOSIS;  Surgeon: Tanda Locus, MD;  Location: [facility];  Service: General;  Laterality: N/A;   SMALL INTESTINE SURGERY  [address]   Allergies[1] Prior to Admission medications  Medication Sig Start Date End Date Taking? Authorizing Provider  acetaminophen  (TYLENOL ) 500 MG tablet Take 2 tablets (1,000 mg total) by mouth every 6 (six) hours as needed for mild pain (pain score 1-3), fever or headache. 11/15/23  Yes Rosalba Glendale DEL, PA-C  cyanocobalamin  (,VITAMIN B-12,) 1000 MCG/ML injection Inject 1,000 mcg into the  muscle every 30 (thirty) days.   Yes [provider]  fludrocortisone  (FLORINEF ) 0.1 MG tablet Take 1 tablet (0.1 mg total) by mouth 2 (two) times daily. 03/23/24  Yes Mahlon Comer BRAVO, MD  megestrol  (MEGACE  ES) 625 MG/5ML suspension SHAKE LIQUID AND TAKE 5 ML(625 MG) BY MOUTH DAILY 08/27/24  Yes Tabori, Katherine E, MD  metoCLOPramide  (REGLAN ) 10 MG tablet Take 1 tablet (10 mg total) by mouth 3 (three) times daily with meals. 01/03/24  Yes Arlon Carliss ORN, DO  midodrine  (PROAMATINE ) 5 MG tablet Take 1 tablet (5 mg total) by mouth 3 (three) times daily with meals. 07/10/24  Yes Raford Riggs, MD  montelukast  (SINGULAIR ) 10 MG tablet Take 1 tablet (10 mg total) by mouth daily. 05/22/24  Yes Tabori, Katherine E, MD  neomycin (MYCIFRADIN) 500 MG tablet Take 1,000 mg by mouth 4 (four) times daily.   Yes [provider]  ondansetron  (ZOFRAN -ODT) 4 MG disintegrating tablet Take 1 tablet (4 mg total) by mouth every 8 (eight) hours as needed for nausea or vomiting. 12/08/23  Yes Mahlon Comer BRAVO, MD  pantoprazole  (PROTONIX ) 40 MG tablet Take 1 tablet (40 mg total) by mouth daily. 12/13/23  Yes Zackowski, Scott, MD  polyethylene glycol (MIRALAX  / GLYCOLAX ) 17 g packet Take 17 g by mouth 3 (three) times daily. 05/28/20  Yes Sebastian Toribio GAILS, MD  potassium chloride  SA (KLOR-CON  M) 20 MEQ tablet Take 1 tablet (20 mEq total) by mouth daily. 07/26/24  Yes Tabori, Katherine E, MD  rifaximin (XIFAXAN) 550 MG TABS tablet Take 550 mg by mouth 3 (three) times daily. 08/27/24 09/17/24 Yes [provider]  sucralfate  (CARAFATE ) 1 GM/10ML suspension TAKE 10 MLS BY MOUTH FOUR TIMES DAILY 07/02/24  Yes Tabori, Katherine E, MD  traZODone  (DESYREL ) 50 MG tablet Take 0.5-1 tablets (25-50 mg total) by mouth at bedtime as needed for sleep. 12/08/23  Yes Tabori, Katherine E, MD  Vitamin D , Ergocalciferol , (DRISDOL ) 1.25 MG (50000 UNIT) CAPS capsule TAKE 1 CAPSULE BY MOUTH EVERY 7 DAYS 02/01/24  Yes  Tabori, Katherine E, MD  Wheat Dextrin (BENEFIBER DRINK MIX PO) Take by mouth. 1 tbsp of 2grams of powder.   Yes [provider]   Social History   Socioeconomic History   Marital status: Married    Spouse name: Emad   Number of children: 2   Years of education: Not on file   Highest education level: Associate degree: academic program  Occupational History   Occupation: stay home ; associate degree paralega   Tobacco Use   Smoking status: Never   Smokeless tobacco: Never  Vaping Use   Vaping status: Never Used  Substance and Sexual Activity   Alcohol use: No    Comment: socially    Drug use: No   Sexual activity: Yes    Birth control/protection: None  Other Topics Concern   Not on file  Social History Narrative   Household: pt, husband , 2 children   2 boys: 2010, 2013   P2G2   Original  from [place] , no foreign trips   Social Drivers of Health  Tobacco Use: Low Risk (06/06/2024)   Patient History    Smoking Tobacco Use: Never    Smokeless Tobacco Use: Never    Passive Exposure: Not on file  Financial Resource Strain: Low Risk (06/04/2024)   Overall Financial Resource Strain (CARDIA)    Difficulty of Paying Living Expenses: Not hard at all  Food Insecurity: Low Risk (08/07/2024)   Received from [facility]   Epic    Within the past 12 months, you worried that your food would run out before you got money to buy more: Never true    Within the past 12 months, the food you bought just didn't last and you didn't have money to get more. : Never true  Transportation Needs: No Transportation Needs (08/07/2024)   Received from [facility]    In the past 12 months, has lack of reliable transportation kept you from medical appointments, meetings, work or from getting things needed for daily living? : No  Physical Activity: Insufficiently Active (06/04/2024)   Exercise Vital Sign    Days of Exercise per Week: 2 days    Minutes of Exercise per  Session: 30 min  Stress: No Stress Concern Present (06/04/2024)   [employer] of Occupational Health - Occupational Stress Questionnaire    Feeling of Stress: Not at all  Social Connections: Moderately Integrated (06/04/2024)   Social Connection and Isolation Panel    Frequency of Communication with Friends and Family: More than three times a week    Frequency of Social Gatherings with Friends and Family: Twice a week    Attends Religious Services: More than 4 times per year    Active Member of [employer] or Organizations: No    Attends Banker Meetings: Not on file    Marital Status: Married  Catering Manager Violence: Not At Risk (01/04/2024)   Humiliation, Afraid, Rape, and Kick questionnaire    Fear of Current or Ex-Partner: No    Emotionally Abused: No    Physically Abused: No    Sexually Abused: No  Depression (PHQ2-9): Low Risk (06/06/2024)   Depression (PHQ2-9)    PHQ-2 Score: 0  Alcohol Screen: Low Risk (06/04/2024)   Alcohol Screen    Last Alcohol Screening Score (AUDIT): 2  Housing: Low Risk (08/07/2024)   Received from [facility]   Epic    What is your living situation today?: I have a steady place to live    Think about the place you live. Do you have problems with any of the following? Choose all that apply:: None/None on this list  Utilities: Low Risk (08/07/2024)   Received from [facility]   Utilities    In the past 12 months has the electric, gas, oil, or water  company threatened to shut off services in your home? : No  Health Literacy: Not on file    Review of Systems Per HPI  Objective:   Vitals:   09/13/24 0855  BP: 104/72  Pulse: 99  Resp: 18  Temp: 97.9 F (36.6 C)  TempSrc: Temporal  SpO2: 98%  Weight: 123 lb 9.6 oz (56.1 kg)  Height: 5' 3 (1.6 m)     Physical Exam Vitals reviewed.  Constitutional:      General: She is not in acute distress.    Appearance: She is well-developed.  HENT:     Head: Normocephalic and  atraumatic.     Right Ear: Hearing, tympanic membrane, ear canal and external ear normal.  Left Ear: Hearing, tympanic membrane, ear canal and external ear normal.     Nose: Nose normal.     Comments: Bilateral maxillary greater than frontal sinus pressure with percussion.    Mouth/Throat:     Pharynx: No posterior oropharyngeal erythema.  Eyes:     Conjunctiva/sclera: Conjunctivae normal.     Pupils: Pupils are equal, round, and reactive to light.  Cardiovascular:     Rate and Rhythm: Normal rate and regular rhythm.     Heart sounds: Normal heart sounds. No murmur heard. Pulmonary:     Effort: Pulmonary effort is normal. No respiratory distress.     Breath sounds: Normal breath sounds. No wheezing or rhonchi.  Skin:    General: Skin is warm and dry.     Findings: No rash.  Neurological:     Mental Status: She is alert and oriented to person, place, and time.  Psychiatric:        Mood and Affect: Mood normal.        Behavior: Behavior normal.        Assessment & Plan:  Fiorella Hanahan is a 44 y.o. female . Sinus pressure - Plan: amoxicillin -clavulanate (AUGMENTIN ) 875-125 MG tablet Acute cough - Plan: guaiFENesin -codeine  100-10 MG/5ML syrup Upper respiratory tract infection, unspecified type  -  Suspected viral illness versus early sinusitis.  Based on timing of symptoms and slight improvement each day, decided to treat for viral symptoms at this time with saline nasal spray, Flonase if needed, Mucinex  and codeine  cough syrup if needed at night with potential side effects discussed.  I did print an antibiotic for Augmentin  if sinus symptoms are not improving into next week, but would want to be cautious due to possible side effects with this and possible impact on her gastrointestinal symptoms, current use of Xifaxan as above.  I did recommend she discuss with her gastroenterologist if Augmentin  needed.  RTC precautions given.  All questions answered.  B12 deficiency  -  Standing order for B12 injection monthly, given today.  Meds ordered this encounter  Medications   guaiFENesin -codeine  100-10 MG/5ML syrup    Sig: Take 5-10 mLs by mouth at bedtime as needed.    Dispense:  120 mL    Refill:  0   amoxicillin -clavulanate (AUGMENTIN ) 875-125 MG tablet    Sig: Take 1 tablet by mouth 2 (two) times daily.    Dispense:  20 tablet    Refill:  0   Patient Instructions  Thank you for coming in today.  Unfortunately I do think you have a viral infection which is causing the cough, congestion and sinus pressure.  I recommend Mucinex  over-the-counter during the day, saline nasal spray or saline nasal irrigation for the congestion, codeine  cough syrup was prescribed if needed at night.  Flonase can be used short-term if needed for nasal congestion or swelling.  If symptoms or not improving through the weekend into early next week, I did prescribe Augmentin  for possible sinus infection.  However as we discussed that could certainly impact your gastrointestinal symptoms and would recommend discussing that with your gastroenterologist given your other medications you are currently taking.  Make sure to get plenty of rest, drink plenty of fluids, be seen if any new or worsening symptoms, and take care!     Signed,   Reyes Pines, MD [facility], [facility] 09/13/24 9:40 AM       [1]  Allergies Allergen Reactions   Tetracyclines &  Related Swelling    Swelling in spine; required spinal tap.    Erythromycin Other (See Comments)    Swelling in spine; required spinal tap   Tetracaine Other (See Comments)   Raspberry Rash   Sulfa Antibiotics Rash    hives   Sulfonamide Derivatives Rash    Reaction to cream   "

## 2024-09-13 NOTE — Patient Instructions (Addendum)
 Thank you for coming in today.  Unfortunately I do think you have a viral infection which is causing the cough, congestion and sinus pressure.  I recommend Mucinex  over-the-counter during the day, saline nasal spray or saline nasal irrigation for the congestion, codeine  cough syrup was prescribed if needed at night.  Flonase can be used short-term if needed for nasal congestion or swelling.  If symptoms or not improving through the weekend into early next week, I did prescribe Augmentin  for possible sinus infection.  However as we discussed that could certainly impact your gastrointestinal symptoms and would recommend discussing that with your gastroenterologist given your other medications you are currently taking.  Make sure to get plenty of rest, drink plenty of fluids, be seen if any new or worsening symptoms, and take care!

## 2024-09-15 ENCOUNTER — Encounter: Payer: Self-pay | Admitting: Family Medicine

## 2024-09-20 ENCOUNTER — Other Ambulatory Visit: Payer: Self-pay | Admitting: Family Medicine

## 2024-10-11 ENCOUNTER — Ambulatory Visit

## 2024-10-23 ENCOUNTER — Encounter: Payer: 59 | Admitting: Family Medicine

## 2024-12-06 ENCOUNTER — Ambulatory Visit: Admitting: Family Medicine
